# Patient Record
Sex: Female | Born: 1940 | Race: White | Hispanic: No | Smoking: Current every day smoker
Health system: Southern US, Community
[De-identification: ages and names within clinical notes are randomized; demographics above are authoritative.]

## PROBLEM LIST (undated history)

## (undated) DIAGNOSIS — M75101 Unspecified rotator cuff tear or rupture of right shoulder, not specified as traumatic: Secondary | ICD-10-CM

## (undated) DIAGNOSIS — K5900 Constipation, unspecified: Secondary | ICD-10-CM

## (undated) DIAGNOSIS — C801 Malignant (primary) neoplasm, unspecified: Secondary | ICD-10-CM

## (undated) DIAGNOSIS — M75102 Unspecified rotator cuff tear or rupture of left shoulder, not specified as traumatic: Secondary | ICD-10-CM

## (undated) DIAGNOSIS — H269 Unspecified cataract: Secondary | ICD-10-CM

## (undated) HISTORY — PX: LUMBAR LAMINECTOMY: SHX95

## (undated) HISTORY — DX: Unspecified cataract: H26.9

---
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
## 1977-10-08 HISTORY — PX: VAGINAL HYSTERECTOMY: SUR661

## 1981-10-08 HISTORY — PX: LUMBAR SPINE SURGERY: SHX701

## 2002-08-25 ENCOUNTER — Encounter: Payer: Self-pay | Admitting: Family Medicine

## 2002-08-25 ENCOUNTER — Encounter: Admission: RE | Admit: 2002-08-25 | Discharge: 2002-08-25 | Payer: Self-pay | Admitting: Family Medicine

## 2002-10-08 ENCOUNTER — Emergency Department (HOSPITAL_COMMUNITY): Admission: EM | Admit: 2002-10-08 | Discharge: 2002-10-08 | Payer: Self-pay | Admitting: Emergency Medicine

## 2010-10-08 HISTORY — PX: CATARACT EXTRACTION W/ INTRAOCULAR LENS  IMPLANT, BILATERAL: SHX1307

## 2010-11-21 ENCOUNTER — Other Ambulatory Visit: Payer: Self-pay | Admitting: Orthopedic Surgery

## 2010-11-21 ENCOUNTER — Encounter (HOSPITAL_COMMUNITY): Payer: Medicare Other

## 2010-11-21 DIAGNOSIS — Z01812 Encounter for preprocedural laboratory examination: Secondary | ICD-10-CM | POA: Insufficient documentation

## 2010-11-21 LAB — CBC
HCT: 39.5 % (ref 36.0–46.0)
Hemoglobin: 14.1 g/dL (ref 12.0–15.0)
MCH: 33.1 pg (ref 26.0–34.0)
MCHC: 35.7 g/dL (ref 30.0–36.0)
MCV: 92.7 fL (ref 78.0–100.0)
Platelets: 230 10*3/uL (ref 150–400)
RBC: 4.26 MIL/uL (ref 3.87–5.11)
RDW: 12.3 % (ref 11.5–15.5)
WBC: 8.9 10*3/uL (ref 4.0–10.5)

## 2010-11-21 LAB — SURGICAL PCR SCREEN
MRSA, PCR: NEGATIVE
Staphylococcus aureus: NEGATIVE

## 2010-11-28 ENCOUNTER — Inpatient Hospital Stay (HOSPITAL_COMMUNITY)
Admission: RE | Admit: 2010-11-28 | Discharge: 2010-11-29 | DRG: 497 | Disposition: A | Discharge status: Home / Self Care | Payer: Medicare Other | Source: Ambulatory Visit | Attending: Orthopedic Surgery | Admitting: Orthopedic Surgery

## 2010-11-28 ENCOUNTER — Inpatient Hospital Stay (HOSPITAL_COMMUNITY): Admission: RE | Admit: 2010-11-28 | Payer: Medicare Other | Source: Home / Self Care | Admitting: Orthopedic Surgery

## 2010-11-28 DIAGNOSIS — S43499A Other sprain of unspecified shoulder joint, initial encounter: Secondary | ICD-10-CM | POA: Diagnosis present

## 2010-11-28 DIAGNOSIS — M75120 Complete rotator cuff tear or rupture of unspecified shoulder, not specified as traumatic: Principal | ICD-10-CM | POA: Diagnosis present

## 2010-11-28 DIAGNOSIS — Z01812 Encounter for preprocedural laboratory examination: Secondary | ICD-10-CM

## 2010-11-28 DIAGNOSIS — F17200 Nicotine dependence, unspecified, uncomplicated: Secondary | ICD-10-CM | POA: Diagnosis present

## 2010-11-28 DIAGNOSIS — M19019 Primary osteoarthritis, unspecified shoulder: Secondary | ICD-10-CM | POA: Diagnosis present

## 2010-11-28 HISTORY — PX: OTHER SURGICAL HISTORY: SHX169

## 2010-12-01 NOTE — Op Note (Signed)
  NAME:  Brittany Marks, Brittany Marks             ACCOUNT NO.:  [id number]  MEDICAL RECORD NO.:  [id number]           PATIENT TYPE:  I  LOCATION:  1619                         FACILITY:  [facility]  PHYSICIAN:  Marlowe Kays, M.D.  DATE OF BIRTH:  1940-11-06  DATE OF PROCEDURE: DATE OF DISCHARGE:                              OPERATIVE REPORT   ADDENDUM:  After conclusion of the rotator cuff repair on the right shoulder, following a Betadine prep, I then injected the subacromial space on her left shoulder with 80 mg of Depo-Medrol and 5 cc of 0.5% Marcaine plain without a complication.          ______________________________ Marlowe Kays, M.D.     JA/MEDQ  D:  11/28/2010  T:  11/29/2010  Job:  [id number]  Electronically Signed by Marlowe Kays M.D. on 12/01/2010 04:26:34 PM

## 2010-12-01 NOTE — Op Note (Signed)
[name], Brittany Marks             ACCOUNT NO.:  [id number]  MEDICAL RECORD NO.:  [id number]           PATIENT TYPE:  I  LOCATION:  1619                         FACILITY:  [facility]  PHYSICIAN:  Marlowe Kays, M.D.  DATE OF BIRTH:  1941/09/04  DATE OF PROCEDURE:  11/28/2010 DATE OF DISCHARGE:                              OPERATIVE REPORT   PREOPERATIVE DIAGNOSES: 1. Torn labrum. 2. Partial biceps tendon tear, intra-articular versus extra-articular. 3. Complete rotator cuff tear with retraction. 4. Acromioclavicular joint arthritis, right shoulder.  POSTOPERATIVE DIAGNOSES: 1. Torn labrum. 2. Partial biceps tendon tear, intra-articular versus extra-articular. 3. Complete rotator cuff tear with retraction. 4. Acromioclavicular joint arthritis, right shoulder.  OPERATION: 1. Right shoulder arthroscopy with debridement of extensive tearing of     the labrum and debridement/resection of residual intra-articular     biceps tendon tear. 2. Open distal clavicle resection. 3. Open anterior acromionectomy and repair of torn rotator cuff.  SURGEON:  Marlowe Kays, M.D.  ASSISTANTDruscilla Brownie. Cherlynn June.  ANESTHESIA:  General.  PATHOLOGY/JUSTIFICATION FOR PROCEDURE:  All the injuries to her shoulder occurred following a fall with an MRI demonstrating preoperative diagnoses.  Also, at the time of surgery, she requested that I inject her left shoulder with steroids because it was painful as well.  DESCRIPTION OF PROCEDURE:  Preoperative interscalene block by Anesthesia, satisfactory general anesthesia, beach-chair position on sliding frame.  Right shoulder was girdle was prepped with DuraPrep and draped in sterile field.  Time-out performed.  I marked out the anatomy of the shoulder and for hemostatic purposes I infiltrated the proposed surgical incision site and posterior portal with 0.5% Marcaine with adrenaline.  Through a posterior soft spot portal, I entered  the glenohumeral joint.  She had an unusual amount of bleeding on the puncture, which I felt was atraumatic, but she had a large amount of vascular tissue in the joint, which was quite distorted by her pathology.  It was really hard to make out definite tissue planes.  I was able identify the humeral head and the adjacent labrum.  I could not see a definite biceps tendon, although there was a big water tissue at the location where the biceps would having attached.  Consequently, I estimated the appropriate placement to advance the scope anteriorly and with a Switching Stick made an anterior incision.  I then placed a 4.2 shaver into the joint and then began cleaning up the very distorted tissue including the very degenerated torn labrum and as it turns out remnant of the biceps tendon.  I then used a 4.2 [place] shaver for more aggressiveness.  I completed the resection/removal and there was no longer any mechanical impingement in the joint.  I then exited the glenohumeral joint removing all fluid possible and made an open incision over the distal clavicle and curving downward over the rotator cuff. The ceased joint was identified and subperiosteal dissection.  I isolated the distal clavicle and measured a 1.5 cm from the joint.  With small Key elevator, I undermined the clavicle at this point and using two baby Hohmann retractors, I protected the underlying tissue as  I used a microsaw to amputate the clavicle at this point.  Small spicules of remaining bone are removed with small rongeur.  I then covered the raw bone with bone wax.  I then continued distally with subperiosteal dissection using cutting cautery isolating the anterior acromion, which I then undermined with a small Cobb elevator and did my initial bony resection with a microsaw.  I followed this with a second minor bone resections that any impingement problems would completely eradicated.  I then inspected and found the  rotator cuff, which was as noted on the MRI significantly retracted.  It was also quite thinned out.  A nubbin biceps tendon was also noted over the posterior half underneath the rotator cuff and I extracted that with a small rongeur.  She was also noted to have a number of cystic changes in the craters in the lateral humeral head, which I also pictured.  I then began packing down the rotator cuff starting from anteriorly using multiple rotator cuff anchors.  I was unable to reattach the rotator cuff at its normal anatomic location, but it was under minimal tension just medial to it. The big concern was the integrity of the rotator cuff.  Having reattached the rotator cuff, we irrigated the wounds well.  I then placed Gelfoam in the resection site of the distal clavicle and then closed the wound with interrupted #1 Vicryl and the fascia overlying the distal clavicle resection and over the anterior acromion as well as repair of the deltoid.  Subcutaneous tissue was then closed with combination of a 1-0 and 2-0 Vicryl and Steri-Strips on the skin.  Also, after the arthroscopic procedure, I closed these 2 portals with 4-0 nylon and applied an Ioban, which was removed at this time as well.  Dry sterile dressing applied followed by shoulder immobilizer.  She tolerated the procedure well and was taken to Recovery in satisfactory condition with no known complications.          ______________________________ Marlowe Kays, M.D.     JA/MEDQ  D:  11/28/2010  T:  11/29/2010  Job:  [id number]  Electronically Signed by Marlowe Kays M.D. on 12/01/2010 04:26:31 PM

## 2011-01-09 NOTE — Discharge Summary (Signed)
  NAMEALLENA, Brittany Marks             ACCOUNT NO.:  [id number]  MEDICAL RECORD NO.:  [id number]           PATIENT TYPE:  I  LOCATION:  1619                         FACILITY:  [facility]  PHYSICIAN:  Marlowe Kays, M.D.  DATE OF BIRTH:  1940-11-12  DATE OF ADMISSION:  11/28/2010 DATE OF DISCHARGE:  11/29/2010                              DISCHARGE SUMMARY   ADMITTING DIAGNOSES: 1. Torn labrum, right shoulder. 2. Partial biceps tendon tear, intraarticular versus extraarticular. 3. Complete rotator cuff tear with retraction. 4. Acromioclavicular joint arthritis, right shoulder.  DISCHARGE DIAGNOSES: 1. Torn labrum, right shoulder. 2. Partial biceps tendon tear, intraarticular versus extraarticular. 3. Complete rotator cuff tear with retraction. 4. Acromioclavicular joint arthritis, right shoulder.  OPERATIONS: 1. On November 28, 2010, the patient underwent right shoulder     arthroscopy with debridement of extensive tearing of the labrum and     debridement resection of residual intra-articular biceps tendon     tear. 2. Open distal clavicle resection. 3. Open anterior acromionectomy and repair of torn rotator cuff.  ASSISTANT:  Dooley L. Cherlynn June.  BRIEF HISTORY:  The patient had a fall in the not too distant past and MRI showed the preoperative diagnosis listed above.  At the time of the surgery, we injected her left shoulder for its pain as well.  COURSE IN HOSPITAL:  The patient tolerated overnight stay.  Vital signs remained intact and stable.  She was placed in a sling with waist stabilizer, which she tolerated quite well.  On the morning of discharge, she showed good neurovascular in the operative extremity.  It is felt, she could be maintained in a home environment, arrangements were made for discharge.  She continue with her home medications, which was ibuprofen 200 mg 3 times a day, diclofenac 75 mg p.o. twice daily, and tramadol 50 mg q.6 h. as needed.  She was  discharged to home in good condition, to follow in their office in 2-3 days for dressing change and then 2 weeks after that.  All questions encouraged and answered.     Dooley L. Cherlynn June.   ______________________________ Marlowe Kays, M.D.    DLU/MEDQ  D:  12/28/2010  T:  12/29/2010  Job:  [id number]  Electronically Signed by Marlowe Kays M.D. on 01/09/2011 03:38:38 PM

## 2011-01-10 ENCOUNTER — Other Ambulatory Visit: Payer: Self-pay | Admitting: Orthopedic Surgery

## 2011-01-10 ENCOUNTER — Ambulatory Visit (HOSPITAL_BASED_OUTPATIENT_CLINIC_OR_DEPARTMENT_OTHER)
Admission: RE | Admit: 2011-01-10 | Discharge: 2011-01-10 | Disposition: A | Discharge status: Home / Self Care | Payer: Federal, State, Local not specified - PPO | Source: Ambulatory Visit | Attending: Orthopedic Surgery | Admitting: Orthopedic Surgery

## 2011-01-10 DIAGNOSIS — F17200 Nicotine dependence, unspecified, uncomplicated: Secondary | ICD-10-CM | POA: Insufficient documentation

## 2011-01-10 DIAGNOSIS — I998 Other disorder of circulatory system: Secondary | ICD-10-CM | POA: Insufficient documentation

## 2011-01-10 DIAGNOSIS — Y838 Other surgical procedures as the cause of abnormal reaction of the patient, or of later complication, without mention of misadventure at the time of the procedure: Secondary | ICD-10-CM | POA: Insufficient documentation

## 2011-01-10 DIAGNOSIS — Z01812 Encounter for preprocedural laboratory examination: Secondary | ICD-10-CM | POA: Insufficient documentation

## 2011-01-10 DIAGNOSIS — T8130XA Disruption of wound, unspecified, initial encounter: Secondary | ICD-10-CM | POA: Insufficient documentation

## 2011-01-10 HISTORY — PX: OTHER SURGICAL HISTORY: SHX169

## 2011-01-11 LAB — POCT HEMOGLOBIN-HEMACUE: Hemoglobin: 14.5 g/dL (ref 12.0–15.0)

## 2011-01-12 LAB — WOUND CULTURE: Culture: NO GROWTH

## 2011-01-15 LAB — ANAEROBIC CULTURE

## 2011-01-24 NOTE — Op Note (Signed)
  NAMEALEXARAE, Brittany Marks             ACCOUNT NO.:  [id number]  MEDICAL RECORD NO.:  [id number]           PATIENT TYPE:  LOCATION:                                 FACILITY:  PHYSICIAN:  Marlowe Kays, M.D.  DATE OF BIRTH:  [birth year]  DATE OF PROCEDURE:  01/10/2011 DATE OF DISCHARGE:                              OPERATIVE REPORT   PREOPERATIVE DIAGNOSIS:  Aseptic wound dehisced, superior right shoulder.  POSTOPERATIVE DIAGNOSIS:  Aseptic wound dehisced, superior right shoulder.  OPERATION:  Debridement, irrigation, and closure of wounds, superior right shoulder.  SURGEON:  Marlowe Kays, MD  ASSISTANT:  Nurse.  ANESTHESIA:  General.  JUSTIFICATION FOR PROCEDURE:  I performed open rotator cuff repair as well as distal clavicle excision on November 28, 2010.  Over the last week, we noted a blister formed over the lateral portion of the incision which on opening sterilely with a needle, little bit of fluid came forth which we have cultured and has been negative so far, but she has had significant persistent drainage and I was able to palpate a gap in the deeper tissue structures leading to her coming here today.  PROCEDURE IN DETAIL:  After satisfied general anesthesia, she was noted to have a large resolving area of ecchymosis on her lateral left shoulder where she is known to have a rotator cuff tear there as well. Beach-chair position, right shoulder girdle was prepped with DuraPrep and draped in sterile field.  All this was done with her arm to her side.  A time-out performed, I then draped throughout and squared off fashion and excised the area of the wound which had soft tissue coming forth out of it and we sent this specimen to Microbiology for culture. I followed the wound down debriding up with sharp dissection and small rongeur down to what appeared to be the underlying acromion.  Again there did not appear to be any purulent material.  The wound was seen to be  sealed off laterally towards the rotator cuff area of repair.  After irrigating the wound thoroughly, I closed the deep structures with interrupted #1 Vicryl, subcutaneous tissue with 2-0 Vicryl, and interrupted 4-0 nylon sutures in the skin. Betadine, Adaptic, dry sterile dressings were applied followed by shoulder immobilizer, again protecting her arm for any motion.  At the time of this dictation, she was on her way to recovery room in satisfactory condition with no known complications.          ______________________________ Marlowe Kays, M.D.     JA/MEDQ  D:  01/10/2011  T:  01/11/2011  Job:  [id number]  Electronically Signed by Marlowe Kays M.D. on 01/24/2011 07:35:10 AM

## 2011-04-17 NOTE — H&P (Addendum)
[name], Marks             ACCOUNT NO.:  [id number]  MEDICAL RECORD NO.:  [id number]          PATIENT TYPE:  AMB  LOCATION:  DAY                          FACILITY:  [facility]  PHYSICIAN:  Brittany Marks, M.D.  DATE OF BIRTH:  [birth year]  DATE OF ADMISSION: DATE OF DISCHARGE:                             HISTORY & PHYSICAL   CHIEF COMPLAINT:  "Pain in my right shoulder."  PRESENT ILLNESS:  This 70 year old white female has been seen by [place] for progressive and persistent problems concerning her right shoulder.  She also has similar problems with the left shoulder, but the right is far more symptomatic.  She has pain with range of motion with difficulty reaching out and up.  The MRI of September 23, 2010 showed a considerable amount of pathology in the shoulder.  She has a full and partial- thickness, humeral chondral loss, degenerative labral tear with a high- grade partial full-thickness long head of the biceps tear as well.  A 3.8 anterior to posterior full thickness rotator cuff tear was seen in the right shoulder with 3.7 cm of retraction.  She also has moderate AC joint hypertrophy.  Due to the severity of her current disease, it was felt that she would need a surgical procedure.  She agreed because this will probability not heal by itself and certainly has not to this point. She is being scheduled for a right shoulder scope with repair of the rotator cuff, distal clavicle resection, subacromial decompression as well.  All the risks and benefits of surgery have been described to her.  When seen today, she had a considerable amount of questions concerning the para surgical plans.  I attempted to answer as many as I could.  PAST MEDICAL HISTORY:  She has been in relatively good health throughout her lifetime.  She has no medical allergies.  CURRENT MEDICATIONS: 1. Diclofenac (which she will stop prior to surgery) alternating with     ibuprofen (which she will stop prior to  surgery). 2. She also uses tramadol for pain which seems to help her     considerably.  PAST SURGICAL HISTORY: 1. She has had bilateral cataract surgery. 2. Back surgery back in the 70s and done well with that. 3. She has also had a hysterectomy in 1979. 4. She had a different level of lumbar surgery done in December 1983. 5. She had mumps and measles as a child.  FAMILY HISTORY:  Positive heart disease with father dying at 50 years of age, heart disease with the mother dying in 61 years of age.  SOCIAL HISTORY:  The patient is divorced, retired.  She is attempting to stop smoking as she has smoked for 40+ years.  (I reminded her about healing interference with smokers).  The patient lives alone but she has a daughter and her friend to help her after surgery.  REVIEW OF SYSTEMS:  CNS:  No seizures, stroke, paralysis, numbness, or double vision.  RESPIRATORY:  No productive cough.  No hemoptysis or shortness of breath.  CARDIOVASCULAR:  No chest pain or angina or orthopnea.  GASTROINTESTINAL:  No nausea, vomiting, melena, or bloody stool.  GENITOURINARY:  No discharge, dysuria, or hematuria. MUSCULOSKELETAL:  Primarily in present illness with pain on range of motion of the right shoulder.  PHYSICAL EXAMINATION:  GENERAL:  Alert and cooperative friendly 70 year old, 5 feet 2-1/2 inch, 164 pounds white female accompanied by her daughter. VITAL SIGNS:  Blood pressure 120/58 left arm seated, pulses 70 and regular, respirations 12 and unlabored. HEENT:  Normocephalic.  PERRLA.  EOM  intact.  Oropharynx is clear. CHEST:  Clear to auscultation.  No rhonchi or rales. HEART:  Regular rate and rhythm.  No murmurs are heard. ABDOMEN:  Soft, nontender.  Liver and spleen not felt. GENITALIA,  RECTAL,  PELVIC,  BREASTS:  Not done, per present illness. EXTREMITIES:  Right shoulder seen with pain on range of motion particularly on abduction.  Good pulses and sensation to the hand.  ADMISSION  DIAGNOSIS: 1. Right shoulder degenerative osteoarthritis. 2. Tear of the rotator cuff. 3. Impingement with acromioclavicular arthrosis to the right shoulder.  PLAN:  We will do an open rotator cuff repair, distal clavicle resection.  She will also have arthroscopy with debridement of the labrum and biceps tendon.     Brittany Marks.   ______________________________ Brittany Marks, M.D.    DLU/MEDQ  D:  11/08/2010  T:  11/08/2010  Job:  [id number]  Electronically Signed by Brittany Marks M.D. on 11/13/2010 02:49:09 PM

## 2011-11-14 ENCOUNTER — Other Ambulatory Visit: Payer: Self-pay | Admitting: Orthopedic Surgery

## 2011-11-20 ENCOUNTER — Encounter (HOSPITAL_BASED_OUTPATIENT_CLINIC_OR_DEPARTMENT_OTHER): Payer: Self-pay | Admitting: *Deleted

## 2011-11-21 ENCOUNTER — Encounter (HOSPITAL_BASED_OUTPATIENT_CLINIC_OR_DEPARTMENT_OTHER): Payer: Self-pay | Admitting: *Deleted

## 2011-11-21 NOTE — Progress Notes (Signed)
NPO AFTER MN. ARRIVES AT 1030. NEEDS HH AND EKG.

## 2011-11-27 ENCOUNTER — Ambulatory Visit (HOSPITAL_BASED_OUTPATIENT_CLINIC_OR_DEPARTMENT_OTHER)
Admission: RE | Admit: 2011-11-27 | Payer: Federal, State, Local not specified - PPO | Source: Ambulatory Visit | Admitting: Orthopedic Surgery

## 2011-11-27 ENCOUNTER — Encounter (HOSPITAL_BASED_OUTPATIENT_CLINIC_OR_DEPARTMENT_OTHER): Admission: RE | Payer: Self-pay | Source: Ambulatory Visit

## 2011-11-27 HISTORY — DX: Unspecified rotator cuff tear or rupture of right shoulder, not specified as traumatic: M75.101

## 2011-11-27 SURGERY — REPAIR, ROTATOR CUFF, OPEN
Anesthesia: General | Laterality: Right

## 2011-12-05 ENCOUNTER — Other Ambulatory Visit: Payer: Self-pay | Admitting: Orthopedic Surgery

## 2011-12-13 NOTE — Progress Notes (Signed)
NPO AFTER MN. ARRIVES AT 1030. NEEDS HG AND EKG. PT STATES IS NOT OWER.

## 2011-12-19 ENCOUNTER — Encounter (HOSPITAL_BASED_OUTPATIENT_CLINIC_OR_DEPARTMENT_OTHER): Payer: Self-pay | Admitting: *Deleted

## 2011-12-19 ENCOUNTER — Other Ambulatory Visit: Payer: Self-pay

## 2011-12-19 ENCOUNTER — Encounter (HOSPITAL_BASED_OUTPATIENT_CLINIC_OR_DEPARTMENT_OTHER)
Admission: RE | Disposition: A | Discharge status: Home / Self Care | Payer: Self-pay | Source: Ambulatory Visit | Attending: Orthopedic Surgery

## 2011-12-19 ENCOUNTER — Encounter (HOSPITAL_BASED_OUTPATIENT_CLINIC_OR_DEPARTMENT_OTHER): Payer: Self-pay | Admitting: Anesthesiology

## 2011-12-19 ENCOUNTER — Ambulatory Visit (HOSPITAL_BASED_OUTPATIENT_CLINIC_OR_DEPARTMENT_OTHER)
Admission: RE | Admit: 2011-12-19 | Discharge: 2011-12-19 | Disposition: A | Discharge status: Home / Self Care | Payer: Medicare Other | Source: Ambulatory Visit | Attending: Orthopedic Surgery | Admitting: Orthopedic Surgery

## 2011-12-19 ENCOUNTER — Ambulatory Visit (HOSPITAL_BASED_OUTPATIENT_CLINIC_OR_DEPARTMENT_OTHER): Payer: Medicare Other | Admitting: Anesthesiology

## 2011-12-19 DIAGNOSIS — F17200 Nicotine dependence, unspecified, uncomplicated: Secondary | ICD-10-CM | POA: Insufficient documentation

## 2011-12-19 DIAGNOSIS — M719 Bursopathy, unspecified: Secondary | ICD-10-CM | POA: Insufficient documentation

## 2011-12-19 DIAGNOSIS — Z79899 Other long term (current) drug therapy: Secondary | ICD-10-CM | POA: Insufficient documentation

## 2011-12-19 DIAGNOSIS — M67919 Unspecified disorder of synovium and tendon, unspecified shoulder: Secondary | ICD-10-CM | POA: Insufficient documentation

## 2011-12-19 HISTORY — PX: SHOULDER OPEN ROTATOR CUFF REPAIR: SHX2407

## 2011-12-19 LAB — POCT I-STAT, CHEM 8
Creatinine, Ser: 0.7 mg/dL (ref 0.50–1.10)
Hemoglobin: 14.6 g/dL (ref 12.0–15.0)
Potassium: 3.9 mEq/L (ref 3.5–5.1)
Sodium: 144 mEq/L (ref 135–145)

## 2011-12-19 SURGERY — REPAIR, ROTATOR CUFF, OPEN
Anesthesia: General | Site: Shoulder | Laterality: Right | Wound class: Clean

## 2011-12-19 MED ORDER — EPHEDRINE SULFATE 50 MG/ML IJ SOLN
INTRAMUSCULAR | Status: DC | PRN
  Administered 2011-12-19: 15 mg via INTRAVENOUS
  Administered 2011-12-19: 10 mg via INTRAVENOUS
  Administered 2011-12-19: 15 mg via INTRAVENOUS

## 2011-12-19 MED ORDER — SODIUM CHLORIDE 0.9 % IR SOLN
Status: DC | PRN
  Administered 2011-12-19: 14:00:00

## 2011-12-19 MED ORDER — DEXAMETHASONE SODIUM PHOSPHATE 4 MG/ML IJ SOLN
INTRAMUSCULAR | Status: DC | PRN
  Administered 2011-12-19: 10 mg via INTRAVENOUS

## 2011-12-19 MED ORDER — MENTHOL 3 MG MT LOZG: 1.0000 | LOZENGE | OROMUCOSAL | Status: DC | PRN

## 2011-12-19 MED ORDER — LIDOCAINE HCL (CARDIAC) 20 MG/ML IV SOLN
INTRAVENOUS | Status: DC | PRN
  Administered 2011-12-19: 80 mg via INTRAVENOUS

## 2011-12-19 MED ORDER — CEFAZOLIN SODIUM 1-5 GM-% IV SOLN
1.0000 g | INTRAVENOUS | Status: AC
  Administered 2011-12-19: 1 g via INTRAVENOUS

## 2011-12-19 MED ORDER — POVIDONE-IODINE 7.5 % EX SOLN: Freq: Once | CUTANEOUS | Status: DC

## 2011-12-19 MED ORDER — SODIUM CHLORIDE 0.9 % IV SOLN: INTRAVENOUS | Status: DC

## 2011-12-19 MED ORDER — METHOCARBAMOL 500 MG PO TABS: 500.0000 mg | ORAL_TABLET | Freq: Four times a day (QID) | ORAL | Status: DC | PRN

## 2011-12-19 MED ORDER — ACETAMINOPHEN 650 MG RE SUPP: 650.0000 mg | Freq: Four times a day (QID) | RECTAL | Status: DC | PRN

## 2011-12-19 MED ORDER — ONDANSETRON HCL 4 MG/2ML IJ SOLN: 4.0000 mg | Freq: Four times a day (QID) | INTRAMUSCULAR | Status: DC | PRN

## 2011-12-19 MED ORDER — HYDROMORPHONE HCL PF 1 MG/ML IJ SOLN: 0.5000 mg | INTRAMUSCULAR | Status: DC | PRN

## 2011-12-19 MED ORDER — MIDAZOLAM HCL 2 MG/2ML IJ SOLN
1.0000 mg | Freq: Once | INTRAMUSCULAR | Status: AC
  Administered 2011-12-19: 1 mg via INTRAVENOUS

## 2011-12-19 MED ORDER — METHOCARBAMOL 500 MG PO TABS: 500.0000 mg | ORAL_TABLET | Freq: Four times a day (QID) | ORAL | Status: AC | PRN

## 2011-12-19 MED ORDER — ACETAMINOPHEN 325 MG PO TABS: 650.0000 mg | ORAL_TABLET | Freq: Four times a day (QID) | ORAL | Status: DC | PRN

## 2011-12-19 MED ORDER — METOCLOPRAMIDE HCL 5 MG/ML IJ SOLN: 5.0000 mg | Freq: Three times a day (TID) | INTRAMUSCULAR | Status: DC | PRN

## 2011-12-19 MED ORDER — FENTANYL CITRATE 0.05 MG/ML IJ SOLN
INTRAMUSCULAR | Status: DC | PRN
  Administered 2011-12-19: 50 ug via INTRAVENOUS

## 2011-12-19 MED ORDER — PROMETHAZINE HCL 25 MG/ML IJ SOLN: 6.2500 mg | INTRAMUSCULAR | Status: DC | PRN

## 2011-12-19 MED ORDER — HYDROCODONE-ACETAMINOPHEN 5-325 MG PO TABS: 1.0000 | ORAL_TABLET | ORAL | Status: DC | PRN

## 2011-12-19 MED ORDER — HYDROMORPHONE HCL PF 1 MG/ML IJ SOLN: 0.2500 mg | INTRAMUSCULAR | Status: DC | PRN

## 2011-12-19 MED ORDER — KETOROLAC TROMETHAMINE 30 MG/ML IJ SOLN: 15.0000 mg | Freq: Once | INTRAMUSCULAR | Status: AC | PRN

## 2011-12-19 MED ORDER — LACTATED RINGERS IV SOLN
INTRAVENOUS | Status: DC
  Administered 2011-12-19 (×3): via INTRAVENOUS

## 2011-12-19 MED ORDER — ROPIVACAINE HCL 5 MG/ML IJ SOLN
INTRAMUSCULAR | Status: DC | PRN
  Administered 2011-12-19: 20 mL

## 2011-12-19 MED ORDER — ONDANSETRON HCL 4 MG PO TABS: 4.0000 mg | ORAL_TABLET | Freq: Four times a day (QID) | ORAL | Status: DC | PRN

## 2011-12-19 MED ORDER — FENTANYL CITRATE 0.05 MG/ML IJ SOLN
50.0000 ug | INTRAMUSCULAR | Status: DC | PRN
  Administered 2011-12-19: 50 ug via INTRAVENOUS

## 2011-12-19 MED ORDER — PHENOL 1.4 % MT LIQD: 1.0000 | OROMUCOSAL | Status: DC | PRN

## 2011-12-19 MED ORDER — METOCLOPRAMIDE HCL 5 MG PO TABS: 5.0000 mg | ORAL_TABLET | Freq: Three times a day (TID) | ORAL | Status: DC | PRN

## 2011-12-19 MED ORDER — SUCCINYLCHOLINE CHLORIDE 20 MG/ML IJ SOLN
INTRAMUSCULAR | Status: DC | PRN
  Administered 2011-12-19: 140 mg via INTRAVENOUS

## 2011-12-19 MED ORDER — DEXTROSE 5 % IV SOLN: 500.0000 mg | Freq: Four times a day (QID) | INTRAVENOUS | Status: DC | PRN

## 2011-12-19 MED ORDER — OXYCODONE-ACETAMINOPHEN 5-325 MG PO TABS: 1.0000 | ORAL_TABLET | ORAL | Status: AC | PRN

## 2011-12-19 MED ORDER — PROPOFOL 10 MG/ML IV EMUL
INTRAVENOUS | Status: DC | PRN
  Administered 2011-12-19: 220 mg via INTRAVENOUS

## 2011-12-19 MED ORDER — OXYCODONE-ACETAMINOPHEN 5-325 MG PO TABS: 1.0000 | ORAL_TABLET | ORAL | Status: DC | PRN

## 2011-12-19 SURGICAL SUPPLY — 57 items
ANCHOR PEEK ZIP 5.5 NDL NO2 (Orthopedic Implant) ×2 IMPLANT
BENZOIN TINCTURE PRP APPL 2/3 (GAUZE/BANDAGES/DRESSINGS) ×2 IMPLANT
BLADE FLAT COURSE (BLADE) ×2 IMPLANT
BLADE SURG 10 STRL SS (BLADE) ×4 IMPLANT
BLADE SURG 15 STRL LF DISP TIS (BLADE) ×1 IMPLANT
BLADE SURG 15 STRL SS (BLADE) ×1
CANISTER SUCTION 1200CC (MISCELLANEOUS) IMPLANT
CANISTER SUCTION 2500CC (MISCELLANEOUS) ×4 IMPLANT
CLEANER CAUTERY TIP 5X5 PAD (MISCELLANEOUS) ×1 IMPLANT
CLOTH BEACON ORANGE TIMEOUT ST (SAFETY) ×2 IMPLANT
DRAPE INCISE IOBAN 66X45 STRL (DRAPES) ×2 IMPLANT
DRAPE LG THREE QUARTER DISP (DRAPES) ×4 IMPLANT
DRAPE ORTHO SPLIT 77X108 STRL (DRAPES) ×2
DRAPE SHOULDER BEACH CHAIR (DRAPES) ×2 IMPLANT
DRAPE SURG ORHT 6 SPLT 77X108 (DRAPES) ×2 IMPLANT
DRAPE U-SHAPE 47X51 STRL (DRAPES) ×2 IMPLANT
DRSG ADAPTIC 3X8 NADH LF (GAUZE/BANDAGES/DRESSINGS) ×2 IMPLANT
DRSG PAD ABDOMINAL 8X10 ST (GAUZE/BANDAGES/DRESSINGS) ×2 IMPLANT
DURAPREP 26ML APPLICATOR (WOUND CARE) ×2 IMPLANT
ELECT REM PT RETURN 9FT ADLT (ELECTROSURGICAL) ×2
ELECTRODE REM PT RTRN 9FT ADLT (ELECTROSURGICAL) ×1 IMPLANT
GLOVE BIOGEL PI IND STRL 8 (GLOVE) ×1 IMPLANT
GLOVE BIOGEL PI INDICATOR 8 (GLOVE) ×1
GLOVE ECLIPSE 8.0 STRL XLNG CF (GLOVE) ×6 IMPLANT
GLOVE INDICATOR 8.0 STRL GRN (GLOVE) ×2 IMPLANT
GOWN PREVENTION PLUS LG XLONG (DISPOSABLE) ×2 IMPLANT
GOWN STRL REIN XL XLG (GOWN DISPOSABLE) ×4 IMPLANT
NEEDLE 1/2 CIR CATGUT .05X1.09 (NEEDLE) IMPLANT
NS IRRIG 500ML POUR BTL (IV SOLUTION) IMPLANT
PACK ARTHROSCOPY DSU (CUSTOM PROCEDURE TRAY) ×2 IMPLANT
PACK BASIN DAY SURGERY FS (CUSTOM PROCEDURE TRAY) ×2 IMPLANT
PAD CLEANER CAUTERY TIP 5X5 (MISCELLANEOUS) ×1
PENCIL BUTTON HOLSTER BLD 10FT (ELECTRODE) ×2 IMPLANT
SLING ARM IMMOBILIZER LRG (SOFTGOODS) ×4 IMPLANT
SLING ARM IMMOBILIZER MED (SOFTGOODS) IMPLANT
SPONGE GAUZE 4X4 12PLY (GAUZE/BANDAGES/DRESSINGS) ×2 IMPLANT
SPONGE LAP 4X18 X RAY DECT (DISPOSABLE) ×4 IMPLANT
SPONGE SURGIFOAM ABS GEL 100 (HEMOSTASIS) IMPLANT
STAPLER VISISTAT 35W (STAPLE) IMPLANT
STRIP CLOSURE SKIN 1/2X4 (GAUZE/BANDAGES/DRESSINGS) ×2 IMPLANT
SUCTION FRAZIER TIP 10 FR DISP (SUCTIONS) ×2 IMPLANT
SUT BONE WAX W31G (SUTURE) IMPLANT
SUT ETHIBOND GREEN BRAID 0S 4 (SUTURE) IMPLANT
SUT ETHILON 4 0 PS 2 18 (SUTURE) ×2 IMPLANT
SUT VIC AB 0 CT1 36 (SUTURE) ×2 IMPLANT
SUT VIC AB 1 CT1 36 (SUTURE) ×2 IMPLANT
SUT VIC AB 2-0 CT1 27 (SUTURE) ×1
SUT VIC AB 2-0 CT1 TAPERPNT 27 (SUTURE) ×1 IMPLANT
SUT VIC AB 3-0 SH 27 (SUTURE)
SUT VIC AB 3-0 SH 27X BRD (SUTURE) IMPLANT
SUT VICRYL 4-0 PS2 18IN ABS (SUTURE) IMPLANT
SYR BULB IRRIGATION 50ML (SYRINGE) ×2 IMPLANT
TAPE CLOTH SURG 6X10 WHT LF (GAUZE/BANDAGES/DRESSINGS) ×2 IMPLANT
TAPE HYPAFIX 6X30 (GAUZE/BANDAGES/DRESSINGS) IMPLANT
TISSUEMEND ×2 IMPLANT
TOWEL OR 17X24 6PK STRL BLUE (TOWEL DISPOSABLE) ×2 IMPLANT
WATER STERILE IRR 500ML POUR (IV SOLUTION) ×2 IMPLANT

## 2011-12-19 NOTE — Discharge Instructions (Addendum)
Call for appt.to be seen on Friday. DO NOT move right shoulder. Keep sling on it all times. May loosen for dressing.use ice to shoulder as needed.

## 2011-12-19 NOTE — Anesthesia Postprocedure Evaluation (Signed)
  Anesthesia Post-op Note  Patient: Brittany Marks  Procedure(s) Performed: Procedure(s) (LRB): ROTATOR CUFF REPAIR SHOULDER OPEN (Right)  Patient Location: PACU  Anesthesia Type: GA combined with regional for post-op pain  Level of Consciousness: awake and alert   Airway and Oxygen Therapy: Patient Spontanous Breathing  Post-op Pain: mild  Post-op Assessment: Post-op Vital signs reviewed, Patient's Cardiovascular Status Stable, Respiratory Function Stable, Patent Airway and No signs of Nausea or vomiting  Post-op Vital Signs: stable  Complications: No apparent anesthesia complications

## 2011-12-19 NOTE — Anesthesia Preprocedure Evaluation (Signed)
Anesthesia Evaluation  Patient identified by MRN, date of birth, ID band Patient awake    Reviewed: Allergy & Precautions, H&P , NPO status , Patient's Chart, lab work & pertinent test results  Airway Mallampati: II TM Distance: <3 FB Neck ROM: Full    Dental No notable dental hx.    Pulmonary Current Smoker,  breath sounds clear to auscultation  Pulmonary exam normal       Cardiovascular negative cardio ROS  Rhythm:Regular Rate:Normal     Neuro/Psych negative neurological ROS  negative psych ROS   GI/Hepatic negative GI ROS, Neg liver ROS,   Endo/Other  negative endocrine ROS  Renal/GU negative Renal ROS  negative genitourinary   Musculoskeletal negative musculoskeletal ROS (+)   Abdominal   Peds negative pediatric ROS (+)  Hematology negative hematology ROS (+)   Anesthesia Other Findings   Reproductive/Obstetrics negative OB ROS                           Anesthesia Physical Anesthesia Plan  ASA: II  Anesthesia Plan: General   Post-op Pain Management:    Induction: Intravenous  Airway Management Planned: Oral ETT  Additional Equipment:   Intra-op Plan:   Post-operative Plan: Extubation in OR  Informed Consent: I have reviewed the patients History and Physical, chart, labs and discussed the procedure including the risks, benefits and alternatives for the proposed anesthesia with the patient or authorized representative who has indicated his/her understanding and acceptance.   Dental advisory given  Plan Discussed with: CRNA  Anesthesia Plan Comments:         Anesthesia Quick Evaluation  

## 2011-12-19 NOTE — Anesthesia Procedure Notes (Addendum)
Anesthesia Regional Block:  Interscalene brachial plexus block  Pre-Anesthetic Checklist: ,, timeout performed, Correct Patient, Correct Site, Correct Laterality, Correct Procedure, Correct Position, site marked, Risks and benefits discussed,  Surgical consent,  Pre-op evaluation,  At surgeon's request and post-op pain management  Laterality: Right  Prep: chloraprep       Needles:  Injection technique: Single-shot  Needle Type: Echogenic Needle          Additional Needles:  Procedures: ultrasound guided Interscalene brachial plexus block Narrative:  Start time: 12/19/2011 12:10 PM End time: 12/19/2011 12:17 PM Injection made incrementally with aspirations every 5 mL.  Performed by: Personally  Anesthesiologist: Eilene Ghazi MD  Additional Notes: Patient tolerated the procedure well without complications  Interscalene brachial plexus block Procedure Name: Intubation Date/Time: 12/19/2011 1:30 PM Performed by: Fran Lowes Pre-anesthesia Checklist: Patient identified, Emergency Drugs available, Suction available and Patient being monitored Patient Re-evaluated:Patient Re-evaluated prior to inductionOxygen Delivery Method: Circle System Utilized Preoxygenation: Pre-oxygenation with 100% oxygen Intubation Type: IV induction Ventilation: Mask ventilation without difficulty Laryngoscope Size: Miller and 3 Grade View: Grade I Tube type: Oral Tube size: 7.0 mm Number of attempts: 1 Airway Equipment and Method: stylet,  oral airway and LTA kit utilized Placement Confirmation: ETT inserted through vocal cords under direct vision,  positive ETCO2 and breath sounds checked- equal and bilateral Secured at: 22 cm Tube secured with: Tape Dental Injury: Teeth and Oropharynx as per pre-operative assessment

## 2011-12-19 NOTE — Transfer of Care (Signed)
Immediate Anesthesia Transfer of Care Note  Patient: Brittany Marks  Procedure(s) Performed: Procedure(s) (LRB): ROTATOR CUFF REPAIR SHOULDER OPEN (Right)  Patient Location: Patient transported to PACU with oxygen via face mask at 4 Liters / Min  Anesthesia Type: General  Level of Consciousness: awake and alert   Airway & Oxygen Therapy: Patient Spontanous Breathing and Patient connected to face mask oxygen  Post-op Assessment: Report given to PACU RN and Post -op Vital signs reviewed and stable  Post vital signs: Reviewed and stable  Dentition: Teeth and oropharynx remain in pre-op condition  Complications: No apparent anesthesia complications

## 2011-12-19 NOTE — Brief Op Note (Signed)
12/19/2011  3:18 PM  PATIENT:  Deborha Payment  71 y.o. female  PRE-OPERATIVE DIAGNOSIS:  RIGHT SHOULDER ROTATOR CUFF TEAR,recurrent  POST-OPERATIVE DIAGNOSIS:  RIGHT SHOULDER ROTATOR  CUFF TEAR,recurrent  PROCEDURE:  Procedure(s) (LRB): ROTATOR CUFF REPAIR SHOULDER OPEN (Right)with anterior acromionectomy,Stryker anchor and tissue mend graft  SURGEON:  Surgeon(s) and Role:    * Drucilla Schmidt, MD - Primary  PHYSICIAN ASSISTANT:   ASSISTANTS:Mr Underwood PAC  ANESTHESIA:   regional and general  EBL:  Total I/O In: 1000 [I.V.:1000] Out: -   BLOOD ADMINISTERED:none  DRAINS: none   LOCAL MEDICATIONS USED:  NONE  SPECIMEN:  No Specimen  DISPOSITION OF SPECIMEN:  N/A  COUNTS:  YES  TOURNIQUET:  * No tourniquets in log *  DICTATION: .Other Dictation: Dictation Number [id number]  PLAN OF CARE: Admit for overnight observation  PATIENT DISPOSITION:  PACU - hemodynamically stable.   Delay start of Pharmacological VTE agent (>24hrs) due to surgical blood loss or risk of bleeding: not applicable

## 2011-12-19 NOTE — H&P (Signed)
Brittany Marks is an 71 y.o. female.   Chief Complaint: pain in Rt. Shoulder. HPI: Recurrent tear of Rt. Rotator cuff.  Past Medical History  Diagnosis Date  . Right rotator cuff tear     Past Surgical History  Procedure Date  . I & d superior right shoulder and closure wound 01-10-2011    S/P ROTATOR CUFF REPAIR  . Right shoulder arthroscopy/ open distal clavicle resection/ sad/ open rotator cuff repair 11-28-2010  . Lumbar laminectomy 1970'S  . Vaginal hysterectomy 1979  . Lumbar spine surgery 1983  . Cataract extraction w/ intraocular lens  implant, bilateral 2012    History reviewed. No pertinent family history. Social History:  reports that she has been smoking Cigarettes.  She has a 40 pack-year smoking history. She has never used smokeless tobacco. She reports that she drinks alcohol. She reports that she does not use illicit drugs.  Allergies: No Known Allergies  Medications Prior to Admission  Medication Dose Route Frequency Provider Last Rate Last Dose  . ceFAZolin (ANCEF) IVPB 1 g/50 mL premix  1 g Intravenous 60 min Pre-Op Illene Labrador Aplington, MD      . lactated ringers infusion   Intravenous Continuous Azell Der, MD      . povidone-iodine (BETADINE) 7.5 % scrub   Topical Once Drucilla Schmidt, MD       Medications Prior to Admission  Medication Sig Dispense Refill  . b complex vitamins tablet Take 1 tablet by mouth daily.      . COD LIVER OIL PO Take by mouth.      Marland Kitchen ibuprofen (ADVIL,MOTRIN) 200 MG tablet Take 200 mg by mouth every 6 (six) hours as needed.      . Multiple Vitamin (MULTIVITAMIN) tablet Take 1 tablet by mouth daily.      . vitamin C (ASCORBIC ACID) 500 MG tablet Take 500 mg by mouth daily.      . vitamin E 400 UNIT capsule Take 400 Units by mouth daily.        No results found for this or any previous visit (from the past 48 hour(s)). No results found.  Review of Systems  Constitutional: Negative.   HENT: Negative.   Eyes: Negative.     Respiratory: Positive for cough.   Cardiovascular: Negative.   Gastrointestinal: Negative.   Genitourinary: Negative.   Musculoskeletal: Positive for joint pain.  Neurological: Negative.   Endo/Heme/Allergies: Negative.   Psychiatric/Behavioral: Negative.     Blood pressure 126/62, pulse 70, temperature 97.5 F (36.4 C), temperature source Oral, resp. rate 18, height 5\' 2"  (1.575 m), weight 72.576 kg (160 lb), SpO2 95.00%. Physical Exam  Constitutional: She is oriented to person, place, and time. She appears well-developed and well-nourished.  HENT:  Head: Atraumatic.  Eyes: Conjunctivae are normal. Pupils are equal, round, and reactive to light.  Neck: Normal range of motion. Neck supple.  Cardiovascular: Normal rate and regular rhythm.   Respiratory: Effort normal and breath sounds normal.  GI: Soft.  Musculoskeletal: She exhibits tenderness.  Neurological: She is alert and oriented to person, place, and time.  Skin: Skin is warm.  Psychiatric: She has a normal mood and affect.     Assessment/Plan Re-tear of Rt. Rotator cuff, shoulder. Plan open repair with Porcine graft.  Phylliss Strege III,Noelle Hoogland L 12/19/2011, 11:54 AM

## 2011-12-20 NOTE — Op Note (Signed)
[name], Brittany Marks NO.:  [id number]  MEDICAL RECORD NO.:  [id number]  LOCATION:                                FACILITY:  [facility]  PHYSICIAN:  Marlowe Kays, M.D.  DATE OF BIRTH:  08/11/1941  DATE OF PROCEDURE:  12/19/2011 DATE OF DISCHARGE:                              OPERATIVE REPORT   PREOPERATIVE DIAGNOSIS:  Recurrent rotator cuff tear, right shoulder.  POSTOPERATIVE DIAGNOSIS:  Recurrent rotator cuff tear, right shoulder.  OPERATION:  Anterior acromionectomy; repair of recurrent rotator cuff tear, right shoulder, using a 4-stranded Stryker anchor and TissueMend graft.  SURGEON:  Marlowe Kays, M.D.  ASSISTANTDruscilla Brownie. Idolina Primer, P.A.C.  ANESTHESIA:  General, preceded by interscalene block.  PATHOLOGY AND JUSTIFICATION FOR PROCEDURE:  She is a smoker, and I have repeatedly encouraged her smoking cessation.  The tissue in the past is quite thin, but I thought we had a solid repair at surgery today.  She had an anterior flap of rotator cuff retracted anteriorly over a bald area of the humeral head.  Mr. Angie Fava assistance was required for holding and manipulating the arm, and also retraction.  DESCRIPTION OF PROCEDURE:  Interscalene block by anesthesiologist, prophylactic antibiotics, satisfied general anesthesia, beach chair position on the sliding frame.  Right shoulder girdle was prepped with DuraPrep and draped in sterile field.  The old surgical Ioban employed. Time-out performed.  The old surgical scar was excised and I then dissected my way down to the residual anterior acromion.  I carefully dissected off the anterior acromion, and found a disrupted rotator cuff. Remnants of the prior Stryker anchor were removed.  The major pathology appeared to be an anterior flap of rotator cuff, which was quite thin and beneath it a bald humeral head after freeing up the rotator cuff, so that it can be mobilized laterally and posteriorly.  I  used a 4-stranded Stryker anchor once again to stabilize this, which seemed to cover the defect nicely.  Again, because of the thinness of the rotator cuff; however, I felt that a TissueMend graft would lend additional bulk and strength.  For both exposure and additional decompression, I performed additional anterior acromionectomy with MicroSaw and rongeur.  The graft I tacked down with interrupted 2-0 Vicryl.  The wound was irrigated with sterile saline and closure performed with interrupted #1 Vicryl in the fascia, 2-0 Vicryl in the subcutaneous tissue, and staples in the skin.  Betadine, Adaptic, and dry sterile dressing were applied.  She tolerated the procedure well.  At the time of this dictation, she was on her way to the recovery room in satisfactory condition with no known complication.          ______________________________ Marlowe Kays, M.D.     JA/MEDQ  D:  12/19/2011  T:  12/20/2011  Job:  [id number]

## 2011-12-31 ENCOUNTER — Encounter (HOSPITAL_BASED_OUTPATIENT_CLINIC_OR_DEPARTMENT_OTHER): Payer: Self-pay | Admitting: Orthopedic Surgery

## 2013-06-01 ENCOUNTER — Other Ambulatory Visit: Payer: Self-pay | Admitting: Orthopedic Surgery

## 2013-06-01 DIAGNOSIS — R9389 Abnormal findings on diagnostic imaging of other specified body structures: Secondary | ICD-10-CM

## 2013-06-03 ENCOUNTER — Other Ambulatory Visit: Payer: Self-pay | Admitting: Orthopedic Surgery

## 2013-06-03 DIAGNOSIS — M4800 Spinal stenosis, site unspecified: Secondary | ICD-10-CM

## 2013-06-05 ENCOUNTER — Ambulatory Visit
Admission: RE | Admit: 2013-06-05 | Discharge: 2013-06-05 | Disposition: A | Discharge status: Home / Self Care | Payer: Federal, State, Local not specified - PPO | Source: Ambulatory Visit | Attending: Orthopedic Surgery | Admitting: Orthopedic Surgery

## 2013-06-05 DIAGNOSIS — R9389 Abnormal findings on diagnostic imaging of other specified body structures: Secondary | ICD-10-CM

## 2013-06-10 ENCOUNTER — Other Ambulatory Visit: Payer: Self-pay | Admitting: Orthopedic Surgery

## 2013-06-10 ENCOUNTER — Ambulatory Visit
Admission: RE | Admit: 2013-06-10 | Discharge: 2013-06-10 | Disposition: A | Discharge status: Home / Self Care | Payer: Federal, State, Local not specified - PPO | Source: Ambulatory Visit | Attending: Orthopedic Surgery | Admitting: Orthopedic Surgery

## 2013-06-10 ENCOUNTER — Ambulatory Visit
Admission: RE | Admit: 2013-06-10 | Discharge: 2013-06-10 | Disposition: A | Discharge status: Home / Self Care | Payer: Self-pay | Source: Ambulatory Visit | Attending: Orthopedic Surgery | Admitting: Orthopedic Surgery

## 2013-06-10 VITALS — BP 122/74 | HR 73

## 2013-06-10 DIAGNOSIS — M4800 Spinal stenosis, site unspecified: Secondary | ICD-10-CM

## 2013-06-10 DIAGNOSIS — M549 Dorsalgia, unspecified: Secondary | ICD-10-CM

## 2013-06-10 MED ORDER — DIAZEPAM 5 MG PO TABS
5.0000 mg | ORAL_TABLET | Freq: Once | ORAL | Status: AC
  Administered 2013-06-10: 5 mg via ORAL

## 2013-06-10 MED ORDER — IOHEXOL 180 MG/ML  SOLN
20.0000 mL | Freq: Once | INTRAMUSCULAR | Status: AC | PRN
  Administered 2013-06-10: 20 mL via INTRATHECAL

## 2013-07-20 ENCOUNTER — Encounter (HOSPITAL_COMMUNITY): Payer: Self-pay | Admitting: Pharmacy Technician

## 2013-07-23 ENCOUNTER — Ambulatory Visit (HOSPITAL_COMMUNITY)
Admission: RE | Admit: 2013-07-23 | Discharge: 2013-07-23 | Disposition: A | Discharge status: Home / Self Care | Payer: Federal, State, Local not specified - PPO | Source: Ambulatory Visit | Attending: Surgical | Admitting: Surgical

## 2013-07-23 ENCOUNTER — Ambulatory Visit (HOSPITAL_COMMUNITY): Admission: RE | Admit: 2013-07-23 | Payer: Federal, State, Local not specified - PPO | Source: Ambulatory Visit

## 2013-07-23 ENCOUNTER — Encounter (HOSPITAL_COMMUNITY)
Admission: RE | Admit: 2013-07-23 | Discharge: 2013-07-23 | Disposition: A | Discharge status: Home / Self Care | Payer: Federal, State, Local not specified - PPO | Source: Ambulatory Visit | Attending: Orthopedic Surgery | Admitting: Orthopedic Surgery

## 2013-07-23 ENCOUNTER — Encounter (HOSPITAL_COMMUNITY): Payer: Self-pay

## 2013-07-23 DIAGNOSIS — M51379 Other intervertebral disc degeneration, lumbosacral region without mention of lumbar back pain or lower extremity pain: Secondary | ICD-10-CM | POA: Insufficient documentation

## 2013-07-23 DIAGNOSIS — Z01810 Encounter for preprocedural cardiovascular examination: Secondary | ICD-10-CM | POA: Insufficient documentation

## 2013-07-23 DIAGNOSIS — M5137 Other intervertebral disc degeneration, lumbosacral region: Secondary | ICD-10-CM | POA: Insufficient documentation

## 2013-07-23 DIAGNOSIS — Z01812 Encounter for preprocedural laboratory examination: Secondary | ICD-10-CM | POA: Insufficient documentation

## 2013-07-23 DIAGNOSIS — Z01818 Encounter for other preprocedural examination: Secondary | ICD-10-CM | POA: Insufficient documentation

## 2013-07-23 DIAGNOSIS — I700 Atherosclerosis of aorta: Secondary | ICD-10-CM | POA: Insufficient documentation

## 2013-07-23 DIAGNOSIS — M5380 Other specified dorsopathies, site unspecified: Secondary | ICD-10-CM | POA: Insufficient documentation

## 2013-07-23 DIAGNOSIS — R9431 Abnormal electrocardiogram [ECG] [EKG]: Secondary | ICD-10-CM | POA: Insufficient documentation

## 2013-07-23 HISTORY — DX: Unspecified rotator cuff tear or rupture of left shoulder, not specified as traumatic: M75.102

## 2013-07-23 LAB — COMPREHENSIVE METABOLIC PANEL
ALT: 26 U/L (ref 0–35)
AST: 25 U/L (ref 0–37)
Albumin: 3.8 g/dL (ref 3.5–5.2)
Alkaline Phosphatase: 67 U/L (ref 39–117)
BUN: 19 mg/dL (ref 6–23)
CO2: 27 mEq/L (ref 19–32)
Calcium: 9.8 mg/dL (ref 8.4–10.5)
Chloride: 100 mEq/L (ref 96–112)
Creatinine, Ser: 0.75 mg/dL (ref 0.50–1.10)
GFR calc Af Amer: >90 mL/min (ref >90)
GFR calc non Af Amer: 83 mL/min — ABNORMAL LOW (ref >90)
Glucose, Bld: 101 mg/dL — ABNORMAL HIGH (ref 70–99)
Potassium: 4.2 mEq/L (ref 3.5–5.1)
Sodium: 135 mEq/L (ref 135–145)
Total Bilirubin: 0.3 mg/dL (ref 0.3–1.2)
Total Protein: 7 g/dL (ref 6.0–8.3)

## 2013-07-23 LAB — CBC
HCT: 40.6 % (ref 36.0–46.0)
MCH: 33 pg (ref 26.0–34.0)
MCHC: 35.5 g/dL (ref 30.0–36.0)
MCV: 93.1 fL (ref 78.0–100.0)
Platelets: 228 10*3/uL (ref 150–400)
RDW: 12.2 % (ref 11.5–15.5)
WBC: 11.3 10*3/uL — ABNORMAL HIGH (ref 4.0–10.5)

## 2013-07-23 LAB — URINALYSIS, ROUTINE W REFLEX MICROSCOPIC
Bilirubin Urine: NEGATIVE
Glucose, UA: NEGATIVE mg/dL
Hgb urine dipstick: NEGATIVE
Ketones, ur: NEGATIVE mg/dL
Nitrite: NEGATIVE
Protein, ur: NEGATIVE mg/dL
Specific Gravity, Urine: 1.028 (ref 1.005–1.030)
Urobilinogen, UA: 0.2 mg/dL (ref 0.0–1.0)
pH: 6 (ref 5.0–8.0)

## 2013-07-23 LAB — URINE MICROSCOPIC-ADD ON

## 2013-07-23 LAB — APTT: aPTT: 31 seconds (ref 24–37)

## 2013-07-23 LAB — ABO/RH: ABO/RH(D): A NEG

## 2013-07-23 LAB — PROTIME-INR
INR: 1.01 (ref 0.00–1.49)
Prothrombin Time: 13.1 seconds (ref 11.6–15.2)

## 2013-07-23 NOTE — Patient Instructions (Addendum)
20 Brittany Marks  07/23/2013   Your procedure is scheduled on: 07/29/13  Report to [facility] at 5:15 AM.  Call this number if you have problems the morning of surgery [phone]: [phone]   Remember:   Do not eat food or drink liquids After Midnight.   Do not wear jewelry, make-up or nail polish.  Do not wear lotions, powders, or perfumes. You may wear deodorant.  Do not shave 48 hours prior to surgery. Men may shave face and neck.  Do not bring valuables to the hospital.  Contacts, dentures or bridgework may not be worn into surgery.  Leave suitcase in the car. After surgery it may be brought to your room.  For patients admitted to the hospital, checkout time is 11:00 AM the day of discharge.   Please read over the following fact sheets that you were given: incentive spirometry fact sheet Birdie Sons, RN  pre op nurse call if needed [phone]    FAILURE TO FOLLOW THESE INSTRUCTIONS MAY RESULT IN CANCELLATION OF YOUR SURGERY   Patient Signature: ___________________________________________

## 2013-07-26 NOTE — H&P (Signed)
Brittany Marks is an 72 y.o. female.   Chief Complaint: back pain HPI: Brittany Marks presented with a chief complaint of right buttock and thigh pain. She's having minimal low back pain. She says she's had this for a month. She has a history of sciatica. She's had 2 previous lumbar surgeries. She had a microdiscectomy in the 70s. She reports having surgery in the 80s that involved removal of scar tissue and spurs. She says she hasn't had any issue with the low back or pain in the legs until a month ago. She's had no injuries. The only thing she can attribute it to is that she's had shoulder surgery within the past year. Due to reduced ROM in the shoulder, she's had to do a lot of bending to accommodate. She feels that this may have aggravated her back and, therefore, caused the right thigh pain. She has no numbness or tingling in the legs, no groin pain. She says she mainly experiences this when she's walking, and occasionally she will have some discomfort at night when lying in bed. It's difficult for her to find a comfortable position. No change in bowel or bladder function. She has tried icing, application of heat, anti-inflammatories and rest, none of which has given her relief.She had a course of Prednisone which did not help. MRI and CT myelogram of the lumbar spine showed spinal stenosis, particularly at L3-L4 and L4-L5.  Past Medical History  Diagnosis Date  . Right rotator cuff tear   . Left rotator cuff tear     Past Surgical History  Procedure Laterality Date  . I & d superior right shoulder and closure wound  01-10-2011    S/P ROTATOR CUFF REPAIR  . Right shoulder arthroscopy/ open distal clavicle resection/ sad/ open rotator cuff repair  11-28-2010  . Lumbar laminectomy  1970'S  . Vaginal hysterectomy  1979  . Lumbar spine surgery  1983  . Cataract extraction w/ intraocular lens  implant, bilateral  2012  . Shoulder open rotator cuff repair  12/19/2011    Procedure:  ROTATOR CUFF REPAIR SHOULDER OPEN;  Surgeon: Drucilla Schmidt, MD;  Location: [facility];  Service: Orthopedics;  Laterality: Right;  RIGHT RECURRENT OPEN REPAIR OF THE ROTATOR CUFF WITH TISSUE MEND GRAFT ANTERIOR CHROMIOECTOMY    Social History:  reports that she has been smoking Cigarettes.  She has a 40 pack-year smoking history. She has never used smokeless tobacco. She reports that she drinks alcohol. She reports that she does not use illicit drugs.  Allergies: No Known Allergies   Current outpatient prescriptions: b complex vitamins tablet, Take 1 tablet by mouth daily., Disp: , Rfl: ;   COD LIVER OIL PO, Take 1 tablet by mouth daily. , Disp: , Rfl: ;   ibuprofen (ADVIL,MOTRIN) 200 MG tablet, Take 400-600 mg by mouth every 6 (six) hours as needed for pain. , Disp: , Rfl: ;   Multiple Vitamin (MULTIVITAMIN) tablet, Take 1 tablet by mouth daily., Disp: , Rfl:  nicotine (NICODERM CQ - DOSED IN MG/24 HR) 7 mg/24hr patch, Place 1 patch onto the skin daily., Disp: , Rfl: ;   oxyCODONE-acetaminophen (PERCOCET) 10-325 MG per tablet, Take 1 tablet by mouth every 4 (four) hours as needed for pain., Disp: , Rfl: ;   oxymetazoline (AFRIN) 0.05 % nasal spray, Place 2 sprays into the nose 2 (two) times daily as needed for congestion., Disp: , Rfl:  Polyethyl Glycol-Propyl Glycol (SYSTANE) 0.4-0.3 % SOLN, Apply 1 drop to  eye 2 (two) times daily as needed (dry eyes)., Disp: , Rfl: ;   sodium chloride (OCEAN) 0.65 % nasal spray, Place 1 spray into the nose 2 (two) times daily as needed for congestion., Disp: , Rfl: ;   vitamin C (ASCORBIC ACID) 500 MG tablet, Take 500 mg by mouth daily., Disp: , Rfl: ;  vitamin E 400 UNIT capsule, Take 400 Units by mouth daily., Disp: , Rfl:    Review of Systems  Constitutional: Negative.   HENT: Negative.   Eyes: Negative.   Respiratory: Negative.   Cardiovascular: Negative.   Gastrointestinal: Negative.   Genitourinary: Negative.    Musculoskeletal: Positive for back pain and joint pain. Negative for falls, myalgias and neck pain.  Skin: Negative.   Neurological: Negative.   Endo/Heme/Allergies: Negative.   Psychiatric/Behavioral: Negative.    Vitals Weight: 160 lb Height: 62 in Body Surface Area: 1.78 m Body Mass Index: 29.26 kg/m Pulse: 89 (Regular) BP: 150/98 (Sitting, Left Arm, Standard)  Physical Exam  Constitutional: She is oriented to person, place, and time. She appears well-developed and well-nourished. No distress.  HENT:  Head: Normocephalic and atraumatic.  Left Ear: External ear normal.  Nose: Nose normal.  Mouth/Throat: Oropharynx is clear and moist.  Eyes: Conjunctivae and EOM are normal.  Neck: Normal range of motion. Neck supple. No thyromegaly present.  Cardiovascular: Normal rate, regular rhythm, normal heart sounds and intact distal pulses.   No murmur heard. Respiratory: Breath sounds normal. No respiratory distress. She has no wheezes.  GI: Soft. Bowel sounds are normal. She exhibits no distension. There is no tenderness.  Musculoskeletal:       Right hip: Normal.       Left hip: Normal.       Right knee: Normal.       Left knee: Normal.       Lumbar back: She exhibits decreased range of motion, pain and spasm. She exhibits no tenderness.       Right lower leg: She exhibits no tenderness and no swelling.       Left lower leg: She exhibits no tenderness and no swelling.  Neurological: She is alert and oriented to person, place, and time. She has normal strength and normal reflexes. No sensory deficit.  Slightly positive SLR on the right  Skin: No rash noted. She is not diaphoretic. No erythema.  Psychiatric: She has a normal mood and affect. Her behavior is normal.     Assessment/Plan Lumbar spinal stenosis, L3-L4, L4-L5 She needs a decompressive lumbar laminectomy at L3-L4 and L4-L5. The possible complications of spinal surgery number one could be infection, which is  extremely rare. We do use antibiotics prior to the surgery and during surgery and after surgery. Number two is always a slight degree of probability that you could develop a blood clot in your leg after any type of surgery and we try our best to prevent that with aspirin post op when it is safe to begin. The third is a dural leak. That is the spinal fluid leak that could occur. At certain rare times the bone or the disc could literally stick to the dura which is the lining which contains the spinal fluid and we could develop a small tear in that lining which we then patch up. That is an extremely rare complication. The last and final complication is a recurrent disc rupture. That means that you could rupture another small piece of disc later on down the road and there is  about a 2% chance of that.  Clarine Elrod LAUREN 07/26/2013, 11:40 AM

## 2013-07-28 ENCOUNTER — Encounter (HOSPITAL_COMMUNITY): Payer: Self-pay | Admitting: Anesthesiology

## 2013-07-28 NOTE — Anesthesia Preprocedure Evaluation (Addendum)
Anesthesia Evaluation  Patient identified by MRN, date of birth, ID band Patient awake    Reviewed: Allergy & Precautions, H&P , NPO status , Patient's Chart, lab work & pertinent test results  Airway Mallampati: II TM Distance: >3 FB Neck ROM: Full    Dental  (+) Edentulous Upper and Edentulous Lower   Pulmonary Current Smoker,  CXR: No acute abnormalities. breath sounds clear to auscultation  Pulmonary exam normal       Cardiovascular Exercise Tolerance: Good negative cardio ROS  Rhythm:Regular Rate:Normal  ECG 07-23-13 reviewed: low voltage nonspecific TWA   Neuro/Psych  Neuromuscular disease negative psych ROS   GI/Hepatic negative GI ROS, Neg liver ROS,   Endo/Other  negative endocrine ROS  Renal/GU negative Renal ROS  negative genitourinary   Musculoskeletal negative musculoskeletal ROS (+)   Abdominal   Peds negative pediatric ROS (+)  Hematology negative hematology ROS (+)   Anesthesia Other Findings   Reproductive/Obstetrics negative OB ROS                          Anesthesia Physical Anesthesia Plan  ASA: II  Anesthesia Plan: General   Post-op Pain Management:    Induction: Intravenous  Airway Management Planned: Oral ETT  Additional Equipment:   Intra-op Plan:   Post-operative Plan: Extubation in OR  Informed Consent: I have reviewed the patients History and Physical, chart, labs and discussed the procedure including the risks, benefits and alternatives for the proposed anesthesia with the patient or authorized representative who has indicated his/her understanding and acceptance.   Dental advisory given  Plan Discussed with: CRNA  Anesthesia Plan Comments: (Both shoulders with limited ROM. Will discuss with Dr. Darrelyn Hillock.)       Anesthesia Quick Evaluation

## 2013-07-29 ENCOUNTER — Inpatient Hospital Stay (HOSPITAL_COMMUNITY)
Admission: RE | Admit: 2013-07-29 | Discharge: 2013-07-30 | DRG: 520 | Disposition: A | Discharge status: Home Health | Payer: Federal, State, Local not specified - PPO | Source: Ambulatory Visit | Attending: Orthopedic Surgery | Admitting: Orthopedic Surgery

## 2013-07-29 ENCOUNTER — Encounter (HOSPITAL_COMMUNITY)
Admission: RE | Disposition: A | Discharge status: Home Health | Payer: Self-pay | Source: Ambulatory Visit | Attending: Orthopedic Surgery

## 2013-07-29 ENCOUNTER — Encounter (HOSPITAL_COMMUNITY): Payer: Self-pay | Admitting: *Deleted

## 2013-07-29 ENCOUNTER — Encounter (HOSPITAL_COMMUNITY): Payer: Federal, State, Local not specified - PPO | Admitting: Anesthesiology

## 2013-07-29 ENCOUNTER — Ambulatory Visit (HOSPITAL_COMMUNITY): Payer: Federal, State, Local not specified - PPO

## 2013-07-29 ENCOUNTER — Ambulatory Visit (HOSPITAL_COMMUNITY): Payer: Federal, State, Local not specified - PPO | Admitting: Anesthesiology

## 2013-07-29 DIAGNOSIS — F17200 Nicotine dependence, unspecified, uncomplicated: Secondary | ICD-10-CM | POA: Diagnosis present

## 2013-07-29 DIAGNOSIS — M5430 Sciatica, unspecified side: Secondary | ICD-10-CM | POA: Diagnosis present

## 2013-07-29 DIAGNOSIS — M48062 Spinal stenosis, lumbar region with neurogenic claudication: Secondary | ICD-10-CM | POA: Diagnosis present

## 2013-07-29 DIAGNOSIS — M5126 Other intervertebral disc displacement, lumbar region: Principal | ICD-10-CM | POA: Diagnosis present

## 2013-07-29 DIAGNOSIS — Z6829 Body mass index (BMI) 29.0-29.9, adult: Secondary | ICD-10-CM

## 2013-07-29 HISTORY — PX: DECOMPRESSIVE LUMBAR LAMINECTOMY LEVEL 2: SHX5792

## 2013-07-29 LAB — TYPE AND SCREEN
ABO/RH(D): A NEG
Antibody Screen: NEGATIVE

## 2013-07-29 SURGERY — DECOMPRESSIVE LUMBAR LAMINECTOMY LEVEL 2
Anesthesia: General | Site: Back | Wound class: Clean

## 2013-07-29 MED ORDER — ONDANSETRON HCL 4 MG/2ML IJ SOLN: 4.0000 mg | INTRAMUSCULAR | Status: DC | PRN

## 2013-07-29 MED ORDER — OXYCODONE-ACETAMINOPHEN 5-325 MG PO TABS: 1.0000 | ORAL_TABLET | ORAL | Status: DC | PRN

## 2013-07-29 MED ORDER — HYDROMORPHONE HCL PF 1 MG/ML IJ SOLN: 0.2500 mg | INTRAMUSCULAR | Status: DC | PRN

## 2013-07-29 MED ORDER — OXYCODONE-ACETAMINOPHEN 10-325 MG PO TABS: 1.0000 | ORAL_TABLET | ORAL | Status: DC | PRN

## 2013-07-29 MED ORDER — OXYCODONE HCL 5 MG PO TABS: 5.0000 mg | ORAL_TABLET | ORAL | Status: DC | PRN

## 2013-07-29 MED ORDER — MENTHOL 3 MG MT LOZG: 1.0000 | LOZENGE | OROMUCOSAL | Status: DC | PRN

## 2013-07-29 MED ORDER — EPHEDRINE SULFATE 50 MG/ML IJ SOLN
INTRAMUSCULAR | Status: DC | PRN
  Administered 2013-07-29: 5 mg via INTRAVENOUS
  Administered 2013-07-29: 10 mg via INTRAVENOUS

## 2013-07-29 MED ORDER — THROMBIN 5000 UNITS EX SOLR
OROMUCOSAL | Status: DC | PRN
  Administered 2013-07-29: 09:00:00 via TOPICAL

## 2013-07-29 MED ORDER — LACTATED RINGERS IV SOLN
INTRAVENOUS | Status: DC
  Administered 2013-07-29 (×3): via INTRAVENOUS

## 2013-07-29 MED ORDER — ACETAMINOPHEN 650 MG RE SUPP: 650.0000 mg | RECTAL | Status: DC | PRN

## 2013-07-29 MED ORDER — PROPOFOL 10 MG/ML IV BOLUS
INTRAVENOUS | Status: DC | PRN
  Administered 2013-07-29: 150 mg via INTRAVENOUS

## 2013-07-29 MED ORDER — BUPIVACAINE-EPINEPHRINE PF 0.5-1:200000 % IJ SOLN
INTRAMUSCULAR | Status: AC
  Filled 2013-07-29: qty 30

## 2013-07-29 MED ORDER — ROCURONIUM BROMIDE 100 MG/10ML IV SOLN
INTRAVENOUS | Status: DC | PRN
  Administered 2013-07-29: 40 mg via INTRAVENOUS

## 2013-07-29 MED ORDER — GLYCOPYRROLATE 0.2 MG/ML IJ SOLN
INTRAMUSCULAR | Status: DC | PRN
  Administered 2013-07-29: .6 mg via INTRAVENOUS

## 2013-07-29 MED ORDER — FENTANYL CITRATE 0.05 MG/ML IJ SOLN
INTRAMUSCULAR | Status: DC | PRN
  Administered 2013-07-29 (×2): 50 ug via INTRAVENOUS
  Administered 2013-07-29: 100 ug via INTRAVENOUS
  Administered 2013-07-29: 50 ug via INTRAVENOUS

## 2013-07-29 MED ORDER — PROMETHAZINE HCL 25 MG/ML IJ SOLN: 6.2500 mg | INTRAMUSCULAR | Status: DC | PRN

## 2013-07-29 MED ORDER — CEFAZOLIN SODIUM 1-5 GM-% IV SOLN
1.0000 g | Freq: Three times a day (TID) | INTRAVENOUS | Status: AC
  Administered 2013-07-29 – 2013-07-30 (×3): 1 g via INTRAVENOUS
  Filled 2013-07-29 (×3): qty 50

## 2013-07-29 MED ORDER — PHENOL 1.4 % MT LIQD: 1.0000 | OROMUCOSAL | Status: DC | PRN

## 2013-07-29 MED ORDER — BACITRACIN-NEOMYCIN-POLYMYXIN 400-5-5000 EX OINT
TOPICAL_OINTMENT | CUTANEOUS | Status: AC
  Filled 2013-07-29: qty 1

## 2013-07-29 MED ORDER — FLEET ENEMA 7-19 GM/118ML RE ENEM: 1.0000 | ENEMA | Freq: Once | RECTAL | Status: AC | PRN

## 2013-07-29 MED ORDER — BISACODYL 10 MG RE SUPP: 10.0000 mg | Freq: Every day | RECTAL | Status: DC | PRN

## 2013-07-29 MED ORDER — METHOCARBAMOL 100 MG/ML IJ SOLN
500.0000 mg | Freq: Four times a day (QID) | INTRAVENOUS | Status: DC | PRN
  Filled 2013-07-29: qty 5

## 2013-07-29 MED ORDER — NEOSTIGMINE METHYLSULFATE 1 MG/ML IJ SOLN
INTRAMUSCULAR | Status: DC | PRN
  Administered 2013-07-29: 4 mg via INTRAVENOUS

## 2013-07-29 MED ORDER — METHOCARBAMOL 500 MG PO TABS
500.0000 mg | ORAL_TABLET | Freq: Four times a day (QID) | ORAL | Status: DC | PRN
  Administered 2013-07-30: 500 mg via ORAL
  Filled 2013-07-29: qty 1

## 2013-07-29 MED ORDER — ACETAMINOPHEN 325 MG PO TABS: 650.0000 mg | ORAL_TABLET | ORAL | Status: DC | PRN

## 2013-07-29 MED ORDER — KETAMINE HCL 50 MG/ML IJ SOLN
INTRAMUSCULAR | Status: DC | PRN
  Administered 2013-07-29: 20 mg via INTRAMUSCULAR

## 2013-07-29 MED ORDER — SODIUM CHLORIDE 0.9 % IR SOLN
Status: DC | PRN
  Administered 2013-07-29 (×2)

## 2013-07-29 MED ORDER — POLYETHYL GLYCOL-PROPYL GLYCOL 0.4-0.3 % OP SOLN: 1.0000 [drp] | Freq: Two times a day (BID) | OPHTHALMIC | Status: DC | PRN

## 2013-07-29 MED ORDER — HYDROMORPHONE HCL PF 1 MG/ML IJ SOLN
INTRAMUSCULAR | Status: DC | PRN
  Administered 2013-07-29 (×2): 0.5 mg via INTRAVENOUS

## 2013-07-29 MED ORDER — BUPIVACAINE-EPINEPHRINE 0.5% -1:200000 IJ SOLN
INTRAMUSCULAR | Status: DC | PRN
  Administered 2013-07-29: 20 mL

## 2013-07-29 MED ORDER — BUPIVACAINE LIPOSOME 1.3 % IJ SUSP
20.0000 mL | Freq: Once | INTRAMUSCULAR | Status: AC
  Administered 2013-07-29: 20 mL
  Filled 2013-07-29: qty 20

## 2013-07-29 MED ORDER — LIDOCAINE HCL (CARDIAC) 20 MG/ML IV SOLN
INTRAVENOUS | Status: DC | PRN
  Administered 2013-07-29: 70 mg via INTRAVENOUS

## 2013-07-29 MED ORDER — POLYVINYL ALCOHOL 1.4 % OP SOLN
1.0000 [drp] | Freq: Two times a day (BID) | OPHTHALMIC | Status: DC | PRN
  Filled 2013-07-29: qty 15

## 2013-07-29 MED ORDER — CEFAZOLIN SODIUM-DEXTROSE 2-3 GM-% IV SOLR
INTRAVENOUS | Status: AC
  Filled 2013-07-29: qty 50

## 2013-07-29 MED ORDER — NICOTINE 7 MG/24HR TD PT24
7.0000 mg | MEDICATED_PATCH | Freq: Every day | TRANSDERMAL | Status: DC
  Administered 2013-07-29: 14:00:00 7 mg via TRANSDERMAL
  Filled 2013-07-29 (×2): qty 1

## 2013-07-29 MED ORDER — MIDAZOLAM HCL 5 MG/5ML IJ SOLN
INTRAMUSCULAR | Status: DC | PRN
  Administered 2013-07-29 (×2): 1 mg via INTRAVENOUS

## 2013-07-29 MED ORDER — METHOCARBAMOL 500 MG PO TABS: 500.0000 mg | ORAL_TABLET | Freq: Four times a day (QID) | ORAL | Status: DC | PRN

## 2013-07-29 MED ORDER — CEFAZOLIN SODIUM-DEXTROSE 2-3 GM-% IV SOLR
2.0000 g | INTRAVENOUS | Status: AC
  Administered 2013-07-29: 2 g via INTRAVENOUS

## 2013-07-29 MED ORDER — DEXAMETHASONE SODIUM PHOSPHATE 10 MG/ML IJ SOLN
INTRAMUSCULAR | Status: DC | PRN
  Administered 2013-07-29: 10 mg via INTRAVENOUS

## 2013-07-29 MED ORDER — PHENYLEPHRINE HCL 10 MG/ML IJ SOLN
INTRAMUSCULAR | Status: DC | PRN
  Administered 2013-07-29 (×4): 80 ug via INTRAVENOUS
  Administered 2013-07-29: 40 ug via INTRAVENOUS

## 2013-07-29 MED ORDER — POLYETHYLENE GLYCOL 3350 17 G PO PACK: 17.0000 g | PACK | Freq: Every day | ORAL | Status: DC | PRN

## 2013-07-29 MED ORDER — HYDROCODONE-ACETAMINOPHEN 5-325 MG PO TABS: 1.0000 | ORAL_TABLET | ORAL | Status: DC | PRN

## 2013-07-29 MED ORDER — THROMBIN 5000 UNITS EX SOLR
CUTANEOUS | Status: AC
  Filled 2013-07-29: qty 10000

## 2013-07-29 MED ORDER — LACTATED RINGERS IV SOLN
INTRAVENOUS | Status: DC
  Administered 2013-07-29: 16:00:00 via INTRAVENOUS

## 2013-07-29 MED ORDER — HYDROMORPHONE HCL PF 1 MG/ML IJ SOLN: 0.5000 mg | INTRAMUSCULAR | Status: DC | PRN

## 2013-07-29 MED ORDER — ONDANSETRON HCL 4 MG/2ML IJ SOLN
INTRAMUSCULAR | Status: DC | PRN
  Administered 2013-07-29: 4 mg via INTRAVENOUS

## 2013-07-29 SURGICAL SUPPLY — 46 items
BAG ZIPLOCK 12X15 (MISCELLANEOUS) ×2 IMPLANT
BENZOIN TINCTURE PRP APPL 2/3 (GAUZE/BANDAGES/DRESSINGS) ×2 IMPLANT
CLEANER TIP ELECTROSURG 2X2 (MISCELLANEOUS) ×2 IMPLANT
CLOTH BEACON ORANGE TIMEOUT ST (SAFETY) IMPLANT
CONT SPECI 4OZ STER CLIK (MISCELLANEOUS) IMPLANT
DRAIN PENROSE 18X1/4 LTX STRL (WOUND CARE) IMPLANT
DRAPE LG THREE QUARTER DISP (DRAPES) ×2 IMPLANT
DRAPE MICROSCOPE LEICA (MISCELLANEOUS) ×2 IMPLANT
DRAPE POUCH INSTRU U-SHP 10X18 (DRAPES) ×2 IMPLANT
DRAPE SURG 17X11 SM STRL (DRAPES) ×2 IMPLANT
DRSG ADAPTIC 3X8 NADH LF (GAUZE/BANDAGES/DRESSINGS) ×2 IMPLANT
DRSG PAD ABDOMINAL 8X10 ST (GAUZE/BANDAGES/DRESSINGS) ×2 IMPLANT
DURAPREP 26ML APPLICATOR (WOUND CARE) ×2 IMPLANT
ELECT BLADE TIP CTD 4 INCH (ELECTRODE) ×2 IMPLANT
ELECT REM PT RETURN 9FT ADLT (ELECTROSURGICAL) ×2
ELECTRODE REM PT RTRN 9FT ADLT (ELECTROSURGICAL) ×1 IMPLANT
FLOSEAL 10ML (HEMOSTASIS) IMPLANT
GLOVE BIOGEL PI IND STRL 8 (GLOVE) ×2 IMPLANT
GLOVE BIOGEL PI INDICATOR 8 (GLOVE) ×2
GLOVE ECLIPSE 8.0 STRL XLNG CF (GLOVE) ×4 IMPLANT
GOWN STRL REIN XL XLG (GOWN DISPOSABLE) ×4 IMPLANT
KIT BASIN OR (CUSTOM PROCEDURE TRAY) ×2 IMPLANT
KIT POSITIONING SURG ANDREWS (MISCELLANEOUS) ×2 IMPLANT
MANIFOLD NEPTUNE II (INSTRUMENTS) ×2 IMPLANT
NEEDLE HYPO 22GX1.5 SAFETY (NEEDLE) ×2 IMPLANT
NEEDLE SPNL 18GX3.5 QUINCKE PK (NEEDLE) ×6 IMPLANT
NS IRRIG 1000ML POUR BTL (IV SOLUTION) IMPLANT
PATTIES SURGICAL .5 X.5 (GAUZE/BANDAGES/DRESSINGS) IMPLANT
PATTIES SURGICAL .75X.75 (GAUZE/BANDAGES/DRESSINGS) IMPLANT
PATTIES SURGICAL 1X1 (DISPOSABLE) IMPLANT
PIN SAFETY NICK PLATE  2 MED (MISCELLANEOUS)
PIN SAFETY NICK PLATE 2 MED (MISCELLANEOUS) IMPLANT
SPONGE GAUZE 4X4 12PLY (GAUZE/BANDAGES/DRESSINGS) ×2 IMPLANT
SPONGE LAP 4X18 X RAY DECT (DISPOSABLE) ×6 IMPLANT
SPONGE SURGIFOAM ABS GEL 100 (HEMOSTASIS) ×2 IMPLANT
STAPLER VISISTAT 35W (STAPLE) ×2 IMPLANT
SUT VIC AB 0 CT1 27 (SUTURE)
SUT VIC AB 0 CT1 27XBRD ANTBC (SUTURE) IMPLANT
SUT VIC AB 1 CT1 27 (SUTURE) ×3
SUT VIC AB 1 CT1 27XBRD ANTBC (SUTURE) ×3 IMPLANT
SYR 20CC LL (SYRINGE) ×4 IMPLANT
TAPE CLOTH SURG 6X10 WHT LF (GAUZE/BANDAGES/DRESSINGS) ×2 IMPLANT
TOWEL OR 17X26 10 PK STRL BLUE (TOWEL DISPOSABLE) ×2 IMPLANT
TOWEL OR NON WOVEN STRL DISP B (DISPOSABLE) ×2 IMPLANT
TRAY FOLEY CATH 14FRSI W/METER (CATHETERS) ×2 IMPLANT
TRAY LAMINECTOMY (CUSTOM PROCEDURE TRAY) ×2 IMPLANT

## 2013-07-29 NOTE — Op Note (Signed)
Brittany Marks, Brittany Marks NO.:  [id number]  MEDICAL RECORD NO.:  [id number]  LOCATION:  1611                         FACILITY:  [facility]  PHYSICIAN:  Georges Lynch. Rishi Vicario, M.D.DATE OF BIRTH:  [birth year]  DATE OF PROCEDURE:  07/29/2013 DATE OF DISCHARGE:                              OPERATIVE REPORT   SURGEON:  Georges Lynch. Darrelyn Hillock, M.D.  OPERATIVE ASSISTANT:  Jene Every, M.D.  PREOPERATIVE DIAGNOSES: 1. Severe spinal stenosis at L3-L4, right lower extremity. 2. Severe spinal stenosis at L4-L5, right lower extremity. 3. Herniated disk at L3-L4 on the right lower extremity. 4. Minimal weakness of the toe extensors on the right preop. 5. Previous lumbar laminectomy on the right at L5-S1 by another     surgeon in 1980.  POSTOPERATIVE DIAGNOSES: 1. Severe spinal stenosis at L3-L4, right lower extremity. 2. Severe spinal stenosis at L4-L5, right lower extremity. 3. Herniated disk at L3-L4 on the right lower extremity. 4. Minimal weakness of the toe extensors on the right preop. 5. Previous lumbar laminectomy on the right at L5-S1 by another     surgeon in 1980.  PROCEDURE:  Under general anesthesia, routine orthopedic prep and draping of the lower back was carried out.  Appropriate time-out was first carried out.  I also marked the appropriate right side of the back in the holding area.  At this time, after the time-out and prep, 2 needles were placed in the back for localization purposes.  X-ray was taken.  Incision then was made over the L3-4, L4-5 space, bleeders identified and cauterized.  After the incision was made, the muscle was separated from the lamina and spinous processes bilaterally.  Prior to doing that though I injected 20 mL of 0.5% Marcaine and epinephrine into the soft tissue structures to control bleeding.  At this time, 2 clamps were placed on the spinous processes of L3 and L4 and another x-ray was taken.  We then inserted the [facility] retractors.   I then carried out my complete decompressive lumbar laminectomy by removing the spinous processes of L3 and L4.  The microscope was then was brought in, we completed our dissection down to and did a decompression of the spinal canal.  Great care was taken not to injure the underlying dura. Bleeders were identified and cauterized as we proceeded.  As I said, we did use the microscope.  We first protected the dura with cottonoids and then went out laterally and recesses on both sides, decompressed the recesses.  We then went down distally at L4-5, opened up the 4-5 space, there was some scar tissue noted down there from previous surgery.  At this time, we then went down, did foraminotomies bilaterally for the L4 and L5 roots.  When we completed the procedure, we were able to easily pass a hockey-stick proximally and distally and out the foramina with ease.  The dura now was freely movable.  There was no further constrictions noted.  We then addressed the L3-4 disk, we felt that there was a small disk herniation, so we gently retracted the dura.  A needle was placed in the space and an x-ray was taken to verify the position.  We then made  a cruciate incision in the posterior longitudinal ligament.  I utilized the nerve hook and the Penfield 4 to remove small pieces of disk.  Note, this was more of a hard disk rather than soft disk.  After this, we thoroughly irrigated out the area and made sure there were no further constrictions, there were none.  We then loosely applied some thrombin-soaked Gelfoam, closed the wound in layers in usual fashion.  Note what I did do was I injected 20 mL of Exparel at the end of the procedure and I did leave a small distal deep proximal part of the wound open for drainage purposes.  The subcu was finally closed with #1 Vicryl, skin with metal staples, and sterile Neosporin dressing was applied.  Note she had 2 g of IV Ancef preop.           ______________________________ Georges Lynch. Darrelyn Hillock, M.D.     RAG/MEDQ  D:  07/29/2013  T:  07/29/2013  Job:  [id number]

## 2013-07-29 NOTE — Anesthesia Postprocedure Evaluation (Signed)
  Anesthesia Post-op Note  Patient: Brittany Marks  Procedure(s) Performed: Procedure(s) (LRB): LUMBAR LAMINECTOMY, DECOMPRESSION LUMBAR THREE TO FOUR, FOUR TO FIVE microdiscectomy l3,4 right (N/A)  Patient Location: PACU  Anesthesia Type: General  Level of Consciousness: awake and alert   Airway and Oxygen Therapy: Patient Spontanous Breathing  Post-op Pain: mild  Post-op Assessment: Post-op Vital signs reviewed, Patient's Cardiovascular Status Stable, Respiratory Function Stable, Patent Airway and No signs of Nausea or vomiting  Last Vitals:  Filed Vitals:   07/29/13 1305  BP: 146/71  Pulse: 101  Temp: 36.9 C  Resp: 16    Post-op Vital Signs: stable   Complications: No apparent anesthesia complications

## 2013-07-29 NOTE — Transfer of Care (Signed)
Immediate Anesthesia Transfer of Care Note  Patient: Brittany Marks  Procedure(s) Performed: Procedure(s): LUMBAR LAMINECTOMY, DECOMPRESSION LUMBAR THREE TO FOUR, FOUR TO FIVE microdiscectomy l3,4 right (N/A)  Patient Location: PACU  Anesthesia Type:General  Level of Consciousness: sedated  Airway & Oxygen Therapy: Patient Spontanous Breathing and Patient connected to face mask oxygen  Post-op Assessment: Report given to PACU RN and Post -op Vital signs reviewed and stable  Post vital signs: Reviewed and stable  Complications: No apparent anesthesia complications

## 2013-07-29 NOTE — Interval H&P Note (Signed)
History and Physical Interval Note:  07/29/2013 7:17 AM  Brittany Marks  has presented today for surgery, with the diagnosis of SPINAL STENOSIS  The various methods of treatment have been discussed with the patient and family. After consideration of risks, benefits and other options for treatment, the patient has consented to  Procedure(s): DECOMPRESSIVE (CENTRAL) L3-4 L4-5 (N/A) as a surgical intervention .  The patient's history has been reviewed, patient examined, no change in status, stable for surgery.  I have reviewed the patient's chart and labs.  Questions were answered to the patient's satisfaction.     Conner Muegge A

## 2013-07-29 NOTE — Brief Op Note (Signed)
07/29/2013  9:56 AM  PATIENT:  Brittany Marks  72 y.o. female  PRE-OPERATIVE DIAGNOSIS:  SPINAL STENOSIS LUMBAR THREE TO FOUR, FOUR TO FIVE and Herniated Lumbar Disc at L-3-L-4  POST-OPERATIVE DIAGNOSIS:  SPINAL STENOSIS LUMBAR THREE TO FOUR, FOUR TO FIVE and Herniated Lumbar Disc at L-3-L-4 on the Right.  PROCEDURE:  Procedure(s): LUMBAR LAMINECTOMY, DECOMPRESSION LUMBAR THREE TO FOUR, FOUR TO FIVE microdiscectomy l3,4 right (N/A)  SURGEON:  Surgeon(s) and Role:    * Jacki Cones, MD - Primary    * Javier Docker, MD - Assisting     ASSISTANTS: Jene Every MD  ANESTHESIA:   general  EBL:  Total I/O In: 2000 [I.V.:2000] Out: 250 [Urine:100; Blood:150]  BLOOD ADMINISTERED:none  DRAINS: none   LOCAL MEDICATIONS USED:  MARCAINE 20cc of 0.50% with Epinephrine and at end of case 20cc of Exparel.    SPECIMEN:  No Specimen  DISPOSITION OF SPECIMEN:  N/A  COUNTS:  YES  TOURNIQUET:  * No tourniquets in log *  DICTATION: .Other Dictation: Dictation Number [id number]  PLAN OF CARE: Admit to inpatient   PATIENT DISPOSITION:  PACU - hemodynamically stable.   Delay start of Pharmacological VTE agent (>24hrs) due to surgical blood loss or risk of bleeding: yes

## 2013-07-30 ENCOUNTER — Encounter (HOSPITAL_COMMUNITY): Payer: Self-pay | Admitting: Orthopedic Surgery

## 2013-07-30 NOTE — Plan of Care (Signed)
Problem: Consults Goal: Diagnosis - Spinal Surgery Outcome: Completed/Met Date Met:  07/30/13 Lumbar Laminectomy (Complex) with Microdiscectomy and Decompression L3-L4

## 2013-07-30 NOTE — Progress Notes (Signed)
Subjective: 1 Day Post-Op Procedure(s) (LRB): LUMBAR LAMINECTOMY, DECOMPRESSION LUMBAR THREE TO FOUR, FOUR TO FIVE microdiscectomy l3,4 right (N/A) Patient reports pain as 1 on 0-10 scale. Doing Well. Will DC today.   Objective: Vital signs in last 24 hours: Temp:  [97.3 F (36.3 C)-98.5 F (36.9 C)] 97.7 F (36.5 C) (10/23 0500) Pulse Rate:  [83-102] 87 (10/23 0500) Resp:  [12-19] 16 (10/23 0500) BP: (126-168)/(47-77) 168/69 mmHg (10/23 0500) SpO2:  [93 %-100 %] 93 % (10/23 0500) Weight:  [73.936 kg (163 lb)] 73.936 kg (163 lb) (10/22 2005)  Intake/Output from previous day: 10/22 0701 - 10/23 0700 In: 4918.3 [P.O.:1600; I.V.:3318.3] Out: 6550 [Urine:6400; Blood:150] Intake/Output this shift:    No results found for this basename: HGB,  in the last 72 hours No results found for this basename: WBC, RBC, HCT, PLT,  in the last 72 hours No results found for this basename: NA, K, CL, CO2, BUN, CREATININE, GLUCOSE, CALCIUM,  in the last 72 hours No results found for this basename: LABPT, INR,  in the last 72 hours  Dorsiflexion/Plantar flexion intact Compartment soft  Assessment/Plan: 1 Day Post-Op Procedure(s) (LRB): LUMBAR LAMINECTOMY, DECOMPRESSION LUMBAR THREE TO FOUR, FOUR TO FIVE microdiscectomy l3,4 right (N/A) Discharge home with home health  Brittany Marks A 07/30/2013, 7:37 AM

## 2013-07-30 NOTE — Evaluation (Signed)
Occupational Therapy Evaluation Patient Details Name: Brittany Marks MRN: [id number] DOB: Dec 31, 1940 Today's Date: 07/30/2013 Time: 4098-1191 OT Time Calculation (min): 29 min  OT Assessment / Plan / Recommendation History of present illness LUMBAR LAMINECTOMY, DECOMPRESSION LUMBAR THREE TO FOUR, FOUR TO FIVE microdiscectomy l3,4 right    Clinical Impression   Pt educated on back precautions and verbalizes understanding.  She plans home alone with daughter checking on her.  Pt will benefit from one more OT session to focus on bed mobility in prep for adls/transfers.      OT Assessment  Patient needs continued OT Services    Follow Up Recommendations  No OT follow up    Barriers to Discharge      Equipment Recommendations  None recommended by OT    Recommendations for Other Services    Frequency  Min 2X/week    Precautions / Restrictions Precautions Precautions: Back Restrictions Weight Bearing Restrictions: No   Pertinent Vitals/Pain Soreness only in back; repositioned    ADL  Upper Body Bathing: Set up Where Assessed - Upper Body Bathing: Unsupported sitting Lower Body Bathing: Set up (with reacher) Where Assessed - Lower Body Bathing: Unsupported sit to stand Upper Body Dressing: Minimal assistance (gown.  will wear button down shirts:  easier) Where Assessed - Upper Body Dressing: Unsupported sitting Lower Body Dressing: Set up (with reacher; no socks/slip on shoes) Where Assessed - Lower Body Dressing: Unsupported sit to stand Toilet Transfer: Supervision/safety Toilet Transfer Method: Sit to Barista: Regular height toilet Toileting - Clothing Manipulation and Hygiene: Moderate assistance Where Assessed - Toileting Clothing Manipulation and Hygiene: Sit to stand from 3-in-1 or toilet Tub/Shower Transfer: Supervision/safety Tub/Shower Transfer Method: Science writer: Walk in shower Equipment Used:  Reacher Transfers/Ambulation Related to ADLs: performed bed mobility and transfers with supervision ADL Comments: Pt has limited rom in bil shoulders and back precautions.  Cat litter box is biggest concern for her.  She will have daughter check in on her    OT Diagnosis: Generalized weakness  OT Problem List: Decreased strength OT Treatment Interventions: Self-care/ADL training;Patient/family education   OT Goals(Current goals can be found in the care plan section) Acute Rehab OT Goals Patient Stated Goal: home today; be able to care for self and cat OT Goal Formulation: With patient Time For Goal Achievement: 07/31/13 Potential to Achieve Goals: Good  Visit Information  Last OT Received On: 07/30/13 Assistance Needed: +1 History of Present Illness: LUMBAR LAMINECTOMY, DECOMPRESSION LUMBAR THREE TO FOUR, FOUR TO FIVE microdiscectomy l3,4 right        Prior Functioning     Home Living Family/patient expects to be discharged to:: Private residence Living Arrangements: Children Available Help at Discharge: Family Type of Home: House Home Access: Stairs to enter Secretary/administrator of Steps: 4-5 and 6 and 4  Entrance Stairs-Rails: Right Home Layout: One level Home Equipment: Cane - single point Additional Comments: built in seat in shower;  Prior Function Level of Independence: Independent Communication Communication: No difficulties         Vision/Perception     Cognition  Cognition Arousal/Alertness: Awake/alert Behavior During Therapy: WFL for tasks assessed/performed Overall Cognitive Status: Within Functional Limits for tasks assessed    Extremity/Trunk Assessment Upper Extremity Assessment Upper Extremity Assessment: Defer to OT evaluation RUE Deficits / Details: s/p RCR.  Can lift to about 80 degrees; difficulty with extension/internal and external rotation in combination for adls. distal wfls LUE Deficits / Details: has rct--not repaired.  Tends to  elevate scapula and very limited movement in all planes.  distal wfls.   Lower Extremity Assessment Lower Extremity Assessment: Overall WFL for tasks assessed     Mobility Bed Mobility Bed Mobility: Not assessed Rolling Left: 5: Supervision;With rail Left Sidelying to Sit: 4: Min assist;HOB flat Details for Bed Mobility Assistance: pt verbalizes technique Transfers Transfers: Sit to Stand Sit to Stand: 5: Supervision Stand to Sit: 5: Supervision Details for Transfer Assistance: cues for back precautions     Exercise     Balance Dynamic Standing Balance Dynamic Standing - Balance Support: No upper extremity supported;During functional activity Dynamic Standing - Level of Assistance: 5: Stand by assistance   End of Session OT - End of Session Activity Tolerance: Patient tolerated treatment well Patient left: in chair;with call bell/phone within reach  GO Functional Assessment Tool Used: clinical observation/judgment Functional Limitation: Self care Self Care Current Status (G9562): At least 1 percent but less than 20 percent impaired, limited or restricted Self Care Goal Status (Z3086): At least 1 percent but less than 20 percent impaired, limited or restricted   Lamark Schue 07/30/2013, 12:00 PM Marica Otter, OTR/L [phone] 07/30/2013

## 2013-07-30 NOTE — Evaluation (Addendum)
Physical Therapy Evaluation Patient Details Name: Brittany Marks MRN: [id number] DOB: [birth year] Today's Date: 07/30/2013 Time:  -     PT Assessment / Plan / Recommendation History of Present Illness  LUMBAR LAMINECTOMY, DECOMPRESSION LUMBAR THREE TO FOUR, FOUR TO FIVE microdiscectomy l3,4 right   Clinical Impression  Discussed/simulated removing ice from bottom drawer freezer and ways to adjust litter box cleaning to decrease stress on back; Pt verbalizes understanding. Pt planning for home today; Have instructed and discussed amb to tolerance for back strengthening/increasing activity tolerance, correct and safe  surfaces to walk on, etc; Pt again verbalizes understanding    PT Assessment  Patent does not need any further PT services    Follow Up Recommendations  No PT follow up    Does the patient have the potential to tolerate intense rehabilitation      Barriers to Discharge        Equipment Recommendations  None recommended by PT    Recommendations for Other Services     Frequency      Precautions / Restrictions Precautions Precautions: Back Restrictions Weight Bearing Restrictions: No   Pertinent Vitals/Pain Denies pain; mild muscle cramping with amb;       Mobility  Bed Mobility Bed Mobility: Not assessed Rolling Left: 5: Supervision;With rail Left Sidelying to Sit: 4: Min assist;HOB flat Details for Bed Mobility Assistance: pt verbalizes technique Transfers Transfers: Sit to Stand;Stand to Sit Sit to Stand: 5: Supervision Stand to Sit: 5: Supervision Details for Transfer Assistance: cues for back precautions Ambulation/Gait Ambulation/Gait Assistance: 5: Supervision;7: Independent Ambulation Distance (Feet): 300 Feet Assistive device: None Gait Pattern: Within Functional Limits General Gait Details: discussed using cane for safety and longer distances    Exercises  pt amb up and down steps with  One rail and min guard to supervision for safety and  cues for sequence/step to vs alternating   PT Diagnosis:    PT Problem List:   PT Treatment Interventions:       PT Goals(Current goals can be found in the care plan section)    Visit Information  Last PT Received On: 07/30/13 Assistance Needed: +1 History of Present Illness: LUMBAR LAMINECTOMY, DECOMPRESSION LUMBAR THREE TO FOUR, FOUR TO FIVE microdiscectomy l3,4 right        Prior Functioning  Home Living Family/patient expects to be discharged to:: Private residence Living Arrangements: Children Available Help at Discharge: Family Type of Home: House Home Access: Stairs to enter Secretary/administrator of Steps: 4-5 and 6 and 4  Entrance Stairs-Rails: Right Home Layout: One level Home Equipment: Cane - single point Additional Comments: built in seat in shower;  Prior Function Level of Independence: Independent Communication Communication: No difficulties    Cognition  Cognition Arousal/Alertness: Awake/alert Behavior During Therapy: WFL for tasks assessed/performed Overall Cognitive Status: Within Functional Limits for tasks assessed    Extremity/Trunk Assessment Upper Extremity Assessment Upper Extremity Assessment: Defer to OT evaluation RUE Deficits / Details: s/p RCR.  Can lift to about 80 degrees; difficulty with extension/internal and external rotation in combination for adls. distal wfls LUE Deficits / Details: has rct--not repaired.  Tends to elevate scapula and very limited movement in all planes.  distal wfls.   Lower Extremity Assessment Lower Extremity Assessment: Overall WFL for tasks assessed   Balance Dynamic Standing Balance Dynamic Standing - Balance Support: No upper extremity supported;During functional activity Dynamic Standing - Level of Assistance: 5: Stand by assistance  End of Session PT - End of Session Activity  Tolerance: Patient tolerated treatment well Patient left: with call bell/phone within reach;in chair  GP Functional Assessment  Tool Used: clinical judgement Functional Limitation: Mobility: Walking and moving around Mobility: Walking and Moving Around Current Status (W0981): At least 1 percent but less than 20 percent impaired, limited or restricted Mobility: Walking and Moving Around Goal Status [phone]): 0 percent impaired, limited or restricted Mobility: Walking and Moving Around Discharge Status [phone]): 0 percent impaired, limited or restricted   [facility] 07/30/2013, 10:31 AM

## 2013-08-03 NOTE — Discharge Summary (Signed)
Physician Discharge Summary   Patient ID: Brittany Marks MRN: [id number] DOB/AGE: [birth year] 72 y.o.  Admit date: 07/29/2013 Discharge date: 07/30/2013  Primary Diagnosis: Spinal stenosis, lumbar spine             Lumbar disc herniation  Admission Diagnoses:  Past Medical History  Diagnosis Date  . Right rotator cuff tear   . Left rotator cuff tear    Discharge Diagnoses:   Active Problems:   Spinal stenosis, lumbar region, with neurogenic claudication   Herniated lumbar intervertebral disc  Estimated body mass index is 29.81 kg/(m^2) as calculated from the following:   Height as of this encounter: 5\' 2"  (1.575 m).   Weight as of this encounter: 73.936 kg (163 lb).  Procedure:  Procedure(s) (LRB): LUMBAR LAMINECTOMY, DECOMPRESSION LUMBAR THREE TO FOUR, FOUR TO FIVE microdiscectomy l3,4 right (N/A)   Consults: None  HPI: Brittany Marks presented with a chief complaint of right buttock and thigh pain. She's having minimal low back pain. She says she's had this for a month. She has a history of sciatica. She's had 2 previous lumbar surgeries. She had a microdiscectomy in the 70s. She reports having surgery in the 80s that involved removal of scar tissue and spurs. She says she hasn't had any issue with the low back or pain in the legs until a month ago. She's had no injuries. The only thing she can attribute it to is that she's had shoulder surgery within the past year. Due to reduced ROM in the shoulder, she's had to do a lot of bending to accommodate. She feels that this may have aggravated her back and, therefore, caused the right thigh pain. She has no numbness or tingling in the legs, no groin pain. She says she mainly experiences this when she's walking, and occasionally she will have some discomfort at night when lying in bed. It's difficult for her to find a comfortable position. No change in bowel or bladder function. She has tried icing, application of heat, anti-inflammatories  and rest, none of which has given her relief. She had a course of Prednisone which did not help. MRI and CT myelogram of the lumbar spine showed spinal stenosis, particularly at L3-L4 and L4-L5.  Laboratory Data: Hospital Outpatient Visit on 07/23/2013  Component Date Value Range Status  . WBC 07/23/2013 11.3* 4.0 - 10.5 K/uL Final  . RBC 07/23/2013 4.36  3.87 - 5.11 MIL/uL Final  . Hemoglobin 07/23/2013 14.4  12.0 - 15.0 g/dL Final  . HCT 40/98/1191 40.6  36.0 - 46.0 % Final  . MCV 07/23/2013 93.1  78.0 - 100.0 fL Final  . MCH 07/23/2013 33.0  26.0 - 34.0 pg Final  . MCHC 07/23/2013 35.5  30.0 - 36.0 g/dL Final  . RDW 47/82/9562 12.2  11.5 - 15.5 % Final  . Platelets 07/23/2013 228  150 - 400 K/uL Final  . MRSA, PCR 07/23/2013 NEGATIVE  NEGATIVE Final  . Staphylococcus aureus 07/23/2013 NEGATIVE  NEGATIVE Final   Comment:                                 The Xpert SA Assay (FDA                          approved for NASAL specimens  in patients over 67 years of age),                          is one component of                          a comprehensive surveillance                          program.  Test performance has                          been validated by [employer] for patients greater                          than or equal to 81 year old.                          It is not intended                          to diagnose infection nor to                          guide or monitor treatment.  Marland Kitchen aPTT 07/23/2013 31  24 - 37 seconds Final  . Sodium 07/23/2013 135  135 - 145 mEq/L Final  . Potassium 07/23/2013 4.2  3.5 - 5.1 mEq/L Final  . Chloride 07/23/2013 100  96 - 112 mEq/L Final  . CO2 07/23/2013 27  19 - 32 mEq/L Final  . Glucose, Bld 07/23/2013 101* 70 - 99 mg/dL Final  . BUN 16/07/9603 19  6 - 23 mg/dL Final  . Creatinine, Ser 07/23/2013 0.75  0.50 - 1.10 mg/dL Final  . Calcium 54/06/8118 9.8  8.4 - 10.5 mg/dL Final  .  Total Protein 07/23/2013 7.0  6.0 - 8.3 g/dL Final  . Albumin 14/78/2956 3.8  3.5 - 5.2 g/dL Final  . AST 21/30/8657 25  0 - 37 U/L Final  . ALT 07/23/2013 26  0 - 35 U/L Final  . Alkaline Phosphatase 07/23/2013 67  39 - 117 U/L Final  . Total Bilirubin 07/23/2013 0.3  0.3 - 1.2 mg/dL Final  . GFR calc non Af Amer 07/23/2013 83* >90 mL/min Final  . GFR calc Af Amer 07/23/2013 >90  >90 mL/min Final   Comment: (NOTE)                          The eGFR has been calculated using the CKD EPI equation.                          This calculation has not been validated in all clinical situations.                          eGFR's persistently <90 mL/min signify possible Chronic Kidney                          Disease.  Marland Kitchen  Prothrombin Time 07/23/2013 13.1  11.6 - 15.2 seconds Final  . INR 07/23/2013 1.01  0.00 - 1.49 Final  . Color, Urine 07/23/2013 YELLOW  YELLOW Final  . APPearance 07/23/2013 CLOUDY* CLEAR Final  . Specific Gravity, Urine 07/23/2013 1.028  1.005 - 1.030 Final  . pH 07/23/2013 6.0  5.0 - 8.0 Final  . Glucose, UA 07/23/2013 NEGATIVE  NEGATIVE mg/dL Final  . Hgb urine dipstick 07/23/2013 NEGATIVE  NEGATIVE Final  . Bilirubin Urine 07/23/2013 NEGATIVE  NEGATIVE Final  . Ketones, ur 07/23/2013 NEGATIVE  NEGATIVE mg/dL Final  . Protein, ur 16/07/9603 NEGATIVE  NEGATIVE mg/dL Final  . Urobilinogen, UA 07/23/2013 0.2  0.0 - 1.0 mg/dL Final  . Nitrite 54/06/8118 NEGATIVE  NEGATIVE Final  . Leukocytes, UA 07/23/2013 SMALL* NEGATIVE Final  . ABO/RH(D) 07/23/2013 A NEG   Final  . Antibody Screen 07/23/2013 NEG   Final  . Sample Expiration 07/23/2013 08/01/2013   Final  . Squamous Epithelial / LPF 07/23/2013 FEW* RARE Final  . WBC, UA 07/23/2013 0-2  <3 WBC/hpf Final  . ABO/RH(D) 07/23/2013 A NEG   Final     X-Rays:Dg Chest 2 View  07/23/2013   CLINICAL DATA:  Preoperative evaluation for lumbar spine surgery  EXAM: CHEST  2 VIEW  COMPARISON:  None  FINDINGS: Normal heart size,  mediastinal contours, and pulmonary vascularity.  Atherosclerotic calcification aorta.  Lungs grossly clear.  No pulmonary infiltrate, pleural effusion or pneumothorax.  No acute osseous findings.  Scattered endplate spur formation thoracic spine.  IMPRESSION: No acute abnormalities.   Electronically Signed   By: Ulyses Southward M.D.   On: 07/23/2013 15:04   Dg Lumbar Spine 2-3 Views  07/23/2013   CLINICAL DATA:  Lumbar spinal stenosis  EXAM: LUMBAR SPINE - 2-3 VIEW  COMPARISON:  CT lumbar spine 06/10/2013  FINDINGS: Osseous demineralization.  Five non-rib-bearing lumbar type vertebrae.  Hypoplastic last repair.  Diffuse disc space narrowing and endplate spur formation throughout the lumbar spine.  Vertebral body heights maintained without fracture or subluxation.  No bone destruction or spondylolysis.  Mild facet degenerative changes lower lumbar spine.  Extensive atherosclerotic calcifications of the aortic and iliac arteries.  IMPRESSION: Degenerative disc and facet disease changes of the lumbar spine with osseous demineralization.   Electronically Signed   By: Ulyses Southward M.D.   On: 07/23/2013 15:06   Dg Spine Portable 1 View  07/29/2013   CLINICAL DATA:  Lumbar decompression  EXAM: PORTABLE SPINE - 1 VIEW  COMPARISON:  Films from earlier in the same day.  FINDINGS: Surgical retractors and instruments are now seen at the L3-4 interspace. Persistent multilevel disc space narrowing is noted.   Electronically Signed   By: Alcide Clever M.D.   On: 07/29/2013 09:08   Dg Spine Portable 1 View  07/29/2013   CLINICAL DATA:  Lumbar decompression  EXAM: PORTABLE SPINE - 1 VIEW  COMPARISON:  Lateral lumbar film from earlier in the same day.  FINDINGS: Surgical instruments are now noted adjacent to the spinous processes of L3 and L4. Stable degenerative changes are noted.   Electronically Signed   By: Alcide Clever M.D.   On: 07/29/2013 08:20   Dg Spine Portable 1 View  07/29/2013   CLINICAL DATA:  Lumbar  decompression  EXAM: PORTABLE SPINE - 1 VIEW  COMPARISON:  07/23/2013  FINDINGS: A similar numbering nomenclature to that used on the prior exam. Needles are noted in the posterior soft tissues at the level of the L4  spinous process and L5 spinous process. Multilevel disc space narrowing with osteophytic changes are seen.   Electronically Signed   By: Alcide Clever M.D.   On: 07/29/2013 08:12    EKG: Orders placed during the hospital encounter of 07/29/13  . EKG     Hospital Course: DICIE EDELEN is a 72 y.o. who was admitted to [facility]. They were brought to the operating room on 07/29/2013 and underwent Procedure(s): LUMBAR LAMINECTOMY, DECOMPRESSION LUMBAR THREE TO FOUR, FOUR TO FIVE microdiscectomy l3,4 right.  Patient tolerated the procedure well and was later transferred to the recovery room and then to the orthopaedic floor for postoperative care.  They were given PO and IV analgesics for pain control following their surgery.  They were given 24 hours of postoperative antibiotics of  Anti-infectives   Start     Dose/Rate Route Frequency Ordered Stop   07/29/13 1600  ceFAZolin (ANCEF) IVPB 1 g/50 mL premix     1 g 100 mL/hr over 30 Minutes Intravenous Every 8 hours 07/29/13 1149 07/30/13 0747   07/29/13 0852  polymyxin B 500,000 Units, bacitracin 50,000 Units in sodium chloride irrigation 0.9 % 500 mL irrigation  Status:  Discontinued       As needed 07/29/13 0852 07/29/13 0955   07/29/13 0540  ceFAZolin (ANCEF) IVPB 2 g/50 mL premix     2 g 100 mL/hr over 30 Minutes Intravenous On call to O.R. 07/29/13 0540 07/29/13 0724     and started on DVT prophylaxis in the form of Aspirin.   PT was ordered.  Discharge planning consulted to help with postop disposition and equipment needs.  Patient had a good night on the evening of surgery.  They started to get up OOB with therapy on day one. Dressing was changed and the incision was clean and dry. Patient was seen in rounds and was  ready to go home.   Discharge Medications: Prior to Admission medications   Medication Sig Start Date End Date Taking? Authorizing Provider  b complex vitamins tablet Take 1 tablet by mouth daily.   Yes Historical Provider, MD  COD LIVER OIL PO Take 1 tablet by mouth daily.    Yes Historical Provider, MD  ibuprofen (ADVIL,MOTRIN) 200 MG tablet Take 400-600 mg by mouth every 6 (six) hours as needed for pain.    Yes Historical Provider, MD  Multiple Vitamin (MULTIVITAMIN) tablet Take 1 tablet by mouth daily.   Yes Historical Provider, MD  oxymetazoline (AFRIN) 0.05 % nasal spray Place 2 sprays into the nose 2 (two) times daily as needed for congestion.   Yes Historical Provider, MD  Polyethyl Glycol-Propyl Glycol (SYSTANE) 0.4-0.3 % SOLN Apply 1 drop to eye 2 (two) times daily as needed (dry eyes).   Yes Historical Provider, MD  sodium chloride (OCEAN) 0.65 % nasal spray Place 1 spray into the nose 2 (two) times daily as needed for congestion.   Yes Historical Provider, MD  vitamin C (ASCORBIC ACID) 500 MG tablet Take 500 mg by mouth daily.   Yes Historical Provider, MD  vitamin E 400 UNIT capsule Take 400 Units by mouth daily.   Yes Historical Provider, MD  methocarbamol (ROBAXIN) 500 MG tablet Take 1 tablet (500 mg total) by mouth every 6 (six) hours as needed. 07/29/13   Zannie Locastro Tamala Ser, PA-C  nicotine (NICODERM CQ - DOSED IN MG/24 HR) 7 mg/24hr patch Place 1 patch onto the skin daily.    Historical Provider, MD  oxyCODONE (OXY  IR/ROXICODONE) 5 MG immediate release tablet Take 1-2 tablets (5-10 mg total) by mouth every 4 (four) hours as needed. 07/29/13   Shloima Clinch Tamala Ser, PA-C    Diet: Regular diet Activity:WBAT Follow-up:in 2 weeks Disposition - Home Discharged Condition: good   Discharge Orders   Future Orders Complete By Expires   Call MD / Call 911  As directed    Comments:     If you experience chest pain or shortness of breath, CALL 911 and be transported to the  hospital emergency room.  If you develope a fever above 101 F, pus (white drainage) or increased drainage or redness at the wound, or calf pain, call your surgeon's office.   Call MD / Call 911  As directed    Comments:     If you experience chest pain or shortness of breath, CALL 911 and be transported to the hospital emergency room.  If you develope a fever above 101 F, pus (white drainage) or increased drainage or redness at the wound, or calf pain, call your surgeon's office.   Constipation Prevention  As directed    Comments:     Drink plenty of fluids.  Prune juice may be helpful.  You may use a stool softener, such as Colace (over the counter) 100 mg twice a day.  Use MiraLax (over the counter) for constipation as needed.   Constipation Prevention  As directed    Comments:     Drink plenty of fluids.  Prune juice may be helpful.  You may use a stool softener, such as Colace (over the counter) 100 mg twice a day.  Use MiraLax (over the counter) for constipation as needed.   Diet - low sodium heart healthy  As directed    Diet - low sodium heart healthy  As directed    Discharge instructions  As directed    Comments:     Change your dressing daily. Shower only, no tub bath. Call if any temperatures greater than 101 or any wound complications: [phone] during the day and ask for Dr. Jeannetta Ellis nurse, Mackey Birchwood. For the first few days, remove your dressing, tape a piece of saran wrap over your incision, take your shower, then remove the saran wrap and put a clean dressing on. After two days you can shower without the saran wrap.   Discharge instructions  As directed    Comments:     Change your dressing daily. Shower only, no tub bath. Call if any temperatures greater than 101 or any wound complications: [phone] during the day and ask for Dr. Jeannetta Ellis nurse, Mackey Birchwood.   Driving restrictions  As directed    Comments:     No driving for 2 weeks   Driving restrictions  As  directed    Comments:     No driving for two weeks   Increase activity slowly as tolerated  As directed    Increase activity slowly as tolerated  As directed    Lifting restrictions  As directed    Comments:     No lifting       Medication List    STOP taking these medications       oxyCODONE-acetaminophen 10-325 MG per tablet  Commonly known as:  PERCOCET      TAKE these medications       b complex vitamins tablet  Take 1 tablet by mouth daily.     COD LIVER OIL PO  Take 1 tablet by mouth daily.  ibuprofen 200 MG tablet  Commonly known as:  ADVIL,MOTRIN  Take 400-600 mg by mouth every 6 (six) hours as needed for pain.     methocarbamol 500 MG tablet  Commonly known as:  ROBAXIN  Take 1 tablet (500 mg total) by mouth every 6 (six) hours as needed.     multivitamin tablet  Take 1 tablet by mouth daily.     nicotine 7 mg/24hr patch  Commonly known as:  NICODERM CQ - dosed in mg/24 hr  Place 1 patch onto the skin daily.     oxyCODONE 5 MG immediate release tablet  Commonly known as:  Oxy IR/ROXICODONE  Take 1-2 tablets (5-10 mg total) by mouth every 4 (four) hours as needed.     oxymetazoline 0.05 % nasal spray  Commonly known as:  AFRIN  Place 2 sprays into the nose 2 (two) times daily as needed for congestion.     sodium chloride 0.65 % nasal spray  Commonly known as:  OCEAN  Place 1 spray into the nose 2 (two) times daily as needed for congestion.     SYSTANE 0.4-0.3 % Soln  Generic drug:  Polyethyl Glycol-Propyl Glycol  Apply 1 drop to eye 2 (two) times daily as needed (dry eyes).     vitamin C 500 MG tablet  Commonly known as:  ASCORBIC ACID  Take 500 mg by mouth daily.     vitamin E 400 UNIT capsule  Take 400 Units by mouth daily.           Follow-up Information   Follow up with GIOFFRE,RONALD A, MD. Schedule an appointment as soon as possible for a visit in 2 weeks.   Specialty:  Orthopedic Surgery   Contact information:   [address] [place] [address] [phone]       Signed: Dimitri Ped [facility] 08/03/2013, 8:52 AM

## 2016-06-26 ENCOUNTER — Encounter (HOSPITAL_COMMUNITY): Payer: Self-pay | Admitting: *Deleted

## 2016-06-27 ENCOUNTER — Ambulatory Visit (HOSPITAL_COMMUNITY): Payer: Federal, State, Local not specified - PPO

## 2016-06-27 ENCOUNTER — Encounter (HOSPITAL_COMMUNITY)
Admission: RE | Disposition: A | Discharge status: Home / Self Care | Payer: Self-pay | Source: Ambulatory Visit | Attending: Orthopedic Surgery

## 2016-06-27 ENCOUNTER — Encounter (HOSPITAL_COMMUNITY): Payer: Self-pay | Admitting: General Practice

## 2016-06-27 ENCOUNTER — Observation Stay (HOSPITAL_COMMUNITY)
Admission: RE | Admit: 2016-06-27 | Discharge: 2016-06-28 | Disposition: A | Discharge status: Home / Self Care | Payer: Federal, State, Local not specified - PPO | Source: Ambulatory Visit | Attending: Orthopedic Surgery | Admitting: Orthopedic Surgery

## 2016-06-27 ENCOUNTER — Ambulatory Visit: Payer: Self-pay | Admitting: Physician Assistant

## 2016-06-27 ENCOUNTER — Ambulatory Visit (HOSPITAL_COMMUNITY): Payer: Federal, State, Local not specified - PPO | Admitting: Anesthesiology

## 2016-06-27 DIAGNOSIS — M4806 Spinal stenosis, lumbar region: Secondary | ICD-10-CM | POA: Diagnosis not present

## 2016-06-27 DIAGNOSIS — F17210 Nicotine dependence, cigarettes, uncomplicated: Secondary | ICD-10-CM | POA: Insufficient documentation

## 2016-06-27 DIAGNOSIS — Z79899 Other long term (current) drug therapy: Secondary | ICD-10-CM | POA: Insufficient documentation

## 2016-06-27 DIAGNOSIS — Z419 Encounter for procedure for purposes other than remedying health state, unspecified: Secondary | ICD-10-CM

## 2016-06-27 DIAGNOSIS — M5126 Other intervertebral disc displacement, lumbar region: Secondary | ICD-10-CM | POA: Diagnosis present

## 2016-06-27 DIAGNOSIS — K5903 Drug induced constipation: Secondary | ICD-10-CM | POA: Insufficient documentation

## 2016-06-27 DIAGNOSIS — M5136 Other intervertebral disc degeneration, lumbar region: Secondary | ICD-10-CM | POA: Diagnosis not present

## 2016-06-27 DIAGNOSIS — Z7409 Other reduced mobility: Secondary | ICD-10-CM

## 2016-06-27 HISTORY — DX: Constipation, unspecified: K59.00

## 2016-06-27 HISTORY — PX: LUMBAR LAMINECTOMY/DECOMPRESSION MICRODISCECTOMY: SHX5026

## 2016-06-27 LAB — CBC
HCT: 43.2 % (ref 36.0–46.0)
HEMOGLOBIN: 14.7 g/dL (ref 12.0–15.0)
MCH: 32.2 pg (ref 26.0–34.0)
MCHC: 34 g/dL (ref 30.0–36.0)
MCV: 94.7 fL (ref 78.0–100.0)
Platelets: 332 10*3/uL (ref 150–400)
RBC: 4.56 MIL/uL (ref 3.87–5.11)
RDW: 13.2 % (ref 11.5–15.5)
WBC: 14.3 10*3/uL — AB (ref 4.0–10.5)

## 2016-06-27 LAB — GLUCOSE, CAPILLARY: Glucose-Capillary: 329 mg/dL — ABNORMAL HIGH (ref 65–99)

## 2016-06-27 SURGERY — LUMBAR LAMINECTOMY/DECOMPRESSION MICRODISCECTOMY
Anesthesia: General | Site: Back

## 2016-06-27 MED ORDER — METHOCARBAMOL 500 MG PO TABS
ORAL_TABLET | ORAL | Status: AC
  Filled 2016-06-27: qty 1

## 2016-06-27 MED ORDER — CEFAZOLIN SODIUM-DEXTROSE 2-4 GM/100ML-% IV SOLN
2.0000 g | INTRAVENOUS | Status: AC
  Administered 2016-06-27: 2 g via INTRAVENOUS
  Filled 2016-06-27: qty 100

## 2016-06-27 MED ORDER — LABETALOL HCL 5 MG/ML IV SOLN
5.0000 mg | INTRAVENOUS | Status: AC | PRN
  Administered 2016-06-27: 5 mg via INTRAVENOUS

## 2016-06-27 MED ORDER — PROPOFOL 10 MG/ML IV BOLUS
INTRAVENOUS | Status: DC | PRN
  Administered 2016-06-27: 50 mg via INTRAVENOUS
  Administered 2016-06-27: 150 mg via INTRAVENOUS

## 2016-06-27 MED ORDER — LACTATED RINGERS IV SOLN
INTRAVENOUS | Status: DC
  Administered 2016-06-27 (×3): via INTRAVENOUS

## 2016-06-27 MED ORDER — SODIUM CHLORIDE 0.9 % IV SOLN: 250.0000 mL | INTRAVENOUS | Status: DC

## 2016-06-27 MED ORDER — ONDANSETRON HCL 4 MG/2ML IJ SOLN: 4.0000 mg | Freq: Once | INTRAMUSCULAR | Status: DC | PRN

## 2016-06-27 MED ORDER — THROMBIN 20000 UNITS EX KIT
PACK | CUTANEOUS | Status: DC | PRN
  Administered 2016-06-27: 15:00:00 via TOPICAL

## 2016-06-27 MED ORDER — OXYCODONE HCL 5 MG PO TABS: 5.0000 mg | ORAL_TABLET | Freq: Once | ORAL | Status: DC | PRN

## 2016-06-27 MED ORDER — POLYVINYL ALCOHOL 1.4 % OP SOLN
1.0000 [drp] | OPHTHALMIC | Status: DC | PRN
  Filled 2016-06-27: qty 15

## 2016-06-27 MED ORDER — FENTANYL CITRATE (PF) 100 MCG/2ML IJ SOLN
INTRAMUSCULAR | Status: AC
  Filled 2016-06-27: qty 4

## 2016-06-27 MED ORDER — CEFAZOLIN IN D5W 1 GM/50ML IV SOLN
1.0000 g | Freq: Three times a day (TID) | INTRAVENOUS | Status: AC
  Administered 2016-06-27 – 2016-06-28 (×2): 1 g via INTRAVENOUS
  Filled 2016-06-27: qty 50

## 2016-06-27 MED ORDER — DEXAMETHASONE SODIUM PHOSPHATE 10 MG/ML IJ SOLN
INTRAMUSCULAR | Status: AC
  Filled 2016-06-27: qty 1

## 2016-06-27 MED ORDER — BUPIVACAINE-EPINEPHRINE 0.25% -1:200000 IJ SOLN
INTRAMUSCULAR | Status: DC | PRN
  Administered 2016-06-27: 30 mL

## 2016-06-27 MED ORDER — OXYCODONE HCL 5 MG/5ML PO SOLN: 5.0000 mg | Freq: Once | ORAL | Status: DC | PRN

## 2016-06-27 MED ORDER — OXYCODONE-ACETAMINOPHEN 10-325 MG PO TABS: 1.0000 | ORAL_TABLET | ORAL | 0 refills | Status: DC | PRN

## 2016-06-27 MED ORDER — ROCURONIUM BROMIDE 10 MG/ML (PF) SYRINGE
PREFILLED_SYRINGE | INTRAVENOUS | Status: DC | PRN
  Administered 2016-06-27: 40 mg via INTRAVENOUS
  Administered 2016-06-27: 20 mg via INTRAVENOUS

## 2016-06-27 MED ORDER — INSULIN ASPART 100 UNIT/ML ~~LOC~~ SOLN: 0.0000 [IU] | Freq: Three times a day (TID) | SUBCUTANEOUS | Status: DC

## 2016-06-27 MED ORDER — PHENYLEPHRINE HCL 10 MG/ML IJ SOLN
INTRAMUSCULAR | Status: DC | PRN
  Administered 2016-06-27: 120 ug via INTRAVENOUS
  Administered 2016-06-27 (×2): 80 ug via INTRAVENOUS

## 2016-06-27 MED ORDER — LACTATED RINGERS IV SOLN: INTRAVENOUS | Status: DC

## 2016-06-27 MED ORDER — ACETAMINOPHEN 10 MG/ML IV SOLN
INTRAVENOUS | Status: AC
  Filled 2016-06-27: qty 100

## 2016-06-27 MED ORDER — LIDOCAINE 2% (20 MG/ML) 5 ML SYRINGE
INTRAMUSCULAR | Status: AC
  Filled 2016-06-27: qty 5

## 2016-06-27 MED ORDER — LIDOCAINE 2% (20 MG/ML) 5 ML SYRINGE
INTRAMUSCULAR | Status: DC | PRN
  Administered 2016-06-27: 80 mg via INTRAVENOUS

## 2016-06-27 MED ORDER — ONDANSETRON HCL 4 MG/2ML IJ SOLN: 4.0000 mg | INTRAMUSCULAR | Status: DC | PRN

## 2016-06-27 MED ORDER — METHOCARBAMOL 500 MG PO TABS
500.0000 mg | ORAL_TABLET | Freq: Four times a day (QID) | ORAL | Status: DC | PRN
  Administered 2016-06-27 – 2016-06-28 (×2): 500 mg via ORAL
  Filled 2016-06-27: qty 1

## 2016-06-27 MED ORDER — METHOCARBAMOL 500 MG PO TABS: 500.0000 mg | ORAL_TABLET | Freq: Three times a day (TID) | ORAL | 0 refills | Status: DC | PRN

## 2016-06-27 MED ORDER — MENTHOL 3 MG MT LOZG
1.0000 | LOZENGE | OROMUCOSAL | Status: DC | PRN
  Filled 2016-06-27: qty 9

## 2016-06-27 MED ORDER — FENTANYL CITRATE (PF) 100 MCG/2ML IJ SOLN
INTRAMUSCULAR | Status: DC | PRN
  Administered 2016-06-27: 50 ug via INTRAVENOUS
  Administered 2016-06-27: 100 ug via INTRAVENOUS
  Administered 2016-06-27: 50 ug via INTRAVENOUS

## 2016-06-27 MED ORDER — ACETAMINOPHEN 10 MG/ML IV SOLN
INTRAVENOUS | Status: DC | PRN
  Administered 2016-06-27: 1000 mg via INTRAVENOUS

## 2016-06-27 MED ORDER — POLYETHYL GLYCOL-PROPYL GLYCOL 0.4-0.3 % OP SOLN: 1.0000 [drp] | Freq: Two times a day (BID) | OPHTHALMIC | Status: DC | PRN

## 2016-06-27 MED ORDER — OXYCODONE HCL 5 MG PO TABS
ORAL_TABLET | ORAL | Status: AC
  Filled 2016-06-27: qty 2

## 2016-06-27 MED ORDER — GLYCOPYRROLATE 0.2 MG/ML IV SOSY
PREFILLED_SYRINGE | INTRAVENOUS | Status: AC
  Filled 2016-06-27: qty 3

## 2016-06-27 MED ORDER — BUPIVACAINE-EPINEPHRINE (PF) 0.25% -1:200000 IJ SOLN
INTRAMUSCULAR | Status: AC
  Filled 2016-06-27: qty 30

## 2016-06-27 MED ORDER — MORPHINE SULFATE (PF) 2 MG/ML IV SOLN: 1.0000 mg | INTRAVENOUS | Status: DC | PRN

## 2016-06-27 MED ORDER — METHOCARBAMOL 1000 MG/10ML IJ SOLN
500.0000 mg | Freq: Four times a day (QID) | INTRAVENOUS | Status: DC | PRN
  Filled 2016-06-27: qty 5

## 2016-06-27 MED ORDER — ONDANSETRON HCL 4 MG/2ML IJ SOLN
INTRAMUSCULAR | Status: DC | PRN
  Administered 2016-06-27: 4 mg via INTRAVENOUS

## 2016-06-27 MED ORDER — FENTANYL CITRATE (PF) 100 MCG/2ML IJ SOLN
25.0000 ug | INTRAMUSCULAR | Status: DC | PRN
  Administered 2016-06-27 (×2): 50 ug via INTRAVENOUS

## 2016-06-27 MED ORDER — ONDANSETRON HCL 4 MG/2ML IJ SOLN
INTRAMUSCULAR | Status: AC
  Filled 2016-06-27: qty 2

## 2016-06-27 MED ORDER — OXYCODONE HCL 5 MG PO TABS
10.0000 mg | ORAL_TABLET | ORAL | Status: DC | PRN
  Administered 2016-06-27 – 2016-06-28 (×4): 10 mg via ORAL
  Filled 2016-06-27 (×3): qty 2

## 2016-06-27 MED ORDER — SODIUM CHLORIDE 0.9% FLUSH
3.0000 mL | Freq: Two times a day (BID) | INTRAVENOUS | Status: DC
  Administered 2016-06-27: 3 mL via INTRAVENOUS

## 2016-06-27 MED ORDER — INSULIN ASPART 100 UNIT/ML ~~LOC~~ SOLN
0.0000 [IU] | Freq: Every day | SUBCUTANEOUS | Status: DC
Start: 2016-06-27 — End: 2016-06-27

## 2016-06-27 MED ORDER — SODIUM CHLORIDE 0.9% FLUSH: 3.0000 mL | INTRAVENOUS | Status: DC | PRN

## 2016-06-27 MED ORDER — PROPOFOL 10 MG/ML IV BOLUS
INTRAVENOUS | Status: AC
  Filled 2016-06-27: qty 20

## 2016-06-27 MED ORDER — FENTANYL CITRATE (PF) 100 MCG/2ML IJ SOLN: 25.0000 ug | INTRAMUSCULAR | Status: DC | PRN

## 2016-06-27 MED ORDER — PHENOL 1.4 % MT LIQD: 1.0000 | OROMUCOSAL | Status: DC | PRN

## 2016-06-27 MED ORDER — ONDANSETRON HCL 4 MG PO TABS: 4.0000 mg | ORAL_TABLET | Freq: Three times a day (TID) | ORAL | 0 refills | Status: DC | PRN

## 2016-06-27 MED ORDER — THROMBIN 20000 UNITS EX SOLR
CUTANEOUS | Status: AC
Start: 2016-06-27 — End: 2016-06-27
  Filled 2016-06-27: qty 20000

## 2016-06-27 MED ORDER — 0.9 % SODIUM CHLORIDE (POUR BTL) OPTIME
TOPICAL | Status: DC | PRN
  Administered 2016-06-27: 1000 mL

## 2016-06-27 MED ORDER — ARTIFICIAL TEARS OP OINT
TOPICAL_OINTMENT | OPHTHALMIC | Status: AC
  Filled 2016-06-27: qty 3.5

## 2016-06-27 MED ORDER — LABETALOL HCL 5 MG/ML IV SOLN
INTRAVENOUS | Status: AC
  Filled 2016-06-27: qty 4

## 2016-06-27 MED ORDER — HEMOSTATIC AGENTS (NO CHARGE) OPTIME
TOPICAL | Status: DC | PRN
  Administered 2016-06-27: 1 via TOPICAL

## 2016-06-27 MED ORDER — ROCURONIUM BROMIDE 10 MG/ML (PF) SYRINGE
PREFILLED_SYRINGE | INTRAVENOUS | Status: AC
  Filled 2016-06-27: qty 10

## 2016-06-27 MED ORDER — DEXAMETHASONE SODIUM PHOSPHATE 10 MG/ML IJ SOLN
INTRAMUSCULAR | Status: DC | PRN
Start: 2016-06-27 — End: 2016-06-27
  Administered 2016-06-27: 10 mg via INTRAVENOUS

## 2016-06-27 MED ORDER — SUGAMMADEX SODIUM 200 MG/2ML IV SOLN
INTRAVENOUS | Status: DC | PRN
  Administered 2016-06-27: 150 mg via INTRAVENOUS

## 2016-06-27 MED ORDER — FENTANYL CITRATE (PF) 100 MCG/2ML IJ SOLN
INTRAMUSCULAR | Status: AC
  Filled 2016-06-27: qty 2

## 2016-06-27 SURGICAL SUPPLY — 50 items
BENZOIN TINCTURE PRP APPL 2/3 (GAUZE/BANDAGES/DRESSINGS) ×3 IMPLANT
BNDG GAUZE ELAST 4 BULKY (GAUZE/BANDAGES/DRESSINGS) ×3 IMPLANT
CANISTER SUCTION 2500CC (MISCELLANEOUS) ×3 IMPLANT
CLOSURE STERI-STRIP 1/2X4 (GAUZE/BANDAGES/DRESSINGS) ×1
CLSR STERI-STRIP ANTIMIC 1/2X4 (GAUZE/BANDAGES/DRESSINGS) ×2 IMPLANT
COVER SURGICAL LIGHT HANDLE (MISCELLANEOUS) ×3 IMPLANT
DRAPE SURG 17X23 STRL (DRAPES) ×9 IMPLANT
DRAPE U-SHAPE 47X51 STRL (DRAPES) ×3 IMPLANT
DRSG AQUACEL AG ADV 3.5X 6 (GAUZE/BANDAGES/DRESSINGS) ×3 IMPLANT
DURAPREP 26ML APPLICATOR (WOUND CARE) ×3 IMPLANT
ELECT BLADE 4.0 EZ CLEAN MEGAD (MISCELLANEOUS) ×3
ELECT PENCIL ROCKER SW 15FT (MISCELLANEOUS) ×3 IMPLANT
ELECT REM PT RETURN 9FT ADLT (ELECTROSURGICAL) ×3
ELECTRODE BLDE 4.0 EZ CLN MEGD (MISCELLANEOUS) ×1 IMPLANT
ELECTRODE REM PT RTRN 9FT ADLT (ELECTROSURGICAL) ×1 IMPLANT
GLOVE BIO SURGEON STRL SZ 6.5 (GLOVE) ×2 IMPLANT
GLOVE BIO SURGEONS STRL SZ 6.5 (GLOVE) ×1
GLOVE BIOGEL PI IND STRL 6.5 (GLOVE) ×1 IMPLANT
GLOVE BIOGEL PI IND STRL 8.5 (GLOVE) ×1 IMPLANT
GLOVE BIOGEL PI INDICATOR 6.5 (GLOVE) ×2
GLOVE BIOGEL PI INDICATOR 8.5 (GLOVE) ×2
GLOVE SS BIOGEL STRL SZ 8.5 (GLOVE) ×1 IMPLANT
GLOVE SUPERSENSE BIOGEL SZ 8.5 (GLOVE) ×2
GOWN STRL REUS W/ TWL XL LVL3 (GOWN DISPOSABLE) ×2 IMPLANT
GOWN STRL REUS W/TWL 2XL LVL3 (GOWN DISPOSABLE) ×3 IMPLANT
GOWN STRL REUS W/TWL XL LVL3 (GOWN DISPOSABLE) ×4
KIT BASIN OR (CUSTOM PROCEDURE TRAY) ×3 IMPLANT
KIT ROOM TURNOVER OR (KITS) ×3 IMPLANT
NEEDLE 22X1 1/2 (OR ONLY) (NEEDLE) ×3 IMPLANT
NEEDLE SPNL 18GX3.5 QUINCKE PK (NEEDLE) ×6 IMPLANT
NS IRRIG 1000ML POUR BTL (IV SOLUTION) ×3 IMPLANT
PACK LAMINECTOMY ORTHO (CUSTOM PROCEDURE TRAY) ×3 IMPLANT
PACK UNIVERSAL I (CUSTOM PROCEDURE TRAY) ×3 IMPLANT
PAD ARMBOARD 7.5X6 YLW CONV (MISCELLANEOUS) ×6 IMPLANT
PATTIES SURGICAL .5 X.5 (GAUZE/BANDAGES/DRESSINGS) IMPLANT
PATTIES SURGICAL .5 X1 (DISPOSABLE) ×3 IMPLANT
SPONGE SURGIFOAM ABS GEL 100 (HEMOSTASIS) ×3 IMPLANT
SURGIFLO W/THROMBIN 8M KIT (HEMOSTASIS) ×3 IMPLANT
SUT BONE WAX W31G (SUTURE) ×3 IMPLANT
SUT MON AB 3-0 SH 27 (SUTURE) ×2
SUT MON AB 3-0 SH27 (SUTURE) ×1 IMPLANT
SUT VIC AB 1 CT1 27 (SUTURE) ×2
SUT VIC AB 1 CT1 27XBRD ANBCTR (SUTURE) ×1 IMPLANT
SUT VIC AB 1 CTX 18 (SUTURE) ×3 IMPLANT
SUT VIC AB 2-0 CT1 18 (SUTURE) ×3 IMPLANT
SYR CONTROL 10ML LL (SYRINGE) ×3 IMPLANT
TOWEL OR 17X24 6PK STRL BLUE (TOWEL DISPOSABLE) ×3 IMPLANT
TOWEL OR 17X26 10 PK STRL BLUE (TOWEL DISPOSABLE) ×3 IMPLANT
WATER STERILE IRR 1000ML POUR (IV SOLUTION) ×3 IMPLANT
YANKAUER SUCT BULB TIP NO VENT (SUCTIONS) ×3 IMPLANT

## 2016-06-27 NOTE — Brief Op Note (Signed)
06/27/2016  4:31 PM  PATIENT:  Brittany Marks  75 y.o. female  PRE-OPERATIVE DIAGNOSIS:  L1 - L2 Right HNP  POST-OPERATIVE DIAGNOSIS:  L1 - L2 Right HNP  PROCEDURE:  Procedure(s): L1 - L2 DISCECTOMY (N/A)  SURGEON:  Surgeon(s) and Role:    * Melina Schools, MD - Primary  PHYSICIAN ASSISTANT:   ASSISTANTS: none   ANESTHESIA:   general  EBL:  Total I/O In: 1000 [I.V.:1000] Out: -   BLOOD ADMINISTERED:none  DRAINS: none   LOCAL MEDICATIONS USED:  MARCAINE     SPECIMEN:  No Specimen  DISPOSITION OF SPECIMEN:  N/A  COUNTS:  YES  TOURNIQUET:  * No tourniquets in log *  DICTATION: .Other Dictation: Dictation Number [id number]  PLAN OF CARE: Admit for overnight observation  PATIENT DISPOSITION:  PACU - hemodynamically stable.

## 2016-06-27 NOTE — Anesthesia Preprocedure Evaluation (Addendum)
Anesthesia Evaluation  Patient identified by MRN, date of birth, ID band Patient awake    Reviewed: Allergy & Precautions, NPO status , Patient's Chart, lab work & pertinent test results  Airway Mallampati: II  TM Distance: >3 FB     Dental  (+) Edentulous Upper, Edentulous Lower   Pulmonary Current Smoker,    breath sounds clear to auscultation       Cardiovascular  Rhythm:Regular Rate:Normal     Neuro/Psych    GI/Hepatic   Endo/Other    Renal/GU      Musculoskeletal   Abdominal   Peds  Hematology   Anesthesia Other Findings   Reproductive/Obstetrics                            Anesthesia Physical Anesthesia Plan  ASA: III  Anesthesia Plan: General   Post-op Pain Management:    Induction: Intravenous  Airway Management Planned: Oral ETT  Additional Equipment:   Intra-op Plan:   Post-operative Plan: Extubation in OR  Informed Consent: I have reviewed the patients History and Physical, chart, labs and discussed the procedure including the risks, benefits and alternatives for the proposed anesthesia with the patient or authorized representative who has indicated his/her understanding and acceptance.   Dental advisory given  Plan Discussed with: CRNA and Anesthesiologist  Anesthesia Plan Comments:         Anesthesia Quick Evaluation

## 2016-06-27 NOTE — H&P (Signed)
History of Present Illness  The patient is a 75 year old female who comes in today for a preoperative History and Physical. The patient is scheduled for a L1-2 disectomy to be performed by Dr. Duane Lope D. Rolena Infante, MD at [place] on 06-27-16 . Please see the hospital record for complete dictated history and physical. Note for "H & P": The patient states the Hydrocodone does not help so she switched back to the Dilaudid from Dr Gladstone Lighter.  Additional reasons for visit:  Transition into care is described as the following: The patient is transitioning into care and a summary of care was reviewed.   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 mother and father Heart Disease  Brother.  Social History Children  1 Alcohol use  current drinker; drinks beer, wine and hard liquor; only occasionally per week Current drinker  05/17/2014: Currently drinks beer only occasionally per week Illicit drug use  no Current work status  retired Pain Contract  no Exercise  Exercises rarely Exercises weekly; does other Living situation  live alone Marital status  divorced Previously in rehab  no No history of drug/alcohol rehab  Not under pain contract  Number of flights of stairs before winded  less than 1 Tobacco / smoke exposure  05/17/2014: no no Tobacco use  Current every day smoker, Current some day smoker. 05/17/2014: smoke(d) less than 1/2 pack(s) per day is trying to quit current every day smoker; smoke(d) 3 or more pack(s) per day Drug/Alcohol Rehab (Currently)  no  Medication History  PredniSONE ('10MG'$  Tablet, 1 (one) Tablet Oral take as directed, Taken starting 06/16/2016)  Active. (patient has instructions) Movantik ('25MG'$  Tablet, 1 (one) Tablet Oral 1 po q day, Taken starting 06/16/2016) Active. Zofran ('4MG'$  Tablet, 1 (one) Tablet Oral 1 po 6q prn nausea, Taken starting 06/16/2016) Active. Amitiza (24MCG Capsule, 1 (one) Capsule Oral PO BID, Taken starting 06/18/2016) Active. (DDB/RCY) Robaxin ('500MG'$  Tablet, 1 (one) Tablet Oral two times daily, as needed, Taken starting 06/22/2016) Active. Dilaudid ('2MG'$  Tablet, 1 (one) Tablet Oral every 4-6 hours as needed for pain, Taken starting 06/16/2016) Inactive. Medications Reconciled  Past Surgical History Cataract Surgery  bilateral Hysterectomy  complete (non-cancerous) Tonsillectomy  Rotator Cuff Repair  right Spinal Surgery   Other Problems (Robin C. Young; 06/25/2016 9:12 AM) Osteoarthritis   Vitals (Robin C. Young; 06/25/2016 9:18 AM) 06/25/2016 9:16 AM Weight: 165 lb Height: 62in Body Surface Area: 1.76 m Body Mass Index: 30.18 kg/m  Temp.: 98.58F(Oral)  BP: 119/61 (Sitting, Left Arm, Standard)  General General Appearance-Not in acute distress. Orientation-Oriented X3. Build & Nutrition-Well nourished and Well developed.  Integumentary General Characteristics Surgical Scars - surgical scarring consistent with previous lumbar surgery and surgical scarring consistent with previous right shoulder surgery. Lumbar Spine-Skin examination of the lumbar spine is without deformity, skin lesions, lacerations or abrasions.  Abdomen Palpation/Percussion Palpation and Percussion of the abdomen reveal - Soft, Non Tender and No Rebound tenderness.  Peripheral Vascular Lower Extremity Palpation - Posterior tibial pulse - Bilateral - 2+. Dorsalis pedis pulse - Bilateral - 2+.  Neurologic Sensation Lower Extremity - Left - sensation is intact in the lower extremity. Right - sensation is diminished in the lower extremity. Reflexes Patellar Reflex - Bilateral - 2+. Achilles Reflex  -  Bilateral - 2+. Clonus - Bilateral - clonus not present. Hoffman's Sign - Bilateral - Hoffman's sign not present. Testing Seated Straight Leg Raise - Right - Seated straight leg raise positive.  Musculoskeletal Spine/Ribs/Pelvis  Lumbosacral Spine: Inspection and Palpation - Tenderness - right lumbar paraspinals tender to palpation and right buttock is tender to palpation. Strength and Tone: Strength - Hip Flexion - Left - 5/5. Right - 3+/5. Knee Extension - Bilateral - 5/5. Knee Flexion - Bilateral - 5/5. Ankle Dorsiflexion - Bilateral - 5/5. Ankle Plantarflexion - Bilateral - 5/5. Heel walk - Bilateral - unable to heel walk. Toe Walk - Bilateral - unable to walk on toes. Heel-Toe Walk - Bilateral - unable to heel-toe walk. ROM - Flexion - moderately decreased range of motion and painful. Extension - moderately decreased range of motion and painful. Left Lateral Bending - moderately decreased range of motion and painful. Right Lateral Bending - moderately decreased range of motion and painful. Right Rotation - moderately decreased range of motion and painful. Left Rotation - moderately decreased range of motion and painful. Pain - . Lumbosacral Spine - Waddell's Signs - no Waddell's signs present. Lower Extremity Range of Motion - No true hip, knee or ankle pain with range of motion. Gait and Station - [employer] - cane and wheelchair.  At this point in time, her MRI was reviewed. She has a large right L1-2 disc herniation with caudal extension down to the L2-3 disc space. 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 Previous scar tissue from her previous lumbar laminotomy discectomies and the lower lumbar spine 3-4, 4-5. There is severe stenosis at L1-2, largely secondary to the disk herniation. At L2-3, there is moderate spinal stenosis, but I think this is largely due to the caudal extension of the disc herniation from L1-2.  Plan:  Posterior Lumbar  Decompression/disectomy: Risks of surgery include infection, bleeding, nerve damage, death, stroke, paralysis, failure to heal, need for further surgery, ongoing or worse pain, need for further surgery, CSF leak, loss of bowel or bladder, and recurrent disc herniation or Stenosis which would necessitate need for further surgery.  Goal Of Surgery: Discussed that goal of surgery is to reduce pain and improve function and quality of life. Patient is aware that despite all appropriate treatment that there pain and function could be the same, worse, or different.  At this point, she is complaining predominantly of the back and right leg pain secondary to the disc herniation. Prior to the disc herniating three weeks ago she was in adequate shape without significant complaints. Therefore, my plan would be just to address this disc herniation with a right-sided laminectomy of L1 and partial fasciectomy on the right side of L1. We have gone over the risks, benefits of surgery and we will plan on moving forward in expedited fashion. Currently, she is also having significant constipation due to the Dilaudid and so I have given her also a prescription for Amitiza.

## 2016-06-27 NOTE — Progress Notes (Signed)
Spoke with Dr Deatra Canter re: sbp 180's, orders for labetalol.

## 2016-06-27 NOTE — Transfer of Care (Signed)
Immediate Anesthesia Transfer of Care Note  Patient: Brittany Marks  Procedure(s) Performed: Procedure(s): L1 - L2 DISCECTOMY (N/A)  Patient Location: PACU  Anesthesia Type:General  Level of Consciousness: sedated and patient cooperative  Airway & Oxygen Therapy: Patient Spontanous Breathing and Patient connected to face mask oxygen  Post-op Assessment: Report given to RN and Post -op Vital signs reviewed and stable  Post vital signs: Reviewed and stable  Last Vitals:  Vitals:   06/27/16 1220  BP: (!) 144/49  Pulse: 77  Resp: 18  Temp: 36.7 C    Last Pain:  Vitals:   06/27/16 1314  TempSrc:   PainSc: 10-Worst pain ever      Patients Stated Pain Goal: 4 (27/78/24 2353)  Complications: No apparent anesthesia complications

## 2016-06-27 NOTE — Anesthesia Postprocedure Evaluation (Signed)
Anesthesia Post Note  Patient: Brittany Marks  Procedure(s) Performed: Procedure(s) (LRB): L1 - L2 DISCECTOMY (N/A)  Patient location during evaluation: PACU Anesthesia Type: General Level of consciousness: awake and alert Pain management: pain level controlled Vital Signs Assessment: post-procedure vital signs reviewed and stable Respiratory status: spontaneous breathing, nonlabored ventilation, respiratory function stable and patient connected to nasal cannula oxygen Cardiovascular status: blood pressure returned to baseline and stable Postop Assessment: no signs of nausea or vomiting Anesthetic complications: no    Last Vitals:  Vitals:   06/27/16 1740 06/27/16 1807  BP: (!) 157/81 (!) 159/73  Pulse: 78 75  Resp: 14 16  Temp:  36.5 C    Last Pain:  Vitals:   06/27/16 1645  TempSrc:   PainSc: 5                  Tiajuana Amass

## 2016-06-27 NOTE — OR Nursing (Signed)
Dr. Anselm Pancoast called stating that the xray taken at 1510 shows that the correct site of L1-L2 has been found.

## 2016-06-28 ENCOUNTER — Encounter (HOSPITAL_COMMUNITY): Payer: Self-pay | Admitting: Orthopedic Surgery

## 2016-06-28 DIAGNOSIS — M5126 Other intervertebral disc displacement, lumbar region: Secondary | ICD-10-CM | POA: Diagnosis not present

## 2016-06-28 NOTE — Evaluation (Signed)
Occupational Therapy Evaluation/Discharge Patient Details Name: Brittany Marks MRN: [id number] DOB: 10/11/1940 Today's Date: 06/28/2016    History of Present Illness Patient is a 75 y/o female with hx of RTC surgery presents s/p L1 - L2 DISCECTOMY.    Clinical Impression   PTA, pt was independent with ADLs and used [facility] for mobility. Pt currently requires supervision for all ADLs and basic transfers due to impulsivity and decreased adherence to back precautions. Educated pt on back precautions, brace wear protocol, compensatory strategies for ADLs and use of AE. Pt plans to d/c home with intermittent assistance from her daughter. All education has been completed and pt has no further questions. Pt with no further acute OT needs. OT signing off.    Follow Up Recommendations  No OT follow up;Supervision - Intermittent    Equipment Recommendations  None recommended by OT    Recommendations for Other Services       Precautions / Restrictions Precautions Precautions: Back;Fall Precaution Booklet Issued: Yes (comment) Precaution Comments: Pt able to recall 3/3 back precautions at beginning of session. Reviewed handout and precautions during functional activities. Required Braces or Orthoses: Spinal Brace Spinal Brace: Lumbar corset;Applied in sitting position Restrictions Weight Bearing Restrictions: No      Mobility Bed Mobility Overal bed mobility: Needs Assistance Bed Mobility: Rolling;Sidelying to Sit Rolling: Supervision Sidelying to sit: Supervision       General bed mobility comments: Pt up in chair on OT arrival. Pt verbalized understanding of log roll technique. Pt reported rolling to L side was easier due to rotator cuff injury.  Transfers Overall transfer level: Needs assistance Equipment used: None Transfers: Sit to/from Stand Sit to Stand: Supervision         General transfer comment: Supervision for safety. Cues to adhere to precautions and to wear brace  at all times when up on feet.    Balance Overall balance assessment: Needs assistance Sitting-balance support: No upper extremity supported;Feet supported Sitting balance-Leahy Scale: Good Sitting balance - Comments: Some assist to donn LSO   Standing balance support: No upper extremity supported;During functional activity Standing balance-Leahy Scale: Good Standing balance comment: Some UE needed at times due to weakness/fatigue.                            ADL Overall ADL's : Needs assistance/impaired     Grooming: Wash/dry hands;Supervision/safety;Standing   Upper Body Bathing: Supervision/ safety;Sitting   Lower Body Bathing: Supervison/ safety;Sit to/from stand;Cueing for compensatory techniques Lower Body Bathing Details (indicate cue type and reason): pt able to cross ankle-over-knee; educated on use of long-handled sponge Upper Body Dressing : Supervision/safety;Sitting Upper Body Dressing Details (indicate cue type and reason): educated on dressing techniques for rotator cuff injuries Lower Body Dressing: Supervision/safety;Sit to/from stand;Cueing for compensatory techniques Lower Body Dressing Details (indicate cue type and reason): able to cross ankle-over-knee Toilet Transfer: Supervision/safety;Ambulation;Regular Toilet   Toileting- Water quality scientist and Hygiene: Supervision/safety;Sit to/from stand Toileting - Clothing Manipulation Details (indicate cue type and reason): educated on use of wet wipes for pericare Tub/ Shower Transfer: Walk-in shower;Supervision/safety;Ambulation   Functional mobility during ADLs: Supervision/safety General ADL Comments: Pt somehwat impulsive and attempting to complete ADLs without wearing back brace. Required cues for safety and max cues to adhere to back precautions.     Vision Vision Assessment?: No apparent visual deficits   Perception     Praxis      Pertinent Vitals/Pain Pain Assessment: 0-10 Pain Score:  2  Faces Pain Scale: Hurts a little bit Pain Location: back with movement Pain Descriptors / Indicators: Operative site guarding;Sore Pain Intervention(s): Monitored during session;Repositioned     Hand Dominance Right   Extremity/Trunk Assessment Upper Extremity Assessment Upper Extremity Assessment: Overall WFL for tasks assessed   Lower Extremity Assessment Lower Extremity Assessment: Overall WFL for tasks assessed   Cervical / Trunk Assessment Cervical / Trunk Assessment: Other exceptions Cervical / Trunk Exceptions: s/p spine surgery   Communication Communication Communication: No difficulties   Cognition Arousal/Alertness: Awake/alert Behavior During Therapy: Restless;Impulsive Overall Cognitive Status: Within Functional Limits for tasks assessed                     General Comments       Exercises       Shoulder Instructions      Home Living Family/patient expects to be discharged to:: Private residence Living Arrangements: Children;Other relatives Available Help at Discharge: Family;Available PRN/intermittently Type of Home: House Home Access: Stairs to enter [employer] of Steps: 4-5 Entrance Stairs-Rails: Right Home Layout: One level     Bathroom Shower/Tub: Occupational psychologist: Standard     Home Equipment: Cane - single point;Walker - 2 wheels;Shower seat - built in          Prior Functioning/Environment Level of Independence: Independent with assistive device(s)        Comments: Uses SPC PTA. Driving, doing IADLs. Pt will have daughter and friend check on her during the day.        OT Problem List: Impaired balance (sitting and/or standing);Decreased safety awareness;Decreased knowledge of use of DME or AE;Decreased knowledge of precautions;Pain   OT Treatment/Interventions:      OT Goals(Current goals can be found in the care plan section) Acute Rehab OT Goals Patient Stated Goal: to get back home  now OT Goal Formulation: With patient Time For Goal Achievement: 07/12/16 Potential to Achieve Goals: Good  OT Frequency:     Barriers to D/C:            Co-evaluation              End of Session Equipment Utilized During Treatment: Back brace Nurse Communication: Mobility status  Activity Tolerance: Patient tolerated treatment well Patient left: in chair;with call bell/phone within reach   Time: 0815-0832 OT Time Calculation (min): 17 min Charges:  OT General Charges $OT Visit: 1 Procedure OT Evaluation $OT Eval Low Complexity: 1 Procedure G-Codes: OT G-codes **NOT FOR INPATIENT CLASS** Functional Assessment Tool Used: clinical judgement Functional Limitation: Self care Self Care Current Status (Z3007): At least 1 percent but less than 20 percent impaired, limited or restricted Self Care Goal Status (M2263): At least 1 percent but less than 20 percent impaired, limited or restricted Self Care Discharge Status [phone]): At least 1 percent but less than 20 percent impaired, limited or restricted  Redmond Baseman, OTR/L Pager: [phone] 06/28/2016, 9:13 AM

## 2016-06-28 NOTE — Progress Notes (Signed)
    Subjective: 1 Day Post-Op Procedure(s) (LRB): L1 - L2 DISCECTOMY (N/A) Patient reports pain as 3 on 0-10 scale.   Denies CP or SOB.  Voiding without difficulty. Positive flatus.  Pt has been ambulating well.  Nurse informed us that pt had elevated BS last night.  Pt does not have DM.  Pt refused insulin last night Objective: Vital signs in last 24 hours: Temp:  [97.7 F (36.5 C)-98.7 F (37.1 C)] 98.7 F (37.1 C) (09/21 0400) Pulse Rate:  [75-92] 86 (09/21 0400) Resp:  [14-18] 16 (09/21 0400) BP: (114-188)/(49-91) 114/69 (09/21 0400) SpO2:  [90 %-97 %] 95 % (09/21 0400) Weight:  [74.8 kg (165 lb)] 74.8 kg (165 lb) (09/20 1221)  Intake/Output from previous day: 09/20 0701 - 09/21 0700 In: 1000 [I.V.:1000] Out: 75 [Blood:75] Intake/Output this shift: No intake/output data recorded.  Labs:  Recent Labs  06/27/16 1326  HGB 14.7    Recent Labs  06/27/16 1326  WBC 14.3*  RBC 4.56  HCT 43.2  PLT 332   No results for input(s): NA, K, CL, CO2, BUN, CREATININE, GLUCOSE, CALCIUM in the last 72 hours. No results for input(s): LABPT, INR in the last 72 hours.  Physical Exam: Neurologically intact ABD soft Sensation intact distally Dorsiflexion/Plantar flexion intact Incision: no drainage Compartment soft  Assessment/Plan: 1 Day Post-Op Procedure(s) (LRB): L1 - L2 DISCECTOMY (N/A) Advance diet Up with therapy  Pt able to DC after cleared by PT Advised the pt to f/u with PCP about elevated blood sugar while inpatient   [place], Darla Lesches for Dr. Melina Schools [facility] [phone] 06/28/2016, 7:01 AM    Patient ID: Brittany Marks, female   DOB: December 15, 1940, 75 y.o.   MRN: [id number]

## 2016-06-28 NOTE — Progress Notes (Signed)
Pt refused Home Health PT orders. Holli Humbles, RN

## 2016-06-28 NOTE — Evaluation (Addendum)
Physical Therapy Evaluation Patient Details Name: Brittany Marks MRN: [id number] DOB: 1941-03-13 Today's Date: 06/28/2016   History of Present Illness  Patient is a 75 y/o female with hx of RTC surgery presents s/p L1 - L2 DISCECTOMY.   Clinical Impression  Patient presents with pain and post surgical deficits s/p above surgery. Tolerated gait training and stair training with Min guard assist for safety. Pt fatigues quickly requiring frequent standing rest breaks. Pt mildly unsteady so encouraged use of SPC at home for stability. Pt restless and needs to be constantly moving. Education re: mobility progression, back precautions, positioning and functional activities while adhering to precautions etc. Will have support from daughter and friend at d/c. Will follow acutely to maximize independence and mobility prior to return home.    Follow Up Recommendations Outpatient PT;Supervision - Intermittent    Equipment Recommendations  None recommended by PT    Recommendations for Other Services OT consult     Precautions / Restrictions Precautions Precautions: Back Precaution Booklet Issued: No Precaution Comments: Reviewed back precautions. Required Braces or Orthoses: Spinal Brace Spinal Brace: Applied in sitting position;Lumbar corset Restrictions Weight Bearing Restrictions: No      Mobility  Bed Mobility Overal bed mobility: Needs Assistance Bed Mobility: Rolling;Sidelying to Sit Rolling: Supervision Sidelying to sit: Supervision       General bed mobility comments: HOB flat, no use of rails to simulate home. Cues for log roll technique. Increased time.  Transfers Overall transfer level: Needs assistance Equipment used: None Transfers: Sit to/from Stand Sit to Stand: Supervision         General transfer comment: Supervision for safety. Cues to adhere to precautions. Transferred to chair post ambulation bout.  Ambulation/Gait Ambulation/Gait assistance: Min  guard Ambulation Distance (Feet): 200 Feet Assistive device: None Gait Pattern/deviations: Step-through pattern;Decreased stride length;Decreased stance time - right;Narrow base of support Gait velocity: decreased Gait velocity interpretation: <1.8 ft/sec, indicative of risk for recurrent falls General Gait Details: Slow, mildly unsteady gait when fatigued. Multiple short standing rest breaks due to fatigue.   Stairs Stairs: Yes Stairs assistance: Min guard Stair Management: Alternating pattern;One rail Left Number of Stairs: 4 General stair comments: Cues for technique and safety.   Wheelchair Mobility    Modified Rankin (Stroke Patients Only)       Balance Overall balance assessment: Needs assistance Sitting-balance support: Feet supported;No upper extremity supported Sitting balance-Leahy Scale: Good Sitting balance - Comments: Some assist to donn LSO   Standing balance support: During functional activity;Single extremity supported Standing balance-Leahy Scale: Fair Standing balance comment: Some UE needed at times due to weakness/fatigue.                             Pertinent Vitals/Pain Pain Assessment: Faces Faces Pain Scale: Hurts a little bit Pain Location: back with mobility Pain Descriptors / Indicators: Sore;Operative site guarding Pain Intervention(s): Monitored during session;Repositioned    Home Living Family/patient expects to be discharged to:: Private residence Living Arrangements: Children;Other relatives Available Help at Discharge: Family;Available PRN/intermittently Type of Home: House Home Access: Stairs to enter Entrance Stairs-Rails: Right Entrance Stairs-Number of Steps: 4-5 Home Layout: One level Home Equipment: Cane - single point;Walker - 2 wheels;Shower seat - built in      Prior Function Level of Independence: Independent with assistive device(s)         Comments: Uses SPC PTA. Driving, doing IADLs. Pt will have  daughter and friend check on her  during the day.     Hand Dominance        Extremity/Trunk Assessment   Upper Extremity Assessment: Defer to OT evaluation           Lower Extremity Assessment: Generalized weakness      Cervical / Trunk Assessment: Other exceptions  Communication   Communication: No difficulties  Cognition Arousal/Alertness: Awake/alert Behavior During Therapy: WFL for tasks assessed/performed;Restless (Cannot sit still) Overall Cognitive Status: Within Functional Limits for tasks assessed                      General Comments      Exercises     Assessment/Plan    PT Assessment Patient needs continued PT services  PT Problem List Decreased strength;Decreased mobility;Decreased knowledge of precautions;Decreased activity tolerance;Decreased balance;Pain          PT Treatment Interventions DME instruction;Therapeutic activities;Therapeutic exercise;Stair training;Gait training;Functional mobility training;Balance training;Patient/family education    PT Goals (Current goals can be found in the Care Plan section)  Acute Rehab PT Goals Patient Stated Goal: to get back home now PT Goal Formulation: With patient Time For Goal Achievement: 07/12/16 Potential to Achieve Goals: Good    Frequency Min 5X/week   Barriers to discharge Decreased caregiver support;Inaccessible home environment lives alone; stairs to enter home    Co-evaluation               End of Session Equipment Utilized During Treatment: Gait belt;Back brace Activity Tolerance: Patient tolerated treatment well Patient left: in chair;with call bell/phone within reach Nurse Communication: Mobility status    Functional Assessment Tool Used: clinical judgment Functional Limitation: Mobility: Walking and moving around Mobility: Walking and Moving Around Current Status (Y0349): At least 1 percent but less than 20 percent impaired, limited or restricted Mobility: Walking  and Moving Around Goal Status [phone]): At least 1 percent but less than 20 percent impaired, limited or restricted    Time: 0711-0732 PT Time Calculation (min) (ACUTE ONLY): 21 min   Charges:   PT Evaluation $PT Eval Moderate Complexity: 1 Procedure     PT G Codes:   PT G-Codes **NOT FOR INPATIENT CLASS** Functional Assessment Tool Used: clinical judgment Functional Limitation: Mobility: Walking and moving around Mobility: Walking and Moving Around Current Status (H5391): At least 1 percent but less than 20 percent impaired, limited or restricted Mobility: Walking and Moving Around Goal Status [phone]): At least 1 percent but less than 20 percent impaired, limited or restricted    Bruni 06/28/2016, 8:21 AM Wray Kearns, [place], DPT [phone]

## 2016-06-28 NOTE — Progress Notes (Signed)
Pt doing well. Pt and daughter given D/C instructions with Rx's, verbal understanding was provided. Pt's incision is clean and dry with no sign of infection. Pt's IV was removed prior to D/C. Pt D/C'd home via wheelchair @ 1000 per MD order. Pt is stable @ D/C and has no other needs at this time.Holli Humbles, RN

## 2016-06-28 NOTE — Op Note (Signed)
NAMEHELYN, SCHWAN             ACCOUNT NO.:  [id number]  MEDICAL RECORD NO.:  [id number]  LOCATION:  3C07C                        FACILITY:  [facility]  PHYSICIAN:  Becci Batty D. Rolena Infante, M.D. DATE OF BIRTH:  [birth year]  DATE OF PROCEDURE:  06/27/2016 DATE OF DISCHARGE:                              OPERATIVE REPORT   PREOPERATIVE DIAGNOSIS:  Large, acute right-sided L1-2 disk herniation.  POSTOPERATIVE DIAGNOSIS:  Large, acute right-sided L1-2 disk herniation.  OPERATIVE PROCEDURES:  L1 to central decompression with right lateral recess, facetectomy, and diskectomy.  COMPLICATIONS:  None.  CONDITION:  Stable.  HISTORY:  This is a very pleasant, 75 year old woman, who has had multiple previous lumbar surgeries, who presents with acute severe right proximal thigh pain.  Imaging studies demonstrated a large L1-2 disk herniation with a mass emanating from the disk at L1-2 and going caudal almost contacting the L2-3 disk space.  Due to the size and severity of her pain, we elected to move forward with surgery.  All appropriate risks, benefits, and alternatives were discussed and consent was obtained.  OPERATIVE NOTE:  The patient was brought to the operating room and placed supine on the operating table.  After successful induction of general anesthesia and endotracheal intubation, TEDs, SCDs were applied. She was turned prone onto the [place] frame.  All bony prominences were well padded.  The right upper extremity could not be placed in an overhead position, so it was tucked at the side.  Kyphosis was built up into the [place] frame, and the back was prepped and draped in a standard fashion.  Time-out was then taken confirming the patient, procedure, and all other pertinent important data.  Once this was completed, 2 needles were placed into the back and an x-ray was taken to confirm incision site.  Once this was done, the incision was mapped out and infiltrated with 0.25% Marcaine.   Midline incision was made, centered over the L1-2 disk space.  Sharp dissection was carried out down to the deep fascia.  The deep fascia was sharply incised, and I stripped the paraspinal muscles to expose the L1 and L2 spinous processes.  McCullough self-retaining retractors were placed, and a Penfield #4 was placed underneath the L1 lamina.  A second x-ray was taken at this time to confirm that I was at the L1-2 level.  Once this was done, a double-action Leksell rongeur was used to remove the inferior portion of the L1 spinous process.  Because of the narrow nature, I elected to do a central decompression so that I can get out laterally and remove the large fragment of disk.  Using a 2 and 3 mm Kerrison punch, I performed a central laminotomy of L1 and then used my Penfield #4 to dissect underneath the ligamentum flavum.  Once I had a plane between the thecal sac and ligamentum flavum, I used my 3 mm Kerrison punch and 2 mm Kerrison punch to resect the central ligamentum flavum.  I then resected some also into the left lateral recess to allow for easier retraction and mobilization so that I can visualize the very large right-sided disk herniation.  On the right side, I continued my lateral decompression until the  inferior L1 facet was removed.  This allowed me excellent visualization and allowed me not to have excessive retraction on the thecal sac.  At this point, I could identify the pedicle and the exiting nerve root as well as the disk fragment.  I used my nerve hook.  An annulotomy was performed with a #15 blade scalpel, and then, I used my nerve hook to sweep and deliver multiple large fragments of disk material and removed them with a micropituitary rongeur.  I then identified the inferior L portion of the L1-2 disk space.  With my nerve hook, I swept from the inferior aspect of the disk inferiorly until I could palpate the superior aspect of the L1-2 disk. This was the exact  area, where the large fragments of disk material were located based on the MRI.  Once I had swept circumferentially multiple times and delivered all the fragments and the nerve root was now no longer under tension, I could easily palpate out into the foramen and I could palpate superiorly above the disk space and centrally underneath the thecal sac.  With the diskectomy complete, I then used bipolar electrocautery and FloSeal to obtain and maintain hemostasis.  The wound was copiously irrigated with normal saline.  We then closed in a layered fashion with interrupted #1 Vicryl sutures, 2-0 Vicryl sutures, and a 3- 0 Monocryl.  Steri-Strips and dry dressing were applied.  The patient was ultimately extubated and transferred to the PACU without incident. At the end of the case, all needle and sponge counts were correct. There were no adverse intraoperative events.     Eriel Doyon D. Rolena Infante, M.D.     DDB/MEDQ  D:  06/27/2016  T:  06/28/2016  Job:  [id number]

## 2016-07-03 NOTE — Discharge Summary (Signed)
Physician Discharge Summary  Patient ID: Brittany Marks MRN: [id number] DOB/AGE: [birth year] 75 y.o.  Admit date: 06/27/2016 Discharge date: 07/03/2016  Admission Diagnoses:  DDD Lumbar  Discharge Diagnoses:  Active Problems:   Lumbar disc herniation   Past Medical History:  Diagnosis Date  . Constipation    pain medication  . Left rotator cuff tear   . Right rotator cuff tear     Surgeries: Procedure(s): L1 - L2 DISCECTOMY on 06/27/2016   Consultants (if any):   Discharged Condition: Improved  Hospital Course: Brittany Marks is an 75 y.o. female who was admitted 06/27/2016 with a diagnosis of DDD Lumbar and went to the operating room on 06/27/2016 and underwent the above named procedures.  Post op day 1 pt reports pain is well controlled on oral medications.  Pt ambulating well.  Pt urinating w/o difficulty.  Pt cleared by PT for DC.  Pt voiced desire to DC home.  Pt has family and friends to assist.  Pt did have an elevated blood sugar post op day 0.  Pt does not carry the diagnosis of DM.  Advised pt to f/u with PCP for lab work.  She was given perioperative antibiotics:  Anti-infectives    Start     Dose/Rate Route Frequency Ordered Stop   06/27/16 1830  ceFAZolin (ANCEF) IVPB 1 g/50 mL premix     1 g 100 mL/hr over 30 Minutes Intravenous Every 8 hours 06/27/16 1752 06/28/16 0215   06/27/16 1248  ceFAZolin (ANCEF) IVPB 2g/100 mL premix     2 g 200 mL/hr over 30 Minutes Intravenous 30 min pre-op 06/27/16 1248 06/27/16 1452    .  She was given sequential compression devices, early ambulation, and TED for DVT prophylaxis.  She benefited maximally from the hospital stay and there were no complications.    Recent vital signs:  Vitals:   06/28/16 0400 06/28/16 0815  BP: 114/69 118/71  Pulse: 86 84  Resp: 16 18  Temp: 98.7 F (37.1 C) 98.7 F (37.1 C)    Recent laboratory studies:  Lab Results  Component Value Date   HGB 14.7 06/27/2016   HGB 14.4  07/23/2013   HGB 14.6 12/19/2011   Lab Results  Component Value Date   WBC 14.3 (H) 06/27/2016   PLT 332 06/27/2016   Lab Results  Component Value Date   INR 1.01 07/23/2013   Lab Results  Component Value Date   NA 135 07/23/2013   K 4.2 07/23/2013   CL 100 07/23/2013   CO2 27 07/23/2013   BUN 19 07/23/2013   CREATININE 0.75 07/23/2013   GLUCOSE 101 (H) 07/23/2013    Discharge Medications:     Medication List    STOP taking these medications   HYDROmorphone 2 MG tablet Commonly known as:  DILAUDID   ibuprofen 200 MG tablet Commonly known as:  ADVIL,MOTRIN   predniSONE 5 MG (48) Tbpk tablet Commonly known as:  STERAPRED UNI-PAK 48 TAB     TAKE these medications   b complex vitamins tablet Take 1 tablet by mouth daily.   COD LIVER OIL PO Take 1 tablet by mouth daily.   methocarbamol 500 MG tablet Commonly known as:  ROBAXIN Take 1 tablet (500 mg total) by mouth 3 (three) times daily as needed for muscle spasms.   multivitamin tablet Take 1 tablet by mouth daily.   ondansetron 4 MG tablet Commonly known as:  ZOFRAN Take 1 tablet (4 mg total) by mouth every  8 (eight) hours as needed for nausea or vomiting. What changed:  See the new instructions.   oxyCODONE-acetaminophen 10-325 MG tablet Commonly known as:  PERCOCET Take 1 tablet by mouth every 4 (four) hours as needed for pain.   oxymetazoline 0.05 % nasal spray Commonly known as:  AFRIN Place 2 sprays into the nose 2 (two) times daily as needed for congestion.   sodium chloride 0.65 % nasal spray Commonly known as:  OCEAN Place 1 spray into the nose 2 (two) times daily as needed for congestion.   SYSTANE 0.4-0.3 % Soln Generic drug:  Polyethyl Glycol-Propyl Glycol Apply 1 drop to eye 2 (two) times daily as needed (dry eyes).   vitamin C 500 MG tablet Commonly known as:  ASCORBIC ACID Take 500 mg by mouth daily.   vitamin E 400 UNIT capsule Take 400 Units by mouth daily.        Diagnostic Studies: Dg Lumbar Spine 1 View  Result Date: 06/27/2016 CLINICAL DATA:  Elective surgery. EXAM: LUMBAR SPINE - 1 VIEW COMPARISON:  07/29/2013. Subsequent exam which was ordered stat and has already been dictated FINDINGS: Spinal needles project over the articular processes of L1 and L2. Diffuse degenerative disc narrowing and endplate ridging. IMPRESSION: Intraoperative localization as described. Electronically Signed   By: Monte Fantasia M.D.   On: 06/27/2016 15:51   Dg Lumbar Spine 1 View  Result Date: 06/27/2016 CLINICAL DATA:  Surgery, elected.  Discectomy. EXAM: LUMBAR SPINE - 1 VIEW COMPARISON:  06/27/2016 at 1454 hours and CT lumbar myelogram on 06/10/2013 FINDINGS: There is a surgical marker posterior to the L1-L2 level. Diffuse disc space narrowing throughout the lumbar spine. Aortic atherosclerotic calcifications. IMPRESSION: Surgical marking at L1-L2. Electronically Signed   By: Markus Daft M.D.   On: 06/27/2016 15:26    Disposition: 01-Home or Self Care Post op medications provided Pt will present to clinic in 2 weeks Pt will f/u with PCP about one time elevated BS while inpatient.   Follow-up Information    Dahlia Bailiff, MD In 2 weeks.   Specialty:  Orthopedic Surgery Why:  If symptoms worsen, For suture removal, For wound re-check Contact information: [address] [place]  [address] [phone]            Signed: Valinda Hoar 07/03/2016, 7:34 AM

## 2017-02-06 ENCOUNTER — Other Ambulatory Visit: Payer: Self-pay | Admitting: Acute Care

## 2017-02-06 DIAGNOSIS — F17210 Nicotine dependence, cigarettes, uncomplicated: Principal | ICD-10-CM

## 2017-02-18 ENCOUNTER — Encounter: Payer: Federal, State, Local not specified - PPO | Admitting: Acute Care

## 2018-12-22 ENCOUNTER — Telehealth: Payer: Self-pay | Admitting: Oncology

## 2018-12-22 ENCOUNTER — Other Ambulatory Visit: Payer: Self-pay | Admitting: Oncology

## 2018-12-22 DIAGNOSIS — Z1211 Encounter for screening for malignant neoplasm of colon: Secondary | ICD-10-CM

## 2018-12-22 DIAGNOSIS — R937 Abnormal findings on diagnostic imaging of other parts of musculoskeletal system: Secondary | ICD-10-CM

## 2018-12-22 NOTE — Telephone Encounter (Signed)
Scheduled appt pe r3/16 sch message - unable to reach pt - left message with appt date and time

## 2018-12-24 NOTE — Progress Notes (Signed)
Brittany Marks  [phone] [phone]    ID: Brittany Marks DOB: 09-28-1941  MR#: [id number]  IPJ#:[id number]  Patient Care Team: Patient, No Pcp Per as PCP - General (General Practice) Justice Britain, MD as Consulting Physician (Orthopedic Surgery) Magrinat, Virgie Dad, MD as Consulting Physician (Oncology) OTHER MD:   CHIEF COMPLAINT: Pathologic fracture left humerus  CURRENT TREATMENT: Evaluate for occult carcinoma   HISTORY OF CURRENT ILLNESS:  I was called on 12/22/2018 by Dr. Lennette Bihari supple regarding Brittany Marks, who per his account has no prior history of cancer.  She does have a history of ongoing smoking and some shortness of breath symptoms.  She presented with a pathologic fracture of the proximal left humerus.  He feels this requires nothing but slinging for the time being, but that it is probably malignant and that she needs further work-up.   Today the patient confirms that this break occurred without trauma.  She states she reached under her sink and all of a sudden had a terrific pain in the left arm.  She is taking narcotics for this and is accordingly somewhat constipated.  She is unable to drive as her left arm is in a sling and she cannot wear a seatbelt.  She has insomnia because she is very concerned about the cancer possibility.  The patient's subsequent history is as detailed below.   INTERVAL HISTORY: Brittany Marks was evaluated in the cancer clinic on 12/26/2018 accompanied by her daughter Brittany Marks.   REVIEW OF SYSTEMS: Brittany Marks is in a wheelchair today, which she states is because she feels unsteady on her feet due to taking percocet for her pain. Brittany Marks notes that she is having bilateral ankle swelling with no known cause. The patient denies unusual headaches, visual changes, nausea, vomiting, stiff neck, or dizziness. There has been no cough, phlegm production, or pleurisy, no chest pain or pressure. The patient denies fever,  rash, or bleeding. A detailed review of systems was otherwise entirely negative.   PAST MEDICAL HISTORY: Past Medical History:  Diagnosis Date  . Cataract    Bilateral  . Constipation    pain medication  . Left rotator cuff tear   . Right rotator cuff tear      PAST SURGICAL HISTORY: Past Surgical History:  Procedure Laterality Date  . CATARACT EXTRACTION W/ INTRAOCULAR LENS  IMPLANT, BILATERAL  2012  . DECOMPRESSIVE LUMBAR LAMINECTOMY LEVEL 2 N/A 07/29/2013   Procedure: LUMBAR LAMINECTOMY, DECOMPRESSION LUMBAR THREE TO FOUR, FOUR TO FIVE microdiscectomy l3,4 right;  Surgeon: Tobi Bastos, MD;  Location: [facility];  Service: Orthopedics;  Laterality: N/A;  . I & D SUPERIOR RIGHT SHOULDER AND CLOSURE WOUND  01-10-2011   S/P ROTATOR CUFF REPAIR  . LUMBAR LAMINECTOMY  1970'S  . LUMBAR LAMINECTOMY/DECOMPRESSION MICRODISCECTOMY N/A 06/27/2016   Procedure: L1 - L2 DISCECTOMY;  Surgeon: Melina Schools, MD;  Location: [facility];  Service: Orthopedics;  Laterality: N/A;  . [place]  . RIGHT SHOULDER ARTHROSCOPY/ OPEN DISTAL CLAVICLE RESECTION/ SAD/ OPEN ROTATOR CUFF REPAIR  11-28-2010  . SHOULDER OPEN ROTATOR CUFF REPAIR  12/19/2011   Procedure: ROTATOR CUFF REPAIR SHOULDER OPEN;  Surgeon: Magnus Sinning, MD;  Location: [facility];  Service: Orthopedics;  Laterality: Right;  RIGHT RECURRENT OPEN REPAIR OF THE ROTATOR CUFF WITH TISSUE MEND GRAFTANTERIOR CHROMIOECTOMY  . VAGINAL HYSTERECTOMY  1979    FAMILY HISTORY: Family History  Problem Relation Age of Onset  . Congestive Heart Failure Mother   .  Congestive Heart Failure Father    Brittany Marks's father died from congestive heart failure in his 75's. Patients' mother died from congestive heart failure in her late 22's. The patient has 1 brother. Patient denies anyone in her family having breast, ovarian, prostate, or pancreatic cancer.    GYNECOLOGIC HISTORY:  No LMP recorded. Patient has had a  hysterectomy. 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 She lives alone, with her cat, Brittany Marks. [name]'s daughter, Brittany Marks, is a Educational psychologist at [employer]. [name] has no grandchildren. She does not attend a church, Glass blower/designer, or mosque.    ADVANCED DIRECTIVES:     HEALTH MAINTENANCE: Social History   Tobacco Use  . Smoking status: Current Every Day Smoker    Packs/day: 1.00    Years: 40.00    Pack years: 40.00    Types: Cigarettes  . Smokeless tobacco: Never Used  . Tobacco comment: trying to quit now  Substance Use Topics  . Alcohol use: Yes    Comment: RARE  . Drug use: No    Colonoscopy: Never  PAP: Remote  Bone density: Never Mammography: Remote  No Known Allergies  Current Outpatient Medications  Medication Sig Dispense Refill  . b complex vitamins tablet Take 1 tablet by mouth daily.    . COD LIVER OIL PO Take 1 tablet by mouth daily.     . Multiple Vitamin (MULTIVITAMIN) tablet Take 1 tablet by mouth daily.    Marland Kitchen oxyCODONE-acetaminophen (PERCOCET) 10-325 MG tablet Take 1 tablet by mouth every 4 (four) hours as needed for pain. 60 tablet 0  . oxymetazoline (AFRIN) 0.05 % nasal spray Place 2 sprays into the nose 2 (two) times daily as needed for congestion.    Vladimir Faster Glycol-Propyl Glycol (SYSTANE) 0.4-0.3 % SOLN Apply 1 drop to eye 2 (two) times daily as needed (dry eyes).    . sodium chloride (OCEAN) 0.65 % nasal spray Place 1 spray into the nose 2 (two) times daily as needed for congestion.    . vitamin C (ASCORBIC ACID) 500 MG tablet Take 500 mg by mouth daily.    . vitamin E 400 UNIT capsule Take 400 Units by mouth daily.    . ondansetron (ZOFRAN) 4 MG tablet Take 1 tablet (4 mg total) by mouth every 8 (eight) hours as needed for nausea  or vomiting. (Patient not taking: Reported on 12/26/2018) 20 tablet 0   No current facility-administered medications for this visit.      OBJECTIVE: Older white woman examined in a wheelchair  Vitals:   12/26/18 1545  BP: (!) 154/66  Pulse: 87  Resp: 18  Temp: 98.2 F (36.8 C)  SpO2: 92%     Body mass index is 26.9 kg/m.   Wt Readings from Last 3 Encounters:  12/26/18 147 lb 1.6 oz (66.7 kg)  06/27/16 165 lb (74.8 kg)  07/29/13 163 lb (73.9 kg)      ECOG FS:2 - Symptomatic, <50% confined to bed  Ocular: Sclerae unicteric, pupils round and equal Ear-nose-throat: Full upper plate Lymphatic: No cervical or supraclavicular adenopathy Lungs no rales or rhonchi Heart regular rate and rhythm Abd soft, nontender, positive bowel sounds, no masses palpated MSK no focal spinal tenderness, grade 1 bilateral lower extremity edema Neuro: non-focal, well-oriented, appropriate affect Breasts: No  masses palpated, no skin or nipple changes of concern, both axillae are benign.   LAB RESULTS:  CMP     Component Value Date/Time   NA 132 (L) 12/26/2018 1523   K 3.6 12/26/2018 1523   CL 98 12/26/2018 1523   CO2 22 12/26/2018 1523   GLUCOSE 101 (H) 12/26/2018 1523   BUN 14 12/26/2018 1523   CREATININE 0.76 12/26/2018 1523   CALCIUM 8.5 (L) 12/26/2018 1523   PROT 7.2 12/26/2018 1523   ALBUMIN 3.8 12/26/2018 1523   AST 25 12/26/2018 1523   ALT 19 12/26/2018 1523   ALKPHOS 71 12/26/2018 1523   BILITOT 0.5 12/26/2018 1523   GFRNONAA >60 12/26/2018 1523   GFRAA >60 12/26/2018 1523    No results found for: TOTALPROTELP, ALBUMINELP, A1GS, A2GS, BETS, BETA2SER, GAMS, MSPIKE, SPEI  No results found for: KPAFRELGTCHN, LAMBDASER, KAPLAMBRATIO  Lab Results  Component Value Date   WBC 13.2 (H) 12/26/2018   NEUTROABS 9.9 (H) 12/26/2018   HGB 13.1 12/26/2018   HCT 38.9 12/26/2018   MCV 93.1 12/26/2018   PLT 264 12/26/2018    @LASTCHEMISTRY @  No results found for: LABCA2  No  components found for: [id number]  No results for input(s): INR in the last 168 hours.  No results found for: LABCA2  No results found for: NWG956  No results found for: OZH086  No results found for: VHQ469  No results found for: CA2729  No components found for: HGQUANT  No results found for: CEA1 / No results found for: CEA1   No results found for: AFPTUMOR  No results found for: CHROMOGRNA  No results found for: PSA1  Appointment on 12/26/2018  Component Date Value Ref Range Status  . Sodium 12/26/2018 132* 135 - 145 mmol/L Final  . Potassium 12/26/2018 3.6  3.5 - 5.1 mmol/L Final  . Chloride 12/26/2018 98  98 - 111 mmol/L Final  . CO2 12/26/2018 22  22 - 32 mmol/L Final  . Glucose, Bld 12/26/2018 101* 70 - 99 mg/dL Final  . BUN 12/26/2018 14  8 - 23 mg/dL Final  . Creatinine, Ser 12/26/2018 0.76  0.44 - 1.00 mg/dL Final  . Calcium 12/26/2018 8.5* 8.9 - 10.3 mg/dL Final  . Total Protein 12/26/2018 7.2  6.5 - 8.1 g/dL Final  . Albumin 12/26/2018 3.8  3.5 - 5.0 g/dL Final  . AST 12/26/2018 25  15 - 41 U/L Final  . ALT 12/26/2018 19  0 - 44 U/L Final  . Alkaline Phosphatase 12/26/2018 71  38 - 126 U/L Final  . Total Bilirubin 12/26/2018 0.5  0.3 - 1.2 mg/dL Final  . GFR calc non Af Amer 12/26/2018 >60  >60 mL/min Final  . GFR calc Af Amer 12/26/2018 >60  >60 mL/min Final  . Anion gap 12/26/2018 12  5 - 15 Final   Performed at [facility], [place] [address] [place], [place] [address]  . WBC 12/26/2018 13.2* 4.0 - 10.5 K/uL Final  . RBC 12/26/2018 4.18  3.87 - 5.11 MIL/uL Final  . Hemoglobin 12/26/2018 13.1  12.0 - 15.0 g/dL Final  . HCT 12/26/2018 38.9  36.0 - 46.0 % Final  . MCV 12/26/2018 93.1  80.0 - 100.0 fL Final  . MCH 12/26/2018 31.3  26.0 - 34.0 pg Final  . MCHC 12/26/2018 33.7  30.0 - 36.0 g/dL Final  . RDW 12/26/2018 12.4  11.5 - 15.5 % Final  . Platelets 12/26/2018 264  150 - 400 K/uL Final  .  nRBC 12/26/2018 0.0  0.0 - 0.2 % Final   . Neutrophils Relative % 12/26/2018 75  % Final  . Neutro Abs 12/26/2018 9.9* 1.7 - 7.7 K/uL Final  . Lymphocytes Relative 12/26/2018 16  % Final  . Lymphs Abs 12/26/2018 2.1  0.7 - 4.0 K/uL Final  . Monocytes Relative 12/26/2018 7  % Final  . Monocytes Absolute 12/26/2018 0.9  0.1 - 1.0 K/uL Final  . Eosinophils Relative 12/26/2018 2  % Final  . Eosinophils Absolute 12/26/2018 0.2  0.0 - 0.5 K/uL Final  . Basophils Relative 12/26/2018 0  % Final  . Basophils Absolute 12/26/2018 0.1  0.0 - 0.1 K/uL Final  . Immature Granulocytes 12/26/2018 0  % Final  . Abs Immature Granulocytes 12/26/2018 0.04  0.00 - 0.07 K/uL Final   Performed at [facility], [address] [place], [place] [address]    (this displays the last labs from the last 3 days)  No results found for: TOTALPROTELP, ALBUMINELP, A1GS, A2GS, BETS, BETA2SER, GAMS, MSPIKE, SPEI (this displays SPEP labs)  No results found for: KPAFRELGTCHN, LAMBDASER, KAPLAMBRATIO (kappa/lambda light chains)  No results found for: HGBA, HGBA2QUANT, HGBFQUANT, HGBSQUAN (Hemoglobinopathy evaluation)   No results found for: LDH  No results found for: IRON, TIBC, IRONPCTSAT (Iron and TIBC)  No results found for: FERRITIN  Urinalysis    Component Value Date/Time   COLORURINE YELLOW 07/23/2013 1332   APPEARANCEUR CLOUDY (A) 07/23/2013 1332   LABSPEC 1.028 07/23/2013 1332   PHURINE 6.0 07/23/2013 1332   GLUCOSEU NEGATIVE 07/23/2013 1332   HGBUR NEGATIVE 07/23/2013 1332   BILIRUBINUR NEGATIVE 07/23/2013 1332   KETONESUR NEGATIVE 07/23/2013 1332   PROTEINUR NEGATIVE 07/23/2013 1332   UROBILINOGEN 0.2 07/23/2013 1332   NITRITE NEGATIVE 07/23/2013 1332   LEUKOCYTESUR SMALL (A) 07/23/2013 1332     STUDIES:  Scheduled for bone scan 01/01/2019  ELIGIBLE FOR AVAILABLE RESEARCH PROTOCOL:    ASSESSMENT: 78 y.o. [place], [place] woman with ongoing tobacco abuse, with a pathologic left humeral fracture diagnosed  12/22/2018.   PLAN: I spent approximately 60 minutes face to face with Jenniferlynn with more than 50% of that time spent in counseling and coordination of care. Specifically we reviewed the biology of the patient's diagnosis and the specifics of her situation.  She understands there are many possible causes for fracture including minor trauma in the setting of osteoporosis, but that Dr. Augustin Coupe opinion is that this fracture is pathologic meaning very likely due to cancer.  There are more than 200 types of cancer, but of course some are more common than others.  It does not help that Brileigh's health maintenance is so far behind.  She does not have a primary MD, has not had a mammogram in many years, has not had a Pap smear in many years, and has never had a colonoscopy or a bone density.  I wrote for her to have a CT scan of the chest but this was denied by insurance.  I am going to get a chest x-ray today and we will go back to the CT question if there is any abnormality in the chest x-ray.  She is scheduled for a bone scan next week and potentially the chest CT scan could be done the same day.  Obviously in a smoker lung cancer is a primary consideration.  Her breast exam today was unremarkable.  We have a CEA and a CA-27-29 pending.  She may need other studies depending on what these  initial results show.  If the work-up proves to be negative then we will have to consider either further observation or biopsy of the left humeral lesion  Nysha has a good understanding of the overall plan. She agrees with it. She will call with any problems that may develop before her next visit here.    Magrinat, Virgie Dad, MD  12/26/18 4:41 PM Medical Oncology and Hematology [facility] [address] [place], [place] [address] Tel. [phone]    Fax. [phone]   I, Jacqualyn Posey am acting as a Education administrator for Chauncey Cruel, MD.   I, Lurline Del MD, have reviewed the above  documentation for accuracy and completeness, and I agree with the above.

## 2018-12-26 ENCOUNTER — Inpatient Hospital Stay: Payer: Federal, State, Local not specified - PPO | Attending: Oncology | Admitting: Oncology

## 2018-12-26 ENCOUNTER — Inpatient Hospital Stay: Payer: Federal, State, Local not specified - PPO

## 2018-12-26 ENCOUNTER — Encounter: Payer: Self-pay | Admitting: Oncology

## 2018-12-26 ENCOUNTER — Ambulatory Visit (HOSPITAL_COMMUNITY)
Admission: RE | Admit: 2018-12-26 | Discharge: 2018-12-26 | Disposition: A | Discharge status: Home / Self Care | Payer: Federal, State, Local not specified - PPO | Source: Ambulatory Visit | Attending: Oncology | Admitting: Oncology

## 2018-12-26 ENCOUNTER — Other Ambulatory Visit: Payer: Self-pay

## 2018-12-26 DIAGNOSIS — M7989 Other specified soft tissue disorders: Secondary | ICD-10-CM | POA: Diagnosis not present

## 2018-12-26 DIAGNOSIS — R0602 Shortness of breath: Secondary | ICD-10-CM | POA: Insufficient documentation

## 2018-12-26 DIAGNOSIS — K5900 Constipation, unspecified: Secondary | ICD-10-CM | POA: Insufficient documentation

## 2018-12-26 DIAGNOSIS — Z79899 Other long term (current) drug therapy: Secondary | ICD-10-CM | POA: Diagnosis not present

## 2018-12-26 DIAGNOSIS — M84422A Pathological fracture, left humerus, initial encounter for fracture: Secondary | ICD-10-CM | POA: Insufficient documentation

## 2018-12-26 DIAGNOSIS — F17210 Nicotine dependence, cigarettes, uncomplicated: Secondary | ICD-10-CM | POA: Insufficient documentation

## 2018-12-26 DIAGNOSIS — Z1211 Encounter for screening for malignant neoplasm of colon: Secondary | ICD-10-CM

## 2018-12-26 DIAGNOSIS — R937 Abnormal findings on diagnostic imaging of other parts of musculoskeletal system: Secondary | ICD-10-CM

## 2018-12-26 LAB — COMPREHENSIVE METABOLIC PANEL
ALBUMIN: 3.8 g/dL (ref 3.5–5.0)
ALK PHOS: 71 U/L (ref 38–126)
ALT: 19 U/L (ref 0–44)
ANION GAP: 12 (ref 5–15)
AST: 25 U/L (ref 15–41)
BILIRUBIN TOTAL: 0.5 mg/dL (ref 0.3–1.2)
BUN: 14 mg/dL (ref 8–23)
CALCIUM: 8.5 mg/dL — AB (ref 8.9–10.3)
CO2: 22 mmol/L (ref 22–32)
Chloride: 98 mmol/L (ref 98–111)
Creatinine, Ser: 0.76 mg/dL (ref 0.44–1.00)
GFR calc Af Amer: >60 mL/min (ref >60)
GLUCOSE: 101 mg/dL — AB (ref 70–99)
POTASSIUM: 3.6 mmol/L (ref 3.5–5.1)
SODIUM: 132 mmol/L — AB (ref 135–145)
TOTAL PROTEIN: 7.2 g/dL (ref 6.5–8.1)

## 2018-12-26 LAB — CBC WITH DIFFERENTIAL/PLATELET
Abs Immature Granulocytes: 0.04 10*3/uL (ref 0.00–0.07)
BASOS PCT: 0 %
Basophils Absolute: 0.1 10*3/uL (ref 0.0–0.1)
Eosinophils Absolute: 0.2 10*3/uL (ref 0.0–0.5)
Eosinophils Relative: 2 %
HEMATOCRIT: 38.9 % (ref 36.0–46.0)
Hemoglobin: 13.1 g/dL (ref 12.0–15.0)
Immature Granulocytes: 0 %
Lymphocytes Relative: 16 %
Lymphs Abs: 2.1 10*3/uL (ref 0.7–4.0)
MCH: 31.3 pg (ref 26.0–34.0)
MCHC: 33.7 g/dL (ref 30.0–36.0)
MCV: 93.1 fL (ref 80.0–100.0)
MONO ABS: 0.9 10*3/uL (ref 0.1–1.0)
MONOS PCT: 7 %
Neutro Abs: 9.9 10*3/uL — ABNORMAL HIGH (ref 1.7–7.7)
Neutrophils Relative %: 75 %
Platelets: 264 10*3/uL (ref 150–400)
RBC: 4.18 MIL/uL (ref 3.87–5.11)
RDW: 12.4 % (ref 11.5–15.5)
WBC: 13.2 10*3/uL — AB (ref 4.0–10.5)
nRBC: 0 % (ref 0.0–0.2)

## 2018-12-27 LAB — CANCER ANTIGEN 27.29: CA 27.29: 63.6 U/mL — ABNORMAL HIGH (ref 0.0–38.6)

## 2018-12-29 LAB — CEA (IN HOUSE-CHCC): CEA (CHCC-In House): 6.39 ng/mL — ABNORMAL HIGH (ref 0.00–5.00)

## 2019-01-01 ENCOUNTER — Ambulatory Visit (HOSPITAL_COMMUNITY)
Admission: RE | Admit: 2019-01-01 | Discharge: 2019-01-01 | Disposition: A | Discharge status: Home / Self Care | Payer: Federal, State, Local not specified - PPO | Source: Ambulatory Visit | Attending: Oncology | Admitting: Oncology

## 2019-01-01 ENCOUNTER — Encounter (HOSPITAL_COMMUNITY)
Admission: RE | Admit: 2019-01-01 | Discharge: 2019-01-01 | Disposition: A | Discharge status: Home / Self Care | Payer: Federal, State, Local not specified - PPO | Source: Ambulatory Visit | Attending: Oncology | Admitting: Oncology

## 2019-01-01 ENCOUNTER — Other Ambulatory Visit: Payer: Self-pay

## 2019-01-01 DIAGNOSIS — Z1211 Encounter for screening for malignant neoplasm of colon: Secondary | ICD-10-CM | POA: Insufficient documentation

## 2019-01-01 DIAGNOSIS — R937 Abnormal findings on diagnostic imaging of other parts of musculoskeletal system: Secondary | ICD-10-CM | POA: Insufficient documentation

## 2019-01-01 MED ORDER — TECHNETIUM TC 99M MEDRONATE IV KIT
19.2000 | PACK | Freq: Once | INTRAVENOUS | Status: AC | PRN
  Administered 2019-01-01: 19.2 via INTRAVENOUS

## 2019-01-01 MED ORDER — IOHEXOL 300 MG/ML  SOLN
75.0000 mL | Freq: Once | INTRAMUSCULAR | Status: AC | PRN
  Administered 2019-01-01: 75 mL via INTRAVENOUS

## 2019-01-01 MED ORDER — SODIUM CHLORIDE (PF) 0.9 % IJ SOLN
INTRAMUSCULAR | Status: AC
  Filled 2019-01-01: qty 50

## 2019-01-01 NOTE — Progress Notes (Signed)
I called the patient's daughter today and gave her the results of the CAT scan and bone scan yesterday.  The bone scan shows no other sites of disease aside from the known humeral pathologic fracture.  The CT scan to my surprise shows no lung lesions.  There are several lymph nodes that are enlarged but none are easily accessible.  We are going to need to obtain a PET scan and then a biopsy.  I told them that they do not need to come in today.  As soon as I have the PET scan results we will figure out what is the best place to biopsy and then once we have the biopsy results we will make a definitive decision regarding treatment

## 2019-01-02 ENCOUNTER — Inpatient Hospital Stay (HOSPITAL_BASED_OUTPATIENT_CLINIC_OR_DEPARTMENT_OTHER): Payer: Federal, State, Local not specified - PPO | Admitting: Oncology

## 2019-01-02 DIAGNOSIS — R590 Localized enlarged lymph nodes: Secondary | ICD-10-CM | POA: Insufficient documentation

## 2019-01-02 DIAGNOSIS — C8299 Follicular lymphoma, unspecified, extranodal and solid organ sites: Secondary | ICD-10-CM

## 2019-02-24 ENCOUNTER — Other Ambulatory Visit: Payer: Self-pay

## 2019-02-24 ENCOUNTER — Ambulatory Visit (HOSPITAL_COMMUNITY)
Admission: RE | Admit: 2019-02-24 | Discharge: 2019-02-24 | Disposition: A | Discharge status: Home / Self Care | Payer: Federal, State, Local not specified - PPO | Source: Ambulatory Visit | Attending: Oncology | Admitting: Oncology

## 2019-02-24 DIAGNOSIS — C8299 Follicular lymphoma, unspecified, extranodal and solid organ sites: Secondary | ICD-10-CM | POA: Insufficient documentation

## 2019-02-24 DIAGNOSIS — R590 Localized enlarged lymph nodes: Secondary | ICD-10-CM | POA: Diagnosis present

## 2019-02-24 LAB — GLUCOSE, CAPILLARY: Glucose-Capillary: 98 mg/dL (ref 70–99)

## 2019-02-24 MED ORDER — FLUDEOXYGLUCOSE F - 18 (FDG) INJECTION
7.3000 | Freq: Once | INTRAVENOUS | Status: DC | PRN
Start: 2019-02-24 — End: 2019-03-02

## 2019-02-25 DIAGNOSIS — C801 Malignant (primary) neoplasm, unspecified: Secondary | ICD-10-CM | POA: Insufficient documentation

## 2019-02-25 DIAGNOSIS — M84421A Pathological fracture, right humerus, initial encounter for fracture: Secondary | ICD-10-CM | POA: Insufficient documentation

## 2019-02-25 NOTE — Progress Notes (Signed)
I discussed the PET scan with interventional radiology, Dr. Vernard Gambles.  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  I called the patient's daughter Brittany Marks and discussed this with her.  She will let her mother know that they will be getting a call to try to get this biopsy accomplished within the next week or so.  They do have an appointment with me a week from now but likely we will move that back until we get this biopsy results.

## 2019-02-26 ENCOUNTER — Telehealth: Payer: Self-pay | Admitting: Oncology

## 2019-02-26 NOTE — Telephone Encounter (Signed)
Rescheduled 5/27 appt per sch msg. Called and left msg for patient

## 2019-02-27 ENCOUNTER — Other Ambulatory Visit: Payer: Self-pay | Admitting: Student

## 2019-02-27 ENCOUNTER — Encounter (HOSPITAL_COMMUNITY): Payer: Federal, State, Local not specified - PPO

## 2019-03-03 ENCOUNTER — Other Ambulatory Visit: Payer: Self-pay

## 2019-03-03 ENCOUNTER — Other Ambulatory Visit: Payer: Self-pay | Admitting: Oncology

## 2019-03-03 ENCOUNTER — Encounter (HOSPITAL_COMMUNITY): Payer: Self-pay

## 2019-03-03 ENCOUNTER — Ambulatory Visit (HOSPITAL_COMMUNITY)
Admission: RE | Admit: 2019-03-03 | Discharge: 2019-03-03 | Disposition: A | Discharge status: Home / Self Care | Payer: Federal, State, Local not specified - PPO | Source: Ambulatory Visit | Attending: Oncology | Admitting: Oncology

## 2019-03-03 DIAGNOSIS — R590 Localized enlarged lymph nodes: Secondary | ICD-10-CM | POA: Diagnosis not present

## 2019-03-03 DIAGNOSIS — F17210 Nicotine dependence, cigarettes, uncomplicated: Secondary | ICD-10-CM | POA: Diagnosis not present

## 2019-03-03 DIAGNOSIS — C801 Malignant (primary) neoplasm, unspecified: Secondary | ICD-10-CM

## 2019-03-03 DIAGNOSIS — M80021A Age-related osteoporosis with current pathological fracture, right humerus, initial encounter for fracture: Secondary | ICD-10-CM | POA: Diagnosis not present

## 2019-03-03 DIAGNOSIS — C7A1 Malignant poorly differentiated neuroendocrine tumors: Secondary | ICD-10-CM | POA: Insufficient documentation

## 2019-03-03 DIAGNOSIS — Z79899 Other long term (current) drug therapy: Secondary | ICD-10-CM | POA: Insufficient documentation

## 2019-03-03 HISTORY — DX: Malignant (primary) neoplasm, unspecified: C80.1

## 2019-03-03 LAB — CBC
HCT: 42.1 % (ref 36.0–46.0)
Hemoglobin: 14.2 g/dL (ref 12.0–15.0)
MCH: 32 pg (ref 26.0–34.0)
MCHC: 33.7 g/dL (ref 30.0–36.0)
MCV: 94.8 fL (ref 80.0–100.0)
Platelets: 250 10*3/uL (ref 150–400)
RBC: 4.44 MIL/uL (ref 3.87–5.11)
RDW: 13.2 % (ref 11.5–15.5)
WBC: 10.5 10*3/uL (ref 4.0–10.5)
nRBC: 0 % (ref 0.0–0.2)

## 2019-03-03 LAB — PROTIME-INR
INR: 1.1 (ref 0.8–1.2)
Prothrombin Time: 13.8 seconds (ref 11.4–15.2)

## 2019-03-03 LAB — APTT: aPTT: 31 seconds (ref 24–36)

## 2019-03-03 MED ORDER — MIDAZOLAM HCL 2 MG/2ML IJ SOLN
INTRAMUSCULAR | Status: AC | PRN
  Administered 2019-03-03 (×2): 1 mg via INTRAVENOUS

## 2019-03-03 MED ORDER — FENTANYL CITRATE (PF) 100 MCG/2ML IJ SOLN
INTRAMUSCULAR | Status: AC | PRN
  Administered 2019-03-03: 50 ug via INTRAVENOUS

## 2019-03-03 MED ORDER — MIDAZOLAM HCL 2 MG/2ML IJ SOLN
INTRAMUSCULAR | Status: AC
  Filled 2019-03-03: qty 2

## 2019-03-03 MED ORDER — FENTANYL CITRATE (PF) 100 MCG/2ML IJ SOLN
INTRAMUSCULAR | Status: AC
  Filled 2019-03-03: qty 2

## 2019-03-03 MED ORDER — LIDOCAINE-EPINEPHRINE (PF) 2 %-1:200000 IJ SOLN
INTRAMUSCULAR | Status: AC | PRN
  Administered 2019-03-03: 10 mL

## 2019-03-03 MED ORDER — LIDOCAINE-EPINEPHRINE (PF) 2 %-1:200000 IJ SOLN
INTRAMUSCULAR | Status: AC
  Filled 2019-03-03: qty 20

## 2019-03-03 MED ORDER — SODIUM CHLORIDE 0.9 % IV SOLN
INTRAVENOUS | Status: DC
  Administered 2019-03-03: 12:00:00 via INTRAVENOUS

## 2019-03-03 NOTE — Procedures (Signed)
Pre Procedure Dx: Cervical lymphadenopathy Post Procedural Dx: Same  Technically successful US guided biopsy of indeterminate left cervical lymph node.   EBL: None  No immediate complications.   Ronny Bacon, MD Pager #: [phone]

## 2019-03-03 NOTE — Consult Note (Addendum)
Chief Complaint: Patient was seen in consultation today for image guided liver lesion/porta hepatis adenopathy vs neck lymph node biopsy  Referring Physician(s): Magrinat,Gustav C  Supervising Physician: Sandi Mariscal  Patient Status: [facility] - Out-pt  History of Present Illness: Brittany Marks is a 78 y.o. female smoker with history of pathological left humeral fracture and recent PET scan revealing: 1. Hypermetabolic adenopathy within the low neck, chest, and abdomen. Concurrent hypermetabolic right upper lobe pulmonary nodules. Favor multifocal lymphoma. Metastatic small cell lung cancer could look similar. Process is overall progressive compared to 01/01/2019 chest CT. 2. Hypermetabolism corresponding to nonacute left proximal humerus fracture, favored to be pathologic. 3. Coronary artery atherosclerosis. Aortic Atherosclerosis (ICD10-I70.0). 4. Interstitial lung disease, as on prior CT. 5. 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  She has no prior history of cancer.  She presents today for image guided liver lesion/porta hepatis adenopathy vs neck lymph node biopsy for further evaluation.  Past Medical History:  Diagnosis Date   Cancer [facility])    Cataract    Bilateral   Constipation    pain medication   Left rotator cuff tear    Right rotator cuff tear     Past Surgical History:  Procedure Laterality Date   CATARACT EXTRACTION W/ INTRAOCULAR LENS  IMPLANT, BILATERAL  2012   DECOMPRESSIVE LUMBAR LAMINECTOMY LEVEL 2 N/A 07/29/2013   Procedure: LUMBAR LAMINECTOMY, DECOMPRESSION LUMBAR THREE TO FOUR, FOUR TO FIVE microdiscectomy l3,4 right;  Surgeon: Tobi Bastos, MD;  Location: [facility];  Service: Orthopedics;  Laterality: N/A;   I & D SUPERIOR RIGHT SHOULDER AND CLOSURE WOUND  01-10-2011   S/P ROTATOR CUFF REPAIR   LUMBAR LAMINECTOMY  1970'S   LUMBAR LAMINECTOMY/DECOMPRESSION MICRODISCECTOMY N/A  06/27/2016   Procedure: L1 - L2 DISCECTOMY;  Surgeon: Melina Schools, MD;  Location: [place];  Service: Orthopedics;  Laterality: N/A;   LUMBAR SPINE SURGERY  1983   RIGHT SHOULDER ARTHROSCOPY/ OPEN DISTAL CLAVICLE RESECTION/ SAD/ OPEN ROTATOR CUFF REPAIR  11-28-2010   SHOULDER OPEN ROTATOR CUFF REPAIR  12/19/2011   Procedure: ROTATOR CUFF REPAIR SHOULDER OPEN;  Surgeon: Magnus Sinning, MD;  Location: [place];  Service: Orthopedics;  Laterality: Right;  RIGHT RECURRENT OPEN REPAIR OF THE ROTATOR CUFF WITH TISSUE MEND [place]   VAGINAL HYSTERECTOMY  1979    Allergies: Patient has no known allergies.  Medications: Prior to Admission medications   Medication Sig Start Date End Date Taking? Authorizing Provider  b complex vitamins tablet Take 1 tablet by mouth daily.   Yes [provider]  COD LIVER OIL PO Take 1 tablet by mouth daily.    Yes [provider]  Multiple Vitamin (MULTIVITAMIN) tablet Take 1 tablet by mouth daily.   Yes [provider]  oxyCODONE-acetaminophen (PERCOCET) 10-325 MG tablet Take 1 tablet by mouth every 4 (four) hours as needed for pain. 06/27/16  Yes Melina Schools, MD  oxymetazoline (AFRIN) 0.05 % nasal spray Place 2 sprays into the nose 2 (two) times daily as needed for congestion.   Yes [provider]  Polyethyl Glycol-Propyl Glycol (SYSTANE) 0.4-0.3 % SOLN Apply 1 drop to eye 2 (two) times daily as needed (dry eyes).   Yes [provider]  sodium chloride (OCEAN) 0.65 % nasal spray Place 1 spray into the nose 2 (two) times daily as needed for congestion.   Yes [provider]  vitamin C (ASCORBIC ACID) 500 MG tablet Take 500  mg by mouth daily.   Yes [provider]  vitamin E 400 UNIT capsule Take 400 Units by mouth daily.   Yes [provider]  ondansetron (ZOFRAN) 4 MG tablet Take 1 tablet (4 mg total) by mouth every 8 (eight) hours as needed for nausea  or vomiting. Patient not taking: Reported on 12/26/2018 06/27/16   Melina Schools, MD     Family History  Problem Relation Age of Onset   Congestive Heart Failure Mother    Congestive Heart Failure Father     Social History   Socioeconomic History   Marital status: Divorced    Spouse name: Not on file   Number of children: Not on file   Years of education: Not on file   Highest education level: Not on file  Occupational History   Not on file  Social Needs   Financial resource strain: Not on file   Food insecurity:    Worry: Not on file    Inability: Not on file   Transportation needs:    Medical: Not on file    Non-medical: Not on file  Tobacco Use   Smoking status: Current Every Day Smoker    Packs/day: 1.00    Years: 40.00    Pack years: 40.00    Types: Cigarettes   Smokeless tobacco: Never Used   Tobacco comment: trying to quit now  Substance and Sexual Activity   Alcohol use: Yes    Comment: RARE   Drug use: No   Sexual activity: Not on file  Lifestyle   Physical activity:    Days per week: Not on file    Minutes per session: Not on file   Stress: Not on file  Relationships   Social connections:    Talks on phone: Not on file    Gets together: Not on file    Attends religious service: Not on file    Active member of club or organization: Not on file    Attends meetings of clubs or organizations: Not on file    Relationship status: Not on file  Other Topics Concern   Not on file  Social History Narrative   Not on file      Review of Systems denies fever, headache, chest pain, dyspnea, abdominal pain, nausea, vomiting or bleeding.  She does have occasional cough and chronic back pain  Vital Signs: BP 133/67    Pulse 90    Temp 97.9 F (36.6 C) (Oral)    Resp 18    SpO2 90%   Physical Exam awake, alert.  Chest with distant but clear breath sounds bilaterally.  Heart with regular rate and rhythm.  Abdomen soft, positive bowel  sounds, nontender.  No significant lower extremity edema.  Imaging: Nm Pet Image Initial (pi) Skull Base To Thigh  Result Date: 02/24/2019 CLINICAL DATA:  Initial treatment strategy for workup for lymphoma. Pathologic fracture. EXAM: NUCLEAR MEDICINE PET SKULL BASE TO THIGH TECHNIQUE: 7.3 mCi F-18 FDG was injected intravenously. Full-ring PET imaging was performed from the skull base to thigh after the radiotracer. CT data was obtained and used for attenuation correction and anatomic localization. Fasting blood glucose: 98 mg/dl COMPARISON:  01/01/2019 chest CT. Bone scan 01/01/2019. No prior abdominopelvic imaging. FINDINGS: Mediastinal blood pool activity: SUV max 2.6 Liver activity: SUV max 4.1 NECK: Right palatine tonsil hypermetabolism is favored to be physiologic. Bilateral hypermetabolic cervical nodes, including 8 mm right posterior triangle node on image 25/4. An index left low jugular/supraclavicular  node measures 1.0 cm and a S.U.V. max of 8.8 on image 45/4. Incidental CT findings: Dense carotid atherosclerosis bilaterally. CHEST: Precarinal node measures 2.1 cm and a S.U.V. max of 13.5 on image 62/4. Right hilar adenopathy at 2.3 cm and a S.U.V. max of 14.0 on image 72/4. Hypermetabolic right upper lobe pulmonary nodules, including a central nodule which measures 11 mm and a S.U.V. max of 9.1 on image 30/8. Incidental CT findings: Aortic and multivessel coronary artery atherosclerosis. Interstitial lung disease. ABDOMEN/PELVIS: Hypermetabolism corresponding to the porta hepatis mass. This measures on the order of 5.2 x 5.4 cm and a S.U.V. max of 16.7 on image 100/4. This is enlarged from on the order of 4.4 x 4.4 cm on the prior diagnostic CT. Retroperitoneal hypermetabolic adenopathy, including a 1.5 cm retrocaval node which measures a S.U.V. max of 12.8 on image 105/4. 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 Advanced  abdominal aortic and branch vessel atherosclerosis. Hysterectomy. Pelvic floor laxity. SKELETON: Left proximal humerus hypermetabolism, corresponding to nonacute fracture. This measures a S.U.V. max of 15.2. There is also degenerative hypermetabolism about the right glenohumeral joint. Incidental CT findings: none IMPRESSION: 1. Hypermetabolic adenopathy within the low neck, chest, and abdomen. Concurrent hypermetabolic right upper lobe pulmonary nodules. Favor multifocal lymphoma. Metastatic small cell lung cancer could look similar. Process is overall progressive compared to 01/01/2019 chest CT. 2. Hypermetabolism corresponding to nonacute left proximal humerus fracture, favored to be pathologic. 3. Coronary artery atherosclerosis. Aortic Atherosclerosis (ICD10-I70.0). 4. Interstitial lung disease, as on prior CT. 5. 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 Electronically Signed   By: Abigail Miyamoto M.D.   On: 02/24/2019 17:02    Labs:  CBC: Recent Labs    12/26/18 1523  WBC 13.2*  HGB 13.1  HCT 38.9  PLT 264    COAGS: No results for input(s): INR, APTT in the last 8760 hours.  BMP: Recent Labs    12/26/18 1523  NA 132*  K 3.6  CL 98  CO2 22  GLUCOSE 101*  BUN 14  CALCIUM 8.5*  CREATININE 0.76  GFRNONAA >60  GFRAA >60    LIVER FUNCTION TESTS: Recent Labs    12/26/18 1523  BILITOT 0.5  AST 25  ALT 19  ALKPHOS 71  PROT 7.2  ALBUMIN 3.8    TUMOR MARKERS: No results for input(s): AFPTM, CEA, CA199, CHROMGRNA in the last 8760 hours.  Assessment and Plan:  78 y.o. female smoker with history of pathological left humeral fracture and recent PET scan revealing: 1. Hypermetabolic adenopathy within the low neck, chest, and abdomen. Concurrent hypermetabolic right upper lobe pulmonary nodules. Favor multifocal lymphoma. Metastatic small cell lung cancer could look similar. Process is overall progressive compared to  01/01/2019 chest CT. 2. Hypermetabolism corresponding to nonacute left proximal humerus fracture, favored to be pathologic. 3. Coronary artery atherosclerosis. Aortic Atherosclerosis (ICD10-I70.0). 4. Interstitial lung disease, as on prior CT. 5. 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  She has no prior history of cancer.  She presents today for image guided liver lesion/porta hepatis adenopathy vs neck lymph node biopsy for further evaluation.Risks and benefits of procedure was discussed with the patient  including, but not limited to bleeding, infection, damage to adjacent structures or low yield requiring additional tests.  All of the questions were answered and there is agreement to proceed.  Consent signed and in chart.  Thank you for this interesting consult.  I greatly enjoyed meeting ADELLYN CAPEK and look forward to participating in their care.  A copy of this report was sent to the requesting provider on this date.  Electronically Signed: D. Rowe Robert, PA-C 03/03/2019, 12:10 PM   I spent a total of  25 minutes   in face to face in clinical consultation, greater than 50% of which was counseling/coordinating care for image guided liver lesion/porta hepatis adenopathy vs neck lymph node biopsy

## 2019-03-03 NOTE — Discharge Instructions (Signed)
Needle Biopsy, Care After °These instructions tell you how to care for yourself after your procedure. Your doctor may also give you more specific instructions. Call your doctor if you have any problems or questions. °What can I expect after the procedure? °After the procedure, it is common to have: °· Soreness. °· Bruising. °· Mild pain. °Follow these instructions at home: ° °· Return to your normal activities as told by your doctor. Ask your doctor what activities are safe for you. °· Take over-the-counter and prescription medicines only as told by your doctor. °· Wash your hands with soap and water before you change your bandage (dressing). If you cannot use soap and water, use hand sanitizer. °· Follow instructions from your doctor about: °? How to take care of your puncture site. °? When and how to change your bandage. °? When to remove your bandage. °· Check your puncture site every day for signs of infection. Watch for: °? Redness, swelling, or pain. °? Fluid or blood.  °? Pus or a bad smell. °? Warmth. °· Do not take baths, swim, or use a hot tub until your doctor approves. Ask your doctor if you may take showers. You may only be allowed to take sponge baths. °· Keep all follow-up visits as told by your doctor. This is important. °Contact a doctor if you have: °· A fever. °· Redness, swelling, or pain at the puncture site, and it lasts longer than a few days. °· Fluid, blood, or pus coming from the puncture site. °· Warmth coming from the puncture site. °Get help right away if: °· You have a lot of bleeding from the puncture site. °Summary °· After the procedure, it is common to have soreness, bruising, or mild pain at the puncture site. °· Check your puncture site every day for signs of infection, such as redness, swelling, or pain. °· Get help right away if you have severe bleeding from your puncture site. °This information is not intended to replace advice given to you by your health care provider. Make  sure you discuss any questions you have with your health care provider. °Document Released: 09/06/2008 Document Revised: 10/07/2017 Document Reviewed: 10/07/2017 °Elsevier Interactive Patient Education © 2019 [employer] °Moderate Conscious Sedation, Adult, Care After °These instructions provide you with information about caring for yourself after your procedure. Your health care provider may also give you more specific instructions. Your treatment has been planned according to current medical practices, but problems sometimes occur. Call your health care provider if you have any problems or questions after your procedure. °What can I expect after the procedure? °After your procedure, it is common: °· To feel sleepy for several hours. °· To feel clumsy and have poor balance for several hours. °· To have poor judgment for several hours. °· To vomit if you eat too soon. °Follow these instructions at home: °For at least 24 hours after the procedure: ° °· Do not: °? Participate in activities where you could fall or become injured. °? Drive. °? Use heavy machinery. °? Drink alcohol. °? Take sleeping pills or medicines that cause drowsiness. °? Make important decisions or sign legal documents. °? Take care of children on your own. °· Rest. °Eating and drinking °· Follow the diet recommended by your health care provider. °· If you vomit: °? Drink water, juice, or soup when you can drink without vomiting. °? Make sure you have little or no nausea before eating solid foods. °General instructions °· Have a responsible adult stay   with you until you are awake and alert. °· Take over-the-counter and prescription medicines only as told by your health care provider. °· If you smoke, do not smoke without supervision. °· Keep all follow-up visits as told by your health care provider. This is important. °Contact a health care provider if: °· You keep feeling nauseous or you keep vomiting. °· You feel light-headed. °· You develop a  rash. °· You have a fever. °Get help right away if: °· You have trouble breathing. °This information is not intended to replace advice given to you by your health care provider. Make sure you discuss any questions you have with your health care provider. °Document Released: 07/15/2013 Document Revised: 02/27/2016 Document Reviewed: 01/14/2016 °Elsevier Interactive Patient Education © 2019 [employer] ° °

## 2019-03-04 ENCOUNTER — Inpatient Hospital Stay (HOSPITAL_BASED_OUTPATIENT_CLINIC_OR_DEPARTMENT_OTHER): Payer: Medicare Other | Admitting: Oncology

## 2019-03-04 ENCOUNTER — Inpatient Hospital Stay: Payer: Medicare Other

## 2019-03-04 DIAGNOSIS — C8299 Follicular lymphoma, unspecified, extranodal and solid organ sites: Secondary | ICD-10-CM

## 2019-03-04 DIAGNOSIS — C801 Malignant (primary) neoplasm, unspecified: Secondary | ICD-10-CM

## 2019-03-04 DIAGNOSIS — M80021A Age-related osteoporosis with current pathological fracture, right humerus, initial encounter for fracture: Secondary | ICD-10-CM

## 2019-03-05 ENCOUNTER — Other Ambulatory Visit: Payer: Self-pay | Admitting: Oncology

## 2019-03-09 ENCOUNTER — Telehealth: Payer: Self-pay | Admitting: *Deleted

## 2019-03-09 ENCOUNTER — Telehealth: Payer: Self-pay | Admitting: Oncology

## 2019-03-09 NOTE — Telephone Encounter (Signed)
Cancelled 6/5 appts per sch msg.called and spoke with patients daughter. Confirmed cancellation of appt.

## 2019-03-09 NOTE — Telephone Encounter (Signed)
Oncology Nurse Navigator Documentation  Oncology Nurse Navigator Flowsheets 03/09/2019  Navigator Location [facility]  Referral date to RadOnc/MedOnc 03/09/2019  Navigator Encounter Type Telephone/I received a message from scheduling.  Patient's daughter would like someone to call her about patient appt with Dr. Julien Nordmann. I called and was unable to reach.  I did leave a vm message to call with my name and phone number.   Telephone Outgoing Call  Treatment Phase Pre-Tx/Tx Discussion  Barriers/Navigation Needs Education  Education Other  Interventions Education  Education Method Verbal  Acuity Level 2  Time Spent with Patient 15

## 2019-03-10 ENCOUNTER — Encounter: Payer: Self-pay | Admitting: Internal Medicine

## 2019-03-10 ENCOUNTER — Other Ambulatory Visit: Payer: Self-pay

## 2019-03-10 ENCOUNTER — Inpatient Hospital Stay: Payer: Federal, State, Local not specified - PPO | Attending: Oncology | Admitting: Internal Medicine

## 2019-03-10 VITALS — BP 152/92 | HR 91 | Temp 97.9°F | Resp 20 | Ht 62.0 in | Wt 131.3 lb

## 2019-03-10 DIAGNOSIS — R590 Localized enlarged lymph nodes: Secondary | ICD-10-CM

## 2019-03-10 DIAGNOSIS — Z5111 Encounter for antineoplastic chemotherapy: Secondary | ICD-10-CM | POA: Diagnosis not present

## 2019-03-10 DIAGNOSIS — C3411 Malignant neoplasm of upper lobe, right bronchus or lung: Secondary | ICD-10-CM

## 2019-03-10 DIAGNOSIS — M545 Low back pain: Secondary | ICD-10-CM | POA: Insufficient documentation

## 2019-03-10 DIAGNOSIS — Z7189 Other specified counseling: Secondary | ICD-10-CM

## 2019-03-10 DIAGNOSIS — I10 Essential (primary) hypertension: Secondary | ICD-10-CM

## 2019-03-10 DIAGNOSIS — C7951 Secondary malignant neoplasm of bone: Secondary | ICD-10-CM

## 2019-03-10 DIAGNOSIS — F17210 Nicotine dependence, cigarettes, uncomplicated: Secondary | ICD-10-CM | POA: Insufficient documentation

## 2019-03-10 DIAGNOSIS — J849 Interstitial pulmonary disease, unspecified: Secondary | ICD-10-CM | POA: Insufficient documentation

## 2019-03-10 DIAGNOSIS — M84422D Pathological fracture, left humerus, subsequent encounter for fracture with routine healing: Secondary | ICD-10-CM | POA: Diagnosis not present

## 2019-03-10 DIAGNOSIS — I6523 Occlusion and stenosis of bilateral carotid arteries: Secondary | ICD-10-CM

## 2019-03-10 DIAGNOSIS — Z79899 Other long term (current) drug therapy: Secondary | ICD-10-CM | POA: Insufficient documentation

## 2019-03-10 DIAGNOSIS — Z5112 Encounter for antineoplastic immunotherapy: Secondary | ICD-10-CM

## 2019-03-10 DIAGNOSIS — I2510 Atherosclerotic heart disease of native coronary artery without angina pectoris: Secondary | ICD-10-CM

## 2019-03-10 DIAGNOSIS — Z7689 Persons encountering health services in other specified circumstances: Secondary | ICD-10-CM | POA: Insufficient documentation

## 2019-03-10 DIAGNOSIS — K5900 Constipation, unspecified: Secondary | ICD-10-CM | POA: Insufficient documentation

## 2019-03-10 DIAGNOSIS — R040 Epistaxis: Secondary | ICD-10-CM | POA: Insufficient documentation

## 2019-03-10 MED ORDER — NICOTINE 21 MG/24HR TD PT24: 21.0000 mg | MEDICATED_PATCH | Freq: Every day | TRANSDERMAL | 0 refills | Status: DC

## 2019-03-10 MED ORDER — PROCHLORPERAZINE MALEATE 10 MG PO TABS: 10.0000 mg | ORAL_TABLET | Freq: Four times a day (QID) | ORAL | 0 refills | Status: DC | PRN

## 2019-03-10 NOTE — Progress Notes (Signed)
[facility] [phone]   [phone]  CONSULT NOTE  REFERRING PHYSICIAN: Dr. Justice Britain  REASON FOR CONSULTATION:  78 years old white female recently diagnosed with lung cancer.  HPI Brittany Marks is a 78 y.o. female with past medical history significant for left and right rotator cuff tear as well as bilateral cataract and long history of smoking.  The patient was seen by Dr. Onnie Graham in February 2020 complaining of pain in the left arm and x-ray showed suspicious pathologic fracture in the proximal left humerus.  The patient was referred to Dr. Jana Hakim and chest x-ray was performed on December 26, 2027 which showed focal opacity in the right midlung zone with left lower atelectasis and questionable pathologic fracture of the left proximal humerus.  CT scan of the chest with contrast was performed on January 01, 2019 and that showed mediastinal lymphadenopathy in the right paratracheal region with the largest lymph node measuring 2.6 cm.  There was also right hilar lymphadenopathy with the largest lymph node measuring 3.2 cm and left hilar adenopathy measuring 1.2 cm.  There was also a soft tissue mass within the porta hepatis measuring 0.4 x 4.8 cm likely representing lymphadenopathy.  A retroperitoneal lymph node was seen in the left para-aortic region measuring 1.6 cm.  This scan also showed fracture of the left humeral neck which may be pathologic.  The patient had a PET scan on Feb 24, 2019 and it showed hypermetabolic adenopathy within the lower neck, chest and abdomen.  There was also hypermetabolic right upper lobe pulmonary nodules.  The finding has a differential diagnosis include lymphoma versus metastatic small cell lung cancer.  And the process was progressive compared to the January 01, 2019 CT scan.  There was also hypermetabolism corresponding to the nonacute left proximal humerus fracture favored to be pathologic.  On Mar 03, 2019 the patient  underwent ultrasound-guided left cervical lymph node biopsy by interventional radiology. The final pathology [id number]) showed metastatic high-grade neuroendocrine carcinoma. The leading differential diagnosis is small cell carcinoma. The malignant cells are strongly positive for CD56 with dot-like staining for cytokeratin AE1/AE3. There is focal nonspecific staining with TTF-1. The malignant cells are negative for CD45, cytokeratin 5/6, chromogranin and p63. 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 The patient was referred to me today for evaluation and recommendation regarding treatment of her condition.  When seen today she is feeling fine with no concerning complaints except for intermittent pain in the left arm in addition to low back pain.  She denied having any chest pain or shortness of breath but continues to have smoking cough with no hemoptysis.  She denied having any recent weight loss or night sweats.  She has no nausea, vomiting, diarrhea or constipation.  She has no headache or visual changes. Family history significant for mother and father with congestive heart failure. The patient is single and has 1 daughter, Brittany Marks who was available by video chat during the visit.  The patient has a history for smoking around 1 pack/day for 61 years and unfortunately she continues to smoke.  She drinks alcohol occasionally and no history of drug abuse.  HPI  Past Medical History:  Diagnosis Date   Cancer [facility])    Cataract    Bilateral   Constipation    pain medication   Left rotator cuff tear    Right rotator cuff tear     Past Surgical History:  Procedure Laterality Date  CATARACT EXTRACTION W/ INTRAOCULAR LENS  IMPLANT, BILATERAL  2012   DECOMPRESSIVE LUMBAR LAMINECTOMY LEVEL 2 N/A 07/29/2013   Procedure: LUMBAR LAMINECTOMY, DECOMPRESSION LUMBAR THREE TO FOUR, FOUR TO FIVE microdiscectomy l3,4 right;  Surgeon: Tobi Bastos, MD;  Location:  [facility];  Service: Orthopedics;  Laterality: N/A;   I & D SUPERIOR RIGHT SHOULDER AND CLOSURE WOUND  01-10-2011   S/P ROTATOR CUFF REPAIR   LUMBAR LAMINECTOMY  1970'S   LUMBAR LAMINECTOMY/DECOMPRESSION MICRODISCECTOMY N/A 06/27/2016   Procedure: L1 - L2 DISCECTOMY;  Surgeon: Melina Schools, MD;  Location: [place];  Service: Orthopedics;  Laterality: N/A;   LUMBAR SPINE SURGERY  1983   RIGHT SHOULDER ARTHROSCOPY/ OPEN DISTAL CLAVICLE RESECTION/ SAD/ OPEN ROTATOR CUFF REPAIR  11-28-2010   SHOULDER OPEN ROTATOR CUFF REPAIR  12/19/2011   Procedure: ROTATOR CUFF REPAIR SHOULDER OPEN;  Surgeon: Magnus Sinning, MD;  Location: [place];  Service: Orthopedics;  Laterality: Right;  RIGHT RECURRENT OPEN REPAIR OF THE ROTATOR CUFF WITH TISSUE MEND GRAFTANTERIOR CHROMIOECTOMY   VAGINAL HYSTERECTOMY  1979    Family History  Problem Relation Age of Onset   Congestive Heart Failure Mother    Congestive Heart Failure Father     Social History Social History   Tobacco Use   Smoking status: Current Every Day Smoker    Packs/day: 1.00    Years: 40.00    Pack years: 40.00    Types: Cigarettes   Smokeless tobacco: Never Used   Tobacco comment: trying to quit now  Substance Use Topics   Alcohol use: Yes    Comment: RARE   Drug use: No    No Known Allergies  Current Outpatient Medications  Medication Sig Dispense Refill   b complex vitamins tablet Take 1 tablet by mouth daily.     COD LIVER OIL PO Take 1 tablet by mouth daily.      Multiple Vitamin (MULTIVITAMIN) tablet Take 1 tablet by mouth daily.     ondansetron (ZOFRAN) 4 MG tablet Take 1 tablet (4 mg total) by mouth every 8 (eight) hours as needed for nausea or vomiting. (Patient not taking: Reported on 12/26/2018) 20 tablet 0   oxyCODONE-acetaminophen (PERCOCET) 10-325 MG tablet Take 1 tablet by mouth every 4 (four) hours as needed for pain. 60 tablet 0   oxymetazoline (AFRIN) 0.05 % nasal spray Place 2  sprays into the nose 2 (two) times daily as needed for congestion.     Polyethyl Glycol-Propyl Glycol (SYSTANE) 0.4-0.3 % SOLN Apply 1 drop to eye 2 (two) times daily as needed (dry eyes).     sodium chloride (OCEAN) 0.65 % nasal spray Place 1 spray into the nose 2 (two) times daily as needed for congestion.     vitamin C (ASCORBIC ACID) 500 MG tablet Take 500 mg by mouth daily.     vitamin E 400 UNIT capsule Take 400 Units by mouth daily.     No current facility-administered medications for this visit.     Review of Systems  Constitutional: positive for fatigue Eyes: negative Ears, nose, mouth, throat, and face: positive for epistaxis Respiratory: positive for cough Cardiovascular: negative Gastrointestinal: negative Genitourinary:negative Integument/breast: negative Hematologic/lymphatic: negative Musculoskeletal:negative Neurological: negative Behavioral/Psych: negative Endocrine: negative Allergic/Immunologic: negative  Physical Exam  QJJ:HERDE, healthy, no distress, well nourished, well developed and anxious SKIN: skin color, texture, turgor are normal, no rashes or significant lesions HEAD: Normocephalic, No masses, lesions, tenderness or abnormalities EYES: normal, PERRLA, Conjunctiva are pink and non-injected EARS:  External ears normal, Canals clear OROPHARYNX:no exudate, no erythema and lips, buccal mucosa, and tongue normal  NECK: supple, no adenopathy, no JVD LYMPH:  no palpable lymphadenopathy, no hepatosplenomegaly BREAST:not examined LUNGS: clear to auscultation , and palpation HEART: regular rate & rhythm, no murmurs and no gallops ABDOMEN:abdomen soft, non-tender, normal bowel sounds and no masses or organomegaly BACK: No CVA tenderness, Range of motion is normal EXTREMITIES:no joint deformities, effusion, or inflammation, no edema  NEURO: alert & oriented x 3 with fluent speech, no focal motor/sensory deficits  PERFORMANCE STATUS: ECOG 1  LABORATORY  DATA: Lab Results  Component Value Date   WBC 10.5 03/03/2019   HGB 14.2 03/03/2019   HCT 42.1 03/03/2019   MCV 94.8 03/03/2019   PLT 250 03/03/2019      Chemistry      Component Value Date/Time   NA 132 (L) 12/26/2018 1523   K 3.6 12/26/2018 1523   CL 98 12/26/2018 1523   CO2 22 12/26/2018 1523   BUN 14 12/26/2018 1523   CREATININE 0.76 12/26/2018 1523      Component Value Date/Time   CALCIUM 8.5 (L) 12/26/2018 1523   ALKPHOS 71 12/26/2018 1523   AST 25 12/26/2018 1523   ALT 19 12/26/2018 1523   BILITOT 0.5 12/26/2018 1523       RADIOGRAPHIC STUDIES: Nm Pet Image Initial (pi) Skull Base To Thigh  Result Date: 02/24/2019 CLINICAL DATA:  Initial treatment strategy for workup for lymphoma. Pathologic fracture. EXAM: NUCLEAR MEDICINE PET SKULL BASE TO THIGH TECHNIQUE: 7.3 mCi F-18 FDG was injected intravenously. Full-ring PET imaging was performed from the skull base to thigh after the radiotracer. CT data was obtained and used for attenuation correction and anatomic localization. Fasting blood glucose: 98 mg/dl COMPARISON:  01/01/2019 chest CT. Bone scan 01/01/2019. No prior abdominopelvic imaging. FINDINGS: Mediastinal blood pool activity: SUV max 2.6 Liver activity: SUV max 4.1 NECK: Right palatine tonsil hypermetabolism is favored to be physiologic. Bilateral hypermetabolic cervical nodes, including 8 mm right posterior triangle node on image 25/4. An index left low jugular/supraclavicular node measures 1.0 cm and a S.U.V. max of 8.8 on image 45/4. Incidental CT findings: Dense carotid atherosclerosis bilaterally. CHEST: Precarinal node measures 2.1 cm and a S.U.V. max of 13.5 on image 62/4. Right hilar adenopathy at 2.3 cm and a S.U.V. max of 14.0 on image 72/4. Hypermetabolic right upper lobe pulmonary nodules, including a central nodule which measures 11 mm and a S.U.V. max of 9.1 on image 30/8. Incidental CT findings: Aortic and multivessel coronary artery atherosclerosis.  Interstitial lung disease. ABDOMEN/PELVIS: Hypermetabolism corresponding to the porta hepatis mass. This measures on the order of 5.2 x 5.4 cm and a S.U.V. max of 16.7 on image 100/4. This is enlarged from on the order of 4.4 x 4.4 cm on the prior diagnostic CT. Retroperitoneal hypermetabolic adenopathy, including a 1.5 cm retrocaval node which measures a S.U.V. max of 12.8 on image 105/4. 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 Advanced abdominal aortic and branch vessel atherosclerosis. Hysterectomy. Pelvic floor laxity. SKELETON: Left proximal humerus hypermetabolism, corresponding to nonacute fracture. This measures a S.U.V. max of 15.2. There is also degenerative hypermetabolism about the right glenohumeral joint. Incidental CT findings: none IMPRESSION: 1. Hypermetabolic adenopathy within the low neck, chest, and abdomen. Concurrent hypermetabolic right upper lobe pulmonary nodules. Favor multifocal lymphoma. Metastatic small cell lung cancer could look similar. Process is overall progressive compared to 01/01/2019 chest CT. 2. Hypermetabolism corresponding  to nonacute left proximal humerus fracture, favored to be pathologic. 3. Coronary artery atherosclerosis. Aortic Atherosclerosis (ICD10-I70.0). 4. Interstitial lung disease, as on prior CT. 5. 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 Electronically Signed   By: Abigail Miyamoto M.D.   On: 02/24/2019 17:02   [place] Core Biopsy (lymph Nodes)  Result Date: 03/03/2019 INDICATION: No known primary, now with hypermetabolic cervical, mediastinal and retroperitoneal lymphadenopathy. Please from ultrasound-guided biopsy of dominant left cervical lymph node for tissue diagnostic purposes. EXAM: ULTRASOUND-GUIDED LEFT CERVICAL LYMPH NODE BIOPSY COMPARISON:  PET-CT-02/24/2019 MEDICATIONS: None ANESTHESIA/SEDATION: Moderate (conscious)  sedation was employed during this procedure. A total of Versed 2 mg and Fentanyl 100 mcg was administered intravenously. Moderate Sedation Time: 14 minutes. The patient's level of consciousness and vital signs were monitored continuously by radiology nursing throughout the procedure under my direct supervision. COMPLICATIONS: None immediate. TECHNIQUE: Informed written consent was obtained from the patient after a discussion of the risks, benefits and alternatives to treatment. Questions regarding the procedure were encouraged and answered. Initial ultrasound scanning demonstrated an approximately 1.2 x 1.0 cm hypoechoic left cervical lymph node correlating with the hypermetabolic cervical lymph node seen on preceding PET-CT image 44, series 605. An ultrasound image was saved for documentation purposes (image 9). The procedure was planned. A timeout was performed prior to the initiation of the procedure. The operative was prepped and draped in the usual sterile fashion, and a sterile drape was applied covering the operative field. A timeout was performed prior to the initiation of the procedure. Local anesthesia was provided with 1% lidocaine with epinephrine. Under direct ultrasound guidance, an 18 gauge core needle device was utilized to obtain to obtain 6 core needle biopsies of the dominant hypermetabolic left cervical lymph node. The samples were placed in saline and submitted to pathology. The needle was removed and hemostasis was achieved with manual compression. Post procedure scan was negative for significant hematoma. A dressing was placed. The patient tolerated the procedure well without immediate postprocedural complication. IMPRESSION: Technically successful ultrasound guided biopsy of dominant hypermetabolic left cervical lymph node. Electronically Signed   By: Sandi Mariscal M.D.   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   PLAN: I had a lengthy discussion with the patient and her daughter today about her current disease stage, prognosis and treatment options. I explained to the patient her prognosis with and without treatment.  I explained to the patient that she has incurable condition and oriented treatment would be of palliative nature. I recommended for the patient to complete the staging work-up by ordering MRI of the brain to rule out brain metastasis. I will refer the patient to radiation oncology for consideration of palliative radiotherapy to the metastatic disease of the left humerus. I discussed with the patient her treatment options and gave her the option of palliative care and hospice referral versus consideration of palliative systemic chemotherapy with carboplatin for AUC of 5 on day 1, etoposide 100 mg/M2 on days 1, 2 and 3 as well as Imfinzi 1500 mg every 3 weeks with the chemotherapy and Neulasta support. The patient is interested in treatment and I discussed  with her the adverse effect of this treatment including but not limited to alopecia, myelosuppression, nausea and vomiting, peripheral neuropathy, liver or renal dysfunction. I will arrange for the patient to have a chemotherapy education class before starting the first dose of her treatment next week. I will call her pharmacy with prescription for Compazine 10 mg p.o. every 6 hours as needed for nausea. For smoking cessation I will start the patient on NicoDerm. I will see the patient back for follow-up visit in 2 weeks for evaluation and management of any adverse effect of her treatment. For hypertension, she is not currently on any blood pressure medication.  I  recommended for the patient to monitor her blood pressure closely at home and to reconsult with her primary care physician for medications if needed. The patient and her daughter were in agreement with the current plan. She was advised to call immediately if she has any concerning symptoms in the interval. The patient voices understanding of current disease status and treatment options and is in agreement with the current care plan.  All questions were answered. The patient knows to call the clinic with any problems, questions or concerns. We can certainly see the patient much sooner if necessary.  Thank you so much for allowing me to participate in the care of Brittany Marks. I will continue to follow up the patient with you and assist in her care.  I spent 55 minutes counseling the patient face to face. The total time spent in the appointment was 80 minutes.  Disclaimer: This note was dictated with voice recognition software. Similar sounding words can inadvertently be transcribed and may not be corrected upon review.   Eilleen Kempf March 10, 2019, 2:47 PM

## 2019-03-10 NOTE — Progress Notes (Signed)
START ON PATHWAY REGIMEN - Small Cell Lung     Cycles 1 through 4, every 21 days:     Atezolizumab      Carboplatin      Etoposide    Cycles 5 and beyond, every 21 days:     Atezolizumab   **Always confirm dose/schedule in your pharmacy ordering system**  Patient Characteristics: Newly Diagnosed, Preoperative or Nonsurgical Candidate (Clinical Staging), First Line, Extensive Stage Therapeutic Status: Newly Diagnosed, Preoperative or Nonsurgical Candidate (Clinical Staging) AJCC T Category: cT1c AJCC N Category: cN3 AJCC M Category: pM1c AJCC 8 Stage Grouping: IVB Stage Classification: Extensive Intent of Therapy: Non-Curative / Palliative Intent, Discussed with Patient

## 2019-03-11 ENCOUNTER — Telehealth: Payer: Self-pay | Admitting: Radiation Oncology

## 2019-03-11 NOTE — Telephone Encounter (Signed)
New message:    LVM for patient to return call to schedule appt from referral received

## 2019-03-12 ENCOUNTER — Other Ambulatory Visit: Payer: Self-pay | Admitting: Internal Medicine

## 2019-03-12 ENCOUNTER — Telehealth: Payer: Self-pay | Admitting: *Deleted

## 2019-03-12 ENCOUNTER — Other Ambulatory Visit: Payer: Self-pay | Admitting: Oncology

## 2019-03-12 ENCOUNTER — Telehealth: Payer: Self-pay | Admitting: Internal Medicine

## 2019-03-12 NOTE — Telephone Encounter (Signed)
Scheduled appt per 6/2 los - pt daughter is aware of appt date and times ,

## 2019-03-12 NOTE — Telephone Encounter (Signed)
"  Brittany Marks ([phone]) neighbor Kathryn Martinique calling for her to speak with a nurse."  "My nose bled when I was there Tuesday.  It's bleeding again today, pouring for the last 15 minutes.  Never been prone to nose bleeds.  What can I take to stop this from happening again.  Blood just dripping now.  No blood thinners but take an Ibuprofen every morning.  No seasonal allergies, congestion or nose blowing. Keep temperature in home at 75 degrees."  Reviewed nasal hygiene and care applying pressure to nose ten to 15 minutes with any nose bleeds.  If bleeding continues beyond this time, report to ED.   "Nose has stopped bleeding but please put a note in my records.  My nose had slowed when we called and has stopped now."

## 2019-03-12 NOTE — Telephone Encounter (Signed)
Contacted pt to verify telephone visit for pre reg

## 2019-03-13 ENCOUNTER — Telehealth: Payer: Self-pay | Admitting: *Deleted

## 2019-03-13 ENCOUNTER — Other Ambulatory Visit: Payer: Federal, State, Local not specified - PPO

## 2019-03-13 ENCOUNTER — Ambulatory Visit: Payer: Federal, State, Local not specified - PPO | Admitting: Oncology

## 2019-03-13 ENCOUNTER — Inpatient Hospital Stay: Payer: Federal, State, Local not specified - PPO

## 2019-03-13 NOTE — Telephone Encounter (Signed)
FYI

## 2019-03-16 ENCOUNTER — Other Ambulatory Visit: Payer: Self-pay | Admitting: Internal Medicine

## 2019-03-16 ENCOUNTER — Inpatient Hospital Stay (HOSPITAL_BASED_OUTPATIENT_CLINIC_OR_DEPARTMENT_OTHER): Payer: Federal, State, Local not specified - PPO | Admitting: Medical

## 2019-03-16 ENCOUNTER — Inpatient Hospital Stay: Payer: Federal, State, Local not specified - PPO

## 2019-03-16 ENCOUNTER — Other Ambulatory Visit: Payer: Self-pay

## 2019-03-16 ENCOUNTER — Ambulatory Visit (HOSPITAL_COMMUNITY): Payer: Federal, State, Local not specified - PPO

## 2019-03-16 VITALS — BP 154/89 | HR 83 | Temp 98.7°F | Resp 20 | Ht 62.0 in | Wt 132.5 lb

## 2019-03-16 DIAGNOSIS — F17210 Nicotine dependence, cigarettes, uncomplicated: Secondary | ICD-10-CM

## 2019-03-16 DIAGNOSIS — C3411 Malignant neoplasm of upper lobe, right bronchus or lung: Secondary | ICD-10-CM

## 2019-03-16 DIAGNOSIS — R590 Localized enlarged lymph nodes: Secondary | ICD-10-CM

## 2019-03-16 DIAGNOSIS — I6523 Occlusion and stenosis of bilateral carotid arteries: Secondary | ICD-10-CM

## 2019-03-16 DIAGNOSIS — Z79899 Other long term (current) drug therapy: Secondary | ICD-10-CM | POA: Diagnosis not present

## 2019-03-16 DIAGNOSIS — M84422D Pathological fracture, left humerus, subsequent encounter for fracture with routine healing: Secondary | ICD-10-CM

## 2019-03-16 DIAGNOSIS — C7951 Secondary malignant neoplasm of bone: Secondary | ICD-10-CM

## 2019-03-16 DIAGNOSIS — M545 Low back pain: Secondary | ICD-10-CM

## 2019-03-16 DIAGNOSIS — R040 Epistaxis: Secondary | ICD-10-CM | POA: Diagnosis not present

## 2019-03-16 DIAGNOSIS — Z5111 Encounter for antineoplastic chemotherapy: Secondary | ICD-10-CM | POA: Diagnosis not present

## 2019-03-16 DIAGNOSIS — J849 Interstitial pulmonary disease, unspecified: Secondary | ICD-10-CM | POA: Diagnosis not present

## 2019-03-16 DIAGNOSIS — Z7689 Persons encountering health services in other specified circumstances: Secondary | ICD-10-CM | POA: Diagnosis not present

## 2019-03-16 DIAGNOSIS — I10 Essential (primary) hypertension: Secondary | ICD-10-CM | POA: Diagnosis not present

## 2019-03-16 DIAGNOSIS — K5900 Constipation, unspecified: Secondary | ICD-10-CM

## 2019-03-16 DIAGNOSIS — I2510 Atherosclerotic heart disease of native coronary artery without angina pectoris: Secondary | ICD-10-CM | POA: Diagnosis not present

## 2019-03-16 LAB — CBC WITH DIFFERENTIAL (CANCER CENTER ONLY)
Abs Immature Granulocytes: 0.03 10*3/uL (ref 0.00–0.07)
Basophils Absolute: 0.1 10*3/uL (ref 0.0–0.1)
Basophils Relative: 1 %
Eosinophils Absolute: 0.2 10*3/uL (ref 0.0–0.5)
Eosinophils Relative: 2 %
HCT: 37.6 % (ref 36.0–46.0)
Hemoglobin: 12.9 g/dL (ref 12.0–15.0)
Immature Granulocytes: 0 %
Lymphocytes Relative: 20 %
Lymphs Abs: 1.8 10*3/uL (ref 0.7–4.0)
MCH: 30.9 pg (ref 26.0–34.0)
MCHC: 34.3 g/dL (ref 30.0–36.0)
MCV: 90.2 fL (ref 80.0–100.0)
Monocytes Absolute: 0.8 10*3/uL (ref 0.1–1.0)
Monocytes Relative: 9 %
Neutro Abs: 6.1 10*3/uL (ref 1.7–7.7)
Neutrophils Relative %: 68 %
Platelet Count: 230 10*3/uL (ref 150–400)
RBC: 4.17 MIL/uL (ref 3.87–5.11)
RDW: 12.9 % (ref 11.5–15.5)
WBC Count: 9 10*3/uL (ref 4.0–10.5)
nRBC: 0 % (ref 0.0–0.2)

## 2019-03-16 LAB — CMP (CANCER CENTER ONLY)
ALT: 13 U/L (ref 0–44)
AST: 23 U/L (ref 15–41)
Albumin: 3.5 g/dL (ref 3.5–5.0)
Alkaline Phosphatase: 83 U/L (ref 38–126)
Anion gap: 11 (ref 5–15)
BUN: 18 mg/dL (ref 8–23)
CO2: 22 mmol/L (ref 22–32)
Calcium: 9.6 mg/dL (ref 8.9–10.3)
Chloride: 105 mmol/L (ref 98–111)
Creatinine: 0.79 mg/dL (ref 0.44–1.00)
GFR, Est AFR Am: >60 mL/min (ref >60)
GFR, Estimated: >60 mL/min (ref >60)
Glucose, Bld: 131 mg/dL — ABNORMAL HIGH (ref 70–99)
Potassium: 3.7 mmol/L (ref 3.5–5.1)
Sodium: 138 mmol/L (ref 135–145)
Total Bilirubin: 0.3 mg/dL (ref 0.3–1.2)
Total Protein: 7.4 g/dL (ref 6.5–8.1)

## 2019-03-16 LAB — TSH: TSH: 3.164 u[IU]/mL (ref 0.308–3.960)

## 2019-03-16 MED ORDER — SODIUM CHLORIDE 0.9 % IV SOLN
100.0000 mg/m2 | Freq: Once | INTRAVENOUS | Status: AC
  Administered 2019-03-16: 160 mg via INTRAVENOUS
  Filled 2019-03-16: qty 8

## 2019-03-16 MED ORDER — SODIUM CHLORIDE 0.9 % IV SOLN
Freq: Once | INTRAVENOUS | Status: AC
  Administered 2019-03-16: 10:00:00 via INTRAVENOUS
  Filled 2019-03-16: qty 250

## 2019-03-16 MED ORDER — SODIUM CHLORIDE 0.9 % IV SOLN
1500.0000 mg | Freq: Once | INTRAVENOUS | Status: AC
  Administered 2019-03-16: 1500 mg via INTRAVENOUS
  Filled 2019-03-16: qty 30

## 2019-03-16 MED ORDER — SODIUM CHLORIDE 0.9 % IV SOLN
346.5000 mg | Freq: Once | INTRAVENOUS | Status: AC
  Administered 2019-03-16: 350 mg via INTRAVENOUS
  Filled 2019-03-16: qty 35

## 2019-03-16 MED ORDER — PALONOSETRON HCL INJECTION 0.25 MG/5ML
INTRAVENOUS | Status: AC
  Filled 2019-03-16: qty 5

## 2019-03-16 MED ORDER — PALONOSETRON HCL INJECTION 0.25 MG/5ML
0.2500 mg | Freq: Once | INTRAVENOUS | Status: AC
  Administered 2019-03-16: 0.25 mg via INTRAVENOUS

## 2019-03-16 MED ORDER — SODIUM CHLORIDE 0.9 % IV SOLN
Freq: Once | INTRAVENOUS | Status: AC
  Administered 2019-03-16: 10:00:00 via INTRAVENOUS
  Filled 2019-03-16: qty 5

## 2019-03-16 NOTE — Progress Notes (Signed)
[facility] Progress Note   ELEASE Marks [id number] 08-14-1941 78 y.o.  Brittany Marks is managed by Dr. Fanny Marks. Brittany Marks  Actively treated with chemotherapy/immunotherapy/hormonal therapy: yes  Current therapy: carboplatin, etoposide, and Imfinzi  Last treated: 06" 05/2019 (cycle 1, day 1)  Next scheduled appointment with provider: 04/06/2019  Assessment: Plan:    Bleeding from the nose - Plan: Ambulatory referral to ENT  Small cell lung cancer, right upper lobe ([place])   Epistaxis: The patient was instructed to continue using Afrin nasal spray as needed. She was referred to ENT.  Extensive stage small cell lung cancer: The patient presents to the clinic today for cycle 1, day 1 of carboplatin, etoposide, and Imfinzi.  Please see After Visit Summary for patient specific instructions.  Future Appointments  Date Time Provider [place]  03/17/2019  8:00 AM [facility] INFUSION [facility] None  03/18/2019  8:00 AM [facility] INFUSION [facility] None  03/19/2019  3:00 PM [facility] [place] FLUSH [facility] None  03/19/2019  4:00 PM [facility] 1 [facility] [place]  03/23/2019 10:00 AM [facility] LAB 6 [facility] None  03/23/2019 10:30 AM Heilingoetter, Brittany L, PA-C [facility] None  03/24/2019  1:30 PM [facility] NURSE [facility] None  03/24/2019  2:00 PM Kyung Rudd, MD [facility] None  03/30/2019 12:00 PM [facility] LAB 1 [facility] None  04/06/2019  9:15 AM [facility] LAB 1 [facility] None  04/06/2019  9:45 AM Brittany Bears, MD [facility] None  04/06/2019 11:15 AM [facility] INFUSION [facility] None  04/07/2019  3:15 PM [facility] INFUSION [facility] None  04/08/2019  2:15 PM [facility] INFUSION [facility] None  04/13/2019 12:00 PM [facility] LAB 1 [facility] None  04/20/2019 12:00 PM [facility] LAB 3 [facility] None  04/27/2019 10:00 AM [facility] LAB 1 [facility] None  04/27/2019 10:30 AM Heilingoetter, Brittany L, PA-C [facility]  None  04/27/2019 11:15 AM [facility] INFUSION [facility] None  04/28/2019  2:00 PM [facility] INFUSION [facility] None  04/29/2019  2:00 PM [facility] INFUSION [facility] None    Orders Placed This Encounter  Procedures   Ambulatory referral to ENT       Subjective:   Patient ID:  Brittany Marks is a 78 y.o. (DOB 03-25-1941) female.  Chief Complaint: No chief complaint on file.   HPI Brittany Marks   is a 78 year old female with a recently diagnosed extensive stage small cell lung cancer who is managed by Dr. Julien Marks.  She was seen in the infusion room today where she was receiving cycle 1, day 1 of carboplatin, etoposide, and Imfinzi.  She has developed at epitaxis since her visit 1 week ago.  She reports that the bleeding is out of her right nares.  She has had up to 4 nosebleeds per day.  She states that her nosebleeds have been heavy.  She has used pressure and Afrin nasal spray with little benefit.  She has not gone to the emergency room or sought medical attention.  Her labs from today were reviewed.  Her hemoglobin was 12.9 with a hematocrit of 37.6 and a platelet count of 230.  Medications: I have reviewed the patient's current medications.  Allergies: No Known Allergies  Past Medical History:  Diagnosis Date   Cancer ([place])    Cataract    Bilateral   Constipation    pain medication   Left rotator cuff tear    Right rotator cuff tear     Past Surgical History:  Procedure Laterality Date   CATARACT EXTRACTION W/ INTRAOCULAR LENS  IMPLANT, BILATERAL  2012   DECOMPRESSIVE LUMBAR LAMINECTOMY LEVEL 2 N/A 07/29/2013   Procedure: LUMBAR LAMINECTOMY, DECOMPRESSION LUMBAR THREE TO FOUR, FOUR TO FIVE microdiscectomy l3,4 right;  Surgeon: Tobi Bastos, MD;  [facility];  Service: Orthopedics;  Laterality: N/A;   I & D SUPERIOR RIGHT SHOULDER AND CLOSURE WOUND  01-10-2011   S/P ROTATOR CUFF REPAIR   LUMBAR LAMINECTOMY  1970'S   LUMBAR  LAMINECTOMY/DECOMPRESSION MICRODISCECTOMY N/A 06/27/2016   Procedure: L1 - L2 DISCECTOMY;  Surgeon: Melina Schools, MD;  Location: [facility];  Service: Orthopedics;  Laterality: N/A;   LUMBAR SPINE SURGERY  1983   RIGHT SHOULDER ARTHROSCOPY/ OPEN DISTAL CLAVICLE RESECTION/ SAD/ OPEN ROTATOR CUFF REPAIR  11-28-2010   SHOULDER OPEN ROTATOR CUFF REPAIR  12/19/2011   Procedure: ROTATOR CUFF REPAIR SHOULDER OPEN;  Surgeon: Magnus Sinning, MD;  Location: [facility];  Service: Orthopedics;  Laterality: Right;  RIGHT RECURRENT OPEN REPAIR OF THE ROTATOR CUFF WITH TISSUE MEND GRAFTANTERIOR CHROMIOECTOMY   VAGINAL HYSTERECTOMY  1979    Family History  Problem Relation Age of Onset   Congestive Heart Failure Mother    Congestive Heart Failure Father     Social History   Socioeconomic History   Marital status: Divorced    Spouse name: Not on file   Number of children: Not on file   Years of education: Not on file   Highest education level: Not on file  Occupational History   Not on file  Social Needs   Financial resource strain: Not on file   Food insecurity:    Worry: Not on file    Inability: Not on file   Transportation needs:    Medical: Not on file    Non-medical: Not on file  Tobacco Use   Smoking status: Current Every Day Smoker    Packs/day: 1.00    Years: 40.00    Pack years: 40.00    Types: Cigarettes   Smokeless tobacco: Never Used   Tobacco comment: trying to quit now  Substance and Sexual Activity   Alcohol use: Yes    Comment: RARE   Drug use: No   Sexual activity: Not on file  Lifestyle   Physical activity:    Days per week: Not on file    Minutes per session: Not on file   Stress: Not on file  Relationships   Social connections:    Talks on phone: Not on file    Gets together: Not on file    Attends religious service: Not on file    Active member of club or organization: Not on file    Attends meetings of clubs or  organizations: Not on file    Relationship status: Not on file   Intimate partner violence:    Fear of current or ex partner: Not on file    Emotionally abused: Not on file    Physically abused: Not on file    Forced sexual activity: Not on file  Other Topics Concern   Not on file  Social History Narrative   Not on file    Past Medical History, Surgical history, Social history, and Family history were reviewed and updated as appropriate.   Please see review of systems for further details on the patient's review from today.   Review of Systems:  Review of Systems  HENT: Positive for nosebleeds. Negative for rhinorrhea, sneezing, sore throat and trouble swallowing.     Objective:   Physical Exam:  There were no  vitals taken for this visit. ECOG: 1  Physical Exam Constitutional:      Appearance: Normal appearance. She is not ill-appearing, toxic-appearing or diaphoretic.  HENT:     Head: Normocephalic and atraumatic.  Eyes:     General: No scleral icterus.       Right eye: No discharge.        Left eye: No discharge.     Conjunctiva/sclera: Conjunctivae normal.  Neurological:     Mental Status: She is alert.  Psychiatric:        Mood and Affect: Mood normal.        Behavior: Behavior normal.        Thought Content: Thought content normal.        Judgment: Judgment normal.     Lab Review:     Component Value Date/Time   NA 138 03/16/2019 0842   K 3.7 03/16/2019 0842   CL 105 03/16/2019 0842   CO2 22 03/16/2019 0842   GLUCOSE 131 (H) 03/16/2019 0842   BUN 18 03/16/2019 0842   CREATININE 0.79 03/16/2019 0842   CALCIUM 9.6 03/16/2019 0842   PROT 7.4 03/16/2019 0842   ALBUMIN 3.5 03/16/2019 0842   AST 23 03/16/2019 0842   ALT 13 03/16/2019 0842   ALKPHOS 83 03/16/2019 0842   BILITOT 0.3 03/16/2019 0842   GFRNONAA >60 03/16/2019 0842   GFRAA >60 03/16/2019 0842       Component Value Date/Time   WBC 9.0 03/16/2019 0842   WBC 10.5 03/03/2019 1159   RBC  4.17 03/16/2019 0842   HGB 12.9 03/16/2019 0842   HCT 37.6 03/16/2019 0842   PLT 230 03/16/2019 0842   MCV 90.2 03/16/2019 0842   MCH 30.9 03/16/2019 0842   MCHC 34.3 03/16/2019 0842   RDW 12.9 03/16/2019 0842   LYMPHSABS 1.8 03/16/2019 0842   MONOABS 0.8 03/16/2019 0842   EOSABS 0.2 03/16/2019 0842   BASOSABS 0.1 03/16/2019 0842   -------------------------------  Imaging from last 24 hours (if applicable):  Radiology interpretation: Nm Pet Image Initial (pi) Skull Base To Thigh  Result Date: 02/24/2019 CLINICAL DATA:  Initial treatment strategy for workup for lymphoma. Pathologic fracture. EXAM: NUCLEAR MEDICINE PET SKULL BASE TO THIGH TECHNIQUE: 7.3 mCi F-18 FDG was injected intravenously. Full-ring PET imaging was performed from the skull base to thigh after the radiotracer. CT data was obtained and used for attenuation correction and anatomic localization. Fasting blood glucose: 98 mg/dl COMPARISON:  01/01/2019 chest CT. Bone scan 01/01/2019. No prior abdominopelvic imaging. FINDINGS: Mediastinal blood pool activity: SUV max 2.6 Liver activity: SUV max 4.1 NECK: Right palatine tonsil hypermetabolism is favored to be physiologic. Bilateral hypermetabolic cervical nodes, including 8 mm right posterior triangle node on image 25/4. An index left low jugular/supraclavicular node measures 1.0 cm and a S.U.V. max of 8.8 on image 45/4. Incidental CT findings: Dense carotid atherosclerosis bilaterally. CHEST: Precarinal node measures 2.1 cm and a S.U.V. max of 13.5 on image 62/4. Right hilar adenopathy at 2.3 cm and a S.U.V. max of 14.0 on image 72/4. Hypermetabolic right upper lobe pulmonary nodules, including a central nodule which measures 11 mm and a S.U.V. max of 9.1 on image 30/8. Incidental CT findings: Aortic and multivessel coronary artery atherosclerosis. Interstitial lung disease. ABDOMEN/PELVIS: Hypermetabolism corresponding to the porta hepatis mass. This measures on the order of 5.2 x  5.4 cm and a S.U.V. max of 16.7 on image 100/4. This is enlarged from on the order of 4.4 x 4.4 cm  on the prior diagnostic CT. Retroperitoneal hypermetabolic adenopathy, including a 1.5 cm retrocaval node which measures a S.U.V. max of 12.8 on image 105/4. 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 Advanced abdominal aortic and branch vessel atherosclerosis. Hysterectomy. Pelvic floor laxity. SKELETON: Left proximal humerus hypermetabolism, corresponding to nonacute fracture. This measures a S.U.V. max of 15.2. There is also degenerative hypermetabolism about the right glenohumeral joint. Incidental CT findings: none IMPRESSION: 1. Hypermetabolic adenopathy within the low neck, chest, and abdomen. Concurrent hypermetabolic right upper lobe pulmonary nodules. Favor multifocal lymphoma. Metastatic small cell lung cancer could look similar. Process is overall progressive compared to 01/01/2019 chest CT. 2. Hypermetabolism corresponding to nonacute left proximal humerus fracture, favored to be pathologic. 3. Coronary artery atherosclerosis. Aortic Atherosclerosis (ICD10-I70.0). 4. Interstitial lung disease, as on prior CT. 5. 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 Electronically Signed   By: Abigail Miyamoto M.D.   On: 02/24/2019 17:02   [place] Core Biopsy (lymph Nodes)  Result Date: 03/03/2019 INDICATION: No known primary, now with hypermetabolic cervical, mediastinal and retroperitoneal lymphadenopathy. Please from ultrasound-guided biopsy of dominant left cervical lymph node for tissue diagnostic purposes. EXAM: ULTRASOUND-GUIDED LEFT CERVICAL LYMPH NODE BIOPSY COMPARISON:  PET-CT-02/24/2019 MEDICATIONS: None ANESTHESIA/SEDATION: Moderate (conscious) sedation was employed during this procedure. A total of Versed 2 mg and Fentanyl 100 mcg was administered intravenously. Moderate Sedation  Time: 14 minutes. The patient's level of consciousness and vital signs were monitored continuously by radiology nursing throughout the procedure under my direct supervision. COMPLICATIONS: None immediate. TECHNIQUE: Informed written consent was obtained from the patient after a discussion of the risks, benefits and alternatives to treatment. Questions regarding the procedure were encouraged and answered. Initial ultrasound scanning demonstrated an approximately 1.2 x 1.0 cm hypoechoic left cervical lymph node correlating with the hypermetabolic cervical lymph node seen on preceding PET-CT image 44, series 605. An ultrasound image was saved for documentation purposes (image 9). The procedure was planned. A timeout was performed prior to the initiation of the procedure. The operative was prepped and draped in the usual sterile fashion, and a sterile drape was applied covering the operative field. A timeout was performed prior to the initiation of the procedure. Local anesthesia was provided with 1% lidocaine with epinephrine. Under direct ultrasound guidance, an 18 gauge core needle device was utilized to obtain to obtain 6 core needle biopsies of the dominant hypermetabolic left cervical lymph node. The samples were placed in saline and submitted to pathology. The needle was removed and hemostasis was achieved with manual compression. Post procedure scan was negative for significant hematoma. A dressing was placed. The patient tolerated the procedure well without immediate postprocedural complication. IMPRESSION: Technically successful ultrasound guided biopsy of dominant hypermetabolic left cervical lymph node. Electronically Signed   By: Sandi Mariscal M.D.   On: 03/03/2019 14:58

## 2019-03-16 NOTE — Patient Instructions (Signed)
[facility] Discharge Instructions for Patients Receiving Chemotherapy  Today you received the following chemotherapy agents: Imfinzi, Carboplatin, Etoposide   To help prevent nausea and vomiting after your treatment, we encourage you to take your nausea medication as directed.   If you develop nausea and vomiting that is not controlled by your nausea medication, call the clinic.   BELOW ARE SYMPTOMS THAT SHOULD BE REPORTED IMMEDIATELY:  *FEVER GREATER THAN 100.5 F  *CHILLS WITH OR WITHOUT FEVER  NAUSEA AND VOMITING THAT IS NOT CONTROLLED WITH YOUR NAUSEA MEDICATION  *UNUSUAL SHORTNESS OF BREATH  *UNUSUAL BRUISING OR BLEEDING  TENDERNESS IN MOUTH AND THROAT WITH OR WITHOUT PRESENCE OF ULCERS  *URINARY PROBLEMS  *BOWEL PROBLEMS  UNUSUAL RASH Items with * indicate a potential emergency and should be followed up as soon as possible.  Feel free to call the clinic should you have any questions or concerns. The clinic phone number is [phone].  Please show the [place] at check-in to the Emergency Department and triage nurse.  Durvalumab injection What is this medicine? DURVALUMAB (dur VAL ue mab) is a monoclonal antibody. It is used to treat urothelial cancer and lung cancer. This medicine may be used for other purposes; ask your health care provider or pharmacist if you have questions. COMMON BRAND NAME(S): IMFINZI What should I tell my health care provider before I take this medicine? They need to know if you have any of these conditions: -diabetes -immune system problems -infection -inflammatory bowel disease -kidney disease -liver disease -lung or breathing disease -lupus -organ transplant -stomach or intestine problems -thyroid disease -an unusual or allergic reaction to durvalumab, other medicines, foods, dyes, or preservatives -pregnant or trying to get pregnant -breast-feeding How should I use this medicine? This medicine is for  infusion into a vein. It is given by a health care professional in a hospital or clinic setting. A special MedGuide will be given to you before each treatment. Be sure to read this information carefully each time. Talk to your pediatrician regarding the use of this medicine in children. Special care may be needed. Overdosage: If you think you have taken too much of this medicine contact a poison control center or emergency room at once. NOTE: This medicine is only for you. Do not share this medicine with others. What if I miss a dose? It is important not to miss your dose. Call your doctor or health care professional if you are unable to keep an appointment. What may interact with this medicine? Interactions have not been studied. This list may not describe all possible interactions. Give your health care provider a list of all the medicines, herbs, non-prescription drugs, or dietary supplements you use. Also tell them if you smoke, drink alcohol, or use illegal drugs. Some items may interact with your medicine. What should I watch for while using this medicine? This drug may make you feel generally unwell. Continue your course of treatment even though you feel ill unless your doctor tells you to stop. You may need blood work done while you are taking this medicine. Do not become pregnant while taking this medicine or for 3 months after stopping it. Women should inform their doctor if they wish to become pregnant or think they might be pregnant. There is a potential for serious side effects to an unborn child. Talk to your health care professional or pharmacist for more information. Do not breast-feed an infant while taking this medicine or for 3 months after  stopping it. What side effects may I notice from receiving this medicine? Side effects that you should report to your doctor or health care professional as soon as possible: -allergic reactions like skin rash, itching or hives, swelling of the  face, lips, or tongue -black, tarry stools -bloody or watery diarrhea -breathing problems -change in emotions or moods -change in sex drive -changes in vision -chest pain or chest tightness -chills -confusion -cough -facial flushing -fever -headache -signs and symptoms of high blood sugar such as dizziness; dry mouth; dry skin; fruity breath; nausea; stomach pain; increased hunger or thirst; increased urination -signs and symptoms of liver injury like dark yellow or brown urine; general ill feeling or flu-like symptoms; light-colored stools; loss of appetite; nausea; right upper belly pain; unusually weak or tired; yellowing of the eyes or skin -stomach pain -trouble passing urine or change in the amount of urine -weight gain or weight loss Side effects that usually do not require medical attention (report these to your doctor or health care professional if they continue or are bothersome): -bone pain -constipation -loss of appetite -muscle pain -nausea -swelling of the ankles, feet, hands -tiredness This list may not describe all possible side effects. Call your doctor for medical advice about side effects. You may report side effects to FDA at 1-800-FDA-1088. Where should I keep my medicine? This drug is given in a hospital or clinic and will not be stored at home. NOTE: This sheet is a summary. It may not cover all possible information. If you have questions about this medicine, talk to your doctor, pharmacist, or health care provider.  2019 Elsevier/Gold Standard (2016-12-04 19:25:04)  Carboplatin injection What is this medicine? CARBOPLATIN (KAR boe pla tin) is a chemotherapy drug. It targets fast dividing cells, like cancer cells, and causes these cells to die. This medicine is used to treat ovarian cancer and many other cancers. This medicine may be used for other purposes; ask your health care provider or pharmacist if you have questions. COMMON BRAND NAME(S):  Paraplatin What should I tell my health care provider before I take this medicine? They need to know if you have any of these conditions: -blood disorders -hearing problems -kidney disease -recent or ongoing radiation therapy -an unusual or allergic reaction to carboplatin, cisplatin, other chemotherapy, other medicines, foods, dyes, or preservatives -pregnant or trying to get pregnant -breast-feeding How should I use this medicine? This drug is usually given as an infusion into a vein. It is administered in a hospital or clinic by a specially trained health care professional. Talk to your pediatrician regarding the use of this medicine in children. Special care may be needed. Overdosage: If you think you have taken too much of this medicine contact a poison control center or emergency room at once. NOTE: This medicine is only for you. Do not share this medicine with others. What if I miss a dose? It is important not to miss a dose. Call your doctor or health care professional if you are unable to keep an appointment. What may interact with this medicine? -medicines for seizures -medicines to increase blood counts like filgrastim, pegfilgrastim, sargramostim -some antibiotics like amikacin, gentamicin, neomycin, streptomycin, tobramycin -vaccines Talk to your doctor or health care professional before taking any of these medicines: -acetaminophen -aspirin -ibuprofen -ketoprofen -naproxen This list may not describe all possible interactions. Give your health care provider a list of all the medicines, herbs, non-prescription drugs, or dietary supplements you use. Also tell them if you smoke, drink  alcohol, or use illegal drugs. Some items may interact with your medicine. What should I watch for while using this medicine? Your condition will be monitored carefully while you are receiving this medicine. You will need important blood work done while you are taking this medicine. This drug  may make you feel generally unwell. This is not uncommon, as chemotherapy can affect healthy cells as well as cancer cells. Report any side effects. Continue your course of treatment even though you feel ill unless your doctor tells you to stop. In some cases, you may be given additional medicines to help with side effects. Follow all directions for their use. Call your doctor or health care professional for advice if you get a fever, chills or sore throat, or other symptoms of a cold or flu. Do not treat yourself. This drug decreases your body's ability to fight infections. Try to avoid being around people who are sick. This medicine may increase your risk to bruise or bleed. Call your doctor or health care professional if you notice any unusual bleeding. Be careful brushing and flossing your teeth or using a toothpick because you may get an infection or bleed more easily. If you have any dental work done, tell your dentist you are receiving this medicine. Avoid taking products that contain aspirin, acetaminophen, ibuprofen, naproxen, or ketoprofen unless instructed by your doctor. These medicines may hide a fever. Do not become pregnant while taking this medicine. Women should inform their doctor if they wish to become pregnant or think they might be pregnant. There is a potential for serious side effects to an unborn child. Talk to your health care professional or pharmacist for more information. Do not breast-feed an infant while taking this medicine. What side effects may I notice from receiving this medicine? Side effects that you should report to your doctor or health care professional as soon as possible: -allergic reactions like skin rash, itching or hives, swelling of the face, lips, or tongue -signs of infection - fever or chills, cough, sore throat, pain or difficulty passing urine -signs of decreased platelets or bleeding - bruising, pinpoint red spots on the skin, black, tarry stools,  nosebleeds -signs of decreased red blood cells - unusually weak or tired, fainting spells, lightheadedness -breathing problems -changes in hearing -changes in vision -chest pain -high blood pressure -low blood counts - This drug may decrease the number of white blood cells, red blood cells and platelets. You may be at increased risk for infections and bleeding. -nausea and vomiting -pain, swelling, redness or irritation at the injection site -pain, tingling, numbness in the hands or feet -problems with balance, talking, walking -trouble passing urine or change in the amount of urine Side effects that usually do not require medical attention (report to your doctor or health care professional if they continue or are bothersome): -hair loss -loss of appetite -metallic taste in the mouth or changes in taste This list may not describe all possible side effects. Call your doctor for medical advice about side effects. You may report side effects to FDA at 1-800-FDA-1088. Where should I keep my medicine? This drug is given in a hospital or clinic and will not be stored at home. NOTE: This sheet is a summary. It may not cover all possible information. If you have questions about this medicine, talk to your doctor, pharmacist, or health care provider.  2019 Elsevier/Gold Standard (2007-12-30 14:38:05)  Etoposide, VP-16 injection What is this medicine? ETOPOSIDE, VP-16 (e toe POE side) is  a chemotherapy drug. It is used to treat testicular cancer, lung cancer, and other cancers. This medicine may be used for other purposes; ask your health care provider or pharmacist if you have questions. COMMON BRAND NAME(S): Etopophos, Toposar, VePesid What should I tell my health care provider before I take this medicine? They need to know if you have any of these conditions: -infection -kidney disease -liver disease -low blood counts, like low white cell, platelet, or red cell counts -an unusual or  allergic reaction to etoposide, other medicines, foods, dyes, or preservatives -pregnant or trying to get pregnant -breast-feeding How should I use this medicine? This medicine is for infusion into a vein. It is administered in a hospital or clinic by a specially trained health care professional. Talk to your pediatrician regarding the use of this medicine in children. Special care may be needed. Overdosage: If you think you have taken too much of this medicine contact a poison control center or emergency room at once. NOTE: This medicine is only for you. Do not share this medicine with others. What if I miss a dose? It is important not to miss your dose. Call your doctor or health care professional if you are unable to keep an appointment. What may interact with this medicine? -aspirin -certain medications for seizures like carbamazepine, phenobarbital, phenytoin, valproic acid -cyclosporine -levamisole -warfarin This list may not describe all possible interactions. Give your health care provider a list of all the medicines, herbs, non-prescription drugs, or dietary supplements you use. Also tell them if you smoke, drink alcohol, or use illegal drugs. Some items may interact with your medicine. What should I watch for while using this medicine? Visit your doctor for checks on your progress. This drug may make you feel generally unwell. This is not uncommon, as chemotherapy can affect healthy cells as well as cancer cells. Report any side effects. Continue your course of treatment even though you feel ill unless your doctor tells you to stop. In some cases, you may be given additional medicines to help with side effects. Follow all directions for their use. Call your doctor or health care professional for advice if you get a fever, chills or sore throat, or other symptoms of a cold or flu. Do not treat yourself. This drug decreases your body's ability to fight infections. Try to avoid being  around people who are sick. This medicine may increase your risk to bruise or bleed. Call your doctor or health care professional if you notice any unusual bleeding. Talk to your doctor about your risk of cancer. You may be more at risk for certain types of cancers if you take this medicine. Do not become pregnant while taking this medicine or for at least 6 months after stopping it. Women should inform their doctor if they wish to become pregnant or think they might be pregnant. Women of child-bearing potential will need to have a negative pregnancy test before starting this medicine. There is a potential for serious side effects to an unborn child. Talk to your health care professional or pharmacist for more information. Do not breast-feed an infant while taking this medicine. Men must use a latex condom during sexual contact with a woman while taking this medicine and for at least 4 months after stopping it. A latex condom is needed even if you have had a vasectomy. Contact your doctor right away if your partner becomes pregnant. Do not donate sperm while taking this medicine and for at least 4 months  after you stop taking this medicine. Men should inform their doctors if they wish to father a child. This medicine may lower sperm counts. What side effects may I notice from receiving this medicine? Side effects that you should report to your doctor or health care professional as soon as possible: -allergic reactions like skin rash, itching or hives, swelling of the face, lips, or tongue -low blood counts - this medicine may decrease the number of white blood cells, red blood cells and platelets. You may be at increased risk for infections and bleeding. -signs of infection - fever or chills, cough, sore throat, pain or difficulty passing urine -signs of decreased platelets or bleeding - bruising, pinpoint red spots on the skin, black, tarry stools, blood in the urine -signs of decreased red blood cells -  unusually weak or tired, fainting spells, lightheadedness -breathing problems -changes in vision -mouth or throat sores or ulcers -pain, redness, swelling or irritation at the injection site -pain, tingling, numbness in the hands or feet -redness, blistering, peeling or loosening of the skin, including inside the mouth -seizures -vomiting Side effects that usually do not require medical attention (report to your doctor or health care professional if they continue or are bothersome): -diarrhea -hair loss -loss of appetite -nausea -stomach pain This list may not describe all possible side effects. Call your doctor for medical advice about side effects. You may report side effects to FDA at 1-800-FDA-1088. Where should I keep my medicine? This drug is given in a hospital or clinic and will not be stored at home. NOTE: This sheet is a summary. It may not cover all possible information. If you have questions about this medicine, talk to your doctor, pharmacist, or health care provider.  2019 Elsevier/Gold Standard (2015-09-16 11:53:23)

## 2019-03-17 ENCOUNTER — Other Ambulatory Visit: Payer: Self-pay | Admitting: Medical Oncology

## 2019-03-17 ENCOUNTER — Other Ambulatory Visit: Payer: Self-pay

## 2019-03-17 ENCOUNTER — Inpatient Hospital Stay: Payer: Federal, State, Local not specified - PPO

## 2019-03-17 VITALS — BP 155/84 | HR 84 | Temp 97.9°F | Resp 20

## 2019-03-17 DIAGNOSIS — C3411 Malignant neoplasm of upper lobe, right bronchus or lung: Secondary | ICD-10-CM | POA: Diagnosis not present

## 2019-03-17 DIAGNOSIS — I878 Other specified disorders of veins: Secondary | ICD-10-CM

## 2019-03-17 MED ORDER — DEXAMETHASONE SODIUM PHOSPHATE 10 MG/ML IJ SOLN
10.0000 mg | Freq: Once | INTRAMUSCULAR | Status: AC
  Administered 2019-03-17: 10 mg via INTRAVENOUS

## 2019-03-17 MED ORDER — SODIUM CHLORIDE 0.9 % IV SOLN
100.0000 mg/m2 | Freq: Once | INTRAVENOUS | Status: AC
  Administered 2019-03-17: 160 mg via INTRAVENOUS
  Filled 2019-03-17: qty 8

## 2019-03-17 MED ORDER — DEXAMETHASONE SODIUM PHOSPHATE 10 MG/ML IJ SOLN
INTRAMUSCULAR | Status: AC
  Filled 2019-03-17: qty 1

## 2019-03-17 MED ORDER — SODIUM CHLORIDE 0.9 % IV SOLN
Freq: Once | INTRAVENOUS | Status: AC
  Administered 2019-03-17: 09:00:00 via INTRAVENOUS
  Filled 2019-03-17: qty 250

## 2019-03-17 NOTE — Patient Instructions (Addendum)
[facility] Discharge Instructions for Patients Receiving Chemotherapy  Today you received the following chemotherapy agents: Etoposide  To help prevent nausea and vomiting after your treatment, we encourage you to take your nausea medication as directed.   If you develop nausea and vomiting that is not controlled by your nausea medication, call the clinic.   BELOW ARE SYMPTOMS THAT SHOULD BE REPORTED IMMEDIATELY:  *FEVER GREATER THAN 100.5 F  *CHILLS WITH OR WITHOUT FEVER  NAUSEA AND VOMITING THAT IS NOT CONTROLLED WITH YOUR NAUSEA MEDICATION  *UNUSUAL SHORTNESS OF BREATH  *UNUSUAL BRUISING OR BLEEDING  TENDERNESS IN MOUTH AND THROAT WITH OR WITHOUT PRESENCE OF ULCERS  *URINARY PROBLEMS  *BOWEL PROBLEMS  UNUSUAL RASH Items with * indicate a potential emergency and should be followed up as soon as possible.  Feel free to call the clinic should you have any questions or concerns. The clinic phone number is [phone].  Please show the [place] at check-in to the Emergency Department and triage nurse.   [employer], Adult A central line is a thin, flexible tube (catheter) that is put in your vein. It can be used to:  Give you medicine.  Give you food and nutrients. Follow these instructions at home: Caring for the tube   Follow instructions from your doctor about: ? Flushing the tube with saline solution. ? Cleaning the tube and the area around it.  Only flush with clean (sterile) supplies. The supplies should be from your doctor, a pharmacy, or another place that your doctor recommends.  Before you flush the tube or clean the area around the tube: ? Wash your hands with soap and water. If you cannot use soap and water, use hand sanitizer. ? Clean the central line hub with rubbing alcohol. Caring for your skin  Keep the area where the tube was put in clean and dry.  Every day, and when changing the bandage, check the skin around the  central line for: ? Redness, swelling, or pain. ? Fluid or blood. ? Warmth. ? Pus. ? A bad smell. General instructions  Keep the tube clamped, unless it is being used.  Keep your supplies in a clean, dry location.  If you or someone else accidentally pulls on the tube, make sure: ? The bandage (dressing) is okay. ? There is no bleeding. ? The tube has not been pulled out.  Do not use scissors or sharp objects near the tube.  Do not swim or let the tube soak in a tub.  Ask your doctor what activities are safe for you. Your doctor may tell you not to lift anything or move your arm too much.  Take over-the-counter and prescription medicines only as told by your doctor.  Change bandages as told by your doctor.  Keep your bandage dry. If a bandage gets wet, have it changed right away.  Keep all follow-up visits as told by your doctor. This is important. Throwing away supplies  Throw away any syringes in a trash (disposal) container that is only for sharp items (sharps container). You can buy a sharps container from a pharmacy, or you can make one by using an empty hard plastic bottle with a cover.  Place any used bandages or infusion bags into a plastic bag. Throw that bag in the trash. Contact a doctor if:  You have any of these where the tube was put in: ? Redness, swelling, or pain. ? Fluid or blood. ? A warm feeling. ?  Pus or a bad smell. Get help right away if:  You have: ? A fever. ? Chills. ? Trouble getting enough air (shortness of breath). ? Trouble breathing. ? Pain in your chest. ? Swelling in your neck, face, chest, or arm.  You are coughing.  You feel your heart beating fast or skipping beats.  You feel dizzy or you pass out (faint).  There are red lines coming from where the tube was put in.  The area where the tube was put in is bleeding and the bleeding will not stop.  Your tube is hard to flush.  You do not get a blood return from the  tube.  The tube gets loose or comes out.  The tube has a hole or a tear.  The tube leaks. Summary  A central line is a thin, flexible tube (catheter) that is put in your vein. It can be used to take blood for lab tests or to give you medicine.  Follow instructions from your doctor about flushing and cleaning the tube.  Keep the area where the tube was put in clean and dry.  Ask your doctor what activities are safe for you. This information is not intended to replace advice given to you by your health care provider. Make sure you discuss any questions you have with your health care provider. Document Released: 09/10/2012 Document Revised: 10/11/2016 Document Reviewed: 10/11/2016 Elsevier Interactive Patient Education  2019 [employer].

## 2019-03-18 ENCOUNTER — Other Ambulatory Visit: Payer: Self-pay

## 2019-03-18 ENCOUNTER — Telehealth: Payer: Self-pay | Admitting: Medical Oncology

## 2019-03-18 ENCOUNTER — Inpatient Hospital Stay: Payer: Federal, State, Local not specified - PPO

## 2019-03-18 VITALS — BP 145/69 | HR 78 | Temp 98.0°F | Resp 16

## 2019-03-18 DIAGNOSIS — C3411 Malignant neoplasm of upper lobe, right bronchus or lung: Secondary | ICD-10-CM | POA: Diagnosis not present

## 2019-03-18 MED ORDER — SODIUM CHLORIDE 0.9 % IV SOLN
100.0000 mg/m2 | Freq: Once | INTRAVENOUS | Status: AC
  Administered 2019-03-18: 160 mg via INTRAVENOUS
  Filled 2019-03-18: qty 8

## 2019-03-18 MED ORDER — DEXAMETHASONE SODIUM PHOSPHATE 10 MG/ML IJ SOLN
INTRAMUSCULAR | Status: AC
  Filled 2019-03-18: qty 1

## 2019-03-18 MED ORDER — DEXAMETHASONE SODIUM PHOSPHATE 10 MG/ML IJ SOLN
10.0000 mg | Freq: Once | INTRAMUSCULAR | Status: AC
  Administered 2019-03-18: 10 mg via INTRAVENOUS

## 2019-03-18 MED ORDER — SODIUM CHLORIDE 0.9 % IV SOLN
Freq: Once | INTRAVENOUS | Status: AC
  Administered 2019-03-18: 09:00:00 via INTRAVENOUS
  Filled 2019-03-18: qty 250

## 2019-03-18 NOTE — Progress Notes (Signed)
Thoracic Location of Tumor / Histology: Small cell lung cancer, right upper lobe  Patient presented with left shoulder pain, she thought it was due to her rotator cuff.  PET 1/50/5697: Hypermetabolic adenopathy within the lower neck, chest, and abdomen.  There was also hypermetabolic right upper lobe pulmonary nodules.  The finding has a differential diagnosis to include lymphoma versus metastatic small cell lung cancer.  CT Chest 01/01/2019: mediastinal lymphadenopathy in the right paratracheal region with the largest lymph node measuring 2.6 cm.  There was also right hilar lymphadenopathy with the largest lymph node measuring 3.2 cm and left hilar adenopathy measuring 1.2 cm.  There was also a soft tissue mass within the porta hepatis measuring 0.4 x 4.8 cm likely representing lymphadenopathy.  A retroperitoneal lymph node was seen in the left para-aortic region measuring 1.6 cm.  This scan also showed fracture of the left humeral neck which may be pathologic.  Chest xray 12/26/2018: focal opacity in the right mid-lung zone with left lower atelectasis and questionable pathologic fracture of the left proximal humerus.  Biopsies of lymph node   Tobacco/Marijuana/Snuff/ETOH use: Using nicoderm to quit  Past/Anticipated interventions by cardiothoracic surgery, if any:   Past/Anticipated interventions by medical oncology, if any:  Dr. Julien Nordmann 03/10/2019 - I explained to the patient that she has incurable condition and oriented treatment would be of palliative nature. -I recommend for the patient to complete the staging work-up by ordering MRI of the brain to rule out brain metastasis. -I will refer the patient to radiation oncology for consideration of palliative radiotherapy to the metastatic disease of the left humerus. -I discussed with the patient the treatment options and gave her the option of palliative care and hospice referral versus consideration of palliative systemic chemotherapy with  carboplatin for AUC of 5 on day 1, etoposide 100 mg/m2 on days 1, 2 and 3 as well as imfinzi 1500 mg every 3 weeks with the chemotherapy and neulasta support. -The patient is interested in treatment. -Chemo started 03/16/2019.   Signs/Symptoms  Weight changes, if any: Lost about 15 pounds since February/March  Respiratory complaints, if any: No issues voiced.  Hemoptysis, if any: Productive cough, clear/yellow phlegm.  Pain issues, if any:  Arm and lower back pain 4/10.  SAFETY ISSUES:  Prior radiation? No  Pacemaker/ICD? No  Possible current pregnancy? Hysterectomy  Is the patient on methotrexate? No  Current Complaints / other details:

## 2019-03-18 NOTE — Patient Instructions (Signed)
[facility] Discharge Instructions for Patients Receiving Chemotherapy  Today you received the following chemotherapy agents:  etoposide  To help prevent nausea and vomiting after your treatment, we encourage you to take your nausea medication as prescribed.   If you develop nausea and vomiting that is not controlled by your nausea medication, call the clinic.   BELOW ARE SYMPTOMS THAT SHOULD BE REPORTED IMMEDIATELY:  *FEVER GREATER THAN 100.5 F  *CHILLS WITH OR WITHOUT FEVER  NAUSEA AND VOMITING THAT IS NOT CONTROLLED WITH YOUR NAUSEA MEDICATION  *UNUSUAL SHORTNESS OF BREATH  *UNUSUAL BRUISING OR BLEEDING  TENDERNESS IN MOUTH AND THROAT WITH OR WITHOUT PRESENCE OF ULCERS  *URINARY PROBLEMS  *BOWEL PROBLEMS  UNUSUAL RASH Items with * indicate a potential emergency and should be followed up as soon as possible.  Feel free to call the clinic should you have any questions or concerns. The clinic phone number is [phone].  Please show the [facility] at check-in to the Emergency Department and triage nurse.  Coronavirus (COVID-19) Are you at risk?  Are you at risk for the Coronavirus (COVID-19)?  To be considered HIGH RISK for Coronavirus (COVID-19), you have to meet the following criteria:  . Traveled to [place], [place], [place], [place] or [place]; or in the [place] to [place], [place], [place], or [place]; and have fever, cough, and shortness of breath within the last 2 weeks of travel OR . Been in close contact with a person diagnosed with COVID-19 within the last 2 weeks and have fever, cough, and shortness of breath . IF YOU DO NOT MEET THESE CRITERIA, YOU ARE CONSIDERED LOW RISK FOR COVID-19.  What to do if you are HIGH RISK for COVID-19?  Marland Kitchen If you are having a medical emergency, call 911. . Seek medical care right away. Before you go to a doctor's office, urgent care or emergency department, call ahead and tell them about your  recent travel, contact with someone diagnosed with COVID-19, and your symptoms. You should receive instructions from your physician's office regarding next steps of care.  . When you arrive at healthcare provider, tell the healthcare staff immediately you have returned from visiting [place], [place], [place], [place] or [place]; or traveled in the [place] to [place], [place], [place], or [place]; in the last two weeks or you have been in close contact with a person diagnosed with COVID-19 in the last 2 weeks.   . Tell the health care staff about your symptoms: fever, cough and shortness of breath. . After you have been seen by a medical provider, you will be either: o Tested for (COVID-19) and discharged home on quarantine except to seek medical care if symptoms worsen, and asked to  - Stay home and avoid contact with others until you get your results (4-5 days)  - Avoid travel on public transportation if possible (such as bus, train, or airplane) or o Sent to the Emergency Department by EMS for evaluation, COVID-19 testing, and possible admission depending on your condition and test results.  What to do if you are LOW RISK for COVID-19?  Reduce your risk of any infection by using the same precautions used for avoiding the common cold or flu:  Marland Kitchen Wash your hands often with soap and warm water for at least 20 seconds.  If soap and water are not readily available, use an alcohol-based hand sanitizer with at least 60% alcohol.  . If coughing or  sneezing, cover your mouth and nose by coughing or sneezing into the elbow areas of your shirt or coat, into a tissue or into your sleeve (not your hands). . Avoid shaking hands with others and consider head nods or verbal greetings only. . Avoid touching your eyes, nose, or mouth with unwashed hands.  . Avoid close contact with people who are sick. . Avoid places or events with large numbers of people in one location, like concerts or sporting  events. . Carefully consider travel plans you have or are making. . If you are planning any travel outside or inside the [place], visit the CDC's Travelers' Health webpage for the latest health notices. . If you have some symptoms but not all symptoms, continue to monitor at home and seek medical attention if your symptoms worsen. . If you are having a medical emergency, call 911.   [facility] / e-Visit: [email or web address]         [facility]: [facility]: [phone]                   [facility]: [phone]

## 2019-03-18 NOTE — Telephone Encounter (Signed)
Someone called her with inaudible message. I told her I was not aware of any call to her.

## 2019-03-19 ENCOUNTER — Ambulatory Visit (HOSPITAL_COMMUNITY)
Admission: RE | Admit: 2019-03-19 | Discharge: 2019-03-19 | Disposition: A | Discharge status: Home / Self Care | Payer: Federal, State, Local not specified - PPO | Source: Ambulatory Visit | Attending: Internal Medicine | Admitting: Internal Medicine

## 2019-03-19 ENCOUNTER — Other Ambulatory Visit: Payer: Self-pay

## 2019-03-19 ENCOUNTER — Inpatient Hospital Stay: Payer: Federal, State, Local not specified - PPO

## 2019-03-19 ENCOUNTER — Ambulatory Visit: Payer: Federal, State, Local not specified - PPO

## 2019-03-19 ENCOUNTER — Other Ambulatory Visit: Payer: Self-pay | Admitting: Internal Medicine

## 2019-03-19 VITALS — BP 118/70 | HR 74 | Temp 98.0°F | Resp 17

## 2019-03-19 DIAGNOSIS — C3411 Malignant neoplasm of upper lobe, right bronchus or lung: Secondary | ICD-10-CM

## 2019-03-19 MED ORDER — PEGFILGRASTIM-CBQV 6 MG/0.6ML ~~LOC~~ SOSY
PREFILLED_SYRINGE | SUBCUTANEOUS | Status: AC
  Filled 2019-03-19: qty 0.6

## 2019-03-19 MED ORDER — PEGFILGRASTIM-CBQV 6 MG/0.6ML ~~LOC~~ SOSY
6.0000 mg | PREFILLED_SYRINGE | Freq: Once | SUBCUTANEOUS | Status: AC
  Administered 2019-03-19: 6 mg via SUBCUTANEOUS

## 2019-03-19 MED ORDER — GADOBUTROL 1 MMOL/ML IV SOLN
6.0000 mL | Freq: Once | INTRAVENOUS | Status: AC | PRN
  Administered 2019-03-19: 6 mL via INTRAVENOUS

## 2019-03-19 NOTE — Patient Instructions (Signed)
Pegfilgrastim injection  What is this medicine?  PEGFILGRASTIM (PEG fil gra stim) is a long-acting granulocyte colony-stimulating factor that stimulates the growth of neutrophils, a type of white blood cell important in the body's fight against infection. It is used to reduce the incidence of fever and infection in patients with certain types of cancer who are receiving chemotherapy that affects the bone marrow, and to increase survival after being exposed to high doses of radiation.  This medicine may be used for other purposes; ask your health care provider or pharmacist if you have questions.  COMMON BRAND NAME(S): Fulphila, Neulasta, UDENYCA  What should I tell my health care provider before I take this medicine?  They need to know if you have any of these conditions:  -kidney disease  -latex allergy  -ongoing radiation therapy  -sickle cell disease  -skin reactions to acrylic adhesives (On-Body Injector only)  -an unusual or allergic reaction to pegfilgrastim, filgrastim, other medicines, foods, dyes, or preservatives  -pregnant or trying to get pregnant  -breast-feeding  How should I use this medicine?  This medicine is for injection under the skin. If you get this medicine at home, you will be taught how to prepare and give the pre-filled syringe or how to use the On-body Injector. Refer to the patient Instructions for Use for detailed instructions. Use exactly as directed. Tell your healthcare provider immediately if you suspect that the On-body Injector may not have performed as intended or if you suspect the use of the On-body Injector resulted in a missed or partial dose.  It is important that you put your used needles and syringes in a special sharps container. Do not put them in a trash can. If you do not have a sharps container, call your pharmacist or healthcare provider to get one.  Talk to your pediatrician regarding the use of this medicine in children. While this drug may be prescribed for  selected conditions, precautions do apply.  Overdosage: If you think you have taken too much of this medicine contact a poison control center or emergency room at once.  NOTE: This medicine is only for you. Do not share this medicine with others.  What if I miss a dose?  It is important not to miss your dose. Call your doctor or health care professional if you miss your dose. If you miss a dose due to an On-body Injector failure or leakage, a new dose should be administered as soon as possible using a single prefilled syringe for manual use.  What may interact with this medicine?  Interactions have not been studied.  Give your health care provider a list of all the medicines, herbs, non-prescription drugs, or dietary supplements you use. Also tell them if you smoke, drink alcohol, or use illegal drugs. Some items may interact with your medicine.  This list may not describe all possible interactions. Give your health care provider a list of all the medicines, herbs, non-prescription drugs, or dietary supplements you use. Also tell them if you smoke, drink alcohol, or use illegal drugs. Some items may interact with your medicine.  What should I watch for while using this medicine?  You may need blood work done while you are taking this medicine.  If you are going to need a MRI, CT scan, or other procedure, tell your doctor that you are using this medicine (On-Body Injector only).  What side effects may I notice from receiving this medicine?  Side effects that you should report to   your doctor or health care professional as soon as possible:  -allergic reactions like skin rash, itching or hives, swelling of the face, lips, or tongue  -back pain  -dizziness  -fever  -pain, redness, or irritation at site where injected  -pinpoint red spots on the skin  -red or dark-brown urine  -shortness of breath or breathing problems  -stomach or side pain, or pain at the shoulder  -swelling  -tiredness  -trouble passing urine or  change in the amount of urine  Side effects that usually do not require medical attention (report to your doctor or health care professional if they continue or are bothersome):  -bone pain  -muscle pain  This list may not describe all possible side effects. Call your doctor for medical advice about side effects. You may report side effects to FDA at 1-800-FDA-1088.  Where should I keep my medicine?  Keep out of the reach of children.  If you are using this medicine at home, you will be instructed on how to store it. Throw away any unused medicine after the expiration date on the label.  NOTE: This sheet is a summary. It may not cover all possible information. If you have questions about this medicine, talk to your doctor, pharmacist, or health care provider.   2019 [employer] Standard (2017-12-30 16:57:08)

## 2019-03-23 ENCOUNTER — Inpatient Hospital Stay (HOSPITAL_BASED_OUTPATIENT_CLINIC_OR_DEPARTMENT_OTHER): Payer: Federal, State, Local not specified - PPO | Admitting: Physician Assistant

## 2019-03-23 ENCOUNTER — Inpatient Hospital Stay: Payer: Federal, State, Local not specified - PPO

## 2019-03-23 ENCOUNTER — Other Ambulatory Visit: Payer: Self-pay

## 2019-03-23 ENCOUNTER — Encounter: Payer: Self-pay | Admitting: Physician Assistant

## 2019-03-23 VITALS — BP 155/63 | HR 85 | Temp 98.0°F | Resp 20 | Ht 62.0 in | Wt 135.2 lb

## 2019-03-23 DIAGNOSIS — F17210 Nicotine dependence, cigarettes, uncomplicated: Secondary | ICD-10-CM

## 2019-03-23 DIAGNOSIS — Z5111 Encounter for antineoplastic chemotherapy: Secondary | ICD-10-CM

## 2019-03-23 DIAGNOSIS — C3411 Malignant neoplasm of upper lobe, right bronchus or lung: Secondary | ICD-10-CM

## 2019-03-23 DIAGNOSIS — K5900 Constipation, unspecified: Secondary | ICD-10-CM

## 2019-03-23 DIAGNOSIS — C7951 Secondary malignant neoplasm of bone: Secondary | ICD-10-CM

## 2019-03-23 DIAGNOSIS — I6523 Occlusion and stenosis of bilateral carotid arteries: Secondary | ICD-10-CM

## 2019-03-23 DIAGNOSIS — Z7689 Persons encountering health services in other specified circumstances: Secondary | ICD-10-CM

## 2019-03-23 DIAGNOSIS — Z79899 Other long term (current) drug therapy: Secondary | ICD-10-CM

## 2019-03-23 DIAGNOSIS — M545 Low back pain: Secondary | ICD-10-CM

## 2019-03-23 DIAGNOSIS — I10 Essential (primary) hypertension: Secondary | ICD-10-CM

## 2019-03-23 DIAGNOSIS — R590 Localized enlarged lymph nodes: Secondary | ICD-10-CM

## 2019-03-23 DIAGNOSIS — R040 Epistaxis: Secondary | ICD-10-CM

## 2019-03-23 DIAGNOSIS — I2510 Atherosclerotic heart disease of native coronary artery without angina pectoris: Secondary | ICD-10-CM

## 2019-03-23 DIAGNOSIS — M84422D Pathological fracture, left humerus, subsequent encounter for fracture with routine healing: Secondary | ICD-10-CM

## 2019-03-23 DIAGNOSIS — J849 Interstitial pulmonary disease, unspecified: Secondary | ICD-10-CM

## 2019-03-23 LAB — CBC WITH DIFFERENTIAL (CANCER CENTER ONLY)
Abs Immature Granulocytes: 0.2 10*3/uL — ABNORMAL HIGH (ref 0.00–0.07)
Basophils Absolute: 0.1 10*3/uL (ref 0.0–0.1)
Basophils Relative: 1 %
Eosinophils Absolute: 0.2 10*3/uL (ref 0.0–0.5)
Eosinophils Relative: 2 %
HCT: 33.4 % — ABNORMAL LOW (ref 36.0–46.0)
Hemoglobin: 11.1 g/dL — ABNORMAL LOW (ref 12.0–15.0)
Immature Granulocytes: 2 %
Lymphocytes Relative: 16 %
Lymphs Abs: 1.9 10*3/uL (ref 0.7–4.0)
MCH: 30.7 pg (ref 26.0–34.0)
MCHC: 33.2 g/dL (ref 30.0–36.0)
MCV: 92.5 fL (ref 80.0–100.0)
Monocytes Absolute: 0.6 10*3/uL (ref 0.1–1.0)
Monocytes Relative: 5 %
Neutro Abs: 9.4 10*3/uL — ABNORMAL HIGH (ref 1.7–7.7)
Neutrophils Relative %: 74 %
Platelet Count: 154 10*3/uL (ref 150–400)
RBC: 3.61 MIL/uL — ABNORMAL LOW (ref 3.87–5.11)
RDW: 13 % (ref 11.5–15.5)
WBC Count: 12.5 10*3/uL — ABNORMAL HIGH (ref 4.0–10.5)
nRBC: 0 % (ref 0.0–0.2)

## 2019-03-23 LAB — CMP (CANCER CENTER ONLY)
ALT: 22 U/L (ref 0–44)
AST: 20 U/L (ref 15–41)
Albumin: 3.4 g/dL — ABNORMAL LOW (ref 3.5–5.0)
Alkaline Phosphatase: 125 U/L (ref 38–126)
Anion gap: 9 (ref 5–15)
BUN: 21 mg/dL (ref 8–23)
CO2: 22 mmol/L (ref 22–32)
Calcium: 9 mg/dL (ref 8.9–10.3)
Chloride: 103 mmol/L (ref 98–111)
Creatinine: 0.65 mg/dL (ref 0.44–1.00)
GFR, Est AFR Am: >60 mL/min (ref >60)
GFR, Estimated: >60 mL/min (ref >60)
Glucose, Bld: 102 mg/dL — ABNORMAL HIGH (ref 70–99)
Potassium: 3.9 mmol/L (ref 3.5–5.1)
Sodium: 134 mmol/L — ABNORMAL LOW (ref 135–145)
Total Bilirubin: 0.4 mg/dL (ref 0.3–1.2)
Total Protein: 6.7 g/dL (ref 6.5–8.1)

## 2019-03-23 MED ORDER — LIDOCAINE-PRILOCAINE 2.5-2.5 % EX CREA: 1.0000 "application " | TOPICAL_CREAM | CUTANEOUS | 0 refills | Status: DC | PRN

## 2019-03-23 NOTE — Progress Notes (Signed)
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  PRIOR THERAPY: None  CURRENT THERAPY: Palliative systemic chemotherapy with carboplatin for AUC of 5 on day 1, etoposide 100 mg/M2 on days 1, 2 and 3 as well as Imfinzi 1500 mg every 3 weeks with the chemotherapy and Neulasta support. First dose on 03/16/2019. Status post 1 cycle.   INTERVAL HISTORY: Brittany Marks 78 y.o. female returns to the clinic for a follow-up visit.  The patient completed her first cycle of chemotherapy last week and tolerated it well without any adverse effects except for one episode of nausea which was relieved with compazine.  She is feeling well today and denies any fever, chills, night sweats, or weight loss. She denies any chest pain, shortness of breath, or hemoptysis. She has a baseline cough which occasionally keeps her awake at night. She has not tried taking anything to alleviate the cough. She denies any nausea, vomiting, diarrhea, or constipation.  She denies any headache or visual changes.  She denies any rashes or skin changes. She is using NicoDerm patches to assist with smoking cessation.  She states her last cigarette was 1 week ago. Since her last appointment, she has experienced nose bleeds from the right nostril. She states her nose bleeds for approximately 15 minutes. She was given afrin spray. She is scheduled to see the ENT today regarding this problem. She is here for a one-week follow-up after completing her first cycle of treatment.  MEDICAL HISTORY: Past Medical History:  Diagnosis Date  . Cancer ([place])   . Cataract    Bilateral  . Constipation    pain  medication  . Left rotator cuff tear   . Right rotator cuff tear     ALLERGIES:  has No Known Allergies.  MEDICATIONS:  Current Outpatient Medications  Medication Sig Dispense Refill  . b complex vitamins tablet Take 1 tablet by mouth daily.    . COD LIVER OIL PO Take 1 tablet by mouth daily.     Marland Kitchen lidocaine-prilocaine (EMLA) cream Apply 1 application topically as needed. 30 g 0  . Multiple Vitamin (MULTIVITAMIN) tablet Take 1 tablet by mouth daily.    . nicotine (NICODERM CQ) 21 mg/24hr patch Place 1 patch (21 mg total) onto the skin daily. 28 patch 0  . oxymetazoline (AFRIN) 0.05 % nasal spray Place 2 sprays into the nose 2 (two) times daily as needed for congestion.    Vladimir Faster Glycol-Propyl Glycol (SYSTANE) 0.4-0.3 % SOLN Apply 1 drop to eye 2 (two) times daily as needed (dry eyes).    . prochlorperazine (COMPAZINE) 10 MG tablet Take 1 tablet (10 mg total) by mouth every 6 (six) hours as needed for nausea or vomiting. 30 tablet 0  . sodium chloride (OCEAN) 0.65 % nasal spray Place 1 spray into the nose 2 (two) times daily as needed for congestion.    . vitamin C (ASCORBIC ACID) 500 MG tablet Take 500 mg by mouth daily.    . vitamin E 400 UNIT capsule Take 400 Units by mouth daily.     No current facility-administered medications for this visit.     SURGICAL HISTORY:  Past Surgical History:  Procedure Laterality Date  . CATARACT EXTRACTION W/ INTRAOCULAR LENS  IMPLANT, BILATERAL  2012  . DECOMPRESSIVE LUMBAR LAMINECTOMY LEVEL 2 N/A 07/29/2013   Procedure: LUMBAR LAMINECTOMY, DECOMPRESSION LUMBAR THREE TO FOUR, FOUR TO FIVE microdiscectomy l3,4 right;  Surgeon: Tobi Bastos, MD;  Location: [facility];  Service: Orthopedics;  Laterality: N/A;  . I & D SUPERIOR RIGHT SHOULDER AND CLOSURE WOUND  01-10-2011   S/P ROTATOR CUFF REPAIR  . LUMBAR LAMINECTOMY  1970'S  . LUMBAR LAMINECTOMY/DECOMPRESSION MICRODISCECTOMY N/A 06/27/2016   Procedure: L1 - L2 DISCECTOMY;  Surgeon: Melina Schools, MD;  Location: [facility];  Service: Orthopedics;  Laterality: N/A;  . [place]  . RIGHT SHOULDER ARTHROSCOPY/ OPEN DISTAL CLAVICLE RESECTION/ SAD/ OPEN ROTATOR CUFF REPAIR  11-28-2010  . SHOULDER OPEN ROTATOR CUFF REPAIR  12/19/2011   Procedure: ROTATOR CUFF REPAIR SHOULDER OPEN;  Surgeon: Magnus Sinning, MD;  Location: [facility];  Service: Orthopedics;  Laterality: Right;  RIGHT RECURRENT OPEN REPAIR OF THE ROTATOR CUFF WITH TISSUE MEND GRAFTANTERIOR CHROMIOECTOMY  . VAGINAL HYSTERECTOMY  1979    REVIEW OF SYSTEMS:   Review of Systems  Constitutional: Negative for appetite change, chills, fatigue, fever and unexpected weight change.  HENT: Positive for right nostril nose bleeding. Negative for mouth sores, sore throat and trouble swallowing.   Eyes: Negative for eye problems and icterus.  Respiratory: Positive for baseline cough. Negative for hemoptysis, shortness of breath and wheezing.   Cardiovascular: Negative for chest pain and leg swelling.  Gastrointestinal: Positive for one episode of nausea following chemotherapy (resolved). Negative for abdominal pain, constipation, diarrhea, and vomiting.  Genitourinary: Negative for bladder incontinence, difficulty urinating, dysuria, frequency and hematuria.   Musculoskeletal: Negative for back pain, gait problem, neck pain and neck stiffness.  Skin: Negative for itching and rash.  Neurological: Negative for dizziness, extremity weakness, gait problem, headaches, light-headedness and seizures.  Hematological: Negative for adenopathy. Does not bruise/bleed easily.  Psychiatric/Behavioral: Negative for confusion, depression and sleep disturbance. The patient is not nervous/anxious.     PHYSICAL EXAMINATION:  Blood pressure (!) 155/63, pulse 85, temperature 98 F (36.7 C), resp. rate 20, height 5\' 2"  (1.575 m), weight 135 lb 3.2 oz (61.3 kg), SpO2 100 %.  ECOG PERFORMANCE STATUS: 1 - Symptomatic but  completely ambulatory  Physical Exam  Constitutional: Oriented to person, place, and time and well-developed, well-nourished, and in no distress.  HENT:  Head: Normocephalic and atraumatic.  Mouth/Throat: Oropharynx is clear and moist. No oropharyngeal exudate.  Eyes: Conjunctivae are normal. Right eye exhibits no discharge. Left eye exhibits no discharge. No scleral icterus.  Neck: Normal range of motion. Neck supple.  Cardiovascular: Normal rate, regular rhythm, normal heart sounds and intact distal pulses.   Pulmonary/Chest: Crackles noted bilaterally. Effort normal. No respiratory distress. No wheezes.   Abdominal: Soft. Bowel sounds are normal. Exhibits no distension and no mass. There is no tenderness.  Musculoskeletal: Normal range of motion. Exhibits no edema.  Lymphadenopathy:    No cervical adenopathy.  Neurological: Alert and oriented to person, place, and time. Exhibits normal muscle tone. Gait normal. Coordination normal.  Skin: Skin is warm and dry. No rash noted. Not diaphoretic. No erythema. No pallor.  Psychiatric: Mood, memory and judgment normal.  Vitals reviewed.  LABORATORY DATA: Lab Results  Component Value Date   WBC 12.5 (H) 03/23/2019   HGB 11.1 (L) 03/23/2019   HCT 33.4 (L) 03/23/2019   MCV 92.5 03/23/2019   PLT 154  03/23/2019      Chemistry      Component Value Date/Time   NA 134 (L) 03/23/2019 1037   K 3.9 03/23/2019 1037   CL 103 03/23/2019 1037   CO2 22 03/23/2019 1037   BUN 21 03/23/2019 1037   CREATININE 0.65 03/23/2019 1037      Component Value Date/Time   CALCIUM 9.0 03/23/2019 1037   ALKPHOS 125 03/23/2019 1037   AST 20 03/23/2019 1037   ALT 22 03/23/2019 1037   BILITOT 0.4 03/23/2019 1037       RADIOGRAPHIC STUDIES:  Mr Jeri Cos AO Contrast  Result Date: 03/19/2019 CLINICAL DATA:  Small cell lung cancer staging EXAM: MRI HEAD WITHOUT AND WITH CONTRAST TECHNIQUE: Multiplanar, multiecho pulse sequences of the brain and surrounding  structures were obtained without and with intravenous contrast. CONTRAST:  6 mL Gadavist COMPARISON:  None. FINDINGS: BRAIN: There is no acute infarct, acute hemorrhage or extra-axial collection. The midline structures are normal. No midline shift or other mass effect. Early confluent hyperintense T2-weighted signal of the periventricular and deep white matter, most commonly due to chronic ischemic microangiopathy. Generalized atrophy without lobar predilection. Susceptibility-sensitive sequences show no chronic microhemorrhage or superficial siderosis. No abnormal contrast enhancement. VASCULAR: The major intracranial arterial and venous sinus flow voids are normal. SKULL AND UPPER CERVICAL SPINE: Calvarial bone marrow signal is normal. There is no skull base mass. Visualized upper cervical spine and soft tissues are normal. SINUSES/ORBITS: No fluid levels or advanced mucosal thickening. No mastoid or middle ear effusion. The orbits are normal. IMPRESSION: 1. No intracranial metastatic disease. 2. Chronic small vessel ischemia and generalized volume loss. Electronically Signed   By: Ulyses Jarred M.D.   On: 03/19/2019 22:09   Nm Pet Image Initial (pi) Skull Base To Thigh  Result Date: 02/24/2019 CLINICAL DATA:  Initial treatment strategy for workup for lymphoma. Pathologic fracture. EXAM: NUCLEAR MEDICINE PET SKULL BASE TO THIGH TECHNIQUE: 7.3 mCi F-18 FDG was injected intravenously. Full-ring PET imaging was performed from the skull base to thigh after the radiotracer. CT data was obtained and used for attenuation correction and anatomic localization. Fasting blood glucose: 98 mg/dl COMPARISON:  01/01/2019 chest CT. Bone scan 01/01/2019. No prior abdominopelvic imaging. FINDINGS: Mediastinal blood pool activity: SUV max 2.6 Liver activity: SUV max 4.1 NECK: Right palatine tonsil hypermetabolism is favored to be physiologic. Bilateral hypermetabolic cervical nodes, including 8 mm right posterior triangle node  on image 25/4. An index left low jugular/supraclavicular node measures 1.0 cm and a S.U.V. max of 8.8 on image 45/4. Incidental CT findings: Dense carotid atherosclerosis bilaterally. CHEST: Precarinal node measures 2.1 cm and a S.U.V. max of 13.5 on image 62/4. Right hilar adenopathy at 2.3 cm and a S.U.V. max of 14.0 on image 72/4. Hypermetabolic right upper lobe pulmonary nodules, including a central nodule which measures 11 mm and a S.U.V. max of 9.1 on image 30/8. Incidental CT findings: Aortic and multivessel coronary artery atherosclerosis. Interstitial lung disease. ABDOMEN/PELVIS: Hypermetabolism corresponding to the porta hepatis mass. This measures on the order of 5.2 x 5.4 cm and a S.U.V. max of 16.7 on image 100/4. This is enlarged from on the order of 4.4 x 4.4 cm on the prior diagnostic CT. Retroperitoneal hypermetabolic adenopathy, including a 1.5 cm retrocaval node which measures a S.U.V. max of 12.8 on image 105/4. 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 Advanced abdominal aortic and branch vessel atherosclerosis. Hysterectomy. Pelvic floor laxity. SKELETON: Left  proximal humerus hypermetabolism, corresponding to nonacute fracture. This measures a S.U.V. max of 15.2. There is also degenerative hypermetabolism about the right glenohumeral joint. Incidental CT findings: none IMPRESSION: 1. Hypermetabolic adenopathy within the low neck, chest, and abdomen. Concurrent hypermetabolic right upper lobe pulmonary nodules. Favor multifocal lymphoma. Metastatic small cell lung cancer could look similar. Process is overall progressive compared to 01/01/2019 chest CT. 2. Hypermetabolism corresponding to nonacute left proximal humerus fracture, favored to be pathologic. 3. Coronary artery atherosclerosis. Aortic Atherosclerosis (ICD10-I70.0). 4. Interstitial lung disease, as on prior CT. 5. Right extrarenal pelvis with possible  component of mild chronic ureteropelvic junction obstruction. Electronically Signed   By: Abigail Miyamoto M.D.   On: 02/24/2019 17:02   [place] Core Biopsy (lymph Nodes)  Result Date: 03/03/2019 INDICATION: No known primary, now with hypermetabolic cervical, mediastinal and retroperitoneal lymphadenopathy. Please from ultrasound-guided biopsy of dominant left cervical lymph node for tissue diagnostic purposes. EXAM: ULTRASOUND-GUIDED LEFT CERVICAL LYMPH NODE BIOPSY COMPARISON:  PET-CT-02/24/2019 MEDICATIONS: None ANESTHESIA/SEDATION: Moderate (conscious) sedation was employed during this procedure. A total of Versed 2 mg and Fentanyl 100 mcg was administered intravenously. Moderate Sedation Time: 14 minutes. The patient's level of consciousness and vital signs were monitored continuously by radiology nursing throughout the procedure under my direct supervision. COMPLICATIONS: None immediate. TECHNIQUE: Informed written consent was obtained from the patient after a discussion of the risks, benefits and alternatives to treatment. Questions regarding the procedure were encouraged and answered. Initial ultrasound scanning demonstrated an approximately 1.2 x 1.0 cm hypoechoic left cervical lymph node correlating with the hypermetabolic cervical lymph node seen on preceding PET-CT image 44, series 605. An ultrasound image was saved for documentation purposes (image 9). The procedure was planned. A timeout was performed prior to the initiation of the procedure. The operative was prepped and draped in the usual sterile fashion, and a sterile drape was applied covering the operative field. A timeout was performed prior to the initiation of the procedure. Local anesthesia was provided with 1% lidocaine with epinephrine. Under direct ultrasound guidance, an 18 gauge core needle device was utilized to obtain to obtain 6 core needle biopsies of the dominant hypermetabolic left cervical lymph node. The samples were placed in saline  and submitted to pathology. The needle was removed and hemostasis was achieved with manual compression. Post procedure scan was negative for significant hematoma. A dressing was placed. The patient tolerated the procedure well without immediate postprocedural complication. IMPRESSION: Technically successful ultrasound guided biopsy of dominant hypermetabolic left cervical lymph node. Electronically Signed   By: Sandi Mariscal M.D.   On: 03/03/2019 14:58     ASSESSMENT/PLAN:  This is a very pleasant 78 year old Caucasian female recently diagnosed with extensive stage (T1b, and 3, N1 C) small cell lung cancer.  She presented with right upper lobe pulmonary nodules in addition to bilateral hilar and right mediastinal as well as left supraclavicular and abdominal lymphadenopathy.  She also has metastatic disease to the in the left humerus.  She was diagnosed in May 2020.  The patient is currently undergoing palliative systemic chemotherapy with carboplatin for an AUC of 5 on day 1, and etoposide 100 mg/m on days 1, 2, and 3 as well as Imfinzi 1500 mg IV every 3 weeks with Neulasta support.  She is status post 1 cycle.  She tolerated her first treatment well without any adverse effects except for mild nausea which was managed with compazine.   The patient was seen with Dr. Julien Nordmann today.  Labs were reviewed  with the patient. We recommend she continue on the same treatment.   We will see her back for follow-up visit in 2 weeks for evaluation before starting cycle #2.  The patient is interested in a port-a-cath placement due to poor venous access. A referral was placed. I have sent a prescription for EMLA cream to her pharmacy.  For smoking cessation, the patient will continue to use Nicoderm patches.   Her blood pressure was noted to be elevated again today. The patient denies any history of hypertension. The patient has a blood pressure cuff at home. I recommended that the patient check her blood pressure  at home and keep a log of her readings. If her blood pressure continues to be consistently elevated at home, I encouraged the patient to follow up with her PCP on recommendations for anti-hypertensive management.   She will follow up with ENT today regarding her nose bleeds as scheduled.   The patient was advised to call immediately if she has any concerning symptoms in the interval. The patient voices understanding of current disease status and treatment options and is in agreement with the current care plan. All questions were answered. The patient knows to call the clinic with any problems, questions or concerns. We can certainly see the patient much sooner if necessary  No orders of the defined types were placed in this encounter.    Cassandra L Heilingoetter, PA-C 03/23/19  ADDENDUM: Hematology/Oncology Attending: I had a face-to-face encounter with the patient today.  I recommended her care plan.  This is a very pleasant 78 years old white female recently diagnosed with extensive stage small cell lung cancer and she is currently undergoing systemic chemotherapy with carboplatin, etoposide and Imfinzi status post 1 cycle started last week.  She tolerated the first week of her treatment well with no concerning adverse effects except for mild nausea improved with Compazine. I recommended for the patient to continue her treatment as planned.  She will also have a Port-A-Cath placed for IV access. She will come back for follow-up visit in 2 weeks for evaluation before starting cycle #2. The patient was advised to call immediately if she has any concerning symptoms in the interval.  Disclaimer: This note was dictated with voice recognition software. Similar sounding words can inadvertently be transcribed and may be missed upon review. Eilleen Kempf, MD 03/23/19

## 2019-03-24 ENCOUNTER — Other Ambulatory Visit: Payer: Self-pay

## 2019-03-24 ENCOUNTER — Ambulatory Visit
Admission: RE | Admit: 2019-03-24 | Discharge: 2019-03-24 | Disposition: A | Discharge status: Home / Self Care | Payer: Medicare Other | Source: Ambulatory Visit | Attending: Radiation Oncology | Admitting: Radiation Oncology

## 2019-03-24 ENCOUNTER — Encounter: Payer: Self-pay | Admitting: Radiation Oncology

## 2019-03-24 ENCOUNTER — Ambulatory Visit
Admission: RE | Admit: 2019-03-24 | Discharge: 2019-03-24 | Disposition: A | Discharge status: Home / Self Care | Payer: Medicare Other | Source: Ambulatory Visit | Attending: Internal Medicine | Admitting: Internal Medicine

## 2019-03-24 VITALS — Ht 62.0 in | Wt 135.0 lb

## 2019-03-24 DIAGNOSIS — C3411 Malignant neoplasm of upper lobe, right bronchus or lung: Secondary | ICD-10-CM

## 2019-03-24 NOTE — Progress Notes (Signed)
Radiation Oncology         [phone] ________________________________  Initial Outpatient Consultation - Conducted via telephone due to current COVID-19 concerns for limiting patient exposure  I spoke with the patient to conduct this consult visit via telephone to spare the patient unnecessary potential exposure in the healthcare setting during the current COVID-19 pandemic. The patient was notified in advance and was offered a [facility] meeting to allow for face to face communication but unfortunately reported that they did not have the appropriate resources/technology to support such a visit and instead preferred to proceed with a telephone consult.  ________________________________  Name: Brittany Marks        MRN: [id number]  Date of Service: 03/24/2019 DOB: 21-Jun-1941  EX:[id number], No Pcp Per  Curt Bears, MD     REFERRING PHYSICIAN: Curt Bears, MD   DIAGNOSIS: The encounter diagnosis was Small cell lung cancer, right upper lobe ([place]).   HISTORY OF PRESENT ILLNESS: Brittany Marks is a 78 y.o. female seen at the request of Dr. 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 The patient had been experiencing symptoms of what she thought was a torn rotator cuff and was seen by Dr. Onnie Graham.  She had no prior history of cancer, but presented with a pathologic fracture of the proximal left humerus.  This was noted on outside imaging.  She was referred to oncology, and on 326 a CT chest with contrast and bone scan were performed.  There was concern for degenerative uptake at the shoulders, thoracolumbar spine and T1-2 junction.  Uptake was previously seen in the proximal right humeral fracture unable to differentiate between traumatic and pathologic bony fracture.  No other osseous appearing disease was present.  Right hydronephrosis and a dilated right renal pelvis was previously seen and appeared to be persistent.  Her CT chest with contrast revealed a  2.6 cm right paratracheal lymph node as well as right hilar adenopathy measuring 3.2 cm.  Left hilar adenopathy was also noted measuring 1.2 cm.  No suspicious pulmonary nodules were identified.  She also had a 0.4 x 4.8 cm area of adenopathy along the porta hepatis and a retroperitoneal node in the left periaortic region measuring 16 mm.  Fracture in the left humeral neck is seen consistent with pathologic fracture.  She underwent a PET scan on 02/24/2019 revealing hypermetabolic adenopathy within the low neck, chest and abdomen and hypermetabolic right upper lobe nodules.  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  She underwent a ultrasound-guided biopsy of a left cervical lymph node on 03/03/2019 and biopsy was consistent with a small cell carcinoma.  An MRI of the brain on 03/19/2019 was negative for metastatic disease.  She began systemic chemotherapy with Dr. Julien Nordmann on 03/16/2019, and has plans to start her second cycle on 04/06/2019.  She is seen to discuss options of palliative radiation to her left humerus.     PREVIOUS RADIATION THERAPY: No   PAST MEDICAL HISTORY:  Past Medical History:  Diagnosis Date   Cancer ([place])    Cataract    Bilateral   Constipation    pain medication   Left rotator cuff tear    Right rotator cuff tear        PAST SURGICAL HISTORY: Past Surgical History:  Procedure Laterality Date   CATARACT EXTRACTION W/ INTRAOCULAR LENS  IMPLANT, BILATERAL  2012   DECOMPRESSIVE LUMBAR LAMINECTOMY LEVEL 2 N/A  07/29/2013   Procedure: LUMBAR LAMINECTOMY, DECOMPRESSION LUMBAR THREE TO FOUR, FOUR TO FIVE microdiscectomy l3,4 right;  Surgeon: Tobi Bastos, MD;  Location: [facility];  Service: Orthopedics;  Laterality: N/A;   I & D SUPERIOR RIGHT SHOULDER AND CLOSURE WOUND  01-10-2011   S/P ROTATOR CUFF REPAIR   LUMBAR LAMINECTOMY  1970'S   LUMBAR LAMINECTOMY/DECOMPRESSION MICRODISCECTOMY N/A  06/27/2016   Procedure: L1 - L2 DISCECTOMY;  Surgeon: Melina Schools, MD;  Location: [place];  Service: Orthopedics;  Laterality: N/A;   LUMBAR SPINE SURGERY  1983   RIGHT SHOULDER ARTHROSCOPY/ OPEN DISTAL CLAVICLE RESECTION/ SAD/ OPEN ROTATOR CUFF REPAIR  11-28-2010   SHOULDER OPEN ROTATOR CUFF REPAIR  12/19/2011   Procedure: ROTATOR CUFF REPAIR SHOULDER OPEN;  Surgeon: Magnus Sinning, MD;  Location: [place];  Service: Orthopedics;  Laterality: Right;  RIGHT RECURRENT OPEN REPAIR OF THE ROTATOR CUFF WITH TISSUE MEND GRAFTANTERIOR CHROMIOECTOMY   VAGINAL HYSTERECTOMY  1979     FAMILY HISTORY:  Family History  Problem Relation Age of Onset   Congestive Heart Failure Mother    Congestive Heart Failure Father      SOCIAL HISTORY:  reports that she has quit smoking. Her smoking use included cigarettes. She has a 40.00 pack-year smoking history. She has never used smokeless tobacco. She reports current alcohol use. She reports that she does not use drugs.   ALLERGIES: Diclofenac   MEDICATIONS:  Current Outpatient Medications  Medication Sig Dispense Refill   b complex vitamins tablet Take 1 tablet by mouth daily.     COD LIVER OIL PO Take 1 tablet by mouth daily.      lidocaine-prilocaine (EMLA) cream Apply 1 application topically as needed. 30 g 0   Multiple Vitamin (MULTIVITAMIN) tablet Take 1 tablet by mouth daily.     nicotine (NICODERM CQ) 21 mg/24hr patch Place 1 patch (21 mg total) onto the skin daily. 28 patch 0   oxymetazoline (AFRIN) 0.05 % nasal spray Place 2 sprays into the nose 2 (two) times daily as needed for congestion.     Polyethyl Glycol-Propyl Glycol (SYSTANE) 0.4-0.3 % SOLN Apply 1 drop to eye 2 (two) times daily as needed (dry eyes).     prochlorperazine (COMPAZINE) 10 MG tablet Take 1 tablet (10 mg total) by mouth every 6 (six) hours as needed for nausea or vomiting. 30 tablet 0   sodium chloride (OCEAN) 0.65 % nasal spray Place 1  spray into the nose 2 (two) times daily as needed for congestion.     vitamin C (ASCORBIC ACID) 500 MG tablet Take 500 mg by mouth daily.     vitamin E 400 UNIT capsule Take 400 Units by mouth daily.     No current facility-administered medications for this encounter.      REVIEW OF SYSTEMS: On review of systems, the patient reports that she is doing well overall.  She describes her pain in the left shoulder to be rather bothersome but finds that she would rather take over-the-counter NSAIDs for her symptoms then consider narcotics.  She denies any chest pain, shortness of breath, cough, fevers, chills, night sweats, unintended weight changes.  She denies any bowel or bladder disturbances, and denies abdominal pain, nausea or vomiting.  She denies any other musculoskeletal or joint aches or pains. A complete review of systems is obtained and is otherwise negative.     PHYSICAL EXAM:  Wt Readings from Last 3 Encounters:  03/24/19 135 lb (61.2 kg)  03/23/19  135 lb 3.2 oz (61.3 kg)  03/16/19 132 lb 8 oz (60.1 kg)   Temp Readings from Last 3 Encounters:  03/23/19 98 F (36.7 C)  03/19/19 98 F (36.7 C) (Oral)  03/18/19 98 F (36.7 C) (Oral)   BP Readings from Last 3 Encounters:  03/23/19 (!) 155/63  03/19/19 118/70  03/18/19 (!) 145/69   Pulse Readings from Last 3 Encounters:  03/23/19 85  03/19/19 74  03/18/19 78   Pain Assessment Pain Score: 4 (Constant)/10 Unable to assess due to encounter type   ECOG = 1  0 - Asymptomatic (Fully active, able to carry on all predisease activities without restriction)  1 - Symptomatic but completely ambulatory (Restricted in physically strenuous activity but ambulatory and able to carry out work of a light or sedentary nature. For example, light housework, office work)  2 - Symptomatic, <50% in bed during the day (Ambulatory and capable of all self care but unable to carry out any work activities. Up and about more than 50% of waking  hours)  3 - Symptomatic, >50% in bed, but not bedbound (Capable of only limited self-care, confined to bed or chair 50% or more of waking hours)  4 - Bedbound (Completely disabled. Cannot carry on any self-care. Totally confined to bed or chair)  5 - Death   Eustace Pen MM, Creech RH, Tormey DC, et al. [phone]). "Toxicity and response criteria of the [facility]". [place] Oncol. 5 (6): 649-55    LABORATORY DATA:  Lab Results  Component Value Date   WBC 12.5 (H) 03/23/2019   HGB 11.1 (L) 03/23/2019   HCT 33.4 (L) 03/23/2019   MCV 92.5 03/23/2019   PLT 154 03/23/2019   Lab Results  Component Value Date   NA 134 (L) 03/23/2019   K 3.9 03/23/2019   CL 103 03/23/2019   CO2 22 03/23/2019   Lab Results  Component Value Date   ALT 22 03/23/2019   AST 20 03/23/2019   ALKPHOS 125 03/23/2019   BILITOT 0.4 03/23/2019      RADIOGRAPHY: Mr Jeri Cos XF Contrast  Result Date: 03/19/2019 CLINICAL DATA:  Small cell lung cancer staging EXAM: MRI HEAD WITHOUT AND WITH CONTRAST TECHNIQUE: Multiplanar, multiecho pulse sequences of the brain and surrounding structures were obtained without and with intravenous contrast. CONTRAST:  6 mL Gadavist COMPARISON:  None. FINDINGS: BRAIN: There is no acute infarct, acute hemorrhage or extra-axial collection. The midline structures are normal. No midline shift or other mass effect. Early confluent hyperintense T2-weighted signal of the periventricular and deep white matter, most commonly due to chronic ischemic microangiopathy. Generalized atrophy without lobar predilection. Susceptibility-sensitive sequences show no chronic microhemorrhage or superficial siderosis. No abnormal contrast enhancement. VASCULAR: The major intracranial arterial and venous sinus flow voids are normal. SKULL AND UPPER CERVICAL SPINE: Calvarial bone marrow signal is normal. There is no skull base mass. Visualized upper cervical spine and soft tissues are normal.  SINUSES/ORBITS: No fluid levels or advanced mucosal thickening. No mastoid or middle ear effusion. The orbits are normal. IMPRESSION: 1. No intracranial metastatic disease. 2. Chronic small vessel ischemia and generalized volume loss. Electronically Signed   By: Ulyses Jarred M.D.   On: 03/19/2019 22:09   Nm Pet Image Initial (pi) Skull Base To Thigh  Result Date: 02/24/2019 CLINICAL DATA:  Initial treatment strategy for workup for lymphoma. Pathologic fracture. EXAM: NUCLEAR MEDICINE PET SKULL BASE TO THIGH TECHNIQUE: 7.3 mCi F-18 FDG was injected intravenously. Full-ring PET  imaging was performed from the skull base to thigh after the radiotracer. CT data was obtained and used for attenuation correction and anatomic localization. Fasting blood glucose: 98 mg/dl COMPARISON:  01/01/2019 chest CT. Bone scan 01/01/2019. No prior abdominopelvic imaging. FINDINGS: Mediastinal blood pool activity: SUV max 2.6 Liver activity: SUV max 4.1 NECK: Right palatine tonsil hypermetabolism is favored to be physiologic. Bilateral hypermetabolic cervical nodes, including 8 mm right posterior triangle node on image 25/4. An index left low jugular/supraclavicular node measures 1.0 cm and a S.U.V. max of 8.8 on image 45/4. Incidental CT findings: Dense carotid atherosclerosis bilaterally. CHEST: Precarinal node measures 2.1 cm and a S.U.V. max of 13.5 on image 62/4. Right hilar adenopathy at 2.3 cm and a S.U.V. max of 14.0 on image 72/4. Hypermetabolic right upper lobe pulmonary nodules, including a central nodule which measures 11 mm and a S.U.V. max of 9.1 on image 30/8. Incidental CT findings: Aortic and multivessel coronary artery atherosclerosis. Interstitial lung disease. ABDOMEN/PELVIS: Hypermetabolism corresponding to the porta hepatis mass. This measures on the order of 5.2 x 5.4 cm and a S.U.V. max of 16.7 on image 100/4. This is enlarged from on the order of 4.4 x 4.4 cm on the prior diagnostic CT. Retroperitoneal  hypermetabolic adenopathy, including a 1.5 cm retrocaval node which measures a S.U.V. max of 12.8 on image 105/4. 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 Advanced abdominal aortic and branch vessel atherosclerosis. Hysterectomy. Pelvic floor laxity. SKELETON: Left proximal humerus hypermetabolism, corresponding to nonacute fracture. This measures a S.U.V. max of 15.2. There is also degenerative hypermetabolism about the right glenohumeral joint. Incidental CT findings: none IMPRESSION: 1. Hypermetabolic adenopathy within the low neck, chest, and abdomen. Concurrent hypermetabolic right upper lobe pulmonary nodules. Favor multifocal lymphoma. Metastatic small cell lung cancer could look similar. Process is overall progressive compared to 01/01/2019 chest CT. 2. Hypermetabolism corresponding to nonacute left proximal humerus fracture, favored to be pathologic. 3. Coronary artery atherosclerosis. Aortic Atherosclerosis (ICD10-I70.0). 4. Interstitial lung disease, as on prior CT. 5. 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 Electronically Signed   By: Abigail Miyamoto M.D.   On: 02/24/2019 17:02   [place] Core Biopsy (lymph Nodes)  Result Date: 03/03/2019 INDICATION: No known primary, now with hypermetabolic cervical, mediastinal and retroperitoneal lymphadenopathy. Please from ultrasound-guided biopsy of dominant left cervical lymph node for tissue diagnostic purposes. EXAM: ULTRASOUND-GUIDED LEFT CERVICAL LYMPH NODE BIOPSY COMPARISON:  PET-CT-02/24/2019 MEDICATIONS: None ANESTHESIA/SEDATION: Moderate (conscious) sedation was employed during this procedure. A total of Versed 2 mg and Fentanyl 100 mcg was administered intravenously. Moderate Sedation Time: 14 minutes. The patient's level of consciousness and vital signs were monitored continuously by radiology nursing throughout the  procedure under my direct supervision. COMPLICATIONS: None immediate. TECHNIQUE: Informed written consent was obtained from the patient after a discussion of the risks, benefits and alternatives to treatment. Questions regarding the procedure were encouraged and answered. Initial ultrasound scanning demonstrated an approximately 1.2 x 1.0 cm hypoechoic left cervical lymph node correlating with the hypermetabolic cervical lymph node seen on preceding PET-CT image 44, series 605. An ultrasound image was saved for documentation purposes (image 9). The procedure was planned. A timeout was performed prior to the initiation of the procedure. The operative was prepped and draped in the usual sterile fashion, and a sterile drape was applied covering the operative field. A timeout was performed prior to the initiation of the procedure. Local anesthesia was provided  with 1% lidocaine with epinephrine. Under direct ultrasound guidance, an 18 gauge core needle device was utilized to obtain to obtain 6 core needle biopsies of the dominant hypermetabolic left cervical lymph node. The samples were placed in saline and submitted to pathology. The needle was removed and hemostasis was achieved with manual compression. Post procedure scan was negative for significant hematoma. A dressing was placed. The patient tolerated the procedure well without immediate postprocedural complication. IMPRESSION: Technically successful ultrasound guided biopsy of dominant hypermetabolic left cervical lymph node. Electronically Signed   By: Sandi Mariscal M.D.   On: 03/03/2019 14:58       IMPRESSION/PLAN: 1. 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  Dr. Lisbeth Renshaw joins the conversation and discusses the findings from imaging as well as her recent pathology.  He discusses the rationale for palliative radiotherapy and scenarios such as this, and would offer the patient a course of 2  weeks of palliative therapy.  Given the fact that she is undergoing concurrent chemotherapy however that would extend her course to 3 weeks.  We discussed the risks, benefits, short and long-term effects of radiotherapy, and the patient is interested in proceeding.  She was given a appointment for Friday of this week to come in for simulation.  We reviewed verbal confirmation that she would like to proceed and gives consent for this.  Formal documentation will be outlined later this week.  I will also follow-up with Dr. Onnie Graham so that he is included.  It is uncertain whether there would be any additional role for surgical intervention at a later time. 2. Pain secondary to #1.  The patient was offered a prescription for tramadol as she does not tolerate narcotic therapy as well and specifically felt quite sleepy when trying oxycodone in the past.  She declines tramadol as well, but will let us know if she changes her mind.  Her kidney function appears satisfactory to proceed with NSAID therapy in the interim.  Hopefully radiotherapy will benefit her and reduce her symptoms by the second week of treatment. 3. PCI.  I discussed with the patient the risks of developing metastatic disease to the brain given the histology type and that the this is increased up to 60% over the course of her illness.  If she does well with chemotherapy, and is able to achieve observation, we could consider prophylactic cranial irradiation which would reduce that risk to 20%.  She states agreement and understanding and we will revisit this at a later time. 4. Transportation concerns.  I will reach back out to her transportation department to help with coordination of her transportation for appointments.  Given current concerns for patient exposure during the COVID-19 pandemic, this encounter was conducted via telephone.  The patient has given verbal consent for this type of encounter. The time spent during this encounter was 45  minutes and 50% of that time was spent in the coordination of his care. The attendants for this meeting include Dr. Lisbeth Renshaw, Shona Simpson, [facility] and Rayburn Felt  During the encounter, Dr. Lisbeth Renshaw and Shona Simpson [facility] were located at [facility] Radiation Oncology Department.  Brittany Marks  was located at home.     Carola Rhine, PAC

## 2019-03-25 ENCOUNTER — Telehealth: Payer: Self-pay

## 2019-03-25 ENCOUNTER — Telehealth: Payer: Self-pay | Admitting: Physician Assistant

## 2019-03-25 NOTE — Telephone Encounter (Signed)
Nutrition Assessment:  RD working remotely.  Patient identified on Malnutrition Screening report for weight loss and poor appetite.  78 year old female with small cell lung cancer with metastatic disease to left humerus.  Patient receiving palliative chemotherapy. Past medical history reviewed.  Spoke with patient via phone and introduce self and service at [facility].  Patient reports taste alterations and lack of desire to eat are the biggest things impacting nutrition.  Reports foods taste "flat".  Can taste sweeter things a little bit better.  Reports eating small meals during the day.  Drinks premier protein.    Medications: reviewed  Labs: reviewed  Anthropometrics:   Height: 62 inches Weight: 135 lb Noted 147 lb on 3/20/202 BMI: 24  8% weight loss in the last 3 months   Estimated Energy Needs  Kcals: 1800-2100 Protein: 90-105 g Fluid: 2.1 L  NUTRITION DIAGNOSIS: Inadequate oral intake related to cancer and cancer related treatment side effects as evidenced by 8% weight loss in the last 3 monts   INTERVENTION:  Discussed strategies to help with taste changes. Discussed high calorie, high protein strategies as well. Encouraged high calorie oral nutrition supplement.  Contact information provided.  Patient will contact RD if needed in the future    MONITORING, EVALUATION, GOAL: patient will consume adequate calories and protein to maintain weight   NEXT VISIT: patient to contact  Kesha Hurrell B. Zenia Resides, [place], [place] Registered Dietitian [phone] (pager)

## 2019-03-25 NOTE — Telephone Encounter (Signed)
Per 6/15 los Appointments as already scheduled.

## 2019-03-27 ENCOUNTER — Other Ambulatory Visit: Payer: Self-pay

## 2019-03-27 ENCOUNTER — Ambulatory Visit
Admission: RE | Admit: 2019-03-27 | Discharge: 2019-03-27 | Disposition: A | Discharge status: Home / Self Care | Payer: Federal, State, Local not specified - PPO | Source: Ambulatory Visit | Attending: Radiation Oncology | Admitting: Radiation Oncology

## 2019-03-27 DIAGNOSIS — Z510 Encounter for antineoplastic radiation therapy: Secondary | ICD-10-CM | POA: Diagnosis present

## 2019-03-27 DIAGNOSIS — C3411 Malignant neoplasm of upper lobe, right bronchus or lung: Secondary | ICD-10-CM | POA: Insufficient documentation

## 2019-03-27 DIAGNOSIS — C7951 Secondary malignant neoplasm of bone: Secondary | ICD-10-CM | POA: Diagnosis not present

## 2019-03-27 DIAGNOSIS — C7952 Secondary malignant neoplasm of bone marrow: Secondary | ICD-10-CM

## 2019-03-30 ENCOUNTER — Other Ambulatory Visit: Payer: Self-pay

## 2019-03-30 ENCOUNTER — Encounter: Payer: Self-pay | Admitting: Internal Medicine

## 2019-03-30 ENCOUNTER — Inpatient Hospital Stay: Payer: Federal, State, Local not specified - PPO

## 2019-03-30 ENCOUNTER — Telehealth: Payer: Self-pay | Admitting: *Deleted

## 2019-03-30 DIAGNOSIS — C3411 Malignant neoplasm of upper lobe, right bronchus or lung: Secondary | ICD-10-CM | POA: Diagnosis not present

## 2019-03-30 LAB — CBC WITH DIFFERENTIAL (CANCER CENTER ONLY)
Abs Immature Granulocytes: 2.52 10*3/uL — ABNORMAL HIGH (ref 0.00–0.07)
Basophils Absolute: 0.2 10*3/uL — ABNORMAL HIGH (ref 0.0–0.1)
Basophils Relative: 1 %
Eosinophils Absolute: 0.1 10*3/uL (ref 0.0–0.5)
Eosinophils Relative: 1 %
HCT: 35.9 % — ABNORMAL LOW (ref 36.0–46.0)
Hemoglobin: 11.8 g/dL — ABNORMAL LOW (ref 12.0–15.0)
Immature Granulocytes: 12 %
Lymphocytes Relative: 18 %
Lymphs Abs: 3.6 10*3/uL (ref 0.7–4.0)
MCH: 30.3 pg (ref 26.0–34.0)
MCHC: 32.9 g/dL (ref 30.0–36.0)
MCV: 92.3 fL (ref 80.0–100.0)
Monocytes Absolute: 2.1 10*3/uL — ABNORMAL HIGH (ref 0.1–1.0)
Monocytes Relative: 10 %
Neutro Abs: 11.9 10*3/uL — ABNORMAL HIGH (ref 1.7–7.7)
Neutrophils Relative %: 58 %
Platelet Count: 133 10*3/uL — ABNORMAL LOW (ref 150–400)
RBC: 3.89 MIL/uL (ref 3.87–5.11)
RDW: 13.6 % (ref 11.5–15.5)
WBC Count: 20.5 10*3/uL — ABNORMAL HIGH (ref 4.0–10.5)
nRBC: 0.2 % (ref 0.0–0.2)

## 2019-03-30 LAB — CMP (CANCER CENTER ONLY)
ALT: 22 U/L (ref 0–44)
AST: 25 U/L (ref 15–41)
Albumin: 3.3 g/dL — ABNORMAL LOW (ref 3.5–5.0)
Alkaline Phosphatase: 127 U/L — ABNORMAL HIGH (ref 38–126)
Anion gap: 7 (ref 5–15)
BUN: 17 mg/dL (ref 8–23)
CO2: 25 mmol/L (ref 22–32)
Calcium: 9.1 mg/dL (ref 8.9–10.3)
Chloride: 103 mmol/L (ref 98–111)
Creatinine: 0.73 mg/dL (ref 0.44–1.00)
GFR, Est AFR Am: >60 mL/min (ref >60)
GFR, Estimated: >60 mL/min (ref >60)
Glucose, Bld: 95 mg/dL (ref 70–99)
Potassium: 4.1 mmol/L (ref 3.5–5.1)
Sodium: 135 mmol/L (ref 135–145)
Total Bilirubin: <0.2 mg/dL — ABNORMAL LOW (ref 0.3–1.2)
Total Protein: 6.9 g/dL (ref 6.5–8.1)

## 2019-03-30 NOTE — Telephone Encounter (Signed)
Received call from patient. She states she started having diarrhea this morning. Has had 4 episodes so far. She started taking imodium after the 1st watery diarrhea (2 tablets) and 1 after each loose stool since then without relief. She is asking for something stronger.  Also c/o increased back pain starting this am-difficult for her to stand up straight. No falls/injuries noted. Encouraged to appy heating pad on low to start with. Pt has lab appt this afternoon.  Please advise

## 2019-03-30 NOTE — Progress Notes (Signed)
Met w/ pt to introduce myself as her Arboriculturist.  Unfortunately there aren't any foundations offering copay assistance for her Dx and the type of ins she has.  I offered the [place], went over what it covers and gave her an expense sheet.  Pt doesn't know if she would like to apply yet so I gave her my card to contact me when she decides and for any questions or concerns she may have in the future.

## 2019-03-30 NOTE — Telephone Encounter (Signed)
TCT patient. Per [facility], [place] and Dr. Julien Nordmann. Pt is to continue Imodium 2 this poitn. Up to 8 tablets in 24 hours, maintain hydration and to notify us if the diarrhea does not subside. Encouraged increase fluid intake to prevent dehydration. For her back pain, continue with heating pad and tylenol/ibuprofen as needed. No additional pain meds at this time. 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 PT voiced understanding re: the above. She knows to call if diarrhea does not subside in the next day or two

## 2019-03-31 DIAGNOSIS — Z510 Encounter for antineoplastic radiation therapy: Secondary | ICD-10-CM | POA: Diagnosis not present

## 2019-04-01 ENCOUNTER — Telehealth: Payer: Self-pay | Admitting: Medical Oncology

## 2019-04-01 NOTE — Telephone Encounter (Addendum)
Med reconciliation -Percocet/Tramadol -prescribed by Dr Onnie Graham for her arm pain.   Back pain-Pt tried  percocet 1/2 tablet 3 times yesterday . It is not effective. She cannot take a full percocet because " I cannot function -I sleep all the time and have trouble thinking. [place]".  Tramadol is not effective.  Any recommendations?

## 2019-04-01 NOTE — Telephone Encounter (Signed)
Pain management f/u-Pt  took a whole percocet after eating today at 1230-she did not get any relief from this.  Ibuprofen or tylenol does not help.Heating pad does not help.She has had arthritis pain before and says it is not that kind of pain.   She thinks it is a disc issue. I recommended she contact Dr Rolena Infante.

## 2019-04-01 NOTE — Telephone Encounter (Addendum)
Bowel pattern change-pt stated she has not had a BM in 2 days since having diarrhea 3 days ago.

## 2019-04-01 NOTE — Telephone Encounter (Signed)
Mid to Low back pain worsening and going down legs associated with trouble walking in past 2-3 days."Percocet  has not touched the pain". Denies stumbling or falls , Bowels normal. Vomiting- daily for  2 days "because pain is so bad". 6/25 -xrt port and treat  6/29-f/u [facility]

## 2019-04-02 ENCOUNTER — Other Ambulatory Visit: Payer: Self-pay

## 2019-04-02 ENCOUNTER — Telehealth: Payer: Self-pay | Admitting: *Deleted

## 2019-04-02 ENCOUNTER — Ambulatory Visit
Admission: RE | Admit: 2019-04-02 | Discharge: 2019-04-02 | Disposition: A | Discharge status: Home / Self Care | Payer: Federal, State, Local not specified - PPO | Source: Ambulatory Visit | Attending: Radiation Oncology | Admitting: Radiation Oncology

## 2019-04-02 DIAGNOSIS — Z510 Encounter for antineoplastic radiation therapy: Secondary | ICD-10-CM | POA: Diagnosis not present

## 2019-04-02 NOTE — Telephone Encounter (Signed)
Patient called left message wanting to speak with Cassie Heilingoetter, PA.  She did not leave any detailed message of specific need.  Upon returning her call, family member stated that they just dropped her off at the cancer center for visit.  She has Port and Treat visit right now with Dr Lisbeth Renshaw.  Left message for Dr Ida Rogue nurse to advise if they could ask patient what her request was.

## 2019-04-03 ENCOUNTER — Ambulatory Visit
Admission: RE | Admit: 2019-04-03 | Discharge: 2019-04-03 | Disposition: A | Discharge status: Home / Self Care | Payer: Federal, State, Local not specified - PPO | Source: Ambulatory Visit | Attending: Radiation Oncology | Admitting: Radiation Oncology

## 2019-04-03 ENCOUNTER — Other Ambulatory Visit: Payer: Self-pay

## 2019-04-03 ENCOUNTER — Telehealth: Payer: Self-pay | Admitting: Physician Assistant

## 2019-04-03 ENCOUNTER — Other Ambulatory Visit: Payer: Self-pay | Admitting: Physician Assistant

## 2019-04-03 DIAGNOSIS — Z510 Encounter for antineoplastic radiation therapy: Secondary | ICD-10-CM | POA: Diagnosis not present

## 2019-04-03 DIAGNOSIS — C3411 Malignant neoplasm of upper lobe, right bronchus or lung: Secondary | ICD-10-CM

## 2019-04-03 MED ORDER — METHYLPREDNISOLONE 4 MG PO TBPK: ORAL_TABLET | ORAL | 0 refills | Status: DC

## 2019-04-03 NOTE — Telephone Encounter (Signed)
I followed up with the patient regarding her message sent yesterday. The patient has experienced this pain in her hip before and states it is relieved by a medrol dosepak. I sent a prescription to her pharmacy. I also discussed how to apply the EMLA cream for her up coming port-a-cath placement. All questions answered. We will see the patient for a follow-up visit before starting cycle #2 on Monday.

## 2019-04-06 ENCOUNTER — Encounter: Payer: Self-pay | Admitting: Internal Medicine

## 2019-04-06 ENCOUNTER — Inpatient Hospital Stay: Payer: Federal, State, Local not specified - PPO

## 2019-04-06 ENCOUNTER — Telehealth: Payer: Self-pay | Admitting: Internal Medicine

## 2019-04-06 ENCOUNTER — Ambulatory Visit
Admission: RE | Admit: 2019-04-06 | Discharge: 2019-04-06 | Disposition: A | Discharge status: Home / Self Care | Payer: Federal, State, Local not specified - PPO | Source: Ambulatory Visit | Attending: Radiation Oncology | Admitting: Radiation Oncology

## 2019-04-06 ENCOUNTER — Other Ambulatory Visit: Payer: Self-pay

## 2019-04-06 ENCOUNTER — Inpatient Hospital Stay (HOSPITAL_BASED_OUTPATIENT_CLINIC_OR_DEPARTMENT_OTHER): Payer: Federal, State, Local not specified - PPO | Admitting: Internal Medicine

## 2019-04-06 VITALS — BP 135/81 | HR 73 | Temp 98.7°F | Resp 18 | Ht 62.0 in | Wt 129.1 lb

## 2019-04-06 DIAGNOSIS — I6523 Occlusion and stenosis of bilateral carotid arteries: Secondary | ICD-10-CM

## 2019-04-06 DIAGNOSIS — M545 Low back pain: Secondary | ICD-10-CM

## 2019-04-06 DIAGNOSIS — C3411 Malignant neoplasm of upper lobe, right bronchus or lung: Secondary | ICD-10-CM

## 2019-04-06 DIAGNOSIS — Z79899 Other long term (current) drug therapy: Secondary | ICD-10-CM

## 2019-04-06 DIAGNOSIS — Z7689 Persons encountering health services in other specified circumstances: Secondary | ICD-10-CM

## 2019-04-06 DIAGNOSIS — Z5111 Encounter for antineoplastic chemotherapy: Secondary | ICD-10-CM | POA: Diagnosis not present

## 2019-04-06 DIAGNOSIS — Z1211 Encounter for screening for malignant neoplasm of colon: Secondary | ICD-10-CM

## 2019-04-06 DIAGNOSIS — F17210 Nicotine dependence, cigarettes, uncomplicated: Secondary | ICD-10-CM

## 2019-04-06 DIAGNOSIS — J849 Interstitial pulmonary disease, unspecified: Secondary | ICD-10-CM

## 2019-04-06 DIAGNOSIS — I10 Essential (primary) hypertension: Secondary | ICD-10-CM

## 2019-04-06 DIAGNOSIS — I2510 Atherosclerotic heart disease of native coronary artery without angina pectoris: Secondary | ICD-10-CM

## 2019-04-06 DIAGNOSIS — R040 Epistaxis: Secondary | ICD-10-CM

## 2019-04-06 DIAGNOSIS — R937 Abnormal findings on diagnostic imaging of other parts of musculoskeletal system: Secondary | ICD-10-CM

## 2019-04-06 DIAGNOSIS — K5900 Constipation, unspecified: Secondary | ICD-10-CM

## 2019-04-06 DIAGNOSIS — C7951 Secondary malignant neoplasm of bone: Secondary | ICD-10-CM | POA: Diagnosis not present

## 2019-04-06 DIAGNOSIS — R590 Localized enlarged lymph nodes: Secondary | ICD-10-CM

## 2019-04-06 DIAGNOSIS — Z5112 Encounter for antineoplastic immunotherapy: Secondary | ICD-10-CM

## 2019-04-06 DIAGNOSIS — Z510 Encounter for antineoplastic radiation therapy: Secondary | ICD-10-CM | POA: Diagnosis not present

## 2019-04-06 DIAGNOSIS — M84422D Pathological fracture, left humerus, subsequent encounter for fracture with routine healing: Secondary | ICD-10-CM

## 2019-04-06 LAB — CBC WITH DIFFERENTIAL (CANCER CENTER ONLY)
Abs Immature Granulocytes: 0.08 10*3/uL — ABNORMAL HIGH (ref 0.00–0.07)
Basophils Absolute: 0.1 10*3/uL (ref 0.0–0.1)
Basophils Relative: 1 %
Eosinophils Absolute: 0 10*3/uL (ref 0.0–0.5)
Eosinophils Relative: 0 %
HCT: 35.3 % — ABNORMAL LOW (ref 36.0–46.0)
Hemoglobin: 12.1 g/dL (ref 12.0–15.0)
Immature Granulocytes: 1 %
Lymphocytes Relative: 23 %
Lymphs Abs: 2.6 10*3/uL (ref 0.7–4.0)
MCH: 31 pg (ref 26.0–34.0)
MCHC: 34.3 g/dL (ref 30.0–36.0)
MCV: 90.5 fL (ref 80.0–100.0)
Monocytes Absolute: 1.3 10*3/uL — ABNORMAL HIGH (ref 0.1–1.0)
Monocytes Relative: 11 %
Neutro Abs: 7.6 10*3/uL (ref 1.7–7.7)
Neutrophils Relative %: 64 %
Platelet Count: 319 10*3/uL (ref 150–400)
RBC: 3.9 MIL/uL (ref 3.87–5.11)
RDW: 13.8 % (ref 11.5–15.5)
WBC Count: 11.6 10*3/uL — ABNORMAL HIGH (ref 4.0–10.5)
nRBC: 0 % (ref 0.0–0.2)

## 2019-04-06 LAB — CMP (CANCER CENTER ONLY)
ALT: 47 U/L — ABNORMAL HIGH (ref 0–44)
AST: 32 U/L (ref 15–41)
Albumin: 3.6 g/dL (ref 3.5–5.0)
Alkaline Phosphatase: 104 U/L (ref 38–126)
Anion gap: 9 (ref 5–15)
BUN: 21 mg/dL (ref 8–23)
CO2: 23 mmol/L (ref 22–32)
Calcium: 8.9 mg/dL (ref 8.9–10.3)
Chloride: 102 mmol/L (ref 98–111)
Creatinine: 0.74 mg/dL (ref 0.44–1.00)
GFR, Est AFR Am: >60 mL/min (ref >60)
GFR, Estimated: >60 mL/min (ref >60)
Glucose, Bld: 121 mg/dL — ABNORMAL HIGH (ref 70–99)
Potassium: 4.1 mmol/L (ref 3.5–5.1)
Sodium: 134 mmol/L — ABNORMAL LOW (ref 135–145)
Total Bilirubin: 0.2 mg/dL — ABNORMAL LOW (ref 0.3–1.2)
Total Protein: 7.2 g/dL (ref 6.5–8.1)

## 2019-04-06 LAB — TSH: TSH: 1.603 u[IU]/mL (ref 0.308–3.960)

## 2019-04-06 MED ORDER — SODIUM CHLORIDE 0.9 % IV SOLN
1500.0000 mg | Freq: Once | INTRAVENOUS | Status: AC
  Administered 2019-04-06: 1500 mg via INTRAVENOUS
  Filled 2019-04-06: qty 30

## 2019-04-06 MED ORDER — SODIUM CHLORIDE 0.9 % IV SOLN
Freq: Once | INTRAVENOUS | Status: AC
  Administered 2019-04-06: 12:00:00 via INTRAVENOUS
  Filled 2019-04-06: qty 5

## 2019-04-06 MED ORDER — PALONOSETRON HCL INJECTION 0.25 MG/5ML
INTRAVENOUS | Status: AC
  Filled 2019-04-06: qty 5

## 2019-04-06 MED ORDER — PALONOSETRON HCL INJECTION 0.25 MG/5ML
0.2500 mg | Freq: Once | INTRAVENOUS | Status: AC
  Administered 2019-04-06: 0.25 mg via INTRAVENOUS

## 2019-04-06 MED ORDER — SODIUM CHLORIDE 0.9 % IV SOLN
100.0000 mg/m2 | Freq: Once | INTRAVENOUS | Status: AC
  Administered 2019-04-06: 160 mg via INTRAVENOUS
  Filled 2019-04-06: qty 8

## 2019-04-06 MED ORDER — SODIUM CHLORIDE 0.9 % IV SOLN
Freq: Once | INTRAVENOUS | Status: AC
  Administered 2019-04-06: 11:00:00 via INTRAVENOUS
  Filled 2019-04-06: qty 250

## 2019-04-06 MED ORDER — SODIUM CHLORIDE 0.9 % IV SOLN
350.0000 mg | Freq: Once | INTRAVENOUS | Status: AC
  Administered 2019-04-06: 350 mg via INTRAVENOUS
  Filled 2019-04-06: qty 35

## 2019-04-06 NOTE — Progress Notes (Signed)
Patient is a fall risk and refuses nurse assistance in the bathroom.

## 2019-04-06 NOTE — Progress Notes (Signed)
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  PRIOR THERAPY: None  CURRENT THERAPY: Palliative systemic chemotherapy with carboplatin for AUC of 5 on day 1, etoposide 100 mg/M2 on days 1, 2 and 3 as well as Imfinzi 1500 mg every 3 weeks with the chemotherapy and Neulasta support. First dose on 03/16/2019. Status post 1 cycle.   INTERVAL HISTORY: Brittany Marks 78 y.o. female returns to the clinic today for follow-up visit.  The patient tolerated the first cycle of her treatment fairly well with no concerning adverse effects.  She had low back pain secondary to osteoarthritis and the patient received a Medrol Dosepak recently with significant improvement in her condition.  She denied having any fever or chills.  She has no nausea, vomiting, diarrhea or constipation.  She has no headache or visual changes.  She has no chest pain, shortness of breath, cough or hemoptysis.  She is here today for evaluation before starting cycle #2 of her treatment.  MEDICAL HISTORY: Past Medical History:  Diagnosis Date  . Cancer ([place])   . Cataract    Bilateral  . Constipation    pain medication  . Left rotator cuff tear   . Right rotator cuff tear     ALLERGIES:  is allergic to diclofenac.  MEDICATIONS:  Current Outpatient Medications  Medication Sig Dispense Refill  . b complex vitamins tablet Take 1 tablet by mouth daily.    Marland Kitchen B-Complex TABS Take by mouth.    . COD LIVER OIL PO Take 1 tablet by mouth daily.     Marland Kitchen lidocaine-prilocaine (EMLA) cream Apply 1 application topically as needed. 30 g 0  .  methylPREDNISolone (MEDROL DOSEPAK) 4 MG TBPK tablet Use as instructed 21 tablet 0  . Multiple Vitamin (MULTIVITAMIN) tablet Take 1 tablet by mouth daily.    . nicotine (NICODERM CQ) 21 mg/24hr patch Place 1 patch (21 mg total) onto the skin daily. 28 patch 0  . oxyCODONE-acetaminophen (PERCOCET/ROXICET) 5-325 MG tablet Take 1 tablet by mouth every 6 (six) hours as needed.    Marland Kitchen oxyCODONE-acetaminophen (PERCOCET/ROXICET) 5-325 MG tablet Take 1 tablet by mouth.    Marland Kitchen oxymetazoline (AFRIN) 0.05 % nasal spray Place 2 sprays into the nose 2 (two) times daily as needed for congestion.    Vladimir Faster Glycol-Propyl Glycol (SYSTANE) 0.4-0.3 % SOLN Apply 1 drop to eye 2 (two) times daily as needed (dry eyes).    . prochlorperazine (COMPAZINE) 10 MG tablet Take 1 tablet (10 mg total) by mouth every 6 (six) hours as needed for nausea or vomiting. 30 tablet 0  . RA Cod Liver Oil OIL Take by mouth.    . sodium chloride (OCEAN) 0.65 % nasal spray Place 1 spray into the nose 2 (two) times daily as needed for congestion.    . traMADol (ULTRAM) 50 MG tablet TAKE 1 TABLET BY MOUTH EVERY 6 TO 8 HOURS AS NEEDED FOR PAIN    . vitamin C (ASCORBIC ACID) 500 MG tablet Take 500 mg by mouth daily.    . vitamin E 400 UNIT capsule Take 400 Units by mouth daily.  No current facility-administered medications for this visit.     SURGICAL HISTORY:  Past Surgical History:  Procedure Laterality Date  . CATARACT EXTRACTION W/ INTRAOCULAR LENS  IMPLANT, BILATERAL  2012  . DECOMPRESSIVE LUMBAR LAMINECTOMY LEVEL 2 N/A 07/29/2013   Procedure: LUMBAR LAMINECTOMY, DECOMPRESSION LUMBAR THREE TO FOUR, FOUR TO FIVE microdiscectomy l3,4 right;  Surgeon: Tobi Bastos, MD;  Location: [facility];  Service: Orthopedics;  Laterality: N/A;  . I & D SUPERIOR RIGHT SHOULDER AND CLOSURE WOUND  01-10-2011   S/P ROTATOR CUFF REPAIR  . LUMBAR LAMINECTOMY  1970'S  . LUMBAR LAMINECTOMY/DECOMPRESSION MICRODISCECTOMY N/A 06/27/2016   Procedure: L1 -  L2 DISCECTOMY;  Surgeon: Melina Schools, MD;  Location: [facility];  Service: Orthopedics;  Laterality: N/A;  . [place]  . RIGHT SHOULDER ARTHROSCOPY/ OPEN DISTAL CLAVICLE RESECTION/ SAD/ OPEN ROTATOR CUFF REPAIR  11-28-2010  . SHOULDER OPEN ROTATOR CUFF REPAIR  12/19/2011   Procedure: ROTATOR CUFF REPAIR SHOULDER OPEN;  Surgeon: Magnus Sinning, MD;  Location: [facility];  Service: Orthopedics;  Laterality: Right;  RIGHT RECURRENT OPEN REPAIR OF THE ROTATOR CUFF WITH TISSUE MEND GRAFTANTERIOR CHROMIOECTOMY  . VAGINAL HYSTERECTOMY  1979    REVIEW OF SYSTEMS:  A comprehensive review of systems was negative except for: Musculoskeletal: positive for back pain   PHYSICAL EXAMINATION: General appearance: alert, cooperative and no distress Head: Normocephalic, without obvious abnormality, atraumatic Neck: no adenopathy, no JVD, supple, symmetrical, trachea midline and thyroid not enlarged, symmetric, no tenderness/mass/nodules Lymph nodes: Cervical, supraclavicular, and axillary nodes normal. Resp: clear to auscultation bilaterally Back: symmetric, no curvature. ROM normal. No CVA tenderness. Cardio: regular rate and rhythm, S1, S2 normal, no murmur, click, rub or gallop GI: soft, non-tender; bowel sounds normal; no masses,  no organomegaly Extremities: extremities normal, atraumatic, no cyanosis or edema  ECOG PERFORMANCE STATUS: 1 - Symptomatic but completely ambulatory  Blood pressure 135/81, pulse 73, temperature 98.7 F (37.1 C), temperature source Oral, resp. rate 18, height 5\' 2"  (1.575 m), weight 129 lb 1.6 oz (58.6 kg), SpO2 97 %.  LABORATORY DATA: Lab Results  Component Value Date   WBC 11.6 (H) 04/06/2019   HGB 12.1 04/06/2019   HCT 35.3 (L) 04/06/2019   MCV 90.5 04/06/2019   PLT 319 04/06/2019      Chemistry      Component Value Date/Time   NA 134 (L) 04/06/2019 0944   K 4.1 04/06/2019 0944   CL 102 04/06/2019 0944   CO2 23 04/06/2019 0944    BUN 21 04/06/2019 0944   CREATININE 0.74 04/06/2019 0944      Component Value Date/Time   CALCIUM 8.9 04/06/2019 0944   ALKPHOS 104 04/06/2019 0944   AST 32 04/06/2019 0944   ALT 47 (H) 04/06/2019 0944   BILITOT 0.2 (L) 04/06/2019 0944       RADIOGRAPHIC STUDIES: Mr Jeri Cos ZJ Contrast  Result Date: 03/19/2019 CLINICAL DATA:  Small cell lung cancer staging EXAM: MRI HEAD WITHOUT AND WITH CONTRAST TECHNIQUE: Multiplanar, multiecho pulse sequences of the brain and surrounding structures were obtained without and with intravenous contrast. CONTRAST:  6 mL Gadavist COMPARISON:  None. FINDINGS: BRAIN: There is no acute infarct, acute hemorrhage or extra-axial collection. The midline structures are normal. No midline shift or other mass effect. Early confluent hyperintense T2-weighted signal of the periventricular and deep white matter, most commonly due to chronic ischemic microangiopathy. Generalized atrophy without lobar predilection. Susceptibility-sensitive sequences show no chronic microhemorrhage or superficial  siderosis. No abnormal contrast enhancement. VASCULAR: The major intracranial arterial and venous sinus flow voids are normal. SKULL AND UPPER CERVICAL SPINE: Calvarial bone marrow signal is normal. There is no skull base mass. Visualized upper cervical spine and soft tissues are normal. SINUSES/ORBITS: No fluid levels or advanced mucosal thickening. No mastoid or middle ear effusion. The orbits are normal. IMPRESSION: 1. No intracranial metastatic disease. 2. Chronic small vessel ischemia and generalized volume loss. Electronically Signed   By: Ulyses Jarred M.D.   On: 03/19/2019 22:09    ASSESSMENT AND PLAN: This is a very pleasant 78 years old white female with extensive stage small cell lung cancer and she is currently undergoing treatment with carboplatin, etoposide and Imfinzi status post 1 cycle.  She tolerated the first cycle of her treatment well with no concerning adverse  effects. I recommended for her to proceed with cycle #2 today as planned. I will see her back for follow-up visit in 3 weeks for evaluation after repeating CT scan of the chest, abdomen and pelvis for restaging of her disease. The patient was advised to call immediately if she has any concerning symptoms in the interval. The patient voices understanding of current disease status and treatment options and is in agreement with the current care plan.  All questions were answered. The patient knows to call the clinic with any problems, questions or concerns. We can certainly see the patient much sooner if necessary.  I spent 10 minutes counseling the patient face to face. The total time spent in the appointment was 15 minutes.  Disclaimer: This note was dictated with voice recognition software. Similar sounding words can inadvertently be transcribed and may not be corrected upon review.

## 2019-04-06 NOTE — Telephone Encounter (Signed)
Scheduled appt per 6/29 los - pt to get an updated schedule next visit.

## 2019-04-06 NOTE — Patient Instructions (Signed)
[place] Discharge Instructions for Patients Receiving Chemotherapy  Today you received the following chemotherapy agents: Imfinzi, Carboplatin, Etoposide   To help prevent nausea and vomiting after your treatment, we encourage you to take your nausea medication as directed.   If you develop nausea and vomiting that is not controlled by your nausea medication, call the clinic.   BELOW ARE SYMPTOMS THAT SHOULD BE REPORTED IMMEDIATELY:  *FEVER GREATER THAN 100.5 F  *CHILLS WITH OR WITHOUT FEVER  NAUSEA AND VOMITING THAT IS NOT CONTROLLED WITH YOUR NAUSEA MEDICATION  *UNUSUAL SHORTNESS OF BREATH  *UNUSUAL BRUISING OR BLEEDING  TENDERNESS IN MOUTH AND THROAT WITH OR WITHOUT PRESENCE OF ULCERS  *URINARY PROBLEMS  *BOWEL PROBLEMS  UNUSUAL RASH Items with * indicate a potential emergency and should be followed up as soon as possible.  Feel free to call the clinic should you have any questions or concerns. The clinic phone number is [phone].  Please show the [place] at check-in to the Emergency Department and triage nurse.  Durvalumab injection What is this medicine? DURVALUMAB (dur VAL ue mab) is a monoclonal antibody. It is used to treat urothelial cancer and lung cancer. This medicine may be used for other purposes; ask your health care provider or pharmacist if you have questions. COMMON BRAND NAME(S): IMFINZI What should I tell my health care provider before I take this medicine? They need to know if you have any of these conditions: -diabetes -immune system problems -infection -inflammatory bowel disease -kidney disease -liver disease -lung or breathing disease -lupus -organ transplant -stomach or intestine problems -thyroid disease -an unusual or allergic reaction to durvalumab, other medicines, foods, dyes, or preservatives -pregnant or trying to get pregnant -breast-feeding How should I use this medicine? This medicine is for  infusion into a vein. It is given by a health care professional in a hospital or clinic setting. A special MedGuide will be given to you before each treatment. Be sure to read this information carefully each time. Talk to your pediatrician regarding the use of this medicine in children. Special care may be needed. Overdosage: If you think you have taken too much of this medicine contact a poison control center or emergency room at once. NOTE: This medicine is only for you. Do not share this medicine with others. What if I miss a dose? It is important not to miss your dose. Call your doctor or health care professional if you are unable to keep an appointment. What may interact with this medicine? Interactions have not been studied. This list may not describe all possible interactions. Give your health care provider a list of all the medicines, herbs, non-prescription drugs, or dietary supplements you use. Also tell them if you smoke, drink alcohol, or use illegal drugs. Some items may interact with your medicine. What should I watch for while using this medicine? This drug may make you feel generally unwell. Continue your course of treatment even though you feel ill unless your doctor tells you to stop. You may need blood work done while you are taking this medicine. Do not become pregnant while taking this medicine or for 3 months after stopping it. Women should inform their doctor if they wish to become pregnant or think they might be pregnant. There is a potential for serious side effects to an unborn child. Talk to your health care professional or pharmacist for more information. Do not breast-feed an infant while taking this medicine or for 3 months after  stopping it. What side effects may I notice from receiving this medicine? Side effects that you should report to your doctor or health care professional as soon as possible: -allergic reactions like skin rash, itching or hives, swelling of the  face, lips, or tongue -black, tarry stools -bloody or watery diarrhea -breathing problems -change in emotions or moods -change in sex drive -changes in vision -chest pain or chest tightness -chills -confusion -cough -facial flushing -fever -headache -signs and symptoms of high blood sugar such as dizziness; dry mouth; dry skin; fruity breath; nausea; stomach pain; increased hunger or thirst; increased urination -signs and symptoms of liver injury like dark yellow or brown urine; general ill feeling or flu-like symptoms; light-colored stools; loss of appetite; nausea; right upper belly pain; unusually weak or tired; yellowing of the eyes or skin -stomach pain -trouble passing urine or change in the amount of urine -weight gain or weight loss Side effects that usually do not require medical attention (report these to your doctor or health care professional if they continue or are bothersome): -bone pain -constipation -loss of appetite -muscle pain -nausea -swelling of the ankles, feet, hands -tiredness This list may not describe all possible side effects. Call your doctor for medical advice about side effects. You may report side effects to FDA at 1-800-FDA-1088. Where should I keep my medicine? This drug is given in a hospital or clinic and will not be stored at home. NOTE: This sheet is a summary. It may not cover all possible information. If you have questions about this medicine, talk to your doctor, pharmacist, or health care provider.  2019 Elsevier/Gold Standard (2016-12-04 19:25:04)  Carboplatin injection What is this medicine? CARBOPLATIN (KAR boe pla tin) is a chemotherapy drug. It targets fast dividing cells, like cancer cells, and causes these cells to die. This medicine is used to treat ovarian cancer and many other cancers. This medicine may be used for other purposes; ask your health care provider or pharmacist if you have questions. COMMON BRAND NAME(S):  Paraplatin What should I tell my health care provider before I take this medicine? They need to know if you have any of these conditions: -blood disorders -hearing problems -kidney disease -recent or ongoing radiation therapy -an unusual or allergic reaction to carboplatin, cisplatin, other chemotherapy, other medicines, foods, dyes, or preservatives -pregnant or trying to get pregnant -breast-feeding How should I use this medicine? This drug is usually given as an infusion into a vein. It is administered in a hospital or clinic by a specially trained health care professional. Talk to your pediatrician regarding the use of this medicine in children. Special care may be needed. Overdosage: If you think you have taken too much of this medicine contact a poison control center or emergency room at once. NOTE: This medicine is only for you. Do not share this medicine with others. What if I miss a dose? It is important not to miss a dose. Call your doctor or health care professional if you are unable to keep an appointment. What may interact with this medicine? -medicines for seizures -medicines to increase blood counts like filgrastim, pegfilgrastim, sargramostim -some antibiotics like amikacin, gentamicin, neomycin, streptomycin, tobramycin -vaccines Talk to your doctor or health care professional before taking any of these medicines: -acetaminophen -aspirin -ibuprofen -ketoprofen -naproxen This list may not describe all possible interactions. Give your health care provider a list of all the medicines, herbs, non-prescription drugs, or dietary supplements you use. Also tell them if you smoke, drink  alcohol, or use illegal drugs. Some items may interact with your medicine. What should I watch for while using this medicine? Your condition will be monitored carefully while you are receiving this medicine. You will need important blood work done while you are taking this medicine. This drug  may make you feel generally unwell. This is not uncommon, as chemotherapy can affect healthy cells as well as cancer cells. Report any side effects. Continue your course of treatment even though you feel ill unless your doctor tells you to stop. In some cases, you may be given additional medicines to help with side effects. Follow all directions for their use. Call your doctor or health care professional for advice if you get a fever, chills or sore throat, or other symptoms of a cold or flu. Do not treat yourself. This drug decreases your body's ability to fight infections. Try to avoid being around people who are sick. This medicine may increase your risk to bruise or bleed. Call your doctor or health care professional if you notice any unusual bleeding. Be careful brushing and flossing your teeth or using a toothpick because you may get an infection or bleed more easily. If you have any dental work done, tell your dentist you are receiving this medicine. Avoid taking products that contain aspirin, acetaminophen, ibuprofen, naproxen, or ketoprofen unless instructed by your doctor. These medicines may hide a fever. Do not become pregnant while taking this medicine. Women should inform their doctor if they wish to become pregnant or think they might be pregnant. There is a potential for serious side effects to an unborn child. Talk to your health care professional or pharmacist for more information. Do not breast-feed an infant while taking this medicine. What side effects may I notice from receiving this medicine? Side effects that you should report to your doctor or health care professional as soon as possible: -allergic reactions like skin rash, itching or hives, swelling of the face, lips, or tongue -signs of infection - fever or chills, cough, sore throat, pain or difficulty passing urine -signs of decreased platelets or bleeding - bruising, pinpoint red spots on the skin, black, tarry stools,  nosebleeds -signs of decreased red blood cells - unusually weak or tired, fainting spells, lightheadedness -breathing problems -changes in hearing -changes in vision -chest pain -high blood pressure -low blood counts - This drug may decrease the number of white blood cells, red blood cells and platelets. You may be at increased risk for infections and bleeding. -nausea and vomiting -pain, swelling, redness or irritation at the injection site -pain, tingling, numbness in the hands or feet -problems with balance, talking, walking -trouble passing urine or change in the amount of urine Side effects that usually do not require medical attention (report to your doctor or health care professional if they continue or are bothersome): -hair loss -loss of appetite -metallic taste in the mouth or changes in taste This list may not describe all possible side effects. Call your doctor for medical advice about side effects. You may report side effects to FDA at 1-800-FDA-1088. Where should I keep my medicine? This drug is given in a hospital or clinic and will not be stored at home. NOTE: This sheet is a summary. It may not cover all possible information. If you have questions about this medicine, talk to your doctor, pharmacist, or health care provider.  2019 Elsevier/Gold Standard (2007-12-30 14:38:05)  Etoposide, VP-16 injection What is this medicine? ETOPOSIDE, VP-16 (e toe POE side) is  a chemotherapy drug. It is used to treat testicular cancer, lung cancer, and other cancers. This medicine may be used for other purposes; ask your health care provider or pharmacist if you have questions. COMMON BRAND NAME(S): Etopophos, Toposar, VePesid What should I tell my health care provider before I take this medicine? They need to know if you have any of these conditions: -infection -kidney disease -liver disease -low blood counts, like low white cell, platelet, or red cell counts -an unusual or  allergic reaction to etoposide, other medicines, foods, dyes, or preservatives -pregnant or trying to get pregnant -breast-feeding How should I use this medicine? This medicine is for infusion into a vein. It is administered in a hospital or clinic by a specially trained health care professional. Talk to your pediatrician regarding the use of this medicine in children. Special care may be needed. Overdosage: If you think you have taken too much of this medicine contact a poison control center or emergency room at once. NOTE: This medicine is only for you. Do not share this medicine with others. What if I miss a dose? It is important not to miss your dose. Call your doctor or health care professional if you are unable to keep an appointment. What may interact with this medicine? -aspirin -certain medications for seizures like carbamazepine, phenobarbital, phenytoin, valproic acid -cyclosporine -levamisole -warfarin This list may not describe all possible interactions. Give your health care provider a list of all the medicines, herbs, non-prescription drugs, or dietary supplements you use. Also tell them if you smoke, drink alcohol, or use illegal drugs. Some items may interact with your medicine. What should I watch for while using this medicine? Visit your doctor for checks on your progress. This drug may make you feel generally unwell. This is not uncommon, as chemotherapy can affect healthy cells as well as cancer cells. Report any side effects. Continue your course of treatment even though you feel ill unless your doctor tells you to stop. In some cases, you may be given additional medicines to help with side effects. Follow all directions for their use. Call your doctor or health care professional for advice if you get a fever, chills or sore throat, or other symptoms of a cold or flu. Do not treat yourself. This drug decreases your body's ability to fight infections. Try to avoid being  around people who are sick. This medicine may increase your risk to bruise or bleed. Call your doctor or health care professional if you notice any unusual bleeding. Talk to your doctor about your risk of cancer. You may be more at risk for certain types of cancers if you take this medicine. Do not become pregnant while taking this medicine or for at least 6 months after stopping it. Women should inform their doctor if they wish to become pregnant or think they might be pregnant. Women of child-bearing potential will need to have a negative pregnancy test before starting this medicine. There is a potential for serious side effects to an unborn child. Talk to your health care professional or pharmacist for more information. Do not breast-feed an infant while taking this medicine. Men must use a latex condom during sexual contact with a woman while taking this medicine and for at least 4 months after stopping it. A latex condom is needed even if you have had a vasectomy. Contact your doctor right away if your partner becomes pregnant. Do not donate sperm while taking this medicine and for at least 4 months  after you stop taking this medicine. Men should inform their doctors if they wish to father a child. This medicine may lower sperm counts. What side effects may I notice from receiving this medicine? Side effects that you should report to your doctor or health care professional as soon as possible: -allergic reactions like skin rash, itching or hives, swelling of the face, lips, or tongue -low blood counts - this medicine may decrease the number of white blood cells, red blood cells and platelets. You may be at increased risk for infections and bleeding. -signs of infection - fever or chills, cough, sore throat, pain or difficulty passing urine -signs of decreased platelets or bleeding - bruising, pinpoint red spots on the skin, black, tarry stools, blood in the urine -signs of decreased red blood cells -  unusually weak or tired, fainting spells, lightheadedness -breathing problems -changes in vision -mouth or throat sores or ulcers -pain, redness, swelling or irritation at the injection site -pain, tingling, numbness in the hands or feet -redness, blistering, peeling or loosening of the skin, including inside the mouth -seizures -vomiting Side effects that usually do not require medical attention (report to your doctor or health care professional if they continue or are bothersome): -diarrhea -hair loss -loss of appetite -nausea -stomach pain This list may not describe all possible side effects. Call your doctor for medical advice about side effects. You may report side effects to FDA at 1-800-FDA-1088. Where should I keep my medicine? This drug is given in a hospital or clinic and will not be stored at home. NOTE: This sheet is a summary. It may not cover all possible information. If you have questions about this medicine, talk to your doctor, pharmacist, or health care provider.  2019 Elsevier/Gold Standard (2015-09-16 11:53:23)

## 2019-04-07 ENCOUNTER — Other Ambulatory Visit: Payer: Self-pay | Admitting: Radiology

## 2019-04-07 ENCOUNTER — Other Ambulatory Visit: Payer: Self-pay

## 2019-04-07 ENCOUNTER — Inpatient Hospital Stay: Payer: Federal, State, Local not specified - PPO

## 2019-04-07 ENCOUNTER — Ambulatory Visit
Admission: RE | Admit: 2019-04-07 | Discharge: 2019-04-07 | Disposition: A | Discharge status: Home / Self Care | Payer: Federal, State, Local not specified - PPO | Source: Ambulatory Visit | Attending: Radiation Oncology | Admitting: Radiation Oncology

## 2019-04-07 VITALS — BP 153/68 | HR 72 | Temp 99.1°F | Resp 17

## 2019-04-07 DIAGNOSIS — Z510 Encounter for antineoplastic radiation therapy: Secondary | ICD-10-CM | POA: Diagnosis not present

## 2019-04-07 DIAGNOSIS — C3411 Malignant neoplasm of upper lobe, right bronchus or lung: Secondary | ICD-10-CM | POA: Diagnosis not present

## 2019-04-07 MED ORDER — DEXAMETHASONE SODIUM PHOSPHATE 10 MG/ML IJ SOLN
10.0000 mg | Freq: Once | INTRAMUSCULAR | Status: AC
  Administered 2019-04-07: 10 mg via INTRAVENOUS

## 2019-04-07 MED ORDER — SODIUM CHLORIDE 0.9 % IV SOLN
Freq: Once | INTRAVENOUS | Status: AC
  Administered 2019-04-07: 16:00:00 via INTRAVENOUS
  Filled 2019-04-07: qty 250

## 2019-04-07 MED ORDER — SODIUM CHLORIDE 0.9 % IV SOLN
100.0000 mg/m2 | Freq: Once | INTRAVENOUS | Status: AC
  Administered 2019-04-07: 160 mg via INTRAVENOUS
  Filled 2019-04-07: qty 8

## 2019-04-07 MED ORDER — DEXAMETHASONE SODIUM PHOSPHATE 10 MG/ML IJ SOLN
INTRAMUSCULAR | Status: AC
  Filled 2019-04-07: qty 1

## 2019-04-07 NOTE — Patient Instructions (Signed)
[place] Discharge Instructions for Patients Receiving Chemotherapy  Today you received the following chemotherapy agents Etoposide  To help prevent nausea and vomiting after your treatment, we encourage you to take your nausea medication as directed.   If you develop nausea and vomiting that is not controlled by your nausea medication, call the clinic.   BELOW ARE SYMPTOMS THAT SHOULD BE REPORTED IMMEDIATELY:  *FEVER GREATER THAN 100.5 F  *CHILLS WITH OR WITHOUT FEVER  NAUSEA AND VOMITING THAT IS NOT CONTROLLED WITH YOUR NAUSEA MEDICATION  *UNUSUAL SHORTNESS OF BREATH  *UNUSUAL BRUISING OR BLEEDING  TENDERNESS IN MOUTH AND THROAT WITH OR WITHOUT PRESENCE OF ULCERS  *URINARY PROBLEMS  *BOWEL PROBLEMS  UNUSUAL RASH Items with * indicate a potential emergency and should be followed up as soon as possible.  Feel free to call the clinic should you have any questions or concerns. The clinic phone number is [phone].  Please show the [place] at check-in to the Emergency Department and triage nurse.

## 2019-04-08 ENCOUNTER — Inpatient Hospital Stay: Payer: Federal, State, Local not specified - PPO | Attending: Oncology

## 2019-04-08 ENCOUNTER — Ambulatory Visit
Admission: RE | Admit: 2019-04-08 | Discharge: 2019-04-08 | Disposition: A | Discharge status: Home / Self Care | Payer: Federal, State, Local not specified - PPO | Source: Ambulatory Visit | Attending: Radiation Oncology | Admitting: Radiation Oncology

## 2019-04-08 ENCOUNTER — Other Ambulatory Visit: Payer: Self-pay

## 2019-04-08 VITALS — BP 156/65 | HR 72 | Temp 100.0°F | Resp 17

## 2019-04-08 DIAGNOSIS — J439 Emphysema, unspecified: Secondary | ICD-10-CM | POA: Diagnosis not present

## 2019-04-08 DIAGNOSIS — Z5112 Encounter for antineoplastic immunotherapy: Secondary | ICD-10-CM | POA: Diagnosis not present

## 2019-04-08 DIAGNOSIS — C3411 Malignant neoplasm of upper lobe, right bronchus or lung: Secondary | ICD-10-CM | POA: Diagnosis present

## 2019-04-08 DIAGNOSIS — Z5111 Encounter for antineoplastic chemotherapy: Secondary | ICD-10-CM | POA: Diagnosis not present

## 2019-04-08 DIAGNOSIS — I700 Atherosclerosis of aorta: Secondary | ICD-10-CM | POA: Insufficient documentation

## 2019-04-08 DIAGNOSIS — C778 Secondary and unspecified malignant neoplasm of lymph nodes of multiple regions: Secondary | ICD-10-CM | POA: Diagnosis not present

## 2019-04-08 DIAGNOSIS — C7951 Secondary malignant neoplasm of bone: Secondary | ICD-10-CM | POA: Insufficient documentation

## 2019-04-08 DIAGNOSIS — Z79899 Other long term (current) drug therapy: Secondary | ICD-10-CM | POA: Insufficient documentation

## 2019-04-08 DIAGNOSIS — Z9221 Personal history of antineoplastic chemotherapy: Secondary | ICD-10-CM | POA: Insufficient documentation

## 2019-04-08 DIAGNOSIS — K5900 Constipation, unspecified: Secondary | ICD-10-CM | POA: Diagnosis not present

## 2019-04-08 DIAGNOSIS — Z510 Encounter for antineoplastic radiation therapy: Secondary | ICD-10-CM | POA: Insufficient documentation

## 2019-04-08 DIAGNOSIS — R21 Rash and other nonspecific skin eruption: Secondary | ICD-10-CM | POA: Diagnosis not present

## 2019-04-08 MED ORDER — DEXAMETHASONE SODIUM PHOSPHATE 10 MG/ML IJ SOLN
10.0000 mg | Freq: Once | INTRAMUSCULAR | Status: AC
  Administered 2019-04-08: 10 mg via INTRAVENOUS

## 2019-04-08 MED ORDER — SODIUM CHLORIDE 0.9 % IV SOLN
100.0000 mg/m2 | Freq: Once | INTRAVENOUS | Status: AC
  Administered 2019-04-08: 160 mg via INTRAVENOUS
  Filled 2019-04-08: qty 8

## 2019-04-08 MED ORDER — DEXAMETHASONE SODIUM PHOSPHATE 10 MG/ML IJ SOLN
INTRAMUSCULAR | Status: AC
  Filled 2019-04-08: qty 1

## 2019-04-08 MED ORDER — SODIUM CHLORIDE 0.9 % IV SOLN
Freq: Once | INTRAVENOUS | Status: AC
  Administered 2019-04-08: 15:00:00 via INTRAVENOUS
  Filled 2019-04-08: qty 250

## 2019-04-08 NOTE — Patient Instructions (Signed)
[place] Discharge Instructions for Patients Receiving Chemotherapy  Today you received the following chemotherapy agents Etoposide (VEPESID).  To help prevent nausea and vomiting after your treatment, we encourage you to take your nausea medication as prescribed.  If you develop nausea and vomiting that is not controlled by your nausea medication, call the clinic.   BELOW ARE SYMPTOMS THAT SHOULD BE REPORTED IMMEDIATELY:  *FEVER GREATER THAN 100.5 F  *CHILLS WITH OR WITHOUT FEVER  NAUSEA AND VOMITING THAT IS NOT CONTROLLED WITH YOUR NAUSEA MEDICATION  *UNUSUAL SHORTNESS OF BREATH  *UNUSUAL BRUISING OR BLEEDING  TENDERNESS IN MOUTH AND THROAT WITH OR WITHOUT PRESENCE OF ULCERS  *URINARY PROBLEMS  *BOWEL PROBLEMS  UNUSUAL RASH Items with * indicate a potential emergency and should be followed up as soon as possible.  Feel free to call the clinic should you have any questions or concerns. The clinic phone number is [phone].  Please show the [facility] at check-in to the Emergency Department and triage nurse.  Coronavirus (COVID-19) Are you at risk?  Are you at risk for the Coronavirus (COVID-19)?  To be considered HIGH RISK for Coronavirus (COVID-19), you have to meet the following criteria:  . Traveled to [place], [place], [place], [place] or [place]; or in the [place] to [place], [place], [place], or [place]; and have fever, cough, and shortness of breath within the last 2 weeks of travel OR . Been in close contact with a person diagnosed with COVID-19 within the last 2 weeks and have fever, cough, and shortness of breath . IF YOU DO NOT MEET THESE CRITERIA, YOU ARE CONSIDERED LOW RISK FOR COVID-19.  What to do if you are HIGH RISK for COVID-19?  Marland Kitchen If you are having a medical emergency, call 911. . Seek medical care right away. Before you go to a doctor's office, urgent care or emergency department, call ahead and tell them  about your recent travel, contact with someone diagnosed with COVID-19, and your symptoms. You should receive instructions from your physician's office regarding next steps of care.  . When you arrive at healthcare provider, tell the healthcare staff immediately you have returned from visiting [place], [place], [place], [place] or [place]; or traveled in the [place] to [place], [place], [place], or [place]; in the last two weeks or you have been in close contact with a person diagnosed with COVID-19 in the last 2 weeks.   . Tell the health care staff about your symptoms: fever, cough and shortness of breath. . After you have been seen by a medical provider, you will be either: o Tested for (COVID-19) and discharged home on quarantine except to seek medical care if symptoms worsen, and asked to  - Stay home and avoid contact with others until you get your results (4-5 days)  - Avoid travel on public transportation if possible (such as bus, train, or airplane) or o Sent to the Emergency Department by EMS for evaluation, COVID-19 testing, and possible admission depending on your condition and test results.  What to do if you are LOW RISK for COVID-19?  Reduce your risk of any infection by using the same precautions used for avoiding the common cold or flu:  Marland Kitchen Wash your hands often with soap and warm water for at least 20 seconds.  If soap and water are not readily available, use an alcohol-based hand sanitizer with at least 60% alcohol.  . If coughing or sneezing,  cover your mouth and nose by coughing or sneezing into the elbow areas of your shirt or coat, into a tissue or into your sleeve (not your hands). . Avoid shaking hands with others and consider head nods or verbal greetings only. . Avoid touching your eyes, nose, or mouth with unwashed hands.  . Avoid close contact with people who are sick. . Avoid places or events with large numbers of people in one location, like concerts or  sporting events. . Carefully consider travel plans you have or are making. . If you are planning any travel outside or inside the [place], visit the CDC's Travelers' Health webpage for the latest health notices. . If you have some symptoms but not all symptoms, continue to monitor at home and seek medical attention if your symptoms worsen. . If you are having a medical emergency, call 911.   [place] / e-Visit: [email or web address]         [facility]: [facility]: [phone]                   [facility]: [phone]

## 2019-04-09 ENCOUNTER — Encounter (HOSPITAL_COMMUNITY): Payer: Self-pay

## 2019-04-09 ENCOUNTER — Other Ambulatory Visit: Payer: Self-pay

## 2019-04-09 ENCOUNTER — Ambulatory Visit (HOSPITAL_COMMUNITY)
Admission: RE | Admit: 2019-04-09 | Discharge: 2019-04-09 | Disposition: A | Discharge status: Home / Self Care | Payer: Federal, State, Local not specified - PPO | Source: Ambulatory Visit | Attending: Internal Medicine | Admitting: Internal Medicine

## 2019-04-09 ENCOUNTER — Other Ambulatory Visit: Payer: Self-pay | Admitting: Internal Medicine

## 2019-04-09 ENCOUNTER — Ambulatory Visit
Admission: RE | Admit: 2019-04-09 | Discharge: 2019-04-09 | Disposition: A | Discharge status: Home / Self Care | Payer: Federal, State, Local not specified - PPO | Source: Ambulatory Visit | Attending: Radiation Oncology | Admitting: Radiation Oncology

## 2019-04-09 DIAGNOSIS — C3490 Malignant neoplasm of unspecified part of unspecified bronchus or lung: Secondary | ICD-10-CM | POA: Diagnosis not present

## 2019-04-09 DIAGNOSIS — Z888 Allergy status to other drugs, medicaments and biological substances status: Secondary | ICD-10-CM | POA: Insufficient documentation

## 2019-04-09 DIAGNOSIS — Z79899 Other long term (current) drug therapy: Secondary | ICD-10-CM | POA: Diagnosis not present

## 2019-04-09 DIAGNOSIS — Z8249 Family history of ischemic heart disease and other diseases of the circulatory system: Secondary | ICD-10-CM | POA: Insufficient documentation

## 2019-04-09 DIAGNOSIS — I878 Other specified disorders of veins: Secondary | ICD-10-CM

## 2019-04-09 DIAGNOSIS — Z90710 Acquired absence of both cervix and uterus: Secondary | ICD-10-CM | POA: Insufficient documentation

## 2019-04-09 DIAGNOSIS — F17210 Nicotine dependence, cigarettes, uncomplicated: Secondary | ICD-10-CM | POA: Diagnosis not present

## 2019-04-09 DIAGNOSIS — Z510 Encounter for antineoplastic radiation therapy: Secondary | ICD-10-CM | POA: Diagnosis not present

## 2019-04-09 DIAGNOSIS — H269 Unspecified cataract: Secondary | ICD-10-CM | POA: Diagnosis not present

## 2019-04-09 HISTORY — PX: IR IMAGING GUIDED PORT INSERTION: IMG5740

## 2019-04-09 LAB — CBC WITH DIFFERENTIAL/PLATELET
Abs Immature Granulocytes: 0.06 10*3/uL (ref 0.00–0.07)
Basophils Absolute: 0 10*3/uL (ref 0.0–0.1)
Basophils Relative: 0 %
Eosinophils Absolute: 0 10*3/uL (ref 0.0–0.5)
Eosinophils Relative: 0 %
HCT: 39.4 % (ref 36.0–46.0)
Hemoglobin: 13 g/dL (ref 12.0–15.0)
Immature Granulocytes: 1 %
Lymphocytes Relative: 32 %
Lymphs Abs: 3.2 10*3/uL (ref 0.7–4.0)
MCH: 30.7 pg (ref 26.0–34.0)
MCHC: 33 g/dL (ref 30.0–36.0)
MCV: 93.1 fL (ref 80.0–100.0)
Monocytes Absolute: 0.5 10*3/uL (ref 0.1–1.0)
Monocytes Relative: 5 %
Neutro Abs: 6.3 10*3/uL (ref 1.7–7.7)
Neutrophils Relative %: 62 %
Platelets: 380 10*3/uL (ref 150–400)
RBC: 4.23 MIL/uL (ref 3.87–5.11)
RDW: 14.4 % (ref 11.5–15.5)
WBC: 10.1 10*3/uL (ref 4.0–10.5)
nRBC: 0 % (ref 0.0–0.2)

## 2019-04-09 LAB — PROTIME-INR
INR: 1.1 (ref 0.8–1.2)
Prothrombin Time: 13.9 seconds (ref 11.4–15.2)

## 2019-04-09 MED ORDER — MIDAZOLAM HCL 2 MG/2ML IJ SOLN
INTRAMUSCULAR | Status: AC
  Filled 2019-04-09: qty 4

## 2019-04-09 MED ORDER — CEFAZOLIN SODIUM-DEXTROSE 2-4 GM/100ML-% IV SOLN
INTRAVENOUS | Status: AC
  Administered 2019-04-09: 2 g via INTRAVENOUS
  Filled 2019-04-09: qty 100

## 2019-04-09 MED ORDER — FENTANYL CITRATE (PF) 100 MCG/2ML IJ SOLN
INTRAMUSCULAR | Status: AC | PRN
  Administered 2019-04-09 (×2): 50 ug via INTRAVENOUS

## 2019-04-09 MED ORDER — SODIUM CHLORIDE 0.9 % IV SOLN
INTRAVENOUS | Status: DC
  Administered 2019-04-09: 13:00:00 via INTRAVENOUS

## 2019-04-09 MED ORDER — HEPARIN SOD (PORK) LOCK FLUSH 100 UNIT/ML IV SOLN
INTRAVENOUS | Status: AC
  Filled 2019-04-09: qty 5

## 2019-04-09 MED ORDER — LIDOCAINE-EPINEPHRINE (PF) 2 %-1:200000 IJ SOLN
INTRAMUSCULAR | Status: AC
  Filled 2019-04-09: qty 20

## 2019-04-09 MED ORDER — LIDOCAINE-EPINEPHRINE (PF) 2 %-1:200000 IJ SOLN
INTRAMUSCULAR | Status: AC | PRN
  Administered 2019-04-09: 10 mL

## 2019-04-09 MED ORDER — CEFAZOLIN SODIUM-DEXTROSE 2-4 GM/100ML-% IV SOLN
2.0000 g | INTRAVENOUS | Status: AC
  Administered 2019-04-09: 14:00:00 2 g via INTRAVENOUS

## 2019-04-09 MED ORDER — FENTANYL CITRATE (PF) 100 MCG/2ML IJ SOLN
INTRAMUSCULAR | Status: AC
  Filled 2019-04-09: qty 2

## 2019-04-09 MED ORDER — MIDAZOLAM HCL 2 MG/2ML IJ SOLN
INTRAMUSCULAR | Status: AC | PRN
  Administered 2019-04-09 (×4): 1 mg via INTRAVENOUS

## 2019-04-09 NOTE — Procedures (Signed)
Pre Procedure Dx: Poor venous access Post Procedural Dx: Same  Successful placement of right IJ approach port-a-cath with tip at the superior caval atrial junction. The catheter is ready for immediate use.  Estimated Blood Loss: Minimal  Complications: None immediate.  Jay Shekera Beavers, MD Pager #: [phone]   

## 2019-04-09 NOTE — H&P (Signed)
Chief Complaint: Patient was seen in consultation today for small cell lung cancer/Port-a-cath placement.  Referring Physician(s): Mohamed,Mohamed  Supervising Physician: Sandi Mariscal  Patient Status: [facility] - Out-pt  History of Present Illness: Brittany Marks is a 78 y.o. 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 She was unfortunately diagnosed with small cell lung cancer in 02/2019. Her cancer is managed by Dr. Julien Nordmann. She has begun systemic chemotherapy, however she has difficulty IV access.  IR requested by Dr. Julien Nordmann for possible image-guided Port-a-cath placement. Patient awake and alert laying in bed with no complaints at this time. Denies fever, chills, chest pain, dyspnea, abdominal pain, or headache.   Past Medical History:  Diagnosis Date  . Cancer ([place])   . Cataract    Bilateral  . Constipation    pain medication  . Left rotator cuff tear   . Right rotator cuff tear     Past Surgical History:  Procedure Laterality Date  . CATARACT EXTRACTION W/ INTRAOCULAR LENS  IMPLANT, BILATERAL  2012  . DECOMPRESSIVE LUMBAR LAMINECTOMY LEVEL 2 N/A 07/29/2013   Procedure: LUMBAR LAMINECTOMY, DECOMPRESSION LUMBAR THREE TO FOUR, FOUR TO FIVE microdiscectomy l3,4 right;  Surgeon: Tobi Bastos, MD;  Location: [facility];  Service: Orthopedics;  Laterality: N/A;  . I & D SUPERIOR RIGHT SHOULDER AND CLOSURE WOUND  01-10-2011   S/P ROTATOR CUFF REPAIR  . LUMBAR LAMINECTOMY  1970'S  . LUMBAR LAMINECTOMY/DECOMPRESSION MICRODISCECTOMY N/A 06/27/2016   Procedure: L1 - L2 DISCECTOMY;  Surgeon: Melina Schools, MD;  Location: [employer];  Service: Orthopedics;  Laterality: N/A;  . [place]  . RIGHT SHOULDER ARTHROSCOPY/ OPEN DISTAL CLAVICLE RESECTION/ SAD/ OPEN ROTATOR CUFF REPAIR  11-28-2010  . SHOULDER OPEN ROTATOR CUFF REPAIR  12/19/2011   Procedure: ROTATOR CUFF REPAIR SHOULDER OPEN;  Surgeon: Magnus Sinning, MD;  Location:  [facility];  Service: Orthopedics;  Laterality: Right;  RIGHT RECURRENT OPEN REPAIR OF THE ROTATOR CUFF WITH TISSUE MEND GRAFTANTERIOR CHROMIOECTOMY  . VAGINAL HYSTERECTOMY  1979    Allergies: Diclofenac  Medications: Prior to Admission medications   Medication Sig Start Date End Date Taking? Authorizing Provider  b complex vitamins tablet Take 1 tablet by mouth daily.    [provider]  B-Complex TABS Take by mouth.    [provider]  COD LIVER OIL PO Take 1 tablet by mouth daily.     [provider]  lidocaine-prilocaine (EMLA) cream Apply 1 application topically as needed. 03/23/19   Heilingoetter, Cassandra L, PA-C  methylPREDNISolone (MEDROL DOSEPAK) 4 MG TBPK tablet Use as instructed 04/03/19   Heilingoetter, Cassandra L, PA-C  Multiple Vitamin (MULTIVITAMIN) tablet Take 1 tablet by mouth daily.    [provider]  nicotine (NICODERM CQ) 21 mg/24hr patch Place 1 patch (21 mg total) onto the skin daily. 03/10/19   Curt Bears, MD  oxyCODONE-acetaminophen (PERCOCET/ROXICET) 5-325 MG tablet Take 1 tablet by mouth every 6 (six) hours as needed.    [provider]  oxyCODONE-acetaminophen (PERCOCET/ROXICET) 5-325 MG tablet Take 1 tablet by mouth. 12/22/18   [provider]  oxymetazoline (AFRIN) 0.05 % nasal spray Place 2 sprays into the nose 2 (two) times daily as needed for congestion.    [provider]  Polyethyl Glycol-Propyl Glycol (SYSTANE) 0.4-0.3 % SOLN Apply 1 drop to eye 2 (two) times daily as needed (dry eyes).    [provider]  prochlorperazine (COMPAZINE) 10 MG  tablet Take 1 tablet (10 mg total) by mouth every 6 (six) hours as needed for nausea or vomiting. 03/10/19   Curt Bears, MD  RA Cod Liver Oil OIL Take by mouth.    [provider]  sodium chloride (OCEAN) 0.65 % nasal spray Place 1 spray into the nose 2 (two) times daily as needed for congestion.    [provider]  traMADol (ULTRAM) 50 MG tablet TAKE 1 TABLET BY MOUTH EVERY 6 TO 8 HOURS AS NEEDED FOR PAIN 01/02/19   [provider]  vitamin C (ASCORBIC ACID) 500 MG tablet Take 500 mg by mouth daily.    [provider]  vitamin E 400 UNIT capsule Take 400 Units by mouth daily.    [provider]     Family History  Problem Relation Age of Onset  . Congestive Heart Failure Mother   . Congestive Heart Failure Father     Social History   Socioeconomic History  . Marital status: Divorced    Spouse name: Not on file  . Number of children: Not on file  . Years of education: Not on file  . Highest education level: Not on file  Occupational History  . Not on file  Social Needs  . Financial resource strain: Not on file  . Food insecurity    Worry: Not on file    Inability: Not on file  . Transportation needs    Medical: No    Non-medical: No  Tobacco Use  . Smoking status: Current Every Day Smoker    Packs/day: 0.25    Years: 40.00    Pack years: 10.00    Types: Cigarettes  . Smokeless tobacco: Never Used  . Tobacco comment: trying to quit now, using the patch  Substance and Sexual Activity  . Alcohol use: Yes    Comment: RARE  . Drug use: No  . Sexual activity: Not on file  Lifestyle  . Physical activity    Days per week: Not on file    Minutes per session: Not on file  . Stress: Not on file  Relationships  . Social Herbalist on phone: Not on file    Gets together: Not on file    Attends religious service: Not on file    Active member of club or organization: Not on file    Attends meetings of clubs or organizations: Not on file    Relationship status: Not on file  Other Topics Concern  . Not on file  Social History Narrative  . Not on file     Review of Systems: A 12 point ROS discussed and pertinent positives are indicated in the HPI above.  All other systems are negative.  Review of Systems  Constitutional: Negative  for chills and fever.  Respiratory: Negative for shortness of breath and wheezing.   Cardiovascular: Negative for chest pain and palpitations.  Gastrointestinal: Negative for abdominal pain.  Neurological: Negative for headaches.  Psychiatric/Behavioral: Negative for behavioral problems and confusion.    Vital Signs: BP (!) 174/80 (BP Location: Right Arm)   Pulse 71   Temp 97.8 F (36.6 C) (Oral)   Resp 18   SpO2 97%   Physical Exam Vitals signs and nursing note reviewed.  Constitutional:      General: She is not in acute distress.    Appearance: Normal appearance.  Cardiovascular:     Rate and Rhythm: Normal rate and regular rhythm.  Heart sounds: Normal heart sounds. No murmur.  Pulmonary:     Effort: Pulmonary effort is normal. No respiratory distress.     Breath sounds: Normal breath sounds. No wheezing.  Skin:    General: Skin is warm and dry.  Neurological:     Mental Status: She is alert and oriented to person, place, and time.  Psychiatric:        Mood and Affect: Mood normal.        Behavior: Behavior normal.        Thought Content: Thought content normal.        Judgment: Judgment normal.      MD Evaluation Airway: WNL Heart: WNL Abdomen: WNL Chest/ Lungs: WNL ASA  Classification: 3 Mallampati/Airway Score: Two   Imaging: Mr Jeri Cos Wo Contrast  Result Date: 03/19/2019 CLINICAL DATA:  Small cell lung cancer staging EXAM: MRI HEAD WITHOUT AND WITH CONTRAST TECHNIQUE: Multiplanar, multiecho pulse sequences of the brain and surrounding structures were obtained without and with intravenous contrast. CONTRAST:  6 mL Gadavist COMPARISON:  None. FINDINGS: BRAIN: There is no acute infarct, acute hemorrhage or extra-axial collection. The midline structures are normal. No midline shift or other mass effect. Early confluent hyperintense T2-weighted signal of the periventricular and deep white matter, most commonly due to chronic ischemic microangiopathy.  Generalized atrophy without lobar predilection. Susceptibility-sensitive sequences show no chronic microhemorrhage or superficial siderosis. No abnormal contrast enhancement. VASCULAR: The major intracranial arterial and venous sinus flow voids are normal. SKULL AND UPPER CERVICAL SPINE: Calvarial bone marrow signal is normal. There is no skull base mass. Visualized upper cervical spine and soft tissues are normal. SINUSES/ORBITS: No fluid levels or advanced mucosal thickening. No mastoid or middle ear effusion. The orbits are normal. IMPRESSION: 1. No intracranial metastatic disease. 2. Chronic small vessel ischemia and generalized volume loss. Electronically Signed   By: Ulyses Jarred M.D.   On: 03/19/2019 22:09    Labs:  CBC: Recent Labs    03/23/19 1037 03/30/19 1203 04/06/19 0944 04/09/19 1230  WBC 12.5* 20.5* 11.6* 10.1  HGB 11.1* 11.8* 12.1 13.0  HCT 33.4* 35.9* 35.3* 39.4  PLT 154 133* 319 380    COAGS: Recent Labs    03/03/19 1159 04/09/19 1230  INR 1.1 1.1  APTT 31  --     BMP: Recent Labs    03/16/19 0842 03/23/19 1037 03/30/19 1203 04/06/19 0944  NA 138 134* 135 134*  K 3.7 3.9 4.1 4.1  CL 105 103 103 102  CO2 22 22 25 23   GLUCOSE 131* 102* 95 121*  BUN 18 21 17 21   CALCIUM 9.6 9.0 9.1 8.9  CREATININE 0.79 0.65 0.73 0.74  GFRNONAA >60 >60 >60 >60  GFRAA >60 >60 >60 >60    LIVER FUNCTION TESTS: Recent Labs    03/16/19 0842 03/23/19 1037 03/30/19 1203 04/06/19 0944  BILITOT 0.3 0.4 <0.2* 0.2*  AST 23 20 25  32  ALT 13 22 22  47*  ALKPHOS 83 125 127* 104  PROT 7.4 6.7 6.9 7.2  ALBUMIN 3.5 3.4* 3.3* 3.6     Assessment and Plan:  Small cell lung cancer currently undergoing systemic chemotherapy. Plan for image-guided Port-a-cath placement today with Dr. Pascal Lux. Patient is NPO. Afebrile and WBCs WNL. She does not take blood thinners. INR 1.1 today.  Risks and benefits of image guided port-a-catheter placement was discussed with the patient  including, but not limited to bleeding, infection, pneumothorax, or fibrin sheath development and need for additional  procedures. All of the patient's questions were answered, patient is agreeable to proceed. Consent signed and in chart.   Thank you for this interesting consult.  I greatly enjoyed meeting KLAUDIA BEIRNE and look forward to participating in their care.  A copy of this report was sent to the requesting provider on this date.  Electronically Signed: Earley Abide, PA-C 04/09/2019, 1:13 PM   I spent a total of 30 Minutes in face to face in clinical consultation, greater than 50% of which was counseling/coordinating care for small cell lung cancer/Port-a-cath placement.

## 2019-04-09 NOTE — Discharge Instructions (Signed)
Moderate Conscious Sedation, Adult, Care After These instructions provide you with information about caring for yourself after your procedure. Your health care provider may also give you more specific instructions. Your treatment has been planned according to current medical practices, but problems sometimes occur. Call your health care provider if you have any problems or questions after your procedure. What can I expect after the procedure? After your procedure, it is common:  To feel sleepy for several hours.  To feel clumsy and have poor balance for several hours.  To have poor judgment for several hours.  To vomit if you eat too soon. Follow these instructions at home: For at least 24 hours after the procedure:   Do not: ? Participate in activities where you could fall or become injured. ? Drive. ? Use heavy machinery. ? Drink alcohol. ? Take sleeping pills or medicines that cause drowsiness. ? Make important decisions or sign legal documents. ? Take care of children on your own.  Rest. Eating and drinking  Follow the diet recommended by your health care provider.  If you vomit: ? Drink water, juice, or soup when you can drink without vomiting. ? Make sure you have little or no nausea before eating solid foods. General instructions  Have a responsible adult stay with you until you are awake and alert.  Take over-the-counter and prescription medicines only as told by your health care provider.  If you smoke, do not smoke without supervision.  Keep all follow-up visits as told by your health care provider. This is important. Contact a health care provider if:  You keep feeling nauseous or you keep vomiting.  You feel light-headed.  You develop a rash.  You have a fever. Get help right away if:  You have trouble breathing. This information is not intended to replace advice given to you by your health care provider. Make sure you discuss any questions you have  with your health care provider. Document Released: 07/15/2013 Document Revised: 09/06/2017 Document Reviewed: 01/14/2016 Elsevier Patient Education  [place] An implanted port is a device that is placed under the skin. It is usually placed in the chest. The device can be used to give IV medicine, to take blood, or for dialysis. You may have an implanted port if:  You need IV medicine that would be irritating to the small veins in your hands or arms.  You need IV medicines, such as antibiotics, for a long period of time.  You need IV nutrition for a long period of time.  You need dialysis. Having a port means that your health care provider will not need to use the veins in your arms for these procedures. You may have fewer limitations when using a port than you would if you used other types of long-term IVs, and you will likely be able to return to normal activities after your incision heals. An implanted port has two main parts:  Reservoir. The reservoir is the part where a needle is inserted to give medicines or draw blood. The reservoir is round. After it is placed, it appears as a small, raised area under your skin.  Catheter. The catheter is a thin, flexible tube that connects the reservoir to a vein. Medicine that is inserted into the reservoir goes into the catheter and then into the vein. How is my port accessed? To access your port:  A numbing cream may be placed on the skin over the port site.  Your health care provider will put on a mask and sterile gloves.  The skin over your port will be cleaned carefully with a germ-killing soap and allowed to dry.  Your health care provider will gently pinch the port and insert a needle into it.  Your health care provider will check for a blood return to make sure the port is in the vein and is not clogged.  If your port needs to remain accessed to get medicine continuously (constant infusion),  your health care provider will place a clear bandage (dressing) over the needle site. The dressing and needle will need to be changed every week, or as told by your health care provider. What is flushing? Flushing helps keep the port from getting clogged. Follow instructions from your health care provider about how and when to flush the port. Ports are usually flushed with saline solution or a medicine called heparin. The need for flushing will depend on how the port is used:  If the port is only used from time to time to give medicines or draw blood, the port may need to be flushed: ? Before and after medicines have been given. ? Before and after blood has been drawn. ? As part of routine maintenance. Flushing may be recommended every 4-6 weeks.  If a constant infusion is running, the port may not need to be flushed.  Throw away any syringes in a disposal container that is meant for sharp items (sharps container). You can buy a sharps container from a pharmacy, or you can make one by using an empty hard plastic bottle with a cover. How long will my port stay implanted? The port can stay in for as long as your health care provider thinks it is needed. When it is time for the port to come out, a surgery will be done to remove it. The surgery will be similar to the procedure that was done to put the port in. Follow these instructions at home:   Flush your port as told by your health care provider.  If you need an infusion over several days, follow instructions from your health care provider about how to take care of your port site. Make sure you: ? Wash your hands with soap and water before you change your dressing. If soap and water are not available, use alcohol-based hand sanitizer. ? Change your dressing as told by your health care provider.  You may shower and remove your dressing tomorrow.  DO NOT use EMLA cream for 2 weeks after port placement as this cream will remove surgical glue on your  incision.   ? Place any used dressings or infusion bags into a plastic bag. Throw that bag in the trash. ? Keep the dressing that covers the needle clean and dry. Do not get it wet. ? Do not use scissors or sharp objects near the tube. ? Keep the tube clamped, unless it is being used.  Check your port site every day for signs of infection. Check for: ? Redness, swelling, or pain. ? Fluid or blood. ? Pus or a bad smell.  Protect the skin around the port site. ? Avoid wearing bra straps that rub or irritate the site. ? Protect the skin around your port from seat belts. Place a soft pad over your chest if needed.  Bathe or shower as told by your health care provider. The site may get wet as long as you are not actively receiving an infusion.  You may shower  tomorrow.  Return to your normal activities as told by your health care provider. Ask your health care provider what activities are safe for you.  Carry a medical alert card or wear a medical alert bracelet at all times. This will let health care providers know that you have an implanted port in case of an emergency. Get help right away if:  You have redness, swelling, or pain at the port site.  You have fluid or blood coming from your port site.  You have pus or a bad smell coming from the port site.  You have a fever. Summary  Implanted ports are usually placed in the chest for long-term IV access.  Follow instructions from your health care provider about flushing the port and changing bandages (dressings).  Take care of the area around your port by avoiding clothing that puts pressure on the area, and by watching for signs of infection.  Protect the skin around your port from seat belts. Place a soft pad over your chest if needed.  Get help right away if you have a fever or you have redness, swelling, pain, drainage, or a bad smell at the port site. This information is not intended to replace advice given to you by your  health care provider. Make sure you discuss any questions you have with your health care provider. Document Released: 09/24/2005 Document Revised: 01/16/2019 Document Reviewed: 10/27/2016 Elsevier Patient Education  2020 [employer].

## 2019-04-10 ENCOUNTER — Inpatient Hospital Stay: Payer: Federal, State, Local not specified - PPO

## 2019-04-10 ENCOUNTER — Other Ambulatory Visit: Payer: Self-pay

## 2019-04-10 VITALS — BP 143/60 | HR 72 | Temp 99.1°F | Resp 18

## 2019-04-10 DIAGNOSIS — C3411 Malignant neoplasm of upper lobe, right bronchus or lung: Secondary | ICD-10-CM | POA: Diagnosis not present

## 2019-04-10 MED ORDER — PEGFILGRASTIM-CBQV 6 MG/0.6ML ~~LOC~~ SOSY
6.0000 mg | PREFILLED_SYRINGE | Freq: Once | SUBCUTANEOUS | Status: AC
  Administered 2019-04-10: 6 mg via SUBCUTANEOUS

## 2019-04-10 MED ORDER — PEGFILGRASTIM-CBQV 6 MG/0.6ML ~~LOC~~ SOSY
PREFILLED_SYRINGE | SUBCUTANEOUS | Status: AC
  Filled 2019-04-10: qty 0.6

## 2019-04-13 ENCOUNTER — Other Ambulatory Visit: Payer: Self-pay

## 2019-04-13 ENCOUNTER — Inpatient Hospital Stay: Payer: Federal, State, Local not specified - PPO

## 2019-04-13 ENCOUNTER — Ambulatory Visit
Admission: RE | Admit: 2019-04-13 | Discharge: 2019-04-13 | Disposition: A | Discharge status: Home / Self Care | Payer: Federal, State, Local not specified - PPO | Source: Ambulatory Visit | Attending: Radiation Oncology | Admitting: Radiation Oncology

## 2019-04-13 DIAGNOSIS — Z510 Encounter for antineoplastic radiation therapy: Secondary | ICD-10-CM | POA: Diagnosis not present

## 2019-04-13 DIAGNOSIS — C3411 Malignant neoplasm of upper lobe, right bronchus or lung: Secondary | ICD-10-CM | POA: Diagnosis not present

## 2019-04-13 LAB — CMP (CANCER CENTER ONLY)
ALT: 22 U/L (ref 0–44)
AST: 18 U/L (ref 15–41)
Albumin: 3.3 g/dL — ABNORMAL LOW (ref 3.5–5.0)
Alkaline Phosphatase: 131 U/L — ABNORMAL HIGH (ref 38–126)
Anion gap: 8 (ref 5–15)
BUN: 20 mg/dL (ref 8–23)
CO2: 24 mmol/L (ref 22–32)
Calcium: 8.7 mg/dL — ABNORMAL LOW (ref 8.9–10.3)
Chloride: 102 mmol/L (ref 98–111)
Creatinine: 0.63 mg/dL (ref 0.44–1.00)
GFR, Est AFR Am: >60 mL/min (ref >60)
GFR, Estimated: >60 mL/min (ref >60)
Glucose, Bld: 97 mg/dL (ref 70–99)
Potassium: 4.3 mmol/L (ref 3.5–5.1)
Sodium: 134 mmol/L — ABNORMAL LOW (ref 135–145)
Total Bilirubin: 0.6 mg/dL (ref 0.3–1.2)
Total Protein: 6.4 g/dL — ABNORMAL LOW (ref 6.5–8.1)

## 2019-04-13 LAB — CBC WITH DIFFERENTIAL (CANCER CENTER ONLY)
Abs Immature Granulocytes: 1.01 10*3/uL — ABNORMAL HIGH (ref 0.00–0.07)
Basophils Absolute: 0.2 10*3/uL — ABNORMAL HIGH (ref 0.0–0.1)
Basophils Relative: 1 %
Eosinophils Absolute: 0.1 10*3/uL (ref 0.0–0.5)
Eosinophils Relative: 0 %
HCT: 30.4 % — ABNORMAL LOW (ref 36.0–46.0)
Hemoglobin: 10.3 g/dL — ABNORMAL LOW (ref 12.0–15.0)
Immature Granulocytes: 5 %
Lymphocytes Relative: 9 %
Lymphs Abs: 1.6 10*3/uL (ref 0.7–4.0)
MCH: 31.1 pg (ref 26.0–34.0)
MCHC: 33.9 g/dL (ref 30.0–36.0)
MCV: 91.8 fL (ref 80.0–100.0)
Monocytes Absolute: 0.8 10*3/uL (ref 0.1–1.0)
Monocytes Relative: 4 %
Neutro Abs: 15.7 10*3/uL — ABNORMAL HIGH (ref 1.7–7.7)
Neutrophils Relative %: 81 %
Platelet Count: 199 10*3/uL (ref 150–400)
RBC: 3.31 MIL/uL — ABNORMAL LOW (ref 3.87–5.11)
RDW: 14.3 % (ref 11.5–15.5)
WBC Count: 19.3 10*3/uL — ABNORMAL HIGH (ref 4.0–10.5)
nRBC: 0 % (ref 0.0–0.2)

## 2019-04-14 ENCOUNTER — Ambulatory Visit
Admission: RE | Admit: 2019-04-14 | Discharge: 2019-04-14 | Disposition: A | Discharge status: Home / Self Care | Payer: Federal, State, Local not specified - PPO | Source: Ambulatory Visit | Attending: Radiation Oncology | Admitting: Radiation Oncology

## 2019-04-14 ENCOUNTER — Telehealth: Payer: Self-pay | Admitting: *Deleted

## 2019-04-14 ENCOUNTER — Telehealth: Payer: Self-pay | Admitting: Medical Oncology

## 2019-04-14 ENCOUNTER — Other Ambulatory Visit: Payer: Self-pay

## 2019-04-14 DIAGNOSIS — Z510 Encounter for antineoplastic radiation therapy: Secondary | ICD-10-CM | POA: Diagnosis not present

## 2019-04-14 NOTE — Telephone Encounter (Signed)
er

## 2019-04-14 NOTE — Telephone Encounter (Signed)
Received TC from patient regarding upcoming scans. Reviewed type of scan, date and time and prep. Pt is having CT scan cheat, abd, pelvis done on 04/20/19 @ 3pm with labs prior to scan @ 12noon. Advised pt that her prep would at the front registration desk for pick up. Pt voiced understanding.

## 2019-04-15 ENCOUNTER — Other Ambulatory Visit: Payer: Self-pay

## 2019-04-15 ENCOUNTER — Ambulatory Visit
Admission: RE | Admit: 2019-04-15 | Discharge: 2019-04-15 | Disposition: A | Discharge status: Home / Self Care | Payer: Federal, State, Local not specified - PPO | Source: Ambulatory Visit | Attending: Radiation Oncology | Admitting: Radiation Oncology

## 2019-04-15 DIAGNOSIS — Z510 Encounter for antineoplastic radiation therapy: Secondary | ICD-10-CM | POA: Diagnosis not present

## 2019-04-16 ENCOUNTER — Encounter: Payer: Self-pay | Admitting: Radiation Oncology

## 2019-04-16 ENCOUNTER — Ambulatory Visit
Admission: RE | Admit: 2019-04-16 | Discharge: 2019-04-16 | Disposition: A | Discharge status: Home / Self Care | Payer: Federal, State, Local not specified - PPO | Source: Ambulatory Visit | Attending: Radiation Oncology | Admitting: Radiation Oncology

## 2019-04-16 ENCOUNTER — Other Ambulatory Visit: Payer: Self-pay

## 2019-04-16 DIAGNOSIS — Z510 Encounter for antineoplastic radiation therapy: Secondary | ICD-10-CM | POA: Diagnosis not present

## 2019-04-20 ENCOUNTER — Encounter (HOSPITAL_COMMUNITY): Payer: Self-pay

## 2019-04-20 ENCOUNTER — Ambulatory Visit (HOSPITAL_COMMUNITY)
Admission: RE | Admit: 2019-04-20 | Discharge: 2019-04-20 | Disposition: A | Discharge status: Home / Self Care | Payer: Federal, State, Local not specified - PPO | Source: Ambulatory Visit | Attending: Internal Medicine | Admitting: Internal Medicine

## 2019-04-20 ENCOUNTER — Other Ambulatory Visit: Payer: Self-pay

## 2019-04-20 ENCOUNTER — Inpatient Hospital Stay: Payer: Federal, State, Local not specified - PPO

## 2019-04-20 DIAGNOSIS — C3411 Malignant neoplasm of upper lobe, right bronchus or lung: Secondary | ICD-10-CM | POA: Insufficient documentation

## 2019-04-20 LAB — CBC WITH DIFFERENTIAL (CANCER CENTER ONLY)
Abs Immature Granulocytes: 1.69 10*3/uL — ABNORMAL HIGH (ref 0.00–0.07)
Basophils Absolute: 0.1 10*3/uL (ref 0.0–0.1)
Basophils Relative: 0 %
Eosinophils Absolute: 0.1 10*3/uL (ref 0.0–0.5)
Eosinophils Relative: 1 %
HCT: 34.4 % — ABNORMAL LOW (ref 36.0–46.0)
Hemoglobin: 11.5 g/dL — ABNORMAL LOW (ref 12.0–15.0)
Immature Granulocytes: 10 %
Lymphocytes Relative: 14 %
Lymphs Abs: 2.4 10*3/uL (ref 0.7–4.0)
MCH: 31.1 pg (ref 26.0–34.0)
MCHC: 33.4 g/dL (ref 30.0–36.0)
MCV: 93 fL (ref 80.0–100.0)
Monocytes Absolute: 1.8 10*3/uL — ABNORMAL HIGH (ref 0.1–1.0)
Monocytes Relative: 11 %
Neutro Abs: 10.5 10*3/uL — ABNORMAL HIGH (ref 1.7–7.7)
Neutrophils Relative %: 64 %
Platelet Count: 135 10*3/uL — ABNORMAL LOW (ref 150–400)
RBC: 3.7 MIL/uL — ABNORMAL LOW (ref 3.87–5.11)
RDW: 15.4 % (ref 11.5–15.5)
WBC Count: 16.6 10*3/uL — ABNORMAL HIGH (ref 4.0–10.5)
nRBC: 0.1 % (ref 0.0–0.2)

## 2019-04-20 LAB — CMP (CANCER CENTER ONLY)
ALT: 18 U/L (ref 0–44)
AST: 21 U/L (ref 15–41)
Albumin: 3.5 g/dL (ref 3.5–5.0)
Alkaline Phosphatase: 130 U/L — ABNORMAL HIGH (ref 38–126)
Anion gap: 7 (ref 5–15)
BUN: 13 mg/dL (ref 8–23)
CO2: 25 mmol/L (ref 22–32)
Calcium: 9 mg/dL (ref 8.9–10.3)
Chloride: 104 mmol/L (ref 98–111)
Creatinine: 0.72 mg/dL (ref 0.44–1.00)
GFR, Est AFR Am: >60 mL/min (ref >60)
GFR, Estimated: >60 mL/min (ref >60)
Glucose, Bld: 114 mg/dL — ABNORMAL HIGH (ref 70–99)
Potassium: 4.5 mmol/L (ref 3.5–5.1)
Sodium: 136 mmol/L (ref 135–145)
Total Bilirubin: <0.2 mg/dL — ABNORMAL LOW (ref 0.3–1.2)
Total Protein: 7 g/dL (ref 6.5–8.1)

## 2019-04-20 MED ORDER — SODIUM CHLORIDE (PF) 0.9 % IJ SOLN
INTRAMUSCULAR | Status: AC
  Filled 2019-04-20: qty 50

## 2019-04-20 MED ORDER — IOHEXOL 300 MG/ML  SOLN
100.0000 mL | Freq: Once | INTRAMUSCULAR | Status: AC | PRN
  Administered 2019-04-20: 100 mL via INTRAVENOUS

## 2019-04-21 ENCOUNTER — Ambulatory Visit (HOSPITAL_COMMUNITY): Payer: Federal, State, Local not specified - PPO

## 2019-04-27 ENCOUNTER — Inpatient Hospital Stay (HOSPITAL_BASED_OUTPATIENT_CLINIC_OR_DEPARTMENT_OTHER): Payer: Federal, State, Local not specified - PPO | Admitting: Physician Assistant

## 2019-04-27 ENCOUNTER — Inpatient Hospital Stay: Payer: Federal, State, Local not specified - PPO

## 2019-04-27 ENCOUNTER — Encounter: Payer: Self-pay | Admitting: Physician Assistant

## 2019-04-27 ENCOUNTER — Other Ambulatory Visit: Payer: Self-pay

## 2019-04-27 VITALS — BP 140/65 | HR 80 | Temp 98.9°F | Resp 18 | Ht 62.0 in | Wt 133.9 lb

## 2019-04-27 DIAGNOSIS — C778 Secondary and unspecified malignant neoplasm of lymph nodes of multiple regions: Secondary | ICD-10-CM

## 2019-04-27 DIAGNOSIS — C7951 Secondary malignant neoplasm of bone: Secondary | ICD-10-CM

## 2019-04-27 DIAGNOSIS — C3411 Malignant neoplasm of upper lobe, right bronchus or lung: Secondary | ICD-10-CM

## 2019-04-27 DIAGNOSIS — K5900 Constipation, unspecified: Secondary | ICD-10-CM

## 2019-04-27 DIAGNOSIS — Z79899 Other long term (current) drug therapy: Secondary | ICD-10-CM

## 2019-04-27 DIAGNOSIS — Z5112 Encounter for antineoplastic immunotherapy: Secondary | ICD-10-CM | POA: Diagnosis not present

## 2019-04-27 DIAGNOSIS — Z5111 Encounter for antineoplastic chemotherapy: Secondary | ICD-10-CM

## 2019-04-27 DIAGNOSIS — I700 Atherosclerosis of aorta: Secondary | ICD-10-CM

## 2019-04-27 DIAGNOSIS — Z9221 Personal history of antineoplastic chemotherapy: Secondary | ICD-10-CM

## 2019-04-27 DIAGNOSIS — R21 Rash and other nonspecific skin eruption: Secondary | ICD-10-CM

## 2019-04-27 DIAGNOSIS — J439 Emphysema, unspecified: Secondary | ICD-10-CM

## 2019-04-27 LAB — CMP (CANCER CENTER ONLY)
ALT: 24 U/L (ref 0–44)
AST: 26 U/L (ref 15–41)
Albumin: 3.3 g/dL — ABNORMAL LOW (ref 3.5–5.0)
Alkaline Phosphatase: 104 U/L (ref 38–126)
Anion gap: 8 (ref 5–15)
BUN: 21 mg/dL (ref 8–23)
CO2: 22 mmol/L (ref 22–32)
Calcium: 8.5 mg/dL — ABNORMAL LOW (ref 8.9–10.3)
Chloride: 106 mmol/L (ref 98–111)
Creatinine: 0.75 mg/dL (ref 0.44–1.00)
GFR, Est AFR Am: >60 mL/min (ref >60)
GFR, Estimated: >60 mL/min (ref >60)
Glucose, Bld: 116 mg/dL — ABNORMAL HIGH (ref 70–99)
Potassium: 4.2 mmol/L (ref 3.5–5.1)
Sodium: 136 mmol/L (ref 135–145)
Total Bilirubin: 0.2 mg/dL — ABNORMAL LOW (ref 0.3–1.2)
Total Protein: 6.7 g/dL (ref 6.5–8.1)

## 2019-04-27 LAB — TSH: TSH: 3.29 u[IU]/mL (ref 0.308–3.960)

## 2019-04-27 LAB — CBC WITH DIFFERENTIAL (CANCER CENTER ONLY)
Abs Immature Granulocytes: 0.08 10*3/uL — ABNORMAL HIGH (ref 0.00–0.07)
Basophils Absolute: 0 10*3/uL (ref 0.0–0.1)
Basophils Relative: 1 %
Eosinophils Absolute: 0.1 10*3/uL (ref 0.0–0.5)
Eosinophils Relative: 1 %
HCT: 32.5 % — ABNORMAL LOW (ref 36.0–46.0)
Hemoglobin: 10.9 g/dL — ABNORMAL LOW (ref 12.0–15.0)
Immature Granulocytes: 1 %
Lymphocytes Relative: 18 %
Lymphs Abs: 1.5 10*3/uL (ref 0.7–4.0)
MCH: 30.9 pg (ref 26.0–34.0)
MCHC: 33.5 g/dL (ref 30.0–36.0)
MCV: 92.1 fL (ref 80.0–100.0)
Monocytes Absolute: 0.9 10*3/uL (ref 0.1–1.0)
Monocytes Relative: 11 %
Neutro Abs: 5.9 10*3/uL (ref 1.7–7.7)
Neutrophils Relative %: 68 %
Platelet Count: 230 10*3/uL (ref 150–400)
RBC: 3.53 MIL/uL — ABNORMAL LOW (ref 3.87–5.11)
RDW: 15.3 % (ref 11.5–15.5)
WBC Count: 8.5 10*3/uL (ref 4.0–10.5)
nRBC: 0 % (ref 0.0–0.2)

## 2019-04-27 MED ORDER — SODIUM CHLORIDE 0.9 % IV SOLN
Freq: Once | INTRAVENOUS | Status: AC
  Administered 2019-04-27: 11:00:00 via INTRAVENOUS
  Filled 2019-04-27: qty 250

## 2019-04-27 MED ORDER — HEPARIN SOD (PORK) LOCK FLUSH 100 UNIT/ML IV SOLN
500.0000 [IU] | Freq: Once | INTRAVENOUS | Status: AC | PRN
  Administered 2019-04-27: 500 [IU]
  Filled 2019-04-27: qty 5

## 2019-04-27 MED ORDER — SODIUM CHLORIDE 0.9% FLUSH
10.0000 mL | INTRAVENOUS | Status: DC | PRN
  Administered 2019-04-27: 10 mL
  Filled 2019-04-27: qty 10

## 2019-04-27 MED ORDER — SODIUM CHLORIDE 0.9 % IV SOLN
1500.0000 mg | Freq: Once | INTRAVENOUS | Status: AC
  Administered 2019-04-27: 1500 mg via INTRAVENOUS
  Filled 2019-04-27: qty 30

## 2019-04-27 MED ORDER — SODIUM CHLORIDE 0.9 % IV SOLN
343.0000 mg | Freq: Once | INTRAVENOUS | Status: AC
  Administered 2019-04-27: 340 mg via INTRAVENOUS
  Filled 2019-04-27: qty 34

## 2019-04-27 MED ORDER — SODIUM CHLORIDE 0.9 % IV SOLN
100.0000 mg/m2 | Freq: Once | INTRAVENOUS | Status: AC
  Administered 2019-04-27: 160 mg via INTRAVENOUS
  Filled 2019-04-27: qty 8

## 2019-04-27 MED ORDER — PALONOSETRON HCL INJECTION 0.25 MG/5ML
0.2500 mg | Freq: Once | INTRAVENOUS | Status: AC
  Administered 2019-04-27: 0.25 mg via INTRAVENOUS

## 2019-04-27 MED ORDER — PALONOSETRON HCL INJECTION 0.25 MG/5ML
INTRAVENOUS | Status: AC
  Filled 2019-04-27: qty 5

## 2019-04-27 MED ORDER — SODIUM CHLORIDE 0.9 % IV SOLN
Freq: Once | INTRAVENOUS | Status: AC
  Administered 2019-04-27: 12:00:00 via INTRAVENOUS
  Filled 2019-04-27: qty 5

## 2019-04-27 NOTE — Progress Notes (Signed)
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  PRIOR THERAPY: None  CURRENT THERAPY: Palliative systemic chemotherapy with carboplatin for AUC of 5 on day 1, etoposide 100 mg/M2 on days 1, 2 and 3 as well as Imfinzi 1500 mg every 3 weeks with the chemotherapy and Neulasta support. First dose on 03/16/2019. Status post 2 cycles.   INTERVAL HISTORY: Brittany Marks 78 y.o. female returns to the clinic for a follow-up visit.  The patient is feeling fairly well today without any concerning complaints except for itching/rash on her left shoulder and a sore underneath her left armpit.  The patient has tried to use Neosporin on the sore. She denies any skin injuries to this area. She denies any fevers, purulent drainage, or fluctuance.  Regarding her treatment, she has been tolerating her treatment fairly well without any adverse effects.  She denies any fevers, chills, night sweats, or weight loss.  She denies any chest pain, shortness of breath, cough, or hemoptysis.  She denies any nausea, vomiting, diarrhea, or constipation.  She denies any headaches or visual changes.  She recently had a Port-A-Cath placed and tolerated the procedure fairly well without any adverse effects.  She recently had a restaging CT scan performed.  She is here today for evaluation and to review her scan results before starting cycle #3.  MEDICAL HISTORY: Past Medical History:  Diagnosis Date  . Cataract    Bilateral  . Constipation    pain medication  . Left rotator cuff tear   . Right rotator cuff tear   . SCL CA dx'd 11/2018    ALLERGIES:  is allergic to  diclofenac.  MEDICATIONS:  Current Outpatient Medications  Medication Sig Dispense Refill  . b complex vitamins tablet Take 1 tablet by mouth daily.    . COD LIVER OIL PO Take 1 tablet by mouth daily.     Marland Kitchen lidocaine-prilocaine (EMLA) cream Apply 1 application topically as needed. 30 g 0  . methylPREDNISolone (MEDROL DOSEPAK) 4 MG TBPK tablet Use as instructed 21 tablet 0  . Multiple Vitamin (MULTIVITAMIN) tablet Take 1 tablet by mouth daily.    . nicotine (NICODERM CQ) 21 mg/24hr patch Place 1 patch (21 mg total) onto the skin daily. 28 patch 0  . oxyCODONE-acetaminophen (PERCOCET/ROXICET) 5-325 MG tablet Take 1 tablet by mouth every 6 (six) hours as needed.    Marland Kitchen oxyCODONE-acetaminophen (PERCOCET/ROXICET) 5-325 MG tablet Take 1 tablet by mouth.    Marland Kitchen oxymetazoline (AFRIN) 0.05 % nasal spray Place 2 sprays into the nose 2 (two) times daily as needed for congestion.    Vladimir Faster Glycol-Propyl Glycol (SYSTANE) 0.4-0.3 % SOLN Apply 1 drop to eye 2 (two) times daily as needed (dry eyes).    . prochlorperazine (COMPAZINE) 10 MG tablet Take 1 tablet (10 mg total) by mouth every 6 (six) hours as needed for nausea or vomiting. 30 tablet 0  . sodium chloride (OCEAN) 0.65 % nasal spray Place 1 spray into the nose 2 (two) times daily as needed for congestion.    . traMADol (ULTRAM) 50 MG tablet TAKE 1 TABLET BY MOUTH EVERY 6 TO 8 HOURS AS NEEDED FOR PAIN    . vitamin  C (ASCORBIC ACID) 500 MG tablet Take 500 mg by mouth daily.    . vitamin E 400 UNIT capsule Take 400 Units by mouth daily.     No current facility-administered medications for this visit.    Facility-Administered Medications Ordered in Other Visits  Medication Dose Route Frequency Provider Last Rate Last Dose  . CARBOplatin (PARAPLATIN) 340 mg in sodium chloride 0.9 % 250 mL chemo infusion  340 mg Intravenous Once Curt Bears, MD      . durvalumab [facility]) 1,500 mg in sodium chloride 0.9 % 100 mL chemo infusion  1,500 mg  Intravenous Once Curt Bears, MD 130 mL/hr at 04/27/19 1231 1,500 mg at 04/27/19 1231  . etoposide (VEPESID) 160 mg in sodium chloride 0.9 % 500 mL chemo infusion  100 mg/m2 (Treatment Plan Recorded) Intravenous Once Curt Bears, MD      . heparin lock flush 100 unit/mL  500 Units Intracatheter Once PRN Curt Bears, MD      . sodium chloride flush (NS) 0.9 % injection 10 mL  10 mL Intracatheter PRN Curt Bears, MD        SURGICAL HISTORY:  Past Surgical History:  Procedure Laterality Date  . CATARACT EXTRACTION W/ INTRAOCULAR LENS  IMPLANT, BILATERAL  2012  . DECOMPRESSIVE LUMBAR LAMINECTOMY LEVEL 2 N/A 07/29/2013   Procedure: LUMBAR LAMINECTOMY, DECOMPRESSION LUMBAR THREE TO FOUR, FOUR TO FIVE microdiscectomy l3,4 right;  Surgeon: Tobi Bastos, MD;  Location: [facility];  Service: Orthopedics;  Laterality: N/A;  . I & D SUPERIOR RIGHT SHOULDER AND CLOSURE WOUND  01-10-2011   S/P ROTATOR CUFF REPAIR  . IR IMAGING GUIDED PORT INSERTION  04/09/2019  . LUMBAR LAMINECTOMY  1970'S  . LUMBAR LAMINECTOMY/DECOMPRESSION MICRODISCECTOMY N/A 06/27/2016   Procedure: L1 - L2 DISCECTOMY;  Surgeon: Melina Schools, MD;  Location: [place];  Service: Orthopedics;  Laterality: N/A;  . [place]  . RIGHT SHOULDER ARTHROSCOPY/ OPEN DISTAL CLAVICLE RESECTION/ SAD/ OPEN ROTATOR CUFF REPAIR  11-28-2010  . SHOULDER OPEN ROTATOR CUFF REPAIR  12/19/2011   Procedure: ROTATOR CUFF REPAIR SHOULDER OPEN;  Surgeon: Magnus Sinning, MD;  Location: [place];  Service: Orthopedics;  Laterality: Right;  RIGHT RECURRENT OPEN REPAIR OF THE ROTATOR CUFF WITH TISSUE MEND GRAFTANTERIOR CHROMIOECTOMY  . VAGINAL HYSTERECTOMY  1979    REVIEW OF SYSTEMS:   Review of Systems  Constitutional: Negative for appetite change, chills, fatigue, fever and unexpected weight change.  HENT:   Negative for mouth sores, nosebleeds, sore throat and trouble swallowing.   Eyes: Negative for eye  problems and icterus.  Respiratory: Negative for cough, hemoptysis, shortness of breath and wheezing.   Cardiovascular: Negative for chest pain and leg swelling.  Gastrointestinal: Negative for abdominal pain, constipation, diarrhea, nausea and vomiting.  Genitourinary: Negative for bladder incontinence, difficulty urinating, dysuria, frequency and hematuria.   Musculoskeletal: Negative for back pain, gait problem, neck pain and neck stiffness.  Skin: Positive for a rash on her left shoulder and an open sore under her left armpit.   Neurological: Negative for dizziness, extremity weakness, gait problem, headaches, light-headedness and seizures.  Hematological: Negative for adenopathy. Does not bruise/bleed easily.  Psychiatric/Behavioral: Negative for confusion, depression and sleep disturbance. The patient is not nervous/anxious.     PHYSICAL EXAMINATION:  Blood pressure 140/65, pulse 80, temperature 98.9 F (37.2 C), temperature source Oral, resp. rate 18, height '5\' 2"'  (1.575 m), weight 133 lb 14.4 oz (60.7 kg), SpO2 100 %.  ECOG PERFORMANCE  STATUS: 1 - Symptomatic but completely ambulatory  Physical Exam  Constitutional: Oriented to person, place, and time and chronically ill appearing female and in no distress.  HENT:  Head: Normocephalic and atraumatic.  Mouth/Throat: Oropharynx is clear and moist. No oropharyngeal exudate.  Eyes: Conjunctivae are normal. Right eye exhibits no discharge. Left eye exhibits no discharge. No scleral icterus.  Neck: Normal range of motion. Neck supple.  Cardiovascular: Normal rate, regular rhythm, normal heart sounds and intact distal pulses.   Pulmonary/Chest: Effort normal and breath sounds normal. No respiratory distress. No wheezes. No rales.  Abdominal: Soft. Bowel sounds are normal. Exhibits no distension and no mass. There is no tenderness.  Musculoskeletal: Normal range of motion. Exhibits no edema.  Lymphadenopathy:    No cervical adenopathy.   Neurological: Alert and oriented to person, place, and time. Exhibits normal muscle tone. Gait normal. Coordination normal.  Skin: maculopapular rash on left shoulder. Skin sore/erythema under her left armpit. No purulent drainage or fluctuance. Skin is warm and dry. Not diaphoretic. No pallor.  Psychiatric: Mood, memory and judgment normal.  Vitals reviewed.  LABORATORY DATA: Lab Results  Component Value Date   WBC 8.5 04/27/2019   HGB 10.9 (L) 04/27/2019   HCT 32.5 (L) 04/27/2019   MCV 92.1 04/27/2019   PLT 230 04/27/2019      Chemistry      Component Value Date/Time   NA 136 04/27/2019 1009   K 4.2 04/27/2019 1009   CL 106 04/27/2019 1009   CO2 22 04/27/2019 1009   BUN 21 04/27/2019 1009   CREATININE 0.75 04/27/2019 1009      Component Value Date/Time   CALCIUM 8.5 (L) 04/27/2019 1009   ALKPHOS 104 04/27/2019 1009   AST 26 04/27/2019 1009   ALT 24 04/27/2019 1009   BILITOT 0.2 (L) 04/27/2019 1009       RADIOGRAPHIC STUDIES:  Ct Chest W Contrast  Result Date: 04/20/2019 CLINICAL DATA:  Small-cell lung cancer. EXAM: CT CHEST, ABDOMEN, AND PELVIS WITH CONTRAST TECHNIQUE: Multidetector CT imaging of the chest, abdomen and pelvis was performed following the standard protocol during bolus administration of intravenous contrast. CONTRAST:  1100m OMNIPAQUE IOHEXOL 300 MG/ML  SOLN COMPARISON:  PET-CT 02/24/2019. FINDINGS: CT CHEST FINDINGS Cardiovascular: The heart size is normal. No substantial pericardial effusion. Coronary artery calcification is evident. Atherosclerotic calcification is noted in the wall of the thoracic aorta. Right Port-A-Cath tip positioned in the upper right atrium. Mediastinum/Nodes: 2.1 cm short axis precarinal lymph node measured previously has decreased in the interval measuring 8 mm short axis today. No hilar lymphadenopathy. The esophagus has normal imaging features. There is no axillary lymphadenopathy. Lungs/Pleura: Centrilobular emphsyema noted.  Right upper lobe pulmonary nodule seen on previous PET-CT have resolved in the interval. Subpleural reticulation in the lungs is compatible with underlying fibrotic lung disease. No new suspicious pulmonary nodule or mass. No pleural effusion. Musculoskeletal: No worrisome lytic or sclerotic osseous abnormality. CT ABDOMEN PELVIS FINDINGS Hepatobiliary: No suspicious focal abnormality within the liver parenchyma. There is no evidence for gallstones, gallbladder wall thickening, or pericholecystic fluid. No intrahepatic or extrahepatic biliary dilation. Pancreas: No focal mass lesion. No dilatation of the main duct. No intraparenchymal cyst. No peripancreatic edema. Spleen: No splenomegaly. No focal mass lesion. Adrenals/Urinary Tract: No adrenal nodule or mass. Similar appearance right hydronephrosis. Left kidney unremarkable. No evidence for hydroureter. The urinary bladder appears normal for the degree of distention. Stomach/Bowel: Stomach is unremarkable. No gastric wall thickening. No evidence of outlet obstruction.  Duodenum is normally positioned as is the ligament of Treitz. No small bowel wall thickening. No small bowel dilatation. The terminal ileum is normal. The appendix is normal. No gross colonic mass. No colonic wall thickening. Vascular/Lymphatic: There is abdominal aortic atherosclerosis without aneurysm. The lesion identified previously in the porta hepatis has decreased in the interval measuring 2.8 x 2.9 cm today compared to 5.2 x 5.4 cm previously. The retrocaval lymph node seen on previous PET-CT has no measurable residual on today's study. There is a 8 mm short axis portal caval node visible on image 60/series 2. No pelvic sidewall lymphadenopathy. Reproductive: The uterus is surgically absent. There is no adnexal mass. Other: No intraperitoneal free fluid. Musculoskeletal: No worrisome lytic or sclerotic osseous abnormality. Advanced degenerative changes noted in the lumbar spine. IMPRESSION:  1. Interval resolution of right upper lobe pulmonary nodule seen to be hypermetabolic on previous PET-CT. The lymphadenopathy in the chest and abdomen has decreased markedly in the interval. 2. Hypermetabolic lesion in the porta hepatis identified on the previous exam has markedly decreased in the interval. 3. No new or progressive findings on today's study. 4. Stable appearance of right hydronephrosis likely accentuated by extrarenal pelvis. Component of UPJ obstruction suspected. 5. 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 6.  Aortic Atherosclerois (ICD10-170.0) 7.  Emphysema. ([id number]) Electronically Signed   By: Misty Stanley M.D.   On: 04/20/2019 16:36   Ct Abdomen Pelvis W Contrast  Result Date: 04/20/2019 CLINICAL DATA:  Small-cell lung cancer. EXAM: CT CHEST, ABDOMEN, AND PELVIS WITH CONTRAST TECHNIQUE: Multidetector CT imaging of the chest, abdomen and pelvis was performed following the standard protocol during bolus administration of intravenous contrast. CONTRAST:  164m OMNIPAQUE IOHEXOL 300 MG/ML  SOLN COMPARISON:  PET-CT 02/24/2019. FINDINGS: CT CHEST FINDINGS Cardiovascular: The heart size is normal. No substantial pericardial effusion. Coronary artery calcification is evident. Atherosclerotic calcification is noted in the wall of the thoracic aorta. Right Port-A-Cath tip positioned in the upper right atrium. Mediastinum/Nodes: 2.1 cm short axis precarinal lymph node measured previously has decreased in the interval measuring 8 mm short axis today. No hilar lymphadenopathy. The esophagus has normal imaging features. There is no axillary lymphadenopathy. Lungs/Pleura: Centrilobular emphsyema noted. Right upper lobe pulmonary nodule seen on previous PET-CT have resolved in the interval. Subpleural reticulation in the lungs is compatible with underlying fibrotic lung disease. No new suspicious pulmonary nodule or mass. No pleural effusion.  Musculoskeletal: No worrisome lytic or sclerotic osseous abnormality. CT ABDOMEN PELVIS FINDINGS Hepatobiliary: No suspicious focal abnormality within the liver parenchyma. There is no evidence for gallstones, gallbladder wall thickening, or pericholecystic fluid. No intrahepatic or extrahepatic biliary dilation. Pancreas: No focal mass lesion. No dilatation of the main duct. No intraparenchymal cyst. No peripancreatic edema. Spleen: No splenomegaly. No focal mass lesion. Adrenals/Urinary Tract: No adrenal nodule or mass. Similar appearance right hydronephrosis. Left kidney unremarkable. No evidence for hydroureter. The urinary bladder appears normal for the degree of distention. Stomach/Bowel: Stomach is unremarkable. No gastric wall thickening. No evidence of outlet obstruction. Duodenum is normally positioned as is the ligament of Treitz. No small bowel wall thickening. No small bowel dilatation. The terminal ileum is normal. The appendix is normal. No gross colonic mass. No colonic wall thickening. Vascular/Lymphatic: There is abdominal aortic atherosclerosis without aneurysm. The lesion identified previously in the porta hepatis has decreased in the interval measuring 2.8 x 2.9 cm today compared to 5.2 x 5.4 cm previously. The retrocaval lymph node seen on previous PET-CT  has no measurable residual on today's study. There is a 8 mm short axis portal caval node visible on image 60/series 2. No pelvic sidewall lymphadenopathy. Reproductive: The uterus is surgically absent. There is no adnexal mass. Other: No intraperitoneal free fluid. Musculoskeletal: No worrisome lytic or sclerotic osseous abnormality. Advanced degenerative changes noted in the lumbar spine. IMPRESSION: 1. Interval resolution of right upper lobe pulmonary nodule seen to be hypermetabolic on previous PET-CT. The lymphadenopathy in the chest and abdomen has decreased markedly in the interval. 2. Hypermetabolic lesion in the porta hepatis  identified on the previous exam has markedly decreased in the interval. 3. No new or progressive findings on today's study. 4. Stable appearance of right hydronephrosis likely accentuated by extrarenal pelvis. Component of UPJ obstruction suspected. 5. 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 6.  Aortic Atherosclerois (ICD10-170.0) 7.  Emphysema. ([id number]) Electronically Signed   By: Misty Stanley M.D.   On: 04/20/2019 16:36   Ir Imaging Guided Port Insertion  Result Date: 04/09/2019 INDICATION: History of lymphoma. In need of durable intravenous access for chemotherapy administration EXAM: IMPLANTED PORT A CATH PLACEMENT WITH ULTRASOUND AND FLUOROSCOPIC GUIDANCE COMPARISON:  PET CT-02/23/2018 MEDICATIONS: Ancef 2 gm IV; The antibiotic was administered within an appropriate time interval prior to skin puncture. ANESTHESIA/SEDATION: Moderate (conscious) sedation was employed during this procedure. A total of Versed 4 mg and Fentanyl 100 mcg was administered intravenously. Moderate Sedation Time: 24 minutes. The patient's level of consciousness and vital signs were monitored continuously by radiology nursing throughout the procedure under my direct supervision. CONTRAST:  None FLUOROSCOPY TIME:  54 seconds (10 mGy) COMPLICATIONS: None immediate. PROCEDURE: The procedure, risks, benefits, and alternatives were explained to the patient. Questions regarding the procedure were encouraged and answered. The patient understands and consents to the procedure. The right neck and chest were prepped with chlorhexidine in a sterile fashion, and a sterile drape was applied covering the operative field. Maximum barrier sterile technique with sterile gowns and gloves were used for the procedure. A timeout was performed prior to the initiation of the procedure. Local anesthesia was provided with 1% lidocaine with epinephrine. After creating a small venotomy incision, a micropuncture  kit was utilized to access the internal jugular vein. Real-time ultrasound guidance was utilized for vascular access including the acquisition of a permanent ultrasound image documenting patency of the accessed vessel. The microwire was utilized to measure appropriate catheter length. A subcutaneous port pocket was then created along the upper chest wall utilizing a combination of sharp and blunt dissection. The pocket was irrigated with sterile saline. A single lumen slim power injectable port was chosen for placement. The 8 Fr catheter was tunneled from the port pocket site to the venotomy incision. The port was placed in the pocket. The external catheter was trimmed to appropriate length. At the venotomy, an 8 Fr peel-away sheath was placed over a guidewire under fluoroscopic guidance. The catheter was then placed through the sheath and the sheath was removed. Final catheter positioning was confirmed and documented with a fluoroscopic spot radiograph. The port was accessed with a Huber needle, aspirated and flushed with heparinized saline. The venotomy site was closed with an interrupted 4-0 Vicryl suture. The port pocket incision was closed with interrupted 2-0 Vicryl suture. Dermabond and Steri-strips were applied to both incisions. Dressings were placed. The patient tolerated the procedure well without immediate post procedural complication. FINDINGS: After catheter placement, the tip lies within the superior cavoatrial junction. The catheter aspirates and  flushes normally and is ready for immediate use. IMPRESSION: Successful placement of a right internal jugular approach power injectable Port-A-Cath. The catheter is ready for immediate use. Electronically Signed   By: Sandi Mariscal M.D.   On: 04/09/2019 15:55     ASSESSMENT/PLAN:  This is a very pleasant 78 year old Caucasian female recently diagnosed with extensive stage (T1b, and 3, N1 C) small cell lung cancer.  She presented with right upper lobe  pulmonary nodules in addition to bilateral hilar and right mediastinal as well as left supraclavicular and abdominal lymphadenopathy.  She also has metastatic disease to the in the left humerus.  She was diagnosed in May 2020.  The patient is currently undergoing palliative systemic chemotherapy with carboplatin for an AUC of 5 on day 1, and etoposide 100 mg/m on days 1, 2, and 3 as well as Imfinzi 1500 mg IV every 3 weeks with Neulasta support.  She is status post 2 cycles.  She tolerated her first treatment well without any adverse effects except for mild nausea which was managed with compazine and a localized rash to her left shoulder.   The patient recently had a restaging CT scan performed.  Dr. Julien Nordmann personally and independently reviewed the scan and discussed results with the patient today and her daughter who was available by phone.  The scan showed a positive response to treatment with interval resolution of the right upper lobe pulmonary nodule and decreased lymphadenopathy.  Dr. Julien Nordmann recommended the patient continue on the current treatment regimen.  The patient will receive cycle #3 today scheduled.  I will see her back for follow-up visit in 3 weeks for evaluation before starting cycle #4.  For the patient's rash on her left shoulder, she was advised to use hydrocortisone cream and Benadryl as needed for the itching. The patient will continue using Neosporin on the small sore underneath her left arm.  The patient was advised to call or contact the office should she develop new or worsening symptoms including fevers, purulent drainage, chills, night sweats, increased pain, swelling, and erythema.  The patient was advised to call immediately if she has any concerning symptoms in the interval. The patient voices understanding of current disease status and treatment options and is in agreement with the current care plan. All questions were answered. The patient knows to call the clinic  with any problems, questions or concerns. We can certainly see the patient much sooner if necessary  No orders of the defined types were placed in this encounter.    Cassandra L Heilingoetter, PA-C 04/27/19  ADDENDUM: Hematology/Oncology Attending: I had a face-to-face encounter with the patient today.  I recommended her care plan.  This is a very pleasant 78 years old white female with extensive stage small cell lung cancer.  She is currently undergoing systemic chemotherapy with carboplatin, etoposide and Imfinzi status post 2 cycles.  The patient has been tolerating this treatment well with no concerning adverse effects except for fatigue.  She has an area of erythema in the left axilla. The patient had repeat CT scan of the chest, abdomen pelvis performed recently.  I personally and independently reviewed the scan with the patient and her daughter who was available by phone during the visit. Her scan showed significant improvement in her disease. I recommended for the patient to continue her current treatment and she will proceed with cycle #3 of carboplatin, etoposide and Imfinzi today. She will come back for follow-up visit in 3 weeks for evaluation before the  next cycle of her treatment. The patient was advised to call immediately if she has any concerning symptoms in the interval.  Disclaimer: This note was dictated with voice recognition software. Similar sounding words can inadvertently be transcribed and may be missed upon review. Eilleen Kempf, MD 04/27/19

## 2019-04-27 NOTE — Patient Instructions (Signed)
Coronavirus (COVID-19) Are you at risk?  Are you at risk for the Coronavirus (COVID-19)?  To be considered HIGH RISK for Coronavirus (COVID-19), you have to meet the following criteria:  . Traveled to [place], [place], [place], [place] or [place]; or in the [place] to [place], [place], [place], or [place]; and have fever, cough, and shortness of breath within the last 2 weeks of travel OR . Been in close contact with a person diagnosed with COVID-19 within the last 2 weeks and have fever, cough, and shortness of breath . IF YOU DO NOT MEET THESE CRITERIA, YOU ARE CONSIDERED LOW RISK FOR COVID-19.  What to do if you are HIGH RISK for COVID-19?  Marland Kitchen If you are having a medical emergency, call 911. . Seek medical care right away. Before you go to a doctor's office, urgent care or emergency department, call ahead and tell them about your recent travel, contact with someone diagnosed with COVID-19, and your symptoms. You should receive instructions from your physician's office regarding next steps of care.  . When you arrive at healthcare provider, tell the healthcare staff immediately you have returned from visiting [place], [place], [place], [place] or [place]; or traveled in the [place] to [place], [place], [place], or [place]; in the last two weeks or you have been in close contact with a person diagnosed with COVID-19 in the last 2 weeks.   . Tell the health care staff about your symptoms: fever, cough and shortness of breath. . After you have been seen by a medical provider, you will be either: o Tested for (COVID-19) and discharged home on quarantine except to seek medical care if symptoms worsen, and asked to  - Stay home and avoid contact with others until you get your results (4-5 days)  - Avoid travel on public transportation if possible (such as bus, train, or airplane) or o Sent to the Emergency Department by EMS for evaluation, COVID-19 testing, and possible  admission depending on your condition and test results.  What to do if you are LOW RISK for COVID-19?  Reduce your risk of any infection by using the same precautions used for avoiding the common cold or flu:  Marland Kitchen Wash your hands often with soap and warm water for at least 20 seconds.  If soap and water are not readily available, use an alcohol-based hand sanitizer with at least 60% alcohol.  . If coughing or sneezing, cover your mouth and nose by coughing or sneezing into the elbow areas of your shirt or coat, into a tissue or into your sleeve (not your hands). . Avoid shaking hands with others and consider head nods or verbal greetings only. . Avoid touching your eyes, nose, or mouth with unwashed hands.  . Avoid close contact with people who are sick. . Avoid places or events with large numbers of people in one location, like concerts or sporting events. . Carefully consider travel plans you have or are making. . If you are planning any travel outside or inside the [place], visit the CDC's Travelers' Health webpage for the latest health notices. . If you have some symptoms but not all symptoms, continue to monitor at home and seek medical attention if your symptoms worsen. . If you are having a medical emergency, call 911.   [place] / e-Visit: [email or web address]         [facility]: [facility]gent [facility]: [facility]: [place] Discharge Instructions for Patients Receiving Chemotherapy  Today you received the following chemotherapy agents Imfinzi, Carboplatin and Etoposide  To help prevent nausea and vomiting after your treatment, we encourage you to take your nausea medication as directed.     If you develop nausea and vomiting that is not controlled by your nausea medication, call  the clinic.   BELOW ARE SYMPTOMS THAT SHOULD BE REPORTED IMMEDIATELY:  *FEVER GREATER THAN 100.5 F  *CHILLS WITH OR WITHOUT FEVER  NAUSEA AND VOMITING THAT IS NOT CONTROLLED WITH YOUR NAUSEA MEDICATION  *UNUSUAL SHORTNESS OF BREATH  *UNUSUAL BRUISING OR BLEEDING  TENDERNESS IN MOUTH AND THROAT WITH OR WITHOUT PRESENCE OF ULCERS  *URINARY PROBLEMS  *BOWEL PROBLEMS  UNUSUAL RASH Items with * indicate a potential emergency and should be followed up as soon as possible.  Feel free to call the clinic should you have any questions or concerns. The clinic phone number is [phone].  Please show the [place] at check-in to the Emergency Department and triage nurse.

## 2019-04-28 ENCOUNTER — Inpatient Hospital Stay: Payer: Federal, State, Local not specified - PPO

## 2019-04-28 ENCOUNTER — Other Ambulatory Visit: Payer: Self-pay

## 2019-04-28 ENCOUNTER — Telehealth: Payer: Self-pay | Admitting: Internal Medicine

## 2019-04-28 VITALS — BP 146/67 | HR 79 | Temp 97.7°F | Resp 19

## 2019-04-28 DIAGNOSIS — C3411 Malignant neoplasm of upper lobe, right bronchus or lung: Secondary | ICD-10-CM | POA: Diagnosis not present

## 2019-04-28 MED ORDER — SODIUM CHLORIDE 0.9 % IV SOLN
100.0000 mg/m2 | Freq: Once | INTRAVENOUS | Status: AC
  Administered 2019-04-28: 160 mg via INTRAVENOUS
  Filled 2019-04-28: qty 8

## 2019-04-28 MED ORDER — DEXAMETHASONE SODIUM PHOSPHATE 10 MG/ML IJ SOLN
10.0000 mg | Freq: Once | INTRAMUSCULAR | Status: AC
  Administered 2019-04-28: 10 mg via INTRAVENOUS

## 2019-04-28 MED ORDER — DEXAMETHASONE SODIUM PHOSPHATE 10 MG/ML IJ SOLN
INTRAMUSCULAR | Status: AC
  Filled 2019-04-28: qty 1

## 2019-04-28 MED ORDER — HEPARIN SOD (PORK) LOCK FLUSH 100 UNIT/ML IV SOLN
500.0000 [IU] | Freq: Once | INTRAVENOUS | Status: AC | PRN
  Administered 2019-04-28: 500 [IU]
  Filled 2019-04-28: qty 5

## 2019-04-28 MED ORDER — SODIUM CHLORIDE 0.9% FLUSH
10.0000 mL | INTRAVENOUS | Status: DC | PRN
  Administered 2019-04-28: 10 mL
  Filled 2019-04-28: qty 10

## 2019-04-28 MED ORDER — SODIUM CHLORIDE 0.9 % IV SOLN
Freq: Once | INTRAVENOUS | Status: AC
  Administered 2019-04-28: 14:00:00 via INTRAVENOUS
  Filled 2019-04-28: qty 250

## 2019-04-28 NOTE — Patient Instructions (Signed)
[facility] Discharge Instructions for Patients Receiving Chemotherapy  Today you received the following chemotherapy agents: Etoposide (Vepeside, VP-16)  To help prevent nausea and vomiting after your treatment, we encourage you to take your nausea medication as directed.   If you develop nausea and vomiting that is not controlled by your nausea medication, call the clinic.   BELOW ARE SYMPTOMS THAT SHOULD BE REPORTED IMMEDIATELY:  *FEVER GREATER THAN 100.5 F  *CHILLS WITH OR WITHOUT FEVER  NAUSEA AND VOMITING THAT IS NOT CONTROLLED WITH YOUR NAUSEA MEDICATION  *UNUSUAL SHORTNESS OF BREATH  *UNUSUAL BRUISING OR BLEEDING  TENDERNESS IN MOUTH AND THROAT WITH OR WITHOUT PRESENCE OF ULCERS  *URINARY PROBLEMS  *BOWEL PROBLEMS  UNUSUAL RASH Items with * indicate a potential emergency and should be followed up as soon as possible.  Feel free to call the clinic should you have any questions or concerns. The clinic phone number is [phone].  Please show the [place] at check-in to the Emergency Department and triage nurse.  Coronavirus (COVID-19) Are you at risk?  Are you at risk for the Coronavirus (COVID-19)?  To be considered HIGH RISK for Coronavirus (COVID-19), you have to meet the following criteria:  . Traveled to [place], [place], [place], [place] or [place]; or in the [place] to [place], [place], [place], or [place]; and have fever, cough, and shortness of breath within the last 2 weeks of travel OR . Been in close contact with a person diagnosed with COVID-19 within the last 2 weeks and have fever, cough, and shortness of breath . IF YOU DO NOT MEET THESE CRITERIA, YOU ARE CONSIDERED LOW RISK FOR COVID-19.  What to do if you are HIGH RISK for COVID-19?  Marland Kitchen If you are having a medical emergency, call 911. . Seek medical care right away. Before you go to a doctor's office, urgent care or emergency department, call ahead and tell  them about your recent travel, contact with someone diagnosed with COVID-19, and your symptoms. You should receive instructions from your physician's office regarding next steps of care.  . When you arrive at healthcare provider, tell the healthcare staff immediately you have returned from visiting [place], [place], [place], [place] or [place]; or traveled in the [place] to [place], [place], [place], or [place]; in the last two weeks or you have been in close contact with a person diagnosed with COVID-19 in the last 2 weeks.   . Tell the health care staff about your symptoms: fever, cough and shortness of breath. . After you have been seen by a medical provider, you will be either: o Tested for (COVID-19) and discharged home on quarantine except to seek medical care if symptoms worsen, and asked to  - Stay home and avoid contact with others until you get your results (4-5 days)  - Avoid travel on public transportation if possible (such as bus, train, or airplane) or o Sent to the Emergency Department by EMS for evaluation, COVID-19 testing, and possible admission depending on your condition and test results.  What to do if you are LOW RISK for COVID-19?  Reduce your risk of any infection by using the same precautions used for avoiding the common cold or flu:  Marland Kitchen Wash your hands often with soap and warm water for at least 20 seconds.  If soap and water are not readily available, use an alcohol-based hand sanitizer with at least 60% alcohol.  . If coughing  or sneezing, cover your mouth and nose by coughing or sneezing into the elbow areas of your shirt or coat, into a tissue or into your sleeve (not your hands). . Avoid shaking hands with others and consider head nods or verbal greetings only. . Avoid touching your eyes, nose, or mouth with unwashed hands.  . Avoid close contact with people who are sick. . Avoid places or events with large numbers of people in one location, like concerts or  sporting events. . Carefully consider travel plans you have or are making. . If you are planning any travel outside or inside the [place], visit the CDC's Travelers' Health webpage for the latest health notices. . If you have some symptoms but not all symptoms, continue to monitor at home and seek medical attention if your symptoms worsen. . If you are having a medical emergency, call 911.   [place] / e-Visit: [email or web address]         [facility]: [facility]: [phone]                   [facility]: [phone]

## 2019-04-28 NOTE — Telephone Encounter (Signed)
Scheduled appt per 7/20 los - pt to get an updated schedule next visit.

## 2019-04-29 ENCOUNTER — Inpatient Hospital Stay: Payer: Federal, State, Local not specified - PPO

## 2019-04-29 ENCOUNTER — Other Ambulatory Visit: Payer: Self-pay

## 2019-04-29 VITALS — BP 134/75 | HR 80 | Temp 99.6°F | Resp 18

## 2019-04-29 DIAGNOSIS — C3411 Malignant neoplasm of upper lobe, right bronchus or lung: Secondary | ICD-10-CM

## 2019-04-29 MED ORDER — SODIUM CHLORIDE 0.9 % IV SOLN
100.0000 mg/m2 | Freq: Once | INTRAVENOUS | Status: AC
  Administered 2019-04-29: 160 mg via INTRAVENOUS
  Filled 2019-04-29: qty 8

## 2019-04-29 MED ORDER — DEXAMETHASONE SODIUM PHOSPHATE 10 MG/ML IJ SOLN
INTRAMUSCULAR | Status: AC
  Filled 2019-04-29: qty 1

## 2019-04-29 MED ORDER — HEPARIN SOD (PORK) LOCK FLUSH 100 UNIT/ML IV SOLN
500.0000 [IU] | Freq: Once | INTRAVENOUS | Status: AC | PRN
  Administered 2019-04-29: 500 [IU]
  Filled 2019-04-29: qty 5

## 2019-04-29 MED ORDER — SODIUM CHLORIDE 0.9 % IV SOLN
Freq: Once | INTRAVENOUS | Status: AC
  Administered 2019-04-29: 15:00:00 via INTRAVENOUS
  Filled 2019-04-29: qty 250

## 2019-04-29 MED ORDER — DEXAMETHASONE SODIUM PHOSPHATE 10 MG/ML IJ SOLN
10.0000 mg | Freq: Once | INTRAMUSCULAR | Status: AC
  Administered 2019-04-29: 10 mg via INTRAVENOUS

## 2019-04-29 MED ORDER — SODIUM CHLORIDE 0.9% FLUSH
10.0000 mL | INTRAVENOUS | Status: DC | PRN
  Administered 2019-04-29: 10 mL
  Filled 2019-04-29: qty 10

## 2019-04-29 NOTE — Patient Instructions (Signed)
[facility] Discharge Instructions for Patients Receiving Chemotherapy  Today you received the following chemotherapy agents: Etoposide (Vepeside, VP-16)  To help prevent nausea and vomiting after your treatment, we encourage you to take your nausea medication as directed.   If you develop nausea and vomiting that is not controlled by your nausea medication, call the clinic.   BELOW ARE SYMPTOMS THAT SHOULD BE REPORTED IMMEDIATELY:  *FEVER GREATER THAN 100.5 F  *CHILLS WITH OR WITHOUT FEVER  NAUSEA AND VOMITING THAT IS NOT CONTROLLED WITH YOUR NAUSEA MEDICATION  *UNUSUAL SHORTNESS OF BREATH  *UNUSUAL BRUISING OR BLEEDING  TENDERNESS IN MOUTH AND THROAT WITH OR WITHOUT PRESENCE OF ULCERS  *URINARY PROBLEMS  *BOWEL PROBLEMS  UNUSUAL RASH Items with * indicate a potential emergency and should be followed up as soon as possible.  Feel free to call the clinic should you have any questions or concerns. The clinic phone number is [phone].  Please show the [place] at check-in to the Emergency Department and triage nurse.  Coronavirus (COVID-19) Are you at risk?  Are you at risk for the Coronavirus (COVID-19)?  To be considered HIGH RISK for Coronavirus (COVID-19), you have to meet the following criteria:  . Traveled to [place], [place], [place], [place] or [place]; or in the [place] to [place], [place], [place], or [place]; and have fever, cough, and shortness of breath within the last 2 weeks of travel OR . Been in close contact with a person diagnosed with COVID-19 within the last 2 weeks and have fever, cough, and shortness of breath . IF YOU DO NOT MEET THESE CRITERIA, YOU ARE CONSIDERED LOW RISK FOR COVID-19.  What to do if you are HIGH RISK for COVID-19?  Marland Kitchen If you are having a medical emergency, call 911. . Seek medical care right away. Before you go to a doctor's office, urgent care or emergency department, call ahead and tell  them about your recent travel, contact with someone diagnosed with COVID-19, and your symptoms. You should receive instructions from your physician's office regarding next steps of care.  . When you arrive at healthcare provider, tell the healthcare staff immediately you have returned from visiting [place], [place], [place], [place] or [place]; or traveled in the [place] to [place], [place], [place], or [place]; in the last two weeks or you have been in close contact with a person diagnosed with COVID-19 in the last 2 weeks.   . Tell the health care staff about your symptoms: fever, cough and shortness of breath. . After you have been seen by a medical provider, you will be either: o Tested for (COVID-19) and discharged home on quarantine except to seek medical care if symptoms worsen, and asked to  - Stay home and avoid contact with others until you get your results (4-5 days)  - Avoid travel on public transportation if possible (such as bus, train, or airplane) or o Sent to the Emergency Department by EMS for evaluation, COVID-19 testing, and possible admission depending on your condition and test results.  What to do if you are LOW RISK for COVID-19?  Reduce your risk of any infection by using the same precautions used for avoiding the common cold or flu:  Marland Kitchen Wash your hands often with soap and warm water for at least 20 seconds.  If soap and water are not readily available, use an alcohol-based hand sanitizer with at least 60% alcohol.  . If coughing  or sneezing, cover your mouth and nose by coughing or sneezing into the elbow areas of your shirt or coat, into a tissue or into your sleeve (not your hands). . Avoid shaking hands with others and consider head nods or verbal greetings only. . Avoid touching your eyes, nose, or mouth with unwashed hands.  . Avoid close contact with people who are sick. . Avoid places or events with large numbers of people in one location, like concerts or  sporting events. . Carefully consider travel plans you have or are making. . If you are planning any travel outside or inside the [place], visit the CDC's Travelers' Health webpage for the latest health notices. . If you have some symptoms but not all symptoms, continue to monitor at home and seek medical attention if your symptoms worsen. . If you are having a medical emergency, call 911.   [place] / e-Visit: [email or web address]         [facility]: [facility]: [phone]                   [facility]: [phone]

## 2019-04-30 ENCOUNTER — Telehealth: Payer: Self-pay | Admitting: *Deleted

## 2019-04-30 NOTE — Telephone Encounter (Signed)
Patient called and received fractions to left arm.  She is curious if any follow up scans will be performed and if the radiation was effective.  She does report improved movement and pain but other than that she wanted to know if the radiation was effective and how would that be determined.  Would you consider calling the patient to explain next steps and/or to discuss her concerns.

## 2019-05-01 ENCOUNTER — Other Ambulatory Visit: Payer: Self-pay

## 2019-05-01 ENCOUNTER — Inpatient Hospital Stay: Payer: Federal, State, Local not specified - PPO

## 2019-05-01 VITALS — BP 119/57 | HR 80 | Temp 99.1°F | Resp 18

## 2019-05-01 DIAGNOSIS — C3411 Malignant neoplasm of upper lobe, right bronchus or lung: Secondary | ICD-10-CM | POA: Diagnosis not present

## 2019-05-01 MED ORDER — PEGFILGRASTIM-CBQV 6 MG/0.6ML ~~LOC~~ SOSY
PREFILLED_SYRINGE | SUBCUTANEOUS | Status: AC
  Filled 2019-05-01: qty 0.6

## 2019-05-01 MED ORDER — PEGFILGRASTIM-CBQV 6 MG/0.6ML ~~LOC~~ SOSY
6.0000 mg | PREFILLED_SYRINGE | Freq: Once | SUBCUTANEOUS | Status: AC
  Administered 2019-05-01: 6 mg via SUBCUTANEOUS

## 2019-05-01 NOTE — Patient Instructions (Signed)

## 2019-05-04 ENCOUNTER — Other Ambulatory Visit: Payer: Self-pay

## 2019-05-04 ENCOUNTER — Telehealth: Payer: Self-pay | Admitting: Radiation Oncology

## 2019-05-04 ENCOUNTER — Inpatient Hospital Stay: Payer: Federal, State, Local not specified - PPO

## 2019-05-04 DIAGNOSIS — C3411 Malignant neoplasm of upper lobe, right bronchus or lung: Secondary | ICD-10-CM | POA: Diagnosis not present

## 2019-05-04 LAB — CMP (CANCER CENTER ONLY)
ALT: 21 U/L (ref 0–44)
AST: 19 U/L (ref 15–41)
Albumin: 3.3 g/dL — ABNORMAL LOW (ref 3.5–5.0)
Alkaline Phosphatase: 138 U/L — ABNORMAL HIGH (ref 38–126)
Anion gap: 6 (ref 5–15)
BUN: 20 mg/dL (ref 8–23)
CO2: 24 mmol/L (ref 22–32)
Calcium: 8.7 mg/dL — ABNORMAL LOW (ref 8.9–10.3)
Chloride: 105 mmol/L (ref 98–111)
Creatinine: 0.62 mg/dL (ref 0.44–1.00)
GFR, Est AFR Am: >60 mL/min (ref >60)
GFR, Estimated: >60 mL/min (ref >60)
Glucose, Bld: 88 mg/dL (ref 70–99)
Potassium: 4.3 mmol/L (ref 3.5–5.1)
Sodium: 135 mmol/L (ref 135–145)
Total Bilirubin: 0.4 mg/dL (ref 0.3–1.2)
Total Protein: 6.3 g/dL — ABNORMAL LOW (ref 6.5–8.1)

## 2019-05-04 LAB — CBC WITH DIFFERENTIAL (CANCER CENTER ONLY)
Abs Immature Granulocytes: 2.27 10*3/uL — ABNORMAL HIGH (ref 0.00–0.07)
Basophils Absolute: 0 10*3/uL (ref 0.0–0.1)
Basophils Relative: 0 %
Eosinophils Absolute: 0.1 10*3/uL (ref 0.0–0.5)
Eosinophils Relative: 0 %
HCT: 29.1 % — ABNORMAL LOW (ref 36.0–46.0)
Hemoglobin: 9.6 g/dL — ABNORMAL LOW (ref 12.0–15.0)
Immature Granulocytes: 8 %
Lymphocytes Relative: 7 %
Lymphs Abs: 2 10*3/uL (ref 0.7–4.0)
MCH: 31.4 pg (ref 26.0–34.0)
MCHC: 33 g/dL (ref 30.0–36.0)
MCV: 95.1 fL (ref 80.0–100.0)
Monocytes Absolute: 0.4 10*3/uL (ref 0.1–1.0)
Monocytes Relative: 2 %
Neutro Abs: 22.6 10*3/uL — ABNORMAL HIGH (ref 1.7–7.7)
Neutrophils Relative %: 83 %
Platelet Count: 182 10*3/uL (ref 150–400)
RBC: 3.06 MIL/uL — ABNORMAL LOW (ref 3.87–5.11)
RDW: 15.6 % — ABNORMAL HIGH (ref 11.5–15.5)
WBC Count: 27.3 10*3/uL — ABNORMAL HIGH (ref 4.0–10.5)
nRBC: 0 % (ref 0.0–0.2)

## 2019-05-04 NOTE — Telephone Encounter (Signed)
  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  Interval Since Last Radiation: 3 weeks   04/02/2019-04/16/2019:  30 Gy in 10 fractions was given to the left proximal humerus.   Narrative:  The patient was contacted today for routine follow-up. During treatment she did very well with radiotherapy and did not have significant desquamation.   Impression/Plan: 1. 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 I called the patient and her pain is much improved. We discussed that we would be happy to continue to follow her as needed, but she will also continue to follow up with Dr. Julien Nordmann in medical oncology. She is also to follow up with Dr. Onnie Graham to determine if there is a role for any surgical pinning.  2. PCI. She is aware that if she has a complete response, she may be a candidate for prophylactic cranial irradiation. She will be rereferred back depending on her response to treatment.     Carola Rhine, PAC

## 2019-05-11 ENCOUNTER — Inpatient Hospital Stay: Payer: Federal, State, Local not specified - PPO | Attending: Oncology

## 2019-05-11 ENCOUNTER — Other Ambulatory Visit: Payer: Self-pay

## 2019-05-11 ENCOUNTER — Inpatient Hospital Stay: Payer: Federal, State, Local not specified - PPO

## 2019-05-11 DIAGNOSIS — Z79899 Other long term (current) drug therapy: Secondary | ICD-10-CM | POA: Insufficient documentation

## 2019-05-11 DIAGNOSIS — I700 Atherosclerosis of aorta: Secondary | ICD-10-CM | POA: Diagnosis not present

## 2019-05-11 DIAGNOSIS — Z5111 Encounter for antineoplastic chemotherapy: Secondary | ICD-10-CM | POA: Diagnosis not present

## 2019-05-11 DIAGNOSIS — Z7689 Persons encountering health services in other specified circumstances: Secondary | ICD-10-CM | POA: Insufficient documentation

## 2019-05-11 DIAGNOSIS — C3411 Malignant neoplasm of upper lobe, right bronchus or lung: Secondary | ICD-10-CM

## 2019-05-11 DIAGNOSIS — J439 Emphysema, unspecified: Secondary | ICD-10-CM | POA: Diagnosis not present

## 2019-05-11 DIAGNOSIS — C7951 Secondary malignant neoplasm of bone: Secondary | ICD-10-CM | POA: Insufficient documentation

## 2019-05-11 DIAGNOSIS — K5900 Constipation, unspecified: Secondary | ICD-10-CM | POA: Insufficient documentation

## 2019-05-11 DIAGNOSIS — R5383 Other fatigue: Secondary | ICD-10-CM | POA: Insufficient documentation

## 2019-05-11 DIAGNOSIS — R21 Rash and other nonspecific skin eruption: Secondary | ICD-10-CM | POA: Insufficient documentation

## 2019-05-11 DIAGNOSIS — C778 Secondary and unspecified malignant neoplasm of lymph nodes of multiple regions: Secondary | ICD-10-CM | POA: Insufficient documentation

## 2019-05-11 LAB — CMP (CANCER CENTER ONLY)
ALT: 26 U/L (ref 0–44)
AST: 26 U/L (ref 15–41)
Albumin: 3.5 g/dL (ref 3.5–5.0)
Alkaline Phosphatase: 138 U/L — ABNORMAL HIGH (ref 38–126)
Anion gap: 8 (ref 5–15)
BUN: 17 mg/dL (ref 8–23)
CO2: 24 mmol/L (ref 22–32)
Calcium: 9.3 mg/dL (ref 8.9–10.3)
Chloride: 106 mmol/L (ref 98–111)
Creatinine: 0.76 mg/dL (ref 0.44–1.00)
GFR, Est AFR Am: >60 mL/min (ref >60)
GFR, Estimated: >60 mL/min (ref >60)
Glucose, Bld: 107 mg/dL — ABNORMAL HIGH (ref 70–99)
Potassium: 4.1 mmol/L (ref 3.5–5.1)
Sodium: 138 mmol/L (ref 135–145)
Total Bilirubin: <0.2 mg/dL — ABNORMAL LOW (ref 0.3–1.2)
Total Protein: 6.8 g/dL (ref 6.5–8.1)

## 2019-05-11 LAB — CBC WITH DIFFERENTIAL (CANCER CENTER ONLY)
Abs Immature Granulocytes: 2.61 10*3/uL — ABNORMAL HIGH (ref 0.00–0.07)
Basophils Absolute: 0.1 10*3/uL (ref 0.0–0.1)
Basophils Relative: 1 %
Eosinophils Absolute: 0.2 10*3/uL (ref 0.0–0.5)
Eosinophils Relative: 1 %
HCT: 33.7 % — ABNORMAL LOW (ref 36.0–46.0)
Hemoglobin: 11.1 g/dL — ABNORMAL LOW (ref 12.0–15.0)
Immature Granulocytes: 15 %
Lymphocytes Relative: 13 %
Lymphs Abs: 2.3 10*3/uL (ref 0.7–4.0)
MCH: 31.8 pg (ref 26.0–34.0)
MCHC: 32.9 g/dL (ref 30.0–36.0)
MCV: 96.6 fL (ref 80.0–100.0)
Monocytes Absolute: 1.6 10*3/uL — ABNORMAL HIGH (ref 0.1–1.0)
Monocytes Relative: 9 %
Neutro Abs: 10.8 10*3/uL — ABNORMAL HIGH (ref 1.7–7.7)
Neutrophils Relative %: 61 %
Platelet Count: 148 10*3/uL — ABNORMAL LOW (ref 150–400)
RBC: 3.49 MIL/uL — ABNORMAL LOW (ref 3.87–5.11)
RDW: 17.4 % — ABNORMAL HIGH (ref 11.5–15.5)
WBC Count: 17.6 10*3/uL — ABNORMAL HIGH (ref 4.0–10.5)
nRBC: 0.5 % — ABNORMAL HIGH (ref 0.0–0.2)

## 2019-05-14 ENCOUNTER — Encounter: Payer: Self-pay | Admitting: Internal Medicine

## 2019-05-14 NOTE — Progress Notes (Signed)
Pt called w/ a change of mind about applying for the [place] but unfortunately she's overqualified for that grant, however, the [employer] has opened up funding for her Dx so I completed the application online and she was approved for Imfinzi for $8,000 from 04/14/19 through 04/12/20.

## 2019-05-18 ENCOUNTER — Inpatient Hospital Stay: Payer: Federal, State, Local not specified - PPO

## 2019-05-18 ENCOUNTER — Encounter: Payer: Self-pay | Admitting: Internal Medicine

## 2019-05-18 ENCOUNTER — Inpatient Hospital Stay (HOSPITAL_BASED_OUTPATIENT_CLINIC_OR_DEPARTMENT_OTHER): Payer: Federal, State, Local not specified - PPO | Admitting: Internal Medicine

## 2019-05-18 ENCOUNTER — Telehealth: Payer: Self-pay | Admitting: Internal Medicine

## 2019-05-18 ENCOUNTER — Other Ambulatory Visit: Payer: Self-pay

## 2019-05-18 VITALS — BP 125/104 | HR 72 | Temp 98.9°F | Resp 22 | Ht 62.0 in | Wt 136.9 lb

## 2019-05-18 VITALS — BP 167/73 | HR 72 | Resp 19

## 2019-05-18 DIAGNOSIS — C3411 Malignant neoplasm of upper lobe, right bronchus or lung: Secondary | ICD-10-CM

## 2019-05-18 DIAGNOSIS — Z5112 Encounter for antineoplastic immunotherapy: Secondary | ICD-10-CM | POA: Diagnosis not present

## 2019-05-18 DIAGNOSIS — Z5111 Encounter for antineoplastic chemotherapy: Secondary | ICD-10-CM

## 2019-05-18 LAB — CMP (CANCER CENTER ONLY)
ALT: 21 U/L (ref 0–44)
AST: 20 U/L (ref 15–41)
Albumin: 3.7 g/dL (ref 3.5–5.0)
Alkaline Phosphatase: 106 U/L (ref 38–126)
Anion gap: 10 (ref 5–15)
BUN: 24 mg/dL — ABNORMAL HIGH (ref 8–23)
CO2: 19 mmol/L — ABNORMAL LOW (ref 22–32)
Calcium: 9.2 mg/dL (ref 8.9–10.3)
Chloride: 106 mmol/L (ref 98–111)
Creatinine: 0.79 mg/dL (ref 0.44–1.00)
GFR, Est AFR Am: >60 mL/min (ref >60)
GFR, Estimated: >60 mL/min (ref >60)
Glucose, Bld: 87 mg/dL (ref 70–99)
Potassium: 4.2 mmol/L (ref 3.5–5.1)
Sodium: 135 mmol/L (ref 135–145)
Total Bilirubin: 0.3 mg/dL (ref 0.3–1.2)
Total Protein: 6.9 g/dL (ref 6.5–8.1)

## 2019-05-18 LAB — CBC WITH DIFFERENTIAL (CANCER CENTER ONLY)
Abs Immature Granulocytes: 0.05 10*3/uL (ref 0.00–0.07)
Basophils Absolute: 0.1 10*3/uL (ref 0.0–0.1)
Basophils Relative: 1 %
Eosinophils Absolute: 0.1 10*3/uL (ref 0.0–0.5)
Eosinophils Relative: 1 %
HCT: 32.2 % — ABNORMAL LOW (ref 36.0–46.0)
Hemoglobin: 10.7 g/dL — ABNORMAL LOW (ref 12.0–15.0)
Immature Granulocytes: 1 %
Lymphocytes Relative: 19 %
Lymphs Abs: 1.7 10*3/uL (ref 0.7–4.0)
MCH: 32 pg (ref 26.0–34.0)
MCHC: 33.2 g/dL (ref 30.0–36.0)
MCV: 96.4 fL (ref 80.0–100.0)
Monocytes Absolute: 1 10*3/uL (ref 0.1–1.0)
Monocytes Relative: 11 %
Neutro Abs: 6.4 10*3/uL (ref 1.7–7.7)
Neutrophils Relative %: 67 %
Platelet Count: 219 10*3/uL (ref 150–400)
RBC: 3.34 MIL/uL — ABNORMAL LOW (ref 3.87–5.11)
RDW: 16.9 % — ABNORMAL HIGH (ref 11.5–15.5)
WBC Count: 9.4 10*3/uL (ref 4.0–10.5)
nRBC: 0 % (ref 0.0–0.2)

## 2019-05-18 LAB — TSH: TSH: 2.893 u[IU]/mL (ref 0.308–3.960)

## 2019-05-18 MED ORDER — SODIUM CHLORIDE 0.9 % IV SOLN
Freq: Once | INTRAVENOUS | Status: AC
  Administered 2019-05-18: 13:00:00 via INTRAVENOUS
  Filled 2019-05-18: qty 5

## 2019-05-18 MED ORDER — SODIUM CHLORIDE 0.9 % IV SOLN
100.0000 mg/m2 | Freq: Once | INTRAVENOUS | Status: AC
  Administered 2019-05-18: 160 mg via INTRAVENOUS
  Filled 2019-05-18: qty 8

## 2019-05-18 MED ORDER — PALONOSETRON HCL INJECTION 0.25 MG/5ML
INTRAVENOUS | Status: AC
  Filled 2019-05-18: qty 5

## 2019-05-18 MED ORDER — SODIUM CHLORIDE 0.9 % IV SOLN
1500.0000 mg | Freq: Once | INTRAVENOUS | Status: AC
  Administered 2019-05-18: 1500 mg via INTRAVENOUS
  Filled 2019-05-18: qty 30

## 2019-05-18 MED ORDER — SODIUM CHLORIDE 0.9 % IV SOLN
Freq: Once | INTRAVENOUS | Status: AC
  Administered 2019-05-18: 13:00:00 via INTRAVENOUS
  Filled 2019-05-18: qty 250

## 2019-05-18 MED ORDER — HEPARIN SOD (PORK) LOCK FLUSH 100 UNIT/ML IV SOLN
500.0000 [IU] | Freq: Once | INTRAVENOUS | Status: AC | PRN
  Administered 2019-05-18: 500 [IU]
  Filled 2019-05-18: qty 5

## 2019-05-18 MED ORDER — PALONOSETRON HCL INJECTION 0.25 MG/5ML
0.2500 mg | Freq: Once | INTRAVENOUS | Status: AC
  Administered 2019-05-18: 0.25 mg via INTRAVENOUS

## 2019-05-18 MED ORDER — SODIUM CHLORIDE 0.9% FLUSH
10.0000 mL | INTRAVENOUS | Status: DC | PRN
  Administered 2019-05-18: 10 mL
  Filled 2019-05-18: qty 10

## 2019-05-18 MED ORDER — SODIUM CHLORIDE 0.9 % IV SOLN
343.0000 mg | Freq: Once | INTRAVENOUS | Status: AC
  Administered 2019-05-18: 340 mg via INTRAVENOUS
  Filled 2019-05-18: qty 34

## 2019-05-18 NOTE — Progress Notes (Signed)
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  PRIOR THERAPY: None  CURRENT THERAPY: Palliative systemic chemotherapy with carboplatin for AUC of 5 on day 1, etoposide 100 mg/M2 on days 1, 2 and 3 as well as Imfinzi 1500 mg every 3 weeks with the chemotherapy and Neulasta support. First dose on 03/16/2019. Status post 3 cycles.   INTERVAL HISTORY: Brittany Marks 78 y.o. female returns to the clinic today for follow-up visit.  The patient is feeling fine today with no concerning complaints except for fatigue as well as rash on the left side of her shoulder.  It is at the same area where she had previous radiotherapy.  She is currently applying hydrocortisone cream as well as Benadryl with little improvement.  She also has nosebleed earlier today but that completely resolved.  She denied having any chest pain, shortness of breath, cough or hemoptysis.  She denied having any fever or chills.  She has no nausea, vomiting, diarrhea or constipation.  She has no headache or visual changes.  MEDICAL HISTORY: Past Medical History:  Diagnosis Date   Cataract    Bilateral   Constipation    pain medication   Left rotator cuff tear    Right rotator cuff tear    SCL CA dx'd 11/2018    ALLERGIES:  is allergic to diclofenac.  MEDICATIONS:  Current Outpatient Medications  Medication Sig Dispense Refill   b complex vitamins tablet Take 1 tablet by mouth daily.     COD LIVER OIL PO Take 1 tablet by mouth daily.      lidocaine-prilocaine (EMLA) cream Apply 1 application topically as needed. 30 g 0    Multiple Vitamin (MULTIVITAMIN) tablet Take 1 tablet by mouth daily.     nicotine (NICODERM CQ) 21 mg/24hr patch Place 1 patch (21 mg total) onto the skin daily. 28 patch 0   oxymetazoline (AFRIN) 0.05 % nasal spray Place 2 sprays into the nose 2 (two) times daily as needed for congestion.     Polyethyl Glycol-Propyl Glycol (SYSTANE) 0.4-0.3 % SOLN Apply 1 drop to eye 2 (two) times daily as needed (dry eyes).     prochlorperazine (COMPAZINE) 10 MG tablet Take 1 tablet (10 mg total) by mouth every 6 (six) hours as needed for nausea or vomiting. 30 tablet 0   sodium chloride (OCEAN) 0.65 % nasal spray Place 1 spray into the nose 2 (two) times daily as needed for congestion.     vitamin C (ASCORBIC ACID) 500 MG tablet Take 500 mg by mouth daily.     vitamin E 400 UNIT capsule Take 400 Units by mouth daily.     traMADol (ULTRAM) 50 MG tablet TAKE 1 TABLET BY MOUTH EVERY 6 TO 8 HOURS AS NEEDED FOR PAIN     No current facility-administered medications for this visit.     SURGICAL HISTORY:  Past Surgical History:  Procedure Laterality Date   CATARACT EXTRACTION W/ INTRAOCULAR LENS  IMPLANT, BILATERAL  2012   DECOMPRESSIVE LUMBAR LAMINECTOMY LEVEL 2 N/A 07/29/2013   Procedure: LUMBAR LAMINECTOMY, DECOMPRESSION LUMBAR THREE TO FOUR, FOUR TO FIVE microdiscectomy l3,4 right;  Surgeon: Tobi Bastos, MD;  Location: [facility];  Service: Orthopedics;  Laterality: N/A;   I & D SUPERIOR RIGHT SHOULDER AND CLOSURE WOUND  01-10-2011   S/P ROTATOR CUFF REPAIR   IR IMAGING GUIDED PORT INSERTION  04/09/2019   LUMBAR LAMINECTOMY  1970'S   LUMBAR LAMINECTOMY/DECOMPRESSION MICRODISCECTOMY N/A 06/27/2016   Procedure: L1 - L2 DISCECTOMY;  Surgeon: Melina Schools, MD;  Location: [facility];  Service: Orthopedics;  Laterality: N/A;   LUMBAR SPINE SURGERY  1983   RIGHT SHOULDER ARTHROSCOPY/ OPEN DISTAL CLAVICLE RESECTION/ SAD/ OPEN ROTATOR CUFF REPAIR  11-28-2010   SHOULDER OPEN ROTATOR CUFF REPAIR  12/19/2011     Procedure: ROTATOR CUFF REPAIR SHOULDER OPEN;  Surgeon: Magnus Sinning, MD;  Location: [facility];  Service: Orthopedics;  Laterality: Right;  RIGHT RECURRENT OPEN REPAIR OF THE ROTATOR CUFF WITH TISSUE MEND GRAFTANTERIOR CHROMIOECTOMY   VAGINAL HYSTERECTOMY  1979    REVIEW OF SYSTEMS:  A comprehensive review of systems was negative except for: Constitutional: positive for fatigue Integument/breast: positive for rash Musculoskeletal: positive for muscle weakness   PHYSICAL EXAMINATION: General appearance: alert, cooperative and no distress Head: Normocephalic, without obvious abnormality, atraumatic Neck: no adenopathy, no JVD, supple, symmetrical, trachea midline and thyroid not enlarged, symmetric, no tenderness/mass/nodules Lymph nodes: Cervical, supraclavicular, and axillary nodes normal. Resp: clear to auscultation bilaterally Back: symmetric, no curvature. ROM normal. No CVA tenderness. Cardio: regular rate and rhythm, S1, S2 normal, no murmur, click, rub or gallop GI: soft, non-tender; bowel sounds normal; no masses,  no organomegaly Extremities: extremities normal, atraumatic, no cyanosis or edema       ECOG PERFORMANCE STATUS: 1 - Symptomatic but completely ambulatory  Blood pressure (!) 125/104, pulse 72, temperature 98.9 F (37.2 C), temperature source Oral, resp. rate (!) 22, height 5\' 2"  (1.575 m), weight 136 lb 14.4 oz (62.1 kg), SpO2 98 %.  LABORATORY DATA: Lab Results  Component Value Date   WBC 9.4 05/18/2019   HGB 10.7 (L) 05/18/2019   HCT 32.2 (L) 05/18/2019   MCV 96.4 05/18/2019   PLT 219 05/18/2019      Chemistry      Component Value Date/Time   NA 138 05/11/2019 1214   K 4.1 05/11/2019 1214   CL 106 05/11/2019 1214   CO2 24 05/11/2019 1214   BUN 17 05/11/2019 1214   CREATININE 0.76 05/11/2019 1214      Component Value Date/Time   CALCIUM 9.3 05/11/2019 1214   ALKPHOS 138 (H) 05/11/2019 1214   AST 26 05/11/2019 1214   ALT  26 05/11/2019 1214   BILITOT <0.2 (L) 05/11/2019 1214       RADIOGRAPHIC STUDIES: Ct Chest W Contrast  Result Date: 04/20/2019 CLINICAL DATA:  Small-cell lung cancer. EXAM: CT CHEST, ABDOMEN, AND PELVIS WITH CONTRAST TECHNIQUE: Multidetector CT imaging of the chest, abdomen and pelvis was performed following the standard protocol during bolus administration of intravenous contrast. CONTRAST:  182mL OMNIPAQUE IOHEXOL 300 MG/ML  SOLN COMPARISON:  PET-CT 02/24/2019. FINDINGS: CT CHEST FINDINGS Cardiovascular: The heart size is normal. No substantial pericardial effusion. Coronary artery calcification is evident. Atherosclerotic calcification is noted in the wall of the thoracic aorta. Right Port-A-Cath tip positioned in the upper right atrium. Mediastinum/Nodes: 2.1 cm short axis precarinal lymph node measured previously has decreased in the interval measuring 8 mm short axis today. No hilar lymphadenopathy. The esophagus has normal imaging features. There is no axillary lymphadenopathy. Lungs/Pleura: Centrilobular emphsyema noted. Right upper lobe pulmonary nodule seen  on previous PET-CT have resolved in the interval. Subpleural reticulation in the lungs is compatible with underlying fibrotic lung disease. No new suspicious pulmonary nodule or mass. No pleural effusion. Musculoskeletal: No worrisome lytic or sclerotic osseous abnormality. CT ABDOMEN PELVIS FINDINGS Hepatobiliary: No suspicious focal abnormality within the liver parenchyma. There is no evidence for gallstones, gallbladder wall thickening, or pericholecystic fluid. No intrahepatic or extrahepatic biliary dilation. Pancreas: No focal mass lesion. No dilatation of the main duct. No intraparenchymal cyst. No peripancreatic edema. Spleen: No splenomegaly. No focal mass lesion. Adrenals/Urinary Tract: No adrenal nodule or mass. Similar appearance right hydronephrosis. Left kidney unremarkable. No evidence for hydroureter. The urinary bladder appears  normal for the degree of distention. Stomach/Bowel: Stomach is unremarkable. No gastric wall thickening. No evidence of outlet obstruction. Duodenum is normally positioned as is the ligament of Treitz. No small bowel wall thickening. No small bowel dilatation. The terminal ileum is normal. The appendix is normal. No gross colonic mass. No colonic wall thickening. Vascular/Lymphatic: There is abdominal aortic atherosclerosis without aneurysm. The lesion identified previously in the porta hepatis has decreased in the interval measuring 2.8 x 2.9 cm today compared to 5.2 x 5.4 cm previously. The retrocaval lymph node seen on previous PET-CT has no measurable residual on today's study. There is a 8 mm short axis portal caval node visible on image 60/series 2. No pelvic sidewall lymphadenopathy. Reproductive: The uterus is surgically absent. There is no adnexal mass. Other: No intraperitoneal free fluid. Musculoskeletal: No worrisome lytic or sclerotic osseous abnormality. Advanced degenerative changes noted in the lumbar spine. IMPRESSION: 1. Interval resolution of right upper lobe pulmonary nodule seen to be hypermetabolic on previous PET-CT. The lymphadenopathy in the chest and abdomen has decreased markedly in the interval. 2. Hypermetabolic lesion in the porta hepatis identified on the previous exam has markedly decreased in the interval. 3. No new or progressive findings on today's study. 4. Stable appearance of right hydronephrosis likely accentuated by extrarenal pelvis. Component of UPJ obstruction suspected. 5. 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 6.  Aortic Atherosclerois (ICD10-170.0) 7.  Emphysema. ([id number]) Electronically Signed   By: Misty Stanley M.D.   On: 04/20/2019 16:36   Ct Abdomen Pelvis W Contrast  Result Date: 04/20/2019 CLINICAL DATA:  Small-cell lung cancer. EXAM: CT CHEST, ABDOMEN, AND PELVIS WITH CONTRAST TECHNIQUE: Multidetector CT imaging  of the chest, abdomen and pelvis was performed following the standard protocol during bolus administration of intravenous contrast. CONTRAST:  118mL OMNIPAQUE IOHEXOL 300 MG/ML  SOLN COMPARISON:  PET-CT 02/24/2019. FINDINGS: CT CHEST FINDINGS Cardiovascular: The heart size is normal. No substantial pericardial effusion. Coronary artery calcification is evident. Atherosclerotic calcification is noted in the wall of the thoracic aorta. Right Port-A-Cath tip positioned in the upper right atrium. Mediastinum/Nodes: 2.1 cm short axis precarinal lymph node measured previously has decreased in the interval measuring 8 mm short axis today. No hilar lymphadenopathy. The esophagus has normal imaging features. There is no axillary lymphadenopathy. Lungs/Pleura: Centrilobular emphsyema noted. Right upper lobe pulmonary nodule seen on previous PET-CT have resolved in the interval. Subpleural reticulation in the lungs is compatible with underlying fibrotic lung disease. No new suspicious pulmonary nodule or mass. No pleural effusion. Musculoskeletal: No worrisome lytic or sclerotic osseous abnormality. CT ABDOMEN PELVIS FINDINGS Hepatobiliary: No suspicious focal abnormality within the liver parenchyma. There is no evidence for gallstones, gallbladder wall thickening, or pericholecystic fluid. No intrahepatic or extrahepatic biliary dilation. Pancreas: No focal mass lesion. No dilatation of the  main duct. No intraparenchymal cyst. No peripancreatic edema. Spleen: No splenomegaly. No focal mass lesion. Adrenals/Urinary Tract: No adrenal nodule or mass. Similar appearance right hydronephrosis. Left kidney unremarkable. No evidence for hydroureter. The urinary bladder appears normal for the degree of distention. Stomach/Bowel: Stomach is unremarkable. No gastric wall thickening. No evidence of outlet obstruction. Duodenum is normally positioned as is the ligament of Treitz. No small bowel wall thickening. No small bowel dilatation.  The terminal ileum is normal. The appendix is normal. No gross colonic mass. No colonic wall thickening. Vascular/Lymphatic: There is abdominal aortic atherosclerosis without aneurysm. The lesion identified previously in the porta hepatis has decreased in the interval measuring 2.8 x 2.9 cm today compared to 5.2 x 5.4 cm previously. The retrocaval lymph node seen on previous PET-CT has no measurable residual on today's study. There is a 8 mm short axis portal caval node visible on image 60/series 2. No pelvic sidewall lymphadenopathy. Reproductive: The uterus is surgically absent. There is no adnexal mass. Other: No intraperitoneal free fluid. Musculoskeletal: No worrisome lytic or sclerotic osseous abnormality. Advanced degenerative changes noted in the lumbar spine. IMPRESSION: 1. Interval resolution of right upper lobe pulmonary nodule seen to be hypermetabolic on previous PET-CT. The lymphadenopathy in the chest and abdomen has decreased markedly in the interval. 2. Hypermetabolic lesion in the porta hepatis identified on the previous exam has markedly decreased in the interval. 3. No new or progressive findings on today's study. 4. Stable appearance of right hydronephrosis likely accentuated by extrarenal pelvis. Component of UPJ obstruction suspected. 5. 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 6.  Aortic Atherosclerois (ICD10-170.0) 7.  Emphysema. ([id number]) Electronically Signed   By: Misty Stanley M.D.   On: 04/20/2019 16:36    ASSESSMENT AND PLAN: This is a very pleasant 78 years old white female with extensive stage small cell lung cancer and she is currently undergoing treatment with carboplatin, etoposide and Imfinzi status post 3 cycles.  The patient has been tolerating this treatment well with no concerning complaints except for fatigue. I recommended for her to proceed with cycle #4 today as planned. I will see her back for follow-up visit in 3 weeks  for evaluation after repeating CT scan of the chest, abdomen and pelvis for restaging of her disease. For the skin rash and itching, she will continue to apply the hydrocortisone cream and use Benadryl as needed. The patient was advised to call immediately if she has any concerning symptoms in the interval. The patient voices understanding of current disease status and treatment options and is in agreement with the current care plan.  All questions were answered. The patient knows to call the clinic with any problems, questions or concerns. We can certainly see the patient much sooner if necessary.  I spent 10 minutes counseling the patient face to face. The total time spent in the appointment was 15 minutes.  Disclaimer: This note was dictated with voice recognition software. Similar sounding words can inadvertently be transcribed and may not be corrected upon review.

## 2019-05-18 NOTE — Patient Instructions (Signed)
[facility] Discharge Instructions for Patients Receiving Chemotherapy  Today you received the following chemotherapy agents: Durvalumab (Imfinzi), Carboplatin (Paraplatin), Etoposide (Vepesid, VP-16)  To help prevent nausea and vomiting after your treatment, we encourage you to take your nausea medication as directed.   If you develop nausea and vomiting that is not controlled by your nausea medication, call the clinic.   BELOW ARE SYMPTOMS THAT SHOULD BE REPORTED IMMEDIATELY:  *FEVER GREATER THAN 100.5 F  *CHILLS WITH OR WITHOUT FEVER  NAUSEA AND VOMITING THAT IS NOT CONTROLLED WITH YOUR NAUSEA MEDICATION  *UNUSUAL SHORTNESS OF BREATH  *UNUSUAL BRUISING OR BLEEDING  TENDERNESS IN MOUTH AND THROAT WITH OR WITHOUT PRESENCE OF ULCERS  *URINARY PROBLEMS  *BOWEL PROBLEMS  UNUSUAL RASH Items with * indicate a potential emergency and should be followed up as soon as possible.  Feel free to call the clinic should you have any questions or concerns. The clinic phone number is [phone].  Please show the [facility] at check-in to the Emergency Department and triage nurse.  Coronavirus (COVID-19) Are you at risk?  Are you at risk for the Coronavirus (COVID-19)?  To be considered HIGH RISK for Coronavirus (COVID-19), you have to meet the following criteria:  . Traveled to [place], [place], [place], [place] or [place]; or in the [place] to [place], [place], [place], or [place]; and have fever, cough, and shortness of breath within the last 2 weeks of travel OR . Been in close contact with a person diagnosed with COVID-19 within the last 2 weeks and have fever, cough, and shortness of breath . IF YOU DO NOT MEET THESE CRITERIA, YOU ARE CONSIDERED LOW RISK FOR COVID-19.  What to do if you are HIGH RISK for COVID-19?  Marland Kitchen If you are having a medical emergency, call 911. . Seek medical care right away. Before you go to a doctor's office, urgent care  or emergency department, call ahead and tell them about your recent travel, contact with someone diagnosed with COVID-19, and your symptoms. You should receive instructions from your physician's office regarding next steps of care.  . When you arrive at healthcare provider, tell the healthcare staff immediately you have returned from visiting [place], [place], [place], [place] or [place]; or traveled in the [place] to [place], [place], [place], or [place]; in the last two weeks or you have been in close contact with a person diagnosed with COVID-19 in the last 2 weeks.   . Tell the health care staff about your symptoms: fever, cough and shortness of breath. . After you have been seen by a medical provider, you will be either: o Tested for (COVID-19) and discharged home on quarantine except to seek medical care if symptoms worsen, and asked to  - Stay home and avoid contact with others until you get your results (4-5 days)  - Avoid travel on public transportation if possible (such as bus, train, or airplane) or o Sent to the Emergency Department by EMS for evaluation, COVID-19 testing, and possible admission depending on your condition and test results.  What to do if you are LOW RISK for COVID-19?  Reduce your risk of any infection by using the same precautions used for avoiding the common cold or flu:  Marland Kitchen Wash your hands often with soap and warm water for at least 20 seconds.  If soap and water are not readily available, use an alcohol-based hand sanitizer with at least 60% alcohol.  Marland Kitchen  If coughing or sneezing, cover your mouth and nose by coughing or sneezing into the elbow areas of your shirt or coat, into a tissue or into your sleeve (not your hands). . Avoid shaking hands with others and consider head nods or verbal greetings only. . Avoid touching your eyes, nose, or mouth with unwashed hands.  . Avoid close contact with people who are sick. . Avoid places or events with large  numbers of people in one location, like concerts or sporting events. . Carefully consider travel plans you have or are making. . If you are planning any travel outside or inside the [place], visit the CDC's Travelers' Health webpage for the latest health notices. . If you have some symptoms but not all symptoms, continue to monitor at home and seek medical attention if your symptoms worsen. . If you are having a medical emergency, call 911.   [facility] / e-Visit: [email or web address]         [facility]: [facility]: [phone]                   [facility]: [phone]

## 2019-05-18 NOTE — Telephone Encounter (Signed)
Scheduled appt per 8/10 los - gave patient AVS and calender per los.

## 2019-05-19 ENCOUNTER — Inpatient Hospital Stay: Payer: Federal, State, Local not specified - PPO

## 2019-05-19 ENCOUNTER — Other Ambulatory Visit: Payer: Self-pay

## 2019-05-19 VITALS — BP 119/59 | HR 75 | Temp 98.5°F | Resp 20

## 2019-05-19 DIAGNOSIS — C3411 Malignant neoplasm of upper lobe, right bronchus or lung: Secondary | ICD-10-CM

## 2019-05-19 MED ORDER — SODIUM CHLORIDE 0.9 % IV SOLN
100.0000 mg/m2 | Freq: Once | INTRAVENOUS | Status: AC
  Administered 2019-05-19: 160 mg via INTRAVENOUS
  Filled 2019-05-19: qty 8

## 2019-05-19 MED ORDER — DEXAMETHASONE SODIUM PHOSPHATE 10 MG/ML IJ SOLN
10.0000 mg | Freq: Once | INTRAMUSCULAR | Status: AC
  Administered 2019-05-19: 10 mg via INTRAVENOUS

## 2019-05-19 MED ORDER — SODIUM CHLORIDE 0.9% FLUSH
10.0000 mL | INTRAVENOUS | Status: DC | PRN
  Administered 2019-05-19: 10 mL
  Filled 2019-05-19: qty 10

## 2019-05-19 MED ORDER — DEXAMETHASONE SODIUM PHOSPHATE 10 MG/ML IJ SOLN
INTRAMUSCULAR | Status: AC
  Filled 2019-05-19: qty 1

## 2019-05-19 MED ORDER — HEPARIN SOD (PORK) LOCK FLUSH 100 UNIT/ML IV SOLN
500.0000 [IU] | Freq: Once | INTRAVENOUS | Status: AC | PRN
  Administered 2019-05-19: 500 [IU]
  Filled 2019-05-19: qty 5

## 2019-05-19 MED ORDER — SODIUM CHLORIDE 0.9 % IV SOLN
Freq: Once | INTRAVENOUS | Status: AC
  Administered 2019-05-19: 14:00:00 via INTRAVENOUS
  Filled 2019-05-19: qty 250

## 2019-05-19 NOTE — Patient Instructions (Signed)
[facility] Discharge Instructions for Patients Receiving Chemotherapy  Today you received the following chemotherapy agents: Etoposide.  To help prevent nausea and vomiting after your treatment, we encourage you to take your nausea medication as directed.   If you develop nausea and vomiting that is not controlled by your nausea medication, call the clinic.   BELOW ARE SYMPTOMS THAT SHOULD BE REPORTED IMMEDIATELY:  *FEVER GREATER THAN 100.5 F  *CHILLS WITH OR WITHOUT FEVER  NAUSEA AND VOMITING THAT IS NOT CONTROLLED WITH YOUR NAUSEA MEDICATION  *UNUSUAL SHORTNESS OF BREATH  *UNUSUAL BRUISING OR BLEEDING  TENDERNESS IN MOUTH AND THROAT WITH OR WITHOUT PRESENCE OF ULCERS  *URINARY PROBLEMS  *BOWEL PROBLEMS  UNUSUAL RASH Items with * indicate a potential emergency and should be followed up as soon as possible.  Feel free to call the clinic should you have any questions or concerns. The clinic phone number is [phone].  Please show the [facility] at check-in to the Emergency Department and triage nurse.

## 2019-05-20 ENCOUNTER — Inpatient Hospital Stay: Payer: Federal, State, Local not specified - PPO

## 2019-05-20 ENCOUNTER — Other Ambulatory Visit: Payer: Self-pay

## 2019-05-20 VITALS — BP 145/63 | HR 63 | Temp 98.5°F | Resp 18

## 2019-05-20 DIAGNOSIS — C3411 Malignant neoplasm of upper lobe, right bronchus or lung: Secondary | ICD-10-CM | POA: Diagnosis not present

## 2019-05-20 MED ORDER — DEXAMETHASONE SODIUM PHOSPHATE 10 MG/ML IJ SOLN
10.0000 mg | Freq: Once | INTRAMUSCULAR | Status: AC
  Administered 2019-05-20: 10 mg via INTRAVENOUS

## 2019-05-20 MED ORDER — DEXAMETHASONE SODIUM PHOSPHATE 10 MG/ML IJ SOLN
INTRAMUSCULAR | Status: AC
  Filled 2019-05-20: qty 1

## 2019-05-20 MED ORDER — SODIUM CHLORIDE 0.9 % IV SOLN
Freq: Once | INTRAVENOUS | Status: AC
  Administered 2019-05-20: 14:00:00 via INTRAVENOUS
  Filled 2019-05-20: qty 250

## 2019-05-20 MED ORDER — SODIUM CHLORIDE 0.9 % IV SOLN
100.0000 mg/m2 | Freq: Once | INTRAVENOUS | Status: AC
  Administered 2019-05-20: 160 mg via INTRAVENOUS
  Filled 2019-05-20: qty 8

## 2019-05-20 MED ORDER — SODIUM CHLORIDE 0.9% FLUSH
10.0000 mL | INTRAVENOUS | Status: DC | PRN
  Administered 2019-05-20: 10 mL
  Filled 2019-05-20: qty 10

## 2019-05-20 MED ORDER — HEPARIN SOD (PORK) LOCK FLUSH 100 UNIT/ML IV SOLN
500.0000 [IU] | Freq: Once | INTRAVENOUS | Status: AC | PRN
  Administered 2019-05-20: 500 [IU]
  Filled 2019-05-20: qty 5

## 2019-05-20 NOTE — Patient Instructions (Signed)
[facility] Discharge Instructions for Patients Receiving Chemotherapy  Today you received the following chemotherapy agents Etoposide (VEPESID).  To help prevent nausea and vomiting after your treatment, we encourage you to take your nausea medication as prescribed.   If you develop nausea and vomiting that is not controlled by your nausea medication, call the clinic.   BELOW ARE SYMPTOMS THAT SHOULD BE REPORTED IMMEDIATELY:  *FEVER GREATER THAN 100.5 F  *CHILLS WITH OR WITHOUT FEVER  NAUSEA AND VOMITING THAT IS NOT CONTROLLED WITH YOUR NAUSEA MEDICATION  *UNUSUAL SHORTNESS OF BREATH  *UNUSUAL BRUISING OR BLEEDING  TENDERNESS IN MOUTH AND THROAT WITH OR WITHOUT PRESENCE OF ULCERS  *URINARY PROBLEMS  *BOWEL PROBLEMS  UNUSUAL RASH Items with * indicate a potential emergency and should be followed up as soon as possible.  Feel free to call the clinic should you have any questions or concerns. The clinic phone number is [phone].  Please show the [facility] at check-in to the Emergency Department and triage nurse.  Coronavirus (COVID-19) Are you at risk?  Are you at risk for the Coronavirus (COVID-19)?  To be considered HIGH RISK for Coronavirus (COVID-19), you have to meet the following criteria:  . Traveled to [place], [place], [place], [place] or [place]; or in the [place] to [place], [place], [place], or [place]; and have fever, cough, and shortness of breath within the last 2 weeks of travel OR . Been in close contact with a person diagnosed with COVID-19 within the last 2 weeks and have fever, cough, and shortness of breath . IF YOU DO NOT MEET THESE CRITERIA, YOU ARE CONSIDERED LOW RISK FOR COVID-19.  What to do if you are HIGH RISK for COVID-19?  Marland Kitchen If you are having a medical emergency, call 911. . Seek medical care right away. Before you go to a doctor's office, urgent care or emergency department, call ahead and tell them  about your recent travel, contact with someone diagnosed with COVID-19, and your symptoms. You should receive instructions from your physician's office regarding next steps of care.  . When you arrive at healthcare provider, tell the healthcare staff immediately you have returned from visiting [place], [place], [place], [place] or [place]; or traveled in the [place] to [place], [place], [place], or [place]; in the last two weeks or you have been in close contact with a person diagnosed with COVID-19 in the last 2 weeks.   . Tell the health care staff about your symptoms: fever, cough and shortness of breath. . After you have been seen by a medical provider, you will be either: o Tested for (COVID-19) and discharged home on quarantine except to seek medical care if symptoms worsen, and asked to  - Stay home and avoid contact with others until you get your results (4-5 days)  - Avoid travel on public transportation if possible (such as bus, train, or airplane) or o Sent to the Emergency Department by EMS for evaluation, COVID-19 testing, and possible admission depending on your condition and test results.  What to do if you are LOW RISK for COVID-19?  Reduce your risk of any infection by using the same precautions used for avoiding the common cold or flu:  Marland Kitchen Wash your hands often with soap and warm water for at least 20 seconds.  If soap and water are not readily available, use an alcohol-based hand sanitizer with at least 60% alcohol.  . If coughing or  sneezing, cover your mouth and nose by coughing or sneezing into the elbow areas of your shirt or coat, into a tissue or into your sleeve (not your hands). . Avoid shaking hands with others and consider head nods or verbal greetings only. . Avoid touching your eyes, nose, or mouth with unwashed hands.  . Avoid close contact with people who are sick. . Avoid places or events with large numbers of people in one location, like concerts or  sporting events. . Carefully consider travel plans you have or are making. . If you are planning any travel outside or inside the [place], visit the CDC's Travelers' Health webpage for the latest health notices. . If you have some symptoms but not all symptoms, continue to monitor at home and seek medical attention if your symptoms worsen. . If you are having a medical emergency, call 911.   [place] / e-Visit: [email or web address]         [facility]: [facility]: [phone]                   [facility]: [phone]

## 2019-05-21 DIAGNOSIS — C7951 Secondary malignant neoplasm of bone: Secondary | ICD-10-CM | POA: Insufficient documentation

## 2019-05-21 NOTE — Progress Notes (Signed)
  Radiation Oncology         [phone] ________________________________  Name: Brittany Marks MRN: [id number]  Date: 03/27/2019  DOB: 1941-05-14  SIMULATION AND TREATMENT PLANNING NOTE  DIAGNOSIS:     ICD-10-CM   1. 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  Identity was confirmed.  All relevant records and images related to the planned course of therapy were reviewed.   Written consent to proceed with treatment was confirmed which was freely given after reviewing the details related to the planned course of therapy had been reviewed with the patient.  Then, the patient was set-up in a stable reproducible  supine position for radiation therapy.  CT images were obtained.  Surface markings were placed.    Medically necessary complex treatment device(s) for immobilization:   1.  Vac-lock bag 2.  accuform device.   The CT images were loaded into the planning software.  Then the target and avoidance structures were contoured.  Treatment planning then occurred.  The radiation prescription was entered and confirmed.  A total of 2 complex treatment devices were fabricated which relate to the designed radiation treatment fields. Each of these customized fields/ complex treatment devices will be used on a daily basis during the radiation course. I have requested : Isodose Plan.   PLAN:  The patient will receive 30 Gy in 10 fractions.  ________________________________   Jodelle Gross, MD, PhD

## 2019-05-22 ENCOUNTER — Inpatient Hospital Stay: Payer: Federal, State, Local not specified - PPO

## 2019-05-22 ENCOUNTER — Other Ambulatory Visit: Payer: Self-pay

## 2019-05-22 VITALS — BP 116/53 | HR 70 | Temp 97.3°F | Resp 16

## 2019-05-22 DIAGNOSIS — C3411 Malignant neoplasm of upper lobe, right bronchus or lung: Secondary | ICD-10-CM | POA: Diagnosis not present

## 2019-05-22 MED ORDER — PEGFILGRASTIM-CBQV 6 MG/0.6ML ~~LOC~~ SOSY
6.0000 mg | PREFILLED_SYRINGE | Freq: Once | SUBCUTANEOUS | Status: AC
  Administered 2019-05-22: 6 mg via SUBCUTANEOUS

## 2019-05-22 MED ORDER — PEGFILGRASTIM-CBQV 6 MG/0.6ML ~~LOC~~ SOSY
PREFILLED_SYRINGE | SUBCUTANEOUS | Status: AC
  Filled 2019-05-22: qty 0.6

## 2019-05-22 NOTE — Patient Instructions (Signed)

## 2019-05-25 ENCOUNTER — Inpatient Hospital Stay: Payer: Federal, State, Local not specified - PPO

## 2019-05-25 ENCOUNTER — Other Ambulatory Visit: Payer: Self-pay

## 2019-05-25 DIAGNOSIS — C3411 Malignant neoplasm of upper lobe, right bronchus or lung: Secondary | ICD-10-CM | POA: Diagnosis not present

## 2019-05-25 DIAGNOSIS — Z95828 Presence of other vascular implants and grafts: Secondary | ICD-10-CM | POA: Insufficient documentation

## 2019-05-25 LAB — CBC WITH DIFFERENTIAL (CANCER CENTER ONLY)
Abs Immature Granulocytes: 0.3 10*3/uL — ABNORMAL HIGH (ref 0.00–0.07)
Band Neutrophils: 5 %
Basophils Absolute: 0 10*3/uL (ref 0.0–0.1)
Basophils Relative: 0 %
Eosinophils Absolute: 0.3 10*3/uL (ref 0.0–0.5)
Eosinophils Relative: 1 %
HCT: 27.2 % — ABNORMAL LOW (ref 36.0–46.0)
Hemoglobin: 9.1 g/dL — ABNORMAL LOW (ref 12.0–15.0)
Lymphocytes Relative: 2 %
Lymphs Abs: 0.7 10*3/uL (ref 0.7–4.0)
MCH: 32.6 pg (ref 26.0–34.0)
MCHC: 33.5 g/dL (ref 30.0–36.0)
MCV: 97.5 fL (ref 80.0–100.0)
Metamyelocytes Relative: 1 %
Monocytes Absolute: 0.3 10*3/uL (ref 0.1–1.0)
Monocytes Relative: 1 %
Neutro Abs: 31.4 10*3/uL — ABNORMAL HIGH (ref 1.7–17.7)
Neutrophils Relative %: 90 %
Platelet Count: 182 10*3/uL (ref 150–400)
RBC: 2.79 MIL/uL — ABNORMAL LOW (ref 3.87–5.11)
RDW: 16 % — ABNORMAL HIGH (ref 11.5–15.5)
WBC Count: 33.1 10*3/uL — ABNORMAL HIGH (ref 4.0–10.5)
nRBC: 0 % (ref 0.0–0.2)

## 2019-05-25 LAB — CMP (CANCER CENTER ONLY)
ALT: 17 U/L (ref 0–44)
AST: 18 U/L (ref 15–41)
Albumin: 3.6 g/dL (ref 3.5–5.0)
Alkaline Phosphatase: 125 U/L (ref 38–126)
Anion gap: 8 (ref 5–15)
BUN: 21 mg/dL (ref 8–23)
CO2: 24 mmol/L (ref 22–32)
Calcium: 9.1 mg/dL (ref 8.9–10.3)
Chloride: 102 mmol/L (ref 98–111)
Creatinine: 0.69 mg/dL (ref 0.44–1.00)
GFR, Est AFR Am: >60 mL/min (ref >60)
GFR, Estimated: >60 mL/min (ref >60)
Glucose, Bld: 95 mg/dL (ref 70–99)
Potassium: 4.3 mmol/L (ref 3.5–5.1)
Sodium: 134 mmol/L — ABNORMAL LOW (ref 135–145)
Total Bilirubin: 0.6 mg/dL (ref 0.3–1.2)
Total Protein: 6.5 g/dL (ref 6.5–8.1)

## 2019-05-25 MED ORDER — SODIUM CHLORIDE 0.9% FLUSH
10.0000 mL | INTRAVENOUS | Status: AC | PRN
  Filled 2019-05-25: qty 10

## 2019-05-25 MED ORDER — HEPARIN SOD (PORK) LOCK FLUSH 100 UNIT/ML IV SOLN
500.0000 [IU] | Freq: Once | INTRAVENOUS | Status: AC | PRN
  Filled 2019-05-25: qty 5

## 2019-06-01 ENCOUNTER — Other Ambulatory Visit: Payer: Self-pay

## 2019-06-01 ENCOUNTER — Inpatient Hospital Stay: Payer: Federal, State, Local not specified - PPO

## 2019-06-01 DIAGNOSIS — C3411 Malignant neoplasm of upper lobe, right bronchus or lung: Secondary | ICD-10-CM

## 2019-06-01 DIAGNOSIS — Z95828 Presence of other vascular implants and grafts: Secondary | ICD-10-CM

## 2019-06-01 LAB — CMP (CANCER CENTER ONLY)
ALT: 15 U/L (ref 0–44)
AST: 22 U/L (ref 15–41)
Albumin: 3.7 g/dL (ref 3.5–5.0)
Alkaline Phosphatase: 133 U/L — ABNORMAL HIGH (ref 38–126)
Anion gap: 8 (ref 5–15)
BUN: 14 mg/dL (ref 8–23)
CO2: 24 mmol/L (ref 22–32)
Calcium: 8.8 mg/dL — ABNORMAL LOW (ref 8.9–10.3)
Chloride: 106 mmol/L (ref 98–111)
Creatinine: 0.73 mg/dL (ref 0.44–1.00)
GFR, Est AFR Am: >60 mL/min (ref >60)
GFR, Estimated: >60 mL/min (ref >60)
Glucose, Bld: 105 mg/dL — ABNORMAL HIGH (ref 70–99)
Potassium: 4 mmol/L (ref 3.5–5.1)
Sodium: 138 mmol/L (ref 135–145)
Total Bilirubin: 0.3 mg/dL (ref 0.3–1.2)
Total Protein: 6.6 g/dL (ref 6.5–8.1)

## 2019-06-01 LAB — CBC WITH DIFFERENTIAL (CANCER CENTER ONLY)
Abs Immature Granulocytes: 1.71 10*3/uL — ABNORMAL HIGH (ref 0.00–0.07)
Basophils Absolute: 0 10*3/uL (ref 0.0–0.1)
Basophils Relative: 0 %
Eosinophils Absolute: 0.2 10*3/uL (ref 0.0–0.5)
Eosinophils Relative: 1 %
HCT: 30.1 % — ABNORMAL LOW (ref 36.0–46.0)
Hemoglobin: 9.8 g/dL — ABNORMAL LOW (ref 12.0–15.0)
Immature Granulocytes: 9 %
Lymphocytes Relative: 13 %
Lymphs Abs: 2.6 10*3/uL (ref 0.7–4.0)
MCH: 32.1 pg (ref 26.0–34.0)
MCHC: 32.6 g/dL (ref 30.0–36.0)
MCV: 98.7 fL (ref 80.0–100.0)
Monocytes Absolute: 1.6 10*3/uL — ABNORMAL HIGH (ref 0.1–1.0)
Monocytes Relative: 8 %
Neutro Abs: 14 10*3/uL — ABNORMAL HIGH (ref 1.7–7.7)
Neutrophils Relative %: 69 %
Platelet Count: 132 10*3/uL — ABNORMAL LOW (ref 150–400)
RBC: 3.05 MIL/uL — ABNORMAL LOW (ref 3.87–5.11)
RDW: 17.3 % — ABNORMAL HIGH (ref 11.5–15.5)
WBC Count: 20.1 10*3/uL — ABNORMAL HIGH (ref 4.0–10.5)
nRBC: 0.2 % (ref 0.0–0.2)

## 2019-06-01 MED ORDER — HEPARIN SOD (PORK) LOCK FLUSH 100 UNIT/ML IV SOLN
500.0000 [IU] | Freq: Once | INTRAVENOUS | Status: AC | PRN
  Administered 2019-06-01: 500 [IU]
  Filled 2019-06-01: qty 5

## 2019-06-01 MED ORDER — SODIUM CHLORIDE 0.9% FLUSH
10.0000 mL | INTRAVENOUS | Status: DC | PRN
  Administered 2019-06-01: 10 mL
  Filled 2019-06-01: qty 10

## 2019-06-01 NOTE — Patient Instructions (Signed)

## 2019-06-02 ENCOUNTER — Ambulatory Visit (HOSPITAL_COMMUNITY)
Admission: RE | Admit: 2019-06-02 | Discharge: 2019-06-02 | Disposition: A | Discharge status: Home / Self Care | Payer: Federal, State, Local not specified - PPO | Source: Ambulatory Visit | Attending: Internal Medicine | Admitting: Internal Medicine

## 2019-06-02 DIAGNOSIS — C3411 Malignant neoplasm of upper lobe, right bronchus or lung: Secondary | ICD-10-CM | POA: Diagnosis present

## 2019-06-02 MED ORDER — SODIUM CHLORIDE (PF) 0.9 % IJ SOLN
INTRAMUSCULAR | Status: AC
  Filled 2019-06-02: qty 50

## 2019-06-02 MED ORDER — HEPARIN SOD (PORK) LOCK FLUSH 100 UNIT/ML IV SOLN
500.0000 [IU] | Freq: Once | INTRAVENOUS | Status: AC
  Administered 2019-06-02: 500 [IU] via INTRAVENOUS

## 2019-06-02 MED ORDER — HEPARIN SOD (PORK) LOCK FLUSH 100 UNIT/ML IV SOLN
INTRAVENOUS | Status: AC
  Filled 2019-06-02: qty 5

## 2019-06-02 MED ORDER — IOHEXOL 300 MG/ML  SOLN
100.0000 mL | Freq: Once | INTRAMUSCULAR | Status: AC | PRN
  Administered 2019-06-02: 100 mL via INTRAVENOUS

## 2019-06-08 ENCOUNTER — Telehealth: Payer: Self-pay | Admitting: Internal Medicine

## 2019-06-08 ENCOUNTER — Other Ambulatory Visit: Payer: Self-pay

## 2019-06-08 ENCOUNTER — Telehealth: Payer: Self-pay | Admitting: Medical Oncology

## 2019-06-08 ENCOUNTER — Inpatient Hospital Stay: Payer: Federal, State, Local not specified - PPO

## 2019-06-08 ENCOUNTER — Inpatient Hospital Stay (HOSPITAL_BASED_OUTPATIENT_CLINIC_OR_DEPARTMENT_OTHER): Payer: Federal, State, Local not specified - PPO | Admitting: Internal Medicine

## 2019-06-08 ENCOUNTER — Encounter: Payer: Self-pay | Admitting: Internal Medicine

## 2019-06-08 VITALS — BP 133/48 | HR 73 | Temp 98.7°F | Resp 18 | Ht 62.0 in | Wt 132.2 lb

## 2019-06-08 DIAGNOSIS — Z95828 Presence of other vascular implants and grafts: Secondary | ICD-10-CM

## 2019-06-08 DIAGNOSIS — Z1211 Encounter for screening for malignant neoplasm of colon: Secondary | ICD-10-CM

## 2019-06-08 DIAGNOSIS — Z5112 Encounter for antineoplastic immunotherapy: Secondary | ICD-10-CM

## 2019-06-08 DIAGNOSIS — C3411 Malignant neoplasm of upper lobe, right bronchus or lung: Secondary | ICD-10-CM | POA: Diagnosis not present

## 2019-06-08 DIAGNOSIS — R937 Abnormal findings on diagnostic imaging of other parts of musculoskeletal system: Secondary | ICD-10-CM

## 2019-06-08 LAB — COMPREHENSIVE METABOLIC PANEL
ALT: 13 U/L (ref 0–44)
AST: 18 U/L (ref 15–41)
Albumin: 3.6 g/dL (ref 3.5–5.0)
Alkaline Phosphatase: 105 U/L (ref 38–126)
Anion gap: 11 (ref 5–15)
BUN: 21 mg/dL (ref 8–23)
CO2: 21 mmol/L — ABNORMAL LOW (ref 22–32)
Calcium: 9 mg/dL (ref 8.9–10.3)
Chloride: 107 mmol/L (ref 98–111)
Creatinine, Ser: 0.79 mg/dL (ref 0.44–1.00)
GFR calc Af Amer: >60 mL/min (ref >60)
GFR calc non Af Amer: >60 mL/min (ref >60)
Glucose, Bld: 125 mg/dL — ABNORMAL HIGH (ref 70–99)
Potassium: 4.1 mmol/L (ref 3.5–5.1)
Sodium: 139 mmol/L (ref 135–145)
Total Bilirubin: 0.3 mg/dL (ref 0.3–1.2)
Total Protein: 6.7 g/dL (ref 6.5–8.1)

## 2019-06-08 LAB — CBC WITH DIFFERENTIAL/PLATELET
Abs Immature Granulocytes: 0.06 10*3/uL (ref 0.00–0.07)
Basophils Absolute: 0.1 10*3/uL (ref 0.0–0.1)
Basophils Relative: 1 %
Eosinophils Absolute: 0.1 10*3/uL (ref 0.0–0.5)
Eosinophils Relative: 1 %
HCT: 33.1 % — ABNORMAL LOW (ref 36.0–46.0)
Hemoglobin: 10.8 g/dL — ABNORMAL LOW (ref 12.0–15.0)
Immature Granulocytes: 1 %
Lymphocytes Relative: 15 %
Lymphs Abs: 1.4 10*3/uL (ref 0.7–4.0)
MCH: 32.5 pg (ref 26.0–34.0)
MCHC: 32.6 g/dL (ref 30.0–36.0)
MCV: 99.7 fL (ref 80.0–100.0)
Monocytes Absolute: 1 10*3/uL (ref 0.1–1.0)
Monocytes Relative: 10 %
Neutro Abs: 6.9 10*3/uL (ref 1.7–7.7)
Neutrophils Relative %: 72 %
Platelets: 215 10*3/uL (ref 150–400)
RBC: 3.32 MIL/uL — ABNORMAL LOW (ref 3.87–5.11)
RDW: 17.1 % — ABNORMAL HIGH (ref 11.5–15.5)
WBC: 9.5 10*3/uL (ref 4.0–10.5)
nRBC: 0 % (ref 0.0–0.2)

## 2019-06-08 LAB — TSH: TSH: 3.386 u[IU]/mL (ref 0.308–3.960)

## 2019-06-08 MED ORDER — SODIUM CHLORIDE 0.9 % IV SOLN
1500.0000 mg | Freq: Once | INTRAVENOUS | Status: AC
  Administered 2019-06-08: 11:00:00 1500 mg via INTRAVENOUS
  Filled 2019-06-08: qty 30

## 2019-06-08 MED ORDER — SODIUM CHLORIDE 0.9% FLUSH
10.0000 mL | INTRAVENOUS | Status: DC | PRN
  Administered 2019-06-08: 10 mL
  Filled 2019-06-08: qty 10

## 2019-06-08 MED ORDER — HEPARIN SOD (PORK) LOCK FLUSH 100 UNIT/ML IV SOLN
500.0000 [IU] | Freq: Once | INTRAVENOUS | Status: AC | PRN
  Administered 2019-06-08: 12:00:00 500 [IU]
  Filled 2019-06-08: qty 5

## 2019-06-08 MED ORDER — SODIUM CHLORIDE 0.9 % IV SOLN
Freq: Once | INTRAVENOUS | Status: AC
  Administered 2019-06-08: 11:00:00 via INTRAVENOUS
  Filled 2019-06-08: qty 250

## 2019-06-08 NOTE — Patient Instructions (Signed)
[facility] Discharge Instructions for Patients Receiving Chemotherapy  Today you received the following chemotherapy agents: Durvalumab (Imfinzi).  To help prevent nausea and vomiting after your treatment, we encourage you to take your nausea medication as directed.   If you develop nausea and vomiting that is not controlled by your nausea medication, call the clinic.   BELOW ARE SYMPTOMS THAT SHOULD BE REPORTED IMMEDIATELY:  *FEVER GREATER THAN 100.5 F  *CHILLS WITH OR WITHOUT FEVER  NAUSEA AND VOMITING THAT IS NOT CONTROLLED WITH YOUR NAUSEA MEDICATION  *UNUSUAL SHORTNESS OF BREATH  *UNUSUAL BRUISING OR BLEEDING  TENDERNESS IN MOUTH AND THROAT WITH OR WITHOUT PRESENCE OF ULCERS  *URINARY PROBLEMS  *BOWEL PROBLEMS  UNUSUAL RASH Items with * indicate a potential emergency and should be followed up as soon as possible.  Feel free to call the clinic should you have any questions or concerns. The clinic phone number is [phone].  Please show the [facility] at check-in to the Emergency Department and triage nurse.  Coronavirus (COVID-19) Are you at risk?  Are you at risk for the Coronavirus (COVID-19)?  To be considered HIGH RISK for Coronavirus (COVID-19), you have to meet the following criteria:  . Traveled to [place], [place], [place], [place] or [place]; or in the [place] to [place], [place], [place], or [place]; and have fever, cough, and shortness of breath within the last 2 weeks of travel OR . Been in close contact with a person diagnosed with COVID-19 within the last 2 weeks and have fever, cough, and shortness of breath . IF YOU DO NOT MEET THESE CRITERIA, YOU ARE CONSIDERED LOW RISK FOR COVID-19.  What to do if you are HIGH RISK for COVID-19?  Marland Kitchen If you are having a medical emergency, call 911. . Seek medical care right away. Before you go to a doctor's office, urgent care or emergency department, call ahead and tell them  about your recent travel, contact with someone diagnosed with COVID-19, and your symptoms. You should receive instructions from your physician's office regarding next steps of care.  . When you arrive at healthcare provider, tell the healthcare staff immediately you have returned from visiting [place], [place], [place], [place] or [place]; or traveled in the [place] to [place], [place], [place], or [place]; in the last two weeks or you have been in close contact with a person diagnosed with COVID-19 in the last 2 weeks.   . Tell the health care staff about your symptoms: fever, cough and shortness of breath. . After you have been seen by a medical provider, you will be either: o Tested for (COVID-19) and discharged home on quarantine except to seek medical care if symptoms worsen, and asked to  - Stay home and avoid contact with others until you get your results (4-5 days)  - Avoid travel on public transportation if possible (such as bus, train, or airplane) or o Sent to the Emergency Department by EMS for evaluation, COVID-19 testing, and possible admission depending on your condition and test results.  What to do if you are LOW RISK for COVID-19?  Reduce your risk of any infection by using the same precautions used for avoiding the common cold or flu:  Marland Kitchen Wash your hands often with soap and warm water for at least 20 seconds.  If soap and water are not readily available, use an alcohol-based hand sanitizer with at least 60% alcohol.  . If coughing or  sneezing, cover your mouth and nose by coughing or sneezing into the elbow areas of your shirt or coat, into a tissue or into your sleeve (not your hands). . Avoid shaking hands with others and consider head nods or verbal greetings only. . Avoid touching your eyes, nose, or mouth with unwashed hands.  . Avoid close contact with people who are sick. . Avoid places or events with large numbers of people in one location, like concerts or  sporting events. . Carefully consider travel plans you have or are making. . If you are planning any travel outside or inside the [place], visit the CDC's Travelers' Health webpage for the latest health notices. . If you have some symptoms but not all symptoms, continue to monitor at home and seek medical attention if your symptoms worsen. . If you are having a medical emergency, call 911.   [place] / e-Visit: [email or web address]         [facility]: [facility]: [phone]                   [facility]: [phone]

## 2019-06-08 NOTE — Telephone Encounter (Signed)
Dtr asking about results of scan . I told her what was in [facility] note including plan of care with  maintenance Imfinzi and q 4 weeks lab /f/u and imfinzi .  Dtr requests call from  [place].

## 2019-06-08 NOTE — Progress Notes (Signed)
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  PRIOR THERAPY: None  CURRENT THERAPY: Palliative systemic chemotherapy with carboplatin for AUC of 5 on day 1, etoposide 100 mg/M2 on days 1, 2 and 3 as well as Imfinzi 1500 mg every 3 weeks with the chemotherapy and Neulasta support. First dose on 03/16/2019. Status post 4 cycles.   Starting from cycle #5 the patient will be treated with maintenance treatment with Imfinzi 1500 mg IV every 4 weeks.  INTERVAL HISTORY: Brittany Marks 78 y.o. female returns to the clinic today for follow-up visit.  The patient is feeling fine today with no concerning complaints.  She tolerated the last cycle of her systemic chemotherapy fairly well.  The rash on her left shoulder also improved.  She denied having any chest pain, shortness of breath, cough or hemoptysis.  She denied having any recent weight loss or night sweats.  She has no nausea, vomiting, diarrhea or constipation.  She has no headache or visual changes.  The patient denied having any fever or chills.  She had repeat CT scan of the chest, abdomen pelvis performed recently and she is here for evaluation and discussion of her scan results.  MEDICAL HISTORY: Past Medical History:  Diagnosis Date  . Cataract    Bilateral  . Constipation    pain medication  . Left rotator cuff tear   . Right rotator cuff tear   . SCL CA dx'd 11/2018    ALLERGIES:  is allergic to diclofenac.  MEDICATIONS:  Current Outpatient Medications  Medication Sig Dispense Refill  . b complex vitamins tablet Take 1 tablet by mouth  daily.    . COD LIVER OIL PO Take 1 tablet by mouth daily.     Marland Kitchen lidocaine-prilocaine (EMLA) cream Apply 1 application topically as needed. 30 g 0  . Multiple Vitamin (MULTIVITAMIN) tablet Take 1 tablet by mouth daily.    . nicotine (NICODERM CQ) 21 mg/24hr patch Place 1 patch (21 mg total) onto the skin daily. 28 patch 0  . oxymetazoline (AFRIN) 0.05 % nasal spray Place 2 sprays into the nose 2 (two) times daily as needed for congestion.    Vladimir Faster Glycol-Propyl Glycol (SYSTANE) 0.4-0.3 % SOLN Apply 1 drop to eye 2 (two) times daily as needed (dry eyes).    . prochlorperazine (COMPAZINE) 10 MG tablet Take 1 tablet (10 mg total) by mouth every 6 (six) hours as needed for nausea or vomiting. 30 tablet 0  . sodium chloride (OCEAN) 0.65 % nasal spray Place 1 spray into the nose 2 (two) times daily as needed for congestion.    . traMADol (ULTRAM) 50 MG tablet TAKE 1 TABLET BY MOUTH EVERY 6 TO 8 HOURS AS NEEDED FOR PAIN    . vitamin C (ASCORBIC ACID) 500 MG tablet Take 500 mg by mouth daily.    . vitamin E 400 UNIT capsule Take 400 Units by mouth daily.     No current facility-administered medications for this visit.    Facility-Administered Medications Ordered in Other Visits  Medication Dose Route Frequency Provider Last Rate Last Dose  . heparin lock flush  100 unit/mL  500 Units Intracatheter Once PRN Magrinat, Virgie Dad, MD      . sodium chloride flush (NS) 0.9 % injection 10 mL  10 mL Intracatheter PRN Magrinat, Virgie Dad, MD      . sodium chloride flush (NS) 0.9 % injection 10 mL  10 mL Intracatheter PRN Magrinat, Virgie Dad, MD   10 mL at 06/08/19 0950    SURGICAL HISTORY:  Past Surgical History:  Procedure Laterality Date  . CATARACT EXTRACTION W/ INTRAOCULAR LENS  IMPLANT, BILATERAL  2012  . DECOMPRESSIVE LUMBAR LAMINECTOMY LEVEL 2 N/A 07/29/2013   Procedure: LUMBAR LAMINECTOMY, DECOMPRESSION LUMBAR THREE TO FOUR, FOUR TO FIVE microdiscectomy l3,4 right;  Surgeon: Tobi Bastos, MD;   Location: [facility];  Service: Orthopedics;  Laterality: N/A;  . I & D SUPERIOR RIGHT SHOULDER AND CLOSURE WOUND  01-10-2011   S/P ROTATOR CUFF REPAIR  . IR IMAGING GUIDED PORT INSERTION  04/09/2019  . LUMBAR LAMINECTOMY  1970'S  . LUMBAR LAMINECTOMY/DECOMPRESSION MICRODISCECTOMY N/A 06/27/2016   Procedure: L1 - L2 DISCECTOMY;  Surgeon: Melina Schools, MD;  Location: [place];  Service: Orthopedics;  Laterality: N/A;  . [place]  . RIGHT SHOULDER ARTHROSCOPY/ OPEN DISTAL CLAVICLE RESECTION/ SAD/ OPEN ROTATOR CUFF REPAIR  11-28-2010  . SHOULDER OPEN ROTATOR CUFF REPAIR  12/19/2011   Procedure: ROTATOR CUFF REPAIR SHOULDER OPEN;  Surgeon: Magnus Sinning, MD;  Location: [place];  Service: Orthopedics;  Laterality: Right;  RIGHT RECURRENT OPEN REPAIR OF THE ROTATOR CUFF WITH TISSUE MEND GRAFTANTERIOR CHROMIOECTOMY  . VAGINAL HYSTERECTOMY  1979    REVIEW OF SYSTEMS:  Constitutional: positive for fatigue Eyes: negative Ears, nose, mouth, throat, and face: negative Respiratory: negative Cardiovascular: negative Gastrointestinal: negative Genitourinary:negative Integument/breast: negative Hematologic/lymphatic: negative Musculoskeletal:positive for muscle weakness Neurological: negative Behavioral/Psych: negative Endocrine: negative Allergic/Immunologic: negative   PHYSICAL EXAMINATION: General appearance: alert, cooperative and no distress Head: Normocephalic, without obvious abnormality, atraumatic Neck: no adenopathy, no JVD, supple, symmetrical, trachea midline and thyroid not enlarged, symmetric, no tenderness/mass/nodules Lymph nodes: Cervical, supraclavicular, and axillary nodes normal. Resp: clear to auscultation bilaterally Back: symmetric, no curvature. ROM normal. No CVA tenderness. Cardio: regular rate and rhythm, S1, S2 normal, no murmur, click, rub or gallop GI: soft, non-tender; bowel sounds normal; no masses,  no organomegaly Extremities:  extremities normal, atraumatic, no cyanosis or edema Neurologic: Alert and oriented X 3, normal strength and tone. Normal symmetric reflexes. Normal coordination and gait   ECOG PERFORMANCE STATUS: 1 - Symptomatic but completely ambulatory  Blood pressure (!) 133/48, pulse 73, temperature 98.7 F (37.1 C), temperature source Oral, resp. rate 18, height 5\' 2"  (1.575 m), weight 132 lb 3.2 oz (60 kg), SpO2 96 %.  LABORATORY DATA: Lab Results  Component Value Date   WBC 20.1 (H) 06/01/2019   HGB 9.8 (L) 06/01/2019   HCT 30.1 (L) 06/01/2019   MCV 98.7 06/01/2019   PLT 132 (L) 06/01/2019      Chemistry      Component Value Date/Time   NA 138 06/01/2019 1305   K 4.0 06/01/2019 1305   CL 106 06/01/2019 1305   CO2 24 06/01/2019 1305   BUN 14 06/01/2019 1305   CREATININE 0.73 06/01/2019 1305      Component Value Date/Time   CALCIUM 8.8 (L) 06/01/2019 1305   ALKPHOS 133 (H) 06/01/2019 1305   AST 22 06/01/2019 1305   ALT 15 06/01/2019 1305   BILITOT 0.3 06/01/2019 1305  RADIOGRAPHIC STUDIES: Ct Chest W Contrast  Result Date: 06/03/2019 CLINICAL DATA:  Restaging of small cell lung cancer. EXAM: CT CHEST, ABDOMEN, AND PELVIS WITH CONTRAST TECHNIQUE: Multidetector CT imaging of the chest, abdomen and pelvis was performed following the standard protocol during bolus administration of intravenous contrast. CONTRAST:  148mL OMNIPAQUE IOHEXOL 300 MG/ML  SOLN COMPARISON:  Multiple exams, including 04/20/2019 FINDINGS: CT CHEST FINDINGS Cardiovascular: Right Port-A-Cath tip: Right atrium. Coronary, aortic arch, and branch vessel atherosclerotic vascular disease. Mild cardiomegaly. Mediastinum/Nodes: Small type 1 hiatal hernia. Right lower paratracheal node 0.9 cm in short axis on image 21/2, formerly 0.8 cm. Right hilar node 0.8 cm in short axis on image 26/2, formerly 0.9 cm. Subcarinal/paraesophageal lymph node 1.0 cm in short axis on image 31/2, previously the same. Lungs/Pleura:  Emphysema with widespread subpleural reticulation. 4 mm in short axis subpleural lymph node along the right major fissure, image 77/4. Cylindrical bronchiectasis in the lung bases. No recurrent nodule. Musculoskeletal: Chronic fracture of the left proximal humerus. Severe glenohumeral arthropathy, right greater than left, with loss of volume of the right humeral head and scalloping and spurring of the right glenoid. Prior right distal clavicular resection. CT ABDOMEN PELVIS FINDINGS Hepatobiliary: Subtle 0.9 by 0.7 cm hypodensity in the lateral segment left hepatic lobe on image 50/2, too small to characterize. Gallbladder unremarkable. No biliary dilatation. Pancreas: Unremarkable Spleen: Unremarkable Adrenals/Urinary Tract: Dilated right collecting system and mildly dilated infundibular and calices on the right similar to the prior exam without a significant degree of delayed excretion. The adrenal glands appear normal. Stomach/Bowel: Unremarkable Vascular/Lymphatic: Dense aortoiliac atherosclerotic vascular calcification. At the site of prominent porta hepatis adenopathy from the 02/24/2019 PET-CT, there is low-density poorly marginated tissue abutting the common hepatic artery, proper hepatic artery, and portal vein. This measures 3.1 by 2.5 cm on image 53/2, and by my measurements was previously 3.4 by 2.7 cm on 04/20/2019 (mildly improved today). Reproductive: Uterus absent.  Adnexa unremarkable. Other: No supplemental non-categorized findings. Musculoskeletal: Grade 1 degenerative retrolisthesis at T12-L1, L1-2, and L2-3 with severe loss of intervertebral disc height. Endplate sclerosis at J24-Q6-S3-M1. Multilevel foraminal impingement in the lumbar spine. IMPRESSION: 1. Further improvement in the residuum of the porta hepatis nodal metastatic disease, with a low-density somewhat ill-defined lymph node of the porta hepatis abutting the common hepatic artery, proper hepatic artery, and the portal vein. This  measures 3.1 by 2.5 cm today, previously 3.4 by 2.7 cm by my measurements on 04/20/2019. 2. Prior thoracic adenopathy is relatively stable and currently upper normal in overall size. 3. No recurrent lung nodule. 4. Subtle/questionable 8 mm hypodensity in the lateral segment left hepatic lobe, technically too small to characterize, and attention to this region suggested on future exams. 5. Other imaging findings of potential clinical significance: Aortic Atherosclerosis (ICD10-I70.0) and Emphysema (ICD10-J43.9). Coronary atherosclerosis. Mild cardiomegaly. Small type 1 hiatal hernia. Subpleural reticulation in the lungs, possibly from fibrosis. Chronic fracture, left proximal humerus. Severe bilateral glenohumeral arthropathy. Chronic dilated right collecting system probably from chronic low-grade UPJ narrowing/obstruction. Multilevel lumbar impingement. Electronically Signed   By: Van Clines M.D.   On: 06/03/2019 08:32   Ct Abdomen Pelvis W Contrast  Result Date: 06/03/2019 CLINICAL DATA:  Restaging of small cell lung cancer. EXAM: CT CHEST, ABDOMEN, AND PELVIS WITH CONTRAST TECHNIQUE: Multidetector CT imaging of the chest, abdomen and pelvis was performed following the standard protocol during bolus administration of intravenous contrast. CONTRAST:  123mL OMNIPAQUE IOHEXOL 300 MG/ML  SOLN COMPARISON:  Multiple exams, including  04/20/2019 FINDINGS: CT CHEST FINDINGS Cardiovascular: Right Port-A-Cath tip: Right atrium. Coronary, aortic arch, and branch vessel atherosclerotic vascular disease. Mild cardiomegaly. Mediastinum/Nodes: Small type 1 hiatal hernia. Right lower paratracheal node 0.9 cm in short axis on image 21/2, formerly 0.8 cm. Right hilar node 0.8 cm in short axis on image 26/2, formerly 0.9 cm. Subcarinal/paraesophageal lymph node 1.0 cm in short axis on image 31/2, previously the same. Lungs/Pleura: Emphysema with widespread subpleural reticulation. 4 mm in short axis subpleural lymph node  along the right major fissure, image 77/4. Cylindrical bronchiectasis in the lung bases. No recurrent nodule. Musculoskeletal: Chronic fracture of the left proximal humerus. Severe glenohumeral arthropathy, right greater than left, with loss of volume of the right humeral head and scalloping and spurring of the right glenoid. Prior right distal clavicular resection. CT ABDOMEN PELVIS FINDINGS Hepatobiliary: Subtle 0.9 by 0.7 cm hypodensity in the lateral segment left hepatic lobe on image 50/2, too small to characterize. Gallbladder unremarkable. No biliary dilatation. Pancreas: Unremarkable Spleen: Unremarkable Adrenals/Urinary Tract: Dilated right collecting system and mildly dilated infundibular and calices on the right similar to the prior exam without a significant degree of delayed excretion. The adrenal glands appear normal. Stomach/Bowel: Unremarkable Vascular/Lymphatic: Dense aortoiliac atherosclerotic vascular calcification. At the site of prominent porta hepatis adenopathy from the 02/24/2019 PET-CT, there is low-density poorly marginated tissue abutting the common hepatic artery, proper hepatic artery, and portal vein. This measures 3.1 by 2.5 cm on image 53/2, and by my measurements was previously 3.4 by 2.7 cm on 04/20/2019 (mildly improved today). Reproductive: Uterus absent.  Adnexa unremarkable. Other: No supplemental non-categorized findings. Musculoskeletal: Grade 1 degenerative retrolisthesis at T12-L1, L1-2, and L2-3 with severe loss of intervertebral disc height. Endplate sclerosis at O13-Y8-M5-H8. Multilevel foraminal impingement in the lumbar spine. IMPRESSION: 1. Further improvement in the residuum of the porta hepatis nodal metastatic disease, with a low-density somewhat ill-defined lymph node of the porta hepatis abutting the common hepatic artery, proper hepatic artery, and the portal vein. This measures 3.1 by 2.5 cm today, previously 3.4 by 2.7 cm by my measurements on 04/20/2019. 2.  Prior thoracic adenopathy is relatively stable and currently upper normal in overall size. 3. No recurrent lung nodule. 4. Subtle/questionable 8 mm hypodensity in the lateral segment left hepatic lobe, technically too small to characterize, and attention to this region suggested on future exams. 5. Other imaging findings of potential clinical significance: Aortic Atherosclerosis (ICD10-I70.0) and Emphysema (ICD10-J43.9). Coronary atherosclerosis. Mild cardiomegaly. Small type 1 hiatal hernia. Subpleural reticulation in the lungs, possibly from fibrosis. Chronic fracture, left proximal humerus. Severe bilateral glenohumeral arthropathy. Chronic dilated right collecting system probably from chronic low-grade UPJ narrowing/obstruction. Multilevel lumbar impingement. Electronically Signed   By: Van Clines M.D.   On: 06/03/2019 08:32    ASSESSMENT AND PLAN: This is a very pleasant 78 years old white female with extensive stage small cell lung cancer and she is currently undergoing treatment with carboplatin, etoposide and Imfinzi status post 4 cycles.  The patient has been tolerating this treatment well with no concerning adverse effects. She had repeat CT scan of the chest, abdomen pelvis performed recently.  I personally and independently reviewed the scans and discussed the results with the patient today. Her scan showed further improvement of her disease. I recommended for the patient to proceed with the maintenance phase of her treatment with Imfinzi 1500 mg IV every 4 weeks starting from cycle #5. I will see her back for follow-up visit in 4 weeks for evaluation  before the next cycle of her treatment. For the skin rash and itching, this is significantly improved.  She will continue to apply the hydrocortisone cream and use Benadryl as needed. She was advised to call immediately if she has any concerning symptoms in the interval. The patient voices understanding of current disease status and  treatment options and is in agreement with the current care plan.  All questions were answered. The patient knows to call the clinic with any problems, questions or concerns. We can certainly see the patient much sooner if necessary.  Disclaimer: This note was dictated with voice recognition software. Similar sounding words can inadvertently be transcribed and may not be corrected upon review.

## 2019-06-08 NOTE — Telephone Encounter (Signed)
Gave avs and calendar ° °

## 2019-06-09 ENCOUNTER — Ambulatory Visit: Payer: Federal, State, Local not specified - PPO

## 2019-06-10 ENCOUNTER — Other Ambulatory Visit: Payer: Self-pay | Admitting: Medical Oncology

## 2019-06-10 ENCOUNTER — Telehealth: Payer: Self-pay | Admitting: Medical Oncology

## 2019-06-10 ENCOUNTER — Ambulatory Visit: Payer: Federal, State, Local not specified - PPO

## 2019-06-10 DIAGNOSIS — C3411 Malignant neoplasm of upper lobe, right bronchus or lung: Secondary | ICD-10-CM

## 2019-06-10 DIAGNOSIS — C3490 Malignant neoplasm of unspecified part of unspecified bronchus or lung: Secondary | ICD-10-CM

## 2019-06-10 NOTE — Telephone Encounter (Signed)
Per Dtr-Fell 2 weeks ago and called dtr because pt could not figure how to get up. PT usually very decisive and now she is very indecisive and can't find her words  . A lot of word-searching.  Per Mohamed> ct head ordered.

## 2019-06-10 NOTE — Telephone Encounter (Signed)
Spoke to pt re upcoming scan that is ordered.

## 2019-06-10 NOTE — Telephone Encounter (Signed)
LVM re CT scan.

## 2019-06-12 ENCOUNTER — Ambulatory Visit: Payer: Federal, State, Local not specified - PPO

## 2019-06-16 ENCOUNTER — Ambulatory Visit (HOSPITAL_COMMUNITY)
Admission: RE | Admit: 2019-06-16 | Discharge: 2019-06-16 | Disposition: A | Discharge status: Home / Self Care | Payer: Federal, State, Local not specified - PPO | Source: Ambulatory Visit | Attending: Internal Medicine | Admitting: Internal Medicine

## 2019-06-16 ENCOUNTER — Other Ambulatory Visit: Payer: Self-pay

## 2019-06-16 DIAGNOSIS — C3490 Malignant neoplasm of unspecified part of unspecified bronchus or lung: Secondary | ICD-10-CM | POA: Diagnosis not present

## 2019-06-16 MED ORDER — SODIUM CHLORIDE (PF) 0.9 % IJ SOLN
INTRAMUSCULAR | Status: AC
  Filled 2019-06-16: qty 50

## 2019-06-16 MED ORDER — IOHEXOL 300 MG/ML  SOLN
75.0000 mL | Freq: Once | INTRAMUSCULAR | Status: AC | PRN
  Administered 2019-06-16: 75 mL via INTRAVENOUS

## 2019-06-17 NOTE — Progress Notes (Signed)
  Radiation Oncology         [phone] ________________________________  Name: Brittany Marks MRN: [id number]  Date: 04/16/2019  DOB: November 21, 1940  End of Treatment Note  Diagnosis:   Bone metastasis     Indication for treatment::  palliative       Radiation treatment dates:   04/02/2019 through 04/16/2019  Site/dose:   The patient was treated to the left humerus to a dose of 30 Gy in 10 fractions at 3 Gy per fraction.  This was carried out using a complex isodose plan  Narrative: The patient tolerated radiation treatment relatively well.   No significant difficulties acutely in the treatment area.  Plan: The patient has completed radiation treatment. The patient will return to radiation oncology clinic for routine followup in one month. I advised the patient to call or return sooner if they have any questions or concerns related to their recovery or treatment. ________________________________  Jodelle Gross, M.D., Ph.D.

## 2019-06-23 ENCOUNTER — Telehealth: Payer: Self-pay | Admitting: Medical Oncology

## 2019-06-23 NOTE — Telephone Encounter (Signed)
CT of head results requested. -given to daughter.Dtr asking,  "I am wondering if she has a bit of depression". I told dtr to monitor for now and talk with Julien Nordmann at next visit and it may be related "chemo brain fog" from  her receiving chemotherapy.

## 2019-07-06 ENCOUNTER — Other Ambulatory Visit: Payer: Self-pay

## 2019-07-06 ENCOUNTER — Inpatient Hospital Stay: Payer: Federal, State, Local not specified - PPO

## 2019-07-06 ENCOUNTER — Inpatient Hospital Stay: Payer: Federal, State, Local not specified - PPO | Attending: Oncology | Admitting: Internal Medicine

## 2019-07-06 ENCOUNTER — Encounter: Payer: Self-pay | Admitting: Internal Medicine

## 2019-07-06 VITALS — BP 118/73 | HR 77 | Temp 99.1°F | Resp 16 | Ht 62.0 in | Wt 133.2 lb

## 2019-07-06 DIAGNOSIS — Z95828 Presence of other vascular implants and grafts: Secondary | ICD-10-CM

## 2019-07-06 DIAGNOSIS — R937 Abnormal findings on diagnostic imaging of other parts of musculoskeletal system: Secondary | ICD-10-CM

## 2019-07-06 DIAGNOSIS — Z5112 Encounter for antineoplastic immunotherapy: Secondary | ICD-10-CM | POA: Diagnosis not present

## 2019-07-06 DIAGNOSIS — Z1211 Encounter for screening for malignant neoplasm of colon: Secondary | ICD-10-CM

## 2019-07-06 DIAGNOSIS — Z23 Encounter for immunization: Secondary | ICD-10-CM | POA: Diagnosis not present

## 2019-07-06 DIAGNOSIS — C7951 Secondary malignant neoplasm of bone: Secondary | ICD-10-CM | POA: Insufficient documentation

## 2019-07-06 DIAGNOSIS — C778 Secondary and unspecified malignant neoplasm of lymph nodes of multiple regions: Secondary | ICD-10-CM | POA: Diagnosis not present

## 2019-07-06 DIAGNOSIS — C3411 Malignant neoplasm of upper lobe, right bronchus or lung: Secondary | ICD-10-CM

## 2019-07-06 DIAGNOSIS — Z79899 Other long term (current) drug therapy: Secondary | ICD-10-CM | POA: Diagnosis not present

## 2019-07-06 DIAGNOSIS — K5900 Constipation, unspecified: Secondary | ICD-10-CM | POA: Insufficient documentation

## 2019-07-06 DIAGNOSIS — Z5111 Encounter for antineoplastic chemotherapy: Secondary | ICD-10-CM | POA: Diagnosis present

## 2019-07-06 LAB — CBC WITH DIFFERENTIAL/PLATELET
Abs Immature Granulocytes: 0.02 10*3/uL (ref 0.00–0.07)
Basophils Absolute: 0 10*3/uL (ref 0.0–0.1)
Basophils Relative: 0 %
Eosinophils Absolute: 0.2 10*3/uL (ref 0.0–0.5)
Eosinophils Relative: 3 %
HCT: 34.8 % — ABNORMAL LOW (ref 36.0–46.0)
Hemoglobin: 11.7 g/dL — ABNORMAL LOW (ref 12.0–15.0)
Immature Granulocytes: 0 %
Lymphocytes Relative: 16 %
Lymphs Abs: 1.2 10*3/uL (ref 0.7–4.0)
MCH: 32.9 pg (ref 26.0–34.0)
MCHC: 33.6 g/dL (ref 30.0–36.0)
MCV: 97.8 fL (ref 80.0–100.0)
Monocytes Absolute: 0.7 10*3/uL (ref 0.1–1.0)
Monocytes Relative: 10 %
Neutro Abs: 5.3 10*3/uL (ref 1.7–7.7)
Neutrophils Relative %: 71 %
Platelets: 187 10*3/uL (ref 150–400)
RBC: 3.56 MIL/uL — ABNORMAL LOW (ref 3.87–5.11)
RDW: 13.3 % (ref 11.5–15.5)
WBC: 7.4 10*3/uL (ref 4.0–10.5)
nRBC: 0 % (ref 0.0–0.2)

## 2019-07-06 LAB — COMPREHENSIVE METABOLIC PANEL
ALT: 12 U/L (ref 0–44)
AST: 19 U/L (ref 15–41)
Albumin: 3.7 g/dL (ref 3.5–5.0)
Alkaline Phosphatase: 73 U/L (ref 38–126)
Anion gap: 8 (ref 5–15)
BUN: 17 mg/dL (ref 8–23)
CO2: 25 mmol/L (ref 22–32)
Calcium: 9.2 mg/dL (ref 8.9–10.3)
Chloride: 105 mmol/L (ref 98–111)
Creatinine, Ser: 0.73 mg/dL (ref 0.44–1.00)
GFR calc Af Amer: >60 mL/min (ref >60)
GFR calc non Af Amer: >60 mL/min (ref >60)
Glucose, Bld: 127 mg/dL — ABNORMAL HIGH (ref 70–99)
Potassium: 3.9 mmol/L (ref 3.5–5.1)
Sodium: 138 mmol/L (ref 135–145)
Total Bilirubin: 0.3 mg/dL (ref 0.3–1.2)
Total Protein: 7.1 g/dL (ref 6.5–8.1)

## 2019-07-06 LAB — TSH: TSH: 1.706 u[IU]/mL (ref 0.308–3.960)

## 2019-07-06 MED ORDER — SODIUM CHLORIDE 0.9 % IV SOLN
Freq: Once | INTRAVENOUS | Status: AC
  Administered 2019-07-06: 12:00:00 via INTRAVENOUS
  Filled 2019-07-06: qty 250

## 2019-07-06 MED ORDER — INFLUENZA VAC A&B SA ADJ QUAD 0.5 ML IM PRSY
0.5000 mL | PREFILLED_SYRINGE | Freq: Once | INTRAMUSCULAR | Status: AC
  Administered 2019-07-06: 0.5 mL via INTRAMUSCULAR

## 2019-07-06 MED ORDER — INFLUENZA VAC A&B SA ADJ QUAD 0.5 ML IM PRSY
PREFILLED_SYRINGE | INTRAMUSCULAR | Status: AC
  Filled 2019-07-06: qty 0.5

## 2019-07-06 MED ORDER — SODIUM CHLORIDE 0.9% FLUSH
10.0000 mL | INTRAVENOUS | Status: DC | PRN
  Administered 2019-07-06: 10 mL
  Filled 2019-07-06: qty 10

## 2019-07-06 MED ORDER — HEPARIN SOD (PORK) LOCK FLUSH 100 UNIT/ML IV SOLN
500.0000 [IU] | Freq: Once | INTRAVENOUS | Status: AC | PRN
  Administered 2019-07-06: 500 [IU]
  Filled 2019-07-06: qty 5

## 2019-07-06 MED ORDER — SODIUM CHLORIDE 0.9 % IV SOLN
1500.0000 mg | Freq: Once | INTRAVENOUS | Status: AC
  Administered 2019-07-06: 1500 mg via INTRAVENOUS
  Filled 2019-07-06: qty 30

## 2019-07-06 NOTE — Progress Notes (Signed)
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  PRIOR THERAPY: None  CURRENT THERAPY: Palliative systemic chemotherapy with carboplatin for AUC of 5 on day 1, etoposide 100 mg/M2 on days 1, 2 and 3 as well as Imfinzi 1500 mg every 3 weeks with the chemotherapy and Neulasta support. First dose on 03/16/2019. Status post 5 cycles.   Starting from cycle #5 the patient will be treated with maintenance treatment with Imfinzi 1500 mg IV every 4 weeks.  INTERVAL HISTORY: Brittany Marks 78 y.o. female returns to the clinic today for follow-up visit.  The patient is feeling fine today with no concerning complaints.  She tolerated the first dose of her maintenance treatment with Imfinzi fairly well.  She denied having any chest pain but has shortness of breath with exertion with no cough or hemoptysis.  She denied having any fever or chills.  She has no nausea, vomiting, diarrhea or constipation.  She has no skin rash.  She is here today for evaluation before starting cycle #6 of her treatment.  MEDICAL HISTORY: Past Medical History:  Diagnosis Date   Cataract    Bilateral   Constipation    pain medication   Left rotator cuff tear    Right rotator cuff tear    SCL CA dx'd 11/2018    ALLERGIES:  is allergic to diclofenac.  MEDICATIONS:  Current Outpatient Medications  Medication Sig Dispense Refill   b complex vitamins tablet Take 1 tablet by mouth daily.     COD LIVER OIL PO Take 1 tablet by mouth daily.      lidocaine-prilocaine (EMLA) cream Apply 1 application topically as needed. 30 g 0     Multiple Vitamin (MULTIVITAMIN) tablet Take 1 tablet by mouth daily.     nicotine (NICODERM CQ) 21 mg/24hr patch Place 1 patch (21 mg total) onto the skin daily. 28 patch 0   oxymetazoline (AFRIN) 0.05 % nasal spray Place 2 sprays into the nose 2 (two) times daily as needed for congestion.     Polyethyl Glycol-Propyl Glycol (SYSTANE) 0.4-0.3 % SOLN Apply 1 drop to eye 2 (two) times daily as needed (dry eyes).     prochlorperazine (COMPAZINE) 10 MG tablet Take 1 tablet (10 mg total) by mouth every 6 (six) hours as needed for nausea or vomiting. 30 tablet 0   sodium chloride (OCEAN) 0.65 % nasal spray Place 1 spray into the nose 2 (two) times daily as needed for congestion.     traMADol (ULTRAM) 50 MG tablet TAKE 1 TABLET BY MOUTH EVERY 6 TO 8 HOURS AS NEEDED FOR PAIN     vitamin C (ASCORBIC ACID) 500 MG tablet Take 500 mg by mouth daily.     vitamin E 400 UNIT capsule Take 400 Units by mouth daily.     No current facility-administered medications for this visit.    Facility-Administered Medications Ordered in Other Visits  Medication Dose Route Frequency Provider Last Rate Last Dose   heparin lock flush 100 unit/mL  500 Units Intracatheter Once PRN Magrinat, Virgie Dad, MD       sodium chloride flush (NS) 0.9 % injection  10 mL  10 mL Intracatheter PRN Magrinat, Virgie Dad, MD        SURGICAL HISTORY:  Past Surgical History:  Procedure Laterality Date   CATARACT EXTRACTION W/ INTRAOCULAR LENS  IMPLANT, BILATERAL  2012   DECOMPRESSIVE LUMBAR LAMINECTOMY LEVEL 2 N/A 07/29/2013   Procedure: LUMBAR LAMINECTOMY, DECOMPRESSION LUMBAR THREE TO FOUR, FOUR TO FIVE microdiscectomy l3,4 right;  Surgeon: Tobi Bastos, MD;  Location: [facility];  Service: Orthopedics;  Laterality: N/A;   I & D SUPERIOR RIGHT SHOULDER AND CLOSURE WOUND  01-10-2011   S/P ROTATOR CUFF REPAIR   IR IMAGING GUIDED PORT INSERTION  04/09/2019   LUMBAR LAMINECTOMY  1970'S   LUMBAR LAMINECTOMY/DECOMPRESSION  MICRODISCECTOMY N/A 06/27/2016   Procedure: L1 - L2 DISCECTOMY;  Surgeon: Melina Schools, MD;  Location: [facility];  Service: Orthopedics;  Laterality: N/A;   LUMBAR SPINE SURGERY  1983   RIGHT SHOULDER ARTHROSCOPY/ OPEN DISTAL CLAVICLE RESECTION/ SAD/ OPEN ROTATOR CUFF REPAIR  11-28-2010   SHOULDER OPEN ROTATOR CUFF REPAIR  12/19/2011   Procedure: ROTATOR CUFF REPAIR SHOULDER OPEN;  Surgeon: Magnus Sinning, MD;  Location: [facility];  Service: Orthopedics;  Laterality: Right;  RIGHT RECURRENT OPEN REPAIR OF THE ROTATOR CUFF WITH TISSUE MEND GRAFTANTERIOR CHROMIOECTOMY   VAGINAL HYSTERECTOMY  1979    REVIEW OF SYSTEMS:  A comprehensive review of systems was negative except for: Constitutional: positive for fatigue Respiratory: positive for dyspnea on exertion   PHYSICAL EXAMINATION: General appearance: alert, cooperative, fatigued and no distress Head: Normocephalic, without obvious abnormality, atraumatic Neck: no adenopathy, no JVD, supple, symmetrical, trachea midline and thyroid not enlarged, symmetric, no tenderness/mass/nodules Lymph nodes: Cervical, supraclavicular, and axillary nodes normal. Resp: clear to auscultation bilaterally Back: symmetric, no curvature. ROM normal. No CVA tenderness. Cardio: regular rate and rhythm, S1, S2 normal, no murmur, click, rub or gallop GI: soft, non-tender; bowel sounds normal; no masses,  no organomegaly Extremities: extremities normal, atraumatic, no cyanosis or edema   ECOG PERFORMANCE STATUS: 1 - Symptomatic but completely ambulatory  Blood pressure 118/73, pulse 77, temperature 99.1 F (37.3 C), temperature source Temporal, resp. rate 16, height 5\' 2"  (1.575 m), weight 133 lb 3.2 oz (60.4 kg), SpO2 97 %.  LABORATORY DATA: Lab Results  Component Value Date   WBC 7.4 07/06/2019   HGB 11.7 (L) 07/06/2019   HCT 34.8 (L) 07/06/2019   MCV 97.8 07/06/2019   PLT 187 07/06/2019      Chemistry      Component Value Date/Time     NA 139 06/08/2019 0922   K 4.1 06/08/2019 0922   CL 107 06/08/2019 0922   CO2 21 (L) 06/08/2019 0922   BUN 21 06/08/2019 0922   CREATININE 0.79 06/08/2019 0922   CREATININE 0.73 06/01/2019 1305      Component Value Date/Time   CALCIUM 9.0 06/08/2019 0922   ALKPHOS 105 06/08/2019 0922   AST 18 06/08/2019 0922   AST 22 06/01/2019 1305   ALT 13 06/08/2019 0922   ALT 15 06/01/2019 1305   BILITOT 0.3 06/08/2019 0922   BILITOT 0.3 06/01/2019 1305       RADIOGRAPHIC STUDIES: Ct Head W Wo Contrast  Result Date: 06/17/2019 CLINICAL DATA:  78 year old female with extensive stage small cell lung cancer. Headache, word finding difficulty, fall. Has not yet undergone prophylactic cranial irradiation. EXAM: CT HEAD WITHOUT AND WITH CONTRAST TECHNIQUE: Contiguous axial images were obtained from the base of the skull through the vertex without and with intravenous contrast  CONTRAST:  65mL OMNIPAQUE IOHEXOL 300 MG/ML  SOLN COMPARISON:  Brain MRI 03/19/2019. FINDINGS: Brain: Patchy and confluent bilateral cerebral white matter hypodensity appears similar to the T2/FLAIR abnormality on the June MRI. Stable cerebral volume. No midline shift, ventriculomegaly, mass effect, evidence of mass lesion, intracranial hemorrhage or evidence of cortically based acute infarction. No abnormal enhancement identified. Punctate dystrophic calcification at the junction of the posterior left temporal and occipital lobes on series 2, image 13. Vascular: Calcified atherosclerosis at the skull base. The major intracranial vascular structures are enhancing as expected. Skull: No acute or suspicious osseous lesion identified. Sinuses/Orbits: Visualized paranasal sinuses and mastoids are clear. Other: Negative visible orbit and scalp soft tissues. IMPRESSION: Stable compared to the June MRI. No acute or metastatic intracranial abnormality identified. Electronically Signed   By: Genevie Ann M.D.   On: 06/17/2019 10:17    ASSESSMENT  AND PLAN: This is a very pleasant 79 years old white female with extensive stage small cell lung cancer and she is currently undergoing treatment with carboplatin, etoposide and Imfinzi status post 5 cycles.  Starting from cycle #5 the patient is on maintenance treatment with single agent Imfinzi every 4 weeks status post 1 cycle. I recommended for her to proceed with cycle #6 of her treatment today. I will see her back for follow-up visit in 4 weeks for evaluation after repeating CT scan of the chest, abdomen and pelvis for restaging of her disease. The patient will receive flu vaccine today. She was advised to call immediately if she has any concerning symptoms in the interval.  The patient voices understanding of current disease status and treatment options and is in agreement with the current care plan.  All questions were answered. The patient knows to call the clinic with any problems, questions or concerns. We can certainly see the patient much sooner if necessary.  Disclaimer: This note was dictated with voice recognition software. Similar sounding words can inadvertently be transcribed and may not be corrected upon review.

## 2019-07-06 NOTE — Patient Instructions (Signed)

## 2019-07-06 NOTE — Patient Instructions (Signed)
[facility] Discharge Instructions for Patients Receiving Chemotherapy  Today you received the following chemotherapy agents: Durvalumab (Imfinzi).  To help prevent nausea and vomiting after your treatment, we encourage you to take your nausea medication as directed.   If you develop nausea and vomiting that is not controlled by your nausea medication, call the clinic.   BELOW ARE SYMPTOMS THAT SHOULD BE REPORTED IMMEDIATELY:  *FEVER GREATER THAN 100.5 F  *CHILLS WITH OR WITHOUT FEVER  NAUSEA AND VOMITING THAT IS NOT CONTROLLED WITH YOUR NAUSEA MEDICATION  *UNUSUAL SHORTNESS OF BREATH  *UNUSUAL BRUISING OR BLEEDING  TENDERNESS IN MOUTH AND THROAT WITH OR WITHOUT PRESENCE OF ULCERS  *URINARY PROBLEMS  *BOWEL PROBLEMS  UNUSUAL RASH Items with * indicate a potential emergency and should be followed up as soon as possible.  Feel free to call the clinic should you have any questions or concerns. The clinic phone number is [phone].  Please show the [facility] at check-in to the Emergency Department and triage nurse.  Coronavirus (COVID-19) Are you at risk?  Are you at risk for the Coronavirus (COVID-19)?  To be considered HIGH RISK for Coronavirus (COVID-19), you have to meet the following criteria:  . Traveled to [place], [place], [place], [place] or [place]; or in the [place] to [place], [place], [place], or [place]; and have fever, cough, and shortness of breath within the last 2 weeks of travel OR . Been in close contact with a person diagnosed with COVID-19 within the last 2 weeks and have fever, cough, and shortness of breath . IF YOU DO NOT MEET THESE CRITERIA, YOU ARE CONSIDERED LOW RISK FOR COVID-19.  What to do if you are HIGH RISK for COVID-19?  Marland Kitchen If you are having a medical emergency, call 911. . Seek medical care right away. Before you go to a doctor's office, urgent care or emergency department, call ahead and tell them  about your recent travel, contact with someone diagnosed with COVID-19, and your symptoms. You should receive instructions from your physician's office regarding next steps of care.  . When you arrive at healthcare provider, tell the healthcare staff immediately you have returned from visiting [place], [place], [place], [place] or [place]; or traveled in the [place] to [place], [place], [place], or [place]; in the last two weeks or you have been in close contact with a person diagnosed with COVID-19 in the last 2 weeks.   . Tell the health care staff about your symptoms: fever, cough and shortness of breath. . After you have been seen by a medical provider, you will be either: o Tested for (COVID-19) and discharged home on quarantine except to seek medical care if symptoms worsen, and asked to  - Stay home and avoid contact with others until you get your results (4-5 days)  - Avoid travel on public transportation if possible (such as bus, train, or airplane) or o Sent to the Emergency Department by EMS for evaluation, COVID-19 testing, and possible admission depending on your condition and test results.  What to do if you are LOW RISK for COVID-19?  Reduce your risk of any infection by using the same precautions used for avoiding the common cold or flu:  Marland Kitchen Wash your hands often with soap and warm water for at least 20 seconds.  If soap and water are not readily available, use an alcohol-based hand sanitizer with at least 60% alcohol.  . If coughing or  sneezing, cover your mouth and nose by coughing or sneezing into the elbow areas of your shirt or coat, into a tissue or into your sleeve (not your hands). . Avoid shaking hands with others and consider head nods or verbal greetings only. . Avoid touching your eyes, nose, or mouth with unwashed hands.  . Avoid close contact with people who are sick. . Avoid places or events with large numbers of people in one location, like concerts or  sporting events. . Carefully consider travel plans you have or are making. . If you are planning any travel outside or inside the [place], visit the CDC's Travelers' Health webpage for the latest health notices. . If you have some symptoms but not all symptoms, continue to monitor at home and seek medical attention if your symptoms worsen. . If you are having a medical emergency, call 911.   [facility] [facility] / e-Visit: [email or web address]         [facility]: [facility]: [phone]                   [facility]: [phone]

## 2019-07-21 ENCOUNTER — Telehealth: Payer: Self-pay | Admitting: *Deleted

## 2019-07-21 NOTE — Telephone Encounter (Signed)
Received call from patient's daughter, Lynnell Dike. She is aware that pt has a scan coming up as well as appt with Dr. Lavona Mound is requesting a call from Dr. Julien Nordmann after the appt to review the scan results with her. Lujean Rave is in a position where her husband may be getting a job transfer to [place] and they will have to move there before Thanksgiving. She is trying to decide if her mother can tolerate a move there herself or if it would be better for patient to stay here in [place]. The daughter wants to know the exact results of the scan and what it means for her mother's treatment and care needs in the near future  Scan is scheduled for 07/28/19 and appt with Dr. Julien Nordmann is 08/03/19

## 2019-07-21 NOTE — Telephone Encounter (Signed)
[place].  Please remind me to call the daughter at the time of the visit.  Thank you.

## 2019-07-28 ENCOUNTER — Ambulatory Visit (HOSPITAL_COMMUNITY)
Admission: RE | Admit: 2019-07-28 | Discharge: 2019-07-28 | Disposition: A | Discharge status: Home / Self Care | Payer: Federal, State, Local not specified - PPO | Source: Ambulatory Visit | Attending: Internal Medicine | Admitting: Internal Medicine

## 2019-07-28 ENCOUNTER — Other Ambulatory Visit: Payer: Self-pay

## 2019-07-28 ENCOUNTER — Encounter (HOSPITAL_COMMUNITY): Payer: Self-pay

## 2019-07-28 DIAGNOSIS — C3411 Malignant neoplasm of upper lobe, right bronchus or lung: Secondary | ICD-10-CM | POA: Insufficient documentation

## 2019-07-28 MED ORDER — SODIUM CHLORIDE (PF) 0.9 % IJ SOLN
INTRAMUSCULAR | Status: AC
  Filled 2019-07-28: qty 50

## 2019-07-28 MED ORDER — IOHEXOL 300 MG/ML  SOLN
100.0000 mL | Freq: Once | INTRAMUSCULAR | Status: AC | PRN
  Administered 2019-07-28: 100 mL via INTRAVENOUS

## 2019-07-28 MED ORDER — HEPARIN SOD (PORK) LOCK FLUSH 100 UNIT/ML IV SOLN
500.0000 [IU] | Freq: Once | INTRAVENOUS | Status: AC
  Administered 2019-07-28: 500 [IU] via INTRAVENOUS

## 2019-07-28 MED ORDER — HEPARIN SOD (PORK) LOCK FLUSH 100 UNIT/ML IV SOLN
INTRAVENOUS | Status: AC
  Filled 2019-07-28: qty 5

## 2019-08-03 ENCOUNTER — Inpatient Hospital Stay: Payer: Federal, State, Local not specified - PPO | Attending: Oncology

## 2019-08-03 ENCOUNTER — Other Ambulatory Visit: Payer: Self-pay

## 2019-08-03 ENCOUNTER — Inpatient Hospital Stay (HOSPITAL_BASED_OUTPATIENT_CLINIC_OR_DEPARTMENT_OTHER): Payer: Federal, State, Local not specified - PPO | Admitting: Internal Medicine

## 2019-08-03 ENCOUNTER — Encounter: Payer: Self-pay | Admitting: Internal Medicine

## 2019-08-03 ENCOUNTER — Inpatient Hospital Stay: Payer: Federal, State, Local not specified - PPO

## 2019-08-03 VITALS — BP 163/79 | HR 75 | Temp 98.5°F | Resp 17 | Ht 62.0 in | Wt 131.6 lb

## 2019-08-03 DIAGNOSIS — J439 Emphysema, unspecified: Secondary | ICD-10-CM | POA: Insufficient documentation

## 2019-08-03 DIAGNOSIS — M5135 Other intervertebral disc degeneration, thoracolumbar region: Secondary | ICD-10-CM | POA: Diagnosis not present

## 2019-08-03 DIAGNOSIS — Z5112 Encounter for antineoplastic immunotherapy: Secondary | ICD-10-CM

## 2019-08-03 DIAGNOSIS — M19011 Primary osteoarthritis, right shoulder: Secondary | ICD-10-CM | POA: Insufficient documentation

## 2019-08-03 DIAGNOSIS — Z95828 Presence of other vascular implants and grafts: Secondary | ICD-10-CM

## 2019-08-03 DIAGNOSIS — R937 Abnormal findings on diagnostic imaging of other parts of musculoskeletal system: Secondary | ICD-10-CM

## 2019-08-03 DIAGNOSIS — K5900 Constipation, unspecified: Secondary | ICD-10-CM | POA: Insufficient documentation

## 2019-08-03 DIAGNOSIS — C3411 Malignant neoplasm of upper lobe, right bronchus or lung: Secondary | ICD-10-CM | POA: Insufficient documentation

## 2019-08-03 DIAGNOSIS — I119 Hypertensive heart disease without heart failure: Secondary | ICD-10-CM | POA: Diagnosis not present

## 2019-08-03 DIAGNOSIS — Z79899 Other long term (current) drug therapy: Secondary | ICD-10-CM | POA: Diagnosis not present

## 2019-08-03 DIAGNOSIS — I700 Atherosclerosis of aorta: Secondary | ICD-10-CM | POA: Diagnosis not present

## 2019-08-03 DIAGNOSIS — I2510 Atherosclerotic heart disease of native coronary artery without angina pectoris: Secondary | ICD-10-CM | POA: Insufficient documentation

## 2019-08-03 DIAGNOSIS — Z5111 Encounter for antineoplastic chemotherapy: Secondary | ICD-10-CM | POA: Diagnosis not present

## 2019-08-03 DIAGNOSIS — C7951 Secondary malignant neoplasm of bone: Secondary | ICD-10-CM | POA: Insufficient documentation

## 2019-08-03 DIAGNOSIS — C7952 Secondary malignant neoplasm of bone marrow: Secondary | ICD-10-CM

## 2019-08-03 DIAGNOSIS — C778 Secondary and unspecified malignant neoplasm of lymph nodes of multiple regions: Secondary | ICD-10-CM | POA: Insufficient documentation

## 2019-08-03 DIAGNOSIS — Z1211 Encounter for screening for malignant neoplasm of colon: Secondary | ICD-10-CM

## 2019-08-03 LAB — COMPREHENSIVE METABOLIC PANEL
ALT: 11 U/L (ref 0–44)
AST: 20 U/L (ref 15–41)
Albumin: 3.5 g/dL (ref 3.5–5.0)
Alkaline Phosphatase: 70 U/L (ref 38–126)
Anion gap: 10 (ref 5–15)
BUN: 20 mg/dL (ref 8–23)
CO2: 23 mmol/L (ref 22–32)
Calcium: 9.6 mg/dL (ref 8.9–10.3)
Chloride: 104 mmol/L (ref 98–111)
Creatinine, Ser: 0.79 mg/dL (ref 0.44–1.00)
GFR calc Af Amer: >60 mL/min (ref >60)
GFR calc non Af Amer: >60 mL/min (ref >60)
Glucose, Bld: 110 mg/dL — ABNORMAL HIGH (ref 70–99)
Potassium: 3.9 mmol/L (ref 3.5–5.1)
Sodium: 137 mmol/L (ref 135–145)
Total Bilirubin: 0.3 mg/dL (ref 0.3–1.2)
Total Protein: 7 g/dL (ref 6.5–8.1)

## 2019-08-03 LAB — CBC WITH DIFFERENTIAL/PLATELET
Abs Immature Granulocytes: 0.02 10*3/uL (ref 0.00–0.07)
Basophils Absolute: 0 10*3/uL (ref 0.0–0.1)
Basophils Relative: 0 %
Eosinophils Absolute: 0.2 10*3/uL (ref 0.0–0.5)
Eosinophils Relative: 2 %
HCT: 35.1 % — ABNORMAL LOW (ref 36.0–46.0)
Hemoglobin: 11.7 g/dL — ABNORMAL LOW (ref 12.0–15.0)
Immature Granulocytes: 0 %
Lymphocytes Relative: 23 %
Lymphs Abs: 1.8 10*3/uL (ref 0.7–4.0)
MCH: 31.5 pg (ref 26.0–34.0)
MCHC: 33.3 g/dL (ref 30.0–36.0)
MCV: 94.6 fL (ref 80.0–100.0)
Monocytes Absolute: 0.6 10*3/uL (ref 0.1–1.0)
Monocytes Relative: 9 %
Neutro Abs: 4.9 10*3/uL (ref 1.7–7.7)
Neutrophils Relative %: 66 %
Platelets: 231 10*3/uL (ref 150–400)
RBC: 3.71 MIL/uL — ABNORMAL LOW (ref 3.87–5.11)
RDW: 12.7 % (ref 11.5–15.5)
WBC: 7.6 10*3/uL (ref 4.0–10.5)
nRBC: 0 % (ref 0.0–0.2)

## 2019-08-03 LAB — TSH: TSH: 2.528 u[IU]/mL (ref 0.308–3.960)

## 2019-08-03 IMAGING — CT CT CHEST WITH CONTRAST
2 of 5 series · 12 of 36 positions shown, 15 images · IV contrast (APPLIED)
Comparison: PET-CT 02/24/2019.

CLINICAL DATA: Small-cell lung cancer.

EXAM:
CT CHEST, ABDOMEN, AND PELVIS WITH CONTRAST
TECHNIQUE: Multidetector CT imaging of the chest, abdomen and pelvis was
performed following the standard protocol during bolus
administration of intravenous contrast.
CONTRAST:  100mL OMNIPAQUE IOHEXOL 300 MG/ML  SOLN

[Series 2: cap with · axial · 0.73mm/px · z∈[-490,-35]mm · 9 of 115 slices shown, 12 images]
[im 12/115  mediastinal]
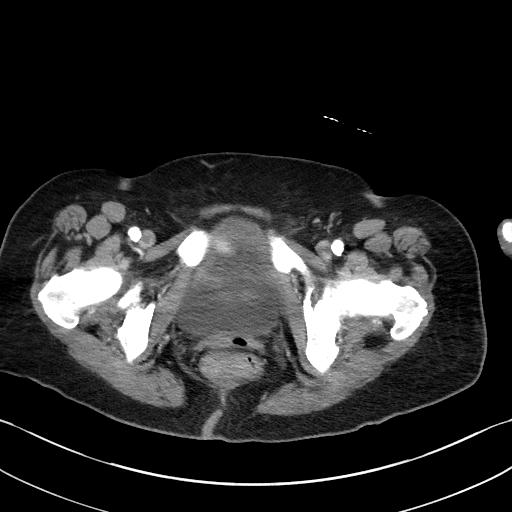
[im 12/115  lung]
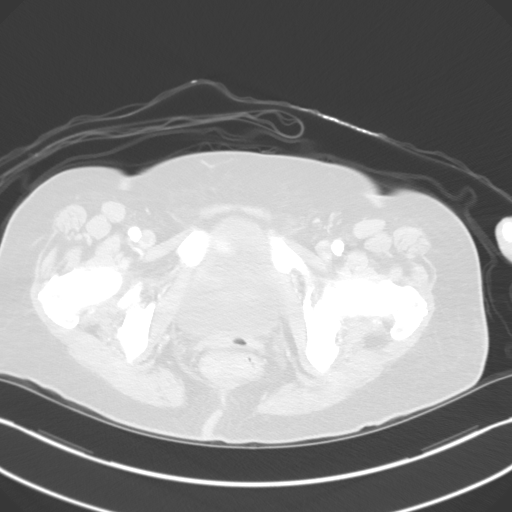
[im 23/115  lung]
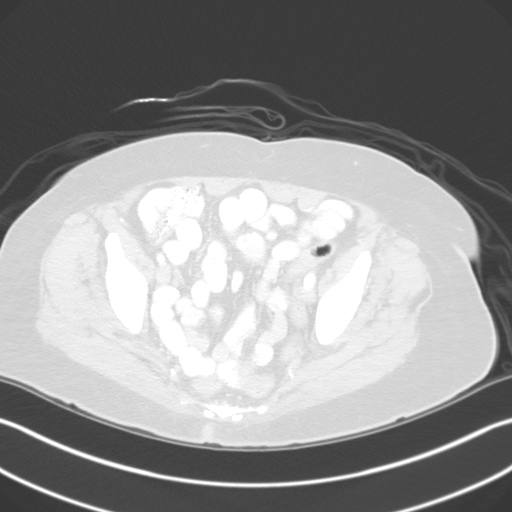
[im 35/115  lung]
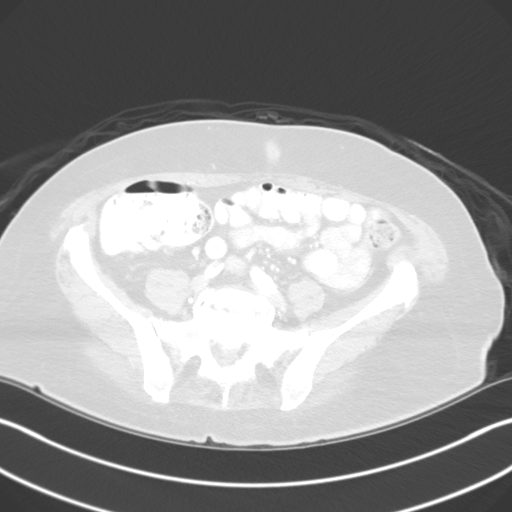
[im 46/115  lung]
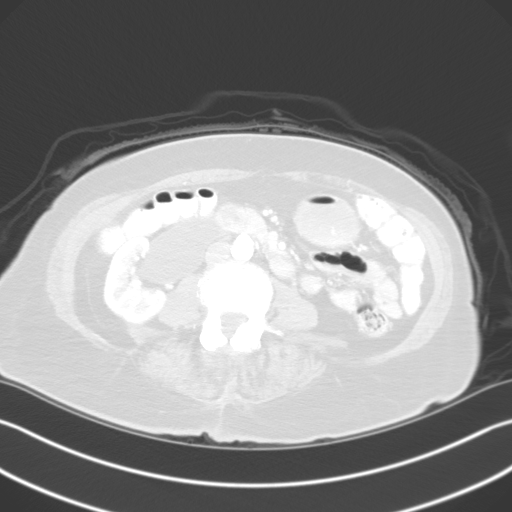
[im 58/115  mediastinal]
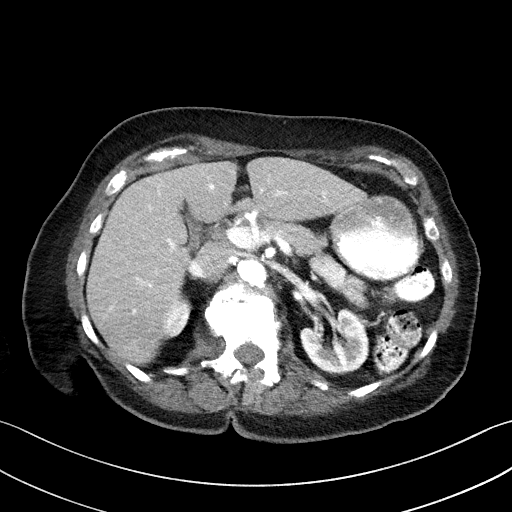
[im 58/115  lung]
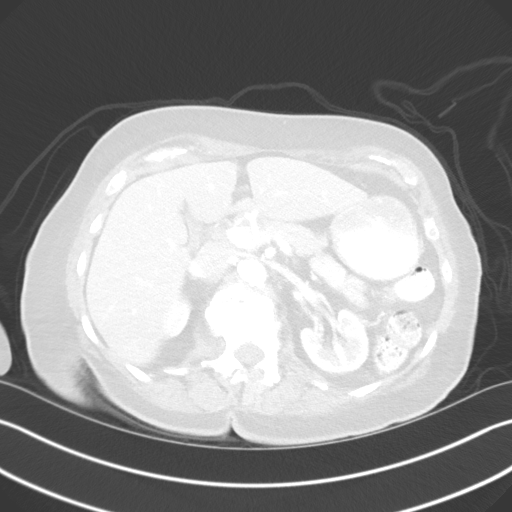
[im 69/115  lung]
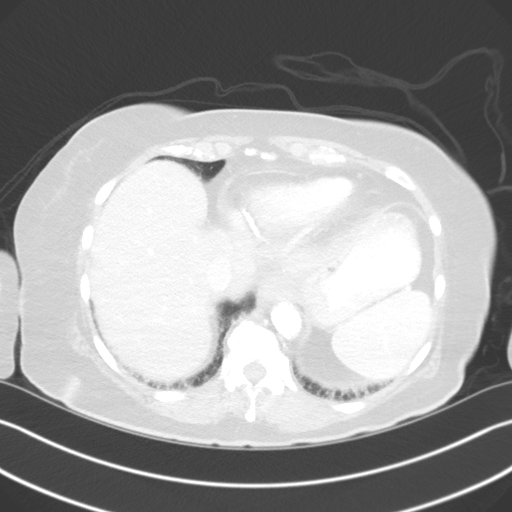
[im 80/115  lung]
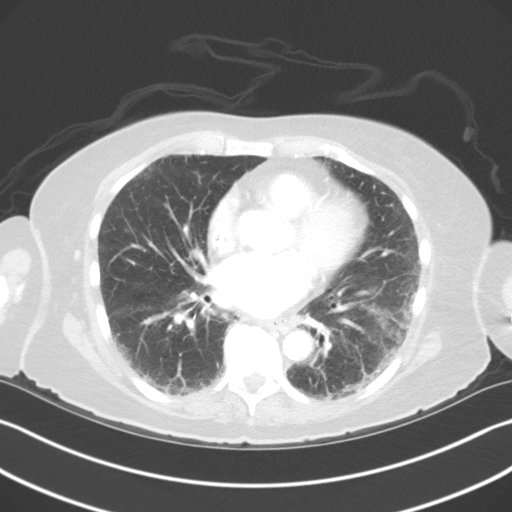
[im 92/115  lung]
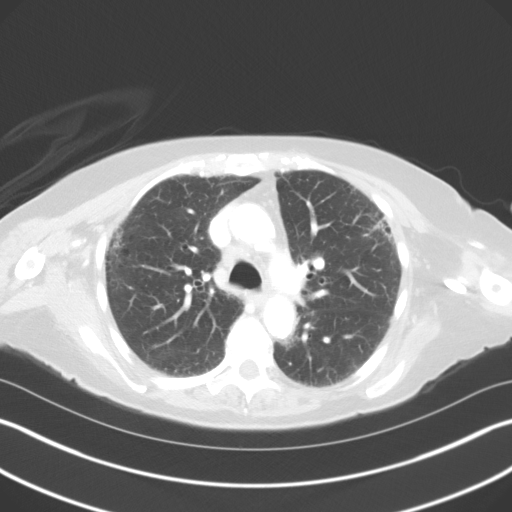
[im 103/115  mediastinal]
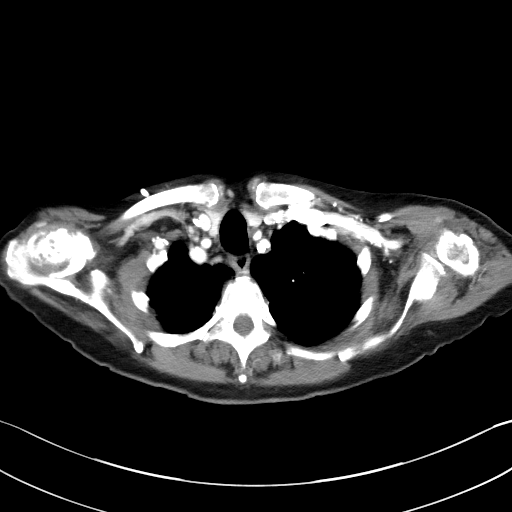
[im 103/115  lung]
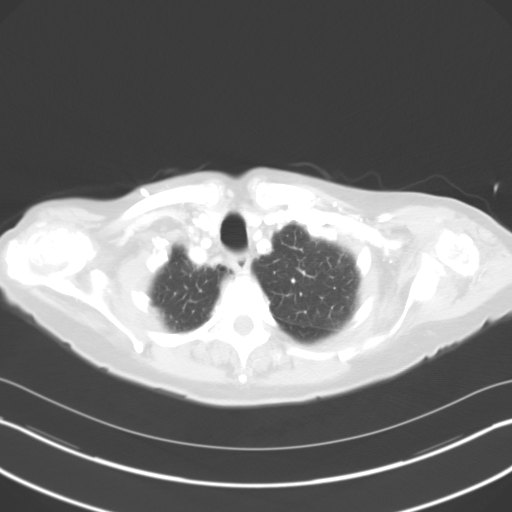

[Series 5: coronals · coronal · 0.79mm/px · 3 of 125 slices shown]
[im 25/125  lung]
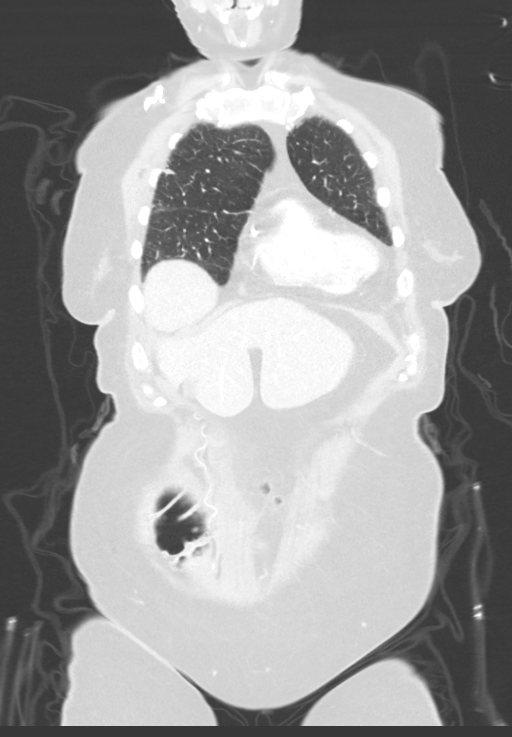
[im 50/125  lung]
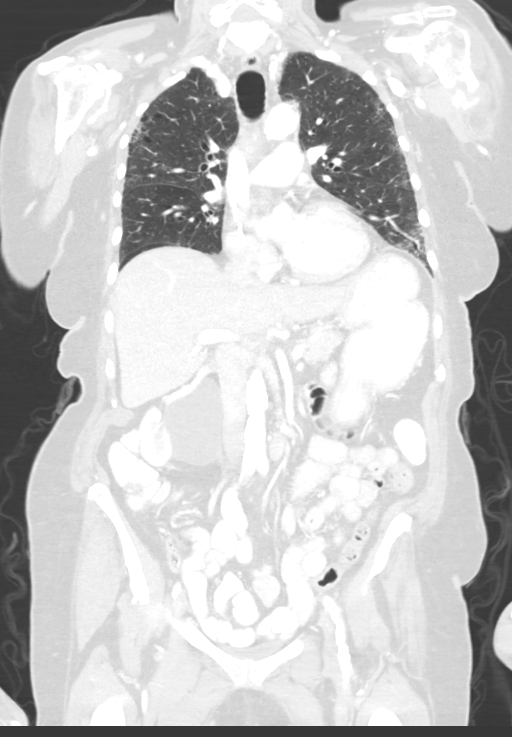
[im 75/125  lung]
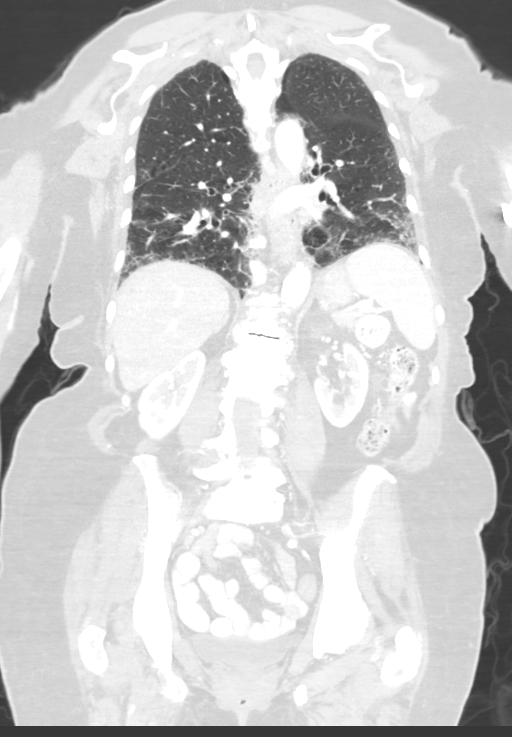

[12 of 36 positions shown; findings below may reference images not displayed]

FINDINGS: CT CHEST FINDINGS

Cardiovascular: The heart size is normal. No substantial pericardial
effusion. Coronary artery calcification is evident. Atherosclerotic
calcification is noted in the wall of the thoracic aorta. Right
Port-A-Cath tip positioned in the upper right atrium.

Mediastinum/Nodes: 2.1 cm short axis precarinal lymph node measured
previously has decreased in the interval measuring 8 mm short axis
today. No hilar lymphadenopathy. The esophagus has normal imaging
features. There is no axillary lymphadenopathy.

Lungs/Pleura: Centrilobular emphsyema noted. Right upper lobe
pulmonary nodule seen on previous PET-CT have resolved in the
interval. Subpleural reticulation in the lungs is compatible with
underlying fibrotic lung disease. No new suspicious pulmonary nodule
or mass. No pleural effusion.

Musculoskeletal: No worrisome lytic or sclerotic osseous
abnormality.

CT ABDOMEN PELVIS FINDINGS

Hepatobiliary: No suspicious focal abnormality within the liver
parenchyma. There is no evidence for gallstones, gallbladder wall
thickening, or pericholecystic fluid. No intrahepatic or
extrahepatic biliary dilation.

Pancreas: No focal mass lesion. No dilatation of the main duct. No
intraparenchymal cyst. No peripancreatic edema.

Spleen: No splenomegaly. No focal mass lesion.

Adrenals/Urinary Tract: No adrenal nodule or mass. Similar
appearance right hydronephrosis. Left kidney unremarkable. No
evidence for hydroureter. The urinary bladder appears normal for the
degree of distention.

Stomach/Bowel: Stomach is unremarkable. No gastric wall thickening.
No evidence of outlet obstruction. Duodenum is normally positioned
as is the ligament of Treitz. No small bowel wall thickening. No
small bowel dilatation. The terminal ileum is normal. The appendix
is normal. No gross colonic mass. No colonic wall thickening.

Vascular/Lymphatic: There is abdominal aortic atherosclerosis
without aneurysm. The lesion identified previously in the porta
hepatis has decreased in the interval measuring 2.8 x 2.9 cm today
compared to 5.2 x 5.4 cm previously. The retrocaval lymph node seen
on previous PET-CT has no measurable residual on today's study.
There is a 8 mm short axis portal caval node visible on image
60/series 2. No pelvic sidewall lymphadenopathy.

Reproductive: The uterus is surgically absent. There is no adnexal
mass.

Other: No intraperitoneal free fluid.

Musculoskeletal: No worrisome lytic or sclerotic osseous
abnormality. Advanced degenerative changes noted in the lumbar
spine.
IMPRESSION: 1. Interval resolution of right upper lobe pulmonary nodule seen to
be hypermetabolic on previous PET-CT. The lymphadenopathy in the
chest and abdomen has decreased markedly in the interval.
2. Hypermetabolic lesion in the porta hepatis identified on the
previous exam has markedly decreased in the interval.
3. No new or progressive findings on today's study.
4. Stable appearance of right hydronephrosis likely accentuated by
extrarenal pelvis. Component of UPJ obstruction suspected.
5. Chronic interstitial changes compatible with underlying
interstitial lung disease.
6.  Aortic Atherosclerois (81PT4-170.0)
7.  Emphysema. (81PT4-ECN.3)

## 2019-08-03 MED ORDER — SODIUM CHLORIDE 0.9 % IV SOLN
Freq: Once | INTRAVENOUS | Status: AC
  Administered 2019-08-03: 10:00:00 via INTRAVENOUS
  Filled 2019-08-03: qty 250

## 2019-08-03 MED ORDER — SODIUM CHLORIDE 0.9% FLUSH
10.0000 mL | INTRAVENOUS | Status: DC | PRN
  Administered 2019-08-03: 10 mL
  Filled 2019-08-03: qty 10

## 2019-08-03 MED ORDER — HEPARIN SOD (PORK) LOCK FLUSH 100 UNIT/ML IV SOLN
500.0000 [IU] | Freq: Once | INTRAVENOUS | Status: AC | PRN
  Administered 2019-08-03: 500 [IU]
  Filled 2019-08-03: qty 5

## 2019-08-03 MED ORDER — SODIUM CHLORIDE 0.9 % IV SOLN
1500.0000 mg | Freq: Once | INTRAVENOUS | Status: AC
  Administered 2019-08-03: 1500 mg via INTRAVENOUS
  Filled 2019-08-03: qty 30

## 2019-08-03 NOTE — Patient Instructions (Signed)
Steps to Quit Smoking Smoking tobacco is the leading cause of preventable death. It can affect almost every organ in the body. Smoking puts you and people around you at risk for many serious, long-lasting (chronic) diseases. Quitting smoking can be hard, but it is one of the best things that you can do for your health. It is never too late to quit. How do I get ready to quit? When you decide to quit smoking, make a plan to help you succeed. Before you quit:  Pick a date to quit. Set a date within the next 2 weeks to give you time to prepare.  Write down the reasons why you are quitting. Keep this list in places where you will see it often.  Tell your family, friends, and co-workers that you are quitting. Their support is important.  Talk with your doctor about the choices that may help you quit.  Find out if your health insurance will pay for these treatments.  Know the people, places, things, and activities that make you want to smoke (triggers). Avoid them. What first steps can I take to quit smoking?  Throw away all cigarettes at home, at work, and in your car.  Throw away the things that you use when you smoke, such as ashtrays and lighters.  Clean your car. Make sure to empty the ashtray.  Clean your home, including curtains and carpets. What can I do to help me quit smoking? Talk with your doctor about taking medicines and seeing a counselor at the same time. You are more likely to succeed when you do both.  If you are pregnant or breastfeeding, talk with your doctor about counseling or other ways to quit smoking. Do not take medicine to help you quit smoking unless your doctor tells you to do so. To quit smoking: Quit right away  Quit smoking totally, instead of slowly cutting back on how much you smoke over a period of time.  Go to counseling. You are more likely to quit if you go to counseling sessions regularly. Take medicine You may take medicines to help you quit. Some  medicines need a prescription, and some you can buy over-the-counter. Some medicines may contain a drug called nicotine to replace the nicotine in cigarettes. Medicines may:  Help you to stop having the desire to smoke (cravings).  Help to stop the problems that come when you stop smoking (withdrawal symptoms). Your doctor may ask you to use:  Nicotine patches, gum, or lozenges.  Nicotine inhalers or sprays.  Non-nicotine medicine that is taken by mouth. Find resources Find resources and other ways to help you quit smoking and remain smoke-free after you quit. These resources are most helpful when you use them often. They include:  Online chats with a counselor.  Phone quitlines.  Printed self-help materials.  Support groups or group counseling.  Text messaging programs.  Mobile phone apps. Use apps on your mobile phone or tablet that can help you stick to your quit plan. There are many free apps for mobile phones and tablets as well as websites. Examples include Quit Guide from the CDC and smokefree.gov  What things can I do to make it easier to quit?   Talk to your family and friends. Ask them to support and encourage you.  Call a phone quitline (1-800-QUIT-NOW), reach out to support groups, or work with a counselor.  Ask people who smoke to not smoke around you.  Avoid places that make you want to smoke,   such as: ? Bars. ? Parties. ? Smoke-break areas at work.  Spend time with people who do not smoke.  Lower the stress in your life. Stress can make you want to smoke. Try these things to help your stress: ? Getting regular exercise. ? Doing deep-breathing exercises. ? Doing yoga. ? Meditating. ? Doing a body scan. To do this, close your eyes, focus on one area of your body at a time from head to toe. Notice which parts of your body are tense. Try to relax the muscles in those areas. How will I feel when I quit smoking? Day 1 to 3 weeks Within the first 24 hours,  you may start to have some problems that come from quitting tobacco. These problems are very bad 2-3 days after you quit, but they do not often last for more than 2-3 weeks. You may get these symptoms:  Mood swings.  Feeling restless, nervous, angry, or annoyed.  Trouble concentrating.  Dizziness.  Strong desire for high-sugar foods and nicotine.  Weight gain.  Trouble pooping (constipation).  Feeling like you may vomit (nausea).  Coughing or a sore throat.  Changes in how the medicines that you take for other issues work in your body.  Depression.  Trouble sleeping (insomnia). Week 3 and afterward After the first 2-3 weeks of quitting, you may start to notice more positive results, such as:  Better sense of smell and taste.  Less coughing and sore throat.  Slower heart rate.  Lower blood pressure.  Clearer skin.  Better breathing.  Fewer sick days. Quitting smoking can be hard. Do not give up if you fail the first time. Some people need to try a few times before they succeed. Do your best to stick to your quit plan, and talk with your doctor if you have any questions or concerns. Summary  Smoking tobacco is the leading cause of preventable death. Quitting smoking can be hard, but it is one of the best things that you can do for your health.  When you decide to quit smoking, make a plan to help you succeed.  Quit smoking right away, not slowly over a period of time.  When you start quitting, seek help from your doctor, family, or friends. This information is not intended to replace advice given to you by your health care provider. Make sure you discuss any questions you have with your health care provider. Document Released: 07/21/2009 Document Revised: 12/12/2018 Document Reviewed: 12/13/2018 Elsevier Patient Education  2020 Elsevier Inc.  

## 2019-08-03 NOTE — Patient Instructions (Signed)

## 2019-08-03 NOTE — Progress Notes (Signed)
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  PRIOR THERAPY: None  CURRENT THERAPY: Palliative systemic chemotherapy with carboplatin for AUC of 5 on day 1, etoposide 100 mg/M2 on days 1, 2 and 3 as well as Imfinzi 1500 mg every 3 weeks with the chemotherapy and Neulasta support. First dose on 03/16/2019. Status post 6 cycles.   Starting from cycle #5 the patient will be treated with maintenance treatment with Imfinzi 1500 mg IV every 4 weeks.  INTERVAL HISTORY: Brittany Marks 78 y.o. female returns to the clinic today for follow-up visit.  The patient is feeling fine today with no concerning complaints except for shortness of breath with exertion.  She denied having any chest pain, cough or hemoptysis.  She denied having any fever or chills.  She has no nausea, vomiting, diarrhea or constipation.  She has no headache or visual changes.  She has no recent weight loss or night sweats.  The patient has been tolerating her treatment with Imfinzi fairly well.  She had repeat CT scan of the chest, abdomen pelvis performed recently and she is here today for evaluation and discussion of her scan results.  MEDICAL HISTORY: Past Medical History:  Diagnosis Date  . Cataract    Bilateral  . Constipation    pain medication  . Left rotator cuff tear   . Right rotator cuff tear   . SCL CA dx'd 11/2018    ALLERGIES:  is allergic to diclofenac.  MEDICATIONS:  Current Outpatient Medications  Medication Sig Dispense Refill  . b complex vitamins tablet Take 1 tablet by mouth daily.    . COD LIVER OIL PO Take  1 tablet by mouth daily.     Marland Kitchen lidocaine-prilocaine (EMLA) cream Apply 1 application topically as needed. 30 g 0  . Multiple Vitamin (MULTIVITAMIN) tablet Take 1 tablet by mouth daily.    . nicotine (NICODERM CQ) 21 mg/24hr patch Place 1 patch (21 mg total) onto the skin daily. 28 patch 0  . oxymetazoline (AFRIN) 0.05 % nasal spray Place 2 sprays into the nose 2 (two) times daily as needed for congestion.    Vladimir Faster Glycol-Propyl Glycol (SYSTANE) 0.4-0.3 % SOLN Apply 1 drop to eye 2 (two) times daily as needed (dry eyes).    . prochlorperazine (COMPAZINE) 10 MG tablet Take 1 tablet (10 mg total) by mouth every 6 (six) hours as needed for nausea or vomiting. 30 tablet 0  . sodium chloride (OCEAN) 0.65 % nasal spray Place 1 spray into the nose 2 (two) times daily as needed for congestion.    . traMADol (ULTRAM) 50 MG tablet TAKE 1 TABLET BY MOUTH EVERY 6 TO 8 HOURS AS NEEDED FOR PAIN    . vitamin C (ASCORBIC ACID) 500 MG tablet Take 500 mg by mouth daily.    . vitamin E 400 UNIT capsule Take 400 Units by mouth daily.     No current facility-administered medications for this visit.    Facility-Administered Medications Ordered in Other Visits  Medication Dose Route Frequency Provider Last Rate Last Dose  . heparin lock flush 100 unit/mL  500 Units Intracatheter  Once PRN Magrinat, Virgie Dad, MD      . sodium chloride flush (NS) 0.9 % injection 10 mL  10 mL Intracatheter PRN Magrinat, Virgie Dad, MD      . sodium chloride flush (NS) 0.9 % injection 10 mL  10 mL Intracatheter PRN Magrinat, Virgie Dad, MD   10 mL at 08/03/19 0940    SURGICAL HISTORY:  Past Surgical History:  Procedure Laterality Date  . CATARACT EXTRACTION W/ INTRAOCULAR LENS  IMPLANT, BILATERAL  2012  . DECOMPRESSIVE LUMBAR LAMINECTOMY LEVEL 2 N/A 07/29/2013   Procedure: LUMBAR LAMINECTOMY, DECOMPRESSION LUMBAR THREE TO FOUR, FOUR TO FIVE microdiscectomy l3,4 right;  Surgeon: Tobi Bastos, MD;  Location: [facility];  Service:  Orthopedics;  Laterality: N/A;  . I & D SUPERIOR RIGHT SHOULDER AND CLOSURE WOUND  01-10-2011   S/P ROTATOR CUFF REPAIR  . IR IMAGING GUIDED PORT INSERTION  04/09/2019  . LUMBAR LAMINECTOMY  1970'S  . LUMBAR LAMINECTOMY/DECOMPRESSION MICRODISCECTOMY N/A 06/27/2016   Procedure: L1 - L2 DISCECTOMY;  Surgeon: Melina Schools, MD;  Location: [facility];  Service: Orthopedics;  Laterality: N/A;  . [place]  . RIGHT SHOULDER ARTHROSCOPY/ OPEN DISTAL CLAVICLE RESECTION/ SAD/ OPEN ROTATOR CUFF REPAIR  11-28-2010  . SHOULDER OPEN ROTATOR CUFF REPAIR  12/19/2011   Procedure: ROTATOR CUFF REPAIR SHOULDER OPEN;  Surgeon: Magnus Sinning, MD;  Location: [facility];  Service: Orthopedics;  Laterality: Right;  RIGHT RECURRENT OPEN REPAIR OF THE ROTATOR CUFF WITH TISSUE MEND GRAFTANTERIOR CHROMIOECTOMY  . VAGINAL HYSTERECTOMY  1979    REVIEW OF SYSTEMS:  Constitutional: positive for fatigue Eyes: negative Ears, nose, mouth, throat, and face: negative Respiratory: positive for dyspnea on exertion Cardiovascular: negative Gastrointestinal: negative Genitourinary:negative Integument/breast: negative Hematologic/lymphatic: negative Musculoskeletal:negative Neurological: negative Behavioral/Psych: negative Endocrine: negative Allergic/Immunologic: negative   PHYSICAL EXAMINATION: General appearance: alert, cooperative, fatigued and no distress Head: Normocephalic, without obvious abnormality, atraumatic Neck: no adenopathy, no JVD, supple, symmetrical, trachea midline and thyroid not enlarged, symmetric, no tenderness/mass/nodules Lymph nodes: Cervical, supraclavicular, and axillary nodes normal. Resp: clear to auscultation bilaterally Back: symmetric, no curvature. ROM normal. No CVA tenderness. Cardio: regular rate and rhythm, S1, S2 normal, no murmur, click, rub or gallop GI: soft, non-tender; bowel sounds normal; no masses,  no organomegaly Extremities: extremities  normal, atraumatic, no cyanosis or edema Neurologic: Alert and oriented X 3, normal strength and tone. Normal symmetric reflexes. Normal coordination and gait   ECOG PERFORMANCE STATUS: 1 - Symptomatic but completely ambulatory  Blood pressure (!) 163/79, pulse 75, temperature 98.5 F (36.9 C), temperature source Temporal, resp. rate 17, height 5\' 2"  (1.575 m), weight 131 lb 9.6 oz (59.7 kg), SpO2 95 %.  LABORATORY DATA: Lab Results  Component Value Date   WBC 7.6 08/03/2019   HGB 11.7 (L) 08/03/2019   HCT 35.1 (L) 08/03/2019   MCV 94.6 08/03/2019   PLT 231 08/03/2019      Chemistry      Component Value Date/Time   NA 138 07/06/2019 1055   K 3.9 07/06/2019 1055   CL 105 07/06/2019 1055   CO2 25 07/06/2019 1055   BUN 17 07/06/2019 1055   CREATININE 0.73 07/06/2019 1055   CREATININE 0.73 06/01/2019 1305      Component Value Date/Time   CALCIUM 9.2 07/06/2019 1055   ALKPHOS 73 07/06/2019 1055   AST 19 07/06/2019 1055   AST 22 06/01/2019 1305   ALT 12 07/06/2019 1055   ALT 15 06/01/2019 1305  BILITOT 0.3 07/06/2019 1055   BILITOT 0.3 06/01/2019 1305       RADIOGRAPHIC STUDIES: Ct Chest W Contrast  Result Date: 07/28/2019 CLINICAL DATA:  Restaging small cell lung cancer EXAM: CT CHEST, ABDOMEN, AND PELVIS WITH CONTRAST TECHNIQUE: Multidetector CT imaging of the chest, abdomen and pelvis was performed following the standard protocol during bolus administration of intravenous contrast. CONTRAST:  14mL OMNIPAQUE IOHEXOL 300 MG/ML SOLN, additional oral enteric contrast COMPARISON:  06/02/2019, 04/20/2019, 01/01/2019 FINDINGS: CT CHEST FINDINGS Cardiovascular: Right chest port catheter. Aortic atherosclerosis. Mild cardiomegaly. Three-vessel coronary artery calcifications. No pericardial effusion. Mediastinum/Nodes: Unchanged prominent anterior mediastinal and pretracheal lymph nodes (series 2, image 20). No persistent or recurrent hilar lymphadenopathy. Thyroid gland, trachea,  and esophagus demonstrate no significant findings. Lungs/Pleura: Mild centrilobular emphysema. Moderate, bibasilar predominant pulmonary fibrosis with mild, predominantly tubular bronchiectasis, dependent irregular peripheral interstitial opacity and areas of bronchiolectasis in the lung bases. No clear evidence of honeycombing. No pleural effusion or pneumothorax. Musculoskeletal: No chest wall mass. 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 Severe bilateral glenohumeral arthrosis. CT ABDOMEN PELVIS FINDINGS Hepatobiliary: Unchanged, ill-defined soft tissue of the porta hepatis at the site of a previously seen more discrete bulky mass (series 2, image 52). No gallstones, gallbladder wall thickening, or biliary dilatation. Pancreas: Unremarkable. No pancreatic ductal dilatation or surrounding inflammatory changes. Spleen: Normal in size without significant abnormality. Adrenals/Urinary Tract: Adrenal glands are unremarkable. Unchanged large right extrarenal pelvis. Kidneys are otherwise normal, without renal calculi, solid lesion, or hydronephrosis. Bladder is unremarkable. Stomach/Bowel: Stomach is within normal limits. Appendix appears normal. No evidence of bowel wall thickening, distention, or inflammatory changes. Vascular/Lymphatic: Aortic atherosclerosis. No persistent enlarged abdominal or pelvic lymph nodes. Reproductive: Status post hysterectomy. Other: No abdominal wall hernia or abnormality. No abdominopelvic ascites. Musculoskeletal: No acute or significant osseous findings. Disc degenerative disease and osteophytosis of the thoracolumbar spine. IMPRESSION: 1. Stable post treatment appearance of the chest, abdomen, and pelvis. Unchanged prominent anterior mediastinal and pretracheal lymph nodes (series 2, image 20). No persistent or recurrent hilar lymphadenopathy. No persistent or recurrent abdominal or pelvic lymphadenopathy. 2. Unchanged, ill-defined soft tissue of the  porta hepatis at the site of a previously seen more discrete bulky mass (series 2, image 52). 3. 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 4. Moderate, bibasilar predominant pulmonary fibrosis with mild, predominantly tubular bronchiectasis, dependent irregular peripheral interstitial opacity and areas of bronchiolectasis in the lung bases. No clear evidence of honeycombing. Findings are categorized as probable UIP per consensus guidelines: Diagnosis of Idiopathic Pulmonary Fibrosis: An Official ATS/ERS/JRS/ALAT Clinical Practice Guideline. [place], Iss 5, [phone], Jun 08 2017. 5.  Emphysema (ICD10-J43.9). 6.  Coronary artery disease.  Aortic Atherosclerosis (ICD10-I70.0). Electronically Signed   By: Eddie Candle M.D.   On: 07/28/2019 14:48   Ct Abdomen Pelvis W Contrast  Result Date: 07/28/2019 CLINICAL DATA:  Restaging small cell lung cancer EXAM: CT CHEST, ABDOMEN, AND PELVIS WITH CONTRAST TECHNIQUE: Multidetector CT imaging of the chest, abdomen and pelvis was performed following the standard protocol during bolus administration of intravenous contrast. CONTRAST:  119mL OMNIPAQUE IOHEXOL 300 MG/ML SOLN, additional oral enteric contrast COMPARISON:  06/02/2019, 04/20/2019, 01/01/2019 FINDINGS: CT CHEST FINDINGS Cardiovascular: Right chest port catheter. Aortic atherosclerosis. Mild cardiomegaly. Three-vessel coronary artery calcifications. No pericardial effusion. Mediastinum/Nodes: Unchanged prominent anterior mediastinal and pretracheal lymph nodes (series 2, image 20). No persistent or recurrent hilar lymphadenopathy.  Thyroid gland, trachea, and esophagus demonstrate no significant findings. Lungs/Pleura: Mild centrilobular emphysema. Moderate, bibasilar predominant pulmonary fibrosis with mild, predominantly tubular bronchiectasis, dependent irregular peripheral interstitial opacity and  areas of bronchiolectasis in the lung bases. No clear evidence of honeycombing. No pleural effusion or pneumothorax. Musculoskeletal: No chest wall mass. 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 Severe bilateral glenohumeral arthrosis. CT ABDOMEN PELVIS FINDINGS Hepatobiliary: Unchanged, ill-defined soft tissue of the porta hepatis at the site of a previously seen more discrete bulky mass (series 2, image 52). No gallstones, gallbladder wall thickening, or biliary dilatation. Pancreas: Unremarkable. No pancreatic ductal dilatation or surrounding inflammatory changes. Spleen: Normal in size without significant abnormality. Adrenals/Urinary Tract: Adrenal glands are unremarkable. Unchanged large right extrarenal pelvis. Kidneys are otherwise normal, without renal calculi, solid lesion, or hydronephrosis. Bladder is unremarkable. Stomach/Bowel: Stomach is within normal limits. Appendix appears normal. No evidence of bowel wall thickening, distention, or inflammatory changes. Vascular/Lymphatic: Aortic atherosclerosis. No persistent enlarged abdominal or pelvic lymph nodes. Reproductive: Status post hysterectomy. Other: No abdominal wall hernia or abnormality. No abdominopelvic ascites. Musculoskeletal: No acute or significant osseous findings. Disc degenerative disease and osteophytosis of the thoracolumbar spine. IMPRESSION: 1. Stable post treatment appearance of the chest, abdomen, and pelvis. Unchanged prominent anterior mediastinal and pretracheal lymph nodes (series 2, image 20). No persistent or recurrent hilar lymphadenopathy. No persistent or recurrent abdominal or pelvic lymphadenopathy. 2. Unchanged, ill-defined soft tissue of the porta hepatis at the site of a previously seen more discrete bulky mass (series 2, image 52). 3. 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 4. Moderate,  bibasilar predominant pulmonary fibrosis with mild, predominantly tubular bronchiectasis, dependent irregular peripheral interstitial opacity and areas of bronchiolectasis in the lung bases. No clear evidence of honeycombing. Findings are categorized as probable UIP per consensus guidelines: Diagnosis of Idiopathic Pulmonary Fibrosis: An Official ATS/ERS/JRS/ALAT Clinical Practice Guideline. [place], Iss 5, [phone], Jun 08 2017. 5.  Emphysema (ICD10-J43.9). 6.  Coronary artery disease.  Aortic Atherosclerosis (ICD10-I70.0). Electronically Signed   By: Eddie Candle M.D.   On: 07/28/2019 14:48    ASSESSMENT AND PLAN: This is a very pleasant 78 years old white female with extensive stage small cell lung cancer and she is currently undergoing treatment with carboplatin, etoposide and Imfinzi status post 5 cycles.  Starting from cycle #5 the patient is on maintenance treatment with single agent Imfinzi every 4 weeks status post 2 cycles. The patient has been tolerating her treatment well with no concerning complaints. She had repeat CT scan of the chest, abdomen pelvis performed recently.  I personally and independently reviewed the scans and discussed the results with the patient today. Her scan showed no concerning findings for disease progression. I recommended for the patient to continue her current treatment with maintenance Imfinzi every 4 weeks. For hypertension, the patient was advised to monitor her blood pressure closely at home and to report to her primary care physician if no improvement. She was advised to call immediately if she has any concerning symptoms in the interval. The patient voices understanding of current disease status and treatment options and is in agreement with the current care plan.  All questions were answered. The patient knows to call the clinic with any problems, questions or concerns. We can certainly see the patient much sooner if necessary.   Disclaimer: This note was dictated with voice recognition software. Similar  sounding words can inadvertently be transcribed and may not be corrected upon review.

## 2019-08-03 NOTE — Patient Instructions (Signed)
[facility] Discharge Instructions for Patients Receiving Chemotherapy  Today you received the following Immunotherapy agent: Durvalumab  To help prevent nausea and vomiting after your treatment, we encourage you to take your nausea medication as directed by your MD.   If you develop nausea and vomiting that is not controlled by your nausea medication, call the clinic.   BELOW ARE SYMPTOMS THAT SHOULD BE REPORTED IMMEDIATELY:  *FEVER GREATER THAN 100.5 F  *CHILLS WITH OR WITHOUT FEVER  NAUSEA AND VOMITING THAT IS NOT CONTROLLED WITH YOUR NAUSEA MEDICATION  *UNUSUAL SHORTNESS OF BREATH  *UNUSUAL BRUISING OR BLEEDING  TENDERNESS IN MOUTH AND THROAT WITH OR WITHOUT PRESENCE OF ULCERS  *URINARY PROBLEMS  *BOWEL PROBLEMS  UNUSUAL RASH Items with * indicate a potential emergency and should be followed up as soon as possible.  Feel free to call the clinic should you have any questions or concerns. The clinic phone number is [phone].  Please show the [facility] at check-in to the Emergency Department and triage nurse.  Coronavirus (COVID-19) Are you at risk?  Are you at risk for the Coronavirus (COVID-19)?  To be considered HIGH RISK for Coronavirus (COVID-19), you have to meet the following criteria:  . Traveled to [place], [place], [place], [place] or [place]; or in the [place] to [place], [place], [place], or [place]; and have fever, cough, and shortness of breath within the last 2 weeks of travel OR . Been in close contact with a person diagnosed with COVID-19 within the last 2 weeks and have fever, cough, and shortness of breath . IF YOU DO NOT MEET THESE CRITERIA, YOU ARE CONSIDERED LOW RISK FOR COVID-19.  What to do if you are HIGH RISK for COVID-19?  Marland Kitchen If you are having a medical emergency, call 911. . Seek medical care right away. Before you go to a doctor's office, urgent care or emergency department, call ahead and tell them  about your recent travel, contact with someone diagnosed with COVID-19, and your symptoms. You should receive instructions from your physician's office regarding next steps of care.  . When you arrive at healthcare provider, tell the healthcare staff immediately you have returned from visiting [place], [place], [place], [place] or [place]; or traveled in the [place] to [place], [place], [place], or [place]; in the last two weeks or you have been in close contact with a person diagnosed with COVID-19 in the last 2 weeks.   . Tell the health care staff about your symptoms: fever, cough and shortness of breath. . After you have been seen by a medical provider, you will be either: o Tested for (COVID-19) and discharged home on quarantine except to seek medical care if symptoms worsen, and asked to  - Stay home and avoid contact with others until you get your results (4-5 days)  - Avoid travel on public transportation if possible (such as bus, train, or airplane) or o Sent to the Emergency Department by EMS for evaluation, COVID-19 testing, and possible admission depending on your condition and test results.  What to do if you are LOW RISK for COVID-19?  Reduce your risk of any infection by using the same precautions used for avoiding the common cold or flu:  Marland Kitchen Wash your hands often with soap and warm water for at least 20 seconds.  If soap and water are not readily available, use an alcohol-based hand sanitizer with at least 60% alcohol.  . If  coughing or sneezing, cover your mouth and nose by coughing or sneezing into the elbow areas of your shirt or coat, into a tissue or into your sleeve (not your hands). . Avoid shaking hands with others and consider head nods or verbal greetings only. . Avoid touching your eyes, nose, or mouth with unwashed hands.  . Avoid close contact with people who are sick. . Avoid places or events with large numbers of people in one location, like concerts or  sporting events. . Carefully consider travel plans you have or are making. . If you are planning any travel outside or inside the [place], visit the CDC's Travelers' Health webpage for the latest health notices. . If you have some symptoms but not all symptoms, continue to monitor at home and seek medical attention if your symptoms worsen. . If you are having a medical emergency, call 911.   [place] / e-Visit: [email or web address]         [facility]: [facility]: [phone]                   [facility]: [phone]

## 2019-08-04 ENCOUNTER — Telehealth: Payer: Self-pay | Admitting: Internal Medicine

## 2019-08-04 NOTE — Telephone Encounter (Signed)
Scheduled per los. Called and left msg. Mailed printout  °

## 2019-08-31 ENCOUNTER — Ambulatory Visit: Payer: Federal, State, Local not specified - PPO

## 2019-08-31 ENCOUNTER — Other Ambulatory Visit: Payer: Federal, State, Local not specified - PPO

## 2019-08-31 ENCOUNTER — Ambulatory Visit: Payer: Federal, State, Local not specified - PPO | Admitting: Physician Assistant

## 2019-09-01 ENCOUNTER — Inpatient Hospital Stay: Payer: Federal, State, Local not specified - PPO

## 2019-09-01 ENCOUNTER — Encounter: Payer: Self-pay | Admitting: Internal Medicine

## 2019-09-01 ENCOUNTER — Other Ambulatory Visit: Payer: Self-pay

## 2019-09-01 ENCOUNTER — Inpatient Hospital Stay: Payer: Federal, State, Local not specified - PPO | Attending: Oncology | Admitting: Internal Medicine

## 2019-09-01 VITALS — BP 160/83 | HR 83 | Temp 98.2°F | Resp 17 | Ht 62.0 in | Wt 131.8 lb

## 2019-09-01 DIAGNOSIS — C3411 Malignant neoplasm of upper lobe, right bronchus or lung: Secondary | ICD-10-CM | POA: Insufficient documentation

## 2019-09-01 DIAGNOSIS — K5900 Constipation, unspecified: Secondary | ICD-10-CM | POA: Insufficient documentation

## 2019-09-01 DIAGNOSIS — Z5112 Encounter for antineoplastic immunotherapy: Secondary | ICD-10-CM | POA: Diagnosis not present

## 2019-09-01 DIAGNOSIS — C7951 Secondary malignant neoplasm of bone: Secondary | ICD-10-CM

## 2019-09-01 DIAGNOSIS — R5383 Other fatigue: Secondary | ICD-10-CM | POA: Diagnosis not present

## 2019-09-01 DIAGNOSIS — Z95828 Presence of other vascular implants and grafts: Secondary | ICD-10-CM

## 2019-09-01 DIAGNOSIS — Z79899 Other long term (current) drug therapy: Secondary | ICD-10-CM | POA: Insufficient documentation

## 2019-09-01 DIAGNOSIS — I10 Essential (primary) hypertension: Secondary | ICD-10-CM | POA: Diagnosis not present

## 2019-09-01 DIAGNOSIS — Z1211 Encounter for screening for malignant neoplasm of colon: Secondary | ICD-10-CM

## 2019-09-01 DIAGNOSIS — C7952 Secondary malignant neoplasm of bone marrow: Secondary | ICD-10-CM | POA: Diagnosis not present

## 2019-09-01 DIAGNOSIS — R937 Abnormal findings on diagnostic imaging of other parts of musculoskeletal system: Secondary | ICD-10-CM

## 2019-09-01 LAB — COMPREHENSIVE METABOLIC PANEL
ALT: 15 U/L (ref 0–44)
AST: 21 U/L (ref 15–41)
Albumin: 3.8 g/dL (ref 3.5–5.0)
Alkaline Phosphatase: 72 U/L (ref 38–126)
Anion gap: 9 (ref 5–15)
BUN: 15 mg/dL (ref 8–23)
CO2: 25 mmol/L (ref 22–32)
Calcium: 9.1 mg/dL (ref 8.9–10.3)
Chloride: 104 mmol/L (ref 98–111)
Creatinine, Ser: 0.74 mg/dL (ref 0.44–1.00)
GFR calc Af Amer: >60 mL/min (ref >60)
GFR calc non Af Amer: >60 mL/min (ref >60)
Glucose, Bld: 96 mg/dL (ref 70–99)
Potassium: 3.9 mmol/L (ref 3.5–5.1)
Sodium: 138 mmol/L (ref 135–145)
Total Bilirubin: 0.3 mg/dL (ref 0.3–1.2)
Total Protein: 6.9 g/dL (ref 6.5–8.1)

## 2019-09-01 LAB — CBC WITH DIFFERENTIAL/PLATELET
Abs Immature Granulocytes: 0.02 10*3/uL (ref 0.00–0.07)
Basophils Absolute: 0 10*3/uL (ref 0.0–0.1)
Basophils Relative: 0 %
Eosinophils Absolute: 0.1 10*3/uL (ref 0.0–0.5)
Eosinophils Relative: 1 %
HCT: 36.3 % (ref 36.0–46.0)
Hemoglobin: 12 g/dL (ref 12.0–15.0)
Immature Granulocytes: 0 %
Lymphocytes Relative: 23 %
Lymphs Abs: 2 10*3/uL (ref 0.7–4.0)
MCH: 31 pg (ref 26.0–34.0)
MCHC: 33.1 g/dL (ref 30.0–36.0)
MCV: 93.8 fL (ref 80.0–100.0)
Monocytes Absolute: 0.7 10*3/uL (ref 0.1–1.0)
Monocytes Relative: 8 %
Neutro Abs: 5.7 10*3/uL (ref 1.7–7.7)
Neutrophils Relative %: 68 %
Platelets: 198 10*3/uL (ref 150–400)
RBC: 3.87 MIL/uL (ref 3.87–5.11)
RDW: 13.2 % (ref 11.5–15.5)
WBC: 8.6 10*3/uL (ref 4.0–10.5)
nRBC: 0 % (ref 0.0–0.2)

## 2019-09-01 LAB — TSH: TSH: 2.07 u[IU]/mL (ref 0.308–3.960)

## 2019-09-01 MED ORDER — HEPARIN SOD (PORK) LOCK FLUSH 100 UNIT/ML IV SOLN
500.0000 [IU] | Freq: Once | INTRAVENOUS | Status: AC | PRN
  Administered 2019-09-01: 500 [IU]
  Filled 2019-09-01: qty 5

## 2019-09-01 MED ORDER — SODIUM CHLORIDE 0.9 % IV SOLN
1500.0000 mg | Freq: Once | INTRAVENOUS | Status: AC
  Administered 2019-09-01: 1500 mg via INTRAVENOUS
  Filled 2019-09-01: qty 30

## 2019-09-01 MED ORDER — SODIUM CHLORIDE 0.9% FLUSH
10.0000 mL | INTRAVENOUS | Status: DC | PRN
  Administered 2019-09-01: 10 mL
  Filled 2019-09-01: qty 10

## 2019-09-01 MED ORDER — SODIUM CHLORIDE 0.9 % IV SOLN
Freq: Once | INTRAVENOUS | Status: AC
  Administered 2019-09-01: 13:00:00 via INTRAVENOUS
  Filled 2019-09-01: qty 250

## 2019-09-01 NOTE — Progress Notes (Signed)
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  PRIOR THERAPY: None  CURRENT THERAPY: Palliative systemic chemotherapy with carboplatin for AUC of 5 on day 1, etoposide 100 mg/M2 on days 1, 2 and 3 as well as Imfinzi 1500 mg every 3 weeks with the chemotherapy and Neulasta support. First dose on 03/16/2019. Status post 6 cycles.   Starting from cycle #5 the patient will be treated with maintenance treatment with Imfinzi 1500 mg IV every 4 weeks.  INTERVAL HISTORY: Brittany Marks 78 y.o. female returns to the clinic today for follow-up visit.  The patient is feeling fine today with no concerning complaints except for mild fatigue.  She is tolerating her maintenance treatment with Imfinzi fairly well.  She denied having any current chest pain, shortness of breath except with exertion with no cough or hemoptysis.  She denied having any fever or chills.  She has no nausea, vomiting, diarrhea or constipation.  She denied having any headache or visual changes.  She is here today for evaluation before starting cycle #8 of her treatment.  MEDICAL HISTORY: Past Medical History:  Diagnosis Date  . Cataract    Bilateral  . Constipation    pain medication  . Left rotator cuff tear   . Right rotator cuff tear   . SCL CA dx'd 11/2018    ALLERGIES:  is allergic to diclofenac.  MEDICATIONS:  Current Outpatient Medications  Medication Sig Dispense Refill  . b complex vitamins tablet Take 1 tablet by mouth daily.    . COD LIVER OIL PO Take 1 tablet by mouth daily.     Marland Kitchen lidocaine-prilocaine (EMLA) cream  Apply 1 application topically as needed. 30 g 0  . Multiple Vitamin (MULTIVITAMIN) tablet Take 1 tablet by mouth daily.    . nicotine (NICODERM CQ) 21 mg/24hr patch Place 1 patch (21 mg total) onto the skin daily. 28 patch 0  . oxymetazoline (AFRIN) 0.05 % nasal spray Place 2 sprays into the nose 2 (two) times daily as needed for congestion.    Vladimir Faster Glycol-Propyl Glycol (SYSTANE) 0.4-0.3 % SOLN Apply 1 drop to eye 2 (two) times daily as needed (dry eyes).    . prochlorperazine (COMPAZINE) 10 MG tablet Take 1 tablet (10 mg total) by mouth every 6 (six) hours as needed for nausea or vomiting. (Patient not taking: Reported on 08/03/2019) 30 tablet 0  . sodium chloride (OCEAN) 0.65 % nasal spray Place 1 spray into the nose 2 (two) times daily as needed for congestion.    . traMADol (ULTRAM) 50 MG tablet TAKE 1 TABLET BY MOUTH EVERY 6 TO 8 HOURS AS NEEDED FOR PAIN    . vitamin C (ASCORBIC ACID) 500 MG tablet Take 500 mg by mouth daily.    . vitamin E 400 UNIT capsule Take 400 Units by mouth daily.     No current facility-administered medications for this visit.    Facility-Administered Medications Ordered in Other Visits  Medication Dose Route Frequency Provider Last Rate Last Dose  . heparin lock flush 100 unit/mL  500 Units Intracatheter Once PRN Magrinat, Virgie Dad, MD      .  sodium chloride flush (NS) 0.9 % injection 10 mL  10 mL Intracatheter PRN Magrinat, Virgie Dad, MD        SURGICAL HISTORY:  Past Surgical History:  Procedure Laterality Date  . CATARACT EXTRACTION W/ INTRAOCULAR LENS  IMPLANT, BILATERAL  2012  . DECOMPRESSIVE LUMBAR LAMINECTOMY LEVEL 2 N/A 07/29/2013   Procedure: LUMBAR LAMINECTOMY, DECOMPRESSION LUMBAR THREE TO FOUR, FOUR TO FIVE microdiscectomy l3,4 right;  Surgeon: Tobi Bastos, MD;  Location: [facility];  Service: Orthopedics;  Laterality: N/A;  . I & D SUPERIOR RIGHT SHOULDER AND CLOSURE WOUND  01-10-2011   S/P ROTATOR CUFF REPAIR  . IR IMAGING GUIDED PORT  INSERTION  04/09/2019  . LUMBAR LAMINECTOMY  1970'S  . LUMBAR LAMINECTOMY/DECOMPRESSION MICRODISCECTOMY N/A 06/27/2016   Procedure: L1 - L2 DISCECTOMY;  Surgeon: Melina Schools, MD;  Location: [facility];  Service: Orthopedics;  Laterality: N/A;  . [place]  . RIGHT SHOULDER ARTHROSCOPY/ OPEN DISTAL CLAVICLE RESECTION/ SAD/ OPEN ROTATOR CUFF REPAIR  11-28-2010  . SHOULDER OPEN ROTATOR CUFF REPAIR  12/19/2011   Procedure: ROTATOR CUFF REPAIR SHOULDER OPEN;  Surgeon: Magnus Sinning, MD;  Location: [facility];  Service: Orthopedics;  Laterality: Right;  RIGHT RECURRENT OPEN REPAIR OF THE ROTATOR CUFF WITH TISSUE MEND GRAFTANTERIOR CHROMIOECTOMY  . VAGINAL HYSTERECTOMY  1979    REVIEW OF SYSTEMS:  A comprehensive review of systems was negative except for: Constitutional: positive for fatigue Respiratory: positive for dyspnea on exertion   PHYSICAL EXAMINATION: General appearance: alert, cooperative, fatigued and no distress Head: Normocephalic, without obvious abnormality, atraumatic Neck: no adenopathy, no JVD, supple, symmetrical, trachea midline and thyroid not enlarged, symmetric, no tenderness/mass/nodules Lymph nodes: Cervical, supraclavicular, and axillary nodes normal. Resp: clear to auscultation bilaterally Back: symmetric, no curvature. ROM normal. No CVA tenderness. Cardio: regular rate and rhythm, S1, S2 normal, no murmur, click, rub or gallop GI: soft, non-tender; bowel sounds normal; no masses,  no organomegaly Extremities: extremities normal, atraumatic, no cyanosis or edema   ECOG PERFORMANCE STATUS: 1 - Symptomatic but completely ambulatory  Blood pressure (!) 160/83, pulse 83, temperature 98.2 F (36.8 C), temperature source Temporal, resp. rate 17, height 5\' 2"  (1.575 m), weight 131 lb 12.8 oz (59.8 kg), SpO2 95 %.  LABORATORY DATA: Lab Results  Component Value Date   WBC 8.6 09/01/2019   HGB 12.0 09/01/2019   HCT 36.3 09/01/2019   MCV 93.8  09/01/2019   PLT 198 09/01/2019      Chemistry      Component Value Date/Time   NA 137 08/03/2019 0947   K 3.9 08/03/2019 0947   CL 104 08/03/2019 0947   CO2 23 08/03/2019 0947   BUN 20 08/03/2019 0947   CREATININE 0.79 08/03/2019 0947   CREATININE 0.73 06/01/2019 1305      Component Value Date/Time   CALCIUM 9.6 08/03/2019 0947   ALKPHOS 70 08/03/2019 0947   AST 20 08/03/2019 0947   AST 22 06/01/2019 1305   ALT 11 08/03/2019 0947   ALT 15 06/01/2019 1305   BILITOT 0.3 08/03/2019 0947   BILITOT 0.3 06/01/2019 1305       RADIOGRAPHIC STUDIES: No results found.  ASSESSMENT AND PLAN: This is a very pleasant 78 years old white female with extensive stage small cell lung cancer and she is currently undergoing treatment with carboplatin, etoposide and Imfinzi status post 5 cycles.  Starting from cycle #5 the patient is on maintenance treatment with single agent Imfinzi every 4  weeks status post 3 cycles. The patient has been tolerating this treatment well with no concerning adverse effects. I recommended for her to proceed with cycle #4 of her maintenance therapy today as planned. For hypertension she was advised to take her blood pressure medication as prescribed and to monitor it closely at home. The patient was advised to call immediately if she has any concerning symptoms in the interval. The patient voices understanding of current disease status and treatment options and is in agreement with the current care plan.  All questions were answered. The patient knows to call the clinic with any problems, questions or concerns. We can certainly see the patient much sooner if necessary.  Disclaimer: This note was dictated with voice recognition software. Similar sounding words can inadvertently be transcribed and may not be corrected upon review.

## 2019-09-01 NOTE — Patient Instructions (Signed)
[facility] Discharge Instructions for Patients Receiving Chemotherapy  Today you received the following Immunotherapy agent: Durvalumab  To help prevent nausea and vomiting after your treatment, we encourage you to take your nausea medication as directed by your MD.   If you develop nausea and vomiting that is not controlled by your nausea medication, call the clinic.   BELOW ARE SYMPTOMS THAT SHOULD BE REPORTED IMMEDIATELY:  *FEVER GREATER THAN 100.5 F  *CHILLS WITH OR WITHOUT FEVER  NAUSEA AND VOMITING THAT IS NOT CONTROLLED WITH YOUR NAUSEA MEDICATION  *UNUSUAL SHORTNESS OF BREATH  *UNUSUAL BRUISING OR BLEEDING  TENDERNESS IN MOUTH AND THROAT WITH OR WITHOUT PRESENCE OF ULCERS  *URINARY PROBLEMS  *BOWEL PROBLEMS  UNUSUAL RASH Items with * indicate a potential emergency and should be followed up as soon as possible.  Feel free to call the clinic should you have any questions or concerns. The clinic phone number is [phone].  Please show the [facility] at check-in to the Emergency Department and triage nurse.  Coronavirus (COVID-19) Are you at risk?  Are you at risk for the Coronavirus (COVID-19)?  To be considered HIGH RISK for Coronavirus (COVID-19), you have to meet the following criteria:  . Traveled to [place], [place], [place], [place] or [place]; or in the [place] to [place], [place], [place], or [place]; and have fever, cough, and shortness of breath within the last 2 weeks of travel OR . Been in close contact with a person diagnosed with COVID-19 within the last 2 weeks and have fever, cough, and shortness of breath . IF YOU DO NOT MEET THESE CRITERIA, YOU ARE CONSIDERED LOW RISK FOR COVID-19.  What to do if you are HIGH RISK for COVID-19?  Marland Kitchen If you are having a medical emergency, call 911. . Seek medical care right away. Before you go to a doctor's office, urgent care or emergency department, call ahead and tell them  about your recent travel, contact with someone diagnosed with COVID-19, and your symptoms. You should receive instructions from your physician's office regarding next steps of care.  . When you arrive at healthcare provider, tell the healthcare staff immediately you have returned from visiting [place], [place], [place], [place] or [place]; or traveled in the [place] to [place], [place], [place], or [place]; in the last two weeks or you have been in close contact with a person diagnosed with COVID-19 in the last 2 weeks.   . Tell the health care staff about your symptoms: fever, cough and shortness of breath. . After you have been seen by a medical provider, you will be either: o Tested for (COVID-19) and discharged home on quarantine except to seek medical care if symptoms worsen, and asked to  - Stay home and avoid contact with others until you get your results (4-5 days)  - Avoid travel on public transportation if possible (such as bus, train, or airplane) or o Sent to the Emergency Department by EMS for evaluation, COVID-19 testing, and possible admission depending on your condition and test results.  What to do if you are LOW RISK for COVID-19?  Reduce your risk of any infection by using the same precautions used for avoiding the common cold or flu:  Marland Kitchen Wash your hands often with soap and warm water for at least 20 seconds.  If soap and water are not readily available, use an alcohol-based hand sanitizer with at least 60% alcohol.  . If  coughing or sneezing, cover your mouth and nose by coughing or sneezing into the elbow areas of your shirt or coat, into a tissue or into your sleeve (not your hands). . Avoid shaking hands with others and consider head nods or verbal greetings only. . Avoid touching your eyes, nose, or mouth with unwashed hands.  . Avoid close contact with people who are sick. . Avoid places or events with large numbers of people in one location, like concerts or  sporting events. . Carefully consider travel plans you have or are making. . If you are planning any travel outside or inside the [place], visit the CDC's Travelers' Health webpage for the latest health notices. . If you have some symptoms but not all symptoms, continue to monitor at home and seek medical attention if your symptoms worsen. . If you are having a medical emergency, call 911.   [address] / e-Visit: [email or web address]         [facility]: [facility]: [phone]                   [facility]: [phone]

## 2019-09-01 NOTE — Patient Instructions (Signed)
Steps to Quit Smoking Smoking tobacco is the leading cause of preventable death. It can affect almost every organ in the body. Smoking puts you and people around you at risk for many serious, long-lasting (chronic) diseases. Quitting smoking can be hard, but it is one of the best things that you can do for your health. It is never too late to quit. How do I get ready to quit? When you decide to quit smoking, make a plan to help you succeed. Before you quit:  Pick a date to quit. Set a date within the next 2 weeks to give you time to prepare.  Write down the reasons why you are quitting. Keep this list in places where you will see it often.  Tell your family, friends, and co-workers that you are quitting. Their support is important.  Talk with your doctor about the choices that may help you quit.  Find out if your health insurance will pay for these treatments.  Know the people, places, things, and activities that make you want to smoke (triggers). Avoid them. What first steps can I take to quit smoking?  Throw away all cigarettes at home, at work, and in your car.  Throw away the things that you use when you smoke, such as ashtrays and lighters.  Clean your car. Make sure to empty the ashtray.  Clean your home, including curtains and carpets. What can I do to help me quit smoking? Talk with your doctor about taking medicines and seeing a counselor at the same time. You are more likely to succeed when you do both.  If you are pregnant or breastfeeding, talk with your doctor about counseling or other ways to quit smoking. Do not take medicine to help you quit smoking unless your doctor tells you to do so. To quit smoking: Quit right away  Quit smoking totally, instead of slowly cutting back on how much you smoke over a period of time.  Go to counseling. You are more likely to quit if you go to counseling sessions regularly. Take medicine You may take medicines to help you quit. Some  medicines need a prescription, and some you can buy over-the-counter. Some medicines may contain a drug called nicotine to replace the nicotine in cigarettes. Medicines may:  Help you to stop having the desire to smoke (cravings).  Help to stop the problems that come when you stop smoking (withdrawal symptoms). Your doctor may ask you to use:  Nicotine patches, gum, or lozenges.  Nicotine inhalers or sprays.  Non-nicotine medicine that is taken by mouth. Find resources Find resources and other ways to help you quit smoking and remain smoke-free after you quit. These resources are most helpful when you use them often. They include:  Online chats with a counselor.  Phone quitlines.  Printed self-help materials.  Support groups or group counseling.  Text messaging programs.  Mobile phone apps. Use apps on your mobile phone or tablet that can help you stick to your quit plan. There are many free apps for mobile phones and tablets as well as websites. Examples include Quit Guide from the CDC and smokefree.gov  What things can I do to make it easier to quit?   Talk to your family and friends. Ask them to support and encourage you.  Call a phone quitline (1-800-QUIT-NOW), reach out to support groups, or work with a counselor.  Ask people who smoke to not smoke around you.  Avoid places that make you want to smoke,   such as: ? Bars. ? Parties. ? Smoke-break areas at work.  Spend time with people who do not smoke.  Lower the stress in your life. Stress can make you want to smoke. Try these things to help your stress: ? Getting regular exercise. ? Doing deep-breathing exercises. ? Doing yoga. ? Meditating. ? Doing a body scan. To do this, close your eyes, focus on one area of your body at a time from head to toe. Notice which parts of your body are tense. Try to relax the muscles in those areas. How will I feel when I quit smoking? Day 1 to 3 weeks Within the first 24 hours,  you may start to have some problems that come from quitting tobacco. These problems are very bad 2-3 days after you quit, but they do not often last for more than 2-3 weeks. You may get these symptoms:  Mood swings.  Feeling restless, nervous, angry, or annoyed.  Trouble concentrating.  Dizziness.  Strong desire for high-sugar foods and nicotine.  Weight gain.  Trouble pooping (constipation).  Feeling like you may vomit (nausea).  Coughing or a sore throat.  Changes in how the medicines that you take for other issues work in your body.  Depression.  Trouble sleeping (insomnia). Week 3 and afterward After the first 2-3 weeks of quitting, you may start to notice more positive results, such as:  Better sense of smell and taste.  Less coughing and sore throat.  Slower heart rate.  Lower blood pressure.  Clearer skin.  Better breathing.  Fewer sick days. Quitting smoking can be hard. Do not give up if you fail the first time. Some people need to try a few times before they succeed. Do your best to stick to your quit plan, and talk with your doctor if you have any questions or concerns. Summary  Smoking tobacco is the leading cause of preventable death. Quitting smoking can be hard, but it is one of the best things that you can do for your health.  When you decide to quit smoking, make a plan to help you succeed.  Quit smoking right away, not slowly over a period of time.  When you start quitting, seek help from your doctor, family, or friends. This information is not intended to replace advice given to you by your health care provider. Make sure you discuss any questions you have with your health care provider. Document Released: 07/21/2009 Document Revised: 12/12/2018 Document Reviewed: 12/13/2018 Elsevier Patient Education  2020 Elsevier Inc.  

## 2019-09-02 ENCOUNTER — Telehealth: Payer: Self-pay | Admitting: Internal Medicine

## 2019-09-02 NOTE — Telephone Encounter (Signed)
Scheduled per los. Called and left msg. Mailed printout  °

## 2019-09-27 NOTE — Progress Notes (Signed)
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  PRIOR THERAPY: Palliative radiotherapy to the left proximal humerus under the care of Dr. Lisbeth Renshaw.   CURRENT THERAPY: Palliative systemic chemotherapy with carboplatin for AUC of 5 on day 1, etoposide 100 mg/M2 on days 1, 2 and 3 as well as Imfinzi 1500 mg every 3 weeks with the chemotherapy and Neulasta support. First dose on 03/16/2019. Status post 8 cycles.Starting from cycle #5 the patient will be treated with maintenance treatment with Imfinzi 1500 mg IV every 4 weeks.  INTERVAL HISTORY: Brittany Marks 78 y.o. female returns to the clinic for a follow up visit. The patient is feeling well today without any concerning complaints except for a few scattered red dry lesions on her hands and forearm bilaterally. The lesions are non-painful and non-pruritic. The patient continues to tolerate treatment with maintenance immunotherapy with Imfinzi well without any adverse effects besides the skin lesions. Denies any fever, chills, night sweats, or weight loss. Denies any chest pain, cough, or hemoptysis. She reports her baseline shortness of breath with exertion. Denies any nausea, vomiting, diarrhea, or constipation. Denies any headache or visual changes. The patient is here today for evaluation prior to starting cycle # 9  MEDICAL HISTORY: Past Medical History:  Diagnosis Date  . Cataract    Bilateral  . Constipation    pain medication  . Left rotator cuff tear   . Right rotator cuff tear   . SCL CA dx'd 11/2018    ALLERGIES:  is allergic to diclofenac.  MEDICATIONS:  Current Outpatient Medications   Medication Sig Dispense Refill  . b complex vitamins tablet Take 1 tablet by mouth daily.    . COD LIVER OIL PO Take 1 tablet by mouth daily.     Marland Kitchen lidocaine-prilocaine (EMLA) cream Apply 1 application topically as needed. 30 g 0  . Multiple Vitamin (MULTIVITAMIN) tablet Take 1 tablet by mouth daily.    . nicotine (NICODERM CQ) 21 mg/24hr patch Place 1 patch (21 mg total) onto the skin daily. 28 patch 0  . oxymetazoline (AFRIN) 0.05 % nasal spray Place 2 sprays into the nose 2 (two) times daily as needed for congestion.    Vladimir Faster Glycol-Propyl Glycol (SYSTANE) 0.4-0.3 % SOLN Apply 1 drop to eye 2 (two) times daily as needed (dry eyes).    . sodium chloride (OCEAN) 0.65 % nasal spray Place 1 spray into the nose 2 (two) times daily as needed for congestion.    . traMADol (ULTRAM) 50 MG tablet TAKE 1 TABLET BY MOUTH EVERY 6 TO 8 HOURS AS NEEDED FOR PAIN    . vitamin C (ASCORBIC ACID) 500 MG tablet Take 500 mg by mouth daily.    . vitamin E 400 UNIT capsule Take 400 Units by mouth daily.    . prochlorperazine (COMPAZINE) 10 MG tablet Take 1 tablet (10 mg total) by mouth every 6 (six) hours as needed for nausea or vomiting. (Patient not taking: Reported on 08/03/2019) 30 tablet 0   No current facility-administered medications for this visit.   Facility-Administered Medications Ordered in Other Visits  Medication Dose Route Frequency Provider Last Rate Last Admin  . durvalumab (IMFINZI) 1,500 mg  in sodium chloride 0.9 % 100 mL chemo infusion  1,500 mg Intravenous Once Curt Bears, MD 130 mL/hr at 09/28/19 1208 1,500 mg at 09/28/19 1208  . heparin lock flush 100 unit/mL  500 Units Intracatheter Once PRN Magrinat, Virgie Dad, MD      . heparin lock flush 100 unit/mL  500 Units Intracatheter Once PRN Curt Bears, MD      . sodium chloride flush (NS) 0.9 % injection 10 mL  10 mL Intracatheter PRN Magrinat, Virgie Dad, MD      . sodium chloride flush (NS) 0.9 % injection 10 mL  10 mL  Intracatheter PRN Curt Bears, MD        SURGICAL HISTORY:  Past Surgical History:  Procedure Laterality Date  . CATARACT EXTRACTION W/ INTRAOCULAR LENS  IMPLANT, BILATERAL  2012  . DECOMPRESSIVE LUMBAR LAMINECTOMY LEVEL 2 N/A 07/29/2013   Procedure: LUMBAR LAMINECTOMY, DECOMPRESSION LUMBAR THREE TO FOUR, FOUR TO FIVE microdiscectomy l3,4 right;  Surgeon: Tobi Bastos, MD;  Location: [facility];  Service: Orthopedics;  Laterality: N/A;  . I & D SUPERIOR RIGHT SHOULDER AND CLOSURE WOUND  01-10-2011   S/P ROTATOR CUFF REPAIR  . IR IMAGING GUIDED PORT INSERTION  04/09/2019  . LUMBAR LAMINECTOMY  1970'S  . LUMBAR LAMINECTOMY/DECOMPRESSION MICRODISCECTOMY N/A 06/27/2016   Procedure: L1 - L2 DISCECTOMY;  Surgeon: Melina Schools, MD;  Location: [place];  Service: Orthopedics;  Laterality: N/A;  . [place]  . RIGHT SHOULDER ARTHROSCOPY/ OPEN DISTAL CLAVICLE RESECTION/ SAD/ OPEN ROTATOR CUFF REPAIR  11-28-2010  . SHOULDER OPEN ROTATOR CUFF REPAIR  12/19/2011   Procedure: ROTATOR CUFF REPAIR SHOULDER OPEN;  Surgeon: Magnus Sinning, MD;  Location: [place];  Service: Orthopedics;  Laterality: Right;  RIGHT RECURRENT OPEN REPAIR OF THE ROTATOR CUFF WITH TISSUE MEND GRAFTANTERIOR CHROMIOECTOMY  . VAGINAL HYSTERECTOMY  1979    REVIEW OF SYSTEMS:   Review of Systems  Constitutional: Negative for appetite change, chills, fatigue, fever and unexpected weight change.  HENT:  Negative for mouth sores, nosebleeds, sore throat and trouble swallowing.   Eyes: Negative for eye problems and icterus.  Respiratory: Positive for baseline shortness of breath with exertion. Negative for cough, hemoptysis, and wheezing.   Cardiovascular: Negative for chest pain and leg swelling.  Gastrointestinal: Negative for abdominal pain, constipation, diarrhea, nausea and vomiting.  Genitourinary: Negative for bladder incontinence, difficulty urinating, dysuria, frequency and hematuria.    Musculoskeletal: Negative for back pain, gait problem, neck pain and neck stiffness.  Skin: Positive for scattered small dry skin lesions on hands/forearms bilaterally.  Neurological: Negative for dizziness, extremity weakness, gait problem, headaches, light-headedness and seizures.  Hematological: Negative for adenopathy. Does not bruise/bleed easily.  Psychiatric/Behavioral: Negative for confusion, depression and sleep disturbance. The patient is not nervous/anxious.     PHYSICAL EXAMINATION:  Blood pressure (!) 152/71, pulse 71, temperature 98.2 F (36.8 C), temperature source Temporal, resp. rate 17, height 5\' 2"  (1.575 m), weight 129 lb (58.5 kg), SpO2 97 %.  ECOG PERFORMANCE STATUS: 1 - Symptomatic but completely ambulatory  Physical Exam  Constitutional: Oriented to person, place, and time and well-developed, well-nourished, and in no distress.  HENT:  Head: Normocephalic and atraumatic.  Mouth/Throat: Oropharynx is clear and moist. No oropharyngeal exudate.  Eyes: Conjunctivae are normal. Right eye exhibits no discharge. Left eye exhibits no discharge. No scleral icterus.  Neck: Normal range of motion. Neck supple.  Cardiovascular: Normal rate, regular rhythm, normal heart sounds and intact  distal pulses.   Pulmonary/Chest: Effort normal and breath sounds normal. No respiratory distress. No wheezes. No rales.  Abdominal: Soft. Bowel sounds are normal. Exhibits no distension and no mass. There is no tenderness.  Musculoskeletal: Normal range of motion. Exhibits no edema.  Lymphadenopathy:    No cervical adenopathy.  Neurological: Alert and oriented to person, place, and time. Exhibits normal muscle tone. Gait normal. Coordination normal.  Skin: Skin is warm and dry. Few scattered dry red lesions on dorsal aspect of hand and forearms bilaterally. Not diaphoretic. No pallor.  Psychiatric: Mood, memory and judgment normal.  Vitals reviewed.  LABORATORY DATA: Lab Results   Component Value Date   WBC 7.2 09/28/2019   HGB 12.1 09/28/2019   HCT 36.1 09/28/2019   MCV 92.3 09/28/2019   PLT 189 09/28/2019      Chemistry      Component Value Date/Time   NA 136 09/28/2019 1052   K 4.0 09/28/2019 1052   CL 102 09/28/2019 1052   CO2 24 09/28/2019 1052   BUN 20 09/28/2019 1052   CREATININE 0.71 09/28/2019 1052   CREATININE 0.73 06/01/2019 1305      Component Value Date/Time   CALCIUM 9.1 09/28/2019 1052   ALKPHOS 59 09/28/2019 1052   AST 21 09/28/2019 1052   AST 22 06/01/2019 1305   ALT 14 09/28/2019 1052   ALT 15 06/01/2019 1305   BILITOT 0.5 09/28/2019 1052   BILITOT 0.3 06/01/2019 1305       RADIOGRAPHIC STUDIES:  No results found.   ASSESSMENT/PLAN:  This is a very pleasant 78 year old Caucasian female recently diagnosed with extensive stage (T1b, and 3, N1 C) small cell lung cancer. She presented with right upper lobe pulmonary nodules in addition to bilateral hilar and right mediastinal as well as left supraclavicular and abdominal lymphadenopathy. She also has metastatic disease to the in the left humerus. She was diagnosed in May 2020.  She is status post palliative radiotherapy to the left proximal humerus under the care of Dr. Lisbeth Renshaw.   She is currently undergoing palliative systemic chemotherapy with carboplatin for AUC of 5 on day 1, etoposide 100 mg/M2 on days 1, 2 and 3 as well as Imfinzi 1500 mg every 3 weeks with the chemotherapy and Neulasta support. Status post 8 cycles.Starting from cycle #5 the patient has been treated with maintenance treatment with Imfinzi 1500 mg IV every 4 weeks.   Labs were reviewed.  Recommend that she proceed with cycle #9 today scheduled.  I will arrange for the patient to have a restaging CT scan performed prior to her next visit.  We will see the patient back for follow-up visit in 4 weeks for evaluation and to review her restaging CT scan before starting cycle #10.  For her skin lesions, these  are mild and the patient is asymptomatic and they do not bother her. I discussed with the patient that if they are pruritic, that she may use hydrocortisone cream. She was instructed to call us if her rash worsens. Likely related to her treatment with immunotherapy.   The patient's blood pressure was elevated today. She is not on any anti-hypertensives at this time. She states that she has a blood pressure cuff at home that she does not use. I instructed her to monitor her blood pressure a few times a week and keep a log of her readings. If her blood pressure continues to be high, she was told to follow up with her family doctor regarding starting  her on anti-hypertensives.   The patient was advised to call immediately if she has any concerning symptoms in the interval. The patient voices understanding of current disease status and treatment options and is in agreement with the current care plan. All questions were answered. The patient knows to call the clinic with any problems, questions or concerns. We can certainly see the patient much sooner if necessary  Orders Placed This Encounter  Procedures  . CT Abdomen Pelvis W Contrast    Standing Status:   Future    Standing Expiration Date:   09/27/2020    Order Specific Question:   ** REASON FOR EXAM (FREE TEXT)    Answer:   Restaging Lung Cancer    Order Specific Question:   If indicated for the ordered procedure, I authorize the administration of contrast media per Radiology protocol    Answer:   Yes    Order Specific Question:   Preferred imaging location?    Answer:   [facility]    Order Specific Question:   Is Oral Contrast requested for this exam?    Answer:   Yes, Per Radiology protocol    Order Specific Question:   Radiology Contrast Protocol - do NOT remove file path    Answer:   \\[email or web address]  . CT Chest W Contrast    Standing Status:   Future    Standing Expiration Date:   09/27/2020     Order Specific Question:   ** REASON FOR EXAM (FREE TEXT)    Answer:   Restaging Lung Cancer    Order Specific Question:   If indicated for the ordered procedure, I authorize the administration of contrast media per Radiology protocol    Answer:   Yes    Order Specific Question:   Preferred imaging location?    Answer:   [facility]    Order Specific Question:   Radiology Contrast Protocol - do NOT remove file path    Answer:   [email or web address]     [place], PA-C 09/28/19

## 2019-09-28 ENCOUNTER — Inpatient Hospital Stay: Payer: Federal, State, Local not specified - PPO

## 2019-09-28 ENCOUNTER — Inpatient Hospital Stay: Payer: Federal, State, Local not specified - PPO | Attending: Oncology | Admitting: Physician Assistant

## 2019-09-28 ENCOUNTER — Other Ambulatory Visit: Payer: Federal, State, Local not specified - PPO

## 2019-09-28 ENCOUNTER — Ambulatory Visit: Payer: Federal, State, Local not specified - PPO

## 2019-09-28 ENCOUNTER — Ambulatory Visit: Payer: Federal, State, Local not specified - PPO | Admitting: Internal Medicine

## 2019-09-28 ENCOUNTER — Other Ambulatory Visit: Payer: Self-pay

## 2019-09-28 VITALS — BP 152/71 | HR 71 | Temp 98.2°F | Resp 17 | Ht 62.0 in | Wt 129.0 lb

## 2019-09-28 DIAGNOSIS — C3411 Malignant neoplasm of upper lobe, right bronchus or lung: Secondary | ICD-10-CM

## 2019-09-28 DIAGNOSIS — K5900 Constipation, unspecified: Secondary | ICD-10-CM | POA: Diagnosis not present

## 2019-09-28 DIAGNOSIS — Z5112 Encounter for antineoplastic immunotherapy: Secondary | ICD-10-CM

## 2019-09-28 DIAGNOSIS — R937 Abnormal findings on diagnostic imaging of other parts of musculoskeletal system: Secondary | ICD-10-CM

## 2019-09-28 DIAGNOSIS — Z79899 Other long term (current) drug therapy: Secondary | ICD-10-CM | POA: Diagnosis not present

## 2019-09-28 DIAGNOSIS — R030 Elevated blood-pressure reading, without diagnosis of hypertension: Secondary | ICD-10-CM | POA: Insufficient documentation

## 2019-09-28 DIAGNOSIS — Z1211 Encounter for screening for malignant neoplasm of colon: Secondary | ICD-10-CM

## 2019-09-28 DIAGNOSIS — Z510 Encounter for antineoplastic radiation therapy: Secondary | ICD-10-CM | POA: Insufficient documentation

## 2019-09-28 DIAGNOSIS — C7951 Secondary malignant neoplasm of bone: Secondary | ICD-10-CM | POA: Insufficient documentation

## 2019-09-28 DIAGNOSIS — Z95828 Presence of other vascular implants and grafts: Secondary | ICD-10-CM

## 2019-09-28 LAB — CBC WITH DIFFERENTIAL/PLATELET
Abs Immature Granulocytes: 0.01 10*3/uL (ref 0.00–0.07)
Basophils Absolute: 0 10*3/uL (ref 0.0–0.1)
Basophils Relative: 0 %
Eosinophils Absolute: 0.1 10*3/uL (ref 0.0–0.5)
Eosinophils Relative: 2 %
HCT: 36.1 % (ref 36.0–46.0)
Hemoglobin: 12.1 g/dL (ref 12.0–15.0)
Immature Granulocytes: 0 %
Lymphocytes Relative: 26 %
Lymphs Abs: 1.8 10*3/uL (ref 0.7–4.0)
MCH: 30.9 pg (ref 26.0–34.0)
MCHC: 33.5 g/dL (ref 30.0–36.0)
MCV: 92.3 fL (ref 80.0–100.0)
Monocytes Absolute: 0.7 10*3/uL (ref 0.1–1.0)
Monocytes Relative: 10 %
Neutro Abs: 4.5 10*3/uL (ref 1.7–7.7)
Neutrophils Relative %: 62 %
Platelets: 189 10*3/uL (ref 150–400)
RBC: 3.91 MIL/uL (ref 3.87–5.11)
RDW: 14.3 % (ref 11.5–15.5)
WBC: 7.2 10*3/uL (ref 4.0–10.5)
nRBC: 0 % (ref 0.0–0.2)

## 2019-09-28 LAB — COMPREHENSIVE METABOLIC PANEL
ALT: 14 U/L (ref 0–44)
AST: 21 U/L (ref 15–41)
Albumin: 3.8 g/dL (ref 3.5–5.0)
Alkaline Phosphatase: 59 U/L (ref 38–126)
Anion gap: 10 (ref 5–15)
BUN: 20 mg/dL (ref 8–23)
CO2: 24 mmol/L (ref 22–32)
Calcium: 9.1 mg/dL (ref 8.9–10.3)
Chloride: 102 mmol/L (ref 98–111)
Creatinine, Ser: 0.71 mg/dL (ref 0.44–1.00)
GFR calc Af Amer: >60 mL/min (ref >60)
GFR calc non Af Amer: >60 mL/min (ref >60)
Glucose, Bld: 108 mg/dL — ABNORMAL HIGH (ref 70–99)
Potassium: 4 mmol/L (ref 3.5–5.1)
Sodium: 136 mmol/L (ref 135–145)
Total Bilirubin: 0.5 mg/dL (ref 0.3–1.2)
Total Protein: 6.8 g/dL (ref 6.5–8.1)

## 2019-09-28 LAB — TSH: TSH: 1.686 u[IU]/mL (ref 0.308–3.960)

## 2019-09-28 MED ORDER — SODIUM CHLORIDE 0.9 % IV SOLN
1500.0000 mg | Freq: Once | INTRAVENOUS | Status: AC
  Administered 2019-09-28: 1500 mg via INTRAVENOUS
  Filled 2019-09-28: qty 30

## 2019-09-28 MED ORDER — SODIUM CHLORIDE 0.9% FLUSH
10.0000 mL | INTRAVENOUS | Status: DC | PRN
  Administered 2019-09-28: 10 mL
  Filled 2019-09-28: qty 10

## 2019-09-28 MED ORDER — SODIUM CHLORIDE 0.9 % IV SOLN
Freq: Once | INTRAVENOUS | Status: AC
  Filled 2019-09-28: qty 250

## 2019-09-28 MED ORDER — HEPARIN SOD (PORK) LOCK FLUSH 100 UNIT/ML IV SOLN
500.0000 [IU] | Freq: Once | INTRAVENOUS | Status: AC | PRN
  Administered 2019-09-28: 500 [IU]
  Filled 2019-09-28: qty 5

## 2019-09-29 ENCOUNTER — Telehealth: Payer: Self-pay | Admitting: Internal Medicine

## 2019-09-29 NOTE — Telephone Encounter (Signed)
Moved lab and port flush to 1/14 per 12/21 los for CT scans

## 2019-10-22 ENCOUNTER — Inpatient Hospital Stay: Payer: Federal, State, Local not specified - PPO

## 2019-10-22 ENCOUNTER — Other Ambulatory Visit: Payer: Self-pay

## 2019-10-22 ENCOUNTER — Inpatient Hospital Stay: Payer: Federal, State, Local not specified - PPO | Attending: Oncology

## 2019-10-22 DIAGNOSIS — R21 Rash and other nonspecific skin eruption: Secondary | ICD-10-CM | POA: Insufficient documentation

## 2019-10-22 DIAGNOSIS — C7951 Secondary malignant neoplasm of bone: Secondary | ICD-10-CM | POA: Diagnosis not present

## 2019-10-22 DIAGNOSIS — J439 Emphysema, unspecified: Secondary | ICD-10-CM | POA: Insufficient documentation

## 2019-10-22 DIAGNOSIS — I2510 Atherosclerotic heart disease of native coronary artery without angina pectoris: Secondary | ICD-10-CM | POA: Diagnosis not present

## 2019-10-22 DIAGNOSIS — C3411 Malignant neoplasm of upper lobe, right bronchus or lung: Secondary | ICD-10-CM | POA: Insufficient documentation

## 2019-10-22 DIAGNOSIS — C778 Secondary and unspecified malignant neoplasm of lymph nodes of multiple regions: Secondary | ICD-10-CM | POA: Diagnosis not present

## 2019-10-22 DIAGNOSIS — Z5112 Encounter for antineoplastic immunotherapy: Secondary | ICD-10-CM | POA: Diagnosis not present

## 2019-10-22 DIAGNOSIS — I700 Atherosclerosis of aorta: Secondary | ICD-10-CM | POA: Diagnosis not present

## 2019-10-22 DIAGNOSIS — Z923 Personal history of irradiation: Secondary | ICD-10-CM | POA: Insufficient documentation

## 2019-10-22 DIAGNOSIS — Z95828 Presence of other vascular implants and grafts: Secondary | ICD-10-CM

## 2019-10-22 DIAGNOSIS — M19011 Primary osteoarthritis, right shoulder: Secondary | ICD-10-CM | POA: Diagnosis not present

## 2019-10-22 DIAGNOSIS — M19012 Primary osteoarthritis, left shoulder: Secondary | ICD-10-CM | POA: Diagnosis not present

## 2019-10-22 DIAGNOSIS — K5900 Constipation, unspecified: Secondary | ICD-10-CM | POA: Insufficient documentation

## 2019-10-22 DIAGNOSIS — R937 Abnormal findings on diagnostic imaging of other parts of musculoskeletal system: Secondary | ICD-10-CM

## 2019-10-22 DIAGNOSIS — Z1211 Encounter for screening for malignant neoplasm of colon: Secondary | ICD-10-CM

## 2019-10-22 LAB — CBC WITH DIFFERENTIAL/PLATELET
Abs Immature Granulocytes: 0.04 10*3/uL (ref 0.00–0.07)
Basophils Absolute: 0 10*3/uL (ref 0.0–0.1)
Basophils Relative: 0 %
Eosinophils Absolute: 0.1 10*3/uL (ref 0.0–0.5)
Eosinophils Relative: 1 %
HCT: 35.9 % — ABNORMAL LOW (ref 36.0–46.0)
Hemoglobin: 12.1 g/dL (ref 12.0–15.0)
Immature Granulocytes: 1 %
Lymphocytes Relative: 20 %
Lymphs Abs: 1.7 10*3/uL (ref 0.7–4.0)
MCH: 31.6 pg (ref 26.0–34.0)
MCHC: 33.7 g/dL (ref 30.0–36.0)
MCV: 93.7 fL (ref 80.0–100.0)
Monocytes Absolute: 0.7 10*3/uL (ref 0.1–1.0)
Monocytes Relative: 9 %
Neutro Abs: 5.8 10*3/uL (ref 1.7–7.7)
Neutrophils Relative %: 69 %
Platelets: 205 10*3/uL (ref 150–400)
RBC: 3.83 MIL/uL — ABNORMAL LOW (ref 3.87–5.11)
RDW: 14.8 % (ref 11.5–15.5)
WBC: 8.4 10*3/uL (ref 4.0–10.5)
nRBC: 0 % (ref 0.0–0.2)

## 2019-10-22 LAB — COMPREHENSIVE METABOLIC PANEL
ALT: 14 U/L (ref 0–44)
AST: 21 U/L (ref 15–41)
Albumin: 3.9 g/dL (ref 3.5–5.0)
Alkaline Phosphatase: 70 U/L (ref 38–126)
Anion gap: 9 (ref 5–15)
BUN: 15 mg/dL (ref 8–23)
CO2: 25 mmol/L (ref 22–32)
Calcium: 9.1 mg/dL (ref 8.9–10.3)
Chloride: 104 mmol/L (ref 98–111)
Creatinine, Ser: 0.73 mg/dL (ref 0.44–1.00)
GFR calc Af Amer: >60 mL/min (ref >60)
GFR calc non Af Amer: >60 mL/min (ref >60)
Glucose, Bld: 113 mg/dL — ABNORMAL HIGH (ref 70–99)
Potassium: 4.1 mmol/L (ref 3.5–5.1)
Sodium: 138 mmol/L (ref 135–145)
Total Bilirubin: 0.5 mg/dL (ref 0.3–1.2)
Total Protein: 6.9 g/dL (ref 6.5–8.1)

## 2019-10-22 LAB — TSH: TSH: 2.297 u[IU]/mL (ref 0.308–3.960)

## 2019-10-22 MED ORDER — SODIUM CHLORIDE 0.9% FLUSH
10.0000 mL | INTRAVENOUS | Status: DC | PRN
  Administered 2019-10-22: 10 mL
  Filled 2019-10-22: qty 10

## 2019-10-22 MED ORDER — HEPARIN SOD (PORK) LOCK FLUSH 100 UNIT/ML IV SOLN
500.0000 [IU] | Freq: Once | INTRAVENOUS | Status: AC | PRN
  Administered 2019-10-22: 500 [IU]
  Filled 2019-10-22: qty 5

## 2019-10-23 ENCOUNTER — Ambulatory Visit (HOSPITAL_COMMUNITY)
Admission: RE | Admit: 2019-10-23 | Discharge: 2019-10-23 | Disposition: A | Discharge status: Home / Self Care | Payer: Federal, State, Local not specified - PPO | Source: Ambulatory Visit | Attending: Physician Assistant | Admitting: Physician Assistant

## 2019-10-23 ENCOUNTER — Encounter (HOSPITAL_COMMUNITY): Payer: Self-pay

## 2019-10-23 DIAGNOSIS — C3411 Malignant neoplasm of upper lobe, right bronchus or lung: Secondary | ICD-10-CM

## 2019-10-23 MED ORDER — IOHEXOL 300 MG/ML  SOLN
100.0000 mL | Freq: Once | INTRAMUSCULAR | Status: AC | PRN
  Administered 2019-10-23: 100 mL via INTRAVENOUS

## 2019-10-23 MED ORDER — HEPARIN SOD (PORK) LOCK FLUSH 100 UNIT/ML IV SOLN
INTRAVENOUS | Status: AC
  Administered 2019-10-23: 500 [IU] via INTRAVENOUS
  Filled 2019-10-23: qty 5

## 2019-10-23 MED ORDER — HEPARIN SOD (PORK) LOCK FLUSH 100 UNIT/ML IV SOLN: 500.0000 [IU] | Freq: Once | INTRAVENOUS | Status: AC

## 2019-10-23 MED ORDER — SODIUM CHLORIDE (PF) 0.9 % IJ SOLN
INTRAMUSCULAR | Status: AC
  Filled 2019-10-23: qty 50

## 2019-10-25 NOTE — Progress Notes (Signed)
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  PRIOR THERAPY: Palliative radiotherapy to the left proximal humerus under the care of Dr. Lisbeth Renshaw.   CURRENT THERAPY: Palliative systemic chemotherapy with carboplatin for AUC of 5 on day 1, etoposide 100 mg/M2 on days 1, 2 and 3 as well as Imfinzi 1500 mg every 3 weeks with the chemotherapy and Neulasta support. First dose on 03/16/2019. Status post 9 cycles.Starting from cycle #5 the patient will be treated with maintenance treatment with Imfinzi 1500 mg IV every 4 weeks.  INTERVAL HISTORY: Brittany Marks 79 y.o. female returns to the clinic for a follow up visit. The patient is feeling well today without any concerning complaints except for a few scattered red dry lesions on her hands, forearms, and anterior chest. The lesions are non-painful and non-pruritic. The patient continues to tolerate treatment with maintenance immunotherapy with Imfinzi well without any adverse effects besides the skin lesions. Denies any fever, chills, night sweats, or weight loss. Denies any chest pain, cough, or hemoptysis. She reports her baseline shortness of breath with exertion. Denies any nausea, vomiting, diarrhea, or constipation. Denies any headache or visual changes. She recently had a restaging CT scan performed. The patient is here today for evaluation and to review the results of her CT scan prior to starting cycle # 10.    MEDICAL HISTORY: Past Medical History:  Diagnosis Date  . Cataract    Bilateral  . Constipation    pain medication  . Left rotator cuff tear   . Right rotator cuff tear   . SCL CA dx'd 11/2018     ALLERGIES:  is allergic to diclofenac.  MEDICATIONS:  Current Outpatient Medications  Medication Sig Dispense Refill  . b complex vitamins tablet Take 1 tablet by mouth daily.    . COD LIVER OIL PO Take 1 tablet by mouth daily.     Marland Kitchen lidocaine-prilocaine (EMLA) cream Apply 1 application topically as needed. 30 g 0  . Multiple Vitamin (MULTIVITAMIN) tablet Take 1 tablet by mouth daily.    . nicotine (NICODERM CQ) 21 mg/24hr patch Place 1 patch (21 mg total) onto the skin daily. 28 patch 0  . oxymetazoline (AFRIN) 0.05 % nasal spray Place 2 sprays into the nose 2 (two) times daily as needed for congestion.    Vladimir Faster Glycol-Propyl Glycol (SYSTANE) 0.4-0.3 % SOLN Apply 1 drop to eye 2 (two) times daily as needed (dry eyes).    . sodium chloride (OCEAN) 0.65 % nasal spray Place 1 spray into the nose 2 (two) times daily as needed for congestion.    . traMADol (ULTRAM) 50 MG tablet TAKE 1 TABLET BY MOUTH EVERY 6 TO 8 HOURS AS NEEDED FOR PAIN    . vitamin C (ASCORBIC ACID) 500 MG tablet Take 500 mg by mouth daily.    . vitamin E 400 UNIT capsule Take 400 Units by mouth daily.    . prochlorperazine (COMPAZINE) 10 MG tablet Take 1 tablet (10 mg total) by mouth every 6 (six) hours as needed for nausea or vomiting. (Patient not taking: Reported on 08/03/2019) 30 tablet 0   No current facility-administered medications for this visit.   Facility-Administered Medications  Ordered in Other Visits  Medication Dose Route Frequency Provider Last Rate Last Admin  . heparin lock flush 100 unit/mL  500 Units Intracatheter Once PRN Magrinat, Virgie Dad, MD      . sodium chloride flush (NS) 0.9 % injection 10 mL  10 mL Intracatheter PRN Magrinat, Virgie Dad, MD        SURGICAL HISTORY:  Past Surgical History:  Procedure Laterality Date  . CATARACT EXTRACTION W/ INTRAOCULAR LENS  IMPLANT, BILATERAL  2012  . DECOMPRESSIVE LUMBAR LAMINECTOMY LEVEL 2 N/A 07/29/2013   Procedure: LUMBAR LAMINECTOMY,  DECOMPRESSION LUMBAR THREE TO FOUR, FOUR TO FIVE microdiscectomy l3,4 right;  Surgeon: Tobi Bastos, MD;  Location: [facility];  Service: Orthopedics;  Laterality: N/A;  . I & D SUPERIOR RIGHT SHOULDER AND CLOSURE WOUND  01-10-2011   S/P ROTATOR CUFF REPAIR  . IR IMAGING GUIDED PORT INSERTION  04/09/2019  . LUMBAR LAMINECTOMY  1970'S  . LUMBAR LAMINECTOMY/DECOMPRESSION MICRODISCECTOMY N/A 06/27/2016   Procedure: L1 - L2 DISCECTOMY;  Surgeon: Melina Schools, MD;  Location: [facility];  Service: Orthopedics;  Laterality: N/A;  . [place]  . RIGHT SHOULDER ARTHROSCOPY/ OPEN DISTAL CLAVICLE RESECTION/ SAD/ OPEN ROTATOR CUFF REPAIR  11-28-2010  . SHOULDER OPEN ROTATOR CUFF REPAIR  12/19/2011   Procedure: ROTATOR CUFF REPAIR SHOULDER OPEN;  Surgeon: Magnus Sinning, MD;  Location: [facility];  Service: Orthopedics;  Laterality: Right;  RIGHT RECURRENT OPEN REPAIR OF THE ROTATOR CUFF WITH TISSUE MEND GRAFTANTERIOR CHROMIOECTOMY  . VAGINAL HYSTERECTOMY  1979    REVIEW OF SYSTEMS:   Review of Systems  Constitutional: Negative for appetite change, chills, fatigue, fever and unexpected weight change.  HENT: Negative for mouth sores, nosebleeds, sore throat and trouble swallowing.   Eyes: Negative for eye problems and icterus.  Respiratory: Positive for baseline shortness of breath with exertion. Negative for cough, hemoptysis, and wheezing.   Cardiovascular: Negative for chest pain and leg swelling.  Gastrointestinal: Negative for abdominal pain, constipation, diarrhea, nausea and vomiting.  Genitourinary: Negative for bladder incontinence, difficulty urinating, dysuria, frequency and hematuria.   Musculoskeletal: Negative for back pain, gait problem, neck pain and neck stiffness.  Skin: Positive for scattered small dry skin lesions on hands/forearms bilaterally.  Neurological: Negative for dizziness, extremity weakness, gait problem, headaches, light-headedness and seizures.   Hematological: Negative for adenopathy. Does not bruise/bleed easily.  Psychiatric/Behavioral: Negative for confusion, depression and sleep disturbance. The patient is not nervous/anxious.       PHYSICAL EXAMINATION:  Blood pressure 114/73, pulse 77, temperature 98.2 F (36.8 C), temperature source Temporal, resp. rate 18, height 5\' 2"  (1.575 m), weight 128 lb 6.4 oz (58.2 kg), SpO2 98 %.  ECOG PERFORMANCE STATUS: 1 - Symptomatic but completely ambulatory  Physical Exam  Constitutional: Oriented to person, place, and time and well-developed, well-nourished, and in no distress.  HENT:  Head: Normocephalic and atraumatic.  Mouth/Throat: Oropharynx is clear and moist. No oropharyngeal exudate.  Eyes: Conjunctivae are normal. Right eye exhibits no discharge. Left eye exhibits no discharge. No scleral icterus.  Neck: Normal range of motion. Neck supple.  Cardiovascular: Normal rate, regular rhythm, normal heart sounds and intact distal pulses.   Pulmonary/Chest: Effort normal and breath sounds normal. No respiratory distress. No wheezes. No rales.  Abdominal: Soft. Bowel sounds are normal. Exhibits no distension and no mass. There is no tenderness.  Musculoskeletal: Normal range of motion. Exhibits no edema.  Lymphadenopathy:    No cervical adenopathy.  Neurological:  Alert and oriented to person, place, and time. Exhibits normal muscle tone. Gait normal. Coordination normal.  Skin: Skin is warm and dry. scattered dry red lesions on dorsal aspect of hand and forearms bilaterally. Not diaphoretic. No erythema. No pallor.  Psychiatric: Mood, memory and judgment normal.  Vitals reviewed.  LABORATORY DATA: Lab Results  Component Value Date   WBC 8.4 10/22/2019   HGB 12.1 10/22/2019   HCT 35.9 (L) 10/22/2019   MCV 93.7 10/22/2019   PLT 205 10/22/2019      Chemistry      Component Value Date/Time   NA 138 10/22/2019 1045   K 4.1 10/22/2019 1045   CL 104 10/22/2019 1045   CO2 25  10/22/2019 1045   BUN 15 10/22/2019 1045   CREATININE 0.73 10/22/2019 1045   CREATININE 0.73 06/01/2019 1305      Component Value Date/Time   CALCIUM 9.1 10/22/2019 1045   ALKPHOS 70 10/22/2019 1045   AST 21 10/22/2019 1045   AST 22 06/01/2019 1305   ALT 14 10/22/2019 1045   ALT 15 06/01/2019 1305   BILITOT 0.5 10/22/2019 1045   BILITOT 0.3 06/01/2019 1305       RADIOGRAPHIC STUDIES:  CT Chest W Contrast  Result Date: 10/23/2019 CLINICAL DATA:  Restaging Lung cancer. EXAM: CT CHEST, ABDOMEN, AND PELVIS WITH CONTRAST TECHNIQUE: Multidetector CT imaging of the chest, abdomen and pelvis was performed following the standard protocol during bolus administration of intravenous contrast. CONTRAST:  155mL OMNIPAQUE IOHEXOL 300 MG/ML  SOLN COMPARISON:  07/28/2019 FINDINGS: CT CHEST FINDINGS Cardiovascular: The heart size appears within normal limits. No pericardial effusion. Aortic atherosclerosis. Left main, lad, left circumflex and RCA coronary artery calcifications. Mediastinum/Nodes: Normal appearance of the thyroid gland. The trachea appears patent and is midline. Normal appearance of the esophagus. No adenopathy. Lungs/Pleura: No pleural effusion. Emphysema. Changes of interstitial lung disease are again noted including basilar predominant peripheral interstitial reticulation, subpleural banding and traction bronchiectasis. No airspace consolidation, atelectasis or pneumothorax. No suspicious pulmonary parenchymal nodule or mass identified. Musculoskeletal: Left humeral diaphysis seal nonunion. Unchanged. Advanced bilateral shoulder osteoarthritis. Multi level degenerative disc disease identified throughout the spine. CT ABDOMEN PELVIS FINDINGS Hepatobiliary: No suspicious liver abnormality. The spur nonspecific infiltrative soft tissue within the porta hepatic region is again noted measuring 2.7 cm, image 53/2. Previously this measured 3.2 cm. Pancreas: Unremarkable. No pancreatic ductal  dilatation or surrounding inflammatory changes. Spleen: Normal in size without focal abnormality. Adrenals/Urinary Tract: Normal adrenal glands. Left kidney negative. Large right extrarenal pelvis is unchanged. The urinary bladder appears normal. Stomach/Bowel: Stomach is unremarkable. No dilated loops of small or large bowel. The appendix is visualized and appears normal. No bowel wall thickening or inflammation identified. Vascular/Lymphatic: Aortic atherosclerosis. No enlarged abdominal or pelvic lymph nodes. Reproductive: Status post hysterectomy. No adnexal masses. Other: No abdominal wall hernia or abnormality. No abdominopelvic ascites. Musculoskeletal: Degenerative disc disease is identified within the lumbar spine. This is most advanced at L1-2 and L2-3. No acute or suspicious osseous findings identified at this time. IMPRESSION: 1. Unchanged post treatment appearance of the chest, abdomen and pelvis. No persistent or recurrent adenopathy or mass within the chest, abdomen, or pelvis. 2. The ill-defined soft tissue within the porta hepatic region reflecting area of treated tumor continues to decrease in size in the interval. 3. Similar appearance of changes secondary to interstitial lung disease. 4. Left main, 3 vessel coronary artery calcifications noted. Aortic Atherosclerosis (ICD10-I70.0) and Emphysema (ICD10-J43.9). Electronically Signed   By: Lovena Le  Clovis Riley M.D.   On: 10/23/2019 13:48   CT Abdomen Pelvis W Contrast  Result Date: 10/23/2019 CLINICAL DATA:  Restaging Lung cancer. EXAM: CT CHEST, ABDOMEN, AND PELVIS WITH CONTRAST TECHNIQUE: Multidetector CT imaging of the chest, abdomen and pelvis was performed following the standard protocol during bolus administration of intravenous contrast. CONTRAST:  153mL OMNIPAQUE IOHEXOL 300 MG/ML  SOLN COMPARISON:  07/28/2019 FINDINGS: CT CHEST FINDINGS Cardiovascular: The heart size appears within normal limits. No pericardial effusion. Aortic  atherosclerosis. Left main, lad, left circumflex and RCA coronary artery calcifications. Mediastinum/Nodes: Normal appearance of the thyroid gland. The trachea appears patent and is midline. Normal appearance of the esophagus. No adenopathy. Lungs/Pleura: No pleural effusion. Emphysema. Changes of interstitial lung disease are again noted including basilar predominant peripheral interstitial reticulation, subpleural banding and traction bronchiectasis. No airspace consolidation, atelectasis or pneumothorax. No suspicious pulmonary parenchymal nodule or mass identified. Musculoskeletal: Left humeral diaphysis seal nonunion. Unchanged. Advanced bilateral shoulder osteoarthritis. Multi level degenerative disc disease identified throughout the spine. CT ABDOMEN PELVIS FINDINGS Hepatobiliary: No suspicious liver abnormality. The spur nonspecific infiltrative soft tissue within the porta hepatic region is again noted measuring 2.7 cm, image 53/2. Previously this measured 3.2 cm. Pancreas: Unremarkable. No pancreatic ductal dilatation or surrounding inflammatory changes. Spleen: Normal in size without focal abnormality. Adrenals/Urinary Tract: Normal adrenal glands. Left kidney negative. Large right extrarenal pelvis is unchanged. The urinary bladder appears normal. Stomach/Bowel: Stomach is unremarkable. No dilated loops of small or large bowel. The appendix is visualized and appears normal. No bowel wall thickening or inflammation identified. Vascular/Lymphatic: Aortic atherosclerosis. No enlarged abdominal or pelvic lymph nodes. Reproductive: Status post hysterectomy. No adnexal masses. Other: No abdominal wall hernia or abnormality. No abdominopelvic ascites. Musculoskeletal: Degenerative disc disease is identified within the lumbar spine. This is most advanced at L1-2 and L2-3. No acute or suspicious osseous findings identified at this time. IMPRESSION: 1. Unchanged post treatment appearance of the chest, abdomen and  pelvis. No persistent or recurrent adenopathy or mass within the chest, abdomen, or pelvis. 2. The ill-defined soft tissue within the porta hepatic region reflecting area of treated tumor continues to decrease in size in the interval. 3. Similar appearance of changes secondary to interstitial lung disease. 4. Left main, 3 vessel coronary artery calcifications noted. Aortic Atherosclerosis (ICD10-I70.0) and Emphysema (ICD10-J43.9). Electronically Signed   By: Kerby Moors M.D.   On: 10/23/2019 13:48     ASSESSMENT/PLAN:  This is a very pleasant 79 year old Caucasian female recently diagnosed with extensive stage (T1b, and 3, N1 C) small cell lung cancer. She presented with right upper lobe pulmonary nodules in addition to bilateral hilar and right mediastinal as well as left supraclavicular and abdominal lymphadenopathy. She also has metastatic disease to the in the left humerus. She was diagnosed in May 2020.  She is status post palliative radiotherapy to the left proximal humerus under the care of Dr. Lisbeth Renshaw.   She is currently undergoing palliative systemic chemotherapy with carboplatin for AUC of 5 on day 1, etoposide 100 mg/M2 on days 1, 2 and 3 as well as Imfinzi 1500 mg every 3 weeks with the chemotherapy and Neulasta support. Status post 9 cycles.Starting from cycle #5 the patient has been treated with maintenance treatment with Imfinzi 1500 mg IV every 4 weeks.  The patient recently had a restaging CT scan performed. Dr. Julien Nordmann personally and independently reviewed the scan and discussed the results with the patient. The scan did not show any evidence of disease progression. Dr. Julien Nordmann  recommends that the patient continue with cycle #10 today as scheduled.   We will see her back for a follow up visit in 3 weeks for evaluation before starting cycle #11.   Her scan noted 3 vessel coronary artery calcifications. The patient does not have a cardiologist or a primary provider. I have  placed a referral for the patient to see cardiology to manage any risk factors for heart disease and for a PCP to establish care.   For her rash, she reports itching "now and again". Discussed to let us know if her rash increases. For now, she was encouraged to use topical hydrocortisone cream or anti-histamines if needed. Discussed that anti-histamines may make her drowsy so be consciousness when she takes it.    The patient was advised to call immediately if she has any concerning symptoms in the interval. The patient voices understanding of current disease status and treatment options and is in agreement with the current care plan. All questions were answered. The patient knows to call the clinic with any problems, questions or concerns. We can certainly see the patient much sooner if necessary    Orders Placed This Encounter  Procedures  . Ambulatory referral to Cardiology    Referral Priority:   Routine    Referral Type:   Consultation    Referral Reason:   Specialty Services Required    Requested Specialty:   Cardiology    Number of Visits Requested:   1  . Ambulatory referral to Internal Medicine    Referral Priority:   Routine    Referral Type:   Consultation    Referral Reason:   Specialty Services Required    Requested Specialty:   Internal Medicine    Number of Visits Requested:   [place], PA-C 10/26/19  ADDENDUM: Hematology/Oncology Attending: I had a face-to-face encounter with the patient today.  I recommended her care plan.  This is a very pleasant 79 years old white female with extensive stage small cell lung cancer status post induction systemic chemotherapy with carboplatin, etoposide and Imfinzi and she is currently on maintenance treatment with immunotherapy with Imfinzi every 4 weeks is status post 9 cycles.  The patient has been tolerating this treatment well with no concerning complaints.  She continues to have weakness in the lower  extremities secondary to arthritis. The patient had repeat CT scan of the chest, abdomen pelvis performed recently.  I personally and independently reviewed the scans and discussed the results with the patient today. Her imaging studies showed no evidence for disease progression. I recommended for the patient to continue her current treatment with Imfinzi every 4 weeks. For the coronary atherosclerosis, will refer the patient to cardiology for evaluation of her condition.  We will also refer the patient to internal medicine to establish care with a primary care physician. The patient will come back for follow-up visit in 4 weeks for evaluation before starting cycle #11. She was advised to call immediately if she has any concerning symptoms in the interval.  Disclaimer: This note was dictated with voice recognition software. Similar sounding words can inadvertently be transcribed and may be missed upon review. Eilleen Kempf, MD 10/26/19

## 2019-10-26 ENCOUNTER — Inpatient Hospital Stay (HOSPITAL_BASED_OUTPATIENT_CLINIC_OR_DEPARTMENT_OTHER): Payer: Federal, State, Local not specified - PPO | Admitting: Physician Assistant

## 2019-10-26 ENCOUNTER — Other Ambulatory Visit: Payer: Federal, State, Local not specified - PPO

## 2019-10-26 ENCOUNTER — Inpatient Hospital Stay: Payer: Federal, State, Local not specified - PPO

## 2019-10-26 ENCOUNTER — Encounter: Payer: Self-pay | Admitting: Physician Assistant

## 2019-10-26 ENCOUNTER — Other Ambulatory Visit: Payer: Self-pay

## 2019-10-26 VITALS — BP 114/73 | HR 77 | Temp 98.2°F | Resp 18 | Ht 62.0 in | Wt 128.4 lb

## 2019-10-26 DIAGNOSIS — C7952 Secondary malignant neoplasm of bone marrow: Secondary | ICD-10-CM

## 2019-10-26 DIAGNOSIS — C7951 Secondary malignant neoplasm of bone: Secondary | ICD-10-CM | POA: Diagnosis not present

## 2019-10-26 DIAGNOSIS — C3411 Malignant neoplasm of upper lobe, right bronchus or lung: Secondary | ICD-10-CM | POA: Diagnosis not present

## 2019-10-26 DIAGNOSIS — Z5112 Encounter for antineoplastic immunotherapy: Secondary | ICD-10-CM | POA: Diagnosis not present

## 2019-10-26 MED ORDER — SODIUM CHLORIDE 0.9 % IV SOLN
1500.0000 mg | Freq: Once | INTRAVENOUS | Status: AC
  Administered 2019-10-26: 1500 mg via INTRAVENOUS
  Filled 2019-10-26: qty 30

## 2019-10-26 MED ORDER — HEPARIN SOD (PORK) LOCK FLUSH 100 UNIT/ML IV SOLN
500.0000 [IU] | Freq: Once | INTRAVENOUS | Status: AC | PRN
  Administered 2019-10-26: 500 [IU]
  Filled 2019-10-26: qty 5

## 2019-10-26 MED ORDER — SODIUM CHLORIDE 0.9% FLUSH
10.0000 mL | INTRAVENOUS | Status: DC | PRN
  Administered 2019-10-26: 10 mL
  Filled 2019-10-26: qty 10

## 2019-10-26 MED ORDER — SODIUM CHLORIDE 0.9 % IV SOLN
Freq: Once | INTRAVENOUS | Status: AC
  Filled 2019-10-26: qty 250

## 2019-10-26 NOTE — Patient Instructions (Signed)
[place] Discharge Instructions for Patients Receiving Chemotherapy  Today you received the following chemotherapy agents Imfinzi.  To help prevent nausea and vomiting after your treatment, we encourage you to take your nausea medication as directed. If you develop nausea and vomiting that is not controlled by your nausea medication, call the clinic.   BELOW ARE SYMPTOMS THAT SHOULD BE REPORTED IMMEDIATELY:  *FEVER GREATER THAN 100.5 F  *CHILLS WITH OR WITHOUT FEVER  NAUSEA AND VOMITING THAT IS NOT CONTROLLED WITH YOUR NAUSEA MEDICATION  *UNUSUAL SHORTNESS OF BREATH  *UNUSUAL BRUISING OR BLEEDING  TENDERNESS IN MOUTH AND THROAT WITH OR WITHOUT PRESENCE OF ULCERS  *URINARY PROBLEMS  *BOWEL PROBLEMS  UNUSUAL RASH Items with * indicate a potential emergency and should be followed up as soon as possible.  Feel free to call the clinic you have any questions or concerns. The clinic phone number is [phone].  Please show the [facility] at check-in to the Emergency Department and triage nurse.

## 2019-10-27 ENCOUNTER — Telehealth: Payer: Self-pay | Admitting: Internal Medicine

## 2019-10-27 NOTE — Telephone Encounter (Signed)
Scheduled per los. Called and spoke with patient. Confirmed appt 

## 2019-11-03 ENCOUNTER — Encounter: Payer: Self-pay | Admitting: General Practice

## 2019-11-23 ENCOUNTER — Other Ambulatory Visit: Payer: Self-pay

## 2019-11-23 ENCOUNTER — Inpatient Hospital Stay: Payer: Federal, State, Local not specified - PPO

## 2019-11-23 ENCOUNTER — Encounter: Payer: Self-pay | Admitting: Internal Medicine

## 2019-11-23 ENCOUNTER — Other Ambulatory Visit: Payer: Self-pay | Admitting: Internal Medicine

## 2019-11-23 ENCOUNTER — Inpatient Hospital Stay: Payer: Federal, State, Local not specified - PPO | Attending: Internal Medicine | Admitting: Internal Medicine

## 2019-11-23 ENCOUNTER — Telehealth: Payer: Self-pay | Admitting: Medical Oncology

## 2019-11-23 VITALS — BP 110/60 | HR 73 | Temp 98.2°F | Resp 18 | Ht 62.0 in | Wt 128.3 lb

## 2019-11-23 DIAGNOSIS — C3411 Malignant neoplasm of upper lobe, right bronchus or lung: Secondary | ICD-10-CM | POA: Diagnosis present

## 2019-11-23 DIAGNOSIS — Z95828 Presence of other vascular implants and grafts: Secondary | ICD-10-CM

## 2019-11-23 DIAGNOSIS — C7951 Secondary malignant neoplasm of bone: Secondary | ICD-10-CM | POA: Diagnosis not present

## 2019-11-23 DIAGNOSIS — K5900 Constipation, unspecified: Secondary | ICD-10-CM | POA: Diagnosis not present

## 2019-11-23 DIAGNOSIS — R599 Enlarged lymph nodes, unspecified: Secondary | ICD-10-CM | POA: Diagnosis not present

## 2019-11-23 DIAGNOSIS — R937 Abnormal findings on diagnostic imaging of other parts of musculoskeletal system: Secondary | ICD-10-CM

## 2019-11-23 DIAGNOSIS — Z5112 Encounter for antineoplastic immunotherapy: Secondary | ICD-10-CM

## 2019-11-23 DIAGNOSIS — Z79899 Other long term (current) drug therapy: Secondary | ICD-10-CM | POA: Insufficient documentation

## 2019-11-23 DIAGNOSIS — Z1211 Encounter for screening for malignant neoplasm of colon: Secondary | ICD-10-CM

## 2019-11-23 LAB — COMPREHENSIVE METABOLIC PANEL
ALT: 16 U/L (ref 0–44)
AST: 20 U/L (ref 15–41)
Albumin: 3.8 g/dL (ref 3.5–5.0)
Alkaline Phosphatase: 61 U/L (ref 38–126)
Anion gap: 8 (ref 5–15)
BUN: 23 mg/dL (ref 8–23)
CO2: 26 mmol/L (ref 22–32)
Calcium: 8.7 mg/dL — ABNORMAL LOW (ref 8.9–10.3)
Chloride: 104 mmol/L (ref 98–111)
Creatinine, Ser: 0.82 mg/dL (ref 0.44–1.00)
GFR calc Af Amer: >60 mL/min (ref >60)
GFR calc non Af Amer: >60 mL/min (ref >60)
Glucose, Bld: 120 mg/dL — ABNORMAL HIGH (ref 70–99)
Potassium: 3.7 mmol/L (ref 3.5–5.1)
Sodium: 138 mmol/L (ref 135–145)
Total Bilirubin: 0.5 mg/dL (ref 0.3–1.2)
Total Protein: 6.7 g/dL (ref 6.5–8.1)

## 2019-11-23 LAB — CBC WITH DIFFERENTIAL/PLATELET
Abs Immature Granulocytes: 0.03 10*3/uL (ref 0.00–0.07)
Basophils Absolute: 0.1 10*3/uL (ref 0.0–0.1)
Basophils Relative: 1 %
Eosinophils Absolute: 0.2 10*3/uL (ref 0.0–0.5)
Eosinophils Relative: 2 %
HCT: 35.6 % — ABNORMAL LOW (ref 36.0–46.0)
Hemoglobin: 12.2 g/dL (ref 12.0–15.0)
Immature Granulocytes: 0 %
Lymphocytes Relative: 20 %
Lymphs Abs: 1.8 10*3/uL (ref 0.7–4.0)
MCH: 32.4 pg (ref 26.0–34.0)
MCHC: 34.3 g/dL (ref 30.0–36.0)
MCV: 94.7 fL (ref 80.0–100.0)
Monocytes Absolute: 0.8 10*3/uL (ref 0.1–1.0)
Monocytes Relative: 9 %
Neutro Abs: 6.1 10*3/uL (ref 1.7–7.7)
Neutrophils Relative %: 68 %
Platelets: 208 10*3/uL (ref 150–400)
RBC: 3.76 MIL/uL — ABNORMAL LOW (ref 3.87–5.11)
RDW: 13.8 % (ref 11.5–15.5)
WBC: 9 10*3/uL (ref 4.0–10.5)
nRBC: 0 % (ref 0.0–0.2)

## 2019-11-23 LAB — TSH: TSH: 1.093 u[IU]/mL (ref 0.308–3.960)

## 2019-11-23 MED ORDER — SODIUM CHLORIDE 0.9 % IV SOLN
1500.0000 mg | Freq: Once | INTRAVENOUS | Status: AC
  Administered 2019-11-23: 1500 mg via INTRAVENOUS
  Filled 2019-11-23: qty 30

## 2019-11-23 MED ORDER — SODIUM CHLORIDE 0.9% FLUSH
10.0000 mL | Freq: Once | INTRAVENOUS | Status: AC
  Administered 2019-11-23: 10 mL
  Filled 2019-11-23: qty 10

## 2019-11-23 MED ORDER — SODIUM CHLORIDE 0.9% FLUSH
10.0000 mL | INTRAVENOUS | Status: DC | PRN
  Administered 2019-11-23: 10 mL
  Filled 2019-11-23: qty 10

## 2019-11-23 MED ORDER — HEPARIN SOD (PORK) LOCK FLUSH 100 UNIT/ML IV SOLN
500.0000 [IU] | Freq: Once | INTRAVENOUS | Status: AC | PRN
  Administered 2019-11-23: 500 [IU]
  Filled 2019-11-23: qty 5

## 2019-11-23 MED ORDER — SODIUM CHLORIDE 0.9 % IV SOLN
Freq: Once | INTRAVENOUS | Status: AC
  Filled 2019-11-23: qty 250

## 2019-11-23 NOTE — Telephone Encounter (Signed)
Cardiology did try to call pt ,but did not get an answer. I gave pt the number to call for appt.

## 2019-11-23 NOTE — Progress Notes (Signed)
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  PRIOR THERAPY: None  CURRENT THERAPY: Palliative systemic chemotherapy with carboplatin for AUC of 5 on day 1, etoposide 100 mg/M2 on days 1, 2 and 3 as well as Imfinzi 1500 mg every 3 weeks with the chemotherapy and Neulasta support. First dose on 03/16/2019. Status post 10 cycles.   Starting from cycle #5 the patient will be treated with maintenance treatment with Imfinzi 1500 mg IV every 4 weeks.  INTERVAL HISTORY: Brittany Marks 79 y.o. female returns to the clinic today for follow-up visit.  The patient is feeling fine today with no concerning complaints.  She denied having any current chest pain, shortness of breath, cough or hemoptysis.  She denied having any fever or chills.  She has no nausea, vomiting, diarrhea or constipation.  She has no headache or visual changes.  She has been tolerating her maintenance treatment with Tecentriq fairly well.  She is here today for evaluation before starting cycle #11.  MEDICAL HISTORY: Past Medical History:  Diagnosis Date  . Cataract    Bilateral  . Constipation    pain medication  . Left rotator cuff tear   . Right rotator cuff tear   . SCL CA dx'd 11/2018    ALLERGIES:  is allergic to diclofenac.  MEDICATIONS:  Current Outpatient Medications  Medication Sig Dispense Refill  . b complex vitamins tablet Take 1 tablet by mouth daily.    . COD LIVER OIL PO Take 1 tablet by mouth daily.     Marland Kitchen lidocaine-prilocaine (EMLA) cream Apply 1 application topically as needed. 30 g 0  . Multiple Vitamin  (MULTIVITAMIN) tablet Take 1 tablet by mouth daily.    . nicotine (NICODERM CQ) 21 mg/24hr patch Place 1 patch (21 mg total) onto the skin daily. 28 patch 0  . oxymetazoline (AFRIN) 0.05 % nasal spray Place 2 sprays into the nose 2 (two) times daily as needed for congestion.    Vladimir Faster Glycol-Propyl Glycol (SYSTANE) 0.4-0.3 % SOLN Apply 1 drop to eye 2 (two) times daily as needed (dry eyes).    . prochlorperazine (COMPAZINE) 10 MG tablet Take 1 tablet (10 mg total) by mouth every 6 (six) hours as needed for nausea or vomiting. (Patient not taking: Reported on 08/03/2019) 30 tablet 0  . sodium chloride (OCEAN) 0.65 % nasal spray Place 1 spray into the nose 2 (two) times daily as needed for congestion.    . traMADol (ULTRAM) 50 MG tablet TAKE 1 TABLET BY MOUTH EVERY 6 TO 8 HOURS AS NEEDED FOR PAIN    . vitamin C (ASCORBIC ACID) 500 MG tablet Take 500 mg by mouth daily.    . vitamin E 400 UNIT capsule Take 400 Units by mouth daily.     No current facility-administered medications for this visit.   Facility-Administered Medications Ordered in Other Visits  Medication Dose Route Frequency Provider Last Rate Last Admin  . heparin lock flush 100 unit/mL  500 Units Intracatheter Once PRN Magrinat, Virgie Dad, MD      . sodium chloride flush (NS) 0.9 % injection 10 mL  10  mL Intracatheter PRN Magrinat, Virgie Dad, MD        SURGICAL HISTORY:  Past Surgical History:  Procedure Laterality Date  . CATARACT EXTRACTION W/ INTRAOCULAR LENS  IMPLANT, BILATERAL  2012  . DECOMPRESSIVE LUMBAR LAMINECTOMY LEVEL 2 N/A 07/29/2013   Procedure: LUMBAR LAMINECTOMY, DECOMPRESSION LUMBAR THREE TO FOUR, FOUR TO FIVE microdiscectomy l3,4 right;  Surgeon: Tobi Bastos, MD;  Location: [facility];  Service: Orthopedics;  Laterality: N/A;  . I & D SUPERIOR RIGHT SHOULDER AND CLOSURE WOUND  01-10-2011   S/P ROTATOR CUFF REPAIR  . IR IMAGING GUIDED PORT INSERTION  04/09/2019  . LUMBAR LAMINECTOMY  1970'S  . LUMBAR  LAMINECTOMY/DECOMPRESSION MICRODISCECTOMY N/A 06/27/2016   Procedure: L1 - L2 DISCECTOMY;  Surgeon: Melina Schools, MD;  Location: [facility];  Service: Orthopedics;  Laterality: N/A;  . [place]  . RIGHT SHOULDER ARTHROSCOPY/ OPEN DISTAL CLAVICLE RESECTION/ SAD/ OPEN ROTATOR CUFF REPAIR  11-28-2010  . SHOULDER OPEN ROTATOR CUFF REPAIR  12/19/2011   Procedure: ROTATOR CUFF REPAIR SHOULDER OPEN;  Surgeon: Magnus Sinning, MD;  Location: [facility];  Service: Orthopedics;  Laterality: Right;  RIGHT RECURRENT OPEN REPAIR OF THE ROTATOR CUFF WITH TISSUE MEND GRAFTANTERIOR CHROMIOECTOMY  . VAGINAL HYSTERECTOMY  1979    REVIEW OF SYSTEMS:  A comprehensive review of systems was negative except for: Constitutional: positive for fatigue Respiratory: positive for dyspnea on exertion   PHYSICAL EXAMINATION: General appearance: alert, cooperative, fatigued and no distress Head: Normocephalic, without obvious abnormality, atraumatic Neck: no adenopathy, no JVD, supple, symmetrical, trachea midline and thyroid not enlarged, symmetric, no tenderness/mass/nodules Lymph nodes: Cervical, supraclavicular, and axillary nodes normal. Resp: clear to auscultation bilaterally Back: symmetric, no curvature. ROM normal. No CVA tenderness. Cardio: regular rate and rhythm, S1, S2 normal, no murmur, click, rub or gallop GI: soft, non-tender; bowel sounds normal; no masses,  no organomegaly Extremities: extremities normal, atraumatic, no cyanosis or edema   ECOG PERFORMANCE STATUS: 1 - Symptomatic but completely ambulatory  Blood pressure 110/60, pulse 73, temperature 98.2 F (36.8 C), temperature source Temporal, resp. rate 18, height 5\' 2"  (1.575 m), weight 128 lb 4.8 oz (58.2 kg), SpO2 98 %.  LABORATORY DATA: Lab Results  Component Value Date   WBC 8.4 10/22/2019   HGB 12.1 10/22/2019   HCT 35.9 (L) 10/22/2019   MCV 93.7 10/22/2019   PLT 205 10/22/2019      Chemistry       Component Value Date/Time   NA 138 10/22/2019 1045   K 4.1 10/22/2019 1045   CL 104 10/22/2019 1045   CO2 25 10/22/2019 1045   BUN 15 10/22/2019 1045   CREATININE 0.73 10/22/2019 1045   CREATININE 0.73 06/01/2019 1305      Component Value Date/Time   CALCIUM 9.1 10/22/2019 1045   ALKPHOS 70 10/22/2019 1045   AST 21 10/22/2019 1045   AST 22 06/01/2019 1305   ALT 14 10/22/2019 1045   ALT 15 06/01/2019 1305   BILITOT 0.5 10/22/2019 1045   BILITOT 0.3 06/01/2019 1305       RADIOGRAPHIC STUDIES: No results found.  ASSESSMENT AND PLAN: This is a very pleasant 79 years old white female with extensive stage small cell lung cancer and she is currently undergoing treatment with carboplatin, etoposide and Imfinzi status post 5 cycles.  Starting from cycle #5 the patient is on maintenance treatment with single agent Imfinzi every 4 weeks status post 5 cycles. The patient has been tolerating her  maintenance treatment fairly well with no concerning adverse effects. I recommended for her to proceed with cycle #11 of her treatment today as planned. I will see her back for follow-up visit in 3 weeks for evaluation before the next cycle of her treatment. The patient was advised to call immediately if she has any concerning symptoms in the interval. The patient voices understanding of current disease status and treatment options and is in agreement with the current care plan.  All questions were answered. The patient knows to call the clinic with any problems, questions or concerns. We can certainly see the patient much sooner if necessary.  Disclaimer: This note was dictated with voice recognition software. Similar sounding words can inadvertently be transcribed and may not be corrected upon review.

## 2019-11-23 NOTE — Patient Instructions (Signed)
[facility] Discharge Instructions for Patients Receiving Chemotherapy  Today you received the following chemotherapy agents: Imfinzi.  To help prevent nausea and vomiting after your treatment, we encourage you to take your nausea medication as directed.   If you develop nausea and vomiting that is not controlled by your nausea medication, call the clinic.   BELOW ARE SYMPTOMS THAT SHOULD BE REPORTED IMMEDIATELY:  *FEVER GREATER THAN 100.5 F  *CHILLS WITH OR WITHOUT FEVER  NAUSEA AND VOMITING THAT IS NOT CONTROLLED WITH YOUR NAUSEA MEDICATION  *UNUSUAL SHORTNESS OF BREATH  *UNUSUAL BRUISING OR BLEEDING  TENDERNESS IN MOUTH AND THROAT WITH OR WITHOUT PRESENCE OF ULCERS  *URINARY PROBLEMS  *BOWEL PROBLEMS  UNUSUAL RASH Items with * indicate a potential emergency and should be followed up as soon as possible.  Feel free to call the clinic should you have any questions or concerns. The clinic phone number is [phone].  Please show the CHEMO ALERT CARD at check-in to the Emergency Department and triage nurse.   

## 2019-11-23 NOTE — Telephone Encounter (Signed)
I spoke to pt and gave her number to cardiology.

## 2019-11-25 ENCOUNTER — Telehealth: Payer: Self-pay | Admitting: Internal Medicine

## 2019-11-25 NOTE — Telephone Encounter (Signed)
Scheduled per los. Called and left msg. Mailed printout  °

## 2019-11-27 ENCOUNTER — Telehealth: Payer: Self-pay | Admitting: Internal Medicine

## 2019-11-27 NOTE — Telephone Encounter (Signed)
Returned patient's phone call regarding rescheduling March and April appointment, per patient's request appointment time frames have been rescheduled.

## 2019-11-29 ENCOUNTER — Ambulatory Visit: Payer: Federal, State, Local not specified - PPO | Attending: Internal Medicine

## 2019-11-29 DIAGNOSIS — Z23 Encounter for immunization: Secondary | ICD-10-CM | POA: Insufficient documentation

## 2019-11-29 NOTE — Progress Notes (Signed)
   Covid-19 Vaccination Clinic  Name:  Brittany Marks    MRN: [id number] DOB: 07-25-1941  11/29/2019  [name] was observed post Covid-19 immunization for 15 minutes without incidence. She was provided with Vaccine Information Sheet and instruction to access the V-Safe system.   [name] was instructed to call 911 with any severe reactions post vaccine: Marland Kitchen Difficulty breathing  . Swelling of your face and throat  . A fast heartbeat  . A bad rash all over your body  . Dizziness and weakness    Immunizations Administered    Name Date Dose VIS Date Route   Pfizer COVID-19 Vaccine 11/29/2019 10:37 AM 0.3 mL 09/18/2019 Intramuscular   Manufacturer: [place]   Lot: [id number]   [place]: [id number]

## 2019-12-01 NOTE — Progress Notes (Signed)
Cardiology Office Note:   Date:  12/04/2019  NAME:  Brittany Marks    MRN: [id number] DOB:  [birth year]   PCP:  Patient, No Pcp Per  Cardiologist:  No primary care provider on file.   Referring MD: Heilingoetter, Cassandr*   Chief Complaint  Patient presents with  . Coronary Artery Disease    History of Present Illness:   Brittany Marks is a 79 y.o. female with a hx of metastatic small cell lung CA who is being seen today for the evaluation of CAD at the request of Heilingoetter, Cassandr*.  She has metastatic small cell lung cancer.  She is on palliative chemotherapy.  She reports being referred to her cardiologist was a surprise to her.  She is never had any heart issues.  She does have extensive calcification seen on a CT lung scan.  Despite this she reports no symptoms of chest pain.  She does get quite short of breath with activity such as walking around her house.  That is about all she does.  No dedicated exercise regimen.  She does have emphysema which I informed her it was seen on her CT scan.  She has never seen a pulmonologist and she does not have a primary care physician.  I did inform her that I think it would be worthwhile for her to be evaluated by a pulmonologist to undergo PFTs.  She reports she is unsure if she would like to do that.  She reports that she is unsure what her prognosis is but she does not think it is that good.  I also discussed with her that it would be beneficial to take an aspirin and cholesterol medication.  She reports that given her diagnosis of metastatic lung cancer she sees no value in this.  She also has noticeable bilateral carotid bruits on examination.  I did inform her that would be a good idea to get baseline ultrasounds just in case we run into issues later in her care.  She is amendable to this.  She appears to express a strong desire for conservative measures regarding her care.  She is still smoking.  Has nearly 50-pack-year history.  Blood  pressure bit elevated but she reports she is nervous.  She denies chest pain, shortness of breath, palpitations.  She denies orthopnea, PND, lower extremity edema.  She reports no cramping or claudication symptoms.  Problem List 1. 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 CAD -CAC seen on CT  Past Medical History: Past Medical History:  Diagnosis Date  . Cataract    Bilateral  . Constipation    pain medication  . Left rotator cuff tear   . Right rotator cuff tear   . SCL CA dx'd 11/2018    Past Surgical History: Past Surgical History:  Procedure Laterality Date  . CATARACT EXTRACTION W/ INTRAOCULAR LENS  IMPLANT, BILATERAL  2012  . DECOMPRESSIVE LUMBAR LAMINECTOMY LEVEL 2 N/A 07/29/2013   Procedure: LUMBAR LAMINECTOMY, DECOMPRESSION LUMBAR THREE TO FOUR, FOUR TO FIVE microdiscectomy l3,4 right;  Surgeon: Tobi Bastos, MD;  Location: [facility];  Service: Orthopedics;  Laterality: N/A;  . I & D SUPERIOR RIGHT SHOULDER AND CLOSURE WOUND  01-10-2011   S/P ROTATOR CUFF REPAIR  . IR IMAGING GUIDED PORT INSERTION  04/09/2019  . LUMBAR LAMINECTOMY  1970'S  . LUMBAR LAMINECTOMY/DECOMPRESSION MICRODISCECTOMY N/A 06/27/2016   Procedure: L1 - L2 DISCECTOMY;  Surgeon: Melina Schools, MD;  Location: [facility];  Service: Orthopedics;  Laterality: N/A;  . [place]  . RIGHT SHOULDER ARTHROSCOPY/ OPEN DISTAL CLAVICLE RESECTION/ SAD/ OPEN ROTATOR CUFF REPAIR  11-28-2010  . SHOULDER OPEN ROTATOR CUFF REPAIR  12/19/2011   Procedure: ROTATOR CUFF REPAIR SHOULDER OPEN;  Surgeon: Magnus Sinning, MD;  Location: [facility];  Service: Orthopedics;  Laterality: Right;  RIGHT RECURRENT OPEN REPAIR OF THE ROTATOR CUFF WITH TISSUE MEND GRAFTANTERIOR CHROMIOECTOMY  . VAGINAL HYSTERECTOMY  1979    Current Medications: Current Meds  Medication Sig  . b complex vitamins tablet Take 1 tablet by mouth daily.  . COD LIVER OIL PO Take 1 tablet by mouth daily.   .  diphenhydrAMINE HCl (BENADRYL ALLERGY PO) Take 1 tablet by mouth daily.  . Multiple Vitamin (MULTIVITAMIN) tablet Take 1 tablet by mouth daily.  Marland Kitchen oxymetazoline (AFRIN) 0.05 % nasal spray Place 2 sprays into the nose 2 (two) times daily as needed for congestion.  Vladimir Faster Glycol-Propyl Glycol (SYSTANE) 0.4-0.3 % SOLN Apply 1 drop to eye 2 (two) times daily as needed (dry eyes).  . prochlorperazine (COMPAZINE) 10 MG tablet Take 1 tablet (10 mg total) by mouth every 6 (six) hours as needed for nausea or vomiting.  . sodium chloride (OCEAN) 0.65 % nasal spray Place 1 spray into the nose 2 (two) times daily as needed for congestion.  . vitamin C (ASCORBIC ACID) 500 MG tablet Take 500 mg by mouth daily.  . vitamin E 400 UNIT capsule Take 400 Units by mouth daily.  . [DISCONTINUED] traMADol (ULTRAM) 50 MG tablet TAKE 1 TABLET BY MOUTH EVERY 6 TO 8 HOURS AS NEEDED FOR PAIN     Allergies:    Diclofenac   Social History: Social History   Socioeconomic History  . Marital status: Divorced    Spouse name: Not on file  . Number of children: 1  . Years of education: Not on file  . Highest education level: Not on file  Occupational History  . Not on file  Tobacco Use  . Smoking status: Current Every Day Smoker    Packs/day: 0.25    Years: 50.00    Pack years: 12.50    Types: Cigarettes  . Smokeless tobacco: Never Used  . Tobacco comment: trying to quit now, using the patch  Substance and Sexual Activity  . Alcohol use: Yes    Comment: RARE  . Drug use: No  . Sexual activity: Not on file  Other Topics Concern  . Not on file  Social History Narrative  . Not on file   Social Determinants of Health   Financial Resource Strain:   . Difficulty of Paying Living Expenses: Not on file  Food Insecurity:   . Worried About Charity fundraiser in the Last Year: Not on file  . Ran Out of Food in the Last Year: Not on file  Transportation Needs: No Transportation Needs  . Lack of  Transportation (Medical): No  . Lack of Transportation (Non-Medical): No  Physical Activity:   . Days of Exercise per Week: Not on file  . Minutes of Exercise per Session: Not on file  Stress:   . Feeling of Stress : Not on file  Social Connections:   . Frequency of Communication with Friends and Family: Not on file  . Frequency of Social Gatherings with Friends and Family: Not on file  . Attends Religious Services: Not on file  . Active Member of Clubs or Organizations: Not on file  .  Attends Archivist Meetings: Not on file  . Marital Status: Not on file     Family History: The patient's family history includes Congestive Heart Failure in her father and mother.  ROS:   All other ROS reviewed and negative. Pertinent positives noted in the HPI.     EKGs/Labs/Other Studies Reviewed:   The following studies were personally reviewed by me today:  EKG:  EKG is ordered today.  The ekg ordered today demonstrates normal sinus rhythm, heart rate 86, poor R wave progression noted, and was personally reviewed by me.   Recent Labs: 11/23/2019: ALT 16; BUN 23; Creatinine, Ser 0.82; Hemoglobin 12.2; Platelets 208; Potassium 3.7; Sodium 138; TSH 1.093   Recent Lipid Panel No results found for: CHOL, TRIG, HDL, CHOLHDL, VLDL, LDLCALC, LDLDIRECT  Physical Exam:   VS:  BP (!) 146/100 (BP Location: Left Arm, Patient Position: Sitting, Cuff Size: Normal)   Pulse 86   Ht 5\' 2"  (1.575 m)   Wt 132 lb (59.9 kg)   BMI 24.14 kg/m    Wt Readings from Last 3 Encounters:  12/04/19 132 lb (59.9 kg)  11/23/19 128 lb 4.8 oz (58.2 kg)  10/26/19 128 lb 6.4 oz (58.2 kg)    General: Well nourished, well developed, in no acute distress Heart: Atraumatic, normal size  Eyes: PEERLA, EOMI  Neck: Supple, no JVD Endocrine: No thryomegaly Cardiac: Normal S1, S2; RRR; no murmurs, rubs, or gallops Lungs: Rhonchi noted bilaterally Abd: Soft, nontender, no hepatomegaly  Ext: No edema, pulses  2+ Musculoskeletal: No deformities, BUE and BLE strength normal and equal Skin: Warm and dry, no rashes   Neuro: Alert and oriented to person, place, time, and situation, CNII-XII grossly intact, no focal deficits  Psych: Normal mood and affect   ASSESSMENT:   Brittany Marks is a 79 y.o. female who presents for the following: 1. Coronary artery disease involving native coronary artery of native heart without angina pectoris   2. Bilateral carotid bruits     PLAN:   1. Coronary artery disease involving native coronary artery of native heart without angina pectoris -Extensive coronary calcification seen on CT scan.  She does get short of breath but no chest pain reported.  I suspect most of her symptoms are related to COPD which is undiagnosed.  I have informed her that it may be beneficial to see a pulmonologist undergo PFTs.  They may be able to help her with inhalers.  She reports she is not wanting to do many things will discuss this with her oncologist.  I did discuss that she may benefit from aspirin and statin medications.  It is unclear what her overall prognosis is.  I did inform her that she would need to be on these medications for quite some time to see a benefit.  She did express a desire to me that she is not willing to do medications unless there will actually benefit her.  In a patient with metastatic small cell lung cancer I am really uncertain if she will get a benefit of aspirin and statin therapy.  We have therefore decided to not pursue these medications at this time.  Her cardiovascular examination is without murmurs.  She has no evidence of heart failure examination.  Her EKG today demonstrates normal sinus rhythm with poor R wave progression.  This is likely COPD related.  We will not pursue a cardiac ultrasound this time.  I have also encouraged her to find a primary care physician.  I think this will be important moving forward especially if her care progresses towards  hospice.  2. Bilateral carotid bruits -She does have noticeable strong carotid bruits on examination.  This is further reason for aspirin statin medication.  Again she is not wanting to do medications at possibly will not benefit her.  I think we should just proceed with a baseline ultrasound of her neck just to see what we are dealing with.  This will be helpful should she have a change in symptoms in the near future.  Overall, she has expressed a desire for conservative measures to focus on quality of life.  I do agree with her focus on this.  We will plan to see her back in 3 months to see how her symptoms go.  Disposition: Return in about 3 months (around 03/02/2020).  Medication Adjustments/Labs and Tests Ordered: Current medicines are reviewed at length with the patient today.  Concerns regarding medicines are outlined above.  Orders Placed This Encounter  Procedures  . EKG 12-Lead  . VAS US CAROTID   No orders of the defined types were placed in this encounter.   Patient Instructions  Medication Instructions:  The current medical regimen is effective;  continue present plan and medications.  *If you need a refill on your cardiac medications before your next appointment, please call your pharmacy*   Testing/Procedures: Your physician has requested that you have a carotid duplex. This test is an ultrasound of the carotid arteries in your neck. It looks at blood flow through these arteries that supply the brain with blood. Allow one hour for this exam. There are no restrictions or special instructions.   Follow-Up: At [facility], you and your health needs are our priority.  As part of our continuing mission to provide you with exceptional heart care, we have created designated Provider Care Teams.  These Care Teams include your primary Cardiologist (physician) and Advanced Practice Providers (APPs -  Physician Assistants and Nurse Practitioners) who all work together to provide  you with the care you need, when you need it.  We recommend signing up for the patient portal called "MyChart".  Sign up information is provided on this After Visit Summary.  MyChart is used to connect with patients for Virtual Visits (Telemedicine).  Patients are able to view lab/test results, encounter notes, upcoming appointments, etc.  Non-urgent messages can be sent to your provider as well.   To learn more about what you can do with MyChart, go to [email or web address].    Your next appointment:   3 month(s)  The format for your next appointment:   In Person  Provider:   Eleonore Chiquito, MD       Signed, Addison Naegeli. Audie Box, [place]  [address] [place], [place] [address] [phone]  12/04/2019 11:43 AM

## 2019-12-04 ENCOUNTER — Ambulatory Visit (INDEPENDENT_AMBULATORY_CARE_PROVIDER_SITE_OTHER): Payer: Federal, State, Local not specified - PPO | Admitting: Cardiovascular Disease

## 2019-12-04 ENCOUNTER — Encounter: Payer: Self-pay | Admitting: Cardiovascular Disease

## 2019-12-04 ENCOUNTER — Other Ambulatory Visit: Payer: Self-pay

## 2019-12-04 VITALS — BP 146/100 | HR 86 | Ht 62.0 in | Wt 132.0 lb

## 2019-12-04 DIAGNOSIS — I2510 Atherosclerotic heart disease of native coronary artery without angina pectoris: Secondary | ICD-10-CM | POA: Diagnosis not present

## 2019-12-04 DIAGNOSIS — R0989 Other specified symptoms and signs involving the circulatory and respiratory systems: Secondary | ICD-10-CM

## 2019-12-04 NOTE — Patient Instructions (Signed)
Medication Instructions:  The current medical regimen is effective;  continue present plan and medications.  *If you need a refill on your cardiac medications before your next appointment, please call your pharmacy*   Testing/Procedures: Your physician has requested that you have a carotid duplex. This test is an ultrasound of the carotid arteries in your neck. It looks at blood flow through these arteries that supply the brain with blood. Allow one hour for this exam. There are no restrictions or special instructions.   Follow-Up: At [facility], you and your health needs are our priority.  As part of our continuing mission to provide you with exceptional heart care, we have created designated Provider Care Teams.  These Care Teams include your primary Cardiologist (physician) and Advanced Practice Providers (APPs -  Physician Assistants and Nurse Practitioners) who all work together to provide you with the care you need, when you need it.  We recommend signing up for the patient portal called "MyChart".  Sign up information is provided on this After Visit Summary.  MyChart is used to connect with patients for Virtual Visits (Telemedicine).  Patients are able to view lab/test results, encounter notes, upcoming appointments, etc.  Non-urgent messages can be sent to your provider as well.   To learn more about what you can do with MyChart, go to [email or web address].    Your next appointment:   3 month(s)  The format for your next appointment:   In Person  Provider:   Eleonore Chiquito, MD

## 2019-12-11 ENCOUNTER — Other Ambulatory Visit (HOSPITAL_COMMUNITY): Payer: Self-pay | Admitting: Cardiovascular Disease

## 2019-12-11 ENCOUNTER — Ambulatory Visit (HOSPITAL_COMMUNITY)
Admission: RE | Admit: 2019-12-11 | Discharge: 2019-12-11 | Disposition: A | Discharge status: Home / Self Care | Payer: Federal, State, Local not specified - PPO | Source: Ambulatory Visit | Attending: Cardiovascular Disease | Admitting: Cardiovascular Disease

## 2019-12-11 ENCOUNTER — Other Ambulatory Visit: Payer: Self-pay

## 2019-12-11 DIAGNOSIS — R0989 Other specified symptoms and signs involving the circulatory and respiratory systems: Secondary | ICD-10-CM | POA: Diagnosis present

## 2019-12-11 DIAGNOSIS — I6523 Occlusion and stenosis of bilateral carotid arteries: Secondary | ICD-10-CM

## 2019-12-17 ENCOUNTER — Other Ambulatory Visit: Payer: Self-pay | Admitting: Medical Oncology

## 2019-12-17 DIAGNOSIS — C3411 Malignant neoplasm of upper lobe, right bronchus or lung: Secondary | ICD-10-CM

## 2019-12-19 NOTE — Progress Notes (Signed)
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  PRIOR THERAPY: Palliative radiotherapy to theleft proximal humerusunder the care of Dr. Lisbeth Renshaw.  CURRENT THERAPY: Palliative systemic chemotherapy with carboplatin for AUC of 5 on day 1, etoposide 100 mg/M2 on days 1, 2 and 3 as well as Imfinzi 1500 mg every 3 weeks with the chemotherapy and Neulasta support. First dose on 03/16/2019. Status post11cycles.Starting from cycle #5 the patient will be treated with maintenance treatment with Imfinzi 1500 mg IV every 4 weeks.  INTERVAL HISTORY: KEARSTEN GINTHER 79 y.o. female returns to the clinic for a follow up visit. The patient is feeling well today without any concerning complaints except she has had gradual worsening achy pain the left upper extremity over the last 3-4 weeks. The patient has a history of a metastatic lesion to this region. She uses ibuprofen for her pain. She states tylenol does not help. She state the pain is constant. She denies any palpable lesions or changes in ROM of her arm. She denies any overlying skin changes to this region.   Otherwise, the patient continues to tolerate treatment with maintenance immunotherapy with Imfinziwell without any adverse effects except dry  Scattered skin lesions. She denies any significant pruritis. She had these previously but there are more lesions than at prior appointments. She has a rash on the dorsal aspect of her hands bilaterally and a few scattered lesions on her legs and forearms and a few on her chest. She states they do not bother her. She reportedly tried to use hydrocortisone cream without relief.  Denies any fever, chills, night sweats, or weight loss. Denies any chest pain, cough, or hemoptysis.She reports her baseline shortness of breath with exertion.Denies any nausea, vomiting, diarrhea, or constipation. Denies any headache or visual changes. She is here today for evaluation before starting cycle #12.   MEDICAL HISTORY: Past Medical History:  Diagnosis Date  . Cataract    Bilateral  . Constipation    pain medication  . Left rotator cuff tear   . Right rotator cuff tear   . SCL CA dx'd 11/2018    ALLERGIES:  is allergic to diclofenac.  MEDICATIONS:  Current Outpatient Medications  Medication Sig Dispense Refill  . b complex vitamins tablet Take 1 tablet by mouth daily.    . COD LIVER OIL PO Take 1 tablet by mouth daily.     . diphenhydrAMINE HCl (BENADRYL ALLERGY PO) Take 1 tablet by mouth daily.    . hydrocortisone 2.5 % cream Apply topically as needed. 3.5 g 0  . Multiple Vitamin (MULTIVITAMIN) tablet Take 1 tablet by mouth daily.    Marland Kitchen oxymetazoline (AFRIN) 0.05 % nasal spray Place 2 sprays into the nose 2 (two) times daily as needed for congestion.    Vladimir Faster Glycol-Propyl Glycol (SYSTANE) 0.4-0.3 % SOLN Apply 1 drop to eye 2 (two) times daily as needed (dry eyes).    . prochlorperazine (COMPAZINE) 10 MG tablet Take 1 tablet (10 mg total) by mouth every 6 (six) hours as needed for nausea or vomiting. 30 tablet 0  . sodium chloride (OCEAN) 0.65 % nasal spray Place 1 spray into the nose 2 (two) times daily as needed  for congestion.    . vitamin C (ASCORBIC ACID) 500 MG tablet Take 500 mg by mouth daily.    . vitamin E 400 UNIT capsule Take 400 Units by mouth daily.     No current facility-administered medications for this visit.   Facility-Administered Medications Ordered in Other Visits  Medication Dose Route Frequency Provider Last Rate Last Admin  . heparin lock flush 100 unit/mL  500 Units Intracatheter Once PRN Magrinat, Virgie Dad, MD      . sodium chloride  flush (NS) 0.9 % injection 10 mL  10 mL Intracatheter PRN Magrinat, Virgie Dad, MD      . sodium chloride flush (NS) 0.9 % injection 10 mL  10 mL Intracatheter PRN Curt Bears, MD   10 mL at 12/21/19 1346    SURGICAL HISTORY:  Past Surgical History:  Procedure Laterality Date  . CATARACT EXTRACTION W/ INTRAOCULAR LENS  IMPLANT, BILATERAL  2012  . DECOMPRESSIVE LUMBAR LAMINECTOMY LEVEL 2 N/A 07/29/2013   Procedure: LUMBAR LAMINECTOMY, DECOMPRESSION LUMBAR THREE TO FOUR, FOUR TO FIVE microdiscectomy l3,4 right;  Surgeon: Tobi Bastos, MD;  Location: [facility];  Service: Orthopedics;  Laterality: N/A;  . I & D SUPERIOR RIGHT SHOULDER AND CLOSURE WOUND  01-10-2011   S/P ROTATOR CUFF REPAIR  . IR IMAGING GUIDED PORT INSERTION  04/09/2019  . LUMBAR LAMINECTOMY  1970'S  . LUMBAR LAMINECTOMY/DECOMPRESSION MICRODISCECTOMY N/A 06/27/2016   Procedure: L1 - L2 DISCECTOMY;  Surgeon: Melina Schools, MD;  Location: [facility];  Service: Orthopedics;  Laterality: N/A;  . [place]  . RIGHT SHOULDER ARTHROSCOPY/ OPEN DISTAL CLAVICLE RESECTION/ SAD/ OPEN ROTATOR CUFF REPAIR  11-28-2010  . SHOULDER OPEN ROTATOR CUFF REPAIR  12/19/2011   Procedure: ROTATOR CUFF REPAIR SHOULDER OPEN;  Surgeon: Magnus Sinning, MD;  Location: [facility];  Service: Orthopedics;  Laterality: Right;  RIGHT RECURRENT OPEN REPAIR OF THE ROTATOR CUFF WITH TISSUE MEND GRAFTANTERIOR CHROMIOECTOMY  . VAGINAL HYSTERECTOMY  1979    REVIEW OF SYSTEMS:   Review of Systems  Constitutional:Positive for fatigue. Negative for appetite change, chills,  fever and unexpected weight change.  HENT: Negative for mouth sores, nosebleeds, sore throat and trouble swallowing.   Eyes: Negative for eye problems and icterus.  Respiratory: Positive for baseline dyspnea on exertion. Negative for cough, hemoptysis, and wheezing.   Cardiovascular: Negative for chest pain and leg swelling.  Gastrointestinal: Negative for abdominal  pain, constipation, diarrhea, nausea and vomiting.  Genitourinary: Negative for bladder incontinence, difficulty urinating, dysuria, frequency and hematuria.   Musculoskeletal: Negative for back pain, gait problem, neck pain and neck stiffness.  Skin: Positive for a few scattered dry rash, mild pruritus.   Neurological: Negative for dizziness, extremity weakness, gait problem, headaches, light-headedness and seizures.  Hematological: Negative for adenopathy. Does not bruise/bleed easily.  Psychiatric/Behavioral: Negative for confusion, depression and sleep disturbance. The patient is not nervous/anxious.     PHYSICAL EXAMINATION:  Blood pressure (!) 183/72, pulse 76, temperature 97.8 F (36.6 C), temperature source Temporal, resp. rate 18, height 5' 2" (1.575 m), weight 130 lb 6.4 oz (59.1 kg), SpO2 100 %.  ECOG PERFORMANCE STATUS: 1 - Symptomatic but completely ambulatory  Physical Exam  Constitutional: Oriented to person, place, and time and well-developed, well-nourished, and in no distress.  HENT:  Head: Normocephalic and atraumatic.  Mouth/Throat: Oropharynx is clear and moist. No oropharyngeal exudate.  Eyes: Conjunctivae are normal. Right eye exhibits no discharge. Left eye exhibits no discharge. No scleral  icterus.  Neck: Normal range of motion. Neck supple.  Cardiovascular: Normal rate, regular rhythm, normal heart sounds and intact distal pulses.   Pulmonary/Chest: Effort normal. Crackles noted at base of lung bilaterally. No respiratory distress. No wheezes.  Abdominal: Soft. Bowel sounds are normal. Exhibits no distension and no mass. There is no tenderness.  Musculoskeletal: Tenderness over left humerus. Normal range of motion. Exhibits no edema.  Lymphadenopathy:    No cervical adenopathy.  Neurological: Alert and oriented to person, place, and time. Exhibits normal muscle tone. Gait normal. Coordination normal.  Skin: Dry scattered lesions, mostly localized/coalescing on  the back of hands bilaterally. Few scattered lesions to forearms, chest, and lower extremity. Skin is warm and dry. Not diaphoretic. No erythema. No pallor.  Psychiatric: Mood, memory and judgment normal.  Vitals reviewed.  LABORATORY DATA: Lab Results  Component Value Date   WBC 8.0 12/21/2019   HGB 12.8 12/21/2019   HCT 37.6 12/21/2019   MCV 94.9 12/21/2019   PLT 193 12/21/2019      Chemistry      Component Value Date/Time   NA 138 12/21/2019 1100   K 4.4 12/21/2019 1100   CL 103 12/21/2019 1100   CO2 24 12/21/2019 1100   BUN 17 12/21/2019 1100   CREATININE 0.76 12/21/2019 1100      Component Value Date/Time   CALCIUM 9.0 12/21/2019 1100   ALKPHOS 67 12/21/2019 1100   AST 22 12/21/2019 1100   ALT 13 12/21/2019 1100   BILITOT 0.3 12/21/2019 1100       RADIOGRAPHIC STUDIES:  VAS US CAROTID  Result Date: 12/11/2019 Carotid Arterial Duplex Study Indications:      Bilateral bruits and patient denies any cerebrovascular                   symptoms. Risk Factors:     Current smoker, coronary artery disease. Comparison Study: NA Performing Technologist: Sharlett Iles RVT  Examination Guidelines: A complete evaluation includes B-mode imaging, spectral Doppler, color Doppler, and power Doppler as needed of all accessible portions of each vessel. Bilateral testing is considered an integral part of a complete examination. Limited examinations for reoccurring indications may be performed as noted.  Right Carotid Findings: +----------+--------+--------+--------+--------------------------+---------+           PSV cm/sEDV cm/sStenosisPlaque Description        Comments  +----------+--------+--------+--------+--------------------------+---------+ CCA Prox  131     11                                        turbulent +----------+--------+--------+--------+--------------------------+---------+ CCA Mid   56      7               heterogenous                         +----------+--------+--------+--------+--------------------------+---------+ CCA Distal83      13              heterogenous                        +----------+--------+--------+--------+--------------------------+---------+ ICA Prox  166     16      1-39%   heterogenous                        +----------+--------+--------+--------+--------------------------+---------+ ICA Mid  47      10                                                  +----------+--------+--------+--------+--------------------------+---------+ ICA Distal55      12                                        tortuous  +----------+--------+--------+--------+--------------------------+---------+ ECA       456     27      >50%    heterogenous and irregular          +----------+--------+--------+--------+--------------------------+---------+ +----------+--------+-------+--------+-------------------+           PSV cm/sEDV cmsDescribeArm Pressure (mmHG) +----------+--------+-------+--------+-------------------+ Subclavian280            Stenotic180                 +----------+--------+-------+--------+-------------------+ +---------+--------+--+--------+--+---------+ VertebralPSV cm/s85EDV cm/s10Antegrade +---------+--------+--+--------+--+---------+  Left Carotid Findings: +----------+--------+--------+--------+--------------------+-------------------+           PSV cm/sEDV cm/sStenosisPlaque Description  Comments            +----------+--------+--------+--------+--------------------+-------------------+ CCA Prox  62      10                                                      +----------+--------+--------+--------+--------------------+-------------------+ CCA Mid   145     18      >50%    heterogenous        shadowing           +----------+--------+--------+--------+--------------------+-------------------+ CCA Distal149     22              heterogenous                             +----------+--------+--------+--------+--------------------+-------------------+ ICA Prox  207     26      40-59%  heterogenous and    shadowing,                                            irregular           turbulent and                                                             stenosis based on                                                         PSV and plaque  formation           +----------+--------+--------+--------+--------------------+-------------------+ ICA Mid   103     15                                  turbulent           +----------+--------+--------+--------+--------------------+-------------------+ ICA Distal62      15                                  tortuous            +----------+--------+--------+--------+--------------------+-------------------+ ECA       571     27              heterogenous and    shadowing                                             irregular                               +----------+--------+--------+--------+--------------------+-------------------+ +----------+--------+--------+--------+-------------------+           PSV cm/sEDV cm/sDescribeArm Pressure (mmHG) +----------+--------+--------+--------+-------------------+ Subclavian171             Dampened140                 +----------+--------+--------+--------+-------------------+ +---------+--------+--+--------+-+----------+ VertebralPSV cm/s39EDV cm/s0Retrograde +---------+--------+--+--------+-+----------+   Summary: Right Carotid: Velocities in the right ICA are consistent with a 1-39% stenosis. Left Carotid: Velocities in the left ICA are consistent with a 40-59% stenosis. Vertebrals:  Right vertebral artery demonstrates antegrade flow. Left vertebral              artery demonstrates retrograde flow. Subclavians: Right subclavian artery was stenotic. Normal flow  hemodynamics were              seen in the left subclavian artery.  Normal biphasic waveform in the right brachial artery. Dampened biphasic waveform in the left brachial artery. There is a 20 mmHg pressure difference between the arms, the left being the lowest. 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 *See table(s) above for measurements and observations.  Electronically signed by TIda RogueMD on 12/11/2019 at 10:23:42 PM.    Final      ASSESSMENT/PLAN:  This is a very pleasant 79year old Caucasian female recently diagnosed with extensive stage (T1b, and 3, N1 C) small cell lung cancer. She presented with right upper lobe pulmonary nodules in addition to bilateral hilar and right mediastinal as well as left supraclavicular and abdominal lymphadenopathy. She also has metastatic disease to the in the left humerus. She was diagnosed in May 2020.  She is status post palliative radiotherapy to theleft proximal humerusunder the care of Dr. MLisbeth Renshaw   She is currently undergoingpalliative systemic chemotherapy with carboplatin for AUC of 5 on day 1, etoposide 100 mg/M2 on days 1, 2 and 3 as well as Imfinzi 1500 mg every 3 weeks with the chemotherapy and Neulasta support. Status post11cycles.Starting from cycle #5 the patienthas beentreated with maintenance treatment with Imfinzi 1500 mg IV every 4 weeks.  The patient was seen with Dr. MJulien Nordmann The skin lesions  are likely secondary to [place]. Lesions not bothersome to patient. Dr. Julien Nordmann would like to continue on the same treatment at the same dose. I will send prescription for hydrocortisone cream for her rash. Recommend that she continue on the same treatment at the same dose.   I will arrange for a restaging CT scan prior to her next visit to restage her disease, given her history of a metastatic lesion to the left humerus as well  as gradually increasing pain, we will also order a CT of the left humerus in addition to the chest, abdomen, and pelvis.   We will see her back for a follow up visit in 4 weeks for evaluation and to review her scan before starting cycle #13.   The patient's blood pressure was initially elevated due to her be anxious/upset about the delay in her appointment today. She is not on any anti-hypertensive. Her blood pressure was rechecked and was 143/58.   The patient was advised to call immediately if she has any concerning symptoms in the interval. The patient voices understanding of current disease status and treatment options and is in agreement with the current care plan. All questions were answered. The patient knows to call the clinic with any problems, questions or concerns. We can certainly see the patient much sooner if necessary    Orders Placed This Encounter  Procedures  . CT Chest W Contrast    Standing Status:   Future    Standing Expiration Date:   12/20/2020    Order Specific Question:   ** REASON FOR EXAM (FREE TEXT)    Answer:   Restaging Extensive stage lung cancer    Order Specific Question:   If indicated for the ordered procedure, I authorize the administration of contrast media per Radiology protocol    Answer:   Yes    Order Specific Question:   Preferred imaging location?    Answer:   [facility]    Order Specific Question:   Radiology Contrast Protocol - do NOT remove file path    Answer:   _0 [email or web address]  . CT Abdomen Pelvis W Contrast    Standing Status:   Future    Standing Expiration Date:   12/20/2020    Order Specific Question:   ** REASON FOR EXAM (FREE TEXT)    Answer:   Restaging Extensive Stage Lung Cancer    Order Specific Question:   If indicated for the ordered procedure, I authorize the administration of contrast media per Radiology protocol    Answer:   Yes    Order Specific Question:   Preferred imaging location?     Answer:   [facility]    Order Specific Question:   Is Oral Contrast requested for this exam?    Answer:   Yes, Per Radiology protocol    Order Specific Question:   Radiology Contrast Protocol - do NOT remove file path    Answer:   _1 [email or web address]  . CT Humerus Left W Contrast    Standing Status:   Future    Standing Expiration Date:   12/20/2020    Order Specific Question:   ** REASON FOR EXAM (FREE TEXT)    Answer:   Restaging Lung Cancer, Has hx of bone met to left humerus. Increasing bone pain in left upper arm.    Order Specific Question:   If indicated for the ordered procedure, I authorize the administration of contrast media per Radiology protocol    Answer:  Yes    Order Specific Question:   Preferred imaging location?    Answer:   [facility]    Order Specific Question:   Radiology Contrast Protocol - do NOT remove file path    Answer:   _0 [email or web address]  . TSH    Standing Status:   Standing    Number of Occurrences:   18    Standing Expiration Date:   12/20/2020     Cassandra L Heilingoetter, PA-C 12/21/19  ADDENDUM: Hematology/Oncology Attending: I had a face-to-face encounter with the patient today.  I recommended her care plan.  This is a very pleasant 79 years old white female with extensive stage small cell lung cancer.  She was treated with systemic chemotherapy with carboplatin, Alimta and Imfinzi and she is currently on maintenance treatment with single agent Imfinzi every 3 weeks status post 11 cycles.  The patient has been tolerating her treatment well except for immunotherapy mediated grade 1 skin rash. I recommended for the patient to continue her current treatment as planned.  She will proceed with cycle #12 today. She will come back for follow-up visit in 4 weeks for evaluation with repeat CT scan of the chest, abdomen pelvis for restaging of her disease. For the skin rash, we will  start the patient on hydrocortisone 2.5% cream to the affected area.  If no improvement we may consider the patient for treatment with steroids orally. The patient was advised to call immediately if she has any concerning symptoms in the interval.  Disclaimer: This note was dictated with voice recognition software. Similar sounding words can inadvertently be transcribed and may be missed upon review. Eilleen Kempf, MD 12/21/19

## 2019-12-21 ENCOUNTER — Inpatient Hospital Stay: Payer: Federal, State, Local not specified - PPO

## 2019-12-21 ENCOUNTER — Ambulatory Visit: Payer: Federal, State, Local not specified - PPO

## 2019-12-21 ENCOUNTER — Inpatient Hospital Stay (HOSPITAL_BASED_OUTPATIENT_CLINIC_OR_DEPARTMENT_OTHER): Payer: Federal, State, Local not specified - PPO | Admitting: Physician Assistant

## 2019-12-21 ENCOUNTER — Encounter: Payer: Self-pay | Admitting: Physician Assistant

## 2019-12-21 ENCOUNTER — Inpatient Hospital Stay: Payer: Federal, State, Local not specified - PPO | Attending: Internal Medicine

## 2019-12-21 ENCOUNTER — Other Ambulatory Visit: Payer: Federal, State, Local not specified - PPO

## 2019-12-21 ENCOUNTER — Other Ambulatory Visit: Payer: Self-pay

## 2019-12-21 ENCOUNTER — Ambulatory Visit: Payer: Federal, State, Local not specified - PPO | Admitting: Physician Assistant

## 2019-12-21 ENCOUNTER — Telehealth: Payer: Self-pay | Admitting: Medical Oncology

## 2019-12-21 VITALS — BP 143/58 | HR 68

## 2019-12-21 VITALS — BP 183/72 | HR 76 | Temp 97.8°F | Resp 18 | Ht 62.0 in | Wt 130.4 lb

## 2019-12-21 DIAGNOSIS — Z79899 Other long term (current) drug therapy: Secondary | ICD-10-CM | POA: Diagnosis not present

## 2019-12-21 DIAGNOSIS — R0602 Shortness of breath: Secondary | ICD-10-CM | POA: Insufficient documentation

## 2019-12-21 DIAGNOSIS — C7951 Secondary malignant neoplasm of bone: Secondary | ICD-10-CM

## 2019-12-21 DIAGNOSIS — R21 Rash and other nonspecific skin eruption: Secondary | ICD-10-CM | POA: Diagnosis not present

## 2019-12-21 DIAGNOSIS — C3411 Malignant neoplasm of upper lobe, right bronchus or lung: Secondary | ICD-10-CM

## 2019-12-21 DIAGNOSIS — C7952 Secondary malignant neoplasm of bone marrow: Secondary | ICD-10-CM

## 2019-12-21 DIAGNOSIS — Z5112 Encounter for antineoplastic immunotherapy: Secondary | ICD-10-CM | POA: Insufficient documentation

## 2019-12-21 DIAGNOSIS — C778 Secondary and unspecified malignant neoplasm of lymph nodes of multiple regions: Secondary | ICD-10-CM | POA: Diagnosis not present

## 2019-12-21 DIAGNOSIS — K5900 Constipation, unspecified: Secondary | ICD-10-CM | POA: Insufficient documentation

## 2019-12-21 LAB — CBC WITH DIFFERENTIAL (CANCER CENTER ONLY)
Abs Immature Granulocytes: 0.02 10*3/uL (ref 0.00–0.07)
Basophils Absolute: 0 10*3/uL (ref 0.0–0.1)
Basophils Relative: 0 %
Eosinophils Absolute: 0.2 10*3/uL (ref 0.0–0.5)
Eosinophils Relative: 2 %
HCT: 37.6 % (ref 36.0–46.0)
Hemoglobin: 12.8 g/dL (ref 12.0–15.0)
Immature Granulocytes: 0 %
Lymphocytes Relative: 27 %
Lymphs Abs: 2.2 10*3/uL (ref 0.7–4.0)
MCH: 32.3 pg (ref 26.0–34.0)
MCHC: 34 g/dL (ref 30.0–36.0)
MCV: 94.9 fL (ref 80.0–100.0)
Monocytes Absolute: 0.7 10*3/uL (ref 0.1–1.0)
Monocytes Relative: 9 %
Neutro Abs: 5 10*3/uL (ref 1.7–7.7)
Neutrophils Relative %: 62 %
Platelet Count: 193 10*3/uL (ref 150–400)
RBC: 3.96 MIL/uL (ref 3.87–5.11)
RDW: 12.6 % (ref 11.5–15.5)
WBC Count: 8 10*3/uL (ref 4.0–10.5)
nRBC: 0 % (ref 0.0–0.2)

## 2019-12-21 LAB — CMP (CANCER CENTER ONLY)
ALT: 13 U/L (ref 0–44)
AST: 22 U/L (ref 15–41)
Albumin: 4 g/dL (ref 3.5–5.0)
Alkaline Phosphatase: 67 U/L (ref 38–126)
Anion gap: 11 (ref 5–15)
BUN: 17 mg/dL (ref 8–23)
CO2: 24 mmol/L (ref 22–32)
Calcium: 9 mg/dL (ref 8.9–10.3)
Chloride: 103 mmol/L (ref 98–111)
Creatinine: 0.76 mg/dL (ref 0.44–1.00)
GFR, Est AFR Am: >60 mL/min (ref >60)
GFR, Estimated: >60 mL/min (ref >60)
Glucose, Bld: 94 mg/dL (ref 70–99)
Potassium: 4.4 mmol/L (ref 3.5–5.1)
Sodium: 138 mmol/L (ref 135–145)
Total Bilirubin: 0.3 mg/dL (ref 0.3–1.2)
Total Protein: 7.1 g/dL (ref 6.5–8.1)

## 2019-12-21 LAB — TSH: TSH: 1.473 u[IU]/mL (ref 0.308–3.960)

## 2019-12-21 MED ORDER — HYDROCORTISONE 2.5 % EX CREA: TOPICAL_CREAM | CUTANEOUS | 0 refills | Status: DC | PRN

## 2019-12-21 MED ORDER — SODIUM CHLORIDE 0.9 % IV SOLN
Freq: Once | INTRAVENOUS | Status: AC
  Filled 2019-12-21: qty 250

## 2019-12-21 MED ORDER — SODIUM CHLORIDE 0.9 % IV SOLN
1500.0000 mg | Freq: Once | INTRAVENOUS | Status: AC
  Administered 2019-12-21: 1500 mg via INTRAVENOUS
  Filled 2019-12-21: qty 30

## 2019-12-21 MED ORDER — HEPARIN SOD (PORK) LOCK FLUSH 100 UNIT/ML IV SOLN
500.0000 [IU] | Freq: Once | INTRAVENOUS | Status: AC | PRN
  Administered 2019-12-21: 500 [IU]
  Filled 2019-12-21: qty 5

## 2019-12-21 MED ORDER — SODIUM CHLORIDE 0.9% FLUSH
10.0000 mL | INTRAVENOUS | Status: DC | PRN
  Administered 2019-12-21: 10 mL
  Filled 2019-12-21: qty 10

## 2019-12-21 NOTE — Patient Instructions (Signed)
[facility] Discharge Instructions for Patients Receiving Chemotherapy  Today you received the following chemotherapy agents Durvalumab (IMFINZI).  To help prevent nausea and vomiting after your treatment, we encourage you to take your nausea medication as prescribed.   If you develop nausea and vomiting that is not controlled by your nausea medication, call the clinic.   BELOW ARE SYMPTOMS THAT SHOULD BE REPORTED IMMEDIATELY:  *FEVER GREATER THAN 100.5 F  *CHILLS WITH OR WITHOUT FEVER  NAUSEA AND VOMITING THAT IS NOT CONTROLLED WITH YOUR NAUSEA MEDICATION  *UNUSUAL SHORTNESS OF BREATH  *UNUSUAL BRUISING OR BLEEDING  TENDERNESS IN MOUTH AND THROAT WITH OR WITHOUT PRESENCE OF ULCERS  *URINARY PROBLEMS  *BOWEL PROBLEMS  UNUSUAL RASH Items with * indicate a potential emergency and should be followed up as soon as possible.  Feel free to call the clinic should you have any questions or concerns. The clinic phone number is [phone].  Please show the [facility] at check-in to the Emergency Department and triage nurse.  Coronavirus (COVID-19) Are you at risk?  Are you at risk for the Coronavirus (COVID-19)?  To be considered HIGH RISK for Coronavirus (COVID-19), you have to meet the following criteria:  . Traveled to [place], [place], [place], [place] or [place]; or in the [place] to [place], [place], [place], or [place]; and have fever, cough, and shortness of breath within the last 2 weeks of travel OR . Been in close contact with a person diagnosed with COVID-19 within the last 2 weeks and have fever, cough, and shortness of breath . IF YOU DO NOT MEET THESE CRITERIA, YOU ARE CONSIDERED LOW RISK FOR COVID-19.  What to do if you are HIGH RISK for COVID-19?  Marland Kitchen If you are having a medical emergency, call 911. . Seek medical care right away. Before you go to a doctor's office, urgent care or emergency department, call ahead and tell them  about your recent travel, contact with someone diagnosed with COVID-19, and your symptoms. You should receive instructions from your physician's office regarding next steps of care.  . When you arrive at healthcare provider, tell the healthcare staff immediately you have returned from visiting [place], [place], [place], [place] or [place]; or traveled in the [place] to [place], [place], [place], or [place]; in the last two weeks or you have been in close contact with a person diagnosed with COVID-19 in the last 2 weeks.   . Tell the health care staff about your symptoms: fever, cough and shortness of breath. . After you have been seen by a medical provider, you will be either: o Tested for (COVID-19) and discharged home on quarantine except to seek medical care if symptoms worsen, and asked to  - Stay home and avoid contact with others until you get your results (4-5 days)  - Avoid travel on public transportation if possible (such as bus, train, or airplane) or o Sent to the Emergency Department by EMS for evaluation, COVID-19 testing, and possible admission depending on your condition and test results.  What to do if you are LOW RISK for COVID-19?  Reduce your risk of any infection by using the same precautions used for avoiding the common cold or flu:  Marland Kitchen Wash your hands often with soap and warm water for at least 20 seconds.  If soap and water are not readily available, use an alcohol-based hand sanitizer with at least 60% alcohol.  . If coughing or  sneezing, cover your mouth and nose by coughing or sneezing into the elbow areas of your shirt or coat, into a tissue or into your sleeve (not your hands). . Avoid shaking hands with others and consider head nods or verbal greetings only. . Avoid touching your eyes, nose, or mouth with unwashed hands.  . Avoid close contact with people who are sick. . Avoid places or events with large numbers of people in one location, like concerts or  sporting events. . Carefully consider travel plans you have or are making. . If you are planning any travel outside or inside the [place], visit the CDC's Travelers' Health webpage for the latest health notices. . If you have some symptoms but not all symptoms, continue to monitor at home and seek medical attention if your symptoms worsen. . If you are having a medical emergency, call 911.   [place] / e-Visit: [email or web address]         [facility]: [facility]: [phone]                   [facility]: [phone]

## 2019-12-21 NOTE — Telephone Encounter (Signed)
requests lab appt day before CT scan.Schedule message sent.

## 2019-12-22 ENCOUNTER — Telehealth: Payer: Self-pay | Admitting: Internal Medicine

## 2019-12-22 NOTE — Telephone Encounter (Signed)
Scheduled appt per 3/15 sch message - pt is aware of appt date and time

## 2019-12-23 ENCOUNTER — Ambulatory Visit: Payer: Federal, State, Local not specified - PPO | Attending: Internal Medicine

## 2019-12-23 DIAGNOSIS — Z23 Encounter for immunization: Secondary | ICD-10-CM

## 2019-12-23 NOTE — Progress Notes (Signed)
   Covid-19 Vaccination Clinic  Name:  Brittany Marks    MRN: [id number] DOB: 05-22-1941  12/23/2019  [name] was observed post Covid-19 immunization for 15 minutes without incident. She was provided with Vaccine Information Sheet and instruction to access the V-Safe system.   [name] was instructed to call 911 with any severe reactions post vaccine: Marland Kitchen Difficulty breathing  . Swelling of face and throat  . A fast heartbeat  . A bad rash all over body  . Dizziness and weakness   Immunizations Administered    Name Date Dose VIS Date Route   Pfizer COVID-19 Vaccine 12/23/2019  2:04 PM 0.3 mL 09/18/2019 Intramuscular   Manufacturer: [employer]   Lot: [id number]   [place]: [id number]

## 2020-01-07 ENCOUNTER — Telehealth (HOSPITAL_COMMUNITY): Payer: Self-pay | Admitting: Physician Assistant

## 2020-01-07 NOTE — Telephone Encounter (Signed)
01/07/20~Cancel per patient request. DO NOT [facility], per patient request. MF

## 2020-01-11 ENCOUNTER — Telehealth: Payer: Self-pay | Admitting: Medical Oncology

## 2020-01-11 NOTE — Telephone Encounter (Signed)
-----   Message from Curt Bears, MD sent at 01/07/2020  4:11 PM EDT ----- Regarding: FW: just fyi Fyi ----- Message ----- From: Nicholaus Corolla Sent: 01/07/2020   2:31 PM EDT To: Curt Bears, MD Subject: just fyi                                       Scheduling Message Entered by Guy Sandifer on 01/07/2020 at 12:07 PM Priority: High <No visit type provided> Department: [facility] Provider: [facility] Scheduling Notes: cancel appts on 01/13/20  Pt cancelled all appts for April - says she cant afford to keep coming

## 2020-01-11 NOTE — Telephone Encounter (Signed)
F/U appt - LVM for pt to return my call to talk about future appts including  port flushes.

## 2020-01-13 ENCOUNTER — Inpatient Hospital Stay: Payer: Federal, State, Local not specified - PPO

## 2020-01-13 ENCOUNTER — Telehealth: Payer: Self-pay | Admitting: *Deleted

## 2020-01-13 ENCOUNTER — Encounter: Payer: Self-pay | Admitting: Internal Medicine

## 2020-01-13 ENCOUNTER — Telehealth: Payer: Self-pay | Admitting: Medical Oncology

## 2020-01-13 NOTE — Telephone Encounter (Signed)
Spoke with patient.  She was able to find out about a grant to assist with treatments.  She wanted to reschedule the visits for 01/18/2020 that she canceled because of financial concerns.  Scheduling message sent.    She will call centralized scheduling to reschedule CT scans herself.

## 2020-01-13 NOTE — Telephone Encounter (Signed)
Financial-" I cannot afford $3700 every month for tx."  Last Imfini/port flush was in  march  . Pt will call Lenise then call back about setting up port flushes if she wants.

## 2020-01-13 NOTE — Progress Notes (Signed)
Pt called w/ financial concerns.  I reminded pt of the copay assistance grant she has w/ the [employer] that covers [place].  She doesn't recall so I offered to mail her a copy of her approval letter to keep for her records.  She was relieved to hear about the grant so she will call back to reschedule her appointments.

## 2020-01-14 ENCOUNTER — Ambulatory Visit (HOSPITAL_COMMUNITY): Payer: Federal, State, Local not specified - PPO

## 2020-01-14 ENCOUNTER — Other Ambulatory Visit (HOSPITAL_COMMUNITY): Payer: Federal, State, Local not specified - PPO

## 2020-01-15 ENCOUNTER — Telehealth: Payer: Self-pay | Admitting: Internal Medicine

## 2020-01-15 NOTE — Telephone Encounter (Signed)
Scheduled apt per 4/9 sch message - unable to reach pt - left message with appt date and time

## 2020-01-18 ENCOUNTER — Other Ambulatory Visit: Payer: Federal, State, Local not specified - PPO

## 2020-01-18 ENCOUNTER — Ambulatory Visit: Payer: Federal, State, Local not specified - PPO

## 2020-01-18 ENCOUNTER — Ambulatory Visit: Payer: Federal, State, Local not specified - PPO | Admitting: Internal Medicine

## 2020-01-18 ENCOUNTER — Telehealth: Payer: Self-pay | Admitting: Medical Oncology

## 2020-01-18 NOTE — Telephone Encounter (Signed)
appts confirmed with pt.

## 2020-01-19 ENCOUNTER — Telehealth: Payer: Self-pay | Admitting: Internal Medicine

## 2020-01-19 NOTE — Progress Notes (Signed)

## 2020-01-19 NOTE — Telephone Encounter (Signed)
Called pt per 4/12 sch message - pt is aware of appts on 4/19 .

## 2020-01-20 ENCOUNTER — Encounter (HOSPITAL_COMMUNITY): Payer: Self-pay

## 2020-01-20 ENCOUNTER — Ambulatory Visit (HOSPITAL_COMMUNITY): Payer: Federal, State, Local not specified - PPO

## 2020-01-20 ENCOUNTER — Ambulatory Visit (HOSPITAL_COMMUNITY)
Admission: RE | Admit: 2020-01-20 | Discharge: 2020-01-20 | Disposition: A | Discharge status: Home / Self Care | Payer: Federal, State, Local not specified - PPO | Source: Ambulatory Visit | Attending: Physician Assistant | Admitting: Physician Assistant

## 2020-01-20 ENCOUNTER — Other Ambulatory Visit: Payer: Federal, State, Local not specified - PPO

## 2020-01-20 ENCOUNTER — Other Ambulatory Visit: Payer: Self-pay

## 2020-01-20 DIAGNOSIS — C3411 Malignant neoplasm of upper lobe, right bronchus or lung: Secondary | ICD-10-CM | POA: Diagnosis not present

## 2020-01-20 MED ORDER — SODIUM CHLORIDE (PF) 0.9 % IJ SOLN
INTRAMUSCULAR | Status: AC
  Filled 2020-01-20: qty 50

## 2020-01-20 MED ORDER — HEPARIN SOD (PORK) LOCK FLUSH 100 UNIT/ML IV SOLN
500.0000 [IU] | Freq: Once | INTRAVENOUS | Status: AC
  Administered 2020-01-20: 500 [IU] via INTRAVENOUS

## 2020-01-20 MED ORDER — HEPARIN SOD (PORK) LOCK FLUSH 100 UNIT/ML IV SOLN
INTRAVENOUS | Status: AC
  Filled 2020-01-20: qty 5

## 2020-01-20 MED ORDER — IOHEXOL 300 MG/ML  SOLN
100.0000 mL | Freq: Once | INTRAMUSCULAR | Status: AC | PRN
  Administered 2020-01-20: 100 mL via INTRAVENOUS

## 2020-01-25 ENCOUNTER — Inpatient Hospital Stay: Payer: Federal, State, Local not specified - PPO | Attending: Oncology | Admitting: Internal Medicine

## 2020-01-25 ENCOUNTER — Inpatient Hospital Stay: Payer: Federal, State, Local not specified - PPO

## 2020-01-25 ENCOUNTER — Other Ambulatory Visit: Payer: Self-pay

## 2020-01-25 ENCOUNTER — Encounter: Payer: Self-pay | Admitting: Internal Medicine

## 2020-01-25 VITALS — BP 139/87 | HR 71 | Temp 98.3°F | Resp 17 | Ht 62.0 in | Wt 131.2 lb

## 2020-01-25 DIAGNOSIS — C3411 Malignant neoplasm of upper lobe, right bronchus or lung: Secondary | ICD-10-CM | POA: Insufficient documentation

## 2020-01-25 DIAGNOSIS — I700 Atherosclerosis of aorta: Secondary | ICD-10-CM | POA: Insufficient documentation

## 2020-01-25 DIAGNOSIS — R937 Abnormal findings on diagnostic imaging of other parts of musculoskeletal system: Secondary | ICD-10-CM

## 2020-01-25 DIAGNOSIS — Z95828 Presence of other vascular implants and grafts: Secondary | ICD-10-CM

## 2020-01-25 DIAGNOSIS — K5900 Constipation, unspecified: Secondary | ICD-10-CM | POA: Insufficient documentation

## 2020-01-25 DIAGNOSIS — Z79899 Other long term (current) drug therapy: Secondary | ICD-10-CM | POA: Diagnosis not present

## 2020-01-25 DIAGNOSIS — C7952 Secondary malignant neoplasm of bone marrow: Secondary | ICD-10-CM

## 2020-01-25 DIAGNOSIS — Z5112 Encounter for antineoplastic immunotherapy: Secondary | ICD-10-CM | POA: Diagnosis not present

## 2020-01-25 DIAGNOSIS — C7951 Secondary malignant neoplasm of bone: Secondary | ICD-10-CM

## 2020-01-25 DIAGNOSIS — J439 Emphysema, unspecified: Secondary | ICD-10-CM | POA: Insufficient documentation

## 2020-01-25 DIAGNOSIS — Z1211 Encounter for screening for malignant neoplasm of colon: Secondary | ICD-10-CM

## 2020-01-25 LAB — COMPREHENSIVE METABOLIC PANEL
ALT: 20 U/L (ref 0–44)
AST: 22 U/L (ref 15–41)
Albumin: 4 g/dL (ref 3.5–5.0)
Alkaline Phosphatase: 72 U/L (ref 38–126)
Anion gap: 11 (ref 5–15)
BUN: 22 mg/dL (ref 8–23)
CO2: 24 mmol/L (ref 22–32)
Calcium: 9.1 mg/dL (ref 8.9–10.3)
Chloride: 103 mmol/L (ref 98–111)
Creatinine, Ser: 0.78 mg/dL (ref 0.44–1.00)
GFR calc Af Amer: >60 mL/min (ref >60)
GFR calc non Af Amer: >60 mL/min (ref >60)
Glucose, Bld: 93 mg/dL (ref 70–99)
Potassium: 4.5 mmol/L (ref 3.5–5.1)
Sodium: 138 mmol/L (ref 135–145)
Total Bilirubin: 0.3 mg/dL (ref 0.3–1.2)
Total Protein: 7 g/dL (ref 6.5–8.1)

## 2020-01-25 LAB — CBC WITH DIFFERENTIAL/PLATELET
Abs Immature Granulocytes: 0.02 10*3/uL (ref 0.00–0.07)
Basophils Absolute: 0 10*3/uL (ref 0.0–0.1)
Basophils Relative: 0 %
Eosinophils Absolute: 0.2 10*3/uL (ref 0.0–0.5)
Eosinophils Relative: 3 %
HCT: 36.8 % (ref 36.0–46.0)
Hemoglobin: 12.3 g/dL (ref 12.0–15.0)
Immature Granulocytes: 0 %
Lymphocytes Relative: 30 %
Lymphs Abs: 2.1 10*3/uL (ref 0.7–4.0)
MCH: 31.8 pg (ref 26.0–34.0)
MCHC: 33.4 g/dL (ref 30.0–36.0)
MCV: 95.1 fL (ref 80.0–100.0)
Monocytes Absolute: 0.7 10*3/uL (ref 0.1–1.0)
Monocytes Relative: 10 %
Neutro Abs: 4 10*3/uL (ref 1.7–7.7)
Neutrophils Relative %: 57 %
Platelets: 205 10*3/uL (ref 150–400)
RBC: 3.87 MIL/uL (ref 3.87–5.11)
RDW: 12.3 % (ref 11.5–15.5)
WBC: 7.1 10*3/uL (ref 4.0–10.5)
nRBC: 0 % (ref 0.0–0.2)

## 2020-01-25 LAB — TSH: TSH: 2.772 u[IU]/mL (ref 0.308–3.960)

## 2020-01-25 MED ORDER — SODIUM CHLORIDE 0.9 % IV SOLN
Freq: Once | INTRAVENOUS | Status: AC
  Filled 2020-01-25: qty 250

## 2020-01-25 MED ORDER — SODIUM CHLORIDE 0.9 % IV SOLN
1500.0000 mg | Freq: Once | INTRAVENOUS | Status: AC
  Administered 2020-01-25: 1500 mg via INTRAVENOUS
  Filled 2020-01-25: qty 30

## 2020-01-25 MED ORDER — HEPARIN SOD (PORK) LOCK FLUSH 100 UNIT/ML IV SOLN
500.0000 [IU] | Freq: Once | INTRAVENOUS | Status: AC | PRN
  Administered 2020-01-25: 500 [IU]
  Filled 2020-01-25: qty 5

## 2020-01-25 MED ORDER — SODIUM CHLORIDE 0.9% FLUSH
10.0000 mL | INTRAVENOUS | Status: DC | PRN
  Administered 2020-01-25: 10 mL
  Filled 2020-01-25: qty 10

## 2020-01-25 NOTE — Patient Instructions (Signed)
[facility] Discharge Instructions for Patients Receiving Chemotherapy  Today you received the following chemotherapy agents: durvalumab.  To help prevent nausea and vomiting after your treatment, we encourage you to take your nausea medication as directed.   If you develop nausea and vomiting that is not controlled by your nausea medication, call the clinic.   BELOW ARE SYMPTOMS THAT SHOULD BE REPORTED IMMEDIATELY:  *FEVER GREATER THAN 100.5 F  *CHILLS WITH OR WITHOUT FEVER  NAUSEA AND VOMITING THAT IS NOT CONTROLLED WITH YOUR NAUSEA MEDICATION  *UNUSUAL SHORTNESS OF BREATH  *UNUSUAL BRUISING OR BLEEDING  TENDERNESS IN MOUTH AND THROAT WITH OR WITHOUT PRESENCE OF ULCERS  *URINARY PROBLEMS  *BOWEL PROBLEMS  UNUSUAL RASH Items with * indicate a potential emergency and should be followed up as soon as possible.  Feel free to call the clinic should you have any questions or concerns. The clinic phone number is [phone].  Please show the CHEMO ALERT CARD at check-in to the Emergency Department and triage nurse.   

## 2020-01-25 NOTE — Progress Notes (Signed)
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  PRIOR THERAPY: None  CURRENT THERAPY: Palliative systemic chemotherapy with carboplatin for AUC of 5 on day 1, etoposide 100 mg/M2 on days 1, 2 and 3 as well as Imfinzi 1500 mg every 3 weeks with the chemotherapy and Neulasta support. First dose on 03/16/2019. Status post 12 cycles.   Starting from cycle #5 the patient will be treated with maintenance treatment with Imfinzi 1500 mg IV every 4 weeks.  INTERVAL HISTORY: Brittany Marks 79 y.o. female returns to the clinic today for follow-up visit.  The patient is feeling fine today with no concerning complaints except for mild fatigue and pain in the left arm.  She was planning to discontinue her treatment because of financial concerns but the patient was assured by the financial office that she is currently on a grant for her treatment.  She denied having any current chest pain, shortness of breath, cough or hemoptysis.  She denied having any fever or chills.  She denied having any recent weight loss or night sweats.  She has no headache or visual changes.  She continues to tolerate her treatment with Imfinzi fairly well.  She is here today for evaluation with repeat CT scan of the chest, abdomen pelvis as well as scan of the left arm for restaging of her disease.   MEDICAL HISTORY: Past Medical History:  Diagnosis Date  . Cataract    Bilateral  . Constipation    pain medication  . Left rotator cuff tear   . Right rotator cuff tear   . SCL CA dx'd 11/2018    ALLERGIES:  is allergic to  diclofenac.  MEDICATIONS:  Current Outpatient Medications  Medication Sig Dispense Refill  . b complex vitamins tablet Take 1 tablet by mouth daily.    . COD LIVER OIL PO Take 1 tablet by mouth daily.     . diphenhydrAMINE HCl (BENADRYL ALLERGY PO) Take 1 tablet by mouth daily.    . hydrocortisone 2.5 % cream Apply topically as needed. 3.5 g 0  . Multiple Vitamin (MULTIVITAMIN) tablet Take 1 tablet by mouth daily.    . sodium chloride (OCEAN) 0.65 % nasal spray Place 1 spray into the nose 2 (two) times daily as needed for congestion.    . vitamin C (ASCORBIC ACID) 500 MG tablet Take 500 mg by mouth daily.    . vitamin E 400 UNIT capsule Take 400 Units by mouth daily.    Marland Kitchen oxymetazoline (AFRIN) 0.05 % nasal spray Place 2 sprays into the nose 2 (two) times daily as needed for congestion.    Vladimir Faster Glycol-Propyl Glycol (SYSTANE) 0.4-0.3 % SOLN Apply 1 drop to eye 2 (two) times daily as needed (dry eyes).    . prochlorperazine (COMPAZINE) 10 MG tablet Take 1 tablet (10 mg total) by mouth every 6 (six) hours as needed for nausea or vomiting. (Patient not taking: Reported on 01/25/2020) 30 tablet 0   No current facility-administered medications for this visit.   Facility-Administered Medications Ordered in Other Visits  Medication Dose Route Frequency Provider Last Rate Last Admin  . heparin lock  flush 100 unit/mL  500 Units Intracatheter Once PRN Magrinat, Virgie Dad, MD      . sodium chloride flush (NS) 0.9 % injection 10 mL  10 mL Intracatheter PRN Magrinat, Virgie Dad, MD        SURGICAL HISTORY:  Past Surgical History:  Procedure Laterality Date  . CATARACT EXTRACTION W/ INTRAOCULAR LENS  IMPLANT, BILATERAL  2012  . DECOMPRESSIVE LUMBAR LAMINECTOMY LEVEL 2 N/A 07/29/2013   Procedure: LUMBAR LAMINECTOMY, DECOMPRESSION LUMBAR THREE TO FOUR, FOUR TO FIVE microdiscectomy l3,4 right;  Surgeon: Tobi Bastos, MD;  Location: [facility];  Service: Orthopedics;  Laterality: N/A;  . I & D SUPERIOR  RIGHT SHOULDER AND CLOSURE WOUND  01-10-2011   S/P ROTATOR CUFF REPAIR  . IR IMAGING GUIDED PORT INSERTION  04/09/2019  . LUMBAR LAMINECTOMY  1970'S  . LUMBAR LAMINECTOMY/DECOMPRESSION MICRODISCECTOMY N/A 06/27/2016   Procedure: L1 - L2 DISCECTOMY;  Surgeon: Melina Schools, MD;  Location: [facility];  Service: Orthopedics;  Laterality: N/A;  . [place]  . RIGHT SHOULDER ARTHROSCOPY/ OPEN DISTAL CLAVICLE RESECTION/ SAD/ OPEN ROTATOR CUFF REPAIR  11-28-2010  . SHOULDER OPEN ROTATOR CUFF REPAIR  12/19/2011   Procedure: ROTATOR CUFF REPAIR SHOULDER OPEN;  Surgeon: Magnus Sinning, MD;  Location: [facility];  Service: Orthopedics;  Laterality: Right;  RIGHT RECURRENT OPEN REPAIR OF THE ROTATOR CUFF WITH TISSUE MEND GRAFTANTERIOR CHROMIOECTOMY  . VAGINAL HYSTERECTOMY  1979    REVIEW OF SYSTEMS:  Constitutional: positive for fatigue Eyes: negative Ears, nose, mouth, throat, and face: negative Respiratory: negative Cardiovascular: negative Gastrointestinal: negative Genitourinary:negative Integument/breast: negative Hematologic/lymphatic: negative Musculoskeletal:positive for bone pain Neurological: negative Behavioral/Psych: negative Endocrine: negative Allergic/Immunologic: negative   PHYSICAL EXAMINATION: General appearance: alert, cooperative, fatigued and no distress Head: Normocephalic, without obvious abnormality, atraumatic Neck: no adenopathy, no JVD, supple, symmetrical, trachea midline and thyroid not enlarged, symmetric, no tenderness/mass/nodules Lymph nodes: Cervical, supraclavicular, and axillary nodes normal. Resp: clear to auscultation bilaterally Back: symmetric, no curvature. ROM normal. No CVA tenderness. Cardio: regular rate and rhythm, S1, S2 normal, no murmur, click, rub or gallop GI: soft, non-tender; bowel sounds normal; no masses,  no organomegaly Extremities: extremities normal, atraumatic, no cyanosis or edema Neurologic: Alert and  oriented X 3, normal strength and tone. Normal symmetric reflexes. Normal coordination and gait   ECOG PERFORMANCE STATUS: 1 - Symptomatic but completely ambulatory  Blood pressure 139/87, pulse 71, temperature 98.3 F (36.8 C), temperature source Temporal, resp. rate 17, height 5\' 2"  (1.575 m), weight 131 lb 3.2 oz (59.5 kg), SpO2 99 %.  LABORATORY DATA: Lab Results  Component Value Date   WBC 7.1 01/25/2020   HGB 12.3 01/25/2020   HCT 36.8 01/25/2020   MCV 95.1 01/25/2020   PLT 205 01/25/2020      Chemistry      Component Value Date/Time   NA 138 12/21/2019 1100   K 4.4 12/21/2019 1100   CL 103 12/21/2019 1100   CO2 24 12/21/2019 1100   BUN 17 12/21/2019 1100   CREATININE 0.76 12/21/2019 1100      Component Value Date/Time   CALCIUM 9.0 12/21/2019 1100   ALKPHOS 67 12/21/2019 1100   AST 22 12/21/2019 1100   ALT 13 12/21/2019 1100   BILITOT 0.3 12/21/2019 1100       RADIOGRAPHIC STUDIES: CT Chest W Contrast  Result Date: 01/21/2020 CLINICAL DATA:  Restaging small cell lung cancer. EXAM: CT CHEST, ABDOMEN, AND PELVIS WITH CONTRAST TECHNIQUE: Multidetector CT imaging  of the chest, abdomen and pelvis was performed following the standard protocol during bolus administration of intravenous contrast. CONTRAST:  145mL OMNIPAQUE IOHEXOL 300 MG/ML  SOLN COMPARISON:  10/23/2019 FINDINGS: CT CHEST FINDINGS Cardiovascular: Mild cardiac enlargement. Aortic atherosclerosis. Three vessel coronary artery atherosclerotic calcification. No pericardial effusion. Mediastinum/Nodes: Normal appearance of the thyroid gland. The trachea appears patent and is midline. Normal appearance of the esophagus. Low right paratracheal lymph node measures 1 cm.  Previously 0.8 cm. Lungs/Pleura: No pleural effusion identified. Centrilobular and paraseptal emphysema. Bilateral peripheral interstitial reticulation and traction bronchiectasis is noted with a lower lung zone gradient. Within the posterolateral left  lung base there is a small enhancing subpleural nodule which is best seen on the soft tissue sequence measuring 0.9 cm, image 41/2. Also this can be seen on image 159/6 (sagittal series). Unchanged from previous exam. Musculoskeletal: There are marked degenerative changes involving the right glenohumeral joint. Fracture deformity involving the proximal left humerus is again noted. This is only partially visualized on chest CT. A dedicated, contrast enhanced CT of the left humerus was also performed today. See report from that exam for further details.Extensive degenerative disc disease noted within the thoracic spine. CT ABDOMEN PELVIS FINDINGS Hepatobiliary: No focal liver abnormality is seen. The index lesion within the porta hepatis measures 2.4 cm, image 51/2. Previously 3.2 cm. No gallstones, gallbladder wall thickening, or biliary dilatation. Pancreas: Unremarkable. No pancreatic ductal dilatation or surrounding inflammatory changes. Spleen: Normal in size without focal abnormality. Adrenals/Urinary Tract: Normal appearance of the adrenal glands. The left kidney is normal. Large right extrarenal pelvis, unchanged. No hydronephrosis. The urinary bladder appears unremarkable. Stomach/Bowel: Stomach is within normal limits. Appendix appears normal. No evidence of bowel wall thickening, distention, or inflammatory changes. Vascular/Lymphatic: Aortic atherosclerosis. No aneurysm. No abdominopelvic adenopathy. Reproductive: Status post hysterectomy. No adnexal masses. Other: No free fluid or fluid collections. Musculoskeletal: Marked multi level lumbar degenerative disc disease. IMPRESSION: 1. Low right paratracheal lymph node has increased in size when compared with the previous exam. Currently this measures 1 cm versus 0.8 cm previously. 2. Index lesion within the porta hepatic region demonstrates mild decrease in size in the interval. 3. Emphysema with superimposed changes of chronic interstitial lung disease. 4.  Left main and 3 vessel coronary artery atherosclerotic calcifications. Aortic Atherosclerosis (ICD10-I70.0) and Emphysema (ICD10-J43.9). Electronically Signed   By: Kerby Moors M.D.   On: 01/21/2020 10:34   CT Humerus Left W Contrast  Result Date: 01/21/2020 CLINICAL DATA:  Left arm pain. History of metastatic lung cancer EXAM: CT OF THE UPPER LEFT EXTREMITY WITH CONTRAST TECHNIQUE: Multidetector CT imaging of the upper left extremity was performed according to the standard protocol following intravenous contrast administration. CONTRAST:  135mL OMNIPAQUE IOHEXOL 300 MG/ML  SOLN COMPARISON:  Chest x-ray 12/26/2018, CT 01/01/2019 FINDINGS: Redemonstration of a chronic pathologic fracture of the proximal left humeral metadiaphysis with approximately 9 mm of posterior displacement (series 508, image 29). Fracture margins are irregular with slight bony expansion at the proximal fracture component compatible with known lytic osseous metastatic lesion at this site. No bridging callus formation across the fracture site. There is a large low attenuation heterogeneously enhancing soft tissue mass with some peripheral rim calcification measuring approximately 5.4 x 4.0 x 2.3 cm (series 503, image 39; series 506, image 26) centered at the fracture site and extending into the adjacent upper extremity musculature. There may be some internal fluid components to this mass. Glenohumeral joint is intact without fracture or dislocation. Severe glenohumeral osteoarthritis.  High-riding humeral head articulating with the undersurface of the acromion compatible with underlying rotator cuff tear. Atrophy and fatty infiltration are noted within the supraspinatus, infraspinatus, and subscapularis muscle bellies, compatible with chronic tear. Mild arthropathy of the [facility] joint. No additional lytic or sclerotic bone lesions are seen. Scattered mildly prominent left axillary lymph nodes, nonspecific. IMPRESSION: 1. Redemonstration of a  chronic ununited pathologic fracture of the proximal left humeral metadiaphysis with approximately 9 mm of posterior displacement. Fracture margins are irregular with slight bony expansion at the proximal fracture component compatible with known lytic osseous metastatic lesion at this site. 2. Large heterogeneously enhancing soft tissue mass centered at the fracture site measuring up to 5.4 cm, compatible with metastatic lesion. 3. Chronic left rotator cuff tears with associated muscle atrophy and fatty infiltration. 4. Severe glenohumeral osteoarthritis. 5. Scattered mildly prominent left axillary lymph nodes, nonspecific. Electronically Signed   By: Davina Poke D.O.   On: 01/21/2020 10:42   CT Abdomen Pelvis W Contrast  Result Date: 01/21/2020 CLINICAL DATA:  Restaging small cell lung cancer. EXAM: CT CHEST, ABDOMEN, AND PELVIS WITH CONTRAST TECHNIQUE: Multidetector CT imaging of the chest, abdomen and pelvis was performed following the standard protocol during bolus administration of intravenous contrast. CONTRAST:  111mL OMNIPAQUE IOHEXOL 300 MG/ML  SOLN COMPARISON:  10/23/2019 FINDINGS: CT CHEST FINDINGS Cardiovascular: Mild cardiac enlargement. Aortic atherosclerosis. Three vessel coronary artery atherosclerotic calcification. No pericardial effusion. Mediastinum/Nodes: Normal appearance of the thyroid gland. The trachea appears patent and is midline. Normal appearance of the esophagus. Low right paratracheal lymph node measures 1 cm.  Previously 0.8 cm. Lungs/Pleura: No pleural effusion identified. Centrilobular and paraseptal emphysema. Bilateral peripheral interstitial reticulation and traction bronchiectasis is noted with a lower lung zone gradient. Within the posterolateral left lung base there is a small enhancing subpleural nodule which is best seen on the soft tissue sequence measuring 0.9 cm, image 41/2. Also this can be seen on image 159/6 (sagittal series). Unchanged from previous exam.  Musculoskeletal: There are marked degenerative changes involving the right glenohumeral joint. Fracture deformity involving the proximal left humerus is again noted. This is only partially visualized on chest CT. A dedicated, contrast enhanced CT of the left humerus was also performed today. See report from that exam for further details.Extensive degenerative disc disease noted within the thoracic spine. CT ABDOMEN PELVIS FINDINGS Hepatobiliary: No focal liver abnormality is seen. The index lesion within the porta hepatis measures 2.4 cm, image 51/2. Previously 3.2 cm. No gallstones, gallbladder wall thickening, or biliary dilatation. Pancreas: Unremarkable. No pancreatic ductal dilatation or surrounding inflammatory changes. Spleen: Normal in size without focal abnormality. Adrenals/Urinary Tract: Normal appearance of the adrenal glands. The left kidney is normal. Large right extrarenal pelvis, unchanged. No hydronephrosis. The urinary bladder appears unremarkable. Stomach/Bowel: Stomach is within normal limits. Appendix appears normal. No evidence of bowel wall thickening, distention, or inflammatory changes. Vascular/Lymphatic: Aortic atherosclerosis. No aneurysm. No abdominopelvic adenopathy. Reproductive: Status post hysterectomy. No adnexal masses. Other: No free fluid or fluid collections. Musculoskeletal: Marked multi level lumbar degenerative disc disease. IMPRESSION: 1. Low right paratracheal lymph node has increased in size when compared with the previous exam. Currently this measures 1 cm versus 0.8 cm previously. 2. Index lesion within the porta hepatic region demonstrates mild decrease in size in the interval. 3. Emphysema with superimposed changes of chronic interstitial lung disease. 4. Left main and 3 vessel coronary artery atherosclerotic calcifications. Aortic Atherosclerosis (ICD10-I70.0) and Emphysema (ICD10-J43.9). Electronically Signed   By: Kerby Moors  M.D.   On: 01/21/2020 10:34     ASSESSMENT AND PLAN: This is a very pleasant 79 years old white female with extensive stage small cell lung cancer and she is currently undergoing treatment with carboplatin, etoposide and Imfinzi status post 5 cycles.  Starting from cycle #5 the patient is on maintenance treatment with single agent Imfinzi every 4 weeks status post 8 cycles. She has been tolerating her maintenance treatment with Imfinzi fairly well with no concerning adverse effects. The patient had repeat CT scan of the chest, abdomen pelvis performed recently.  I personally and independently reviewed the scans and discussed the results with the patient today. Her scan showed no concerning findings for disease progression.  CT scan of the left humerus showed metastatic disease in the proximal left humerus. I recommended for the patient to continue her current maintenance treatment with Imfinzi every 4 weeks and she will proceed with cycle #9 today. For the left humeral bone metastasis, I will refer the patient back to Dr. Lisbeth Renshaw for consideration of palliative radiotherapy to this area. The patient will come back for follow-up visit in 4 weeks for evaluation before the next cycle of her treatment. She was advised to call immediately if she has any concerning symptoms in the interval. The patient voices understanding of current disease status and treatment options and is in agreement with the current care plan.  All questions were answered. The patient knows to call the clinic with any problems, questions or concerns. We can certainly see the patient much sooner if necessary.  Disclaimer: This note was dictated with voice recognition software. Similar sounding words can inadvertently be transcribed and may not be corrected upon review.

## 2020-01-27 ENCOUNTER — Ambulatory Visit: Payer: Federal, State, Local not specified - PPO | Admitting: Radiation Oncology

## 2020-01-27 ENCOUNTER — Other Ambulatory Visit: Payer: Self-pay

## 2020-01-27 ENCOUNTER — Ambulatory Visit
Admission: RE | Admit: 2020-01-27 | Discharge: 2020-01-27 | Disposition: A | Discharge status: Home / Self Care | Payer: Federal, State, Local not specified - PPO | Source: Ambulatory Visit | Attending: Radiation Oncology | Admitting: Radiation Oncology

## 2020-01-27 ENCOUNTER — Encounter: Payer: Self-pay | Admitting: Radiation Oncology

## 2020-01-27 VITALS — BP 145/60 | HR 78 | Temp 98.5°F | Resp 20 | Ht 62.0 in | Wt 132.8 lb

## 2020-01-27 DIAGNOSIS — C3411 Malignant neoplasm of upper lobe, right bronchus or lung: Secondary | ICD-10-CM

## 2020-01-27 DIAGNOSIS — C7951 Secondary malignant neoplasm of bone: Secondary | ICD-10-CM

## 2020-01-27 NOTE — Progress Notes (Signed)
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  Interval Since Last Radiation: 9 month  04/02/2019-04/16/2019:  30 Gy in 10 fractions was given to the left proximal humerus.   Narrative: In summary this is a pleasant 79 year old patient with a story of extensive stage small cell cancer originating in the right upper lobe who also had bone metastases and was treated with palliative radiotherapy in the summer 2020.  The bone metastasis in her left proximal humerus unfortunately led to a fracture of this site, orthopedics was involved, but the patient had not followed up following her radiotherapy.  She did have some improvement initially in her pain, however her functionality and pain has persisted and she completed her course of systemic cisplatin etoposide.  She is currently on systemic immunotherapy under the care of Dr. Julien Nordmann.  She has had somewhat of a mixed response with recent imaging on 01/20/2020.  Unfortunately a CT of the humerus on the left did reveal persistent bulky disease measuring up to 5.4 cm encircling and encasing the fracture site, she does have a 1 cm low right paratracheal lymph node, and improvement was also seen in the porta hepatic region.  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  She is seen today to discuss options of reirradiation.  On review of systems the patient reports that overall she has been doing quite well with the exception of pain and lack of function of her left arm.  She states that she has always had issues with rotator cuff issues.  This has been present bilaterally and she has had previous repair on the right.   She states that she is not having any numbness or tingling or loss of sensation in the extremity however.  She denies any shortness of breath chest pain fevers or chills, she denies any difficulty with eating or appetite, bowel or bladder activity, and no other musculoskeletal pains are otherwise noted.  A complete review of systems is obtained and is otherwise negative.  PAST MEDICAL HISTORY:  Past Medical History:  Diagnosis Date  . Cataract    Bilateral  . Constipation    pain medication  . Left rotator cuff tear   . Right rotator cuff tear   . SCL CA dx'd 11/2018    PAST SURGICAL HISTORY: Past Surgical History:  Procedure Laterality Date  . CATARACT EXTRACTION W/ INTRAOCULAR LENS  IMPLANT, BILATERAL  2012  . DECOMPRESSIVE LUMBAR LAMINECTOMY LEVEL 2 N/A 07/29/2013   Procedure: LUMBAR LAMINECTOMY, DECOMPRESSION LUMBAR THREE TO FOUR, FOUR TO FIVE microdiscectomy l3,4 right;  Surgeon: Tobi Bastos, MD;  Location: [facility];  Service: Orthopedics;  Laterality: N/A;  . I & D SUPERIOR RIGHT SHOULDER AND CLOSURE WOUND  01-10-2011   S/P ROTATOR CUFF REPAIR  . IR IMAGING GUIDED PORT INSERTION  04/09/2019  . LUMBAR LAMINECTOMY  1970'S  . LUMBAR LAMINECTOMY/DECOMPRESSION MICRODISCECTOMY N/A 06/27/2016   Procedure: L1 - L2 DISCECTOMY;  Surgeon: Melina Schools, MD;  Location: [facility];  Service: Orthopedics;  Laterality: N/A;  . [place]  . RIGHT SHOULDER ARTHROSCOPY/ OPEN DISTAL CLAVICLE RESECTION/ SAD/ OPEN ROTATOR CUFF REPAIR  11-28-2010  . SHOULDER OPEN ROTATOR  CUFF REPAIR  12/19/2011   Procedure: ROTATOR CUFF REPAIR SHOULDER OPEN;  Surgeon: Magnus Sinning, MD;  Location: [facility];  Service: Orthopedics;  Laterality: Right;  RIGHT RECURRENT OPEN REPAIR OF THE ROTATOR CUFF WITH TISSUE MEND GRAFTANTERIOR CHROMIOECTOMY  . VAGINAL HYSTERECTOMY  1979    PAST SOCIAL HISTORY:  Social History   Socioeconomic History  . Marital status: Divorced    Spouse name:  Not on file  . Number of children: 1  . Years of education: Not on file  . Highest education level: Not on file  Occupational History  . Not on file  Tobacco Use  . Smoking status: Current Every Day Smoker    Packs/day: 0.25    Years: 50.00    Pack years: 12.50    Types: Cigarettes  . Smokeless tobacco: Never Used  . Tobacco comment: trying to quit now, using the patch  Substance and Sexual Activity  . Alcohol use: Yes    Comment: RARE  . Drug use: No  . Sexual activity: Not on file  Other Topics Concern  . Not on file  Social History Narrative  . Not on file   Social Determinants of Health   Financial Resource Strain:   . Difficulty of Paying Living Expenses:   Food Insecurity:   . Worried About Charity fundraiser in the Last Year:   . Arboriculturist in the Last Year:   Transportation Needs: No Transportation Needs  . Lack of Transportation (Medical): No  . Lack of Transportation (Non-Medical): No  Physical Activity:   . Days of Exercise per Week:   . Minutes of Exercise per Session:   Stress:   . Feeling of Stress :   Social Connections:   . Frequency of Communication with Friends and Family:   . Frequency of Social Gatherings with Friends and Family:   . Attends Religious Services:   . Active Member of Clubs or Organizations:   . Attends Archivist Meetings:   Marland Kitchen Marital Status:   Intimate Partner Violence:   . Fear of Current or Ex-Partner:   . Emotionally Abused:   Marland Kitchen Physically Abused:   . Sexually Abused:     PAST FAMILY HISTORY: Family History  Problem Relation Age of Onset  . Congestive Heart Failure Mother   . Congestive Heart Failure Father     MEDICATIONS  Current Outpatient Medications  Medication Sig Dispense Refill  . b complex vitamins tablet Take 1 tablet by mouth daily.    . COD LIVER OIL PO Take 1 tablet by mouth daily.     . diphenhydrAMINE HCl (BENADRYL ALLERGY PO) Take 1 tablet by mouth daily.    . hydrocortisone 2.5 %  cream Apply topically as needed. 3.5 g 0  . Multiple Vitamin (MULTIVITAMIN) tablet Take 1 tablet by mouth daily.    Marland Kitchen oxymetazoline (AFRIN) 0.05 % nasal spray Place 2 sprays into the nose 2 (two) times daily as needed for congestion.    Vladimir Faster Glycol-Propyl Glycol (SYSTANE) 0.4-0.3 % SOLN Apply 1 drop to eye 2 (two) times daily as needed (dry eyes).    . sodium chloride (OCEAN) 0.65 % nasal spray Place 1 spray into the nose 2 (two) times daily as needed for congestion.    . vitamin C (ASCORBIC ACID) 500 MG tablet Take 500 mg by mouth daily.    . vitamin E 400 UNIT capsule Take 400 Units by mouth daily.    Marland Kitchen  prochlorperazine (COMPAZINE) 10 MG tablet Take 1 tablet (10 mg total) by mouth every 6 (six) hours as needed for nausea or vomiting. (Patient not taking: Reported on 01/25/2020) 30 tablet 0   No current facility-administered medications for this encounter.   Facility-Administered Medications Ordered in Other Encounters  Medication Dose Route Frequency Provider Last Rate Last Admin  . heparin lock flush 100 unit/mL  500 Units Intracatheter Once PRN Magrinat, Virgie Dad, MD      . sodium chloride flush (NS) 0.9 % injection 10 mL  10 mL Intracatheter PRN Magrinat, Virgie Dad, MD        ALLERGIES:  Allergies  Allergen Reactions  . Diclofenac Other (See Comments)   Physical exam Wt Readings from Last 3 Encounters:  01/27/20 132 lb 12.8 oz (60.2 kg)  01/25/20 131 lb 3.2 oz (59.5 kg)  12/21/19 130 lb 6.4 oz (59.1 kg)   Temp Readings from Last 3 Encounters:  01/27/20 98.5 F (36.9 C)  01/25/20 98.3 F (36.8 C) (Temporal)  12/21/19 97.8 F (36.6 C) (Temporal)   BP Readings from Last 3 Encounters:  01/27/20 (!) 145/60  01/25/20 139/87  12/21/19 (!) 143/58   Pulse Readings from Last 3 Encounters:  01/27/20 78  01/25/20 71  12/21/19 68   In general this is a well appearing Caucasian female in no acute distress.  She's alert and oriented x4 and appropriate throughout the  examination. Cardiopulmonary assessment is negative for acute distress and she exhibits normal effort.  No edema is noted of the hand or forearm, the patient has to use her right arm to elevate her left to approximately 90 degrees due to pain.  No other visible abnormalities are otherwise noted.  Impression/Plan: 1. 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 Dr. Lisbeth Renshaw discusses the imaging findings and reviews her course to date. Unfortunately she did not have improvement in her disease at this site, and in fact, it has progressed despite radiotherapy as well as systemic chemotherapy and immunotherapy. Re-irradiation would be an option, however Dr. Lisbeth Renshaw discusses that he thinks surgical pinning would be favored for stability and possibly improvement in pain as well followed by postoperative radiotherapy. I have reached out to Dr. Onnie Graham as well and he asked that she call to schedule an appointment with him to discuss options of palliative surgery. She is in agreement and I will follow up with her in a few weeks. We discussed the risks, benefits, short, and long term effects of radiotherapy, and the patient is interested in proceeding in the postoperative setting if she proceeds with surgery. Dr. Lisbeth Renshaw discusses the delivery and logistics of radiotherapy and anticipates a course of 2-3 weeks of radiotherapy. Written consent is obtained and placed in the chart, a copy was provided to the patient and we will coordinate simulation after coordinating with orthopedics. 2. PCI. She is aware that if she has a complete response, she may be a candidate for prophylactic cranial irradiation. This will be considered at a later time as well.   In a visit lasting 45 minutes, greater than 50% of the time was spent face to face discussing the patient's condition, in preparation for the discussion, and coordinating the patient's care.   The above  documentation reflects my direct findings during this shared patient visit. Please see the separate note by Dr. Lisbeth Renshaw on this date for the remainder of the patient's plan of care.     Bryson Ha  Margurite Auerbach, PAC

## 2020-01-28 ENCOUNTER — Telehealth: Payer: Self-pay | Admitting: Internal Medicine

## 2020-01-28 NOTE — Telephone Encounter (Signed)
Scheduled per los. Called and spoke with patient. Confirmed appt 

## 2020-02-02 ENCOUNTER — Telehealth: Payer: Self-pay | Admitting: Medical Oncology

## 2020-02-02 NOTE — Telephone Encounter (Signed)
Encouraged dtr to be on phone with pt at Dr Augustin Coupe appt.

## 2020-02-09 ENCOUNTER — Telehealth: Payer: Self-pay | Admitting: Radiation Oncology

## 2020-02-09 NOTE — Telephone Encounter (Signed)
I called and spoke with the patient to let her know that Dr. Onnie Graham had let me know she would proceed with surgery. She's scheduled for pinning on 03/10/20 of her left humerus and we will plan simulation about 2 weeks later. We can certainly reschedule if needed but I will call her a few days after her surgery to see how she's doing and make sure she'll be ready to plan therapy on 6/15 that I tentatively have scheduled for her.

## 2020-02-16 NOTE — Progress Notes (Signed)
Pharmacist Chemotherapy Monitoring - Follow Up Assessment    I verify that I have reviewed each item in the below checklist:  . Regimen for the patient is scheduled for the appropriate day and plan matches scheduled date. Marland Kitchen Appropriate non-routine labs are ordered dependent on drug ordered. . If applicable, additional medications reviewed and ordered per protocol based on lifetime cumulative doses and/or treatment regimen.   Plan for follow-up and/or issues identified: No . I-vent associated with next due treatment: No . MD and/or nursing notified: No  Stephenson Grosser D 02/16/2020 4:28 PM

## 2020-02-22 ENCOUNTER — Inpatient Hospital Stay: Payer: Federal, State, Local not specified - PPO

## 2020-02-22 ENCOUNTER — Other Ambulatory Visit: Payer: Self-pay

## 2020-02-22 ENCOUNTER — Encounter: Payer: Self-pay | Admitting: Internal Medicine

## 2020-02-22 ENCOUNTER — Inpatient Hospital Stay: Payer: Federal, State, Local not specified - PPO | Attending: Oncology | Admitting: Internal Medicine

## 2020-02-22 VITALS — BP 143/83 | HR 78 | Temp 98.9°F | Resp 18 | Ht 62.0 in | Wt 133.8 lb

## 2020-02-22 DIAGNOSIS — C7951 Secondary malignant neoplasm of bone: Secondary | ICD-10-CM | POA: Diagnosis not present

## 2020-02-22 DIAGNOSIS — Z79899 Other long term (current) drug therapy: Secondary | ICD-10-CM | POA: Insufficient documentation

## 2020-02-22 DIAGNOSIS — Z5112 Encounter for antineoplastic immunotherapy: Secondary | ICD-10-CM | POA: Insufficient documentation

## 2020-02-22 DIAGNOSIS — Z1211 Encounter for screening for malignant neoplasm of colon: Secondary | ICD-10-CM

## 2020-02-22 DIAGNOSIS — Z95828 Presence of other vascular implants and grafts: Secondary | ICD-10-CM

## 2020-02-22 DIAGNOSIS — C3411 Malignant neoplasm of upper lobe, right bronchus or lung: Secondary | ICD-10-CM

## 2020-02-22 DIAGNOSIS — R937 Abnormal findings on diagnostic imaging of other parts of musculoskeletal system: Secondary | ICD-10-CM

## 2020-02-22 LAB — CBC WITH DIFFERENTIAL/PLATELET
Abs Immature Granulocytes: 0.03 10*3/uL (ref 0.00–0.07)
Basophils Absolute: 0 10*3/uL (ref 0.0–0.1)
Basophils Relative: 0 %
Eosinophils Absolute: 0.2 10*3/uL (ref 0.0–0.5)
Eosinophils Relative: 2 %
HCT: 34.6 % — ABNORMAL LOW (ref 36.0–46.0)
Hemoglobin: 11.7 g/dL — ABNORMAL LOW (ref 12.0–15.0)
Immature Granulocytes: 0 %
Lymphocytes Relative: 24 %
Lymphs Abs: 2 10*3/uL (ref 0.7–4.0)
MCH: 32.2 pg (ref 26.0–34.0)
MCHC: 33.8 g/dL (ref 30.0–36.0)
MCV: 95.3 fL (ref 80.0–100.0)
Monocytes Absolute: 0.8 10*3/uL (ref 0.1–1.0)
Monocytes Relative: 9 %
Neutro Abs: 5.2 10*3/uL (ref 1.7–7.7)
Neutrophils Relative %: 65 %
Platelets: 214 10*3/uL (ref 150–400)
RBC: 3.63 MIL/uL — ABNORMAL LOW (ref 3.87–5.11)
RDW: 12.9 % (ref 11.5–15.5)
WBC: 8.1 10*3/uL (ref 4.0–10.5)
nRBC: 0 % (ref 0.0–0.2)

## 2020-02-22 LAB — COMPREHENSIVE METABOLIC PANEL
ALT: 18 U/L (ref 0–44)
AST: 21 U/L (ref 15–41)
Albumin: 3.7 g/dL (ref 3.5–5.0)
Alkaline Phosphatase: 74 U/L (ref 38–126)
Anion gap: 8 (ref 5–15)
BUN: 18 mg/dL (ref 8–23)
CO2: 24 mmol/L (ref 22–32)
Calcium: 8.8 mg/dL — ABNORMAL LOW (ref 8.9–10.3)
Chloride: 104 mmol/L (ref 98–111)
Creatinine, Ser: 0.72 mg/dL (ref 0.44–1.00)
GFR calc Af Amer: >60 mL/min (ref >60)
GFR calc non Af Amer: >60 mL/min (ref >60)
Glucose, Bld: 94 mg/dL (ref 70–99)
Potassium: 4.2 mmol/L (ref 3.5–5.1)
Sodium: 136 mmol/L (ref 135–145)
Total Bilirubin: 0.3 mg/dL (ref 0.3–1.2)
Total Protein: 6.7 g/dL (ref 6.5–8.1)

## 2020-02-22 LAB — TSH: TSH: 1.699 u[IU]/mL (ref 0.308–3.960)

## 2020-02-22 MED ORDER — SODIUM CHLORIDE 0.9% FLUSH
10.0000 mL | INTRAVENOUS | Status: DC | PRN
  Filled 2020-02-22: qty 10

## 2020-02-22 MED ORDER — SODIUM CHLORIDE 0.9 % IV SOLN
1500.0000 mg | Freq: Once | INTRAVENOUS | Status: AC
  Administered 2020-02-22: 1500 mg via INTRAVENOUS
  Filled 2020-02-22: qty 30

## 2020-02-22 MED ORDER — SODIUM CHLORIDE 0.9 % IV SOLN
Freq: Once | INTRAVENOUS | Status: AC
  Filled 2020-02-22: qty 250

## 2020-02-22 MED ORDER — HEPARIN SOD (PORK) LOCK FLUSH 100 UNIT/ML IV SOLN
500.0000 [IU] | Freq: Once | INTRAVENOUS | Status: AC | PRN
  Administered 2020-02-22: 500 [IU]
  Filled 2020-02-22: qty 5

## 2020-02-22 MED ORDER — SODIUM CHLORIDE 0.9% FLUSH
10.0000 mL | INTRAVENOUS | Status: DC | PRN
  Administered 2020-02-22 (×2): 10 mL
  Filled 2020-02-22: qty 10

## 2020-02-22 NOTE — Patient Instructions (Signed)
Steps to Quit Smoking Smoking tobacco is the leading cause of preventable death. It can affect almost every organ in the body. Smoking puts you and people around you at risk for many serious, long-lasting (chronic) diseases. Quitting smoking can be hard, but it is one of the best things that you can do for your health. It is never too late to quit. How do I get ready to quit? When you decide to quit smoking, make a plan to help you succeed. Before you quit:  Pick a date to quit. Set a date within the next 2 weeks to give you time to prepare.  Write down the reasons why you are quitting. Keep this list in places where you will see it often.  Tell your family, friends, and co-workers that you are quitting. Their support is important.  Talk with your doctor about the choices that may help you quit.  Find out if your health insurance will pay for these treatments.  Know the people, places, things, and activities that make you want to smoke (triggers). Avoid them. What first steps can I take to quit smoking?  Throw away all cigarettes at home, at work, and in your car.  Throw away the things that you use when you smoke, such as ashtrays and lighters.  Clean your car. Make sure to empty the ashtray.  Clean your home, including curtains and carpets. What can I do to help me quit smoking? Talk with your doctor about taking medicines and seeing a counselor at the same time. You are more likely to succeed when you do both.  If you are pregnant or breastfeeding, talk with your doctor about counseling or other ways to quit smoking. Do not take medicine to help you quit smoking unless your doctor tells you to do so. To quit smoking: Quit right away  Quit smoking totally, instead of slowly cutting back on how much you smoke over a period of time.  Go to counseling. You are more likely to quit if you go to counseling sessions regularly. Take medicine You may take medicines to help you quit. Some  medicines need a prescription, and some you can buy over-the-counter. Some medicines may contain a drug called nicotine to replace the nicotine in cigarettes. Medicines may:  Help you to stop having the desire to smoke (cravings).  Help to stop the problems that come when you stop smoking (withdrawal symptoms). Your doctor may ask you to use:  Nicotine patches, gum, or lozenges.  Nicotine inhalers or sprays.  Non-nicotine medicine that is taken by mouth. Find resources Find resources and other ways to help you quit smoking and remain smoke-free after you quit. These resources are most helpful when you use them often. They include:  Online chats with a counselor.  Phone quitlines.  Printed self-help materials.  Support groups or group counseling.  Text messaging programs.  Mobile phone apps. Use apps on your mobile phone or tablet that can help you stick to your quit plan. There are many free apps for mobile phones and tablets as well as websites. Examples include Quit Guide from the CDC and smokefree.gov  What things can I do to make it easier to quit?   Talk to your family and friends. Ask them to support and encourage you.  Call a phone quitline (1-800-QUIT-NOW), reach out to support groups, or work with a counselor.  Ask people who smoke to not smoke around you.  Avoid places that make you want to smoke,   such as: ? Bars. ? Parties. ? Smoke-break areas at work.  Spend time with people who do not smoke.  Lower the stress in your life. Stress can make you want to smoke. Try these things to help your stress: ? Getting regular exercise. ? Doing deep-breathing exercises. ? Doing yoga. ? Meditating. ? Doing a body scan. To do this, close your eyes, focus on one area of your body at a time from head to toe. Notice which parts of your body are tense. Try to relax the muscles in those areas. How will I feel when I quit smoking? Day 1 to 3 weeks Within the first 24 hours,  you may start to have some problems that come from quitting tobacco. These problems are very bad 2-3 days after you quit, but they do not often last for more than 2-3 weeks. You may get these symptoms:  Mood swings.  Feeling restless, nervous, angry, or annoyed.  Trouble concentrating.  Dizziness.  Strong desire for high-sugar foods and nicotine.  Weight gain.  Trouble pooping (constipation).  Feeling like you may vomit (nausea).  Coughing or a sore throat.  Changes in how the medicines that you take for other issues work in your body.  Depression.  Trouble sleeping (insomnia). Week 3 and afterward After the first 2-3 weeks of quitting, you may start to notice more positive results, such as:  Better sense of smell and taste.  Less coughing and sore throat.  Slower heart rate.  Lower blood pressure.  Clearer skin.  Better breathing.  Fewer sick days. Quitting smoking can be hard. Do not give up if you fail the first time. Some people need to try a few times before they succeed. Do your best to stick to your quit plan, and talk with your doctor if you have any questions or concerns. Summary  Smoking tobacco is the leading cause of preventable death. Quitting smoking can be hard, but it is one of the best things that you can do for your health.  When you decide to quit smoking, make a plan to help you succeed.  Quit smoking right away, not slowly over a period of time.  When you start quitting, seek help from your doctor, family, or friends. This information is not intended to replace advice given to you by your health care provider. Make sure you discuss any questions you have with your health care provider. Document Revised: 06/19/2019 Document Reviewed: 12/13/2018 Elsevier Patient Education  2020 [employer]  

## 2020-02-22 NOTE — Patient Instructions (Signed)
[facility] Discharge Instructions for Patients Receiving Chemotherapy  Today you received the following chemotherapy agents: Imfinzi  To help prevent nausea and vomiting after your treatment, we encourage you to take your nausea medication as prescribed.   If you develop nausea and vomiting that is not controlled by your nausea medication, call the clinic.   BELOW ARE SYMPTOMS THAT SHOULD BE REPORTED IMMEDIATELY:  *FEVER GREATER THAN 100.5 F  *CHILLS WITH OR WITHOUT FEVER  NAUSEA AND VOMITING THAT IS NOT CONTROLLED WITH YOUR NAUSEA MEDICATION  *UNUSUAL SHORTNESS OF BREATH  *UNUSUAL BRUISING OR BLEEDING  TENDERNESS IN MOUTH AND THROAT WITH OR WITHOUT PRESENCE OF ULCERS  *URINARY PROBLEMS  *BOWEL PROBLEMS  UNUSUAL RASH Items with * indicate a potential emergency and should be followed up as soon as possible.  Feel free to call the clinic should you have any questions or concerns. The clinic phone number is [phone].  Please show the [facility] at check-in to the Emergency Department and triage nurse.

## 2020-02-22 NOTE — Progress Notes (Signed)
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  PRIOR THERAPY: None  CURRENT THERAPY: Palliative systemic chemotherapy with carboplatin for AUC of 5 on day 1, etoposide 100 mg/M2 on days 1, 2 and 3 as well as Imfinzi 1500 mg every 3 weeks with the chemotherapy and Neulasta support. First dose on 03/16/2019. Status post 13 cycles.   Starting from cycle #5 the patient will be treated with maintenance treatment with Imfinzi 1500 mg IV every 4 weeks.  INTERVAL HISTORY: Brittany Marks 79 y.o. female returns to the clinic today for follow-up visit.  The patient is feeling fine today with no concerning complaints.  She denied having any current chest pain, shortness of breath, cough or hemoptysis.  She denied having any fever or chills.  She has no nausea, vomiting, diarrhea or constipation.  She has no headache or visual changes.  She continues to tolerate her treatment with maintenance Imfinzi fairly well.  She is here today for evaluation before starting cycle #14.   MEDICAL HISTORY: Past Medical History:  Diagnosis Date  . Cataract    Bilateral  . Constipation    pain medication  . Left rotator cuff tear   . Right rotator cuff tear   . SCL CA dx'd 11/2018    ALLERGIES:  is allergic to diclofenac.  MEDICATIONS:  Current Outpatient Medications  Medication Sig Dispense Refill  . b complex vitamins tablet Take 1 tablet by mouth daily.    . COD LIVER OIL PO Take 1 tablet by mouth daily.     . diphenhydrAMINE HCl (BENADRYL ALLERGY PO) Take 1 tablet by mouth daily.    . hydrocortisone 2.5 % cream  Apply topically as needed. 3.5 g 0  . lidocaine-prilocaine (EMLA) cream lidocaine-prilocaine 2.5 %-2.5 % topical cream  APPLY TOPICALLY AS NEEDED    . Multiple Vitamin (MULTIVITAMIN) tablet Take 1 tablet by mouth daily.    . nicotine (NICODERM CQ - DOSED IN MG/24 HOURS) 21 mg/24hr patch nicotine 21 mg/24 hr daily transdermal patch    . oxymetazoline (AFRIN) 0.05 % nasal spray Place 2 sprays into the nose 2 (two) times daily as needed for congestion.    Vladimir Faster Glycol-Propyl Glycol (SYSTANE) 0.4-0.3 % SOLN Apply 1 drop to eye 2 (two) times daily as needed (dry eyes).    . prochlorperazine (COMPAZINE) 10 MG tablet Take 1 tablet (10 mg total) by mouth every 6 (six) hours as needed for nausea or vomiting. (Patient not taking: Reported on 01/25/2020) 30 tablet 0  . sodium chloride (OCEAN) 0.65 % nasal spray Place 1 spray into the nose 2 (two) times daily as needed for congestion.    . vitamin C (ASCORBIC ACID) 500 MG tablet Take 500 mg by mouth daily.    . vitamin E 400 UNIT capsule Take 400 Units by mouth daily.     No current facility-administered medications for this visit.   Facility-Administered Medications Ordered in Other Visits  Medication Dose Route Frequency Provider Last Rate Last Admin  . heparin lock flush 100 unit/mL  500 Units Intracatheter Once PRN Magrinat, Virgie Dad, MD      . sodium  chloride flush (NS) 0.9 % injection 10 mL  10 mL Intracatheter PRN Magrinat, Virgie Dad, MD      . sodium chloride flush (NS) 0.9 % injection 10 mL  10 mL Intracatheter PRN Magrinat, Virgie Dad, MD   10 mL at 02/22/20 1220    SURGICAL HISTORY:  Past Surgical History:  Procedure Laterality Date  . CATARACT EXTRACTION W/ INTRAOCULAR LENS  IMPLANT, BILATERAL  2012  . DECOMPRESSIVE LUMBAR LAMINECTOMY LEVEL 2 N/A 07/29/2013   Procedure: LUMBAR LAMINECTOMY, DECOMPRESSION LUMBAR THREE TO FOUR, FOUR TO FIVE microdiscectomy l3,4 right;  Surgeon: Tobi Bastos, MD;  Location: [facility];  Service: Orthopedics;   Laterality: N/A;  . I & D SUPERIOR RIGHT SHOULDER AND CLOSURE WOUND  01-10-2011   S/P ROTATOR CUFF REPAIR  . IR IMAGING GUIDED PORT INSERTION  04/09/2019  . LUMBAR LAMINECTOMY  1970'S  . LUMBAR LAMINECTOMY/DECOMPRESSION MICRODISCECTOMY N/A 06/27/2016   Procedure: L1 - L2 DISCECTOMY;  Surgeon: Melina Schools, MD;  Location: [facility];  Service: Orthopedics;  Laterality: N/A;  . [place]  . RIGHT SHOULDER ARTHROSCOPY/ OPEN DISTAL CLAVICLE RESECTION/ SAD/ OPEN ROTATOR CUFF REPAIR  11-28-2010  . SHOULDER OPEN ROTATOR CUFF REPAIR  12/19/2011   Procedure: ROTATOR CUFF REPAIR SHOULDER OPEN;  Surgeon: Magnus Sinning, MD;  Location: [facility];  Service: Orthopedics;  Laterality: Right;  RIGHT RECURRENT OPEN REPAIR OF THE ROTATOR CUFF WITH TISSUE MEND GRAFTANTERIOR CHROMIOECTOMY  . VAGINAL HYSTERECTOMY  1979    REVIEW OF SYSTEMS:  A comprehensive review of systems was negative except for: Constitutional: positive for fatigue   PHYSICAL EXAMINATION: General appearance: alert, cooperative, fatigued and no distress Head: Normocephalic, without obvious abnormality, atraumatic Neck: no adenopathy, no JVD, supple, symmetrical, trachea midline and thyroid not enlarged, symmetric, no tenderness/mass/nodules Lymph nodes: Cervical, supraclavicular, and axillary nodes normal. Resp: clear to auscultation bilaterally Back: symmetric, no curvature. ROM normal. No CVA tenderness. Cardio: regular rate and rhythm, S1, S2 normal, no murmur, click, rub or gallop GI: soft, non-tender; bowel sounds normal; no masses,  no organomegaly Extremities: extremities normal, atraumatic, no cyanosis or edema   ECOG PERFORMANCE STATUS: 1 - Symptomatic but completely ambulatory  Blood pressure (!) 143/83, pulse 78, temperature 98.9 F (37.2 C), temperature source Temporal, resp. rate 18, height 5\' 2"  (1.575 m), weight 133 lb 12.8 oz (60.7 kg), SpO2 98 %.  LABORATORY DATA: Lab Results  Component  Value Date   WBC 8.1 02/22/2020   HGB 11.7 (L) 02/22/2020   HCT 34.6 (L) 02/22/2020   MCV 95.3 02/22/2020   PLT 214 02/22/2020      Chemistry      Component Value Date/Time   NA 138 01/25/2020 1102   K 4.5 01/25/2020 1102   CL 103 01/25/2020 1102   CO2 24 01/25/2020 1102   BUN 22 01/25/2020 1102   CREATININE 0.78 01/25/2020 1102   CREATININE 0.76 12/21/2019 1100      Component Value Date/Time   CALCIUM 9.1 01/25/2020 1102   ALKPHOS 72 01/25/2020 1102   AST 22 01/25/2020 1102   AST 22 12/21/2019 1100   ALT 20 01/25/2020 1102   ALT 13 12/21/2019 1100   BILITOT 0.3 01/25/2020 1102   BILITOT 0.3 12/21/2019 1100       RADIOGRAPHIC STUDIES: No results found.  ASSESSMENT AND PLAN: This is a very pleasant 79 years old white female with extensive stage small cell lung cancer and she is currently undergoing treatment with carboplatin, etoposide and  Imfinzi status post 5 cycles.  Starting from cycle #5 the patient is on maintenance treatment with single agent Imfinzi every 4 weeks status post 8 cycles. The patient continues to tolerate her treatment well with no concerning adverse effects. I recommended for her to proceed with her treatment today as planned. I will see her back for follow-up visit in 4 weeks for evaluation before the next cycle of her treatment. She was advised to call immediately if she has any concerning symptoms in the interval. The patient voices understanding of current disease status and treatment options and is in agreement with the current care plan.  All questions were answered. The patient knows to call the clinic with any problems, questions or concerns. We can certainly see the patient much sooner if necessary.  Disclaimer: This note was dictated with voice recognition software. Similar sounding words can inadvertently be transcribed and may not be corrected upon review.

## 2020-02-23 ENCOUNTER — Telehealth: Payer: Self-pay | Admitting: Internal Medicine

## 2020-02-23 NOTE — Telephone Encounter (Signed)
Scheduled per los. Called, not able to leave msg. Mailed printout  

## 2020-02-24 NOTE — Patient Instructions (Addendum)
DUE TO COVID-19 ONLY ONE VISITOR IS ALLOWED TO COME WITH YOU AND STAY IN THE WAITING ROOM ONLY DURING PRE OP AND PROCEDURE DAY OF SURGERY. THE 1 VISITOR MAY VISIT WITH YOU AFTER SURGERY IN YOUR PRIVATE ROOM DURING VISITING HOURS ONLY!  YOU NEED TO HAVE A COVID 19 TEST ON: 03/08/20 @  2:50 pm , THIS TEST MUST BE DONE BEFORE SURGERY, COME  [place], [place] , [address].  ([place]) ONCE YOUR COVID TEST IS COMPLETED, PLEASE BEGIN THE QUARANTINE INSTRUCTIONS AS OUTLINED IN YOUR HANDOUT.                Brittany Marks    Your procedure is scheduled on: 03/10/20   Report to [facility] Main  Entrance   Report to admitting at: 7:30 AM     Call this number if you have problems the morning of surgery [phone]    Remember:   NO SOLID FOOD AFTER MIDNIGHT THE NIGHT PRIOR TO SURGERY. NOTHING BY MOUTH EXCEPT CLEAR LIQUIDS UNTIL: 7:00 am. PLEASE FINISH ENSURE DRINK PER SURGEON ORDER  WHICH NEEDS TO BE COMPLETED AT: 7:00 am .   CLEAR LIQUID DIET   Foods Allowed                                                                     Foods Excluded  Coffee and tea, regular and decaf                             liquids that you cannot  Plain Jell-O any favor except red or purple                                           see through such as: Fruit ices (not with fruit pulp)                                     milk, soups, orange juice  Iced Popsicles                                    All solid food Carbonated beverages, regular and diet                                    Cranberry, grape and apple juices Sports drinks like Gatorade Lightly seasoned clear broth or consume(fat free) Sugar, honey syrup  Sample Menu Breakfast                                Lunch                                     Supper Cranberry juice  Beef broth                            Chicken broth Jell-O                                     Grape juice                            Apple juice Coffee or tea                        Jell-O                                      Popsicle                                                Coffee or tea                        Coffee or tea  _____________________________________________________________________  BRUSH YOUR TEETH MORNING OF SURGERY AND RINSE YOUR MOUTH OUT, NO CHEWING GUM CANDY OR MINT.    You may not have any metal on your body including hair pins and              piercings  Do not wear jewelry, lotions, powders or perfumes, deodorant             Men may shave face and neck.   Do not bring valuables to the hospital. [place].  Contacts, dentures or bridgework may not be worn into surgery.  Leave suitcase in the car. After surgery it may be brought to your room.     Patients discharged the day of surgery will not be allowed to drive home. IF YOU ARE HAVING SURGERY AND GOING HOME THE SAME DAY, YOU MUST HAVE AN ADULT TO DRIVE YOU HOME AND BE WITH YOU FOR 24 HOURS. YOU MAY GO HOME BY TAXI OR UBER OR ORTHERWISE, BUT AN ADULT MUST ACCOMPANY YOU HOME AND STAY WITH YOU FOR 24 HOURS.  Name and phone number of your driver:  Special Instructions: N/A              Please read over the following fact sheets you were given: _____________________________________________________________________             [facility]- Preparing for Total Shoulder Arthroplasty    Before surgery, you can play an important role. Because skin is not sterile, your skin needs to be as free of germs as possible. You can reduce the number of germs on your skin by using the following products. . Benzoyl Peroxide Gel o Reduces the number of germs present on the skin o Applied twice a day to shoulder area starting two days before surgery    ==================================================================  Please follow these instructions carefully:  BENZOYL PEROXIDE 5% GEL  Please do  not use if you have an allergy to benzoyl peroxide.   If your skin  becomes reddened/irritated stop using the benzoyl peroxide.  Starting two days before surgery, apply as follows: 1. Apply benzoyl peroxide in the morning and at night. Apply after taking a shower. If you are not taking a shower clean entire shoulder front, back, and side along with the armpit with a clean wet washcloth.  2. Place a quarter-sized dollop on your shoulder and rub in thoroughly, making sure to cover the front, back, and side of your shoulder, along with the armpit.   2 days before ____ AM   ____ PM              1 day before ____ AM   ____ PM                         3. Do this twice a day for two days.  (Last application is the night before surgery, AFTER using the CHG soap as described below).  4. Do NOT apply benzoyl peroxide gel on the day of surgery.  [place] - Preparing for Surgery Before surgery, you can play an important role.  Because skin is not sterile, your skin needs to be as free of germs as possible.  You can reduce the number of germs on your skin by washing with CHG (chlorahexidine gluconate) soap before surgery.  CHG is an antiseptic cleaner which kills germs and bonds with the skin to continue killing germs even after washing. Please DO NOT use if you have an allergy to CHG or antibacterial soaps.  If your skin becomes reddened/irritated stop using the CHG and inform your nurse when you arrive at Short Stay. Do not shave (including legs and underarms) for at least 48 hours prior to the first CHG shower.  You may shave your face/neck. Please follow these instructions carefully:  1.  Shower with CHG Soap the night before surgery and the  morning of Surgery.  2.  If you choose to wash your hair, wash your hair first as usual with your  normal  shampoo.  3.  After you shampoo, rinse your hair and body thoroughly to remove the  shampoo.                           4.  Use CHG as you would any other  liquid soap.  You can apply chg directly  to the skin and wash                       Gently with a scrungie or clean washcloth.  5.  Apply the CHG Soap to your body ONLY FROM THE NECK DOWN.   Do not use on face/ open                           Wound or open sores. Avoid contact with eyes, ears mouth and genitals (private parts).                       Wash face,  Genitals (private parts) with your normal soap.             6.  Wash thoroughly, paying special attention to the area where your surgery  will be performed.  7.  Thoroughly rinse your body with warm water from the neck down.  8.  DO NOT shower/wash with your normal soap after using and  rinsing off  the CHG Soap.                9.  Pat yourself dry with a clean towel.            10.  Wear clean pajamas.            11.  Place clean sheets on your bed the night of your first shower and do not  sleep with pets. Day of Surgery : Do not apply any lotions/deodorants the morning of surgery.  Please wear clean clothes to the hospital/surgery center.  FAILURE TO FOLLOW THESE INSTRUCTIONS MAY RESULT IN THE CANCELLATION OF YOUR SURGERY PATIENT SIGNATURE_________________________________  NURSE SIGNATURE__________________________________  ________________________________________________________________________   Adam Phenix  An incentive spirometer is a tool that can help keep your lungs clear and active. This tool measures how well you are filling your lungs with each breath. Taking long deep breaths may help reverse or decrease the chance of developing breathing (pulmonary) problems (especially infection) following:  A long period of time when you are unable to move or be active. BEFORE THE PROCEDURE   If the spirometer includes an indicator to show your best effort, your nurse or respiratory therapist will set it to a desired goal.  If possible, sit up straight or lean slightly forward. Try not to slouch.  Hold the incentive  spirometer in an upright position. INSTRUCTIONS FOR USE  1. Sit on the edge of your bed if possible, or sit up as far as you can in bed or on a chair. 2. Hold the incentive spirometer in an upright position. 3. Breathe out normally. 4. Place the mouthpiece in your mouth and seal your lips tightly around it. 5. Breathe in slowly and as deeply as possible, raising the piston or the ball toward the top of the column. 6. Hold your breath for 3-5 seconds or for as long as possible. Allow the piston or ball to fall to the bottom of the column. 7. Remove the mouthpiece from your mouth and breathe out normally. 8. Rest for a few seconds and repeat Steps 1 through 7 at least 10 times every 1-2 hours when you are awake. Take your time and take a few normal breaths between deep breaths. 9. The spirometer may include an indicator to show your best effort. Use the indicator as a goal to work toward during each repetition. 10. After each set of 10 deep breaths, practice coughing to be sure your lungs are clear. If you have an incision (the cut made at the time of surgery), support your incision when coughing by placing a pillow or rolled up towels firmly against it. Once you are able to get out of bed, walk around indoors and cough well. You may stop using the incentive spirometer when instructed by your caregiver.  RISKS AND COMPLICATIONS  Take your time so you do not get dizzy or light-headed.  If you are in pain, you may need to take or ask for pain medication before doing incentive spirometry. It is harder to take a deep breath if you are having pain. AFTER USE  Rest and breathe slowly and easily.  It can be helpful to keep track of a log of your progress. Your caregiver can provide you with a simple table to help with this. If you are using the spirometer at home, follow these instructions: [place] IF:   You are having difficultly using the spirometer.  You have trouble using the  spirometer as often as instructed.  Your pain medication is not giving enough relief while using the spirometer.  You develop fever of 100.5 F (38.1 C) or higher. SEEK IMMEDIATE MEDICAL CARE IF:   You cough up bloody sputum that had not been present before.  You develop fever of 102 F (38.9 C) or greater.  You develop worsening pain at or near the incision site. MAKE SURE YOU:   Understand these instructions.  Will watch your condition.  Will get help right away if you are not doing well or get worse. Document Released: 02/04/2007 Document Revised: 12/17/2011 Document Reviewed: 04/07/2007 [facility] Patient Information 2014 [place], [place].   ________________________________________________________________________

## 2020-02-25 ENCOUNTER — Encounter (HOSPITAL_COMMUNITY)
Admission: RE | Admit: 2020-02-25 | Discharge: 2020-02-25 | Disposition: A | Discharge status: Home / Self Care | Payer: Federal, State, Local not specified - PPO | Source: Ambulatory Visit | Attending: Orthopedic Surgery | Admitting: Orthopedic Surgery

## 2020-02-25 ENCOUNTER — Encounter (HOSPITAL_COMMUNITY): Payer: Self-pay

## 2020-02-25 ENCOUNTER — Other Ambulatory Visit: Payer: Self-pay

## 2020-02-25 DIAGNOSIS — Z01812 Encounter for preprocedural laboratory examination: Secondary | ICD-10-CM | POA: Insufficient documentation

## 2020-02-25 NOTE — Progress Notes (Signed)
PCP - No PCP Cardiologist -   Chest x-ray - CT: 10/23/19 EKG - 12/04/19 Stress Test -  ECHO -  Cardiac Cath -   Sleep Study -  CPAP -   Fasting Blood Sugar -  Checks Blood Sugar _____ times a day  Blood Thinner Instructions: Aspirin Instructions: Last Dose:  Anesthesia review: Pt. Was advised by RN to stop smoking before surgery.Pt. verbalized her understanding of the subject.  Patient denies shortness of breath, fever, cough and chest pain at PAT appointment   Patient verbalized understanding of instructions that were given to them at the PAT appointment. Patient was also instructed that they will need to review over the PAT instructions again at home before surgery.

## 2020-03-02 ENCOUNTER — Encounter (HOSPITAL_COMMUNITY)
Admission: RE | Admit: 2020-03-02 | Discharge: 2020-03-02 | Disposition: A | Discharge status: Home / Self Care | Payer: Federal, State, Local not specified - PPO | Source: Ambulatory Visit | Attending: Orthopedic Surgery | Admitting: Orthopedic Surgery

## 2020-03-02 ENCOUNTER — Other Ambulatory Visit: Payer: Self-pay

## 2020-03-02 DIAGNOSIS — Z01812 Encounter for preprocedural laboratory examination: Secondary | ICD-10-CM | POA: Diagnosis not present

## 2020-03-02 LAB — CBC
HCT: 37.8 % (ref 36.0–46.0)
Hemoglobin: 12.4 g/dL (ref 12.0–15.0)
MCH: 32.4 pg (ref 26.0–34.0)
MCHC: 32.8 g/dL (ref 30.0–36.0)
MCV: 98.7 fL (ref 80.0–100.0)
Platelets: 212 10*3/uL (ref 150–400)
RBC: 3.83 MIL/uL — ABNORMAL LOW (ref 3.87–5.11)
RDW: 12.8 % (ref 11.5–15.5)
WBC: 9.5 10*3/uL (ref 4.0–10.5)
nRBC: 0 % (ref 0.0–0.2)

## 2020-03-02 LAB — SURGICAL PCR SCREEN
MRSA, PCR: NEGATIVE
Staphylococcus aureus: POSITIVE — AB

## 2020-03-03 ENCOUNTER — Ambulatory Visit (INDEPENDENT_AMBULATORY_CARE_PROVIDER_SITE_OTHER): Payer: Federal, State, Local not specified - PPO | Admitting: Cardiovascular Disease

## 2020-03-03 ENCOUNTER — Encounter: Payer: Self-pay | Admitting: Cardiovascular Disease

## 2020-03-03 VITALS — BP 128/62 | HR 82 | Ht 62.0 in | Wt 132.4 lb

## 2020-03-03 DIAGNOSIS — I2510 Atherosclerotic heart disease of native coronary artery without angina pectoris: Secondary | ICD-10-CM | POA: Diagnosis not present

## 2020-03-03 DIAGNOSIS — I6523 Occlusion and stenosis of bilateral carotid arteries: Secondary | ICD-10-CM | POA: Diagnosis not present

## 2020-03-03 DIAGNOSIS — I771 Stricture of artery: Secondary | ICD-10-CM

## 2020-03-03 NOTE — Progress Notes (Signed)
PCR: positive for STAPH

## 2020-03-03 NOTE — Patient Instructions (Signed)
Medication Instructions:  The current medical regimen is effective;  continue present plan and medications.  *If you need a refill on your cardiac medications before your next appointment, please call your pharmacy*    Follow-Up: At [facility], you and your health needs are our priority.  As part of our continuing mission to provide you with exceptional heart care, we have created designated Provider Care Teams.  These Care Teams include your primary Cardiologist (physician) and Advanced Practice Providers (APPs -  Physician Assistants and Nurse Practitioners) who all work together to provide you with the care you need, when you need it.  We recommend signing up for the patient portal called "MyChart".  Sign up information is provided on this After Visit Summary.  MyChart is used to connect with patients for Virtual Visits (Telemedicine).  Patients are able to view lab/test results, encounter notes, upcoming appointments, etc.  Non-urgent messages can be sent to your provider as well.   To learn more about what you can do with MyChart, go to [email or web address].    Your next appointment:   As needed  The format for your next appointment:   In Person  Provider:   Lonoke O'Neal, MD      

## 2020-03-03 NOTE — Progress Notes (Signed)
Cardiology Office Note:   Date:  03/03/2020  NAME:  Brittany Marks    MRN: [id number] DOB:  1941/02/15   PCP:  Default, Provider, MD  Cardiologist:  No primary care provider on file.  Electrophysiologist:  None   Referring MD: No ref. provider found   Chief Complaint  Patient presents with  . Follow-up   History of Present Illness:   Brittany Marks is a 79 y.o. female with a hx of metastatic lung CA, carotid artery disease, CAD, who presents for follow-up. Was evaluated 12/04/2019 for CAD seen on lung CT. Did have bruits. Has carotid artery stenoses and R subclavian disease. No symptoms. Not wanting aggressive measures.   She reports he has been doing well.  I did discuss the recent ultrasound of her neck that showed bilateral carotid artery disease.  She also likely has right subclavian stenosis.  She has no symptoms from this.  She appears to be doing well.  She apparently has metastatic disease to her left shoulder.  She will undergo surgery for this.  I see no need to stop this for further testing.  She is quite short of breath with exertion.  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  She is on palliative chemotherapy.  She denies any chest pain or pressure with her current level of activity.  She is mainly doing activities around the house.  She does express a desire to not pursue any medications or interventions that are aggressive especially given her poor prognosis from metastatic lung cancer.  She does recognize that she may not have much time left.  I did approach her about an aspirin and a statin therapy however she sees no need for this at this time.  I do agree with her given her prognosis of lung cancer.  She is without real symptoms from any cardiovascular disease.  She denies chest pain, shortness of breath or palpitations today.  Problem List 1. 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 CAD -CAC seen on CT 3. Tobacco abuse -50 pack years  4.  Carotid artery disease -R ICA 1-39% -L ICA 40-59% -R subclavian stenosis   Past Medical History: Past Medical History:  Diagnosis Date  . Cataract    Bilateral  . Constipation    pain medication  . Left rotator cuff tear   . Right rotator cuff tear   . SCL CA dx'd 11/2018    Past Surgical History: Past Surgical History:  Procedure Laterality Date  . CATARACT EXTRACTION W/ INTRAOCULAR LENS  IMPLANT, BILATERAL  2012  . DECOMPRESSIVE LUMBAR LAMINECTOMY LEVEL 2 N/A 07/29/2013   Procedure: LUMBAR LAMINECTOMY, DECOMPRESSION LUMBAR THREE TO FOUR, FOUR TO FIVE microdiscectomy l3,4 right;  Surgeon: Tobi Bastos, MD;  Location: [facility];  Service: Orthopedics;  Laterality: N/A;  . I & D SUPERIOR RIGHT SHOULDER AND CLOSURE WOUND  01-10-2011   S/P ROTATOR CUFF REPAIR  . IR IMAGING GUIDED PORT INSERTION  04/09/2019  . LUMBAR LAMINECTOMY  1970'S  . LUMBAR LAMINECTOMY/DECOMPRESSION MICRODISCECTOMY N/A 06/27/2016   Procedure: L1 - L2 DISCECTOMY;  Surgeon: Melina Schools, MD;  Location: [facility];  Service: Orthopedics;  Laterality: N/A;  . [place]  . RIGHT SHOULDER ARTHROSCOPY/ OPEN DISTAL CLAVICLE RESECTION/ SAD/ OPEN ROTATOR CUFF REPAIR  11-28-2010  . SHOULDER OPEN ROTATOR CUFF REPAIR  12/19/2011   Procedure: ROTATOR CUFF REPAIR SHOULDER OPEN;  Surgeon: Magnus Sinning, MD;  Location: [facility];  Service: Orthopedics;  Laterality: Right;  RIGHT RECURRENT OPEN REPAIR OF THE ROTATOR CUFF WITH TISSUE MEND GRAFTANTERIOR CHROMIOECTOMY  . VAGINAL HYSTERECTOMY  1979    Current Medications: No outpatient medications have been marked as taking for the 03/03/20 encounter (Office Visit) with Geralynn Rile, MD.     Allergies:    Diclofenac   Social History: Social History   Socioeconomic History  . Marital status: Divorced    Spouse name: Not on file  . Number of children: 1  . Years of education: Not on file  . Highest education level: Not on file    Occupational History  . Not on file  Tobacco Use  . Smoking status: Current Every Day Smoker    Packs/day: 0.25    Years: 50.00    Pack years: 12.50    Types: Cigarettes  . Smokeless tobacco: Never Used  . Tobacco comment: trying to quit now, using the patch  Substance and Sexual Activity  . Alcohol use: Yes    Comment: RARE  . Drug use: No  . Sexual activity: Not on file  Other Topics Concern  . Not on file  Social History Narrative  . Not on file   Social Determinants of Health   Financial Resource Strain:   . Difficulty of Paying Living Expenses:   Food Insecurity:   . Worried About Charity fundraiser in the Last Year:   . Arboriculturist in the Last Year:   Transportation Needs: No Transportation Needs  . Lack of Transportation (Medical): No  . Lack of Transportation (Non-Medical): No  Physical Activity:   . Days of Exercise per Week:   . Minutes of Exercise per Session:   Stress:   . Feeling of Stress :   Social Connections:   . Frequency of Communication with Friends and Family:   . Frequency of Social Gatherings with Friends and Family:   . Attends Religious Services:   . Active Member of Clubs or Organizations:   . Attends Archivist Meetings:   Marland Kitchen Marital Status:      Family History: The patient's family history includes Congestive Heart Failure in her father and mother.  ROS:   All other ROS reviewed and negative. Pertinent positives noted in the HPI.     EKGs/Labs/Other Studies Reviewed:   The following studies were personally reviewed by me today:  Carotid US 12/11/2019  Summary:  Right Carotid: Velocities in the right ICA are consistent with a 1-39%  stenosis.   Left Carotid: Velocities in the left ICA are consistent with a 40-59%  stenosis.   Vertebrals: Right vertebral artery demonstrates antegrade flow. Left  vertebral        artery demonstrates retrograde flow.  Subclavians: Right subclavian artery was stenotic.  Normal flow  hemodynamics were        seen in the left subclavian artery.  Normal biphasic waveform in the right brachial artery. Dampened biphasic  waveform in the left brachial artery. There is a 20 mmHg pressure  difference between the arms, the left being the lowest.    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   Recent Labs: 02/22/2020: ALT 18; BUN 18; Creatinine, Ser 0.72; Potassium 4.2; Sodium 136; TSH 1.699 03/02/2020: Hemoglobin 12.4; Platelets 212   Recent Lipid Panel No results found for: CHOL, TRIG, HDL, CHOLHDL, VLDL, LDLCALC, LDLDIRECT  Physical Exam:  VS:  BP 128/62   Pulse 82   Ht 5\' 2"  (1.575 m)   Wt 132 lb 6.4 oz (60.1 kg)   SpO2 97%   BMI 24.22 kg/m    Wt Readings from Last 3 Encounters:  03/03/20 132 lb 6.4 oz (60.1 kg)  03/02/20 131 lb 6 oz (59.6 kg)  02/25/20 133 lb (60.3 kg)    General: Well nourished, well developed, in no acute distress Heart: Atraumatic, normal size  Eyes: PEERLA, EOMI  Neck: Bilateral carotid bruits Endocrine: No thryomegaly Cardiac: Normal S1, S2; RRR; no murmurs, rubs, or gallops Lungs: Clear to auscultation bilaterally, no wheezing, rhonchi or rales  Abd: Soft, nontender, no hepatomegaly  Ext: Diminished pulses Musculoskeletal: No deformities, BUE and BLE strength normal and equal Skin: Warm and dry, no rashes   Neuro: Alert and oriented to person, place, time, and situation, CNII-XII grossly intact, no focal deficits  Psych: Normal mood and affect   ASSESSMENT:   Brittany Marks is a 79 y.o. female who presents for the following: 1. Coronary artery disease involving native coronary artery of native heart without angina pectoris   2. Bilateral carotid artery stenosis   3. Subclavian artery stenosis, right (HCC)     PLAN:   1. Coronary artery disease involving native coronary artery of native heart  without angina pectoris -Coronary calcification seen on CT scan.  This was done for lung cancer staging.  She has no symptoms of angina.  She does have metastatic lung cancer is on palliative chemotherapy.  I did approach her about an aspirin and statin therapy.  Due to her poor prognosis she sees no benefit in this.  I do agree with her.  She would not get long-term benefit from either of these therapies.  We have decided to forego any medications at this time.  She is without symptoms we will see her on an as-needed basis.  2. Bilateral carotid artery stenosis --R ICA 1-39% -L ICA 40-59% -No stroke symptoms.  She has declined aspirin statin.  See above.  On palliative chemotherapy.  Unclear benefit given poor short-term prognosis.  3. Subclavian artery stenosis, right ([place]) -She does have this as well.  No symptoms from it.  We will watch this.  Disposition: Return if symptoms worsen or fail to improve.  Medication Adjustments/Labs and Tests Ordered: Current medicines are reviewed at length with the patient today.  Concerns regarding medicines are outlined above.  No orders of the defined types were placed in this encounter.  No orders of the defined types were placed in this encounter.   Patient Instructions  Medication Instructions:  The current medical regimen is effective;  continue present plan and medications.  *If you need a refill on your cardiac medications before your next appointment, please call your pharmacy*   Follow-Up: At [facility], you and your health needs are our priority.  As part of our continuing mission to provide you with exceptional heart care, we have created designated Provider Care Teams.  These Care Teams include your primary Cardiologist (physician) and Advanced Practice Providers (APPs -  Physician Assistants and Nurse Practitioners) who all work together to provide you with the care you need, when you need it.  We recommend signing up for the  patient portal called "MyChart".  Sign up information is provided on this After Visit Summary.  MyChart is used to connect with patients for Virtual Visits (Telemedicine).  Patients are able to view lab/test results, encounter notes, upcoming appointments,  etc.  Non-urgent messages can be sent to your provider as well.   To learn more about what you can do with MyChart, go to [email or web address].    Your next appointment:   As needed  The format for your next appointment:   In Person  Provider:   Eleonore Chiquito, MD        Time Spent with Patient: I have spent a total of 25 minutes with patient reviewing hospital notes, telemetry, EKGs, labs and examining the patient as well as establishing an assessment and plan that was discussed with the patient.  > 50% of time was spent in direct patient care.  Signed, Addison Naegeli. Audie Box, [place]  [address], [place], [place] [address] [phone]  03/03/2020 5:06 PM

## 2020-03-08 ENCOUNTER — Other Ambulatory Visit (HOSPITAL_COMMUNITY)
Admission: RE | Admit: 2020-03-08 | Discharge: 2020-03-08 | Disposition: A | Discharge status: Home / Self Care | Payer: Federal, State, Local not specified - PPO | Source: Ambulatory Visit | Attending: Orthopedic Surgery | Admitting: Orthopedic Surgery

## 2020-03-08 DIAGNOSIS — Z20822 Contact with and (suspected) exposure to covid-19: Secondary | ICD-10-CM | POA: Insufficient documentation

## 2020-03-08 DIAGNOSIS — Z01812 Encounter for preprocedural laboratory examination: Secondary | ICD-10-CM | POA: Insufficient documentation

## 2020-03-09 LAB — SARS CORONAVIRUS 2 (TAT 6-24 HRS): SARS Coronavirus 2: NEGATIVE

## 2020-03-09 NOTE — Anesthesia Preprocedure Evaluation (Addendum)
Anesthesia Evaluation  Patient identified by MRN, date of birth, ID band Patient awake    Reviewed: Allergy & Precautions, NPO status , Patient's Chart, lab work & pertinent test results  History of Anesthesia Complications Negative for: history of anesthetic complications  Airway Mallampati: II  TM Distance: >3 FB Neck ROM: Full    Dental  (+) Edentulous Lower, Edentulous Upper   Pulmonary Current Smoker and Patient abstained from smoking.,    Pulmonary exam normal        Cardiovascular negative cardio ROS Normal cardiovascular exam     Neuro/Psych negative neurological ROS  negative psych ROS   GI/Hepatic negative GI ROS, Neg liver ROS,   Endo/Other  negative endocrine ROS  Renal/GU negative Renal ROS  negative genitourinary   Musculoskeletal negative musculoskeletal ROS (+)   Abdominal   Peds  Hematology negative hematology ROS (+)   Anesthesia Other Findings Day of surgery medications reviewed with patient.  Reproductive/Obstetrics negative OB ROS                            Anesthesia Physical Anesthesia Plan  ASA: III  Anesthesia Plan: General   Post-op Pain Management:    Induction: Intravenous  PONV Risk Score and Plan: 3 and Treatment may vary due to age or medical condition, Ondansetron and Dexamethasone  Airway Management Planned: Oral ETT  Additional Equipment: None  Intra-op Plan:   Post-operative Plan: Extubation in OR  Informed Consent: I have reviewed the patients History and Physical, chart, labs and discussed the procedure including the risks, benefits and alternatives for the proposed anesthesia with the patient or authorized representative who has indicated his/her understanding and acceptance.     Dental advisory given  Plan Discussed with: CRNA  Anesthesia Plan Comments: (Discussed options for pain control with patient. Discussed elevated risk of  pulmonary complications with ISB. Patient declines ISB. Daiva Huge, MD)       Anesthesia Quick Evaluation

## 2020-03-10 ENCOUNTER — Encounter (HOSPITAL_COMMUNITY): Payer: Self-pay | Admitting: Orthopedic Surgery

## 2020-03-10 ENCOUNTER — Ambulatory Visit (HOSPITAL_COMMUNITY)
Admission: RE | Admit: 2020-03-10 | Discharge: 2020-03-11 | Disposition: A | Discharge status: Home / Self Care | Payer: Federal, State, Local not specified - PPO | Attending: Orthopedic Surgery | Admitting: Orthopedic Surgery

## 2020-03-10 ENCOUNTER — Encounter (HOSPITAL_COMMUNITY)
Admission: RE | Disposition: A | Discharge status: Home / Self Care | Payer: Self-pay | Source: Home / Self Care | Attending: Orthopedic Surgery

## 2020-03-10 ENCOUNTER — Ambulatory Visit (HOSPITAL_COMMUNITY): Payer: Federal, State, Local not specified - PPO | Admitting: Physician Assistant

## 2020-03-10 ENCOUNTER — Other Ambulatory Visit: Payer: Self-pay

## 2020-03-10 DIAGNOSIS — Z96612 Presence of left artificial shoulder joint: Secondary | ICD-10-CM

## 2020-03-10 DIAGNOSIS — D1602 Benign neoplasm of scapula and long bones of left upper limb: Secondary | ICD-10-CM | POA: Diagnosis not present

## 2020-03-10 DIAGNOSIS — C3490 Malignant neoplasm of unspecified part of unspecified bronchus or lung: Secondary | ICD-10-CM | POA: Insufficient documentation

## 2020-03-10 DIAGNOSIS — Z79899 Other long term (current) drug therapy: Secondary | ICD-10-CM | POA: Insufficient documentation

## 2020-03-10 DIAGNOSIS — F17210 Nicotine dependence, cigarettes, uncomplicated: Secondary | ICD-10-CM | POA: Diagnosis not present

## 2020-03-10 DIAGNOSIS — C799 Secondary malignant neoplasm of unspecified site: Secondary | ICD-10-CM | POA: Insufficient documentation

## 2020-03-10 DIAGNOSIS — M84422A Pathological fracture, left humerus, initial encounter for fracture: Secondary | ICD-10-CM | POA: Insufficient documentation

## 2020-03-10 HISTORY — PX: REVERSE SHOULDER ARTHROPLASTY: SHX5054

## 2020-03-10 SURGERY — ARTHROPLASTY, SHOULDER, TOTAL, REVERSE
Anesthesia: General | Site: Shoulder | Laterality: Left

## 2020-03-10 MED ORDER — FENTANYL CITRATE (PF) 250 MCG/5ML IJ SOLN
INTRAMUSCULAR | Status: DC | PRN
  Administered 2020-03-10 (×2): 50 ug via INTRAVENOUS

## 2020-03-10 MED ORDER — ONDANSETRON HCL 4 MG/2ML IJ SOLN
INTRAMUSCULAR | Status: DC | PRN
  Administered 2020-03-10: 4 mg via INTRAVENOUS

## 2020-03-10 MED ORDER — OXYCODONE HCL 5 MG PO TABS: 5.0000 mg | ORAL_TABLET | Freq: Once | ORAL | Status: DC | PRN

## 2020-03-10 MED ORDER — EPHEDRINE 5 MG/ML INJ
INTRAVENOUS | Status: AC
  Filled 2020-03-10: qty 10

## 2020-03-10 MED ORDER — VANCOMYCIN HCL 1000 MG IV SOLR
INTRAVENOUS | Status: DC | PRN
  Administered 2020-03-10: 1000 mg via TOPICAL

## 2020-03-10 MED ORDER — BISACODYL 5 MG PO TBEC: 5.0000 mg | DELAYED_RELEASE_TABLET | Freq: Every day | ORAL | Status: DC | PRN

## 2020-03-10 MED ORDER — EPHEDRINE SULFATE 50 MG/ML IJ SOLN
INTRAMUSCULAR | Status: DC | PRN
Start: 2020-03-10 — End: 2020-03-10
  Administered 2020-03-10: 5 mg via INTRAVENOUS

## 2020-03-10 MED ORDER — DEXAMETHASONE SODIUM PHOSPHATE 10 MG/ML IJ SOLN
INTRAMUSCULAR | Status: AC
  Filled 2020-03-10: qty 1

## 2020-03-10 MED ORDER — BUPIVACAINE HCL (PF) 0.25 % IJ SOLN
INTRAMUSCULAR | Status: DC | PRN
  Administered 2020-03-10: 30 mL

## 2020-03-10 MED ORDER — ONDANSETRON HCL 4 MG/2ML IJ SOLN
INTRAMUSCULAR | Status: AC
  Filled 2020-03-10: qty 2

## 2020-03-10 MED ORDER — PROPOFOL 10 MG/ML IV BOLUS
INTRAVENOUS | Status: DC | PRN
  Administered 2020-03-10: 100 mg via INTRAVENOUS

## 2020-03-10 MED ORDER — ONDANSETRON HCL 4 MG/2ML IJ SOLN
4.0000 mg | Freq: Four times a day (QID) | INTRAMUSCULAR | Status: DC | PRN
  Administered 2020-03-10: 4 mg via INTRAVENOUS
  Filled 2020-03-10: qty 2

## 2020-03-10 MED ORDER — SUGAMMADEX SODIUM 200 MG/2ML IV SOLN
INTRAVENOUS | Status: DC | PRN
  Administered 2020-03-10: 200 mg via INTRAVENOUS

## 2020-03-10 MED ORDER — FENTANYL CITRATE (PF) 100 MCG/2ML IJ SOLN
INTRAMUSCULAR | Status: AC
  Administered 2020-03-10: 50 ug via INTRAVENOUS
  Filled 2020-03-10: qty 4

## 2020-03-10 MED ORDER — LACTATED RINGERS IV SOLN: INTRAVENOUS | Status: DC

## 2020-03-10 MED ORDER — STERILE WATER FOR IRRIGATION IR SOLN
Status: DC | PRN
  Administered 2020-03-10: 2000 mL

## 2020-03-10 MED ORDER — OXYCODONE HCL 5 MG/5ML PO SOLN: 5.0000 mg | Freq: Once | ORAL | Status: DC | PRN

## 2020-03-10 MED ORDER — LIDOCAINE 2% (20 MG/ML) 5 ML SYRINGE
INTRAMUSCULAR | Status: AC
  Filled 2020-03-10: qty 5

## 2020-03-10 MED ORDER — FENTANYL CITRATE (PF) 100 MCG/2ML IJ SOLN
50.0000 ug | Freq: Once | INTRAMUSCULAR | Status: DC
  Filled 2020-03-10: qty 2

## 2020-03-10 MED ORDER — OXYCODONE HCL 5 MG PO TABS: 10.0000 mg | ORAL_TABLET | ORAL | Status: DC | PRN

## 2020-03-10 MED ORDER — OXYCODONE HCL 5 MG PO TABS: 5.0000 mg | ORAL_TABLET | ORAL | Status: DC | PRN

## 2020-03-10 MED ORDER — HYDROMORPHONE HCL 1 MG/ML IJ SOLN
0.5000 mg | INTRAMUSCULAR | Status: DC | PRN
  Administered 2020-03-10: 0.5 mg via INTRAVENOUS
  Filled 2020-03-10: qty 1

## 2020-03-10 MED ORDER — ONDANSETRON HCL 4 MG PO TABS: 4.0000 mg | ORAL_TABLET | Freq: Four times a day (QID) | ORAL | Status: DC | PRN

## 2020-03-10 MED ORDER — VANCOMYCIN HCL 1000 MG IV SOLR
INTRAVENOUS | Status: AC
  Filled 2020-03-10: qty 1000

## 2020-03-10 MED ORDER — METHOCARBAMOL 500 MG PO TABS: 500.0000 mg | ORAL_TABLET | Freq: Four times a day (QID) | ORAL | Status: DC | PRN

## 2020-03-10 MED ORDER — METOCLOPRAMIDE HCL 5 MG PO TABS: 5.0000 mg | ORAL_TABLET | Freq: Three times a day (TID) | ORAL | Status: DC | PRN

## 2020-03-10 MED ORDER — KETOROLAC TROMETHAMINE 30 MG/ML IJ SOLN
INTRAMUSCULAR | Status: DC | PRN
Start: 2020-03-10 — End: 2020-03-10
  Administered 2020-03-10: 30 mg via INTRAVENOUS

## 2020-03-10 MED ORDER — KETOROLAC TROMETHAMINE 30 MG/ML IJ SOLN
INTRAMUSCULAR | Status: AC
  Filled 2020-03-10: qty 1

## 2020-03-10 MED ORDER — LIDOCAINE 2% (20 MG/ML) 5 ML SYRINGE
INTRAMUSCULAR | Status: DC | PRN
  Administered 2020-03-10: 80 mg via INTRAVENOUS

## 2020-03-10 MED ORDER — PHENOL 1.4 % MT LIQD: 1.0000 | OROMUCOSAL | Status: DC | PRN

## 2020-03-10 MED ORDER — MAGNESIUM CITRATE PO SOLN: 1.0000 | Freq: Once | ORAL | Status: DC | PRN

## 2020-03-10 MED ORDER — PHENYLEPHRINE HCL-NACL 10-0.9 MG/250ML-% IV SOLN
INTRAVENOUS | Status: DC | PRN
  Administered 2020-03-10: 40 ug/min via INTRAVENOUS

## 2020-03-10 MED ORDER — METHOCARBAMOL 500 MG IVPB - SIMPLE MED
500.0000 mg | Freq: Four times a day (QID) | INTRAVENOUS | Status: DC | PRN
  Filled 2020-03-10: qty 50

## 2020-03-10 MED ORDER — METOCLOPRAMIDE HCL 5 MG/ML IJ SOLN: 5.0000 mg | Freq: Three times a day (TID) | INTRAMUSCULAR | Status: DC | PRN

## 2020-03-10 MED ORDER — HYDROMORPHONE HCL 1 MG/ML IJ SOLN
INTRAMUSCULAR | Status: DC | PRN
  Administered 2020-03-10 (×2): .5 mg via INTRAVENOUS

## 2020-03-10 MED ORDER — KETOROLAC TROMETHAMINE 15 MG/ML IJ SOLN
7.5000 mg | Freq: Four times a day (QID) | INTRAMUSCULAR | Status: DC
  Administered 2020-03-10 – 2020-03-11 (×3): 7.5 mg via INTRAVENOUS
  Filled 2020-03-10 (×3): qty 1

## 2020-03-10 MED ORDER — FENTANYL CITRATE (PF) 100 MCG/2ML IJ SOLN
INTRAMUSCULAR | Status: AC
  Filled 2020-03-10: qty 2

## 2020-03-10 MED ORDER — MENTHOL 3 MG MT LOZG: 1.0000 | LOZENGE | OROMUCOSAL | Status: DC | PRN

## 2020-03-10 MED ORDER — DOCUSATE SODIUM 100 MG PO CAPS
100.0000 mg | ORAL_CAPSULE | Freq: Two times a day (BID) | ORAL | Status: DC
  Administered 2020-03-11: 100 mg via ORAL
  Filled 2020-03-10 (×2): qty 1

## 2020-03-10 MED ORDER — MIDAZOLAM HCL 2 MG/2ML IJ SOLN
1.0000 mg | Freq: Once | INTRAMUSCULAR | Status: DC
  Filled 2020-03-10: qty 2

## 2020-03-10 MED ORDER — CHLORHEXIDINE GLUCONATE 0.12 % MT SOLN
15.0000 mL | Freq: Once | OROMUCOSAL | Status: AC
  Administered 2020-03-10: 15 mL via OROMUCOSAL

## 2020-03-10 MED ORDER — POLYETHYLENE GLYCOL 3350 17 G PO PACK: 17.0000 g | PACK | Freq: Every day | ORAL | Status: DC | PRN

## 2020-03-10 MED ORDER — PROMETHAZINE HCL 25 MG/ML IJ SOLN: 6.2500 mg | INTRAMUSCULAR | Status: DC | PRN

## 2020-03-10 MED ORDER — ROCURONIUM BROMIDE 10 MG/ML (PF) SYRINGE
PREFILLED_SYRINGE | INTRAVENOUS | Status: DC | PRN
  Administered 2020-03-10: 50 mg via INTRAVENOUS

## 2020-03-10 MED ORDER — ALUM & MAG HYDROXIDE-SIMETH 200-200-20 MG/5ML PO SUSP: 30.0000 mL | ORAL | Status: DC | PRN

## 2020-03-10 MED ORDER — SUGAMMADEX SODIUM 500 MG/5ML IV SOLN
INTRAVENOUS | Status: AC
  Filled 2020-03-10: qty 5

## 2020-03-10 MED ORDER — 0.9 % SODIUM CHLORIDE (POUR BTL) OPTIME
TOPICAL | Status: DC | PRN
  Administered 2020-03-10: 1000 mL

## 2020-03-10 MED ORDER — ALBUTEROL SULFATE HFA 108 (90 BASE) MCG/ACT IN AERS
INHALATION_SPRAY | RESPIRATORY_TRACT | Status: AC
  Filled 2020-03-10: qty 6.7

## 2020-03-10 MED ORDER — ROCURONIUM BROMIDE 10 MG/ML (PF) SYRINGE
PREFILLED_SYRINGE | INTRAVENOUS | Status: AC
  Filled 2020-03-10: qty 10

## 2020-03-10 MED ORDER — ACETAMINOPHEN 500 MG PO TABS
1000.0000 mg | ORAL_TABLET | Freq: Once | ORAL | Status: AC
  Administered 2020-03-10: 1000 mg via ORAL
  Filled 2020-03-10: qty 2

## 2020-03-10 MED ORDER — DIPHENHYDRAMINE HCL 12.5 MG/5ML PO ELIX: 12.5000 mg | ORAL_SOLUTION | ORAL | Status: DC | PRN

## 2020-03-10 MED ORDER — PROPOFOL 10 MG/ML IV BOLUS
INTRAVENOUS | Status: AC
  Filled 2020-03-10: qty 20

## 2020-03-10 MED ORDER — CEFAZOLIN SODIUM-DEXTROSE 2-4 GM/100ML-% IV SOLN
2.0000 g | INTRAVENOUS | Status: AC
  Administered 2020-03-10: 2 g via INTRAVENOUS
  Filled 2020-03-10: qty 100

## 2020-03-10 MED ORDER — DEXAMETHASONE SODIUM PHOSPHATE 10 MG/ML IJ SOLN
INTRAMUSCULAR | Status: DC | PRN
  Administered 2020-03-10: 10 mg via INTRAVENOUS

## 2020-03-10 MED ORDER — HYDROMORPHONE HCL 2 MG/ML IJ SOLN
INTRAMUSCULAR | Status: AC
  Filled 2020-03-10: qty 1

## 2020-03-10 MED ORDER — ACETAMINOPHEN 325 MG PO TABS: 325.0000 mg | ORAL_TABLET | Freq: Four times a day (QID) | ORAL | Status: DC | PRN

## 2020-03-10 MED ORDER — PANTOPRAZOLE SODIUM 40 MG PO TBEC
40.0000 mg | DELAYED_RELEASE_TABLET | Freq: Every day | ORAL | Status: DC
  Administered 2020-03-11: 40 mg via ORAL
  Filled 2020-03-10: qty 1

## 2020-03-10 MED ORDER — ORAL CARE MOUTH RINSE: 15.0000 mL | Freq: Once | OROMUCOSAL | Status: AC

## 2020-03-10 MED ORDER — PHENYLEPHRINE HCL (PRESSORS) 10 MG/ML IV SOLN
INTRAVENOUS | Status: AC
  Filled 2020-03-10: qty 1

## 2020-03-10 MED ORDER — TRANEXAMIC ACID-NACL 1000-0.7 MG/100ML-% IV SOLN
1000.0000 mg | INTRAVENOUS | Status: AC
  Administered 2020-03-10: 1000 mg via INTRAVENOUS
  Filled 2020-03-10: qty 100

## 2020-03-10 MED ORDER — FENTANYL CITRATE (PF) 100 MCG/2ML IJ SOLN
25.0000 ug | INTRAMUSCULAR | Status: DC | PRN
  Administered 2020-03-10: 50 ug via INTRAVENOUS

## 2020-03-10 MED ORDER — BUPIVACAINE HCL (PF) 0.25 % IJ SOLN
INTRAMUSCULAR | Status: AC
  Filled 2020-03-10: qty 30

## 2020-03-10 SURGICAL SUPPLY — 76 items
BAG ZIPLOCK 12X15 (MISCELLANEOUS) ×3 IMPLANT
BLADE SAW SGTL 83.5X18.5 (BLADE) ×3 IMPLANT
CEMENT BONE DEPUY (Cement) ×2 IMPLANT
CEMENT RESTRICTOR DEPUY SZ 1 (Cement) ×2 IMPLANT
COOLER ICEMAN CLASSIC (MISCELLANEOUS) ×2 IMPLANT
COVER BACK TABLE 60X90IN (DRAPES) ×3 IMPLANT
COVER SURGICAL LIGHT HANDLE (MISCELLANEOUS) ×3 IMPLANT
COVER WAND RF STERILE (DRAPES) ×2 IMPLANT
CUP SUT UNIV REVERS 36 NEUTRAL (Cup) ×2 IMPLANT
DERMABOND ADVANCED (GAUZE/BANDAGES/DRESSINGS) ×2
DERMABOND ADVANCED .7 DNX12 (GAUZE/BANDAGES/DRESSINGS) ×1 IMPLANT
DRAPE INCISE IOBAN 66X45 STRL (DRAPES) IMPLANT
DRAPE ORTHO SPLIT 77X108 STRL (DRAPES) ×6
DRAPE SHEET LG 3/4 BI-LAMINATE (DRAPES) ×3 IMPLANT
DRAPE SURG 17X11 SM STRL (DRAPES) ×3 IMPLANT
DRAPE SURG ORHT 6 SPLT 77X108 (DRAPES) ×2 IMPLANT
DRAPE U-SHAPE 47X51 STRL (DRAPES) ×3 IMPLANT
DRESSING AQUACEL AG SP 3.5X10 (GAUZE/BANDAGES/DRESSINGS) IMPLANT
DRSG AQUACEL AG ADV 3.5X10 (GAUZE/BANDAGES/DRESSINGS) ×3 IMPLANT
DRSG AQUACEL AG SP 3.5X10 (GAUZE/BANDAGES/DRESSINGS) ×3
DURAPREP 26ML APPLICATOR (WOUND CARE) ×3 IMPLANT
ELECT BLADE TIP CTD 4 INCH (ELECTRODE) ×3 IMPLANT
ELECT REM PT RETURN 15FT ADLT (MISCELLANEOUS) ×3 IMPLANT
FACESHIELD WRAPAROUND (MASK) ×12 IMPLANT
FACESHIELD WRAPAROUND OR TEAM (MASK) ×4 IMPLANT
GLENOID UNI REV MOD 24 +2 LAT (Joint) ×2 IMPLANT
GLENOSPHERE 36 +4 LAT/24 (Joint) ×2 IMPLANT
GLOVE BIO SURGEON STRL SZ7.5 (GLOVE) ×3 IMPLANT
GLOVE BIO SURGEON STRL SZ8 (GLOVE) ×3 IMPLANT
GLOVE SS BIOGEL STRL SZ 7 (GLOVE) ×1 IMPLANT
GLOVE SS BIOGEL STRL SZ 7.5 (GLOVE) ×1 IMPLANT
GLOVE SUPERSENSE BIOGEL SZ 7 (GLOVE) ×2
GLOVE SUPERSENSE BIOGEL SZ 7.5 (GLOVE) ×2
GLOVE SURG SYN 7.0 (GLOVE) IMPLANT
GLOVE SURG SYN 7.0 PF PI (GLOVE) IMPLANT
GLOVE SURG SYN 7.5  E (GLOVE)
GLOVE SURG SYN 7.5 E (GLOVE) IMPLANT
GLOVE SURG SYN 7.5 PF PI (GLOVE) IMPLANT
GLOVE SURG SYN 8.0 (GLOVE) IMPLANT
GLOVE SURG SYN 8.0 PF PI (GLOVE) IMPLANT
GOWN STRL REUS W/TWL LRG LVL3 (GOWN DISPOSABLE) ×6 IMPLANT
KIT BASIN (CUSTOM PROCEDURE TRAY) ×3 IMPLANT
KIT TURNOVER KIT A (KITS) IMPLANT
LINER HUMERAL 36 +3MM SM (Shoulder) ×2 IMPLANT
MANIFOLD NEPTUNE II (INSTRUMENTS) ×3 IMPLANT
NDL TAPERED W/ NITINOL LOOP (MISCELLANEOUS) ×1 IMPLANT
NEEDLE TAPERED W/ NITINOL LOOP (MISCELLANEOUS) ×3 IMPLANT
NS IRRIG 1000ML POUR BTL (IV SOLUTION) ×3 IMPLANT
PACK SHOULDER (CUSTOM PROCEDURE TRAY) ×3 IMPLANT
PAD ARMBOARD 7.5X6 YLW CONV (MISCELLANEOUS) ×3 IMPLANT
PAD COLD SHLDR WRAP-ON (PAD) ×2 IMPLANT
PIN SET MODULAR GLENOID SYSTEM (PIN) ×4 IMPLANT
RESTRAINT HEAD UNIVERSAL NS (MISCELLANEOUS) ×3 IMPLANT
SCREW CENTRAL MODULAR 25 (Screw) ×2 IMPLANT
SCREW PERI LOCK 5.5X16 (Screw) ×2 IMPLANT
SCREW PERI LOCK 5.5X24 (Screw) ×4 IMPLANT
SCREW PERIPHERAL 5.5X28 LOCK (Screw) ×2 IMPLANT
SLING ARM FOAM STRAP LRG (SOFTGOODS) IMPLANT
SLING ARM FOAM STRAP MED (SOFTGOODS) ×2 IMPLANT
SPACER SHLD UNI REV 36 +6 (Shoulder) ×1 IMPLANT
SPACER TI 36/+6MM (Shoulder) ×1 IMPLANT
SPONGE LAP 18X18 RF (DISPOSABLE) IMPLANT
STEM HUMERAL MOD SZ 5 135 DEG (Stem) ×2 IMPLANT
SUCTION FRAZIER HANDLE 12FR (TUBING) ×3
SUCTION TUBE FRAZIER 12FR DISP (TUBING) ×1 IMPLANT
SUT FIBERWIRE #2 38 T-5 BLUE (SUTURE)
SUT MNCRL AB 3-0 PS2 18 (SUTURE) ×3 IMPLANT
SUT MON AB 2-0 CT1 36 (SUTURE) ×3 IMPLANT
SUT VIC AB 1 CT1 36 (SUTURE) ×3 IMPLANT
SUTURE FIBERWR #2 38 T-5 BLUE (SUTURE) IMPLANT
SUTURE TAPE 1.3 40 TPR END (SUTURE) ×2 IMPLANT
SUTURETAPE 1.3 40 TPR END (SUTURE) ×6
TOWEL OR 17X26 10 PK STRL BLUE (TOWEL DISPOSABLE) ×3 IMPLANT
TOWEL OR NON WOVEN STRL DISP B (DISPOSABLE) ×3 IMPLANT
WATER STERILE IRR 1000ML POUR (IV SOLUTION) ×6 IMPLANT
YANKAUER SUCT BULB TIP 10FT TU (MISCELLANEOUS) ×3 IMPLANT

## 2020-03-10 NOTE — Plan of Care (Signed)
  Problem: Education: Goal: Knowledge of General Education information will improve Description: Including pain rating scale, medication(s)/side effects and non-pharmacologic comfort measures 03/10/2020 1408 by Natelie Ostrosky, Helane Gunther, RN Outcome: Progressing 03/10/2020 1408 by Deetta Perla, RN Outcome: Progressing   Problem: Health Behavior/Discharge Planning: Goal: Ability to manage health-related needs will improve 03/10/2020 1408 by Chalise Pe, Helane Gunther, RN Outcome: Progressing 03/10/2020 1408 by Deetta Perla, RN Outcome: Progressing   Problem: Clinical Measurements: Goal: Ability to maintain clinical measurements within normal limits will improve 03/10/2020 1408 by Sophiya Morello, Helane Gunther, RN Outcome: Progressing 03/10/2020 1408 by Deetta Perla, RN Outcome: Progressing Goal: Will remain free from infection 03/10/2020 1408 by Deetta Perla, RN Outcome: Progressing 03/10/2020 1408 by Deetta Perla, RN Outcome: Progressing Goal: Diagnostic test results will improve 03/10/2020 1408 by Deetta Perla, RN Outcome: Progressing 03/10/2020 1408 by Deetta Perla, RN Outcome: Progressing Goal: Respiratory complications will improve 03/10/2020 1408 by Deetta Perla, RN Outcome: Progressing 03/10/2020 1408 by Deetta Perla, RN Outcome: Progressing Goal: Cardiovascular complication will be avoided 03/10/2020 1408 by Deetta Perla, RN Outcome: Progressing 03/10/2020 1408 by Deetta Perla, RN Outcome: Progressing   Problem: Activity: Goal: Risk for activity intolerance will decrease 03/10/2020 1408 by Lariah Fleer, Helane Gunther, RN Outcome: Progressing 03/10/2020 1408 by Deetta Perla, RN Outcome: Progressing   Problem: Nutrition: Goal: Adequate nutrition will be maintained 03/10/2020 1408 by Deetta Perla, RN Outcome: Progressing 03/10/2020 1408 by Deetta Perla, RN Outcome: Progressing   Problem: Coping: Goal: Level of anxiety will decrease 03/10/2020 1408 by Deetta Perla, RN Outcome: Progressing 03/10/2020 1408 by Deetta Perla, RN Outcome: Progressing   Problem: Elimination: Goal: Will not experience complications related to bowel motility 03/10/2020 1408 by Deetta Perla, RN Outcome: Progressing 03/10/2020 1408 by Deetta Perla, RN Outcome: Progressing Goal: Will not experience complications related to urinary retention 03/10/2020 1408 by Deetta Perla, RN Outcome: Progressing 03/10/2020 1408 by Deetta Perla, RN Outcome: Progressing   Problem: Pain Managment: Goal: General experience of comfort will improve 03/10/2020 1408 by Deetta Perla, RN Outcome: Progressing 03/10/2020 1408 by Deetta Perla, RN Outcome: Progressing   Problem: Safety: Goal: Ability to remain free from injury will improve 03/10/2020 1408 by Zackerie Sara, Helane Gunther, RN Outcome: Progressing 03/10/2020 1408 by Deetta Perla, RN Outcome: Progressing   Problem: Skin Integrity: Goal: Risk for impaired skin integrity will decrease 03/10/2020 1408 by Deetta Perla, RN Outcome: Progressing 03/10/2020 1408 by Deetta Perla, RN Outcome: Progressing   Problem: Education: Goal: Knowledge of the prescribed therapeutic regimen will improve Outcome: Progressing Goal: Understanding of activity limitations/precautions following surgery will improve Outcome: Progressing Goal: Individualized Educational Video(s) Outcome: Progressing   Problem: Activity: Goal: Ability to tolerate increased activity will improve Outcome: Progressing   Problem: Pain Management: Goal: Pain level will decrease with appropriate interventions Outcome: Progressing

## 2020-03-10 NOTE — Op Note (Signed)
03/10/2020  11:52 AM  PATIENT:   Brittany Marks  79 y.o. female  PRE-OPERATIVE DIAGNOSIS:  Left proximal humerus pathologic fracture  POST-OPERATIVE DIAGNOSIS: Same  PROCEDURE: Left shoulder reverse arthroplasty utilizing a cemented size 5.5 Arthrex stem with a +6 spacer, +3 polyethylene insert, 36/+4 glenosphere on a small/+2 baseplate  SURGEON:  Allyana Vogan, Metta Clines M.D.  ASSISTANTS: Jenetta Loges, PA-C  ANESTHESIA:   General endotracheal with local in the periincisional soft tissues  EBL: 150 cc  SPECIMEN: Tissue from the pathologic fracture site was sent for routine pathology  Drains: None   PATIENT DISPOSITION:  PACU - hemodynamically stable.    PLAN OF CARE: Admit for overnight observation  Brief history:  Brittany Marks is a 79 year old female with an extensive and complex past medical history which includes metastatic lung cancer who has had persistent pain and progressively increasing functional limitation secondary to a pathologic fracture of the left proximal humerus.  We have been following her for over a year and her symptoms continued to deteriorate.  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  Preop I counseled Brittany Marks regarding treatment options as well as the potential risks versus benefits thereof.  Possible surgical complications were reviewed including bleeding, infection, neurovascular injury, persistent pain, failure the implant, anesthetic complication, and possible need for additional surgery.  Additionally we discussed with her that all of her medical providers identify that she is at increased risk for perioperative morbidity and/or mortality related to her underlying medical comorbidities.  She understands, and accepts, and agrees with our planned procedure.  Procedure in detail:  After undergoing extensive preoperative  evaluation patient received prophylactic antibiotics and was brought to the operating placed prone on the operative table and underwent the smooth induction of a general endotracheal anesthesia.  Subsequently placed into the beachchair position and appropriately padded protected.  The left shoulder girdle region was sterilely prepped and draped in standard fashion.  Timeout was called.  An anterior deltopectoral approach left shoulder is made through an 8 cm incision.  Skin flaps were elevated dissection carried deeply and electrocautery was used for hemostasis.  The deltopectoral interval was then opened from proximal to distal with the vein taken laterally.  Conjoined tendon mobilized retracted medially.  Adhesions divided from beneath the deltoid.  The tumor mass was noted at the humeral metaphyseal region and as we performed a dissection distally a portion of the tumor material was sent for routine pathology.  There was complete absence of the rotator cuff superiorly.  The remnant of the subscapularis was divided and allowed to retract medially.  I then gained circumferential exposure around the humeral head and some remnants of the rotator cuff were divided.  The head was quite friable and ultimately was removed piecemeal.  With the head excised we then addressed the glenoid where we performed a circumferential labral resection.  A guidepin was then directed into the center of the glenoid and this was then reamed with a central followed by peripheral reamer.  Our central drill hole was then drilled and tapped and the final baseplate was assembled and introduced after it had been coated with vancomycin powder achieving excellent fit and fixation.  The peripheral locking screws were all then placed using standard technique and again were dipped in vancomycin powder prior to insertion.  The 36/+4 glenosphere was then impacted onto the baseplate and  the central locking screw was in place.  This point we returned  our attention back to the humeral shaft.  The canal was opened and we did debulk some additional tumor from the area.  The canal was then hand reamed up to a size 7 which did not achieve significant distal seating.  We worked to remove as much metaphyseal bone as possible such that we could gain appropriate seating for a 5.5 stem and also achieved an appropriate cement mantle.  We then broached ultimately with a 5.5 stem at approximately 20 degrees of retroversion.  With this we were able to achieve a successful reduction of the implant on the glenosphere.  The trial was then removed and we placed a distal cement plug at the appropriate level.  The canal was then copes irrigated cleaned and dried.  Cement was mixed with a small portion of the vancomycin powder added.  We then used the cement syringe to introduce the cement into the humeral canal and pressurized the canal.  Our final implant was assembled the size 5.5 with a neutral metaphysis.  The implant was then inserted at 20 degrees retroversion and appropriately impacted achieving good stable fixation and all extra cement was carefully removed.  Once the cement hardened we then performed a series of trial reductions and ultimately Marks that +9 off the implant gave [place] the best soft tissue balance.  At this point the trials were removed a +6 spacer and a +3 polywere then affixed to the stem.  The final reduction was performed and this showed excellent soft tissue balance and good motion good stability.  The wounds and copes irrigated.  The deltopectoral interval was reapproximated a series of figure-of-eight and 1 Vicryl sutures.  2-0 Monocryl used in the subcu layer and intracuticular 3 Monocryl for the skin followed by Dermabond and Aquacel dressing.  Left arm placed in sling.  Patient was awakened, extubated, and taken to the recovery in stable addition.  Jenetta Loges, PA-C was utilized as an Environmental consultant throughout this case, essential for help with  positioning the patient, positioning extremity, tissue manipulation, implantation of the prosthesis, suture management, wound closure, and intraoperative decision-making.  Marin Shutter MD     Contact # [phone]

## 2020-03-10 NOTE — Discharge Instructions (Signed)
Brittany Marks. Supple, M.D., F.A.A.O.S. Orthopaedic Surgery Specializing in Arthroscopic and Reconstructive Surgery of the Shoulder [phone] [address] [place], [place] [address] - Fax [phone]   POST-OP TOTAL SHOULDER REPLACEMENT INSTRUCTIONS  1. Follow up in the office for your first post-op appointment 10-14 days from the date of your surgery. If you do not already have a scheduled appointment, our office will contact you to schedule.  2. The bandage over your incision is waterproof. You may begin showering with this dressing on. You may leave this dressing on until first follow up appointment within 2 weeks. We prefer you leave this dressing in place until follow up however after 5-7 days if you are having itching or skin irritation and would like to remove it you may do so. Go slow and tug at the borders gently to break the bond the dressing has with the skin. At this point if there is no drainage it is okay to go without a bandage or you may cover it with a light guaze and tape. You can also expect significant bruising around your shoulder that will drift down your arm and into your chest wall. This is very normal and should resolve over several days.   3. Wear your sling/immobilizer at all times except to perform the exercises below or to occasionally let your arm dangle by your side to stretch your elbow. You also need to sleep in your sling immobilizer until instructed otherwise. It is ok to remove your sling if you are sitting in a controlled environment and allow your arm to rest in a position of comfort by your side or on your lap with pillows to give your neck and skin a break from the sling. You may remove it to allow arm to dangle by side to shower. If you are up walking around and when you go to sleep at night you need to wear it.  4. Range of motion to your elbow, wrist, and hand are encouraged 3-5 times daily. Exercise to your hand and fingers helps to reduce  swelling you may experience.   5. Prescriptions for a pain medication and a muscle relaxant are provided for you. It is recommended that if you are experiencing pain that you pain medication alone is not controlling, add the muscle relaxant along with the pain medication which can give additional pain relief. The first 1-2 days is generally the most severe of your pain and then should gradually decrease. As your pain lessens it is recommended that you decrease your use of the pain medications to an "as needed basis'" only and to always comply with the recommended dosages of the pain medications.  6. Pain medications can produce constipation along with their use. If you experience this, the use of an over the counter stool softener or laxative daily is recommended.   7. For additional questions or concerns, please do not hesitate to call the office. If after hours there is an answering service to forward your concerns to the physician on call.  8.Pain control following an exparel block  To help control your post-operative pain you received a nerve block  performed with Exparel which is a long acting anesthetic (numbing agent) which can provide pain relief and sensations of numbness (and relief of pain) in the operative shoulder and arm for up to 3 days. Sometimes it provides mixed relief, meaning you may still have numbness in certain areas of the arm but can still be able to  move  parts of that arm, hand, and fingers. We recommend that your prescribed pain medications  be used as needed. We do not feel it is necessary to "pre medicate" and "stay ahead" of pain.  Taking narcotic pain medications when you are not having any pain can lead to unnecessary and potentially dangerous side effects.    9. Use the ice machine as much as possible in the first 5-7 days from surgery, then you can wean its use to as needed. The ice typically needs to be replaced every 6 hours, instead of ice you can actually freeze  water bottles to put in the cooler and then fill water around them to avoid having to purchase ice. You can have spare water bottles freezing to allow you to rotate them once they have melted. Try to have a thin shirt or light cloth or towel under the ice wrap to protect your skin.   10.  We recommend that you avoid any dental work or cleaning in the first 3 months following your joint replacement. This is to help minimize the possibility of infection from the bacteria in your mouth that enters your bloodstream during dental work. We also recommend that you take an antibiotic prior to your dental work for the first year after your shoulder replacement to further help reduce that risk. Please simply contact our office for antibiotics to be sent to your pharmacy prior to dental work.  11. Dental Antibiotics:  In most cases prophylactic antibiotics for Dental procdeures after total joint surgery are not necessary.  Exceptions are as follows:  1. History of prior total joint infection  2. Severely immunocompromised (Organ Transplant, cancer chemotherapy, Rheumatoid biologic meds such as Rowan)  3. Poorly controlled diabetes (A1C &gt; 8.0, blood glucose over 200)  If you have one of these conditions, contact your surgeon for an antibiotic prescription, prior to your dental procedure.   POST-OP EXERCISES  Pendulum Exercises  Perform pendulum exercises while standing and bending at the waist. Support your uninvolved arm on a table or chair and allow your operated arm to hang freely. Make sure to do these exercises passively - not using you shoulder muscles. These exercises can be performed once your nerve block effects have worn off.  Repeat 20 times. Do 3 sessions per day.

## 2020-03-10 NOTE — H&P (Signed)
Brittany Marks    Chief Complaint: Left proximal humerus pathologic fracture HPI: The patient is a 79 y.o. 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  Her symptoms have been progressively increasing and due to her significant pain and functional mentation she is brought to the operating this time for planned left shoulder reverse arthroplasty.  Preoperatively had counseled Brittany Marks regarding treatment options as well as the potential risks versus benefits thereof.  We had a lengthy discussion regarding quality of life issues.  She understands, and accepts, and wishes to proceed with planned left shoulder surgery.  Past Medical History:  Diagnosis Date   Cataract    Bilateral   Constipation    pain medication   Left rotator cuff tear    Right rotator cuff tear    SCL CA dx'd 11/2018    Past Surgical History:  Procedure Laterality Date   CATARACT EXTRACTION W/ INTRAOCULAR LENS  IMPLANT, BILATERAL  2012   DECOMPRESSIVE LUMBAR LAMINECTOMY LEVEL 2 N/A 07/29/2013   Procedure: LUMBAR LAMINECTOMY, DECOMPRESSION LUMBAR THREE TO FOUR, FOUR TO FIVE microdiscectomy l3,4 right;  Surgeon: Tobi Bastos, MD;  Location: [facility];  Service: Orthopedics;  Laterality: N/A;   I & D SUPERIOR RIGHT SHOULDER AND CLOSURE WOUND  01-10-2011   S/P ROTATOR CUFF REPAIR   IR IMAGING GUIDED PORT INSERTION  04/09/2019   LUMBAR LAMINECTOMY  1970'S   LUMBAR LAMINECTOMY/DECOMPRESSION MICRODISCECTOMY N/A 06/27/2016   Procedure: L1 - L2 DISCECTOMY;  Surgeon: Melina Schools, MD;  Location: [place];  Service: Orthopedics;  Laterality: N/A;   LUMBAR SPINE SURGERY  1983   RIGHT SHOULDER ARTHROSCOPY/ OPEN DISTAL CLAVICLE RESECTION/ SAD/  OPEN ROTATOR CUFF REPAIR  11-28-2010   SHOULDER OPEN ROTATOR CUFF REPAIR  12/19/2011   Procedure: ROTATOR CUFF REPAIR SHOULDER OPEN;  Surgeon: Magnus Sinning, MD;  Location: [facility];  Service: Orthopedics;  Laterality: Right;  RIGHT RECURRENT OPEN REPAIR OF THE ROTATOR CUFF WITH TISSUE MEND GRAFTANTERIOR CHROMIOECTOMY   VAGINAL HYSTERECTOMY  1979    Family History  Problem Relation Age of Onset   Congestive Heart Failure Mother    Congestive Heart Failure Father     Social History:  reports that she has been smoking cigarettes. She has a 12.50 pack-year smoking history. She has never used smokeless tobacco. She reports current alcohol use. She reports that she does not use drugs.   Medications Prior to Admission  Medication Sig Dispense Refill   Ascorbic Acid (VITAMIN C PO) Take by mouth daily.      b complex vitamins tablet Take 1 tablet by mouth daily.     COD LIVER OIL PO Take 1 tablet by mouth daily.      hydrocortisone 2.5 % cream Apply topically as needed. (Patient taking differently: Apply 1 application topically daily as needed (Treatment). ) 3.5 g 0   ibuprofen (ADVIL) 200 MG tablet Take 600 mg by mouth daily.     lidocaine-prilocaine (EMLA) cream Apply 1 application topically every 30 (thirty) days.      Multiple Vitamin (MULTIVITAMIN) tablet Take 1 tablet by mouth daily.     oxymetazoline (AFRIN) 0.05 % nasal spray Place 2 sprays into the nose 2 (two) times daily as needed (Bloody nose).  sodium chloride (OCEAN) 0.65 % nasal spray Place 1 spray into the nose at bedtime.      VITAMIN E PO Take 1 tablet by mouth daily.      prochlorperazine (COMPAZINE) 10 MG tablet Take 1 tablet (10 mg total) by mouth every 6 (six) hours as needed for nausea or vomiting. 30 tablet 0     Physical Exam: Inspection of the left shoulder demonstrates the skin to be intact with no areas of ecchymosis, erythema, or induration.  She has tenderness palpation over  the humeral metaphyseal region.  She has a profound loss of mobility with the inability to elevate the arm away from her side due to pain and instability at her fracture site.  She is otherwise grossly neurovascularly intact.  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  Additionally, she has underlying severe rotator cuff tear arthropathy with a high riding humeral head and complete loss of joint space.  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  Plan of Action: Procedure(s): REVERSE SHOULDER ARTHROPLASTY  Brittany Marks M Brittany Marks 03/10/2020, 9:07 AM Contact # [phone]

## 2020-03-10 NOTE — Anesthesia Procedure Notes (Signed)
Procedure Name: Intubation Date/Time: 03/10/2020 10:03 AM Performed by: [place] Laurna Shetley C, CRNA Pre-anesthesia Checklist: Patient identified, Emergency Drugs available, Suction available and Patient being monitored Patient Re-evaluated:Patient Re-evaluated prior to induction Oxygen Delivery Method: Circle system utilized Preoxygenation: Pre-oxygenation with 100% oxygen Induction Type: IV induction Ventilation: Mask ventilation without difficulty Laryngoscope Size: Mac and 3 Grade View: Grade I Tube type: Oral Tube size: 7.0 mm Number of attempts: 1 Airway Equipment and Method: Stylet and Oral airway Placement Confirmation: ETT inserted through vocal cords under direct vision,  positive ETCO2 and breath sounds checked- equal and bilateral Tube secured with: Tape Dental Injury: Teeth and Oropharynx as per pre-operative assessment

## 2020-03-10 NOTE — Transfer of Care (Signed)
Immediate Anesthesia Transfer of Care Note  Patient: Brittany Marks  Procedure(s) Performed: REVERSE SHOULDER ARTHROPLASTY (Left Shoulder)  Patient Location: PACU  Anesthesia Type:General  Level of Consciousness: awake, alert  and oriented  Airway & Oxygen Therapy: Patient Spontanous Breathing and Patient connected to face mask oxygen  Post-op Assessment: Report given to RN and Post -op Vital signs reviewed and stable  Post vital signs: Reviewed and stable  Last Vitals:  Vitals Value Taken Time  BP 174/86 03/10/20 1215  Temp    Pulse 98 03/10/20 1217  Resp 16 03/10/20 1217  SpO2 100 % 03/10/20 1217  Vitals shown include unvalidated device data.  Last Pain:  Vitals:   03/10/20 0752  TempSrc:   PainSc: 0-No pain         Complications: No apparent anesthesia complications

## 2020-03-11 ENCOUNTER — Encounter: Payer: Self-pay | Admitting: *Deleted

## 2020-03-11 DIAGNOSIS — M84422A Pathological fracture, left humerus, initial encounter for fracture: Secondary | ICD-10-CM | POA: Diagnosis not present

## 2020-03-11 HISTORY — PX: SHOULDER FUSION SURGERY: SHX775

## 2020-03-11 LAB — SURGICAL PATHOLOGY

## 2020-03-11 MED ORDER — CYCLOBENZAPRINE HCL 10 MG PO TABS
10.0000 mg | ORAL_TABLET | Freq: Three times a day (TID) | ORAL | 1 refills | Status: DC | PRN
Start: 2020-03-11 — End: 2022-05-14

## 2020-03-11 MED ORDER — HYDROCODONE-ACETAMINOPHEN 5-325 MG PO TABS: 1.0000 | ORAL_TABLET | Freq: Four times a day (QID) | ORAL | 0 refills | Status: DC | PRN

## 2020-03-11 MED ORDER — ONDANSETRON HCL 4 MG PO TABS: 4.0000 mg | ORAL_TABLET | Freq: Three times a day (TID) | ORAL | 0 refills | Status: DC | PRN

## 2020-03-11 NOTE — TOC Initial Note (Addendum)
Transition of Care [facility]) - Initial/Assessment Note    Patient Details  Name: Brittany Marks MRN: [id number] Date of Birth: [birth year]  [facility]) CM/SW Contact:    Leeroy Cha, RN Phone Number: 03/11/2020, 11:17 AM  Clinical Narrative:                 hhc through kah from pt and ot. kah called back and said can not handle due to staffing/ sent request to [facility] and was accepted. Expected Discharge Plan: [place] Barriers to Discharge: No Barriers Identified   Patient Goals and CMS Choice Patient states their goals for this hospitalization and ongoing recovery are:: to go home CMS Medicare.gov Compare Post Acute Care list provided to:: Patient Choice offered to / list presented to : Patient  Expected Discharge Plan and Services Expected Discharge Plan: [place]   Discharge Planning Services: CM Consult Post Acute Care Choice: [place] arrangements for the past 2 months: Single Family Home Expected Discharge Date: 03/11/20                         HH Arranged: PT, OT [place] Agency: [facility] [facility] [employer] Date [place]: 03/11/20 Time [place]: 62 Representative spoke with at [place]: [place] Arrangements/Services Living arrangements for the past 2 months: [place] with:: Self Patient language and need for interpreter reviewed:: No Do you feel safe going back to the place where you live?: Yes      Need for Family Participation in Patient Care: Yes (Comment) Care giver support system in place?: Yes (comment)   Criminal Activity/Legal Involvement Pertinent to Current Situation/Hospitalization: No - Comment as needed  Activities of Daily Living Home Assistive Devices/Equipment: Eyeglasses, Dentures (specify type) ADL Screening (condition at time of admission) Patient's cognitive ability adequate to safely complete daily  activities?: Yes Is the patient deaf or have difficulty hearing?: No Does the patient have difficulty seeing, even when wearing glasses/contacts?: No Does the patient have difficulty concentrating, remembering, or making decisions?: No Patient able to express need for assistance with ADLs?: Yes Does the patient have difficulty dressing or bathing?: No Independently performs ADLs?: Yes (appropriate for developmental age) Does the patient have difficulty walking or climbing stairs?: Yes Weakness of Legs: Right Weakness of Arms/Hands: Both  Permission Sought/Granted                  Emotional Assessment Appearance:: Appears stated age Attitude/Demeanor/Rapport: Engaged Affect (typically observed): Calm Orientation: : Oriented to Self, Oriented to Place, Oriented to  Time, Oriented to Situation Alcohol / Substance Use: Not Applicable Psych Involvement: No (comment)  Admission diagnosis:  S/P reverse total shoulder arthroplasty, left [U72.536] Patient Active Problem List   Diagnosis Date Noted  . S/P reverse total shoulder arthroplasty, 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  . Port-A-Cath in place 05/25/2019  . 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 Small cell lung cancer, right upper lobe ([place]) 03/10/2019  . Goals of care, counseling/discussion 03/10/2019  . Encounter for antineoplastic chemotherapy 03/10/2019  . Encounter for antineoplastic immunotherapy 03/10/2019  . Pathologic fracture of right humerus 02/25/2019  . Cancer ([place]) 02/25/2019  . Lymphadenopathy, abdominal 01/02/2019  . Lymphadenopathy, mediastinal 01/02/2019  . Pathological fracture, left humerus, initial encounter for fracture 12/26/2018  . 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  . Spinal stenosis, lumbar region, with neurogenic  claudication 07/29/2013  . 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   PCP:  Default, Provider, MD Pharmacy:   [facility] [place], [place] - [place] [address] [place] [place] [address] Phone: [phone] Fax: [phone]     Social Determinants of Health ([place]) Interventions    Readmission Risk Interventions No flowsheet data found.

## 2020-03-11 NOTE — Anesthesia Postprocedure Evaluation (Signed)
Anesthesia Post Note  Patient: Brittany Marks  Procedure(s) Performed: REVERSE SHOULDER ARTHROPLASTY (Left Shoulder)     Patient location during evaluation: PACU Anesthesia Type: General Level of consciousness: awake and alert and oriented Pain management: pain level controlled Vital Signs Assessment: post-procedure vital signs reviewed and stable Respiratory status: spontaneous breathing, nonlabored ventilation and respiratory function stable Cardiovascular status: blood pressure returned to baseline Postop Assessment: no apparent nausea or vomiting Anesthetic complications: no                  Brennan Bailey

## 2020-03-11 NOTE — Progress Notes (Signed)
PT Cancellation Note  Patient Details Name: Brittany Marks MRN: [id number] DOB: 1941-06-06   Cancelled Treatment:    Reason Eval/Treat Not Completed: PT screened, no needs identified, will sign off. amb without difficulty per OT   [facility] 03/11/2020, 9:49 AM

## 2020-03-11 NOTE — Discharge Summary (Signed)
PATIENT ID:      Brittany Marks  MRN:     [id number] DOB/AGE:    1941-03-03 / 79 y.o.     DISCHARGE SUMMARY  ADMISSION DATE:    03/10/2020 DISCHARGE DATE:    ADMISSION DIAGNOSIS: Left proximal humerus pathologic fracture Past Medical History:  Diagnosis Date  . Cataract    Bilateral  . Constipation    pain medication  . Left rotator cuff tear   . Right rotator cuff tear   . SCL CA dx'd 11/2018    DISCHARGE DIAGNOSIS:   Active Problems:   S/P reverse total shoulder arthroplasty, left   PROCEDURE: Procedure(s): REVERSE SHOULDER ARTHROPLASTY on 03/10/2020  CONSULTS:    HISTORY:  See H&P in chart.  HOSPITAL COURSE:  Brittany Marks is a 79 y.o. admitted on 03/10/2020 with a diagnosis of Left proximal humerus pathologic fracture.  They were brought to the operating room on 03/10/2020 and underwent Procedure(s): [place].    They were given perioperative antibiotics:  Anti-infectives (From admission, onward)   Start     Dose/Rate Route Frequency Ordered Stop   03/10/20 1123  vancomycin (VANCOCIN) powder  Status:  Discontinued       As needed 03/10/20 1123 03/10/20 1344   03/10/20 0745  ceFAZolin (ANCEF) IVPB 2g/100 mL premix     2 g 200 mL/hr over 30 Minutes Intravenous On call to O.R. 03/10/20 2330 03/10/20 1005    .  Patient underwent the above named procedure and tolerated it well. The following day they were hemodynamically stable and pain was controlled on oral analgesics. They were neurovascularly intact to the operative extremity. OT was ordered and worked with patient per protocol. They were medically and orthopaedically stable for discharge on .    DIAGNOSTIC STUDIES:  RECENT RADIOGRAPHIC STUDIES :  No results found.  RECENT VITAL SIGNS:   Patient Vitals for the past 24 hrs:  BP Temp Temp src Pulse Resp SpO2 Height Weight  03/11/20 0813 -- -- -- -- -- 95 % -- --  03/11/20 0554 (!) 162/70 98.6 F (37 C) -- 95 17 97 % -- --  03/11/20 0139  (!) 171/82 98.3 F (36.8 C) -- 94 15 93 % -- --  03/10/20 2202 (!) 181/79 98.2 F (36.8 C) -- 89 16 95 % -- --  03/10/20 1650 136/78 97.7 F (36.5 C) Oral 89 16 97 % -- --  03/10/20 1550 129/69 -- -- 89 16 99 % -- --  03/10/20 1450 137/66 -- -- 91 16 98 % -- --  03/10/20 1400 -- -- -- -- -- -- 5\' 2"  (1.575 m) 60.1 kg  03/10/20 1350 (!) 150/68 (!) 97.5 F (36.4 C) Oral 85 15 99 % -- --  03/10/20 1330 (!) 161/73 97.6 F (36.4 C) -- 94 12 100 % -- --  03/10/20 1315 (!) 150/69 97.6 F (36.4 C) -- 91 12 97 % -- --  03/10/20 1300 (!) 160/74 -- -- 92 12 96 % -- --  03/10/20 1245 (!) 172/73 (!) 97.5 F (36.4 C) -- 90 12 100 % -- --  03/10/20 1230 (!) 173/89 -- -- 91 17 100 % -- --  03/10/20 1215 (!) 174/86 -- -- 99 15 100 % -- --  03/10/20 1213 (!) 185/84 (!) 97.4 F (36.3 C) -- 100 10 100 % -- --  .  RECENT EKG RESULTS:    Orders placed or performed in visit on 12/04/19  . EKG 12-Lead  DISCHARGE INSTRUCTIONS:    DISCHARGE MEDICATIONS:   Allergies as of 03/11/2020   No Known Allergies     Medication List    TAKE these medications   b complex vitamins tablet Take 1 tablet by mouth daily.   COD LIVER OIL PO Take 1 tablet by mouth daily.   cyclobenzaprine 10 MG tablet Commonly known as: FLEXERIL Take 1 tablet (10 mg total) by mouth 3 (three) times daily as needed for muscle spasms.   HYDROcodone-acetaminophen 5-325 MG tablet Commonly known as: Norco Take 1 tablet by mouth every 6 (six) hours as needed for moderate pain.   hydrocortisone 2.5 % cream Apply topically as needed. What changed:   how much to take  when to take this  reasons to take this   ibuprofen 200 MG tablet Commonly known as: ADVIL Take 600 mg by mouth daily.   lidocaine-prilocaine cream Commonly known as: EMLA Apply 1 application topically every 30 (thirty) days.   multivitamin tablet Take 1 tablet by mouth daily.   ondansetron 4 MG tablet Commonly known as: Zofran Take 1 tablet (4  mg total) by mouth every 8 (eight) hours as needed for nausea or vomiting.   oxymetazoline 0.05 % nasal spray Commonly known as: AFRIN Place 2 sprays into the nose 2 (two) times daily as needed (Bloody nose).   prochlorperazine 10 MG tablet Commonly known as: COMPAZINE Take 1 tablet (10 mg total) by mouth every 6 (six) hours as needed for nausea or vomiting.   sodium chloride 0.65 % nasal spray Commonly known as: OCEAN Place 1 spray into the nose at bedtime.   VITAMIN C PO Take by mouth daily.   VITAMIN E PO Take 1 tablet by mouth daily.       FOLLOW UP VISIT:   Follow-up Information    Justice Britain, MD.   Specialty: Orthopedic Surgery Why: in 10- 14 days Contact information: [address] [place] [phone]           DISCHARGE TO: Home   DISCHARGE CONDITION:  Thereasa Parkin Kyli Sorter for Dr. Justice Britain 03/11/2020, 8:22 AM

## 2020-03-11 NOTE — Evaluation (Addendum)
Occupational Therapy Evaluation Patient Details Name: Brittany Marks MRN: [id number] DOB: Feb 05, 1941 Today's Date: 03/11/2020    History of Present Illness 79 y.o female s/p L shoulder reverse arthorplasty due to proximal humerus pathologic fracture. 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   Clinical Impression   PTA pt living alone, independent for BADL/IADL. She reports having as needed assist from neighbor and family. At time of eval, pt presents with ability to complete bed mobility and transfers at mod I level. Reviewed shoulder precautions hand out with pt and educated on: safe sleep position, showering, dressing, sling wear and care, weight bearing precautions, and permitted exercises from surgeon (see shoulder section below for details). All education is complete and pt indicates understanding. Pt has all needed DME at home- has shower seat and cane as needed. Recommend pt follow up with OP therapy as permitted by surgeon. OT will sign off this date in anticipation for d/c.    Follow Up Recommendations  Follow surgeon's recommendation for DC plan and follow-up therapies;Other (comment)(OP OT once cleared by surgeon)    Equipment Recommendations  None recommended by OT    Recommendations for Other Services       Precautions / Restrictions Precautions Precautions: Shoulder;Fall Type of Shoulder Precautions: PROM 10 ER, 45 ABD,60 FE , PASSIVE ROM FOR ADL's ONLY, NOT for EXERCISES. OK to exercise elbow wrist and hand rom and for edema control OK for pendulums,may allow arm to dangle Pt may shower Shoulder Interventions: Shoulder sling/immobilizer Precaution Booklet Issued: Yes (comment) Precaution Comments: reviewed shoulder precautions with pt typical BADL routine Restrictions Weight Bearing Restrictions: Yes LUE Weight Bearing: Non weight bearing      Mobility Bed  Mobility Overal bed mobility: Modified Independent                Transfers Overall transfer level: Modified independent                    Balance Overall balance assessment: Mild deficits observed, not formally tested                                         ADL either performed or assessed with clinical judgement   ADL Overall ADL's : Modified independent                                       General ADL Comments: Pt demonstrates with ability to complete BADL at mod I level. Educated pt on safe shoulder precautions with BADL/IADL routines, which she is familiar with due to prior sxs. Pt able to toilet, manage sling, dress, and simulate bathing without assist.     Vision Baseline Vision/History: Cataracts Patient Visual Report: No change from baseline       Perception     Praxis      Pertinent Vitals/Pain Pain Assessment: Faces Faces Pain Scale: Hurts little more Pain Location: L shoulder incision site Pain Descriptors / Indicators: Aching;Sore Pain Intervention(s): Monitored during session;Repositioned     Hand Dominance Right   Extremity/Trunk Assessment Upper Extremity Assessment Upper Extremity Assessment: RUE deficits/detail;LUE deficits/detail RUE Deficits / Details: previous rotator cuff injuries as well as clavicle. States that this arm is not as functional as she desires, but  able to perform basic BADL/IADL tasks. Limited ROM LUE Deficits / Details: no edema noted, able to freely move elbow, wrist hand without much discomfort.   Lower Extremity Assessment Lower Extremity Assessment: Overall WFL for tasks assessed       Communication Communication Communication: No difficulties   Cognition Arousal/Alertness: Awake/alert Behavior During Therapy: WFL for tasks assessed/performed Overall Cognitive Status: Within Functional Limits for tasks assessed                                      General Comments  VSS on RA    Exercises Shoulder Exercises Pendulum Exercise: (still somewhat numb to perform safely) Elbow Flexion: AROM;5 reps;Left Elbow Extension: AROM;5 reps;Left Wrist Flexion: AROM;5 reps;Left Wrist Extension: AROM;5 reps;Left Digit Composite Flexion: AROM;5 reps;Left Composite Extension: AROM;5 reps;Left Neck Flexion: AROM;5 reps Neck Lateral Flexion - Right: AROM;5 reps Neck Lateral Flexion - Left: AROM;5 reps   Shoulder Instructions Shoulder Instructions Donning/doffing shirt without moving shoulder: Modified independent Method for sponge bathing under operated UE: Modified independent Donning/doffing sling/immobilizer: Modified independent Correct positioning of sling/immobilizer: Modified independent Pendulum exercises (written home exercise program): Minimal assistance(still somewhat numb from block to perform safely) ROM for elbow, wrist and digits of operated UE: Modified independent Sling wearing schedule (on at all times/off for ADL's): Modified independent Proper positioning of operated UE when showering: Modified independent Positioning of UE while sleeping: Modified independent    Home Living Family/patient expects to be discharged to:: Private residence Living Arrangements: Alone Available Help at Discharge: Neighbor;Available PRN/intermittently Type of Home: House Home Access: Stairs to enter [employer] of Steps: 4-5 Entrance Stairs-Rails: Right Home Layout: One level     Bathroom Shower/Tub: Occupational psychologist: Standard     Home Equipment: Shower seat - built in;Cane - single point;Walker - 2 wheels          Prior Functioning/Environment Level of Independence: Independent with assistive device(s)        Comments: States she uses a cane when needing to go longer distances, otherwise independent. Plans fo rneighbor to help with groceries and driving as needed        OT Problem List: Decreased  strength;Decreased knowledge of precautions;Decreased activity tolerance;Impaired UE functional use;Pain      OT Treatment/Interventions:      OT Goals(Current goals can be found in the care plan section) Acute Rehab OT Goals Patient Stated Goal: return home today OT Goal Formulation: All assessment and education complete, DC therapy  OT Frequency:     Barriers to D/C:            Co-evaluation              AM-PAC OT "6 Clicks" Daily Activity     Outcome Measure Help from another person eating meals?: None Help from another person taking care of personal grooming?: None Help from another person toileting, which includes using toliet, bedpan, or urinal?: None Help from another person bathing (including washing, rinsing, drying)?: None Help from another person to put on and taking off regular upper body clothing?: None Help from another person to put on and taking off regular lower body clothing?: None 6 Click Score: 24   End of Session Equipment Utilized During Treatment: Other (comment)(sling) Nurse Communication: Mobility status;Precautions;Weight bearing status  Activity Tolerance: Patient tolerated treatment well Patient left: in chair;with call bell/phone within reach  OT Visit Diagnosis: Other  abnormalities of gait and mobility (R26.89);Pain Pain - Right/Left: Left Pain - part of body: Shoulder                Time: 8916-9450 OT Time Calculation (min): 25 min Charges:  OT General Charges $OT Visit: 1 Visit OT Evaluation $OT Eval Low Complexity: 1 Low OT Treatments $Self Care/Home Management : 8-22 mins  Zenovia Jarred, MSOT, OTR/L Acute Rehabilitation Services [facility] Office Number: [phone] Pager: [phone]  Zenovia Jarred 03/11/2020, 9:09 AM

## 2020-03-11 NOTE — Plan of Care (Signed)
Plan of care reviewed and discussed with the patient. 

## 2020-03-11 NOTE — Progress Notes (Signed)
Patient is being discharged in stable condition. [place] set up by [facility].

## 2020-03-14 ENCOUNTER — Encounter: Payer: Self-pay | Admitting: *Deleted

## 2020-03-21 ENCOUNTER — Inpatient Hospital Stay: Payer: Federal, State, Local not specified - PPO

## 2020-03-21 ENCOUNTER — Ambulatory Visit: Payer: Federal, State, Local not specified - PPO

## 2020-03-21 ENCOUNTER — Ambulatory Visit: Payer: Federal, State, Local not specified - PPO | Admitting: Internal Medicine

## 2020-03-21 ENCOUNTER — Other Ambulatory Visit: Payer: Federal, State, Local not specified - PPO

## 2020-03-22 ENCOUNTER — Ambulatory Visit
Admission: RE | Admit: 2020-03-22 | Discharge: 2020-03-22 | Disposition: A | Discharge status: Home / Self Care | Payer: Federal, State, Local not specified - PPO | Source: Ambulatory Visit | Attending: Radiation Oncology | Admitting: Radiation Oncology

## 2020-03-22 ENCOUNTER — Other Ambulatory Visit: Payer: Self-pay

## 2020-03-22 ENCOUNTER — Ambulatory Visit: Payer: Federal, State, Local not specified - PPO | Attending: Radiation Oncology | Admitting: Radiation Oncology

## 2020-03-22 ENCOUNTER — Encounter: Payer: Self-pay | Admitting: Radiation Oncology

## 2020-03-22 VITALS — BP 138/56 | HR 80 | Temp 98.1°F | Resp 18 | Ht 62.0 in

## 2020-03-22 DIAGNOSIS — C3411 Malignant neoplasm of upper lobe, right bronchus or lung: Secondary | ICD-10-CM | POA: Insufficient documentation

## 2020-03-22 DIAGNOSIS — Z79899 Other long term (current) drug therapy: Secondary | ICD-10-CM | POA: Diagnosis not present

## 2020-03-22 DIAGNOSIS — C7951 Secondary malignant neoplasm of bone: Secondary | ICD-10-CM | POA: Diagnosis present

## 2020-03-22 DIAGNOSIS — K5900 Constipation, unspecified: Secondary | ICD-10-CM | POA: Insufficient documentation

## 2020-03-22 DIAGNOSIS — F17210 Nicotine dependence, cigarettes, uncomplicated: Secondary | ICD-10-CM | POA: Insufficient documentation

## 2020-03-22 DIAGNOSIS — C7952 Secondary malignant neoplasm of bone marrow: Secondary | ICD-10-CM

## 2020-03-22 NOTE — Progress Notes (Signed)
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  Interval Since Last Radiation: 11 months  04/02/2019-04/16/2019:  30 Gy in 10 fractions was given to the left proximal humerus.   Narrative: In summary this is a pleasant 79 year old patient with a story of extensive stage small cell cancer originating in the right upper lobe who also had bone metastases and was treated with palliative radiotherapy in the summer 2020.  The bone metastasis in her left proximal humerus unfortunately led to a fracture of this site, orthopedics was involved, but the patient had not followed up following her radiotherapy.  She did have some improvement initially in her pain, however her functionality and pain has persisted and she completed her course of systemic cisplatin etoposide.  She is currently on systemic immunotherapy under the care of Dr. Julien Nordmann.  She has had somewhat of a mixed response with recent imaging on 01/20/2020.  Unfortunately a CT of the humerus on the left did reveal persistent bulky disease measuring up to 5.4 cm encircling and encasing the fracture site, she does have a 1 cm low right paratracheal lymph node, and improvement was also seen in the porta hepatic region.  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  She was evaluated and offered options of postoperative radiotherapy after discussing her case with Dr. Onnie Graham. She proceeded with reverse shoulder arthoplasty on the left side on 03/10/20. She comes today to review the rationale to proceed with postoperative palliative radiotherapy. She plans to continue with Imfinzi and is due for her next infusion  tomorrow.   PAST MEDICAL HISTORY:  Past Medical History:  Diagnosis Date  . Cataract    Bilateral  . Constipation    pain medication  . Left rotator cuff tear   . Right rotator cuff tear   . SCL CA dx'd 11/2018    PAST SURGICAL HISTORY: Past Surgical History:  Procedure Laterality Date  . CATARACT EXTRACTION W/ INTRAOCULAR LENS  IMPLANT, BILATERAL  2012  . DECOMPRESSIVE LUMBAR LAMINECTOMY LEVEL 2 N/A 07/29/2013   Procedure: LUMBAR LAMINECTOMY, DECOMPRESSION LUMBAR THREE TO FOUR, FOUR TO FIVE microdiscectomy l3,4 right;  Surgeon: Tobi Bastos, MD;  Location: [facility];  Service: Orthopedics;  Laterality: N/A;  . I & D SUPERIOR RIGHT SHOULDER AND CLOSURE WOUND  01-10-2011   S/P ROTATOR CUFF REPAIR  . IR IMAGING GUIDED PORT INSERTION  04/09/2019  . LUMBAR LAMINECTOMY  1970'S  . LUMBAR LAMINECTOMY/DECOMPRESSION MICRODISCECTOMY N/A 06/27/2016   Procedure: L1 - L2 DISCECTOMY;  Surgeon: Melina Schools, MD;  Location: [facility];  Service: Orthopedics;  Laterality: N/A;  . [place]  . REVERSE SHOULDER ARTHROPLASTY Left 03/10/2020   Procedure: REVERSE SHOULDER ARTHROPLASTY;  Surgeon: Justice Britain, MD;  Location: [facility];  Service: Orthopedics;  Laterality: Left;  152min  . RIGHT SHOULDER ARTHROSCOPY/ OPEN DISTAL CLAVICLE RESECTION/ SAD/ OPEN ROTATOR CUFF REPAIR  11-28-2010  . SHOULDER FUSION SURGERY  03/11/2020   Shoulder Surgery , hardware placed  . SHOULDER OPEN ROTATOR CUFF REPAIR  12/19/2011   Procedure: ROTATOR CUFF REPAIR SHOULDER OPEN;  Surgeon: Magnus Sinning, MD;  Location: [facility];  Service: Orthopedics;  Laterality: Right;  RIGHT RECURRENT OPEN REPAIR OF THE ROTATOR CUFF WITH TISSUE MEND GRAFTANTERIOR CHROMIOECTOMY  . VAGINAL HYSTERECTOMY  1979    PAST SOCIAL HISTORY:  Social History   Socioeconomic History  . Marital status: Divorced    Spouse name: Not on file  . Number of children: 1  . Years of education: Not on file  . Highest education  level: Not on file  Occupational History  . Not on file  Tobacco Use  . Smoking status: Current Every Day Smoker    Packs/day: 0.25    Years: 50.00    Pack years: 12.50    Types: Cigarettes  . Smokeless tobacco: Never Used  . Tobacco comment: trying to quit now, using the patch  Vaping Use  . Vaping Use: Never used  Substance and Sexual Activity  . Alcohol use: Yes    Comment: RARE  . Drug use: No  . Sexual activity: Not on file  Other Topics Concern  . Not on file  Social History Narrative  . Not on file   Social Determinants of Health   Financial Resource Strain:   . Difficulty of Paying Living Expenses:   Food Insecurity:   . Worried About Charity fundraiser in the Last Year:   . Arboriculturist in the Last Year:   Transportation Needs: No Transportation Needs  . Lack of Transportation (Medical): No  . Lack of Transportation (Non-Medical): No  Physical Activity:   . Days of Exercise per Week:   . Minutes of Exercise per Session:   Stress:   . Feeling of Stress :   Social Connections:   . Frequency of Communication with Friends and Family:   . Frequency of Social Gatherings with Friends and Family:   . Attends Religious Services:   . Active Member of Clubs or Organizations:   . Attends Archivist Meetings:   Marland Kitchen Marital Status:   Intimate Partner Violence:   . Fear of Current or Ex-Partner:   . Emotionally Abused:   Marland Kitchen Physically Abused:   . Sexually Abused:     PAST FAMILY HISTORY: Family History  Problem Relation Age of Onset  . Congestive Heart Failure Mother   . Congestive Heart Failure Father     MEDICATIONS  Current Outpatient Medications  Medication Sig Dispense Refill  . Ascorbic Acid (VITAMIN C PO) Take by mouth daily.     Marland Kitchen b complex vitamins tablet Take 1 tablet by mouth daily.    . COD LIVER OIL PO Take 1 tablet by mouth daily.     . cyclobenzaprine (FLEXERIL) 10 MG tablet Take 1 tablet (10 mg total) by mouth 3 (three) times daily  as needed for muscle spasms. 30 tablet 1  . HYDROcodone-acetaminophen (NORCO) 5-325 MG tablet Take 1 tablet by mouth every 6 (six) hours as needed for moderate pain. 20 tablet 0  . hydrocortisone 2.5 % cream Apply topically as needed. (Patient taking differently: Apply 1 application topically daily as needed (Treatment). ) 3.5 g 0  . ibuprofen (ADVIL) 200 MG tablet Take 600 mg by mouth daily.    Marland Kitchen lidocaine-prilocaine (EMLA) cream Apply 1 application topically every 30 (thirty) days.     . Multiple Vitamin (MULTIVITAMIN) tablet Take 1 tablet by mouth daily.    . ondansetron (ZOFRAN) 4 MG tablet Take 1 tablet (4 mg total) by mouth every 8 (eight) hours as needed for nausea or vomiting. 10 tablet 0  . oxymetazoline (AFRIN) 0.05 % nasal spray  Place 2 sprays into the nose 2 (two) times daily as needed (Bloody nose).     Marland Kitchen prochlorperazine (COMPAZINE) 10 MG tablet Take 1 tablet (10 mg total) by mouth every 6 (six) hours as needed for nausea or vomiting. 30 tablet 0  . sodium chloride (OCEAN) 0.65 % nasal spray Place 1 spray into the nose at bedtime.     Marland Kitchen VITAMIN E PO Take 1 tablet by mouth daily.      No current facility-administered medications for this encounter.   Facility-Administered Medications Ordered in Other Encounters  Medication Dose Route Frequency Provider Last Rate Last Admin  . heparin lock flush 100 unit/mL  500 Units Intracatheter Once PRN Magrinat, Virgie Dad, MD      . sodium chloride flush (NS) 0.9 % injection 10 mL  10 mL Intracatheter PRN Magrinat, Virgie Dad, MD        ALLERGIES:  No Known Allergies Physical exam Wt Readings from Last 3 Encounters:  03/10/20 132 lb 6.4 oz (60.1 kg)  03/03/20 132 lb 6.4 oz (60.1 kg)  03/02/20 131 lb 6 oz (59.6 kg)   Temp Readings from Last 3 Encounters:  03/22/20 98.1 F (36.7 C) (Temporal)  03/11/20 98.1 F (36.7 C) (Oral)  03/02/20 98.3 F (36.8 C) (Oral)   BP Readings from Last 3 Encounters:  03/22/20 (!) 138/56  03/11/20 (!)  155/74  03/03/20 128/62   Pulse Readings from Last 3 Encounters:  03/22/20 80  03/11/20 88  03/03/20 82   In general this is a well appearing Caucasian female in no acute distress.  She's alert and oriented x4 and appropriate throughout the examination. Cardiopulmonary assessment is negative for acute distress and she exhibits normal effort.  No edema is noted of the hand or forearm. The incision along the left aspect of her upper arm is inspected and is well approximated without evidence of erythema or separation. Mild bruising of the surrounding skin is noted. No other visible abnormalities are otherwise noted.  Impression/Plan: 1. 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 Dr. Lisbeth Renshaw reviews the findings and postoperative course.  Dr. Lisbeth Renshaw discusses the delivery and logistics of radiotherapy and anticipates a course of 2 weeks of radiotherapy. Written consent is obtained and placed in the chart again to update the most recent discussion. She will continue with Imfinzi with Dr. Julien Nordmann as well. 2. PCI. She is aware that if she has a complete response, she may be a candidate for prophylactic cranial irradiation. This will be considered at a later time as well.   In a visit lasting 35 minutes, greater than 50% of the time was spent face to face discussing the patient's condition, in preparation for the discussion, and coordinating the patient's care.   The above documentation reflects my direct findings during this shared patient visit. Please see the separate note by Dr. Lisbeth Renshaw on this date for the remainder of the patient's plan of care.     Carola Rhine, PAC

## 2020-03-23 ENCOUNTER — Inpatient Hospital Stay: Payer: Federal, State, Local not specified - PPO | Attending: Oncology

## 2020-03-23 ENCOUNTER — Inpatient Hospital Stay: Payer: Federal, State, Local not specified - PPO | Attending: Internal Medicine

## 2020-03-23 ENCOUNTER — Encounter: Payer: Self-pay | Admitting: Internal Medicine

## 2020-03-23 ENCOUNTER — Other Ambulatory Visit: Payer: Self-pay

## 2020-03-23 ENCOUNTER — Inpatient Hospital Stay (HOSPITAL_BASED_OUTPATIENT_CLINIC_OR_DEPARTMENT_OTHER): Payer: Federal, State, Local not specified - PPO | Admitting: Internal Medicine

## 2020-03-23 ENCOUNTER — Inpatient Hospital Stay: Payer: Federal, State, Local not specified - PPO

## 2020-03-23 VITALS — BP 132/59 | HR 82 | Temp 97.9°F | Resp 17 | Ht 62.0 in | Wt 133.1 lb

## 2020-03-23 DIAGNOSIS — C7951 Secondary malignant neoplasm of bone: Secondary | ICD-10-CM | POA: Insufficient documentation

## 2020-03-23 DIAGNOSIS — R937 Abnormal findings on diagnostic imaging of other parts of musculoskeletal system: Secondary | ICD-10-CM

## 2020-03-23 DIAGNOSIS — K5900 Constipation, unspecified: Secondary | ICD-10-CM | POA: Diagnosis not present

## 2020-03-23 DIAGNOSIS — Z79899 Other long term (current) drug therapy: Secondary | ICD-10-CM | POA: Insufficient documentation

## 2020-03-23 DIAGNOSIS — M25512 Pain in left shoulder: Secondary | ICD-10-CM | POA: Insufficient documentation

## 2020-03-23 DIAGNOSIS — C778 Secondary and unspecified malignant neoplasm of lymph nodes of multiple regions: Secondary | ICD-10-CM | POA: Diagnosis not present

## 2020-03-23 DIAGNOSIS — Z95828 Presence of other vascular implants and grafts: Secondary | ICD-10-CM

## 2020-03-23 DIAGNOSIS — C3411 Malignant neoplasm of upper lobe, right bronchus or lung: Secondary | ICD-10-CM

## 2020-03-23 DIAGNOSIS — Z5112 Encounter for antineoplastic immunotherapy: Secondary | ICD-10-CM

## 2020-03-23 DIAGNOSIS — Z1211 Encounter for screening for malignant neoplasm of colon: Secondary | ICD-10-CM

## 2020-03-23 LAB — COMPREHENSIVE METABOLIC PANEL
ALT: 14 U/L (ref 0–44)
AST: 22 U/L (ref 15–41)
Albumin: 3.5 g/dL (ref 3.5–5.0)
Alkaline Phosphatase: 76 U/L (ref 38–126)
Anion gap: 8 (ref 5–15)
BUN: 20 mg/dL (ref 8–23)
CO2: 24 mmol/L (ref 22–32)
Calcium: 9.1 mg/dL (ref 8.9–10.3)
Chloride: 105 mmol/L (ref 98–111)
Creatinine, Ser: 0.77 mg/dL (ref 0.44–1.00)
GFR calc Af Amer: >60 mL/min (ref >60)
GFR calc non Af Amer: >60 mL/min (ref >60)
Glucose, Bld: 128 mg/dL — ABNORMAL HIGH (ref 70–99)
Potassium: 4.3 mmol/L (ref 3.5–5.1)
Sodium: 137 mmol/L (ref 135–145)
Total Bilirubin: 0.5 mg/dL (ref 0.3–1.2)
Total Protein: 6.7 g/dL (ref 6.5–8.1)

## 2020-03-23 LAB — CBC WITH DIFFERENTIAL/PLATELET
Abs Immature Granulocytes: 0.03 10*3/uL (ref 0.00–0.07)
Basophils Absolute: 0.1 10*3/uL (ref 0.0–0.1)
Basophils Relative: 1 %
Eosinophils Absolute: 0.2 10*3/uL (ref 0.0–0.5)
Eosinophils Relative: 2 %
HCT: 30.8 % — ABNORMAL LOW (ref 36.0–46.0)
Hemoglobin: 10.2 g/dL — ABNORMAL LOW (ref 12.0–15.0)
Immature Granulocytes: 0 %
Lymphocytes Relative: 18 %
Lymphs Abs: 1.6 10*3/uL (ref 0.7–4.0)
MCH: 32.5 pg (ref 26.0–34.0)
MCHC: 33.1 g/dL (ref 30.0–36.0)
MCV: 98.1 fL (ref 80.0–100.0)
Monocytes Absolute: 0.8 10*3/uL (ref 0.1–1.0)
Monocytes Relative: 9 %
Neutro Abs: 6.2 10*3/uL (ref 1.7–7.7)
Neutrophils Relative %: 70 %
Platelets: 311 10*3/uL (ref 150–400)
RBC: 3.14 MIL/uL — ABNORMAL LOW (ref 3.87–5.11)
RDW: 14.1 % (ref 11.5–15.5)
WBC: 9 10*3/uL (ref 4.0–10.5)
nRBC: 0 % (ref 0.0–0.2)

## 2020-03-23 LAB — TSH: TSH: 1.904 u[IU]/mL (ref 0.308–3.960)

## 2020-03-23 MED ORDER — SODIUM CHLORIDE 0.9% FLUSH
10.0000 mL | INTRAVENOUS | Status: DC | PRN
  Administered 2020-03-23: 10 mL
  Filled 2020-03-23: qty 10

## 2020-03-23 MED ORDER — HEPARIN SOD (PORK) LOCK FLUSH 100 UNIT/ML IV SOLN
500.0000 [IU] | Freq: Once | INTRAVENOUS | Status: AC | PRN
  Administered 2020-03-23: 500 [IU]
  Filled 2020-03-23: qty 5

## 2020-03-23 MED ORDER — SODIUM CHLORIDE 0.9 % IV SOLN
1500.0000 mg | Freq: Once | INTRAVENOUS | Status: AC
  Administered 2020-03-23: 1500 mg via INTRAVENOUS
  Filled 2020-03-23: qty 30

## 2020-03-23 MED ORDER — SODIUM CHLORIDE 0.9 % IV SOLN
Freq: Once | INTRAVENOUS | Status: AC
  Filled 2020-03-23: qty 250

## 2020-03-23 NOTE — Patient Instructions (Signed)
Steps to Quit Smoking Smoking tobacco is the leading cause of preventable death. It can affect almost every organ in the body. Smoking puts you and people around you at risk for many serious, long-lasting (chronic) diseases. Quitting smoking can be hard, but it is one of the best things that you can do for your health. It is never too late to quit. How do I get ready to quit? When you decide to quit smoking, make a plan to help you succeed. Before you quit:  Pick a date to quit. Set a date within the next 2 weeks to give you time to prepare.  Write down the reasons why you are quitting. Keep this list in places where you will see it often.  Tell your family, friends, and co-workers that you are quitting. Their support is important.  Talk with your doctor about the choices that may help you quit.  Find out if your health insurance will pay for these treatments.  Know the people, places, things, and activities that make you want to smoke (triggers). Avoid them. What first steps can I take to quit smoking?  Throw away all cigarettes at home, at work, and in your car.  Throw away the things that you use when you smoke, such as ashtrays and lighters.  Clean your car. Make sure to empty the ashtray.  Clean your home, including curtains and carpets. What can I do to help me quit smoking? Talk with your doctor about taking medicines and seeing a counselor at the same time. You are more likely to succeed when you do both.  If you are pregnant or breastfeeding, talk with your doctor about counseling or other ways to quit smoking. Do not take medicine to help you quit smoking unless your doctor tells you to do so. To quit smoking: Quit right away  Quit smoking totally, instead of slowly cutting back on how much you smoke over a period of time.  Go to counseling. You are more likely to quit if you go to counseling sessions regularly. Take medicine You may take medicines to help you quit. Some  medicines need a prescription, and some you can buy over-the-counter. Some medicines may contain a drug called nicotine to replace the nicotine in cigarettes. Medicines may:  Help you to stop having the desire to smoke (cravings).  Help to stop the problems that come when you stop smoking (withdrawal symptoms). Your doctor may ask you to use:  Nicotine patches, gum, or lozenges.  Nicotine inhalers or sprays.  Non-nicotine medicine that is taken by mouth. Find resources Find resources and other ways to help you quit smoking and remain smoke-free after you quit. These resources are most helpful when you use them often. They include:  Online chats with a counselor.  Phone quitlines.  Printed self-help materials.  Support groups or group counseling.  Text messaging programs.  Mobile phone apps. Use apps on your mobile phone or tablet that can help you stick to your quit plan. There are many free apps for mobile phones and tablets as well as websites. Examples include Quit Guide from the CDC and smokefree.gov  What things can I do to make it easier to quit?   Talk to your family and friends. Ask them to support and encourage you.  Call a phone quitline (1-800-QUIT-NOW), reach out to support groups, or work with a counselor.  Ask people who smoke to not smoke around you.  Avoid places that make you want to smoke,   such as: ? Bars. ? Parties. ? Smoke-break areas at work.  Spend time with people who do not smoke.  Lower the stress in your life. Stress can make you want to smoke. Try these things to help your stress: ? Getting regular exercise. ? Doing deep-breathing exercises. ? Doing yoga. ? Meditating. ? Doing a body scan. To do this, close your eyes, focus on one area of your body at a time from head to toe. Notice which parts of your body are tense. Try to relax the muscles in those areas. How will I feel when I quit smoking? Day 1 to 3 weeks Within the first 24 hours,  you may start to have some problems that come from quitting tobacco. These problems are very bad 2-3 days after you quit, but they do not often last for more than 2-3 weeks. You may get these symptoms:  Mood swings.  Feeling restless, nervous, angry, or annoyed.  Trouble concentrating.  Dizziness.  Strong desire for high-sugar foods and nicotine.  Weight gain.  Trouble pooping (constipation).  Feeling like you may vomit (nausea).  Coughing or a sore throat.  Changes in how the medicines that you take for other issues work in your body.  Depression.  Trouble sleeping (insomnia). Week 3 and afterward After the first 2-3 weeks of quitting, you may start to notice more positive results, such as:  Better sense of smell and taste.  Less coughing and sore throat.  Slower heart rate.  Lower blood pressure.  Clearer skin.  Better breathing.  Fewer sick days. Quitting smoking can be hard. Do not give up if you fail the first time. Some people need to try a few times before they succeed. Do your best to stick to your quit plan, and talk with your doctor if you have any questions or concerns. Summary  Smoking tobacco is the leading cause of preventable death. Quitting smoking can be hard, but it is one of the best things that you can do for your health.  When you decide to quit smoking, make a plan to help you succeed.  Quit smoking right away, not slowly over a period of time.  When you start quitting, seek help from your doctor, family, or friends. This information is not intended to replace advice given to you by your health care provider. Make sure you discuss any questions you have with your health care provider. Document Revised: 06/19/2019 Document Reviewed: 12/13/2018 Elsevier Patient Education  2020 [employer]  

## 2020-03-23 NOTE — Patient Instructions (Signed)
[facility] Discharge Instructions for Patients Receiving Chemotherapy  Today you received the following chemotherapy agents: Imfinzi  To help prevent nausea and vomiting after your treatment, we encourage you to take your nausea medication as prescribed.   If you develop nausea and vomiting that is not controlled by your nausea medication, call the clinic.   BELOW ARE SYMPTOMS THAT SHOULD BE REPORTED IMMEDIATELY:  *FEVER GREATER THAN 100.5 F  *CHILLS WITH OR WITHOUT FEVER  NAUSEA AND VOMITING THAT IS NOT CONTROLLED WITH YOUR NAUSEA MEDICATION  *UNUSUAL SHORTNESS OF BREATH  *UNUSUAL BRUISING OR BLEEDING  TENDERNESS IN MOUTH AND THROAT WITH OR WITHOUT PRESENCE OF ULCERS  *URINARY PROBLEMS  *BOWEL PROBLEMS  UNUSUAL RASH Items with * indicate a potential emergency and should be followed up as soon as possible.  Feel free to call the clinic should you have any questions or concerns. The clinic phone number is [phone].  Please show the [place] at check-in to the Emergency Department and triage nurse.

## 2020-03-23 NOTE — Progress Notes (Signed)
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  PRIOR THERAPY: None  CURRENT THERAPY: Palliative systemic chemotherapy with carboplatin for AUC of 5 on day 1, etoposide 100 mg/M2 on days 1, 2 and 3 as well as Imfinzi 1500 mg every 3 weeks with the chemotherapy and Neulasta support. First dose on 03/16/2019. Status post 14 cycles.   Starting from cycle #5 the patient will be treated with maintenance treatment with Imfinzi 1500 mg IV every 4 weeks.  INTERVAL HISTORY: Brittany Marks 79 y.o. female returns to the clinic today for follow-up visit.  The patient is feeling fine today with no concerning complaints except for the pain in the left shoulder area.  She recently underwent left shoulder reverse arthroplasty under the care of Dr. Onnie Graham.  The patient denied having any chest pain, shortness of breath, cough or hemoptysis.  She denied having any fever or chills.  She has no nausea, vomiting, diarrhea or constipation.  She denied having any headache or visual changes.  She is here today for evaluation before starting cycle #15 of her treatment.   MEDICAL HISTORY: Past Medical History:  Diagnosis Date  . Cataract    Bilateral  . Constipation    pain medication  . Left rotator cuff tear   . Right rotator cuff tear   . SCL CA dx'd 11/2018    ALLERGIES:  has No Known Allergies.  MEDICATIONS:  Current Outpatient Medications  Medication Sig Dispense Refill  . Ascorbic Acid (VITAMIN C PO) Take by mouth daily.     Marland Kitchen b complex vitamins tablet Take 1 tablet by mouth daily.    . COD LIVER OIL PO  Take 1 tablet by mouth daily.     . cyclobenzaprine (FLEXERIL) 10 MG tablet Take 1 tablet (10 mg total) by mouth 3 (three) times daily as needed for muscle spasms. 30 tablet 1  . HYDROcodone-acetaminophen (NORCO) 5-325 MG tablet Take 1 tablet by mouth every 6 (six) hours as needed for moderate pain. 20 tablet 0  . hydrocortisone 2.5 % cream Apply topically as needed. (Patient taking differently: Apply 1 application topically daily as needed (Treatment). ) 3.5 g 0  . ibuprofen (ADVIL) 200 MG tablet Take 600 mg by mouth daily.    Marland Kitchen lidocaine-prilocaine (EMLA) cream Apply 1 application topically every 30 (thirty) days.     . Multiple Vitamin (MULTIVITAMIN) tablet Take 1 tablet by mouth daily.    . ondansetron (ZOFRAN) 4 MG tablet Take 1 tablet (4 mg total) by mouth every 8 (eight) hours as needed for nausea or vomiting. 10 tablet 0  . oxymetazoline (AFRIN) 0.05 % nasal spray Place 2 sprays into the nose 2 (two) times daily as needed (Bloody nose).     Marland Kitchen prochlorperazine (COMPAZINE) 10 MG tablet Take 1 tablet (10 mg total) by mouth every 6 (six) hours as needed for nausea or vomiting. 30 tablet 0  . sodium chloride (OCEAN) 0.65 % nasal spray Place 1 spray into the nose at bedtime.     Marland Kitchen VITAMIN E PO Take 1 tablet by mouth daily.      No current facility-administered medications for  this visit.   Facility-Administered Medications Ordered in Other Visits  Medication Dose Route Frequency Provider Last Rate Last Admin  . heparin lock flush 100 unit/mL  500 Units Intracatheter Once PRN Magrinat, Virgie Dad, MD      . sodium chloride flush (NS) 0.9 % injection 10 mL  10 mL Intracatheter PRN Magrinat, Virgie Dad, MD        SURGICAL HISTORY:  Past Surgical History:  Procedure Laterality Date  . CATARACT EXTRACTION W/ INTRAOCULAR LENS  IMPLANT, BILATERAL  2012  . DECOMPRESSIVE LUMBAR LAMINECTOMY LEVEL 2 N/A 07/29/2013   Procedure: LUMBAR LAMINECTOMY, DECOMPRESSION LUMBAR THREE TO FOUR, FOUR TO FIVE  microdiscectomy l3,4 right;  Surgeon: Tobi Bastos, MD;  Location: [facility];  Service: Orthopedics;  Laterality: N/A;  . I & D SUPERIOR RIGHT SHOULDER AND CLOSURE WOUND  01-10-2011   S/P ROTATOR CUFF REPAIR  . IR IMAGING GUIDED PORT INSERTION  04/09/2019  . LUMBAR LAMINECTOMY  1970'S  . LUMBAR LAMINECTOMY/DECOMPRESSION MICRODISCECTOMY N/A 06/27/2016   Procedure: L1 - L2 DISCECTOMY;  Surgeon: Melina Schools, MD;  Location: [place];  Service: Orthopedics;  Laterality: N/A;  . [place]  . REVERSE SHOULDER ARTHROPLASTY Left 03/10/2020   Procedure: REVERSE SHOULDER ARTHROPLASTY;  Surgeon: Justice Britain, MD;  Location: [facility];  Service: Orthopedics;  Laterality: Left;  176min  . RIGHT SHOULDER ARTHROSCOPY/ OPEN DISTAL CLAVICLE RESECTION/ SAD/ OPEN ROTATOR CUFF REPAIR  11-28-2010  . SHOULDER FUSION SURGERY  03/11/2020   Shoulder Surgery , hardware placed  . SHOULDER OPEN ROTATOR CUFF REPAIR  12/19/2011   Procedure: ROTATOR CUFF REPAIR SHOULDER OPEN;  Surgeon: Magnus Sinning, MD;  Location: [facility];  Service: Orthopedics;  Laterality: Right;  RIGHT RECURRENT OPEN REPAIR OF THE ROTATOR CUFF WITH TISSUE MEND GRAFTANTERIOR CHROMIOECTOMY  . VAGINAL HYSTERECTOMY  1979    REVIEW OF SYSTEMS:  A comprehensive review of systems was negative except for: Constitutional: positive for fatigue Musculoskeletal: positive for arthralgias   PHYSICAL EXAMINATION: General appearance: alert, cooperative, fatigued and no distress Head: Normocephalic, without obvious abnormality, atraumatic Neck: no adenopathy, no JVD, supple, symmetrical, trachea midline and thyroid not enlarged, symmetric, no tenderness/mass/nodules Lymph nodes: Cervical, supraclavicular, and axillary nodes normal. Resp: clear to auscultation bilaterally Back: symmetric, no curvature. ROM normal. No CVA tenderness. Cardio: regular rate and rhythm, S1, S2 normal, no murmur, click, rub or gallop GI: soft, non-tender;  bowel sounds normal; no masses,  no organomegaly Extremities: extremities normal, atraumatic, no cyanosis or edema   ECOG PERFORMANCE STATUS: 1 - Symptomatic but completely ambulatory  Blood pressure (!) 132/59, pulse 82, temperature 97.9 F (36.6 C), temperature source Temporal, resp. rate 17, height 5\' 2"  (1.575 m), weight 133 lb 1.6 oz (60.4 kg), SpO2 98 %.  LABORATORY DATA: Lab Results  Component Value Date   WBC 9.0 03/23/2020   HGB 10.2 (L) 03/23/2020   HCT 30.8 (L) 03/23/2020   MCV 98.1 03/23/2020   PLT 311 03/23/2020      Chemistry      Component Value Date/Time   NA 136 02/22/2020 1154   K 4.2 02/22/2020 1154   CL 104 02/22/2020 1154   CO2 24 02/22/2020 1154   BUN 18 02/22/2020 1154   CREATININE 0.72 02/22/2020 1154   CREATININE 0.76 12/21/2019 1100      Component Value Date/Time   CALCIUM 8.8 (L) 02/22/2020 1154   ALKPHOS 74 02/22/2020 1154   AST 21 02/22/2020 1154   AST 22 12/21/2019 1100  ALT 18 02/22/2020 1154   ALT 13 12/21/2019 1100   BILITOT 0.3 02/22/2020 1154   BILITOT 0.3 12/21/2019 1100       RADIOGRAPHIC STUDIES: No results found.  ASSESSMENT AND PLAN: This is a very pleasant 79 years old white female with extensive stage small cell lung cancer and she is currently undergoing treatment with carboplatin, etoposide and Imfinzi status post 5 cycles.  Starting from cycle #6 the patient is on maintenance treatment with single agent Imfinzi every 4 weeks status post 9 cycles. The patient has been tolerating her treatment with immunotherapy fairly well with no concerning adverse effects. I recommended for her to proceed with cycle #10 of her maintenance therapy today as planned. I was planning to repeat imaging studies after this cycle but the patient would like to wait for 1 more cycle of treatment because she has too many things going on at the same time. I will see her back for follow-up visit in 4 weeks for evaluation before the next cycle of her  treatment. She was advised to call immediately if she has any concerning symptoms in the interval. The patient voices understanding of current disease status and treatment options and is in agreement with the current care plan.  All questions were answered. The patient knows to call the clinic with any problems, questions or concerns. We can certainly see the patient much sooner if necessary.  Disclaimer: This note was dictated with voice recognition software. Similar sounding words can inadvertently be transcribed and may not be corrected upon review.

## 2020-03-29 DIAGNOSIS — C7951 Secondary malignant neoplasm of bone: Secondary | ICD-10-CM | POA: Diagnosis not present

## 2020-04-07 ENCOUNTER — Ambulatory Visit
Admission: RE | Admit: 2020-04-07 | Discharge: 2020-04-07 | Disposition: A | Discharge status: Home / Self Care | Payer: Federal, State, Local not specified - PPO | Source: Ambulatory Visit | Attending: Radiation Oncology | Admitting: Radiation Oncology

## 2020-04-07 ENCOUNTER — Other Ambulatory Visit: Payer: Self-pay

## 2020-04-07 DIAGNOSIS — C3411 Malignant neoplasm of upper lobe, right bronchus or lung: Secondary | ICD-10-CM | POA: Diagnosis not present

## 2020-04-07 DIAGNOSIS — C7951 Secondary malignant neoplasm of bone: Secondary | ICD-10-CM | POA: Diagnosis present

## 2020-04-07 DIAGNOSIS — Z510 Encounter for antineoplastic radiation therapy: Secondary | ICD-10-CM | POA: Diagnosis not present

## 2020-04-08 ENCOUNTER — Other Ambulatory Visit: Payer: Self-pay

## 2020-04-08 ENCOUNTER — Ambulatory Visit
Admission: RE | Admit: 2020-04-08 | Discharge: 2020-04-08 | Disposition: A | Discharge status: Home / Self Care | Payer: Federal, State, Local not specified - PPO | Source: Ambulatory Visit | Attending: Radiation Oncology | Admitting: Radiation Oncology

## 2020-04-08 DIAGNOSIS — C7951 Secondary malignant neoplasm of bone: Secondary | ICD-10-CM | POA: Diagnosis not present

## 2020-04-12 ENCOUNTER — Ambulatory Visit
Admission: RE | Admit: 2020-04-12 | Discharge: 2020-04-12 | Disposition: A | Discharge status: Home / Self Care | Payer: Federal, State, Local not specified - PPO | Source: Ambulatory Visit | Attending: Radiation Oncology | Admitting: Radiation Oncology

## 2020-04-12 DIAGNOSIS — C7951 Secondary malignant neoplasm of bone: Secondary | ICD-10-CM | POA: Diagnosis not present

## 2020-04-12 NOTE — Progress Notes (Signed)
Pharmacist Chemotherapy Monitoring - Follow Up Assessment    I verify that I have reviewed each item in the below checklist:   Regimen for the patient is scheduled for the appropriate day and plan matches scheduled date.  Appropriate non-routine labs are ordered dependent on drug ordered.  If applicable, additional medications reviewed and ordered per protocol based on lifetime cumulative doses and/or treatment regimen.   Plan for follow-up and/or issues identified: No  I-vent associated with next due treatment: No  MD and/or nursing notified: No  Philomena Course 04/12/2020 1:02 PM

## 2020-04-13 ENCOUNTER — Ambulatory Visit
Admission: RE | Admit: 2020-04-13 | Discharge: 2020-04-13 | Disposition: A | Discharge status: Home / Self Care | Payer: Federal, State, Local not specified - PPO | Source: Ambulatory Visit | Attending: Radiation Oncology | Admitting: Radiation Oncology

## 2020-04-13 ENCOUNTER — Other Ambulatory Visit: Payer: Self-pay

## 2020-04-13 DIAGNOSIS — C7951 Secondary malignant neoplasm of bone: Secondary | ICD-10-CM | POA: Diagnosis not present

## 2020-04-14 ENCOUNTER — Ambulatory Visit
Admission: RE | Admit: 2020-04-14 | Discharge: 2020-04-14 | Disposition: A | Discharge status: Home / Self Care | Payer: Federal, State, Local not specified - PPO | Source: Ambulatory Visit | Attending: Radiation Oncology | Admitting: Radiation Oncology

## 2020-04-14 ENCOUNTER — Other Ambulatory Visit: Payer: Self-pay

## 2020-04-14 DIAGNOSIS — C7951 Secondary malignant neoplasm of bone: Secondary | ICD-10-CM | POA: Diagnosis not present

## 2020-04-15 ENCOUNTER — Ambulatory Visit
Admission: RE | Admit: 2020-04-15 | Discharge: 2020-04-15 | Disposition: A | Discharge status: Home / Self Care | Payer: Federal, State, Local not specified - PPO | Source: Ambulatory Visit | Attending: Radiation Oncology | Admitting: Radiation Oncology

## 2020-04-15 ENCOUNTER — Other Ambulatory Visit: Payer: Self-pay

## 2020-04-15 DIAGNOSIS — C7951 Secondary malignant neoplasm of bone: Secondary | ICD-10-CM | POA: Diagnosis not present

## 2020-04-16 ENCOUNTER — Ambulatory Visit: Payer: Federal, State, Local not specified - PPO

## 2020-04-16 NOTE — Progress Notes (Signed)
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 Lisbeth Renshaw. 2) Additional palliative radiotherapy to the proximal humerus under the care of Dr. Lisbeth Renshaw. Last treatment expected on 04/21/2020  CURRENT THERAPY: Palliative systemic chemotherapy with carboplatin for AUC of 5 on day 1, etoposide 100 mg/M2 on days 1, 2 and 3 as well as Imfinzi 1500 mg every 3 weeks with the chemotherapy and Neulasta support. First dose on 03/16/2019. Status post15cycles.Starting from cycle #5 the patient will be treated with maintenance treatment with Imfinzi 1500 mg IV every 4 weeks.  INTERVAL HISTORY: Brittany Marks 79 y.o. female returns to the clinic today for a follow up visit. The patient is feeling well today without any concerning complaints. The patient had a pathological fracture of the left proximal humerus in June 2021. She is currently undergoing radiation to this area and she is expected to complete this course on 04/21/20. She takes ibuprofen for the pain which "usually" helps. The patient continues to tolerate treatment with Imfinzi well without any adverse side effects. Denies any fever, chills, night sweats, or weight loss. Denies any chest pain, shortness of breath, or hemoptysis.She reports her baseline mild dry cough "from time to time"..Denies any nausea, vomiting, diarrhea, or constipation. Denies any headache or visual changes.She is here today for evaluation before starting cycle #16.   MEDICAL HISTORY: Past Medical History:  Diagnosis Date  . Cataract    Bilateral   . Constipation    pain medication  . Left rotator cuff tear   . Right rotator cuff tear   . SCL CA dx'd 11/2018    ALLERGIES:  has No Known Allergies.  MEDICATIONS:  Current Outpatient Medications  Medication Sig Dispense Refill  . Ascorbic Acid (VITAMIN C PO) Take by mouth daily.     Marland Kitchen b complex vitamins tablet Take 1 tablet by mouth daily.    . COD LIVER OIL PO Take 1 tablet by mouth daily.     . cyclobenzaprine (FLEXERIL) 10 MG tablet Take 1 tablet (10 mg total) by mouth 3 (three) times daily as needed for muscle spasms. 30 tablet 1  . HYDROcodone-acetaminophen (NORCO) 5-325 MG tablet Take 1 tablet by mouth every 6 (six) hours as needed for moderate pain. 20 tablet 0  . hydrocortisone 2.5 % cream Apply topically as needed. (Patient taking differently: Apply 1 application topically daily as needed (Treatment). ) 3.5 g 0  . ibuprofen (ADVIL) 200 MG tablet Take 600 mg by mouth daily.    Marland Kitchen lidocaine-prilocaine (EMLA) cream Apply 1 application topically every 30 (thirty) days.     . Multiple Vitamin (MULTIVITAMIN) tablet Take 1 tablet by mouth daily.    . ondansetron (ZOFRAN) 4 MG tablet Take 1 tablet (4 mg total) by mouth every 8 (eight) hours as needed for nausea or vomiting. 10 tablet 0  . oxymetazoline (AFRIN) 0.05 % nasal spray Place 2 sprays into the nose 2 (two) times daily as needed (Bloody nose).     Marland Kitchen prochlorperazine (COMPAZINE) 10 MG tablet Take 1 tablet (10 mg total) by mouth every 6 (six) hours as needed for nausea or vomiting. 30 tablet  0  . sodium chloride (OCEAN) 0.65 % nasal spray Place 1 spray into the nose at bedtime.     Marland Kitchen VITAMIN E PO Take 1 tablet by mouth daily.      No current facility-administered medications for this visit.   Facility-Administered Medications Ordered in Other Visits  Medication Dose Route Frequency Provider Last Rate Last Admin  . heparin lock flush 100 unit/mL  500 Units Intracatheter Once PRN Magrinat, Virgie Dad, MD      . sodium chloride  flush (NS) 0.9 % injection 10 mL  10 mL Intracatheter PRN Magrinat, Virgie Dad, MD        SURGICAL HISTORY:  Past Surgical History:  Procedure Laterality Date  . CATARACT EXTRACTION W/ INTRAOCULAR LENS  IMPLANT, BILATERAL  2012  . DECOMPRESSIVE LUMBAR LAMINECTOMY LEVEL 2 N/A 07/29/2013   Procedure: LUMBAR LAMINECTOMY, DECOMPRESSION LUMBAR THREE TO FOUR, FOUR TO FIVE microdiscectomy l3,4 right;  Surgeon: Tobi Bastos, MD;  Location: [facility];  Service: Orthopedics;  Laterality: N/A;  . I & D SUPERIOR RIGHT SHOULDER AND CLOSURE WOUND  01-10-2011   S/P ROTATOR CUFF REPAIR  . IR IMAGING GUIDED PORT INSERTION  04/09/2019  . LUMBAR LAMINECTOMY  1970'S  . LUMBAR LAMINECTOMY/DECOMPRESSION MICRODISCECTOMY N/A 06/27/2016   Procedure: L1 - L2 DISCECTOMY;  Surgeon: Melina Schools, MD;  Location: [place];  Service: Orthopedics;  Laterality: N/A;  . [place]  . REVERSE SHOULDER ARTHROPLASTY Left 03/10/2020   Procedure: REVERSE SHOULDER ARTHROPLASTY;  Surgeon: Justice Britain, MD;  Location: [facility];  Service: Orthopedics;  Laterality: Left;  159min  . RIGHT SHOULDER ARTHROSCOPY/ OPEN DISTAL CLAVICLE RESECTION/ SAD/ OPEN ROTATOR CUFF REPAIR  11-28-2010  . SHOULDER FUSION SURGERY  03/11/2020   Shoulder Surgery , hardware placed  . SHOULDER OPEN ROTATOR CUFF REPAIR  12/19/2011   Procedure: ROTATOR CUFF REPAIR SHOULDER OPEN;  Surgeon: Magnus Sinning, MD;  Location: [facility];  Service: Orthopedics;  Laterality: Right;  RIGHT RECURRENT OPEN REPAIR OF THE ROTATOR CUFF WITH TISSUE MEND GRAFTANTERIOR CHROMIOECTOMY  . VAGINAL HYSTERECTOMY  1979    REVIEW OF SYSTEMS:   Review of Systems  Constitutional: Positive for fatigue. Negative for appetite change, chills, fever and unexpected weight change.  HENT: Negative for mouth sores, nosebleeds, sore throat and trouble swallowing.   Eyes: Negative for eye problems and icterus.  Respiratory: Positive for mild dry cough. Negative for  hemoptysis, shortness of breath and wheezing.   Cardiovascular: Negative for chest pain and leg swelling.  Gastrointestinal: Negative for abdominal pain, constipation, diarrhea, nausea and vomiting.  Genitourinary: Negative for bladder incontinence, difficulty urinating, dysuria, frequency and hematuria.   Musculoskeletal: Positive for left shoulder pain. Negative for back pain, gait problem, neck pain and neck stiffness.  Skin: Negative for itching and rash.  Neurological: Negative for dizziness, extremity weakness, gait problem, headaches, light-headedness and seizures.  Hematological: Negative for adenopathy. Does not bruise/bleed easily.  Psychiatric/Behavioral: Negative for confusion, depression and sleep disturbance. The patient is not nervous/anxious.     PHYSICAL EXAMINATION:  There were no vitals taken for this visit.  ECOG PERFORMANCE STATUS: 2 - Symptomatic, <50% confined to bed  Physical Exam  Constitutional: Oriented to person, place, and time and chronically ill appearing female and in no distress.   HENT:  Head: Normocephalic and atraumatic.  Mouth/Throat: Oropharynx is clear and moist. No oropharyngeal exudate.  Eyes: Conjunctivae are normal. Right eye exhibits no discharge. Left eye exhibits no discharge. No scleral icterus.  Neck: Normal range of motion. Neck supple.  Cardiovascular: Normal rate, regular rhythm, normal heart sounds and intact distal pulses.   Pulmonary/Chest: Effort normal. Decreased breath sounds in the lower lung fields bilaterally. No respiratory distress. No wheezes. No rales.  Abdominal: Soft. Bowel sounds are normal. Exhibits no distension and no mass. There is no tenderness.  Musculoskeletal: Normal range of motion. Exhibits no edema.  Lymphadenopathy:    No cervical adenopathy.  Neurological: Alert and oriented to person, place, and time. Exhibits normal muscle tone. The patient was examined in the wheelchair.  Skin: Skin is warm and dry. No  rash noted. Not diaphoretic. No erythema. No pallor.  Psychiatric: Mood, memory and judgment normal.  Vitals reviewed.  LABORATORY DATA: Lab Results  Component Value Date   WBC 9.0 03/23/2020   HGB 10.2 (L) 03/23/2020   HCT 30.8 (L) 03/23/2020   MCV 98.1 03/23/2020   PLT 311 03/23/2020      Chemistry      Component Value Date/Time   NA 137 03/23/2020 1057   K 4.3 03/23/2020 1057   CL 105 03/23/2020 1057   CO2 24 03/23/2020 1057   BUN 20 03/23/2020 1057   CREATININE 0.77 03/23/2020 1057   CREATININE 0.76 12/21/2019 1100      Component Value Date/Time   CALCIUM 9.1 03/23/2020 1057   ALKPHOS 76 03/23/2020 1057   AST 22 03/23/2020 1057   AST 22 12/21/2019 1100   ALT 14 03/23/2020 1057   ALT 13 12/21/2019 1100   BILITOT 0.5 03/23/2020 1057   BILITOT 0.3 12/21/2019 1100       RADIOGRAPHIC STUDIES:  No results found.   ASSESSMENT/PLAN:  This is a very pleasant 79 year old Caucasian female diagnosed with extensive stage (T1b, and 3, N1 C) small cell lung cancer. She presented with right upper lobe pulmonary nodules in addition to bilateral hilar and right mediastinal as well as left supraclavicular and abdominal lymphadenopathy. She also has metastatic disease to the in the left humerus. She was diagnosed in May 2020.  She is status post palliative radiotherapy to theleft proximal humerusunder the care of Dr. Lisbeth Renshaw in 2020.   She is currently undergoingpalliative systemic chemotherapy with carboplatin for AUC of 5 on day 1, etoposide 100 mg/M2 on days 1, 2 and 3 as well as Imfinzi 1500 mg every 3 weeks with the chemotherapy and Neulasta support. Status post15cycles.Starting from cycle #5 the patienthas beentreated with maintenance treatment with Imfinzi 1500 mg IV every 4 weeks.  She had a pathological fracture in the left humerus and is undergoing radiation to this area. Her last treatment is scheduled for 04/21/20.   Labs were reviewed. Recommend that she  proceed with cycle #16 as scheduled.   I will arrange for a restaging CT scan prior to her next visit to restage her disease.  We will see her back for a follow up visit in 4 weeks for evaluation and to review her scan before starting cycle #17.   The patient was advised to call immediately if she has any concerning symptoms in the interval. The patient voices understanding of current disease status and treatment options and is in agreement with the current care plan. All questions were answered. The patient knows to call the clinic with any problems, questions or concerns. We can certainly see the patient much sooner if necessary   No orders of the defined types were placed in this encounter.    Ragnar Waas L Honesti Seaberg, PA-C 04/16/20

## 2020-04-17 ENCOUNTER — Ambulatory Visit: Payer: Federal, State, Local not specified - PPO

## 2020-04-18 ENCOUNTER — Other Ambulatory Visit: Payer: Self-pay

## 2020-04-18 ENCOUNTER — Ambulatory Visit: Payer: Federal, State, Local not specified - PPO | Admitting: Physician Assistant

## 2020-04-18 ENCOUNTER — Inpatient Hospital Stay: Payer: Federal, State, Local not specified - PPO

## 2020-04-18 ENCOUNTER — Inpatient Hospital Stay: Payer: Federal, State, Local not specified - PPO | Attending: Oncology

## 2020-04-18 ENCOUNTER — Ambulatory Visit: Payer: Federal, State, Local not specified - PPO

## 2020-04-18 ENCOUNTER — Inpatient Hospital Stay (HOSPITAL_BASED_OUTPATIENT_CLINIC_OR_DEPARTMENT_OTHER): Payer: Federal, State, Local not specified - PPO | Admitting: Physician Assistant

## 2020-04-18 ENCOUNTER — Ambulatory Visit
Admission: RE | Admit: 2020-04-18 | Discharge: 2020-04-18 | Disposition: A | Discharge status: Home / Self Care | Payer: Federal, State, Local not specified - PPO | Source: Ambulatory Visit | Attending: Radiation Oncology | Admitting: Radiation Oncology

## 2020-04-18 ENCOUNTER — Other Ambulatory Visit: Payer: Federal, State, Local not specified - PPO

## 2020-04-18 VITALS — BP 165/68 | HR 74 | Temp 98.1°F | Resp 17 | Ht 62.0 in | Wt 133.0 lb

## 2020-04-18 DIAGNOSIS — K5900 Constipation, unspecified: Secondary | ICD-10-CM | POA: Insufficient documentation

## 2020-04-18 DIAGNOSIS — C778 Secondary and unspecified malignant neoplasm of lymph nodes of multiple regions: Secondary | ICD-10-CM | POA: Insufficient documentation

## 2020-04-18 DIAGNOSIS — Z1211 Encounter for screening for malignant neoplasm of colon: Secondary | ICD-10-CM

## 2020-04-18 DIAGNOSIS — Z5112 Encounter for antineoplastic immunotherapy: Secondary | ICD-10-CM | POA: Insufficient documentation

## 2020-04-18 DIAGNOSIS — R937 Abnormal findings on diagnostic imaging of other parts of musculoskeletal system: Secondary | ICD-10-CM

## 2020-04-18 DIAGNOSIS — C7951 Secondary malignant neoplasm of bone: Secondary | ICD-10-CM | POA: Insufficient documentation

## 2020-04-18 DIAGNOSIS — M84522A Pathological fracture in neoplastic disease, left humerus, initial encounter for fracture: Secondary | ICD-10-CM | POA: Insufficient documentation

## 2020-04-18 DIAGNOSIS — Z79899 Other long term (current) drug therapy: Secondary | ICD-10-CM | POA: Insufficient documentation

## 2020-04-18 DIAGNOSIS — C3411 Malignant neoplasm of upper lobe, right bronchus or lung: Secondary | ICD-10-CM | POA: Diagnosis present

## 2020-04-18 DIAGNOSIS — Z95828 Presence of other vascular implants and grafts: Secondary | ICD-10-CM

## 2020-04-18 LAB — COMPREHENSIVE METABOLIC PANEL
ALT: 13 U/L (ref 0–44)
AST: 19 U/L (ref 15–41)
Albumin: 3.4 g/dL — ABNORMAL LOW (ref 3.5–5.0)
Alkaline Phosphatase: 77 U/L (ref 38–126)
Anion gap: 9 (ref 5–15)
BUN: 24 mg/dL — ABNORMAL HIGH (ref 8–23)
CO2: 25 mmol/L (ref 22–32)
Calcium: 9.1 mg/dL (ref 8.9–10.3)
Chloride: 103 mmol/L (ref 98–111)
Creatinine, Ser: 0.74 mg/dL (ref 0.44–1.00)
GFR calc Af Amer: >60 mL/min (ref >60)
GFR calc non Af Amer: >60 mL/min (ref >60)
Glucose, Bld: 113 mg/dL — ABNORMAL HIGH (ref 70–99)
Potassium: 3.9 mmol/L (ref 3.5–5.1)
Sodium: 137 mmol/L (ref 135–145)
Total Bilirubin: 0.4 mg/dL (ref 0.3–1.2)
Total Protein: 6.9 g/dL (ref 6.5–8.1)

## 2020-04-18 LAB — TSH: TSH: 1.715 u[IU]/mL (ref 0.308–3.960)

## 2020-04-18 LAB — CBC WITH DIFFERENTIAL/PLATELET
Abs Immature Granulocytes: 0.02 10*3/uL (ref 0.00–0.07)
Basophils Absolute: 0 10*3/uL (ref 0.0–0.1)
Basophils Relative: 0 %
Eosinophils Absolute: 0.2 10*3/uL (ref 0.0–0.5)
Eosinophils Relative: 2 %
HCT: 33.3 % — ABNORMAL LOW (ref 36.0–46.0)
Hemoglobin: 11.1 g/dL — ABNORMAL LOW (ref 12.0–15.0)
Immature Granulocytes: 0 %
Lymphocytes Relative: 16 %
Lymphs Abs: 1.2 10*3/uL (ref 0.7–4.0)
MCH: 32.6 pg (ref 26.0–34.0)
MCHC: 33.3 g/dL (ref 30.0–36.0)
MCV: 97.9 fL (ref 80.0–100.0)
Monocytes Absolute: 0.8 10*3/uL (ref 0.1–1.0)
Monocytes Relative: 11 %
Neutro Abs: 5.4 10*3/uL (ref 1.7–7.7)
Neutrophils Relative %: 71 %
Platelets: 243 10*3/uL (ref 150–400)
RBC: 3.4 MIL/uL — ABNORMAL LOW (ref 3.87–5.11)
RDW: 12.7 % (ref 11.5–15.5)
WBC: 7.7 10*3/uL (ref 4.0–10.5)
nRBC: 0 % (ref 0.0–0.2)

## 2020-04-18 MED ORDER — SODIUM CHLORIDE 0.9 % IV SOLN
Freq: Once | INTRAVENOUS | Status: AC
  Filled 2020-04-18: qty 250

## 2020-04-18 MED ORDER — HEPARIN SOD (PORK) LOCK FLUSH 100 UNIT/ML IV SOLN
500.0000 [IU] | Freq: Once | INTRAVENOUS | Status: AC | PRN
  Administered 2020-04-18: 500 [IU]
  Filled 2020-04-18: qty 5

## 2020-04-18 MED ORDER — SODIUM CHLORIDE 0.9% FLUSH
10.0000 mL | INTRAVENOUS | Status: DC | PRN
  Administered 2020-04-18: 10 mL
  Filled 2020-04-18: qty 10

## 2020-04-18 MED ORDER — SODIUM CHLORIDE 0.9 % IV SOLN
1500.0000 mg | Freq: Once | INTRAVENOUS | Status: AC
  Administered 2020-04-18: 1500 mg via INTRAVENOUS
  Filled 2020-04-18: qty 30

## 2020-04-18 NOTE — Patient Instructions (Signed)
[facility] Discharge Instructions for Patients Receiving Chemotherapy  Today you received the following chemotherapy agents: durvalumab.  To help prevent nausea and vomiting after your treatment, we encourage you to take your nausea medication as directed.   If you develop nausea and vomiting that is not controlled by your nausea medication, call the clinic.   BELOW ARE SYMPTOMS THAT SHOULD BE REPORTED IMMEDIATELY:  *FEVER GREATER THAN 100.5 F  *CHILLS WITH OR WITHOUT FEVER  NAUSEA AND VOMITING THAT IS NOT CONTROLLED WITH YOUR NAUSEA MEDICATION  *UNUSUAL SHORTNESS OF BREATH  *UNUSUAL BRUISING OR BLEEDING  TENDERNESS IN MOUTH AND THROAT WITH OR WITHOUT PRESENCE OF ULCERS  *URINARY PROBLEMS  *BOWEL PROBLEMS  UNUSUAL RASH Items with * indicate a potential emergency and should be followed up as soon as possible.  Feel free to call the clinic should you have any questions or concerns. The clinic phone number is [phone].  Please show the CHEMO ALERT CARD at check-in to the Emergency Department and triage nurse.   

## 2020-04-19 ENCOUNTER — Ambulatory Visit
Admission: RE | Admit: 2020-04-19 | Discharge: 2020-04-19 | Disposition: A | Discharge status: Home / Self Care | Payer: Federal, State, Local not specified - PPO | Source: Ambulatory Visit | Attending: Radiation Oncology | Admitting: Radiation Oncology

## 2020-04-19 DIAGNOSIS — C7951 Secondary malignant neoplasm of bone: Secondary | ICD-10-CM | POA: Diagnosis not present

## 2020-04-20 ENCOUNTER — Telehealth: Payer: Self-pay | Admitting: Internal Medicine

## 2020-04-20 ENCOUNTER — Ambulatory Visit
Admission: RE | Admit: 2020-04-20 | Discharge: 2020-04-20 | Disposition: A | Discharge status: Home / Self Care | Payer: Federal, State, Local not specified - PPO | Source: Ambulatory Visit | Attending: Radiation Oncology | Admitting: Radiation Oncology

## 2020-04-20 DIAGNOSIS — C7951 Secondary malignant neoplasm of bone: Secondary | ICD-10-CM | POA: Diagnosis not present

## 2020-04-20 NOTE — Telephone Encounter (Signed)
Scheduled per 07/12 los, patient has been called and notified of upcoming appointment.

## 2020-04-21 ENCOUNTER — Ambulatory Visit
Admission: RE | Admit: 2020-04-21 | Discharge: 2020-04-21 | Disposition: A | Discharge status: Home / Self Care | Payer: Federal, State, Local not specified - PPO | Source: Ambulatory Visit | Attending: Radiation Oncology | Admitting: Radiation Oncology

## 2020-04-21 ENCOUNTER — Encounter: Payer: Self-pay | Admitting: Radiation Oncology

## 2020-04-21 DIAGNOSIS — C7951 Secondary malignant neoplasm of bone: Secondary | ICD-10-CM | POA: Diagnosis not present

## 2020-05-12 ENCOUNTER — Ambulatory Visit (HOSPITAL_COMMUNITY)
Admission: RE | Admit: 2020-05-12 | Discharge: 2020-05-12 | Disposition: A | Discharge status: Home / Self Care | Payer: Federal, State, Local not specified - PPO | Source: Ambulatory Visit | Attending: Physician Assistant | Admitting: Physician Assistant

## 2020-05-12 ENCOUNTER — Other Ambulatory Visit: Payer: Self-pay

## 2020-05-12 ENCOUNTER — Encounter (HOSPITAL_COMMUNITY): Payer: Self-pay

## 2020-05-12 DIAGNOSIS — C3411 Malignant neoplasm of upper lobe, right bronchus or lung: Secondary | ICD-10-CM | POA: Diagnosis present

## 2020-05-12 MED ORDER — IOHEXOL 300 MG/ML  SOLN
100.0000 mL | Freq: Once | INTRAMUSCULAR | Status: AC | PRN
  Administered 2020-05-12: 100 mL via INTRAVENOUS

## 2020-05-12 MED ORDER — SODIUM CHLORIDE (PF) 0.9 % IJ SOLN
INTRAMUSCULAR | Status: AC
  Filled 2020-05-12: qty 50

## 2020-05-12 MED ORDER — HEPARIN SOD (PORK) LOCK FLUSH 100 UNIT/ML IV SOLN
500.0000 [IU] | Freq: Once | INTRAVENOUS | Status: AC
  Administered 2020-05-12: 500 [IU] via INTRAVENOUS

## 2020-05-12 MED ORDER — HEPARIN SOD (PORK) LOCK FLUSH 100 UNIT/ML IV SOLN
INTRAVENOUS | Status: AC
  Filled 2020-05-12: qty 5

## 2020-05-12 NOTE — Progress Notes (Signed)
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 Lisbeth Renshaw. 2) Additional palliative radiotherapy to the proximal humerus under the care of Dr. Lisbeth Renshaw. Last treatment expected on 04/21/2020  CURRENT THERAPY: Palliative systemic chemotherapy with carboplatin for AUC of 5 on day 1, etoposide 100 mg/M2 on days 1, 2 and 3 as well as Imfinzi 1500 mg every 3 weeks with the chemotherapy and Neulasta support. First dose on 03/16/2019. Status post16cycles.Starting from cycle #5 the patient will be treated with maintenance treatment with Imfinzi 1500 mg IV every 4 weeks.  INTERVAL HISTORY: Brittany Marks 79 y.o. female returns to the clinic today for a follow up visit. The patient is feeling fairly well today without any concerning complaints except for pain from the site of surgery for her pathological fracture of the left proximal humerus in June 2021. She completed radiation on 04/21/20. She takes ibuprofen for the pain which "usually" helps her pain. She does not want to take a opioid. She is undergoing physical therapy for this currently. The patient continues to tolerate treatment with Imfinzi well without any adverse side effects. Denies any fever, chills, night sweats, or weight loss. Denies any chest pain, shortness of breath, or hemoptysis.She reports her baseline mild dry cough "from time to time".Denies any constipation. She had nausea and diarrhea from having to drink the contrast for her scan. Denies any headache or visual changes.She recently had a repeat CT scan. She  is here today for evaluation and to review her scan before starting cycle #17.    MEDICAL HISTORY: Past Medical History:  Diagnosis Date  . Cataract    Bilateral  . Constipation    pain medication  . Left rotator cuff tear   . Right rotator cuff tear   . SCL CA dx'd 11/2018    ALLERGIES:  has No Known Allergies.  MEDICATIONS:  Current Outpatient Medications  Medication Sig Dispense Refill  . Ascorbic Acid (VITAMIN C PO) Take by mouth daily.     Marland Kitchen b complex vitamins tablet Take 1 tablet by mouth daily.    . COD LIVER OIL PO Take 1 tablet by mouth daily.     . cyclobenzaprine (FLEXERIL) 10 MG tablet Take 1 tablet (10 mg total) by mouth 3 (three) times daily as needed for muscle spasms. 30 tablet 1  . HYDROcodone-acetaminophen (NORCO) 5-325 MG tablet Take 1 tablet by mouth every 6 (six) hours as needed for moderate pain. 20 tablet 0  . hydrocortisone 2.5 % cream Apply topically as needed. (Patient taking differently: Apply 1 application topically daily as needed (Treatment). ) 3.5 g 0  . ibuprofen (ADVIL) 200 MG tablet Take 600 mg by mouth daily.    Marland Kitchen lidocaine-prilocaine (EMLA) cream Apply 1 application topically every 30 (thirty) days.     . Multiple Vitamin (MULTIVITAMIN) tablet Take 1 tablet by mouth daily.    . ondansetron (ZOFRAN) 4 MG tablet Take 1 tablet (4 mg total) by mouth every 8 (eight) hours as needed for nausea or vomiting. 10 tablet 0  . oxymetazoline (AFRIN) 0.05 % nasal spray Place 2 sprays into the nose 2 (two)  times daily as needed (Bloody nose).     Marland Kitchen prochlorperazine (COMPAZINE) 10 MG tablet Take 1 tablet (10 mg total) by mouth every 6 (six) hours as needed for nausea or vomiting. 30 tablet 0  . sodium chloride (OCEAN) 0.65 % nasal spray Place 1 spray into the nose at bedtime.     Marland Kitchen VITAMIN E PO Take 1 tablet by mouth daily.      No current facility-administered medications for this visit.   Facility-Administered Medications Ordered in Other Visits  Medication  Dose Route Frequency Provider Last Rate Last Admin  . heparin lock flush 100 unit/mL  500 Units Intracatheter Once PRN Magrinat, Virgie Dad, MD      . sodium chloride flush (NS) 0.9 % injection 10 mL  10 mL Intracatheter PRN Magrinat, Virgie Dad, MD        SURGICAL HISTORY:  Past Surgical History:  Procedure Laterality Date  . CATARACT EXTRACTION W/ INTRAOCULAR LENS  IMPLANT, BILATERAL  2012  . DECOMPRESSIVE LUMBAR LAMINECTOMY LEVEL 2 N/A 07/29/2013   Procedure: LUMBAR LAMINECTOMY, DECOMPRESSION LUMBAR THREE TO FOUR, FOUR TO FIVE microdiscectomy l3,4 right;  Surgeon: Tobi Bastos, MD;  Location: [facility];  Service: Orthopedics;  Laterality: N/A;  . I & D SUPERIOR RIGHT SHOULDER AND CLOSURE WOUND  01-10-2011   S/P ROTATOR CUFF REPAIR  . IR IMAGING GUIDED PORT INSERTION  04/09/2019  . LUMBAR LAMINECTOMY  1970'S  . LUMBAR LAMINECTOMY/DECOMPRESSION MICRODISCECTOMY N/A 06/27/2016   Procedure: L1 - L2 DISCECTOMY;  Surgeon: Melina Schools, MD;  Location: [facility];  Service: Orthopedics;  Laterality: N/A;  . [place]  . REVERSE SHOULDER ARTHROPLASTY Left 03/10/2020   Procedure: REVERSE SHOULDER ARTHROPLASTY;  Surgeon: Justice Britain, MD;  Location: [facility];  Service: Orthopedics;  Laterality: Left;  118min  . RIGHT SHOULDER ARTHROSCOPY/ OPEN DISTAL CLAVICLE RESECTION/ SAD/ OPEN ROTATOR CUFF REPAIR  11-28-2010  . SHOULDER FUSION SURGERY  03/11/2020   Shoulder Surgery , hardware placed  . SHOULDER OPEN ROTATOR CUFF REPAIR  12/19/2011   Procedure: ROTATOR CUFF REPAIR SHOULDER OPEN;  Surgeon: Magnus Sinning, MD;  Location: [facility];  Service: Orthopedics;  Laterality: Right;  RIGHT RECURRENT OPEN REPAIR OF THE ROTATOR CUFF WITH TISSUE MEND GRAFTANTERIOR CHROMIOECTOMY  . VAGINAL HYSTERECTOMY  1979    REVIEW OF SYSTEMS:   Review of Systems  Constitutional: Positive for fatigue. Negative for appetite change, chills, fever and unexpected weight change.  HENT: Negative for  mouth sores, nosebleeds, sore throat and trouble swallowing.   Eyes: Negative for eye problems and icterus.  Respiratory: Positive for mild dry cough. Negative for hemoptysis, shortness of breath and wheezing.   Cardiovascular: Negative for chest pain and leg swelling.  Gastrointestinal: Negative for abdominal pain, constipation, diarrhea, nausea and vomiting.  Genitourinary: Negative for bladder incontinence, difficulty urinating, dysuria, frequency and hematuria.   Musculoskeletal: Positive for left shoulder pain. Negative for back pain, gait problem, neck pain and neck stiffness.  Skin: Negative for itching and rash.  Neurological: Negative for dizziness, extremity weakness, gait problem, headaches, light-headedness and seizures.  Hematological: Negative for adenopathy. Does not bruise/bleed easily.  Psychiatric/Behavioral: Negative for confusion, depression and sleep disturbance. The patient is not nervous/anxious.       PHYSICAL EXAMINATION:  Blood pressure (!) 147/66, pulse 74, temperature (!) 97.3 F (36.3 C), temperature source Tympanic, resp. rate 16, height 5\' 2"  (1.575 m), weight 131 lb (59.4 kg), SpO2 99 %.  ECOG PERFORMANCE STATUS: 2 - Symptomatic, <  50% confined to bed  Physical Exam  Constitutional: Oriented to person, place, and time and well-developed, well-nourished, and in no distress. No distress.  HENT:  Head: Normocephalic and atraumatic.  Mouth/Throat: Oropharynx is clear and moist. No oropharyngeal exudate.  Eyes: Conjunctivae are normal. Right eye exhibits no discharge. Left eye exhibits no discharge. No scleral icterus.  Neck: Normal range of motion. Neck supple.  Cardiovascular: Normal rate, regular rhythm, normal heart sounds and intact distal pulses.   Pulmonary/Chest: Effort normal and breath sounds normal. No respiratory distress. No wheezes. No rales.  Abdominal: Soft. Bowel sounds are normal. Exhibits no distension and no mass. There is no tenderness.   Musculoskeletal: Normal range of motion. Exhibits no edema.  Lymphadenopathy:    No cervical adenopathy.  Neurological: Alert and oriented to person, place, and time. Exhibits normal muscle tone. Gait normal. Coordination normal.  Skin: Skin is warm and dry. No rash noted. Not diaphoretic. No erythema. No pallor.  Psychiatric: Mood, memory and judgment normal.  Vitals reviewed.  LABORATORY DATA: Lab Results  Component Value Date   WBC 6.8 05/16/2020   HGB 11.5 (L) 05/16/2020   HCT 34.2 (L) 05/16/2020   MCV 93.2 05/16/2020   PLT 212 05/16/2020      Chemistry      Component Value Date/Time   NA 135 05/16/2020 1141   K 4.1 05/16/2020 1141   CL 103 05/16/2020 1141   CO2 24 05/16/2020 1141   BUN 15 05/16/2020 1141   CREATININE 0.69 05/16/2020 1141   CREATININE 0.76 12/21/2019 1100      Component Value Date/Time   CALCIUM 9.2 05/16/2020 1141   ALKPHOS 78 05/16/2020 1141   AST 18 05/16/2020 1141   AST 22 12/21/2019 1100   ALT 12 05/16/2020 1141   ALT 13 12/21/2019 1100   BILITOT 0.3 05/16/2020 1141   BILITOT 0.3 12/21/2019 1100       RADIOGRAPHIC STUDIES:  CT Chest W Contrast  Result Date: 05/13/2020 CLINICAL DATA:  Primary Cancer Type: Lung Imaging Indication: Routine surveillance Interval therapy since last imaging? Yes Initial Cancer Diagnosis Date: 03/03/2019; Established by: Biopsy-proven Detailed Pathology: Extensive stage small cell lung cancer. Primary Tumor location: Right upper lobe. 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 Surgeries: No thoracic. Hysterectomy. Lumbar laminectomy. Left shoulder arthoplasty. Chemotherapy: Yes; Ongoing? Yes; Most recent administration: 04/18/2020 Immunotherapy?  Yes; Type: Imfinzi, Neulasta; Ongoing? Yes Radiation therapy? Yes Date Range: 04/07/2020 - 04/21/2020; Target: Left humerus Date Range: 04/02/2019 - 04/16/2019; Target: Left humerus EXAM: CT CHEST, ABDOMEN, AND PELVIS WITH CONTRAST TECHNIQUE: Multidetector CT imaging of the  chest, abdomen and pelvis was performed following the standard protocol during bolus administration of intravenous contrast. CONTRAST:  171mL OMNIPAQUE IOHEXOL 300 MG/ML  SOLN COMPARISON:  Most recent CT chest, abdomen and pelvis 01/20/2020. 02/24/2019 PET-CT. FINDINGS: Cardiovascular: Diffuse atherosclerosis of the aorta, great vessels and coronary arteries. Right IJ Port-A-Cath extends to the superior right atrium. Stable mild cardiac enlargement. No significant pericardial fluid. Mediastinum/Nodes: There are 2 adjacent right paratracheal nodes which appears slightly larger on the axial images, measuring 11 mm on image 21/2 and 13 mm on image 23/2. No definite change is seen on the coronal or sagittal images. No other enlarged mediastinal, hilar or axillary lymph nodes. The thyroid gland, trachea and esophagus demonstrate no significant findings. Lungs/Pleura: No pleural effusion or pneumothorax. Centrilobular and paraseptal emphysema with extensive subpleural reticulation in traction bronchiectasis, greatest at both lung bases. No enlarging pulmonary nodules or confluent airspace opacities. Musculoskeletal/Chest  wall: No chest wall mass or suspicious osseous findings. Interval left shoulder reverse arthroplasty with mild inflammatory changes surrounding the left shoulder. Severe erosive arthropathy at the right glenohumeral joint again noted. CT ABDOMEN AND PELVIS FINDINGS Hepatobiliary: The liver is normal in density without suspicious focal abnormality. No evidence of gallstones, gallbladder wall thickening or biliary dilatation. Pancreas: Unremarkable. No pancreatic ductal dilatation or surrounding inflammatory changes. Spleen: Normal in size without focal abnormality. Adrenals/Urinary Tract: Both adrenal glands appear normal. Stable appearance of the right kidney with a large extrarenal pelvis. No cortical thinning or significant delay in contrast excretion. No evidence of renal mass or urinary tract  calculus. The bladder appears unremarkable. Stomach/Bowel: No evidence of bowel wall thickening, distention or surrounding inflammatory change. Vascular/Lymphatic: There are no enlarged abdominal or pelvic lymph nodes. Stable mild soft tissue thickening in the porta hepatis without recurrent focal mass lesion. Extensive aortic and branch vessel atherosclerosis with suspected short segment occlusion of the right common iliac artery and distal reconstitution. No definite acute vascular findings. The portal, superior mesenteric and splenic veins are patent. Reproductive: Hysterectomy.  No adnexal mass. Other: No ascites or peritoneal nodularity.  Intact abdominal wall. Musculoskeletal: No acute or significant osseous findings. Multilevel thoracolumbar spondylosis post lumbar decompression. IMPRESSION: 1. Two adjacent right paratracheal lymph nodes appear slightly more prominent on the axial images, but no definite change is seen on the coronal or sagittal images. No other evidence of metastatic disease within the chest, abdomen or pelvis. 2. Stable chronic lung disease with subpleural reticulation and traction bronchiectasis greatest at both lung bases. 3. Interval left shoulder reverse arthroplasty. Severe erosive arthropathy at the right glenohumeral joint again noted. 4. Aortic Atherosclerosis (ICD10-I70.0) and Emphysema (ICD10-J43.9). 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 Electronically Signed   By: Richardean Sale M.D.   On: 05/13/2020 09:56   CT Abdomen Pelvis W Contrast  Result Date: 05/13/2020 CLINICAL DATA:  Primary Cancer Type: Lung Imaging Indication: Routine surveillance Interval therapy since last imaging? Yes Initial Cancer Diagnosis Date: 03/03/2019; Established by: Biopsy-proven Detailed Pathology: Extensive stage small cell lung cancer. Primary Tumor location: Right upper lobe. 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 Surgeries: No  thoracic. Hysterectomy. Lumbar laminectomy. Left shoulder arthoplasty. Chemotherapy: Yes; Ongoing? Yes; Most recent administration: 04/18/2020 Immunotherapy?  Yes; Type: Imfinzi, Neulasta; Ongoing? Yes Radiation therapy? Yes Date Range: 04/07/2020 - 04/21/2020; Target: Left humerus Date Range: 04/02/2019 - 04/16/2019; Target: Left humerus EXAM: CT CHEST, ABDOMEN, AND PELVIS WITH CONTRAST TECHNIQUE: Multidetector CT imaging of the chest, abdomen and pelvis was performed following the standard protocol during bolus administration of intravenous contrast. CONTRAST:  143mL OMNIPAQUE IOHEXOL 300 MG/ML  SOLN COMPARISON:  Most recent CT chest, abdomen and pelvis 01/20/2020. 02/24/2019 PET-CT. FINDINGS: Cardiovascular: Diffuse atherosclerosis of the aorta, great vessels and coronary arteries. Right IJ Port-A-Cath extends to the superior right atrium. Stable mild cardiac enlargement. No significant pericardial fluid. Mediastinum/Nodes: There are 2 adjacent right paratracheal nodes which appears slightly larger on the axial images, measuring 11 mm on image 21/2 and 13 mm on image 23/2. No definite change is seen on the coronal or sagittal images. No other enlarged mediastinal, hilar or axillary lymph nodes. The thyroid gland, trachea and esophagus demonstrate no significant findings. Lungs/Pleura: No pleural effusion or pneumothorax. Centrilobular and paraseptal emphysema with extensive subpleural reticulation in traction bronchiectasis, greatest at both lung bases. No enlarging pulmonary nodules or confluent airspace opacities. Musculoskeletal/Chest wall: No chest wall mass or suspicious  osseous findings. Interval left shoulder reverse arthroplasty with mild inflammatory changes surrounding the left shoulder. Severe erosive arthropathy at the right glenohumeral joint again noted. CT ABDOMEN AND PELVIS FINDINGS Hepatobiliary: The liver is normal in density without suspicious focal abnormality. No evidence of gallstones,  gallbladder wall thickening or biliary dilatation. Pancreas: Unremarkable. No pancreatic ductal dilatation or surrounding inflammatory changes. Spleen: Normal in size without focal abnormality. Adrenals/Urinary Tract: Both adrenal glands appear normal. Stable appearance of the right kidney with a large extrarenal pelvis. No cortical thinning or significant delay in contrast excretion. No evidence of renal mass or urinary tract calculus. The bladder appears unremarkable. Stomach/Bowel: No evidence of bowel wall thickening, distention or surrounding inflammatory change. Vascular/Lymphatic: There are no enlarged abdominal or pelvic lymph nodes. Stable mild soft tissue thickening in the porta hepatis without recurrent focal mass lesion. Extensive aortic and branch vessel atherosclerosis with suspected short segment occlusion of the right common iliac artery and distal reconstitution. No definite acute vascular findings. The portal, superior mesenteric and splenic veins are patent. Reproductive: Hysterectomy.  No adnexal mass. Other: No ascites or peritoneal nodularity.  Intact abdominal wall. Musculoskeletal: No acute or significant osseous findings. Multilevel thoracolumbar spondylosis post lumbar decompression. IMPRESSION: 1. Two adjacent right paratracheal lymph nodes appear slightly more prominent on the axial images, but no definite change is seen on the coronal or sagittal images. No other evidence of metastatic disease within the chest, abdomen or pelvis. 2. Stable chronic lung disease with subpleural reticulation and traction bronchiectasis greatest at both lung bases. 3. Interval left shoulder reverse arthroplasty. Severe erosive arthropathy at the right glenohumeral joint again noted. 4. Aortic Atherosclerosis (ICD10-I70.0) and Emphysema (ICD10-J43.9). 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 Electronically Signed   By: Richardean Sale M.D.   On:  05/13/2020 09:56     ASSESSMENT/PLAN:  This is a very pleasant 79 year old Caucasian female diagnosed with extensive stage (T1b, and 3, N1 C) small cell lung cancer. She presented with right upper lobe pulmonary nodules in addition to bilateral hilar and right mediastinal as well as left supraclavicular and abdominal lymphadenopathy. She also has metastatic disease to the in the left humerus. She was diagnosed in May 2020.  She is status post palliative radiotherapy to theleft proximal humerusunder the care of Dr. Lisbeth Renshaw in 2020.   She is currently undergoingpalliative systemic chemotherapy with carboplatin for AUC of 5 on day 1, etoposide 100 mg/M2 on days 1, 2 and 3 as well as Imfinzi 1500 mg every 3 weeks with the chemotherapy and Neulasta support. Status post16cycles.Starting from cycle #5 the patienthas beentreated with maintenance treatment with Imfinzi 1500 mg IV every 4 weeks.  She had a pathological fracture in the left humerus and is undergoing radiation to this area. Last treatment was 04/21/20.   The patient recently had a restaging CT scan. Dr. Julien Nordmann personally and independently reviewed the scan and discussed the results with the patient. The scan showed no evidence for disease progression except for mild changes in adenopathy which warrant monitoring.  Dr. Julien Nordmann recommends that she continue on the same treatment at the same dose.  She will proceed with cycle #17 today as scheduled.   The patient was advised to call immediately if she has any concerning symptoms in the interval. The patient voices understanding of current disease status and treatment options and is in agreement with the current care plan. All questions were answered. The patient knows to call the clinic with  any problems, questions or concerns. We can certainly see the patient much sooner if necessary   No orders of the defined types were placed in this encounter.    Asiana Benninger L Vernida Mcnicholas,  PA-C 05/16/20  ADDENDUM: Hematology/Oncology Attending: I had a face-to-face encounter with the patient today.  I recommended her care plan.  This is a very pleasant 79 years old white female with extensive stage small cell lung cancer initially treated with induction treatment with carboplatin, etoposide and Imfinzi for 4 cycles.  The patient is currently on maintenance treatment with Imfinzi 1500 mg IV every 4 weeks starting from cycle #5 for a total of 12 more cycles. She has been tolerating her treatment with Imfinzi fairly well with no concerning adverse effects. The patient had repeat CT scan of the chest performed recently.  I personally and independently reviewed the scans and discussed the results with the patient today. Her scan showed no concerning findings for disease progression. I recommended for her to continue her current treatment with Imfinzi and she will proceed with cycle #17 today. For the pathologic fracture of the left humerus, she is currently undergoing radiotherapy to this area. The patient will come back for follow-up visit in 4 weeks for evaluation before the next cycle of her treatment. She was advised to call immediately if she has any other concerning symptoms in the interval.  Disclaimer: This note was dictated with voice recognition software. Similar sounding words can inadvertently be transcribed and may be missed upon review. Eilleen Kempf, MD 05/16/20

## 2020-05-16 ENCOUNTER — Inpatient Hospital Stay: Payer: Federal, State, Local not specified - PPO

## 2020-05-16 ENCOUNTER — Other Ambulatory Visit: Payer: Self-pay

## 2020-05-16 ENCOUNTER — Telehealth: Payer: Self-pay | Admitting: Radiation Oncology

## 2020-05-16 ENCOUNTER — Inpatient Hospital Stay: Payer: Federal, State, Local not specified - PPO | Attending: Oncology

## 2020-05-16 ENCOUNTER — Inpatient Hospital Stay (HOSPITAL_BASED_OUTPATIENT_CLINIC_OR_DEPARTMENT_OTHER): Payer: Federal, State, Local not specified - PPO | Admitting: Physician Assistant

## 2020-05-16 ENCOUNTER — Encounter: Payer: Self-pay | Admitting: Physician Assistant

## 2020-05-16 VITALS — BP 147/66 | HR 74 | Temp 97.3°F | Resp 16 | Ht 62.0 in | Wt 131.0 lb

## 2020-05-16 DIAGNOSIS — R110 Nausea: Secondary | ICD-10-CM | POA: Diagnosis not present

## 2020-05-16 DIAGNOSIS — J439 Emphysema, unspecified: Secondary | ICD-10-CM | POA: Insufficient documentation

## 2020-05-16 DIAGNOSIS — Z5112 Encounter for antineoplastic immunotherapy: Secondary | ICD-10-CM | POA: Insufficient documentation

## 2020-05-16 DIAGNOSIS — K5900 Constipation, unspecified: Secondary | ICD-10-CM | POA: Insufficient documentation

## 2020-05-16 DIAGNOSIS — R197 Diarrhea, unspecified: Secondary | ICD-10-CM | POA: Diagnosis not present

## 2020-05-16 DIAGNOSIS — C3411 Malignant neoplasm of upper lobe, right bronchus or lung: Secondary | ICD-10-CM | POA: Diagnosis not present

## 2020-05-16 DIAGNOSIS — C778 Secondary and unspecified malignant neoplasm of lymph nodes of multiple regions: Secondary | ICD-10-CM | POA: Insufficient documentation

## 2020-05-16 DIAGNOSIS — Z95828 Presence of other vascular implants and grafts: Secondary | ICD-10-CM

## 2020-05-16 DIAGNOSIS — R937 Abnormal findings on diagnostic imaging of other parts of musculoskeletal system: Secondary | ICD-10-CM

## 2020-05-16 DIAGNOSIS — C7951 Secondary malignant neoplasm of bone: Secondary | ICD-10-CM | POA: Insufficient documentation

## 2020-05-16 DIAGNOSIS — Z1211 Encounter for screening for malignant neoplasm of colon: Secondary | ICD-10-CM

## 2020-05-16 LAB — COMPREHENSIVE METABOLIC PANEL
ALT: 12 U/L (ref 0–44)
AST: 18 U/L (ref 15–41)
Albumin: 3.5 g/dL (ref 3.5–5.0)
Alkaline Phosphatase: 78 U/L (ref 38–126)
Anion gap: 8 (ref 5–15)
BUN: 15 mg/dL (ref 8–23)
CO2: 24 mmol/L (ref 22–32)
Calcium: 9.2 mg/dL (ref 8.9–10.3)
Chloride: 103 mmol/L (ref 98–111)
Creatinine, Ser: 0.69 mg/dL (ref 0.44–1.00)
GFR calc Af Amer: >60 mL/min (ref >60)
GFR calc non Af Amer: >60 mL/min (ref >60)
Glucose, Bld: 92 mg/dL (ref 70–99)
Potassium: 4.1 mmol/L (ref 3.5–5.1)
Sodium: 135 mmol/L (ref 135–145)
Total Bilirubin: 0.3 mg/dL (ref 0.3–1.2)
Total Protein: 6.9 g/dL (ref 6.5–8.1)

## 2020-05-16 LAB — CBC WITH DIFFERENTIAL/PLATELET
Abs Immature Granulocytes: 0.01 10*3/uL (ref 0.00–0.07)
Basophils Absolute: 0 10*3/uL (ref 0.0–0.1)
Basophils Relative: 0 %
Eosinophils Absolute: 0.3 10*3/uL (ref 0.0–0.5)
Eosinophils Relative: 4 %
HCT: 34.2 % — ABNORMAL LOW (ref 36.0–46.0)
Hemoglobin: 11.5 g/dL — ABNORMAL LOW (ref 12.0–15.0)
Immature Granulocytes: 0 %
Lymphocytes Relative: 16 %
Lymphs Abs: 1.1 10*3/uL (ref 0.7–4.0)
MCH: 31.3 pg (ref 26.0–34.0)
MCHC: 33.6 g/dL (ref 30.0–36.0)
MCV: 93.2 fL (ref 80.0–100.0)
Monocytes Absolute: 0.6 10*3/uL (ref 0.1–1.0)
Monocytes Relative: 8 %
Neutro Abs: 4.8 10*3/uL (ref 1.7–7.7)
Neutrophils Relative %: 72 %
Platelets: 212 10*3/uL (ref 150–400)
RBC: 3.67 MIL/uL — ABNORMAL LOW (ref 3.87–5.11)
RDW: 12.2 % (ref 11.5–15.5)
WBC: 6.8 10*3/uL (ref 4.0–10.5)
nRBC: 0 % (ref 0.0–0.2)

## 2020-05-16 MED ORDER — SODIUM CHLORIDE 0.9% FLUSH
10.0000 mL | INTRAVENOUS | Status: DC | PRN
  Administered 2020-05-16: 10 mL
  Filled 2020-05-16: qty 10

## 2020-05-16 MED ORDER — SODIUM CHLORIDE 0.9 % IV SOLN
1500.0000 mg | Freq: Once | INTRAVENOUS | Status: AC
  Administered 2020-05-16: 1500 mg via INTRAVENOUS
  Filled 2020-05-16: qty 30

## 2020-05-16 MED ORDER — SODIUM CHLORIDE 0.9 % IV SOLN
Freq: Once | INTRAVENOUS | Status: AC
  Filled 2020-05-16: qty 250

## 2020-05-16 MED ORDER — HEPARIN SOD (PORK) LOCK FLUSH 100 UNIT/ML IV SOLN
500.0000 [IU] | Freq: Once | INTRAVENOUS | Status: AC | PRN
  Administered 2020-05-16: 500 [IU]
  Filled 2020-05-16: qty 5

## 2020-05-16 NOTE — Telephone Encounter (Signed)
  Radiation Oncology         [phone] ________________________________  Name: Brittany Marks MRN: [id number]  Date of Service: 05/26/20  DOB: September 02, 1941  Post Treatment Telephone Note  Diagnosis:  Extensive stage small cell carcinoma of the right upper lobe with bony metastasis in the left proximal humerus.  Interval Since Last Radiation:  5 weeks   04/07/20-04/21/20: The left humerus was retreated to 30 Gy in 10 fractions  04/02/2019-04/16/2019:  30 Gy in 10 fractions was given to the left proximal humerus.   Narrative:  The patient was contacted today for routine follow-up. During treatment she did very well with radiotherapy and did not have significant desquamation.   Impression/Plan: 1. 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 I was unable to reach the patient, but on my voicemail let her know that we would be happy to see her at the direction of Dr. Julien Nordmann.  2. PCI. The patient was previously aware that we have asked medical oncology to let us know if she is able to get to a point of being followed in observation at which time we could consider PCI.     Carola Rhine, PAC

## 2020-05-16 NOTE — Patient Instructions (Signed)
[facility] Discharge Instructions for Patients Receiving Chemotherapy  Today you received the following chemotherapy agents: durvalumab.  To help prevent nausea and vomiting after your treatment, we encourage you to take your nausea medication as directed.   If you develop nausea and vomiting that is not controlled by your nausea medication, call the clinic.   BELOW ARE SYMPTOMS THAT SHOULD BE REPORTED IMMEDIATELY:  *FEVER GREATER THAN 100.5 F  *CHILLS WITH OR WITHOUT FEVER  NAUSEA AND VOMITING THAT IS NOT CONTROLLED WITH YOUR NAUSEA MEDICATION  *UNUSUAL SHORTNESS OF BREATH  *UNUSUAL BRUISING OR BLEEDING  TENDERNESS IN MOUTH AND THROAT WITH OR WITHOUT PRESENCE OF ULCERS  *URINARY PROBLEMS  *BOWEL PROBLEMS  UNUSUAL RASH Items with * indicate a potential emergency and should be followed up as soon as possible.  Feel free to call the clinic should you have any questions or concerns. The clinic phone number is [phone].  Please show the CHEMO ALERT CARD at check-in to the Emergency Department and triage nurse.   

## 2020-05-21 NOTE — Progress Notes (Signed)
  Radiation Oncology         [phone] ________________________________  Name: Brittany Marks MRN: [id number]  Date: 04/21/2020  DOB: [birth year]  End of Treatment Note  Diagnosis:   Left shoulder metastasis     Indication for treatment::  palliative       Radiation treatment dates:   04/07/20 - 04/21/20  Site/dose:   The left shoulder was treated to a dose of 30 Gy in 10 fractions using a 3-field isodose plan.  Narrative: The patient tolerated radiation treatment relatively well.     Plan: The patient has completed radiation treatment. The patient will return to radiation oncology clinic for routine followup in one month. I advised the patient to call or return sooner if they have any questions or concerns related to their recovery or treatment. ________________________________  Jodelle Gross, M.D., Ph.D.

## 2020-06-07 NOTE — Progress Notes (Addendum)
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 Lisbeth Renshaw. 2) Additional palliative radiotherapy to the proximal humerus under the care of Dr. Lisbeth Renshaw. Last treatment on 04/21/2020  CURRENT THERAPY: Palliative systemic chemotherapy with carboplatin for AUC of 5 on day 1, etoposide 100 mg/M2 on days 1, 2 and 3 as well as Imfinzi 1500 mg every 3 weeks with the chemotherapy and Neulasta support. First dose on 03/16/2019. Status post17cycles.Starting from cycle #5 the patient will be treated with maintenance treatment with Imfinzi 1500 mg IV every 4 weeks.  INTERVAL HISTORY: FAE BLOSSOM 79 y.o. female returns to the clinic today for a follow up visit.The patient is feeling fairly well today without any concerning complaints except for she developed a rash at the site of the prior radiation/surgery on her left humerus. Denies any pain from the rash and reports mild itching. She has been using neosporin. She also reports a dry back with flaky skin. She completed radiation on 04/21/20 to this region. She notes her pain has been improving somewhat recently.She is undergoing physical therapy for this currently. She saw her orthopedist, Dr. Onnie Graham, on 06/01/20 who recommended continuing physical therapy. The patient continues to tolerate treatment with Imfinziwell without any adverse sideeffects. Denies any fever, chills, or night sweats. She lost a few pounds which she attributes to eating more TV dinners since her arm surgery. Denies any chest pain or hemoptysis.She reports her  baseline dyspnea on exertion. She reports herbaseline mild dry cough.Denies any nausea, vomiting, diarrhea, or constipation. Denies any headache or visual changes. She is here today for evaluation before starting cycle #18.     MEDICAL HISTORY: Past Medical History:  Diagnosis Date  . Cataract    Bilateral  . Constipation    pain medication  . Left rotator cuff tear   . Right rotator cuff tear   . SCL CA dx'd 11/2018    ALLERGIES:  has No Known Allergies.  MEDICATIONS:  Current Outpatient Medications  Medication Sig Dispense Refill  . Ascorbic Acid (VITAMIN C PO) Take by mouth daily.     Marland Kitchen b complex vitamins tablet Take 1 tablet by mouth daily.    . COD LIVER OIL PO Take 1 tablet by mouth daily.     . cyclobenzaprine (FLEXERIL) 10 MG tablet Take 1 tablet (10 mg total) by mouth 3 (three) times daily as needed for muscle spasms. 30 tablet 1  . HYDROcodone-acetaminophen (NORCO) 5-325 MG tablet Take 1 tablet by mouth every 6 (six) hours as needed for moderate pain. 20 tablet 0  . hydrocortisone 2.5 % cream Apply topically as needed. (Patient taking differently: Apply 1 application topically daily as needed (Treatment). ) 3.5 g 0  . ibuprofen (ADVIL) 200 MG tablet Take 600 mg by mouth daily.    Marland Kitchen lidocaine-prilocaine (EMLA) cream Apply 1 application topically every 30 (thirty) days.     . Multiple Vitamin (MULTIVITAMIN) tablet Take 1 tablet by mouth daily.    . ondansetron (ZOFRAN) 4 MG tablet Take 1 tablet (4 mg total) by mouth every 8 (eight) hours as needed for nausea or vomiting. 10 tablet 0  .  oxymetazoline (AFRIN) 0.05 % nasal spray Place 2 sprays into the nose 2 (two) times daily as needed (Bloody nose).     Marland Kitchen prochlorperazine (COMPAZINE) 10 MG tablet Take 1 tablet (10 mg total) by mouth every 6 (six) hours as needed for nausea or vomiting. 30 tablet 0  . sodium chloride (OCEAN) 0.65 % nasal spray Place 1 spray into the nose at bedtime.     Marland Kitchen VITAMIN E PO Take 1 tablet by mouth  daily.      No current facility-administered medications for this visit.   Facility-Administered Medications Ordered in Other Visits  Medication Dose Route Frequency Provider Last Rate Last Admin  . durvalumab (IMFINZI) 1,500 mg in sodium chloride 0.9 % 100 mL chemo infusion  1,500 mg Intravenous Once Curt Bears, MD      . heparin lock flush 100 unit/mL  500 Units Intracatheter Once PRN Magrinat, Virgie Dad, MD      . heparin lock flush 100 unit/mL  500 Units Intracatheter Once PRN Curt Bears, MD      . sodium chloride flush (NS) 0.9 % injection 10 mL  10 mL Intracatheter PRN Magrinat, Virgie Dad, MD      . sodium chloride flush (NS) 0.9 % injection 10 mL  10 mL Intracatheter PRN Curt Bears, MD        SURGICAL HISTORY:  Past Surgical History:  Procedure Laterality Date  . CATARACT EXTRACTION W/ INTRAOCULAR LENS  IMPLANT, BILATERAL  2012  . DECOMPRESSIVE LUMBAR LAMINECTOMY LEVEL 2 N/A 07/29/2013   Procedure: LUMBAR LAMINECTOMY, DECOMPRESSION LUMBAR THREE TO FOUR, FOUR TO FIVE microdiscectomy l3,4 right;  Surgeon: Tobi Bastos, MD;  Location: [facility];  Service: Orthopedics;  Laterality: N/A;  . I & D SUPERIOR RIGHT SHOULDER AND CLOSURE WOUND  01-10-2011   S/P ROTATOR CUFF REPAIR  . IR IMAGING GUIDED PORT INSERTION  04/09/2019  . LUMBAR LAMINECTOMY  1970'S  . LUMBAR LAMINECTOMY/DECOMPRESSION MICRODISCECTOMY N/A 06/27/2016   Procedure: L1 - L2 DISCECTOMY;  Surgeon: Melina Schools, MD;  Location: [facility];  Service: Orthopedics;  Laterality: N/A;  . [place]  . REVERSE SHOULDER ARTHROPLASTY Left 03/10/2020   Procedure: REVERSE SHOULDER ARTHROPLASTY;  Surgeon: Justice Britain, MD;  Location: [facility];  Service: Orthopedics;  Laterality: Left;  135min  . RIGHT SHOULDER ARTHROSCOPY/ OPEN DISTAL CLAVICLE RESECTION/ SAD/ OPEN ROTATOR CUFF REPAIR  11-28-2010  . SHOULDER FUSION SURGERY  03/11/2020   Shoulder Surgery , hardware placed  . SHOULDER OPEN ROTATOR CUFF REPAIR   12/19/2011   Procedure: ROTATOR CUFF REPAIR SHOULDER OPEN;  Surgeon: Magnus Sinning, MD;  Location: [facility];  Service: Orthopedics;  Laterality: Right;  RIGHT RECURRENT OPEN REPAIR OF THE ROTATOR CUFF WITH TISSUE MEND GRAFTANTERIOR CHROMIOECTOMY  . VAGINAL HYSTERECTOMY  1979    REVIEW OF SYSTEMS:   Constitutional:Positive for fatigue.Negative for appetite change, chills, fever and unexpected weight change.  HENT: Negative for mouth sores, nosebleeds, sore throat and trouble swallowing.  Eyes: Negative for eye problems and icterus.  Respiratory:Positive for mild dry cough and baseline dyspnea with exertion.Negative for hemoptysis and wheezing.  Cardiovascular: Negative for chest pain and leg swelling.  Gastrointestinal: Negative for abdominal pain, constipation, diarrhea, nausea and vomiting.  Genitourinary: Negative for bladder incontinence, difficulty urinating, dysuria, frequency and hematuria.  Musculoskeletal:Positive for left shoulder pain.Negative for back pain, gait problem, neck pain and neck stiffness.  Skin: positive for rash on left upper arm. Dry skin across upper left side of back.  Neurological: Negative for dizziness, extremity weakness, gait problem, headaches, light-headedness and seizures.  Hematological: Negative for adenopathy. Does not bruise/bleed easily.  Psychiatric/Behavioral: Negative for confusion, depression and sleep disturbance. The patient is not nervous/anxious.      PHYSICAL EXAMINATION:  Blood pressure (!) 159/70, pulse 74, temperature 98 F (36.7 C), temperature source Tympanic, resp. rate 18, height 5\' 2"  (1.575 m), weight 127 lb 12.8 oz (58 kg), SpO2 99 %.  ECOG PERFORMANCE STATUS: 1 - Symptomatic but completely ambulatory  Physical Exam  Constitutional: Oriented to person, place, and time and well-developed, well-nourished, and in no distress.  HENT:  Head: Normocephalic and atraumatic.  Mouth/Throat: Oropharynx is  clear and moist. No oropharyngeal exudate.  Eyes: Conjunctivae are normal. Right eye exhibits no discharge. Left eye exhibits no discharge. No scleral icterus.  Neck: Normal range of motion. Neck supple.  Cardiovascular: Normal rate, regular rhythm, normal heart sounds and intact distal pulses.   Pulmonary/Chest: Effort normal and breath sounds normal except crackles noted bilaterally. No respiratory distress. No wheezes.  Abdominal: Soft. Bowel sounds are normal. Exhibits no distension and no mass. There is no tenderness.  Musculoskeletal: Normal range of motion. Exhibits no edema.  Lymphadenopathy:    No cervical adenopathy.  Neurological: Alert and oriented to person, place, and time. Exhibits normal muscle tone. Gait normal. Coordination normal.  Skin: macular papular rash on left upper extremity. No vesicles. Dry skin across left upper back. Skin is warm and dry. Not diaphoretic. No pallor.  Psychiatric: Mood, memory and judgment normal.  Vitals reviewed.  LABORATORY DATA: Lab Results  Component Value Date   WBC 6.6 06/14/2020   HGB 11.8 (L) 06/14/2020   HCT 35.1 (L) 06/14/2020   MCV 90.7 06/14/2020   PLT 227 06/14/2020      Chemistry      Component Value Date/Time   NA 136 06/14/2020 1058   K 4.2 06/14/2020 1058   CL 102 06/14/2020 1058   CO2 28 06/14/2020 1058   BUN 16 06/14/2020 1058   CREATININE 0.71 06/14/2020 1058   CREATININE 0.76 12/21/2019 1100      Component Value Date/Time   CALCIUM 9.3 06/14/2020 1058   ALKPHOS 70 06/14/2020 1058   AST 20 06/14/2020 1058   AST 22 12/21/2019 1100   ALT 13 06/14/2020 1058   ALT 13 12/21/2019 1100   BILITOT 0.3 06/14/2020 1058   BILITOT 0.3 12/21/2019 1100       RADIOGRAPHIC STUDIES:  No results found.   ASSESSMENT/PLAN:  This is a very pleasant 79 year old Caucasian female diagnosed with extensive stage (T1b, and 3, N1 C) small cell lung cancer. She presented with right upper lobe pulmonary nodules in addition to  bilateral hilar and right mediastinal as well as left supraclavicular and abdominal lymphadenopathy. She also has metastatic disease to the in the left humerus. She was diagnosed in May 2020.  She is status post palliative radiotherapy to theleft proximal humerusunder the care of Dr. Nathanial Millman 2020.   She is currently undergoingpalliative systemic chemotherapy with carboplatin for AUC of 5 on day 1, etoposide 100 mg/M2 on days 1, 2 and 3 as well as Imfinzi 1500 mg every 3 weeks with the chemotherapy and Neulasta support. Status post17cycles.Starting from cycle #5 the patienthas beentreated with maintenance treatment with Imfinzi 1500 mg IV every 4 weeks.  She had a pathological fracture in the left humerus underwent additional radiation to this area. Last treatment was 04/21/20.    Labs were reviewed. Recommend that she with  cycle #18 today as scheduled.   We will see her back for a follow up visit in 4 weeks for evaluation before starting cycle #19.   Recommend that she use hydrocortisone cream to this localized rash. Denies pain and reports mild itching. Encouraged to use lotion on the dry skin on the back for now. Discussed with Dr. Julien Nordmann and radiation oncology. Dr. Julien Nordmann recommends that she continue with topical steroids for now. If no improvement or worsening symptoms, he may consider her for a medrol Dosepak next week.   The patient was advised to call immediately if she has any concerning symptoms in the interval. The patient voices understanding of current disease status and treatment options and is in agreement with the current care plan. All questions were answered. The patient knows to call the clinic with any problems, questions or concerns. We can certainly see the patient much sooner if necessary    No orders of the defined types were placed in this encounter.    Dianely Krehbiel L Onis Markoff, PA-C 06/14/20

## 2020-06-14 ENCOUNTER — Ambulatory Visit: Payer: Federal, State, Local not specified - PPO | Admitting: Internal Medicine

## 2020-06-14 ENCOUNTER — Inpatient Hospital Stay: Payer: Federal, State, Local not specified - PPO

## 2020-06-14 ENCOUNTER — Other Ambulatory Visit: Payer: Self-pay | Admitting: Radiation Oncology

## 2020-06-14 ENCOUNTER — Other Ambulatory Visit: Payer: Federal, State, Local not specified - PPO

## 2020-06-14 ENCOUNTER — Telehealth: Payer: Self-pay | Admitting: Physician Assistant

## 2020-06-14 ENCOUNTER — Ambulatory Visit: Payer: Federal, State, Local not specified - PPO

## 2020-06-14 ENCOUNTER — Telehealth: Payer: Self-pay | Admitting: *Deleted

## 2020-06-14 ENCOUNTER — Inpatient Hospital Stay (HOSPITAL_BASED_OUTPATIENT_CLINIC_OR_DEPARTMENT_OTHER): Payer: Federal, State, Local not specified - PPO | Admitting: Physician Assistant

## 2020-06-14 ENCOUNTER — Other Ambulatory Visit: Payer: Self-pay

## 2020-06-14 ENCOUNTER — Inpatient Hospital Stay: Payer: Federal, State, Local not specified - PPO | Attending: Oncology

## 2020-06-14 VITALS — BP 159/70 | HR 74 | Temp 98.0°F | Resp 18 | Ht 62.0 in | Wt 127.8 lb

## 2020-06-14 DIAGNOSIS — K5900 Constipation, unspecified: Secondary | ICD-10-CM | POA: Insufficient documentation

## 2020-06-14 DIAGNOSIS — C3411 Malignant neoplasm of upper lobe, right bronchus or lung: Secondary | ICD-10-CM

## 2020-06-14 DIAGNOSIS — C7951 Secondary malignant neoplasm of bone: Secondary | ICD-10-CM | POA: Diagnosis not present

## 2020-06-14 DIAGNOSIS — C7800 Secondary malignant neoplasm of unspecified lung: Secondary | ICD-10-CM | POA: Diagnosis not present

## 2020-06-14 DIAGNOSIS — Z5112 Encounter for antineoplastic immunotherapy: Secondary | ICD-10-CM

## 2020-06-14 DIAGNOSIS — Z923 Personal history of irradiation: Secondary | ICD-10-CM | POA: Diagnosis not present

## 2020-06-14 DIAGNOSIS — Z95828 Presence of other vascular implants and grafts: Secondary | ICD-10-CM

## 2020-06-14 DIAGNOSIS — Z1211 Encounter for screening for malignant neoplasm of colon: Secondary | ICD-10-CM

## 2020-06-14 DIAGNOSIS — R937 Abnormal findings on diagnostic imaging of other parts of musculoskeletal system: Secondary | ICD-10-CM

## 2020-06-14 LAB — CBC WITH DIFFERENTIAL/PLATELET
Abs Immature Granulocytes: 0.02 10*3/uL (ref 0.00–0.07)
Basophils Absolute: 0 10*3/uL (ref 0.0–0.1)
Basophils Relative: 1 %
Eosinophils Absolute: 0.2 10*3/uL (ref 0.0–0.5)
Eosinophils Relative: 2 %
HCT: 35.1 % — ABNORMAL LOW (ref 36.0–46.0)
Hemoglobin: 11.8 g/dL — ABNORMAL LOW (ref 12.0–15.0)
Immature Granulocytes: 0 %
Lymphocytes Relative: 16 %
Lymphs Abs: 1 10*3/uL (ref 0.7–4.0)
MCH: 30.5 pg (ref 26.0–34.0)
MCHC: 33.6 g/dL (ref 30.0–36.0)
MCV: 90.7 fL (ref 80.0–100.0)
Monocytes Absolute: 0.6 10*3/uL (ref 0.1–1.0)
Monocytes Relative: 9 %
Neutro Abs: 4.8 10*3/uL (ref 1.7–7.7)
Neutrophils Relative %: 72 %
Platelets: 227 10*3/uL (ref 150–400)
RBC: 3.87 MIL/uL (ref 3.87–5.11)
RDW: 12.9 % (ref 11.5–15.5)
WBC: 6.6 10*3/uL (ref 4.0–10.5)
nRBC: 0 % (ref 0.0–0.2)

## 2020-06-14 LAB — COMPREHENSIVE METABOLIC PANEL
ALT: 13 U/L (ref 0–44)
AST: 20 U/L (ref 15–41)
Albumin: 3.5 g/dL (ref 3.5–5.0)
Alkaline Phosphatase: 70 U/L (ref 38–126)
Anion gap: 6 (ref 5–15)
BUN: 16 mg/dL (ref 8–23)
CO2: 28 mmol/L (ref 22–32)
Calcium: 9.3 mg/dL (ref 8.9–10.3)
Chloride: 102 mmol/L (ref 98–111)
Creatinine, Ser: 0.71 mg/dL (ref 0.44–1.00)
GFR calc Af Amer: >60 mL/min (ref >60)
GFR calc non Af Amer: >60 mL/min (ref >60)
Glucose, Bld: 113 mg/dL — ABNORMAL HIGH (ref 70–99)
Potassium: 4.2 mmol/L (ref 3.5–5.1)
Sodium: 136 mmol/L (ref 135–145)
Total Bilirubin: 0.3 mg/dL (ref 0.3–1.2)
Total Protein: 7 g/dL (ref 6.5–8.1)

## 2020-06-14 LAB — TSH: TSH: 1.695 u[IU]/mL (ref 0.308–3.960)

## 2020-06-14 MED ORDER — SODIUM CHLORIDE 0.9 % IV SOLN
Freq: Once | INTRAVENOUS | Status: AC
  Filled 2020-06-14: qty 250

## 2020-06-14 MED ORDER — SODIUM CHLORIDE 0.9 % IV SOLN
1500.0000 mg | Freq: Once | INTRAVENOUS | Status: AC
  Administered 2020-06-14: 1500 mg via INTRAVENOUS
  Filled 2020-06-14: qty 30

## 2020-06-14 MED ORDER — SILVER SULFADIAZINE 1 % EX CREA
1.0000 | TOPICAL_CREAM | Freq: Two times a day (BID) | CUTANEOUS | 1 refills | Status: DC
Start: 2020-06-14 — End: 2022-05-14

## 2020-06-14 MED ORDER — SODIUM CHLORIDE 0.9% FLUSH
10.0000 mL | INTRAVENOUS | Status: DC | PRN
  Administered 2020-06-14: 10 mL
  Filled 2020-06-14: qty 10

## 2020-06-14 MED ORDER — HEPARIN SOD (PORK) LOCK FLUSH 100 UNIT/ML IV SOLN
500.0000 [IU] | Freq: Once | INTRAVENOUS | Status: AC | PRN
  Administered 2020-06-14: 500 [IU]
  Filled 2020-06-14: qty 5

## 2020-06-14 NOTE — Patient Instructions (Signed)
[facility] Discharge Instructions for Patients Receiving Chemotherapy  Today you received the following chemotherapy agents: Imfinzi.  To help prevent nausea and vomiting after your treatment, we encourage you to take your nausea medication as directed.   If you develop nausea and vomiting that is not controlled by your nausea medication, call the clinic.   BELOW ARE SYMPTOMS THAT SHOULD BE REPORTED IMMEDIATELY:  *FEVER GREATER THAN 100.5 F  *CHILLS WITH OR WITHOUT FEVER  NAUSEA AND VOMITING THAT IS NOT CONTROLLED WITH YOUR NAUSEA MEDICATION  *UNUSUAL SHORTNESS OF BREATH  *UNUSUAL BRUISING OR BLEEDING  TENDERNESS IN MOUTH AND THROAT WITH OR WITHOUT PRESENCE OF ULCERS  *URINARY PROBLEMS  *BOWEL PROBLEMS  UNUSUAL RASH Items with * indicate a potential emergency and should be followed up as soon as possible.  Feel free to call the clinic should you have any questions or concerns. The clinic phone number is [phone].  Please show the CHEMO ALERT CARD at check-in to the Emergency Department and triage nurse.   

## 2020-06-14 NOTE — Telephone Encounter (Signed)
Patient present at office for infusion today.  During assessment she complained of ongoing discomfort to burn site on her left arm from radiation that was completed approximately 3 weeks ago.    No visible open areas on the arm but patient has been applying neosporin to the area since she did not have any other radiation creams to apply.  Encouraged that she follow up with Radiation Oncology and advised that I would route inquiry to them to reach out to her to assess on going post radiation burn to the area and make suggestions on appropriate at home treatment to that area to help with recovery from burn to the arm.   Routed to Dr. Ida Rogue nurse

## 2020-06-14 NOTE — Telephone Encounter (Signed)
I spoke to the patient about her rash. She is going to try to use topical hydrocortisone cream for now. If no improvement or worsening symptoms, we discussed that should call us back next week and we may consider sending in a prescription for a medrol dosepack at that time. We would like to hold off on prescribing a medrol dosepack at this point in time since she received immunotherapy today and steroids may impact the effectiveness of her treatment. She expressed understanding. Her rash is not troubling her at this time. She reports mild itching. Denies pain.

## 2020-06-14 NOTE — Telephone Encounter (Signed)
I would favor starting Silvadene. I can send it into her pharmacy, but if it's not better we need to see it.

## 2020-07-11 ENCOUNTER — Inpatient Hospital Stay: Payer: Federal, State, Local not specified - PPO

## 2020-07-11 ENCOUNTER — Ambulatory Visit: Payer: Federal, State, Local not specified - PPO

## 2020-07-11 ENCOUNTER — Inpatient Hospital Stay: Payer: Federal, State, Local not specified - PPO | Attending: Oncology

## 2020-07-11 ENCOUNTER — Other Ambulatory Visit: Payer: Federal, State, Local not specified - PPO

## 2020-07-11 ENCOUNTER — Inpatient Hospital Stay: Payer: Federal, State, Local not specified - PPO | Admitting: Nutrition

## 2020-07-11 ENCOUNTER — Telehealth: Payer: Self-pay | Admitting: Internal Medicine

## 2020-07-11 ENCOUNTER — Other Ambulatory Visit: Payer: Self-pay

## 2020-07-11 ENCOUNTER — Ambulatory Visit: Payer: Federal, State, Local not specified - PPO | Admitting: Internal Medicine

## 2020-07-11 ENCOUNTER — Inpatient Hospital Stay (HOSPITAL_BASED_OUTPATIENT_CLINIC_OR_DEPARTMENT_OTHER): Payer: Federal, State, Local not specified - PPO | Admitting: Internal Medicine

## 2020-07-11 ENCOUNTER — Encounter: Payer: Self-pay | Admitting: Internal Medicine

## 2020-07-11 VITALS — BP 165/67 | HR 74 | Temp 97.6°F | Resp 20 | Ht 62.0 in | Wt 132.3 lb

## 2020-07-11 DIAGNOSIS — Z23 Encounter for immunization: Secondary | ICD-10-CM | POA: Insufficient documentation

## 2020-07-11 DIAGNOSIS — Z9221 Personal history of antineoplastic chemotherapy: Secondary | ICD-10-CM | POA: Insufficient documentation

## 2020-07-11 DIAGNOSIS — C3490 Malignant neoplasm of unspecified part of unspecified bronchus or lung: Secondary | ICD-10-CM | POA: Diagnosis not present

## 2020-07-11 DIAGNOSIS — K5900 Constipation, unspecified: Secondary | ICD-10-CM | POA: Insufficient documentation

## 2020-07-11 DIAGNOSIS — C3411 Malignant neoplasm of upper lobe, right bronchus or lung: Secondary | ICD-10-CM | POA: Diagnosis not present

## 2020-07-11 DIAGNOSIS — Z5112 Encounter for antineoplastic immunotherapy: Secondary | ICD-10-CM

## 2020-07-11 DIAGNOSIS — C7951 Secondary malignant neoplasm of bone: Secondary | ICD-10-CM | POA: Insufficient documentation

## 2020-07-11 DIAGNOSIS — Z79899 Other long term (current) drug therapy: Secondary | ICD-10-CM | POA: Insufficient documentation

## 2020-07-11 DIAGNOSIS — Z1211 Encounter for screening for malignant neoplasm of colon: Secondary | ICD-10-CM

## 2020-07-11 DIAGNOSIS — R937 Abnormal findings on diagnostic imaging of other parts of musculoskeletal system: Secondary | ICD-10-CM

## 2020-07-11 DIAGNOSIS — C778 Secondary and unspecified malignant neoplasm of lymph nodes of multiple regions: Secondary | ICD-10-CM | POA: Diagnosis not present

## 2020-07-11 DIAGNOSIS — Z95828 Presence of other vascular implants and grafts: Secondary | ICD-10-CM

## 2020-07-11 LAB — COMPREHENSIVE METABOLIC PANEL
ALT: 15 U/L (ref 0–44)
AST: 20 U/L (ref 15–41)
Albumin: 3.4 g/dL — ABNORMAL LOW (ref 3.5–5.0)
Alkaline Phosphatase: 74 U/L (ref 38–126)
Anion gap: 4 — ABNORMAL LOW (ref 5–15)
BUN: 18 mg/dL (ref 8–23)
CO2: 27 mmol/L (ref 22–32)
Calcium: 9.2 mg/dL (ref 8.9–10.3)
Chloride: 104 mmol/L (ref 98–111)
Creatinine, Ser: 0.67 mg/dL (ref 0.44–1.00)
GFR calc Af Amer: >60 mL/min (ref >60)
GFR calc non Af Amer: >60 mL/min (ref >60)
Glucose, Bld: 101 mg/dL — ABNORMAL HIGH (ref 70–99)
Potassium: 4 mmol/L (ref 3.5–5.1)
Sodium: 135 mmol/L (ref 135–145)
Total Bilirubin: 0.3 mg/dL (ref 0.3–1.2)
Total Protein: 6.8 g/dL (ref 6.5–8.1)

## 2020-07-11 LAB — CBC WITH DIFFERENTIAL/PLATELET
Abs Immature Granulocytes: 0.03 10*3/uL (ref 0.00–0.07)
Basophils Absolute: 0 10*3/uL (ref 0.0–0.1)
Basophils Relative: 0 %
Eosinophils Absolute: 0.2 10*3/uL (ref 0.0–0.5)
Eosinophils Relative: 2 %
HCT: 33.6 % — ABNORMAL LOW (ref 36.0–46.0)
Hemoglobin: 11.3 g/dL — ABNORMAL LOW (ref 12.0–15.0)
Immature Granulocytes: 0 %
Lymphocytes Relative: 17 %
Lymphs Abs: 1.5 10*3/uL (ref 0.7–4.0)
MCH: 30.5 pg (ref 26.0–34.0)
MCHC: 33.6 g/dL (ref 30.0–36.0)
MCV: 90.8 fL (ref 80.0–100.0)
Monocytes Absolute: 0.7 10*3/uL (ref 0.1–1.0)
Monocytes Relative: 8 %
Neutro Abs: 6.2 10*3/uL (ref 1.7–7.7)
Neutrophils Relative %: 73 %
Platelets: 211 10*3/uL (ref 150–400)
RBC: 3.7 MIL/uL — ABNORMAL LOW (ref 3.87–5.11)
RDW: 14 % (ref 11.5–15.5)
WBC: 8.7 10*3/uL (ref 4.0–10.5)
nRBC: 0 % (ref 0.0–0.2)

## 2020-07-11 LAB — TSH: TSH: 1.865 u[IU]/mL (ref 0.308–3.960)

## 2020-07-11 MED ORDER — SODIUM CHLORIDE 0.9 % IV SOLN
Freq: Once | INTRAVENOUS | Status: AC
  Filled 2020-07-11: qty 250

## 2020-07-11 MED ORDER — SODIUM CHLORIDE 0.9 % IV SOLN
1500.0000 mg | Freq: Once | INTRAVENOUS | Status: AC
  Administered 2020-07-11: 1500 mg via INTRAVENOUS
  Filled 2020-07-11: qty 30

## 2020-07-11 MED ORDER — SODIUM CHLORIDE 0.9% FLUSH
10.0000 mL | INTRAVENOUS | Status: DC | PRN
  Administered 2020-07-11: 10 mL
  Filled 2020-07-11: qty 10

## 2020-07-11 MED ORDER — HEPARIN SOD (PORK) LOCK FLUSH 100 UNIT/ML IV SOLN
500.0000 [IU] | Freq: Once | INTRAVENOUS | Status: AC | PRN
  Administered 2020-07-11: 500 [IU]
  Filled 2020-07-11: qty 5

## 2020-07-11 MED ORDER — INFLUENZA VAC A&B SA ADJ QUAD 0.5 ML IM PRSY
0.5000 mL | PREFILLED_SYRINGE | Freq: Once | INTRAMUSCULAR | Status: AC
  Administered 2020-07-11: 0.5 mL via INTRAMUSCULAR

## 2020-07-11 MED ORDER — INFLUENZA VAC A&B SA ADJ QUAD 0.5 ML IM PRSY
PREFILLED_SYRINGE | INTRAMUSCULAR | Status: AC
  Filled 2020-07-11: qty 0.5

## 2020-07-11 NOTE — Telephone Encounter (Signed)
Scheduled appt per 10/4 los - gave patient AVS and calender per los

## 2020-07-11 NOTE — Progress Notes (Signed)
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  PRIOR THERAPY: None  CURRENT THERAPY: Palliative systemic chemotherapy with carboplatin for AUC of 5 on day 1, etoposide 100 mg/M2 on days 1, 2 and 3 as well as Imfinzi 1500 mg every 3 weeks with the chemotherapy and Neulasta support. First dose on 03/16/2019. Status post 18 cycles.   Starting from cycle #5 the patient will be treated with maintenance treatment with Imfinzi 1500 mg IV every 4 weeks.  INTERVAL HISTORY: Brittany Marks 79 y.o. female returns to the clinic today for follow-up visit.  The patient is feeling fine today with no concerning complaints except for mild fatigue.  She denied having any current chest pain, shortness of breath, cough or hemoptysis.  She denied having any fever or chills.  She has no nausea, vomiting, diarrhea or constipation.  She denied having any headache or visual changes.  She has no recent weight loss or night sweats.  The patient is here today for evaluation before starting cycle #19 of her treatment.  MEDICAL HISTORY: Past Medical History:  Diagnosis Date  . Cataract    Bilateral  . Constipation    pain medication  . Left rotator cuff tear   . Right rotator cuff tear   . SCL CA dx'd 11/2018    ALLERGIES:  has No Known Allergies.  MEDICATIONS:  Current Outpatient Medications  Medication Sig Dispense Refill  . Ascorbic Acid (VITAMIN C PO) Take by mouth daily.     Marland Kitchen b complex vitamins tablet Take 1 tablet by mouth daily.    . COD LIVER OIL PO Take 1 tablet by mouth daily.     . cyclobenzaprine  (FLEXERIL) 10 MG tablet Take 1 tablet (10 mg total) by mouth 3 (three) times daily as needed for muscle spasms. 30 tablet 1  . HYDROcodone-acetaminophen (NORCO) 5-325 MG tablet Take 1 tablet by mouth every 6 (six) hours as needed for moderate pain. 20 tablet 0  . hydrocortisone 2.5 % cream Apply topically as needed. (Patient taking differently: Apply 1 application topically daily as needed (Treatment). ) 3.5 g 0  . ibuprofen (ADVIL) 200 MG tablet Take 600 mg by mouth daily.    Marland Kitchen lidocaine-prilocaine (EMLA) cream Apply 1 application topically every 30 (thirty) days.     . Multiple Vitamin (MULTIVITAMIN) tablet Take 1 tablet by mouth daily.    . ondansetron (ZOFRAN) 4 MG tablet Take 1 tablet (4 mg total) by mouth every 8 (eight) hours as needed for nausea or vomiting. 10 tablet 0  . oxymetazoline (AFRIN) 0.05 % nasal spray Place 2 sprays into the nose 2 (two) times daily as needed (Bloody nose).     Marland Kitchen prochlorperazine (COMPAZINE) 10 MG tablet Take 1 tablet (10 mg total) by mouth every 6 (six) hours as needed for nausea or vomiting. 30 tablet 0  . silver sulfADIAZINE (SILVADENE) 1 % cream Apply 1 application topically 2 (two) times daily. 50 g 1  . sodium chloride (OCEAN) 0.65 % nasal spray Place 1 spray into the nose at bedtime.     Marland Kitchen VITAMIN E PO Take 1 tablet by mouth daily.  No current facility-administered medications for this visit.   Facility-Administered Medications Ordered in Other Visits  Medication Dose Route Frequency Provider Last Rate Last Admin  . heparin lock flush 100 unit/mL  500 Units Intracatheter Once PRN Magrinat, Virgie Dad, MD      . sodium chloride flush (NS) 0.9 % injection 10 mL  10 mL Intracatheter PRN Magrinat, Virgie Dad, MD      . sodium chloride flush (NS) 0.9 % injection 10 mL  10 mL Intracatheter PRN Magrinat, Virgie Dad, MD   10 mL at 07/11/20 1242    SURGICAL HISTORY:  Past Surgical History:  Procedure Laterality Date  . CATARACT EXTRACTION W/ INTRAOCULAR LENS   IMPLANT, BILATERAL  2012  . DECOMPRESSIVE LUMBAR LAMINECTOMY LEVEL 2 N/A 07/29/2013   Procedure: LUMBAR LAMINECTOMY, DECOMPRESSION LUMBAR THREE TO FOUR, FOUR TO FIVE microdiscectomy l3,4 right;  Surgeon: Tobi Bastos, MD;  Location: [facility];  Service: Orthopedics;  Laterality: N/A;  . I & D SUPERIOR RIGHT SHOULDER AND CLOSURE WOUND  01-10-2011   S/P ROTATOR CUFF REPAIR  . IR IMAGING GUIDED PORT INSERTION  04/09/2019  . LUMBAR LAMINECTOMY  1970'S  . LUMBAR LAMINECTOMY/DECOMPRESSION MICRODISCECTOMY N/A 06/27/2016   Procedure: L1 - L2 DISCECTOMY;  Surgeon: Melina Schools, MD;  Location: [facility];  Service: Orthopedics;  Laterality: N/A;  . [place]  . REVERSE SHOULDER ARTHROPLASTY Left 03/10/2020   Procedure: REVERSE SHOULDER ARTHROPLASTY;  Surgeon: Justice Britain, MD;  Location: [facility];  Service: Orthopedics;  Laterality: Left;  184min  . RIGHT SHOULDER ARTHROSCOPY/ OPEN DISTAL CLAVICLE RESECTION/ SAD/ OPEN ROTATOR CUFF REPAIR  11-28-2010  . SHOULDER FUSION SURGERY  03/11/2020   Shoulder Surgery , hardware placed  . SHOULDER OPEN ROTATOR CUFF REPAIR  12/19/2011   Procedure: ROTATOR CUFF REPAIR SHOULDER OPEN;  Surgeon: Magnus Sinning, MD;  Location: [facility];  Service: Orthopedics;  Laterality: Right;  RIGHT RECURRENT OPEN REPAIR OF THE ROTATOR CUFF WITH TISSUE MEND GRAFTANTERIOR CHROMIOECTOMY  . VAGINAL HYSTERECTOMY  1979    REVIEW OF SYSTEMS:  A comprehensive review of systems was negative except for: Constitutional: positive for fatigue   PHYSICAL EXAMINATION: General appearance: alert, cooperative, fatigued and no distress Head: Normocephalic, without obvious abnormality, atraumatic Neck: no adenopathy, no JVD, supple, symmetrical, trachea midline and thyroid not enlarged, symmetric, no tenderness/mass/nodules Lymph nodes: Cervical, supraclavicular, and axillary nodes normal. Resp: clear to auscultation bilaterally Back: symmetric, no curvature. ROM normal.  No CVA tenderness. Cardio: regular rate and rhythm, S1, S2 normal, no murmur, click, rub or gallop GI: soft, non-tender; bowel sounds normal; no masses,  no organomegaly Extremities: extremities normal, atraumatic, no cyanosis or edema   ECOG PERFORMANCE STATUS: 1 - Symptomatic but completely ambulatory  Blood pressure (!) 165/67, pulse 74, temperature 97.6 F (36.4 C), temperature source Tympanic, resp. rate 20, height 5\' 2"  (1.575 m), weight 132 lb 4.8 oz (60 kg), SpO2 96 %.  LABORATORY DATA: Lab Results  Component Value Date   WBC 8.7 07/11/2020   HGB 11.3 (L) 07/11/2020   HCT 33.6 (L) 07/11/2020   MCV 90.8 07/11/2020   PLT 211 07/11/2020      Chemistry      Component Value Date/Time   NA 136 06/14/2020 1058   K 4.2 06/14/2020 1058   CL 102 06/14/2020 1058   CO2 28 06/14/2020 1058   BUN 16 06/14/2020 1058   CREATININE 0.71 06/14/2020 1058   CREATININE 0.76 12/21/2019 1100  Component Value Date/Time   CALCIUM 9.3 06/14/2020 1058   ALKPHOS 70 06/14/2020 1058   AST 20 06/14/2020 1058   AST 22 12/21/2019 1100   ALT 13 06/14/2020 1058   ALT 13 12/21/2019 1100   BILITOT 0.3 06/14/2020 1058   BILITOT 0.3 12/21/2019 1100       RADIOGRAPHIC STUDIES: No results found.  ASSESSMENT AND PLAN: This is a very pleasant 79 years old white female with extensive stage small cell lung cancer and she is currently undergoing treatment with carboplatin, etoposide and Imfinzi status post 5 cycles.  Starting from cycle #6 the patient is on maintenance treatment with single agent Imfinzi every 4 weeks status post 18 cycles of total treatment. She has been tolerating this treatment well with no concerning adverse effects. I recommended for the patient to proceed with cycle #19 today as planned. I will see her back for follow-up visit in 4 weeks for evaluation with repeat CT scan of the chest, abdomen pelvis for restaging of her disease. The patient was advised to call immediately if  she has any concerning symptoms in the interval. The patient voices understanding of current disease status and treatment options and is in agreement with the current care plan.  All questions were answered. The patient knows to call the clinic with any problems, questions or concerns. We can certainly see the patient much sooner if necessary.  Disclaimer: This note was dictated with voice recognition software. Similar sounding words can inadvertently be transcribed and may not be corrected upon review.

## 2020-07-11 NOTE — Patient Instructions (Signed)
[facility] Discharge Instructions for Patients Receiving Chemotherapy  Today you received the following chemotherapy agents: durvalumab.  To help prevent nausea and vomiting after your treatment, we encourage you to take your nausea medication as directed.   If you develop nausea and vomiting that is not controlled by your nausea medication, call the clinic.   BELOW ARE SYMPTOMS THAT SHOULD BE REPORTED IMMEDIATELY:  *FEVER GREATER THAN 100.5 F  *CHILLS WITH OR WITHOUT FEVER  NAUSEA AND VOMITING THAT IS NOT CONTROLLED WITH YOUR NAUSEA MEDICATION  *UNUSUAL SHORTNESS OF BREATH  *UNUSUAL BRUISING OR BLEEDING  TENDERNESS IN MOUTH AND THROAT WITH OR WITHOUT PRESENCE OF ULCERS  *URINARY PROBLEMS  *BOWEL PROBLEMS  UNUSUAL RASH Items with * indicate a potential emergency and should be followed up as soon as possible.  Feel free to call the clinic should you have any questions or concerns. The clinic phone number is [phone].  Please show the [place] at check-in to the Emergency Department and triage nurse.  Influenza Virus Vaccine injection What is this medicine? INFLUENZA VIRUS VACCINE (in floo EN zuh VAHY ruhs vak SEEN) helps to reduce the risk of getting influenza also known as the flu. The vaccine only helps protect you against some strains of the flu. This medicine may be used for other purposes; ask your health care provider or pharmacist if you have questions. COMMON BRAND NAME(S): Afluria, Afluria Quadrivalent, Agriflu, Alfuria, FLUAD, Fluarix, Fluarix Quadrivalent, Flublok, Flublok Quadrivalent, FLUCELVAX, FLUCELVAX Quadrivalent, Flulaval, Flulaval Quadrivalent, Fluvirin, Fluzone, Fluzone High-Dose, Fluzone Intradermal, Fluzone Quadrivalent What should I tell my health care provider before I take this medicine? They need to know if you have any of these conditions:  bleeding disorder like hemophilia  fever or infection  Guillain-Barre  syndrome or other neurological problems  immune system problems  infection with the human immunodeficiency virus (HIV) or AIDS  low blood platelet counts  multiple sclerosis  an unusual or allergic reaction to influenza virus vaccine, latex, other medicines, foods, dyes, or preservatives. Different brands of vaccines contain different allergens. Some may contain latex or eggs. Talk to your doctor about your allergies to make sure that you get the right vaccine.  pregnant or trying to get pregnant  breast-feeding How should I use this medicine? This vaccine is for injection into a muscle or under the skin. It is given by a health care professional. A copy of Vaccine Information Statements will be given before each vaccination. Read this sheet carefully each time. The sheet may change frequently. Talk to your healthcare provider to see which vaccines are right for you. Some vaccines should not be used in all age groups. Overdosage: If you think you have taken too much of this medicine contact a poison control center or emergency room at once. NOTE: This medicine is only for you. Do not share this medicine with others. What if I miss a dose? This does not apply. What may interact with this medicine?  chemotherapy or radiation therapy  medicines that lower your immune system like etanercept, anakinra, infliximab, and adalimumab  medicines that treat or prevent blood clots like warfarin  phenytoin  steroid medicines like prednisone or cortisone  theophylline  vaccines This list may not describe all possible interactions. Give your health care provider a list of all the medicines, herbs, non-prescription drugs, or dietary supplements you use. Also tell them if you smoke, drink alcohol, or use illegal drugs. Some items may interact with your medicine. What should  I watch for while using this medicine? Report any side effects that do not go away within 3 days to your doctor or health  care professional. Call your health care provider if any unusual symptoms occur within 6 weeks of receiving this vaccine. You may still catch the flu, but the illness is not usually as bad. You cannot get the flu from the vaccine. The vaccine will not protect against colds or other illnesses that may cause fever. The vaccine is needed every year. What side effects may I notice from receiving this medicine? Side effects that you should report to your doctor or health care professional as soon as possible:  allergic reactions like skin rash, itching or hives, swelling of the face, lips, or tongue Side effects that usually do not require medical attention (report to your doctor or health care professional if they continue or are bothersome):  fever  headache  muscle aches and pains  pain, tenderness, redness, or swelling at the injection site  tiredness This list may not describe all possible side effects. Call your doctor for medical advice about side effects. You may report side effects to FDA at 1-800-FDA-1088. Where should I keep my medicine? The vaccine will be given by a health care professional in a clinic, pharmacy, doctor's office, or other health care setting. You will not be given vaccine doses to store at home. NOTE: This sheet is a summary. It may not cover all possible information. If you have questions about this medicine, talk to your doctor, pharmacist, or health care provider.  2020 Elsevier/Gold Standard (2018-08-19 08:45:43)

## 2020-07-12 NOTE — Progress Notes (Signed)
Nutrition follow-up completed with patient during infusion for small cell lung cancer. Weight is stable at 132.3 pounds on October 4. Patient denies nutrition impact symptoms. States she is eating fine and has no nutrition questions. She does not feel like she needs to drink oral nutrition supplements because she is eating enough. Inadequate oral intake resolved. No follow-up required at this time.  Please reconsult RD if nutrition needs are identified.

## 2020-08-01 ENCOUNTER — Other Ambulatory Visit: Payer: Federal, State, Local not specified - PPO

## 2020-08-01 ENCOUNTER — Ambulatory Visit: Payer: Federal, State, Local not specified - PPO

## 2020-08-01 ENCOUNTER — Ambulatory Visit: Payer: Federal, State, Local not specified - PPO | Admitting: Physician Assistant

## 2020-08-04 ENCOUNTER — Ambulatory Visit (HOSPITAL_COMMUNITY)
Admission: RE | Admit: 2020-08-04 | Discharge: 2020-08-04 | Disposition: A | Discharge status: Home / Self Care | Payer: Federal, State, Local not specified - PPO | Source: Ambulatory Visit | Attending: Internal Medicine | Admitting: Internal Medicine

## 2020-08-04 ENCOUNTER — Encounter (HOSPITAL_COMMUNITY): Payer: Self-pay

## 2020-08-04 ENCOUNTER — Other Ambulatory Visit: Payer: Self-pay

## 2020-08-04 DIAGNOSIS — C3490 Malignant neoplasm of unspecified part of unspecified bronchus or lung: Secondary | ICD-10-CM | POA: Diagnosis present

## 2020-08-04 MED ORDER — HEPARIN SOD (PORK) LOCK FLUSH 100 UNIT/ML IV SOLN
INTRAVENOUS | Status: AC
  Filled 2020-08-04: qty 5

## 2020-08-04 MED ORDER — IOHEXOL 300 MG/ML  SOLN
100.0000 mL | Freq: Once | INTRAMUSCULAR | Status: AC | PRN
  Administered 2020-08-04: 100 mL via INTRAVENOUS

## 2020-08-04 MED ORDER — HEPARIN SOD (PORK) LOCK FLUSH 100 UNIT/ML IV SOLN
500.0000 [IU] | Freq: Once | INTRAVENOUS | Status: AC
  Administered 2020-08-04: 500 [IU] via INTRAVENOUS

## 2020-08-08 ENCOUNTER — Inpatient Hospital Stay (HOSPITAL_BASED_OUTPATIENT_CLINIC_OR_DEPARTMENT_OTHER): Payer: Federal, State, Local not specified - PPO | Admitting: Internal Medicine

## 2020-08-08 ENCOUNTER — Encounter: Payer: Self-pay | Admitting: Internal Medicine

## 2020-08-08 ENCOUNTER — Inpatient Hospital Stay: Payer: Federal, State, Local not specified - PPO | Attending: Oncology

## 2020-08-08 ENCOUNTER — Inpatient Hospital Stay: Payer: Federal, State, Local not specified - PPO

## 2020-08-08 ENCOUNTER — Other Ambulatory Visit: Payer: Self-pay

## 2020-08-08 VITALS — BP 158/69 | HR 74 | Temp 97.1°F | Resp 18 | Ht 62.0 in | Wt 134.7 lb

## 2020-08-08 DIAGNOSIS — Z95828 Presence of other vascular implants and grafts: Secondary | ICD-10-CM

## 2020-08-08 DIAGNOSIS — Z5112 Encounter for antineoplastic immunotherapy: Secondary | ICD-10-CM | POA: Diagnosis not present

## 2020-08-08 DIAGNOSIS — C3411 Malignant neoplasm of upper lobe, right bronchus or lung: Secondary | ICD-10-CM

## 2020-08-08 DIAGNOSIS — C7952 Secondary malignant neoplasm of bone marrow: Secondary | ICD-10-CM | POA: Diagnosis not present

## 2020-08-08 DIAGNOSIS — C7951 Secondary malignant neoplasm of bone: Secondary | ICD-10-CM

## 2020-08-08 DIAGNOSIS — Z1211 Encounter for screening for malignant neoplasm of colon: Secondary | ICD-10-CM

## 2020-08-08 DIAGNOSIS — J439 Emphysema, unspecified: Secondary | ICD-10-CM | POA: Insufficient documentation

## 2020-08-08 DIAGNOSIS — Z23 Encounter for immunization: Secondary | ICD-10-CM

## 2020-08-08 DIAGNOSIS — Z79899 Other long term (current) drug therapy: Secondary | ICD-10-CM | POA: Diagnosis not present

## 2020-08-08 DIAGNOSIS — K5900 Constipation, unspecified: Secondary | ICD-10-CM | POA: Insufficient documentation

## 2020-08-08 DIAGNOSIS — I700 Atherosclerosis of aorta: Secondary | ICD-10-CM | POA: Diagnosis not present

## 2020-08-08 DIAGNOSIS — R937 Abnormal findings on diagnostic imaging of other parts of musculoskeletal system: Secondary | ICD-10-CM

## 2020-08-08 LAB — COMPREHENSIVE METABOLIC PANEL
ALT: 13 U/L (ref 0–44)
AST: 19 U/L (ref 15–41)
Albumin: 3.5 g/dL (ref 3.5–5.0)
Alkaline Phosphatase: 73 U/L (ref 38–126)
Anion gap: 7 (ref 5–15)
BUN: 24 mg/dL — ABNORMAL HIGH (ref 8–23)
CO2: 25 mmol/L (ref 22–32)
Calcium: 8.7 mg/dL — ABNORMAL LOW (ref 8.9–10.3)
Chloride: 104 mmol/L (ref 98–111)
Creatinine, Ser: 0.69 mg/dL (ref 0.44–1.00)
GFR, Estimated: >60 mL/min (ref >60)
Glucose, Bld: 102 mg/dL — ABNORMAL HIGH (ref 70–99)
Potassium: 3.9 mmol/L (ref 3.5–5.1)
Sodium: 136 mmol/L (ref 135–145)
Total Bilirubin: 0.3 mg/dL (ref 0.3–1.2)
Total Protein: 6.7 g/dL (ref 6.5–8.1)

## 2020-08-08 LAB — CBC WITH DIFFERENTIAL/PLATELET
Abs Immature Granulocytes: 0.02 10*3/uL (ref 0.00–0.07)
Basophils Absolute: 0 10*3/uL (ref 0.0–0.1)
Basophils Relative: 0 %
Eosinophils Absolute: 0.2 10*3/uL (ref 0.0–0.5)
Eosinophils Relative: 3 %
HCT: 32 % — ABNORMAL LOW (ref 36.0–46.0)
Hemoglobin: 11 g/dL — ABNORMAL LOW (ref 12.0–15.0)
Immature Granulocytes: 0 %
Lymphocytes Relative: 18 %
Lymphs Abs: 1.3 10*3/uL (ref 0.7–4.0)
MCH: 32 pg (ref 26.0–34.0)
MCHC: 34.4 g/dL (ref 30.0–36.0)
MCV: 93 fL (ref 80.0–100.0)
Monocytes Absolute: 0.8 10*3/uL (ref 0.1–1.0)
Monocytes Relative: 11 %
Neutro Abs: 4.9 10*3/uL (ref 1.7–7.7)
Neutrophils Relative %: 68 %
Platelets: 229 10*3/uL (ref 150–400)
RBC: 3.44 MIL/uL — ABNORMAL LOW (ref 3.87–5.11)
RDW: 14.3 % (ref 11.5–15.5)
WBC: 7.2 10*3/uL (ref 4.0–10.5)
nRBC: 0 % (ref 0.0–0.2)

## 2020-08-08 LAB — TSH: TSH: 2.654 u[IU]/mL (ref 0.308–3.960)

## 2020-08-08 MED ORDER — SODIUM CHLORIDE 0.9% FLUSH
10.0000 mL | INTRAVENOUS | Status: DC | PRN
  Administered 2020-08-08: 10 mL
  Filled 2020-08-08: qty 10

## 2020-08-08 MED ORDER — SODIUM CHLORIDE 0.9 % IV SOLN
1500.0000 mg | Freq: Once | INTRAVENOUS | Status: AC
  Administered 2020-08-08: 1500 mg via INTRAVENOUS
  Filled 2020-08-08: qty 30

## 2020-08-08 MED ORDER — HEPARIN SOD (PORK) LOCK FLUSH 100 UNIT/ML IV SOLN
500.0000 [IU] | Freq: Once | INTRAVENOUS | Status: AC | PRN
  Administered 2020-08-08: 500 [IU]
  Filled 2020-08-08: qty 5

## 2020-08-08 MED ORDER — SODIUM CHLORIDE 0.9 % IV SOLN
Freq: Once | INTRAVENOUS | Status: AC
  Filled 2020-08-08: qty 250

## 2020-08-08 NOTE — Patient Instructions (Signed)

## 2020-08-08 NOTE — Progress Notes (Signed)
   Covid-19 Vaccination Clinic  Name:  Brittany Marks    MRN: [id number] DOB: November 16, 1940  08/08/2020  [name] was observed post Covid-19 immunization for 15 minutes without incident. She was provided with Vaccine Information Sheet and instruction to access the V-Safe system.   [name] was instructed to call 911 with any severe reactions post vaccine: Marland Kitchen Difficulty breathing  . Swelling of face and throat  . A fast heartbeat  . A bad rash all over body  . Dizziness and weakness

## 2020-08-08 NOTE — Progress Notes (Signed)
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  PRIOR THERAPY: None  CURRENT THERAPY: Palliative systemic chemotherapy with carboplatin for AUC of 5 on day 1, etoposide 100 mg/M2 on days 1, 2 and 3 as well as Imfinzi 1500 mg every 3 weeks with the chemotherapy and Neulasta support. First dose on 03/16/2019. Status post 19 cycles.   Starting from cycle #5 the patient will be treated with maintenance treatment with Imfinzi 1500 mg IV every 4 weeks.  INTERVAL HISTORY: Brittany Marks 79 y.o. female returns to the clinic today for follow-up visit.  The patient is feeling fine today with no concerning complaints except for the diarrhea for 2 days after the oral contrast.  She denied having any current chest pain, shortness of breath, cough or hemoptysis.  She denied having any fever or chills.  She has no nausea, vomiting, diarrhea or constipation.  She denied having any headache or visual changes.  The patient continues to tolerate her treatment with Imfinzi fairly well.  She had repeat CT scan of the chest, abdomen pelvis performed recently and she is here today for evaluation and discussion of her risk her results.  MEDICAL HISTORY: Past Medical History:  Diagnosis Date  . Cataract    Bilateral  . Constipation    pain medication  . Left rotator cuff tear   . Right rotator cuff tear   . SCL CA dx'd 11/2018    ALLERGIES:  has No Known Allergies.  MEDICATIONS:  Current Outpatient Medications  Medication Sig Dispense Refill  . Ascorbic Acid (VITAMIN C PO) Take by mouth daily.     Marland Kitchen b complex  vitamins tablet Take 1 tablet by mouth daily.    . COD LIVER OIL PO Take 1 tablet by mouth daily.     . cyclobenzaprine (FLEXERIL) 10 MG tablet Take 1 tablet (10 mg total) by mouth 3 (three) times daily as needed for muscle spasms. 30 tablet 1  . HYDROcodone-acetaminophen (NORCO) 5-325 MG tablet Take 1 tablet by mouth every 6 (six) hours as needed for moderate pain. 20 tablet 0  . hydrocortisone 2.5 % cream Apply topically as needed. (Patient taking differently: Apply 1 application topically daily as needed (Treatment). ) 3.5 g 0  . ibuprofen (ADVIL) 200 MG tablet Take 600 mg by mouth daily.    Marland Kitchen lidocaine-prilocaine (EMLA) cream Apply 1 application topically every 30 (thirty) days.     . Multiple Vitamin (MULTIVITAMIN) tablet Take 1 tablet by mouth daily.    . ondansetron (ZOFRAN) 4 MG tablet Take 1 tablet (4 mg total) by mouth every 8 (eight) hours as needed for nausea or vomiting. 10 tablet 0  . oxymetazoline (AFRIN) 0.05 % nasal spray Place 2 sprays into the nose 2 (two) times daily as needed (Bloody nose).     Marland Kitchen prochlorperazine (COMPAZINE) 10 MG tablet Take 1 tablet (10 mg total) by mouth every 6 (six) hours as needed for nausea or vomiting. 30 tablet 0  . silver sulfADIAZINE (SILVADENE) 1 % cream Apply 1 application topically 2 (two) times daily. 50 g 1  . sodium chloride (OCEAN) 0.65 % nasal spray Place  1 spray into the nose at bedtime.     Marland Kitchen VITAMIN E PO Take 1 tablet by mouth daily.      No current facility-administered medications for this visit.   Facility-Administered Medications Ordered in Other Visits  Medication Dose Route Frequency Provider Last Rate Last Admin  . heparin lock flush 100 unit/mL  500 Units Intracatheter Once PRN Magrinat, Virgie Dad, MD      . sodium chloride flush (NS) 0.9 % injection 10 mL  10 mL Intracatheter PRN Magrinat, Virgie Dad, MD        SURGICAL HISTORY:  Past Surgical History:  Procedure Laterality Date  . CATARACT EXTRACTION W/ INTRAOCULAR LENS   IMPLANT, BILATERAL  2012  . DECOMPRESSIVE LUMBAR LAMINECTOMY LEVEL 2 N/A 07/29/2013   Procedure: LUMBAR LAMINECTOMY, DECOMPRESSION LUMBAR THREE TO FOUR, FOUR TO FIVE microdiscectomy l3,4 right;  Surgeon: Tobi Bastos, MD;  Location: [facility];  Service: Orthopedics;  Laterality: N/A;  . I & D SUPERIOR RIGHT SHOULDER AND CLOSURE WOUND  01-10-2011   S/P ROTATOR CUFF REPAIR  . IR IMAGING GUIDED PORT INSERTION  04/09/2019  . LUMBAR LAMINECTOMY  1970'S  . LUMBAR LAMINECTOMY/DECOMPRESSION MICRODISCECTOMY N/A 06/27/2016   Procedure: L1 - L2 DISCECTOMY;  Surgeon: Melina Schools, MD;  Location: [facility];  Service: Orthopedics;  Laterality: N/A;  . [place]  . REVERSE SHOULDER ARTHROPLASTY Left 03/10/2020   Procedure: REVERSE SHOULDER ARTHROPLASTY;  Surgeon: Justice Britain, MD;  Location: [facility];  Service: Orthopedics;  Laterality: Left;  162min  . RIGHT SHOULDER ARTHROSCOPY/ OPEN DISTAL CLAVICLE RESECTION/ SAD/ OPEN ROTATOR CUFF REPAIR  11-28-2010  . SHOULDER FUSION SURGERY  03/11/2020   Shoulder Surgery , hardware placed  . SHOULDER OPEN ROTATOR CUFF REPAIR  12/19/2011   Procedure: ROTATOR CUFF REPAIR SHOULDER OPEN;  Surgeon: Magnus Sinning, MD;  Location: [facility];  Service: Orthopedics;  Laterality: Right;  RIGHT RECURRENT OPEN REPAIR OF THE ROTATOR CUFF WITH TISSUE MEND GRAFTANTERIOR CHROMIOECTOMY  . VAGINAL HYSTERECTOMY  1979    REVIEW OF SYSTEMS:  Constitutional: negative Eyes: negative Ears, nose, mouth, throat, and face: negative Respiratory: positive for dyspnea on exertion Cardiovascular: negative Gastrointestinal: negative Genitourinary:negative Integument/breast: negative Hematologic/lymphatic: negative Musculoskeletal:negative Neurological: negative Behavioral/Psych: negative Endocrine: negative Allergic/Immunologic: negative   PHYSICAL EXAMINATION: General appearance: alert, cooperative, fatigued and no distress Head: Normocephalic, without  obvious abnormality, atraumatic Neck: no adenopathy, no JVD, supple, symmetrical, trachea midline and thyroid not enlarged, symmetric, no tenderness/mass/nodules Lymph nodes: Cervical, supraclavicular, and axillary nodes normal. Resp: clear to auscultation bilaterally Back: symmetric, no curvature. ROM normal. No CVA tenderness. Cardio: regular rate and rhythm, S1, S2 normal, no murmur, click, rub or gallop GI: soft, non-tender; bowel sounds normal; no masses,  no organomegaly Extremities: extremities normal, atraumatic, no cyanosis or edema Neurologic: Alert and oriented X 3, normal strength and tone. Normal symmetric reflexes. Normal coordination and gait   ECOG PERFORMANCE STATUS: 1 - Symptomatic but completely ambulatory  Blood pressure (!) 158/69, pulse 74, temperature (!) 97.1 F (36.2 C), temperature source Tympanic, resp. rate 18, height 5\' 2"  (1.575 m), weight 134 lb 11.2 oz (61.1 kg), SpO2 98 %.  LABORATORY DATA: Lab Results  Component Value Date   WBC 7.2 08/08/2020   HGB 11.0 (L) 08/08/2020   HCT 32.0 (L) 08/08/2020   MCV 93.0 08/08/2020   PLT 229 08/08/2020      Chemistry      Component Value Date/Time   NA 136 08/08/2020 1012  K 3.9 08/08/2020 1012   CL 104 08/08/2020 1012   CO2 25 08/08/2020 1012   BUN 24 (H) 08/08/2020 1012   CREATININE 0.69 08/08/2020 1012   CREATININE 0.76 12/21/2019 1100      Component Value Date/Time   CALCIUM 8.7 (L) 08/08/2020 1012   ALKPHOS 73 08/08/2020 1012   AST 19 08/08/2020 1012   AST 22 12/21/2019 1100   ALT 13 08/08/2020 1012   ALT 13 12/21/2019 1100   BILITOT 0.3 08/08/2020 1012   BILITOT 0.3 12/21/2019 1100       RADIOGRAPHIC STUDIES: CT Chest W Contrast  Result Date: 08/04/2020 CLINICAL DATA:  Non-small cell lung cancer.  Restaging. EXAM: CT CHEST, ABDOMEN, AND PELVIS WITH CONTRAST TECHNIQUE: Multidetector CT imaging of the chest, abdomen and pelvis was performed following the standard protocol during bolus  administration of intravenous contrast. CONTRAST:  168mL OMNIPAQUE IOHEXOL 300 MG/ML  SOLN COMPARISON:  05/12/2020 FINDINGS: CT CHEST FINDINGS Cardiovascular: Mild cardiac enlargement. No pericardial effusion identified. Aortic atherosclerosis. Coronary artery calcifications identified. Mediastinum/Nodes: Normal appearance of the thyroid gland. The trachea appears patent and is midline. Normal appearance of the esophagus. Right paratracheal lymph node measures 1.5 cm, image 25/2. Previously 1.3 cm. Adjacent right paratracheal lymph node measures 1.3 cm, image 22/2. Previously 1.2 cm. No hilar adenopathy. No enlarged axillary or supraclavicular lymph nodes. Lungs/Pleura: No pleural effusion. Paraseptal and centrilobular emphysema identified. Bilateral peripheral and lower lung zone predominant interstitial reticulation with traction bronchiectasis is again noted. No suspicious lung nodule identified. 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 No acute or suspicious osseous findings. Severe erosive arthro pathic changes are noted involving the right glenohumeral joint. Previous left shoulder arthroplasty. CT ABDOMEN PELVIS FINDINGS Hepatobiliary: No focal liver abnormality. Gallbladder is unremarkable. Pancreas: Unremarkable. No pancreatic ductal dilatation or surrounding inflammatory changes. Spleen: Normal in size without focal abnormality. Adrenals/Urinary Tract: Normal adrenal glands. Similar appearance of large right extrarenal pelvis. No kidney mass or hydronephrosis identified bilaterally.No urinary bladder is unremarkable. Stomach/Bowel: Stomach is within normal limits. Appendix appears normal. No evidence of bowel wall thickening, distention, or inflammatory changes. Vascular/Lymphatic: Aortic atherosclerosis. No aneurysm. No abdominopelvic adenopathy identified. Reproductive: Status post hysterectomy. No adnexal masses. Other: No free fluid or fluid collections.  Musculoskeletal: No acute or significant osseous findings. Advanced spondylosis noted within the lower thoracic and upper lumbar spine. IMPRESSION: 1. Stable to slight increase in size of right paratracheal lymph nodes. Examination is otherwise stable. 2. Signs of chronic interstitial lung disease with subpleural reticulation and traction bronchiectasis. 3. Coronary artery calcifications noted. Aortic Atherosclerosis (ICD10-I70.0) and Emphysema (ICD10-J43.9). Electronically Signed   By: Kerby Moors M.D.   On: 08/04/2020 16:04   CT Abdomen Pelvis W Contrast  Result Date: 08/04/2020 CLINICAL DATA:  Non-small cell lung cancer.  Restaging. EXAM: CT CHEST, ABDOMEN, AND PELVIS WITH CONTRAST TECHNIQUE: Multidetector CT imaging of the chest, abdomen and pelvis was performed following the standard protocol during bolus administration of intravenous contrast. CONTRAST:  163mL OMNIPAQUE IOHEXOL 300 MG/ML  SOLN COMPARISON:  05/12/2020 FINDINGS: CT CHEST FINDINGS Cardiovascular: Mild cardiac enlargement. No pericardial effusion identified. Aortic atherosclerosis. Coronary artery calcifications identified. Mediastinum/Nodes: Normal appearance of the thyroid gland. The trachea appears patent and is midline. Normal appearance of the esophagus. Right paratracheal lymph node measures 1.5 cm, image 25/2. Previously 1.3 cm. Adjacent right paratracheal lymph node measures 1.3 cm, image 22/2. Previously 1.2 cm. No hilar adenopathy. No enlarged axillary or supraclavicular lymph nodes. Lungs/Pleura: No pleural effusion. Paraseptal  and centrilobular emphysema identified. Bilateral peripheral and lower lung zone predominant interstitial reticulation with traction bronchiectasis is again noted. No suspicious lung nodule identified. 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 No acute or suspicious osseous findings. Severe erosive arthro pathic changes are noted involving the right glenohumeral  joint. Previous left shoulder arthroplasty. CT ABDOMEN PELVIS FINDINGS Hepatobiliary: No focal liver abnormality. Gallbladder is unremarkable. Pancreas: Unremarkable. No pancreatic ductal dilatation or surrounding inflammatory changes. Spleen: Normal in size without focal abnormality. Adrenals/Urinary Tract: Normal adrenal glands. Similar appearance of large right extrarenal pelvis. No kidney mass or hydronephrosis identified bilaterally.No urinary bladder is unremarkable. Stomach/Bowel: Stomach is within normal limits. Appendix appears normal. No evidence of bowel wall thickening, distention, or inflammatory changes. Vascular/Lymphatic: Aortic atherosclerosis. No aneurysm. No abdominopelvic adenopathy identified. Reproductive: Status post hysterectomy. No adnexal masses. Other: No free fluid or fluid collections. Musculoskeletal: No acute or significant osseous findings. Advanced spondylosis noted within the lower thoracic and upper lumbar spine. IMPRESSION: 1. Stable to slight increase in size of right paratracheal lymph nodes. Examination is otherwise stable. 2. Signs of chronic interstitial lung disease with subpleural reticulation and traction bronchiectasis. 3. Coronary artery calcifications noted. Aortic Atherosclerosis (ICD10-I70.0) and Emphysema (ICD10-J43.9). Electronically Signed   By: Kerby Moors M.D.   On: 08/04/2020 16:04    ASSESSMENT AND PLAN: This is a very pleasant 79 years old white female with extensive stage small cell lung cancer and she is currently undergoing treatment with carboplatin, etoposide and Imfinzi status post 5 cycles.  Starting from cycle #6 the patient is on maintenance treatment with single agent Imfinzi every 4 weeks status post 19 cycles of total treatment. The patient continues to tolerate her treatment fairly well with no concerning adverse effects. She had repeat CT scan of the chest, abdomen pelvis performed recently.  I personally and independently reviewed the  scans and discussed the results with the patient today. Her scan showed no concerning findings for disease progression. I recommended for her to continue her current treatment with Imfinzi and she will proceed with cycle #20 today. The patient will come back for follow-up visit in 4 weeks for evaluation before the next cycle of her treatment. She was advised to call immediately if she has any concerning symptoms in the interval. The patient voices understanding of current disease status and treatment options and is in agreement with the current care plan.  All questions were answered. The patient knows to call the clinic with any problems, questions or concerns. We can certainly see the patient much sooner if necessary.  Disclaimer: This note was dictated with voice recognition software. Similar sounding words can inadvertently be transcribed and may not be corrected upon review.

## 2020-08-08 NOTE — Patient Instructions (Signed)
[facility] Discharge Instructions for Patients Receiving Chemotherapy  Today you received the following chemotherapy agents Infimzi  To help prevent nausea and vomiting after your treatment, we encourage you to take your nausea medication as directed   If you develop nausea and vomiting that is not controlled by your nausea medication, call the clinic.   BELOW ARE SYMPTOMS THAT SHOULD BE REPORTED IMMEDIATELY:  *FEVER GREATER THAN 100.5 F  *CHILLS WITH OR WITHOUT FEVER  NAUSEA AND VOMITING THAT IS NOT CONTROLLED WITH YOUR NAUSEA MEDICATION  *UNUSUAL SHORTNESS OF BREATH  *UNUSUAL BRUISING OR BLEEDING  TENDERNESS IN MOUTH AND THROAT WITH OR WITHOUT PRESENCE OF ULCERS  *URINARY PROBLEMS  *BOWEL PROBLEMS  UNUSUAL RASH Items with * indicate a potential emergency and should be followed up as soon as possible.  Feel free to call the clinic should you have any questions or concerns. The clinic phone number is [phone].  Please show the [facility] at check-in to the Emergency Department and triage nurse.

## 2020-09-05 ENCOUNTER — Other Ambulatory Visit: Payer: Self-pay

## 2020-09-05 ENCOUNTER — Encounter: Payer: Self-pay | Admitting: Internal Medicine

## 2020-09-05 ENCOUNTER — Inpatient Hospital Stay: Payer: Federal, State, Local not specified - PPO

## 2020-09-05 ENCOUNTER — Inpatient Hospital Stay (HOSPITAL_BASED_OUTPATIENT_CLINIC_OR_DEPARTMENT_OTHER): Payer: Federal, State, Local not specified - PPO | Admitting: Internal Medicine

## 2020-09-05 VITALS — BP 123/72 | HR 74 | Temp 96.3°F | Resp 19 | Ht 62.0 in | Wt 137.2 lb

## 2020-09-05 DIAGNOSIS — C3411 Malignant neoplasm of upper lobe, right bronchus or lung: Secondary | ICD-10-CM | POA: Diagnosis not present

## 2020-09-05 DIAGNOSIS — R937 Abnormal findings on diagnostic imaging of other parts of musculoskeletal system: Secondary | ICD-10-CM

## 2020-09-05 DIAGNOSIS — Z5112 Encounter for antineoplastic immunotherapy: Secondary | ICD-10-CM

## 2020-09-05 DIAGNOSIS — Z1211 Encounter for screening for malignant neoplasm of colon: Secondary | ICD-10-CM

## 2020-09-05 LAB — COMPREHENSIVE METABOLIC PANEL
ALT: 14 U/L (ref 0–44)
AST: 21 U/L (ref 15–41)
Albumin: 3.7 g/dL (ref 3.5–5.0)
Alkaline Phosphatase: 67 U/L (ref 38–126)
Anion gap: 9 (ref 5–15)
BUN: 21 mg/dL (ref 8–23)
CO2: 22 mmol/L (ref 22–32)
Calcium: 9.5 mg/dL (ref 8.9–10.3)
Chloride: 107 mmol/L (ref 98–111)
Creatinine, Ser: 0.71 mg/dL (ref 0.44–1.00)
GFR, Estimated: >60 mL/min (ref >60)
Glucose, Bld: 106 mg/dL — ABNORMAL HIGH (ref 70–99)
Potassium: 4 mmol/L (ref 3.5–5.1)
Sodium: 138 mmol/L (ref 135–145)
Total Bilirubin: 0.5 mg/dL (ref 0.3–1.2)
Total Protein: 7 g/dL (ref 6.5–8.1)

## 2020-09-05 LAB — CBC WITH DIFFERENTIAL/PLATELET
Abs Immature Granulocytes: 0.01 10*3/uL (ref 0.00–0.07)
Basophils Absolute: 0 10*3/uL (ref 0.0–0.1)
Basophils Relative: 1 %
Eosinophils Absolute: 0.2 10*3/uL (ref 0.0–0.5)
Eosinophils Relative: 3 %
HCT: 32.9 % — ABNORMAL LOW (ref 36.0–46.0)
Hemoglobin: 11.1 g/dL — ABNORMAL LOW (ref 12.0–15.0)
Immature Granulocytes: 0 %
Lymphocytes Relative: 21 %
Lymphs Abs: 1.5 10*3/uL (ref 0.7–4.0)
MCH: 32.3 pg (ref 26.0–34.0)
MCHC: 33.7 g/dL (ref 30.0–36.0)
MCV: 95.6 fL (ref 80.0–100.0)
Monocytes Absolute: 0.7 10*3/uL (ref 0.1–1.0)
Monocytes Relative: 10 %
Neutro Abs: 4.5 10*3/uL (ref 1.7–7.7)
Neutrophils Relative %: 65 %
Platelets: 220 10*3/uL (ref 150–400)
RBC: 3.44 MIL/uL — ABNORMAL LOW (ref 3.87–5.11)
RDW: 13.5 % (ref 11.5–15.5)
WBC: 7 10*3/uL (ref 4.0–10.5)
nRBC: 0 % (ref 0.0–0.2)

## 2020-09-05 LAB — TSH: TSH: 2.411 u[IU]/mL (ref 0.308–3.960)

## 2020-09-05 MED ORDER — SODIUM CHLORIDE 0.9% FLUSH
10.0000 mL | INTRAVENOUS | Status: DC | PRN
  Administered 2020-09-05: 10 mL
  Filled 2020-09-05: qty 10

## 2020-09-05 MED ORDER — SODIUM CHLORIDE 0.9 % IV SOLN
Freq: Once | INTRAVENOUS | Status: AC
  Filled 2020-09-05: qty 250

## 2020-09-05 MED ORDER — SODIUM CHLORIDE 0.9 % IV SOLN
1500.0000 mg | Freq: Once | INTRAVENOUS | Status: AC
  Administered 2020-09-05: 1500 mg via INTRAVENOUS
  Filled 2020-09-05: qty 30

## 2020-09-05 MED ORDER — HEPARIN SOD (PORK) LOCK FLUSH 100 UNIT/ML IV SOLN
500.0000 [IU] | Freq: Once | INTRAVENOUS | Status: AC | PRN
  Administered 2020-09-05: 500 [IU]
  Filled 2020-09-05: qty 5

## 2020-09-05 NOTE — Patient Instructions (Signed)

## 2020-09-05 NOTE — Progress Notes (Signed)
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  PRIOR THERAPY: None  CURRENT THERAPY: Palliative systemic chemotherapy with carboplatin for AUC of 5 on day 1, etoposide 100 mg/M2 on days 1, 2 and 3 as well as Imfinzi 1500 mg every 3 weeks with the chemotherapy and Neulasta support. First dose on 03/16/2019. Status post 20 cycles.   Starting from cycle #5 the patient will be treated with maintenance treatment with Imfinzi 1500 mg IV every 4 weeks.  INTERVAL HISTORY: Brittany Marks 79 y.o. female returns to the clinic today for follow-up visit.  The patient is feeling fine today with no concerning complaints.  She denied having any chest pain, shortness of breath, cough or hemoptysis.  She denied having any fever or chills.  She has a fall once recently and she does not know if she tripped on something or not.  She has no other neurological deficit.  The patient has no recent weight loss or night sweats.  She has no nausea, vomiting, diarrhea or constipation.  She denied having any headache or visual changes.  She is here today for evaluation before starting cycle #21.   MEDICAL HISTORY: Past Medical History:  Diagnosis Date  . Cataract    Bilateral  . Constipation    pain medication  . Left rotator cuff tear   . Right rotator cuff tear   . SCL CA dx'd 11/2018    ALLERGIES:  has No Known Allergies.  MEDICATIONS:  Current Outpatient Medications  Medication Sig Dispense Refill  . Ascorbic Acid (VITAMIN C PO) Take by mouth daily.     Marland Kitchen b complex vitamins tablet Take 1 tablet by mouth daily.     . COD LIVER OIL PO Take 1 tablet by mouth daily.     . cyclobenzaprine (FLEXERIL) 10 MG tablet Take 1 tablet (10 mg total) by mouth 3 (three) times daily as needed for muscle spasms. 30 tablet 1  . HYDROcodone-acetaminophen (NORCO) 5-325 MG tablet Take 1 tablet by mouth every 6 (six) hours as needed for moderate pain. 20 tablet 0  . hydrocortisone 2.5 % cream Apply topically as needed. (Patient taking differently: Apply 1 application topically daily as needed (Treatment). ) 3.5 g 0  . ibuprofen (ADVIL) 200 MG tablet Take 600 mg by mouth daily.    Marland Kitchen lidocaine-prilocaine (EMLA) cream Apply 1 application topically every 30 (thirty) days.     . Multiple Vitamin (MULTIVITAMIN) tablet Take 1 tablet by mouth daily.    . ondansetron (ZOFRAN) 4 MG tablet Take 1 tablet (4 mg total) by mouth every 8 (eight) hours as needed for nausea or vomiting. 10 tablet 0  . oxymetazoline (AFRIN) 0.05 % nasal spray Place 2 sprays into the nose 2 (two) times daily as needed (Bloody nose).     Marland Kitchen prochlorperazine (COMPAZINE) 10 MG tablet Take 1 tablet (10 mg total) by mouth every 6 (six) hours as needed for nausea or vomiting. 30 tablet 0  . silver sulfADIAZINE (SILVADENE) 1 % cream Apply 1 application topically 2 (two) times daily. 50 g 1  . sodium chloride (OCEAN) 0.65 % nasal spray Place 1 spray  into the nose at bedtime.     Marland Kitchen VITAMIN E PO Take 1 tablet by mouth daily.      No current facility-administered medications for this visit.   Facility-Administered Medications Ordered in Other Visits  Medication Dose Route Frequency Provider Last Rate Last Admin  . heparin lock flush 100 unit/mL  500 Units Intracatheter Once PRN Magrinat, Virgie Dad, MD      . sodium chloride flush (NS) 0.9 % injection 10 mL  10 mL Intracatheter PRN Magrinat, Virgie Dad, MD        SURGICAL HISTORY:  Past Surgical History:  Procedure Laterality Date  . CATARACT EXTRACTION W/ INTRAOCULAR LENS  IMPLANT, BILATERAL  2012  . DECOMPRESSIVE LUMBAR  LAMINECTOMY LEVEL 2 N/A 07/29/2013   Procedure: LUMBAR LAMINECTOMY, DECOMPRESSION LUMBAR THREE TO FOUR, FOUR TO FIVE microdiscectomy l3,4 right;  Surgeon: Tobi Bastos, MD;  Location: [facility];  Service: Orthopedics;  Laterality: N/A;  . I & D SUPERIOR RIGHT SHOULDER AND CLOSURE WOUND  01-10-2011   S/P ROTATOR CUFF REPAIR  . IR IMAGING GUIDED PORT INSERTION  04/09/2019  . LUMBAR LAMINECTOMY  1970'S  . LUMBAR LAMINECTOMY/DECOMPRESSION MICRODISCECTOMY N/A 06/27/2016   Procedure: L1 - L2 DISCECTOMY;  Surgeon: Melina Schools, MD;  Location: [place];  Service: Orthopedics;  Laterality: N/A;  . [place]  . REVERSE SHOULDER ARTHROPLASTY Left 03/10/2020   Procedure: REVERSE SHOULDER ARTHROPLASTY;  Surgeon: Justice Britain, MD;  Location: [facility];  Service: Orthopedics;  Laterality: Left;  122min  . RIGHT SHOULDER ARTHROSCOPY/ OPEN DISTAL CLAVICLE RESECTION/ SAD/ OPEN ROTATOR CUFF REPAIR  11-28-2010  . SHOULDER FUSION SURGERY  03/11/2020   Shoulder Surgery , hardware placed  . SHOULDER OPEN ROTATOR CUFF REPAIR  12/19/2011   Procedure: ROTATOR CUFF REPAIR SHOULDER OPEN;  Surgeon: Magnus Sinning, MD;  Location: [place];  Service: Orthopedics;  Laterality: Right;  RIGHT RECURRENT OPEN REPAIR OF THE ROTATOR CUFF WITH TISSUE MEND GRAFTANTERIOR CHROMIOECTOMY  . VAGINAL HYSTERECTOMY  1979    REVIEW OF SYSTEMS:  A comprehensive review of systems was negative except for: Constitutional: positive for fatigue Musculoskeletal: positive for muscle weakness   PHYSICAL EXAMINATION: General appearance: alert, cooperative, fatigued and no distress Head: Normocephalic, without obvious abnormality, atraumatic Neck: no adenopathy, no JVD, supple, symmetrical, trachea midline and thyroid not enlarged, symmetric, no tenderness/mass/nodules Lymph nodes: Cervical, supraclavicular, and axillary nodes normal. Resp: clear to auscultation bilaterally Back: symmetric, no curvature. ROM normal. No  CVA tenderness. Cardio: regular rate and rhythm, S1, S2 normal, no murmur, click, rub or gallop GI: soft, non-tender; bowel sounds normal; no masses,  no organomegaly Extremities: extremities normal, atraumatic, no cyanosis or edema   ECOG PERFORMANCE STATUS: 1 - Symptomatic but completely ambulatory  Blood pressure 123/72, pulse 74, temperature (!) 96.3 F (35.7 C), temperature source Tympanic, resp. rate 19, height 5\' 2"  (1.575 m), weight 137 lb 3.2 oz (62.2 kg), SpO2 97 %.  LABORATORY DATA: Lab Results  Component Value Date   WBC 7.2 08/08/2020   HGB 11.0 (L) 08/08/2020   HCT 32.0 (L) 08/08/2020   MCV 93.0 08/08/2020   PLT 229 08/08/2020      Chemistry      Component Value Date/Time   NA 136 08/08/2020 1012   K 3.9 08/08/2020 1012   CL 104 08/08/2020 1012   CO2 25 08/08/2020 1012   BUN 24 (H) 08/08/2020 1012   CREATININE 0.69 08/08/2020 1012   CREATININE 0.76 12/21/2019 1100  Component Value Date/Time   CALCIUM 8.7 (L) 08/08/2020 1012   ALKPHOS 73 08/08/2020 1012   AST 19 08/08/2020 1012   AST 22 12/21/2019 1100   ALT 13 08/08/2020 1012   ALT 13 12/21/2019 1100   BILITOT 0.3 08/08/2020 1012   BILITOT 0.3 12/21/2019 1100       RADIOGRAPHIC STUDIES: No results found.  ASSESSMENT AND PLAN: This is a very pleasant 79 years old white female with extensive stage small cell lung cancer and she is currently undergoing treatment with carboplatin, etoposide and Imfinzi status post 5 cycles.  Starting from cycle #6 the patient is on maintenance treatment with single agent Imfinzi every 4 weeks status post 20 cycles of total treatment. The patient continues to do well with no concerning complaints. I recommended for her to proceed with cycle #21 today as planned. Regarding the falls, I would consider repeating MRI of the brain if she has any more falls or other neurological abnormalities. The patient will come back for follow-up visit in 4 weeks for evaluation before the  next cycle of her treatment. She was advised to call immediately if she has any concerning symptoms in the interval. The patient voices understanding of current disease status and treatment options and is in agreement with the current care plan.  All questions were answered. The patient knows to call the clinic with any problems, questions or concerns. We can certainly see the patient much sooner if necessary.  Disclaimer: This note was dictated with voice recognition software. Similar sounding words can inadvertently be transcribed and may not be corrected upon review.

## 2020-09-05 NOTE — Patient Instructions (Signed)
[facility] Discharge Instructions for Patients Receiving Chemotherapy  Today you received the following chemotherapy agents Infimzi  To help prevent nausea and vomiting after your treatment, we encourage you to take your nausea medication as directed   If you develop nausea and vomiting that is not controlled by your nausea medication, call the clinic.   BELOW ARE SYMPTOMS THAT SHOULD BE REPORTED IMMEDIATELY:  *FEVER GREATER THAN 100.5 F  *CHILLS WITH OR WITHOUT FEVER  NAUSEA AND VOMITING THAT IS NOT CONTROLLED WITH YOUR NAUSEA MEDICATION  *UNUSUAL SHORTNESS OF BREATH  *UNUSUAL BRUISING OR BLEEDING  TENDERNESS IN MOUTH AND THROAT WITH OR WITHOUT PRESENCE OF ULCERS  *URINARY PROBLEMS  *BOWEL PROBLEMS  UNUSUAL RASH Items with * indicate a potential emergency and should be followed up as soon as possible.  Feel free to call the clinic should you have any questions or concerns. The clinic phone number is [phone].  Please show the [facility] at check-in to the Emergency Department and triage nurse.

## 2020-09-05 NOTE — Progress Notes (Signed)
Pt stable at discharge.  Wheelchair to lobby with volunteer.

## 2020-10-03 ENCOUNTER — Other Ambulatory Visit: Payer: Self-pay

## 2020-10-03 ENCOUNTER — Inpatient Hospital Stay: Payer: Federal, State, Local not specified - PPO

## 2020-10-03 ENCOUNTER — Telehealth: Payer: Self-pay | Admitting: Internal Medicine

## 2020-10-03 ENCOUNTER — Inpatient Hospital Stay: Payer: Federal, State, Local not specified - PPO | Attending: Oncology

## 2020-10-03 ENCOUNTER — Encounter: Payer: Self-pay | Admitting: Internal Medicine

## 2020-10-03 ENCOUNTER — Inpatient Hospital Stay (HOSPITAL_BASED_OUTPATIENT_CLINIC_OR_DEPARTMENT_OTHER): Payer: Federal, State, Local not specified - PPO | Admitting: Internal Medicine

## 2020-10-03 VITALS — BP 177/74 | HR 76 | Temp 97.1°F | Resp 20 | Ht 62.0 in | Wt 139.7 lb

## 2020-10-03 DIAGNOSIS — C7952 Secondary malignant neoplasm of bone marrow: Secondary | ICD-10-CM

## 2020-10-03 DIAGNOSIS — K5900 Constipation, unspecified: Secondary | ICD-10-CM | POA: Diagnosis not present

## 2020-10-03 DIAGNOSIS — R000 Tachycardia, unspecified: Secondary | ICD-10-CM | POA: Diagnosis not present

## 2020-10-03 DIAGNOSIS — Z5112 Encounter for antineoplastic immunotherapy: Secondary | ICD-10-CM

## 2020-10-03 DIAGNOSIS — C3411 Malignant neoplasm of upper lobe, right bronchus or lung: Secondary | ICD-10-CM

## 2020-10-03 DIAGNOSIS — Z79899 Other long term (current) drug therapy: Secondary | ICD-10-CM | POA: Insufficient documentation

## 2020-10-03 DIAGNOSIS — C7951 Secondary malignant neoplasm of bone: Secondary | ICD-10-CM

## 2020-10-03 DIAGNOSIS — Z95828 Presence of other vascular implants and grafts: Secondary | ICD-10-CM

## 2020-10-03 DIAGNOSIS — Z1211 Encounter for screening for malignant neoplasm of colon: Secondary | ICD-10-CM

## 2020-10-03 DIAGNOSIS — R937 Abnormal findings on diagnostic imaging of other parts of musculoskeletal system: Secondary | ICD-10-CM

## 2020-10-03 LAB — CBC WITH DIFFERENTIAL/PLATELET
Abs Immature Granulocytes: 0.02 10*3/uL (ref 0.00–0.07)
Basophils Absolute: 0 10*3/uL (ref 0.0–0.1)
Basophils Relative: 0 %
Eosinophils Absolute: 0.2 10*3/uL (ref 0.0–0.5)
Eosinophils Relative: 2 %
HCT: 34.7 % — ABNORMAL LOW (ref 36.0–46.0)
Hemoglobin: 11.4 g/dL — ABNORMAL LOW (ref 12.0–15.0)
Immature Granulocytes: 0 %
Lymphocytes Relative: 16 %
Lymphs Abs: 1.2 10*3/uL (ref 0.7–4.0)
MCH: 31.9 pg (ref 26.0–34.0)
MCHC: 32.9 g/dL (ref 30.0–36.0)
MCV: 97.2 fL (ref 80.0–100.0)
Monocytes Absolute: 0.7 10*3/uL (ref 0.1–1.0)
Monocytes Relative: 9 %
Neutro Abs: 5.7 10*3/uL (ref 1.7–7.7)
Neutrophils Relative %: 73 %
Platelets: 224 10*3/uL (ref 150–400)
RBC: 3.57 MIL/uL — ABNORMAL LOW (ref 3.87–5.11)
RDW: 13.2 % (ref 11.5–15.5)
WBC: 7.8 10*3/uL (ref 4.0–10.5)
nRBC: 0 % (ref 0.0–0.2)

## 2020-10-03 LAB — COMPREHENSIVE METABOLIC PANEL
ALT: 19 U/L (ref 0–44)
AST: 22 U/L (ref 15–41)
Albumin: 3.6 g/dL (ref 3.5–5.0)
Alkaline Phosphatase: 68 U/L (ref 38–126)
Anion gap: 5 (ref 5–15)
BUN: 22 mg/dL (ref 8–23)
CO2: 27 mmol/L (ref 22–32)
Calcium: 9.1 mg/dL (ref 8.9–10.3)
Chloride: 106 mmol/L (ref 98–111)
Creatinine, Ser: 0.73 mg/dL (ref 0.44–1.00)
GFR, Estimated: >60 mL/min (ref >60)
Glucose, Bld: 110 mg/dL — ABNORMAL HIGH (ref 70–99)
Potassium: 4 mmol/L (ref 3.5–5.1)
Sodium: 138 mmol/L (ref 135–145)
Total Bilirubin: 0.4 mg/dL (ref 0.3–1.2)
Total Protein: 7 g/dL (ref 6.5–8.1)

## 2020-10-03 LAB — TSH: TSH: 1.898 u[IU]/mL (ref 0.308–3.960)

## 2020-10-03 MED ORDER — HEPARIN SOD (PORK) LOCK FLUSH 100 UNIT/ML IV SOLN
500.0000 [IU] | Freq: Once | INTRAVENOUS | Status: AC | PRN
  Administered 2020-10-03: 15:00:00 500 [IU]
  Filled 2020-10-03: qty 5

## 2020-10-03 MED ORDER — SODIUM CHLORIDE 0.9% FLUSH
10.0000 mL | INTRAVENOUS | Status: DC | PRN
  Administered 2020-10-03: 15:00:00 10 mL
  Filled 2020-10-03: qty 10

## 2020-10-03 MED ORDER — SODIUM CHLORIDE 0.9% FLUSH
10.0000 mL | INTRAVENOUS | Status: DC | PRN
  Administered 2020-10-03: 12:00:00 10 mL
  Filled 2020-10-03: qty 10

## 2020-10-03 MED ORDER — SODIUM CHLORIDE 0.9 % IV SOLN
Freq: Once | INTRAVENOUS | Status: AC
  Filled 2020-10-03: qty 250

## 2020-10-03 MED ORDER — SODIUM CHLORIDE 0.9 % IV SOLN
1500.0000 mg | Freq: Once | INTRAVENOUS | Status: AC
  Administered 2020-10-03: 14:00:00 1500 mg via INTRAVENOUS
  Filled 2020-10-03: qty 30

## 2020-10-03 NOTE — Patient Instructions (Signed)
[facility] Discharge Instructions for Patients Receiving Chemotherapy  Today you received the following chemotherapy agents: durvalumab.  To help prevent nausea and vomiting after your treatment, we encourage you to take your nausea medication as directed.   If you develop nausea and vomiting that is not controlled by your nausea medication, call the clinic.   BELOW ARE SYMPTOMS THAT SHOULD BE REPORTED IMMEDIATELY:  *FEVER GREATER THAN 100.5 F  *CHILLS WITH OR WITHOUT FEVER  NAUSEA AND VOMITING THAT IS NOT CONTROLLED WITH YOUR NAUSEA MEDICATION  *UNUSUAL SHORTNESS OF BREATH  *UNUSUAL BRUISING OR BLEEDING  TENDERNESS IN MOUTH AND THROAT WITH OR WITHOUT PRESENCE OF ULCERS  *URINARY PROBLEMS  *BOWEL PROBLEMS  UNUSUAL RASH Items with * indicate a potential emergency and should be followed up as soon as possible.  Feel free to call the clinic should you have any questions or concerns. The clinic phone number is [phone].  Please show the CHEMO ALERT CARD at check-in to the Emergency Department and triage nurse.   

## 2020-10-03 NOTE — Progress Notes (Signed)
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  PRIOR THERAPY: None  CURRENT THERAPY: Palliative systemic chemotherapy with carboplatin for AUC of 5 on day 1, etoposide 100 mg/M2 on days 1, 2 and 3 as well as Imfinzi 1500 mg every 3 weeks with the chemotherapy and Neulasta support. First dose on 03/16/2019. Status post 21 cycles.   Starting from cycle #5 the patient will be treated with maintenance treatment with Imfinzi 1500 mg IV every 4 weeks.  INTERVAL HISTORY: Brittany Marks 79 y.o. female returns to the clinic today for follow-up visit.  The patient is feeling fine today with no concerning complaints except for the baseline shortness of breath.  The patient denied having any chest pain, cough or hemoptysis.  She denied having any fever or chills.  She has no nausea, vomiting, diarrhea or constipation.  She denied having any headache or visual changes.  She continues to tolerate her treatment with Imfinzi fairly well.   MEDICAL HISTORY: Past Medical History:  Diagnosis Date  . Cataract    Bilateral  . Constipation    pain medication  . Left rotator cuff tear   . Right rotator cuff tear   . SCL CA dx'd 11/2018    ALLERGIES:  has No Known Allergies.  MEDICATIONS:  Current Outpatient Medications  Medication Sig Dispense Refill  . Ascorbic Acid (VITAMIN C PO) Take by mouth daily.     Marland Kitchen b complex vitamins tablet Take 1 tablet by mouth daily.    . COD LIVER OIL PO Take 1 tablet by mouth daily.     . cyclobenzaprine (FLEXERIL) 10 MG tablet Take 1 tablet (10 mg total) by mouth 3 (three)  times daily as needed for muscle spasms. 30 tablet 1  . HYDROcodone-acetaminophen (NORCO) 5-325 MG tablet Take 1 tablet by mouth every 6 (six) hours as needed for moderate pain. 20 tablet 0  . hydrocortisone 2.5 % cream Apply topically as needed. (Patient taking differently: Apply 1 application topically daily as needed (Treatment). ) 3.5 g 0  . ibuprofen (ADVIL) 200 MG tablet Take 600 mg by mouth daily.    Marland Kitchen lidocaine-prilocaine (EMLA) cream Apply 1 application topically every 30 (thirty) days.     . Multiple Vitamin (MULTIVITAMIN) tablet Take 1 tablet by mouth daily.    . ondansetron (ZOFRAN) 4 MG tablet Take 1 tablet (4 mg total) by mouth every 8 (eight) hours as needed for nausea or vomiting. 10 tablet 0  . oxymetazoline (AFRIN) 0.05 % nasal spray Place 2 sprays into the nose 2 (two) times daily as needed (Bloody nose).     Marland Kitchen prochlorperazine (COMPAZINE) 10 MG tablet Take 1 tablet (10 mg total) by mouth every 6 (six) hours as needed for nausea or vomiting. 30 tablet 0  . silver sulfADIAZINE (SILVADENE) 1 % cream Apply 1 application topically 2 (two) times daily. 50 g 1  . sodium chloride (OCEAN) 0.65 % nasal spray Place 1 spray into the nose at bedtime.     Marland Kitchen VITAMIN E PO Take 1 tablet by mouth daily.      No current facility-administered medications for this visit.  Facility-Administered Medications Ordered in Other Visits  Medication Dose Route Frequency Provider Last Rate Last Admin  . heparin lock flush 100 unit/mL  500 Units Intracatheter Once PRN Magrinat, Virgie Dad, MD      . sodium chloride flush (NS) 0.9 % injection 10 mL  10 mL Intracatheter PRN Magrinat, Virgie Dad, MD        SURGICAL HISTORY:  Past Surgical History:  Procedure Laterality Date  . CATARACT EXTRACTION W/ INTRAOCULAR LENS  IMPLANT, BILATERAL  2012  . DECOMPRESSIVE LUMBAR LAMINECTOMY LEVEL 2 N/A 07/29/2013   Procedure: LUMBAR LAMINECTOMY, DECOMPRESSION LUMBAR THREE TO FOUR, FOUR TO FIVE microdiscectomy l3,4 right;   Surgeon: Tobi Bastos, MD;  Location: [facility];  Service: Orthopedics;  Laterality: N/A;  . I & D SUPERIOR RIGHT SHOULDER AND CLOSURE WOUND  01-10-2011   S/P ROTATOR CUFF REPAIR  . IR IMAGING GUIDED PORT INSERTION  04/09/2019  . LUMBAR LAMINECTOMY  1970'S  . LUMBAR LAMINECTOMY/DECOMPRESSION MICRODISCECTOMY N/A 06/27/2016   Procedure: L1 - L2 DISCECTOMY;  Surgeon: Melina Schools, MD;  Location: [place];  Service: Orthopedics;  Laterality: N/A;  . [place]  . REVERSE SHOULDER ARTHROPLASTY Left 03/10/2020   Procedure: REVERSE SHOULDER ARTHROPLASTY;  Surgeon: Justice Britain, MD;  Location: [facility];  Service: Orthopedics;  Laterality: Left;  1108min  . RIGHT SHOULDER ARTHROSCOPY/ OPEN DISTAL CLAVICLE RESECTION/ SAD/ OPEN ROTATOR CUFF REPAIR  11-28-2010  . SHOULDER FUSION SURGERY  03/11/2020   Shoulder Surgery , hardware placed  . SHOULDER OPEN ROTATOR CUFF REPAIR  12/19/2011   Procedure: ROTATOR CUFF REPAIR SHOULDER OPEN;  Surgeon: Magnus Sinning, MD;  Location: [place];  Service: Orthopedics;  Laterality: Right;  RIGHT RECURRENT OPEN REPAIR OF THE ROTATOR CUFF WITH TISSUE MEND GRAFTANTERIOR CHROMIOECTOMY  . VAGINAL HYSTERECTOMY  1979    REVIEW OF SYSTEMS:  A comprehensive review of systems was negative except for: Constitutional: positive for fatigue Musculoskeletal: positive for muscle weakness   PHYSICAL EXAMINATION: General appearance: alert, cooperative, fatigued and no distress Head: Normocephalic, without obvious abnormality, atraumatic Neck: no adenopathy, no JVD, supple, symmetrical, trachea midline and thyroid not enlarged, symmetric, no tenderness/mass/nodules Lymph nodes: Cervical, supraclavicular, and axillary nodes normal. Resp: clear to auscultation bilaterally Back: symmetric, no curvature. ROM normal. No CVA tenderness. Cardio: regular rate and rhythm, S1, S2 normal, no murmur, click, rub or gallop GI: soft, non-tender; bowel sounds normal; no  masses,  no organomegaly Extremities: extremities normal, atraumatic, no cyanosis or edema   ECOG PERFORMANCE STATUS: 1 - Symptomatic but completely ambulatory  Blood pressure (!) 177/74, pulse 76, temperature (!) 97.1 F (36.2 C), temperature source Tympanic, resp. rate 20, height 5\' 2"  (1.575 m), weight 139 lb 11.2 oz (63.4 kg), SpO2 98 %.  LABORATORY DATA: Lab Results  Component Value Date   WBC 7.8 10/03/2020   HGB 11.4 (L) 10/03/2020   HCT 34.7 (L) 10/03/2020   MCV 97.2 10/03/2020   PLT 224 10/03/2020      Chemistry      Component Value Date/Time   NA 138 10/03/2020 1208   K 4.0 10/03/2020 1208   CL 106 10/03/2020 1208   CO2 27 10/03/2020 1208   BUN 22 10/03/2020 1208   CREATININE 0.73 10/03/2020 1208   CREATININE 0.76 12/21/2019 1100      Component Value Date/Time   CALCIUM 9.1 10/03/2020 1208   ALKPHOS 68 10/03/2020 1208   AST 22 10/03/2020 1208   AST 22 12/21/2019 1100   ALT  19 10/03/2020 1208   ALT 13 12/21/2019 1100   BILITOT 0.4 10/03/2020 1208   BILITOT 0.3 12/21/2019 1100       RADIOGRAPHIC STUDIES: No results found.  ASSESSMENT AND PLAN: This is a very pleasant 79 years old white female with extensive stage small cell lung cancer and she is currently undergoing treatment with carboplatin, etoposide and Imfinzi status post 5 cycles.  Starting from cycle #6 the patient is on maintenance treatment with single agent Imfinzi every 4 weeks status post 21 cycles of total treatment. The patient continues to tolerate her treatment fairly well with no concerning adverse effects. I recommended for her to proceed with cycle #22 today as planned. I will see her back for follow-up visit in 4 weeks for evaluation before the next cycle of her treatment. We'll consider restaging scan after the next cycle of her treatment. For the hypertension, she was advised to reach out to her primary care physician or cardiology for evaluation and recommendation regarding her  hypertension. For the tachycardia, this was evaluated in the past by her cardiologist and no intervention is needed. The patient was advised to call immediately if she has any other concerning symptoms in the interval. The patient voices understanding of current disease status and treatment options and is in agreement with the current care plan.  All questions were answered. The patient knows to call the clinic with any problems, questions or concerns. We can certainly see the patient much sooner if necessary.  Disclaimer: This note was dictated with voice recognition software. Similar sounding words can inadvertently be transcribed and may not be corrected upon review.

## 2020-10-03 NOTE — Patient Instructions (Signed)

## 2020-10-03 NOTE — Telephone Encounter (Signed)
2 cycles already scheduled per 12/27 los.

## 2020-10-31 ENCOUNTER — Inpatient Hospital Stay: Payer: Federal, State, Local not specified - PPO | Attending: Oncology | Admitting: Internal Medicine

## 2020-10-31 ENCOUNTER — Inpatient Hospital Stay: Payer: Federal, State, Local not specified - PPO

## 2020-10-31 ENCOUNTER — Other Ambulatory Visit: Payer: Self-pay

## 2020-10-31 VITALS — BP 154/83 | HR 76 | Temp 96.6°F | Resp 18 | Ht 62.0 in | Wt 141.3 lb

## 2020-10-31 DIAGNOSIS — Z5112 Encounter for antineoplastic immunotherapy: Secondary | ICD-10-CM | POA: Diagnosis not present

## 2020-10-31 DIAGNOSIS — Z1211 Encounter for screening for malignant neoplasm of colon: Secondary | ICD-10-CM

## 2020-10-31 DIAGNOSIS — R0602 Shortness of breath: Secondary | ICD-10-CM | POA: Diagnosis not present

## 2020-10-31 DIAGNOSIS — C7951 Secondary malignant neoplasm of bone: Secondary | ICD-10-CM | POA: Diagnosis not present

## 2020-10-31 DIAGNOSIS — C3490 Malignant neoplasm of unspecified part of unspecified bronchus or lung: Secondary | ICD-10-CM | POA: Diagnosis not present

## 2020-10-31 DIAGNOSIS — C3411 Malignant neoplasm of upper lobe, right bronchus or lung: Secondary | ICD-10-CM

## 2020-10-31 DIAGNOSIS — R937 Abnormal findings on diagnostic imaging of other parts of musculoskeletal system: Secondary | ICD-10-CM

## 2020-10-31 DIAGNOSIS — Z79899 Other long term (current) drug therapy: Secondary | ICD-10-CM | POA: Insufficient documentation

## 2020-10-31 DIAGNOSIS — K5900 Constipation, unspecified: Secondary | ICD-10-CM | POA: Insufficient documentation

## 2020-10-31 LAB — COMPREHENSIVE METABOLIC PANEL
ALT: 15 U/L (ref 0–44)
AST: 23 U/L (ref 15–41)
Albumin: 3.6 g/dL (ref 3.5–5.0)
Alkaline Phosphatase: 73 U/L (ref 38–126)
Anion gap: 6 (ref 5–15)
BUN: 16 mg/dL (ref 8–23)
CO2: 23 mmol/L (ref 22–32)
Calcium: 8.9 mg/dL (ref 8.9–10.3)
Chloride: 105 mmol/L (ref 98–111)
Creatinine, Ser: 0.73 mg/dL (ref 0.44–1.00)
GFR, Estimated: >60 mL/min (ref >60)
Glucose, Bld: 95 mg/dL (ref 70–99)
Potassium: 4 mmol/L (ref 3.5–5.1)
Sodium: 134 mmol/L — ABNORMAL LOW (ref 135–145)
Total Bilirubin: 0.4 mg/dL (ref 0.3–1.2)
Total Protein: 6.9 g/dL (ref 6.5–8.1)

## 2020-10-31 LAB — CBC WITH DIFFERENTIAL/PLATELET
Abs Immature Granulocytes: 0.02 10*3/uL (ref 0.00–0.07)
Basophils Absolute: 0 10*3/uL (ref 0.0–0.1)
Basophils Relative: 0 %
Eosinophils Absolute: 0.2 10*3/uL (ref 0.0–0.5)
Eosinophils Relative: 3 %
HCT: 34.6 % — ABNORMAL LOW (ref 36.0–46.0)
Hemoglobin: 11.8 g/dL — ABNORMAL LOW (ref 12.0–15.0)
Immature Granulocytes: 0 %
Lymphocytes Relative: 20 %
Lymphs Abs: 1.7 10*3/uL (ref 0.7–4.0)
MCH: 32.2 pg (ref 26.0–34.0)
MCHC: 34.1 g/dL (ref 30.0–36.0)
MCV: 94.5 fL (ref 80.0–100.0)
Monocytes Absolute: 0.8 10*3/uL (ref 0.1–1.0)
Monocytes Relative: 9 %
Neutro Abs: 5.6 10*3/uL (ref 1.7–7.7)
Neutrophils Relative %: 68 %
Platelets: 204 10*3/uL (ref 150–400)
RBC: 3.66 MIL/uL — ABNORMAL LOW (ref 3.87–5.11)
RDW: 12.4 % (ref 11.5–15.5)
WBC: 8.3 10*3/uL (ref 4.0–10.5)
nRBC: 0 % (ref 0.0–0.2)

## 2020-10-31 LAB — TSH: TSH: 2.621 u[IU]/mL (ref 0.308–3.960)

## 2020-10-31 MED ORDER — SODIUM CHLORIDE 0.9 % IV SOLN
1500.0000 mg | Freq: Once | INTRAVENOUS | Status: AC
  Administered 2020-10-31: 1500 mg via INTRAVENOUS
  Filled 2020-10-31: qty 30

## 2020-10-31 MED ORDER — SODIUM CHLORIDE 0.9% FLUSH
10.0000 mL | INTRAVENOUS | Status: DC | PRN
  Administered 2020-10-31: 10 mL
  Filled 2020-10-31: qty 10

## 2020-10-31 MED ORDER — SODIUM CHLORIDE 0.9 % IV SOLN
Freq: Once | INTRAVENOUS | Status: AC
  Filled 2020-10-31: qty 250

## 2020-10-31 MED ORDER — HEPARIN SOD (PORK) LOCK FLUSH 100 UNIT/ML IV SOLN
500.0000 [IU] | Freq: Once | INTRAVENOUS | Status: AC | PRN
  Administered 2020-10-31: 500 [IU]
  Filled 2020-10-31: qty 5

## 2020-10-31 NOTE — Patient Instructions (Signed)
[facility] for Patients Receiving [facility]otherapy  Today you received the following chemotherapy agents: Imfinzi.  To help prevent nausea and vomiting after your treatment, we encourage you to take your nausea medication as directed.   If you develop nausea and vomiting that is not controlled by your nausea medication, call the clinic.   BELOW ARE SYMPTOMS THAT SHOULD BE REPORTED IMMEDIATELY:  *FEVER GREATER THAN 100.5 F  *CHILLS WITH OR WITHOUT FEVER  NAUSEA AND VOMITING THAT IS NOT CONTROLLED WITH YOUR NAUSEA MEDICATION  *UNUSUAL SHORTNESS OF BREATH  *UNUSUAL BRUISING OR BLEEDING  TENDERNESS IN MOUTH AND THROAT WITH OR WITHOUT PRESENCE OF ULCERS  *URINARY PROBLEMS  *BOWEL PROBLEMS  UNUSUAL RASH Items with * indicate a potential emergency and should be followed up as soon as possible.  Feel free to call the clinic should you have any questions or concerns. The clinic phone number is [phone].  Please show the CHEMO ALERT CARD at check-in to the Emergency Department and triage nurse.   

## 2020-10-31 NOTE — Progress Notes (Signed)
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  PRIOR THERAPY: None  CURRENT THERAPY: Palliative systemic chemotherapy with carboplatin for AUC of 5 on day 1, etoposide 100 mg/M2 on days 1, 2 and 3 as well as Imfinzi 1500 mg every 3 weeks with the chemotherapy and Neulasta support. First dose on 03/16/2019. Status post 22 cycles.   Starting from cycle #5 the patient will be treated with maintenance treatment with Imfinzi 1500 mg IV every 4 weeks.  INTERVAL HISTORY: Brittany Marks 80 y.o. female returns to the clinic today for follow-up visit.  The patient is feeling fine today with no concerning complaints except for the baseline shortness of breath increased with exertion.  She denied having any chest pain, cough or hemoptysis.  She denied having any recent weight loss or night sweats.  She has no nausea, vomiting, diarrhea or constipation.  She denied having any headache or visual changes.  She continues to tolerate her treatment with Imfinzi fairly well.  The patient is here today for evaluation before starting cycle #23 of her treatment.  MEDICAL HISTORY: Past Medical History:  Diagnosis Date  . Cataract    Bilateral  . Constipation    pain medication  . Left rotator cuff tear   . Right rotator cuff tear   . SCL CA dx'd 11/2018    ALLERGIES:  has No Known Allergies.  MEDICATIONS:  Current Outpatient Medications  Medication Sig Dispense Refill  . Ascorbic Acid (VITAMIN C PO) Take by mouth daily.     Marland Kitchen b complex vitamins tablet Take 1 tablet by mouth daily.    . COD LIVER OIL PO Take 1  tablet by mouth daily.     . cyclobenzaprine (FLEXERIL) 10 MG tablet Take 1 tablet (10 mg total) by mouth 3 (three) times daily as needed for muscle spasms. 30 tablet 1  . HYDROcodone-acetaminophen (NORCO) 5-325 MG tablet Take 1 tablet by mouth every 6 (six) hours as needed for moderate pain. 20 tablet 0  . hydrocortisone 2.5 % cream Apply topically as needed. (Patient taking differently: Apply 1 application topically daily as needed (Treatment). ) 3.5 g 0  . ibuprofen (ADVIL) 200 MG tablet Take 600 mg by mouth daily.    Marland Kitchen lidocaine-prilocaine (EMLA) cream Apply 1 application topically every 30 (thirty) days.     . Multiple Vitamin (MULTIVITAMIN) tablet Take 1 tablet by mouth daily.    . ondansetron (ZOFRAN) 4 MG tablet Take 1 tablet (4 mg total) by mouth every 8 (eight) hours as needed for nausea or vomiting. 10 tablet 0  . oxymetazoline (AFRIN) 0.05 % nasal spray Place 2 sprays into the nose 2 (two) times daily as needed (Bloody nose).     Marland Kitchen prochlorperazine (COMPAZINE) 10 MG tablet Take 1 tablet (10 mg total) by mouth every 6 (six) hours as needed for nausea or vomiting. 30 tablet 0  . silver sulfADIAZINE (SILVADENE) 1 % cream Apply 1 application topically 2 (two) times daily. 50 g 1  . sodium chloride (OCEAN) 0.65 % nasal spray Place 1 spray into the nose at bedtime.     Marland Kitchen VITAMIN E PO  Take 1 tablet by mouth daily.      No current facility-administered medications for this visit.   Facility-Administered Medications Ordered in Other Visits  Medication Dose Route Frequency Provider Last Rate Last Admin  . heparin lock flush 100 unit/mL  500 Units Intracatheter Once PRN Magrinat, Virgie Dad, MD      . sodium chloride flush (NS) 0.9 % injection 10 mL  10 mL Intracatheter PRN Magrinat, Virgie Dad, MD        SURGICAL HISTORY:  Past Surgical History:  Procedure Laterality Date  . CATARACT EXTRACTION W/ INTRAOCULAR LENS  IMPLANT, BILATERAL  2012  . DECOMPRESSIVE LUMBAR LAMINECTOMY LEVEL 2 N/A  07/29/2013   Procedure: LUMBAR LAMINECTOMY, DECOMPRESSION LUMBAR THREE TO FOUR, FOUR TO FIVE microdiscectomy l3,4 right;  Surgeon: Tobi Bastos, MD;  Location: [facility];  Service: Orthopedics;  Laterality: N/A;  . I & D SUPERIOR RIGHT SHOULDER AND CLOSURE WOUND  01-10-2011   S/P ROTATOR CUFF REPAIR  . IR IMAGING GUIDED PORT INSERTION  04/09/2019  . LUMBAR LAMINECTOMY  1970'S  . LUMBAR LAMINECTOMY/DECOMPRESSION MICRODISCECTOMY N/A 06/27/2016   Procedure: L1 - L2 DISCECTOMY;  Surgeon: Melina Schools, MD;  Location: [facility];  Service: Orthopedics;  Laterality: N/A;  . [place]  . REVERSE SHOULDER ARTHROPLASTY Left 03/10/2020   Procedure: REVERSE SHOULDER ARTHROPLASTY;  Surgeon: Justice Britain, MD;  Location: [facility];  Service: Orthopedics;  Laterality: Left;  133min  . RIGHT SHOULDER ARTHROSCOPY/ OPEN DISTAL CLAVICLE RESECTION/ SAD/ OPEN ROTATOR CUFF REPAIR  11-28-2010  . SHOULDER FUSION SURGERY  03/11/2020   Shoulder Surgery , hardware placed  . SHOULDER OPEN ROTATOR CUFF REPAIR  12/19/2011   Procedure: ROTATOR CUFF REPAIR SHOULDER OPEN;  Surgeon: Magnus Sinning, MD;  Location: [facility];  Service: Orthopedics;  Laterality: Right;  RIGHT RECURRENT OPEN REPAIR OF THE ROTATOR CUFF WITH TISSUE MEND GRAFTANTERIOR CHROMIOECTOMY  . VAGINAL HYSTERECTOMY  1979    REVIEW OF SYSTEMS:  A comprehensive review of systems was negative except for: Constitutional: positive for fatigue Respiratory: positive for dyspnea on exertion Musculoskeletal: positive for muscle weakness   PHYSICAL EXAMINATION: General appearance: alert, cooperative, fatigued and no distress Head: Normocephalic, without obvious abnormality, atraumatic Neck: no adenopathy, no JVD, supple, symmetrical, trachea midline and thyroid not enlarged, symmetric, no tenderness/mass/nodules Lymph nodes: Cervical, supraclavicular, and axillary nodes normal. Resp: clear to auscultation bilaterally Back: symmetric, no  curvature. ROM normal. No CVA tenderness. Cardio: regular rate and rhythm, S1, S2 normal, no murmur, click, rub or gallop GI: soft, non-tender; bowel sounds normal; no masses,  no organomegaly Extremities: extremities normal, atraumatic, no cyanosis or edema   ECOG PERFORMANCE STATUS: 1 - Symptomatic but completely ambulatory  Blood pressure (!) 154/83, pulse 76, temperature (!) 96.6 F (35.9 C), temperature source Tympanic, resp. rate 18, height 5\' 2"  (1.575 m), weight 141 lb 4.8 oz (64.1 kg), SpO2 97 %.  LABORATORY DATA: Lab Results  Component Value Date   WBC 8.3 10/31/2020   HGB 11.8 (L) 10/31/2020   HCT 34.6 (L) 10/31/2020   MCV 94.5 10/31/2020   PLT 204 10/31/2020      Chemistry      Component Value Date/Time   NA 138 10/03/2020 1208   K 4.0 10/03/2020 1208   CL 106 10/03/2020 1208   CO2 27 10/03/2020 1208   BUN 22 10/03/2020 1208   CREATININE 0.73 10/03/2020 1208   CREATININE 0.76 12/21/2019 1100      Component Value Date/Time  CALCIUM 9.1 10/03/2020 1208   ALKPHOS 68 10/03/2020 1208   AST 22 10/03/2020 1208   AST 22 12/21/2019 1100   ALT 19 10/03/2020 1208   ALT 13 12/21/2019 1100   BILITOT 0.4 10/03/2020 1208   BILITOT 0.3 12/21/2019 1100       RADIOGRAPHIC STUDIES: No results found.  ASSESSMENT AND PLAN: This is a very pleasant 80 years old white female with extensive stage small cell lung cancer and she is currently undergoing treatment with carboplatin, etoposide and Imfinzi status post 5 cycles.  Starting from cycle #6 the patient is on maintenance treatment with single agent Imfinzi every 4 weeks status post 22 cycles of total treatment. She continues to tolerate her treatment with Imfinzi fairly well. I recommended for her to proceed with cycle #23 today as planned. I will see her back for follow-up visit in 4 weeks for evaluation with repeat CT scan of the chest, abdomen pelvis for restaging of her disease. The patient was advised to call  immediately if she has any concerning symptoms in the interval.  The patient voices understanding of current disease status and treatment options and is in agreement with the current care plan.  All questions were answered. The patient knows to call the clinic with any problems, questions or concerns. We can certainly see the patient much sooner if necessary.  Disclaimer: This note was dictated with voice recognition software. Similar sounding words can inadvertently be transcribed and may not be corrected upon review.

## 2020-11-02 ENCOUNTER — Telehealth: Payer: Self-pay | Admitting: Internal Medicine

## 2020-11-02 NOTE — Telephone Encounter (Signed)
Scheduled appointments per 1/24 los. Called patient, no answer. Left message with appointments dates and times.

## 2020-11-24 ENCOUNTER — Ambulatory Visit (HOSPITAL_COMMUNITY)
Admission: RE | Admit: 2020-11-24 | Discharge: 2020-11-24 | Disposition: A | Discharge status: Home / Self Care | Payer: Federal, State, Local not specified - PPO | Source: Ambulatory Visit | Attending: Internal Medicine | Admitting: Internal Medicine

## 2020-11-24 ENCOUNTER — Other Ambulatory Visit: Payer: Self-pay

## 2020-11-24 DIAGNOSIS — C3490 Malignant neoplasm of unspecified part of unspecified bronchus or lung: Secondary | ICD-10-CM | POA: Insufficient documentation

## 2020-11-24 MED ORDER — IOHEXOL 300 MG/ML  SOLN
100.0000 mL | Freq: Once | INTRAMUSCULAR | Status: AC | PRN
  Administered 2020-11-24: 100 mL via INTRAVENOUS

## 2020-11-24 MED ORDER — HEPARIN SOD (PORK) LOCK FLUSH 100 UNIT/ML IV SOLN: 500.0000 [IU] | Freq: Once | INTRAVENOUS | Status: AC

## 2020-11-24 MED ORDER — HEPARIN SOD (PORK) LOCK FLUSH 100 UNIT/ML IV SOLN
INTRAVENOUS | Status: AC
  Administered 2020-11-24: 500 [IU] via INTRAVENOUS
  Filled 2020-11-24: qty 5

## 2020-11-28 ENCOUNTER — Inpatient Hospital Stay: Payer: Federal, State, Local not specified - PPO

## 2020-11-28 ENCOUNTER — Ambulatory Visit: Payer: Federal, State, Local not specified - PPO | Admitting: Internal Medicine

## 2020-11-28 ENCOUNTER — Other Ambulatory Visit: Payer: Self-pay

## 2020-11-28 ENCOUNTER — Other Ambulatory Visit: Payer: Federal, State, Local not specified - PPO

## 2020-11-28 ENCOUNTER — Ambulatory Visit: Payer: Federal, State, Local not specified - PPO

## 2020-11-28 ENCOUNTER — Encounter: Payer: Self-pay | Admitting: Internal Medicine

## 2020-11-28 ENCOUNTER — Inpatient Hospital Stay (HOSPITAL_BASED_OUTPATIENT_CLINIC_OR_DEPARTMENT_OTHER): Payer: Federal, State, Local not specified - PPO | Admitting: Internal Medicine

## 2020-11-28 ENCOUNTER — Inpatient Hospital Stay: Payer: Federal, State, Local not specified - PPO | Attending: Oncology

## 2020-11-28 VITALS — BP 159/81 | HR 75 | Temp 97.3°F | Resp 15 | Ht 62.0 in | Wt 142.2 lb

## 2020-11-28 DIAGNOSIS — Z79899 Other long term (current) drug therapy: Secondary | ICD-10-CM | POA: Insufficient documentation

## 2020-11-28 DIAGNOSIS — J439 Emphysema, unspecified: Secondary | ICD-10-CM | POA: Insufficient documentation

## 2020-11-28 DIAGNOSIS — Z5112 Encounter for antineoplastic immunotherapy: Secondary | ICD-10-CM | POA: Diagnosis not present

## 2020-11-28 DIAGNOSIS — I700 Atherosclerosis of aorta: Secondary | ICD-10-CM | POA: Diagnosis not present

## 2020-11-28 DIAGNOSIS — K5900 Constipation, unspecified: Secondary | ICD-10-CM | POA: Insufficient documentation

## 2020-11-28 DIAGNOSIS — C3411 Malignant neoplasm of upper lobe, right bronchus or lung: Secondary | ICD-10-CM | POA: Insufficient documentation

## 2020-11-28 DIAGNOSIS — C7951 Secondary malignant neoplasm of bone: Secondary | ICD-10-CM | POA: Diagnosis not present

## 2020-11-28 DIAGNOSIS — R937 Abnormal findings on diagnostic imaging of other parts of musculoskeletal system: Secondary | ICD-10-CM

## 2020-11-28 DIAGNOSIS — Z5111 Encounter for antineoplastic chemotherapy: Secondary | ICD-10-CM | POA: Diagnosis not present

## 2020-11-28 DIAGNOSIS — C778 Secondary and unspecified malignant neoplasm of lymph nodes of multiple regions: Secondary | ICD-10-CM | POA: Diagnosis not present

## 2020-11-28 DIAGNOSIS — K449 Diaphragmatic hernia without obstruction or gangrene: Secondary | ICD-10-CM | POA: Insufficient documentation

## 2020-11-28 DIAGNOSIS — Z95828 Presence of other vascular implants and grafts: Secondary | ICD-10-CM

## 2020-11-28 DIAGNOSIS — Z1211 Encounter for screening for malignant neoplasm of colon: Secondary | ICD-10-CM

## 2020-11-28 LAB — CBC WITH DIFFERENTIAL/PLATELET
Abs Immature Granulocytes: 0.02 10*3/uL (ref 0.00–0.07)
Basophils Absolute: 0 10*3/uL (ref 0.0–0.1)
Basophils Relative: 1 %
Eosinophils Absolute: 0.3 10*3/uL (ref 0.0–0.5)
Eosinophils Relative: 4 %
HCT: 34.2 % — ABNORMAL LOW (ref 36.0–46.0)
Hemoglobin: 11.7 g/dL — ABNORMAL LOW (ref 12.0–15.0)
Immature Granulocytes: 0 %
Lymphocytes Relative: 19 %
Lymphs Abs: 1.6 10*3/uL (ref 0.7–4.0)
MCH: 32 pg (ref 26.0–34.0)
MCHC: 34.2 g/dL (ref 30.0–36.0)
MCV: 93.4 fL (ref 80.0–100.0)
Monocytes Absolute: 0.8 10*3/uL (ref 0.1–1.0)
Monocytes Relative: 10 %
Neutro Abs: 5.6 10*3/uL (ref 1.7–7.7)
Neutrophils Relative %: 66 %
Platelets: 240 10*3/uL (ref 150–400)
RBC: 3.66 MIL/uL — ABNORMAL LOW (ref 3.87–5.11)
RDW: 12.4 % (ref 11.5–15.5)
WBC: 8.3 10*3/uL (ref 4.0–10.5)
nRBC: 0 % (ref 0.0–0.2)

## 2020-11-28 LAB — TSH: TSH: 2.815 u[IU]/mL (ref 0.308–3.960)

## 2020-11-28 LAB — COMPREHENSIVE METABOLIC PANEL
ALT: 15 U/L (ref 0–44)
AST: 23 U/L (ref 15–41)
Albumin: 3.7 g/dL (ref 3.5–5.0)
Alkaline Phosphatase: 83 U/L (ref 38–126)
Anion gap: 8 (ref 5–15)
BUN: 16 mg/dL (ref 8–23)
CO2: 23 mmol/L (ref 22–32)
Calcium: 8.9 mg/dL (ref 8.9–10.3)
Chloride: 105 mmol/L (ref 98–111)
Creatinine, Ser: 0.72 mg/dL (ref 0.44–1.00)
GFR, Estimated: >60 mL/min (ref >60)
Glucose, Bld: 95 mg/dL (ref 70–99)
Potassium: 4.3 mmol/L (ref 3.5–5.1)
Sodium: 136 mmol/L (ref 135–145)
Total Bilirubin: 0.3 mg/dL (ref 0.3–1.2)
Total Protein: 7.1 g/dL (ref 6.5–8.1)

## 2020-11-28 MED ORDER — SODIUM CHLORIDE 0.9% FLUSH
10.0000 mL | INTRAVENOUS | Status: DC | PRN
  Administered 2020-11-28: 10 mL
  Filled 2020-11-28: qty 10

## 2020-11-28 MED ORDER — SODIUM CHLORIDE 0.9 % IV SOLN
Freq: Once | INTRAVENOUS | Status: AC
  Filled 2020-11-28: qty 250

## 2020-11-28 MED ORDER — HEPARIN SOD (PORK) LOCK FLUSH 100 UNIT/ML IV SOLN
500.0000 [IU] | Freq: Once | INTRAVENOUS | Status: AC | PRN
  Administered 2020-11-28: 500 [IU]
  Filled 2020-11-28: qty 5

## 2020-11-28 MED ORDER — SODIUM CHLORIDE 0.9 % IV SOLN
1500.0000 mg | Freq: Once | INTRAVENOUS | Status: AC
  Administered 2020-11-28: 1500 mg via INTRAVENOUS
  Filled 2020-11-28: qty 30

## 2020-11-28 NOTE — Patient Instructions (Signed)
Steps to Quit Smoking Smoking tobacco is the leading cause of preventable death. It can affect almost every organ in the body. Smoking puts you and people around you at risk for many serious, long-lasting (chronic) diseases. Quitting smoking can be hard, but it is one of the best things that you can do for your health. It is never too late to quit. How do I get ready to quit? When you decide to quit smoking, make a plan to help you succeed. Before you quit:  Pick a date to quit. Set a date within the next 2 weeks to give you time to prepare.  Write down the reasons why you are quitting. Keep this list in places where you will see it often.  Tell your family, friends, and co-workers that you are quitting. Their support is important.  Talk with your doctor about the choices that may help you quit.  Find out if your health insurance will pay for these treatments.  Know the people, places, things, and activities that make you want to smoke (triggers). Avoid them. What first steps can I take to quit smoking?  Throw away all cigarettes at home, at work, and in your car.  Throw away the things that you use when you smoke, such as ashtrays and lighters.  Clean your car. Make sure to empty the ashtray.  Clean your home, including curtains and carpets. What can I do to help me quit smoking? Talk with your doctor about taking medicines and seeing a counselor at the same time. You are more likely to succeed when you do both.  If you are pregnant or breastfeeding, talk with your doctor about counseling or other ways to quit smoking. Do not take medicine to help you quit smoking unless your doctor tells you to do so. To quit smoking: Quit right away  Quit smoking totally, instead of slowly cutting back on how much you smoke over a period of time.  Go to counseling. You are more likely to quit if you go to counseling sessions regularly. Take medicine You may take medicines to help you quit. Some  medicines need a prescription, and some you can buy over-the-counter. Some medicines may contain a drug called nicotine to replace the nicotine in cigarettes. Medicines may:  Help you to stop having the desire to smoke (cravings).  Help to stop the problems that come when you stop smoking (withdrawal symptoms). Your doctor may ask you to use:  Nicotine patches, gum, or lozenges.  Nicotine inhalers or sprays.  Non-nicotine medicine that is taken by mouth. Find resources Find resources and other ways to help you quit smoking and remain smoke-free after you quit. These resources are most helpful when you use them often. They include:  Online chats with a counselor.  Phone quitlines.  Printed self-help materials.  Support groups or group counseling.  Text messaging programs.  Mobile phone apps. Use apps on your mobile phone or tablet that can help you stick to your quit plan. There are many free apps for mobile phones and tablets as well as websites. Examples include Quit Guide from the CDC and smokefree.gov   What things can I do to make it easier to quit?  Talk to your family and friends. Ask them to support and encourage you.  Call a phone quitline (1-800-QUIT-NOW), reach out to support groups, or work with a counselor.  Ask people who smoke to not smoke around you.  Avoid places that make you want to smoke,   such as: ? Bars. ? Parties. ? Smoke-break areas at work.  Spend time with people who do not smoke.  Lower the stress in your life. Stress can make you want to smoke. Try these things to help your stress: ? Getting regular exercise. ? Doing deep-breathing exercises. ? Doing yoga. ? Meditating. ? Doing a body scan. To do this, close your eyes, focus on one area of your body at a time from head to toe. Notice which parts of your body are tense. Try to relax the muscles in those areas.   How will I feel when I quit smoking? Day 1 to 3 weeks Within the first 24 hours,  you may start to have some problems that come from quitting tobacco. These problems are very bad 2-3 days after you quit, but they do not often last for more than 2-3 weeks. You may get these symptoms:  Mood swings.  Feeling restless, nervous, angry, or annoyed.  Trouble concentrating.  Dizziness.  Strong desire for high-sugar foods and nicotine.  Weight gain.  Trouble pooping (constipation).  Feeling like you may vomit (nausea).  Coughing or a sore throat.  Changes in how the medicines that you take for other issues work in your body.  Depression.  Trouble sleeping (insomnia). Week 3 and afterward After the first 2-3 weeks of quitting, you may start to notice more positive results, such as:  Better sense of smell and taste.  Less coughing and sore throat.  Slower heart rate.  Lower blood pressure.  Clearer skin.  Better breathing.  Fewer sick days. Quitting smoking can be hard. Do not give up if you fail the first time. Some people need to try a few times before they succeed. Do your best to stick to your quit plan, and talk with your doctor if you have any questions or concerns. Summary  Smoking tobacco is the leading cause of preventable death. Quitting smoking can be hard, but it is one of the best things that you can do for your health.  When you decide to quit smoking, make a plan to help you succeed.  Quit smoking right away, not slowly over a period of time.  When you start quitting, seek help from your doctor, family, or friends. This information is not intended to replace advice given to you by your health care provider. Make sure you discuss any questions you have with your health care provider. Document Revised: 06/19/2019 Document Reviewed: 12/13/2018 Elsevier Patient Education  2021 [employer]  

## 2020-11-28 NOTE — Patient Instructions (Signed)
[facility] Discharge Instructions for Patients Receiving Chemotherapy  Today you received the following chemotherapy agents: Imfinzi.  To help prevent nausea and vomiting after your treatment, we encourage you to take your nausea medication as directed.   If you develop nausea and vomiting that is not controlled by your nausea medication, call the clinic.   BELOW ARE SYMPTOMS THAT SHOULD BE REPORTED IMMEDIATELY:  *FEVER GREATER THAN 100.5 F  *CHILLS WITH OR WITHOUT FEVER  NAUSEA AND VOMITING THAT IS NOT CONTROLLED WITH YOUR NAUSEA MEDICATION  *UNUSUAL SHORTNESS OF BREATH  *UNUSUAL BRUISING OR BLEEDING  TENDERNESS IN MOUTH AND THROAT WITH OR WITHOUT PRESENCE OF ULCERS  *URINARY PROBLEMS  *BOWEL PROBLEMS  UNUSUAL RASH Items with * indicate a potential emergency and should be followed up as soon as possible.  Feel free to call the clinic should you have any questions or concerns. The clinic phone number is [phone].  Please show the CHEMO ALERT CARD at check-in to the Emergency Department and triage nurse.   

## 2020-11-28 NOTE — Progress Notes (Signed)
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  PRIOR THERAPY: None  CURRENT THERAPY: Palliative systemic chemotherapy with carboplatin for AUC of 5 on day 1, etoposide 100 mg/M2 on days 1, 2 and 3 as well as Imfinzi 1500 mg every 3 weeks with the chemotherapy and Neulasta support. First dose on 03/16/2019. Status post 23 cycles.   Starting from cycle #5 the patient will be treated with maintenance treatment with Imfinzi 1500 mg IV every 4 weeks.  INTERVAL HISTORY: Brittany Marks 80 y.o. female returns to the clinic today for follow-up visit.  The patient is feeling fine today with no concerning complaints except for dry cough and she is using cough drops.  She denied having any current chest pain, shortness of breath or hemoptysis.  She denied having any fever or chills.  She has no nausea, vomiting, diarrhea or constipation.  She has no headache or visual changes.  She continues to tolerate her treatment with Imfinzi fairly well.  The patient had repeat CT scan of the chest, abdomen pelvis performed recently and she is here for evaluation and discussion of her risk her results before starting cycle #24.  MEDICAL HISTORY: Past Medical History:  Diagnosis Date  . Cataract    Bilateral  . Constipation    pain medication  . Left rotator cuff tear   . Right rotator cuff tear   . SCL CA dx'd 11/2018    ALLERGIES:  has No Known Allergies.  MEDICATIONS:  Current Outpatient Medications  Medication Sig Dispense Refill  . Ascorbic Acid (VITAMIN C PO) Take by mouth daily.     Marland Kitchen b complex vitamins  tablet Take 1 tablet by mouth daily.    . COD LIVER OIL PO Take 1 tablet by mouth daily.     . cyclobenzaprine (FLEXERIL) 10 MG tablet Take 1 tablet (10 mg total) by mouth 3 (three) times daily as needed for muscle spasms. 30 tablet 1  . HYDROcodone-acetaminophen (NORCO) 5-325 MG tablet Take 1 tablet by mouth every 6 (six) hours as needed for moderate pain. 20 tablet 0  . hydrocortisone 2.5 % cream Apply topically as needed. (Patient taking differently: Apply 1 application topically daily as needed (Treatment). ) 3.5 g 0  . ibuprofen (ADVIL) 200 MG tablet Take 600 mg by mouth daily.    Marland Kitchen lidocaine-prilocaine (EMLA) cream Apply 1 application topically every 30 (thirty) days.     . Multiple Vitamin (MULTIVITAMIN) tablet Take 1 tablet by mouth daily.    . ondansetron (ZOFRAN) 4 MG tablet Take 1 tablet (4 mg total) by mouth every 8 (eight) hours as needed for nausea or vomiting. 10 tablet 0  . oxymetazoline (AFRIN) 0.05 % nasal spray Place 2 sprays into the nose 2 (two) times daily as needed (Bloody nose).     Marland Kitchen prochlorperazine (COMPAZINE) 10 MG tablet Take 1 tablet (10 mg total) by mouth every 6 (six) hours as needed for nausea or vomiting. 30 tablet 0  . silver sulfADIAZINE (SILVADENE) 1 % cream Apply 1 application topically 2 (two) times daily. 50 g 1  . sodium chloride (OCEAN) 0.65 % nasal spray Place  1 spray into the nose at bedtime.     Marland Kitchen VITAMIN E PO Take 1 tablet by mouth daily.      No current facility-administered medications for this visit.   Facility-Administered Medications Ordered in Other Visits  Medication Dose Route Frequency Provider Last Rate Last Admin  . heparin lock flush 100 unit/mL  500 Units Intracatheter Once PRN Magrinat, Virgie Dad, MD      . sodium chloride flush (NS) 0.9 % injection 10 mL  10 mL Intracatheter PRN Magrinat, Virgie Dad, MD        SURGICAL HISTORY:  Past Surgical History:  Procedure Laterality Date  . CATARACT EXTRACTION W/ INTRAOCULAR LENS  IMPLANT,  BILATERAL  2012  . DECOMPRESSIVE LUMBAR LAMINECTOMY LEVEL 2 N/A 07/29/2013   Procedure: LUMBAR LAMINECTOMY, DECOMPRESSION LUMBAR THREE TO FOUR, FOUR TO FIVE microdiscectomy l3,4 right;  Surgeon: Tobi Bastos, MD;  Location: [facility];  Service: Orthopedics;  Laterality: N/A;  . I & D SUPERIOR RIGHT SHOULDER AND CLOSURE WOUND  01-10-2011   S/P ROTATOR CUFF REPAIR  . IR IMAGING GUIDED PORT INSERTION  04/09/2019  . LUMBAR LAMINECTOMY  1970'S  . LUMBAR LAMINECTOMY/DECOMPRESSION MICRODISCECTOMY N/A 06/27/2016   Procedure: L1 - L2 DISCECTOMY;  Surgeon: Melina Schools, MD;  Location: [facility];  Service: Orthopedics;  Laterality: N/A;  . [place]  . REVERSE SHOULDER ARTHROPLASTY Left 03/10/2020   Procedure: REVERSE SHOULDER ARTHROPLASTY;  Surgeon: Justice Britain, MD;  Location: [facility];  Service: Orthopedics;  Laterality: Left;  18min  . RIGHT SHOULDER ARTHROSCOPY/ OPEN DISTAL CLAVICLE RESECTION/ SAD/ OPEN ROTATOR CUFF REPAIR  11-28-2010  . SHOULDER FUSION SURGERY  03/11/2020   Shoulder Surgery , hardware placed  . SHOULDER OPEN ROTATOR CUFF REPAIR  12/19/2011   Procedure: ROTATOR CUFF REPAIR SHOULDER OPEN;  Surgeon: Magnus Sinning, MD;  Location: [facility];  Service: Orthopedics;  Laterality: Right;  RIGHT RECURRENT OPEN REPAIR OF THE ROTATOR CUFF WITH TISSUE MEND GRAFTANTERIOR CHROMIOECTOMY  . VAGINAL HYSTERECTOMY  1979    REVIEW OF SYSTEMS:  Constitutional: negative Eyes: negative Ears, nose, mouth, throat, and face: negative Respiratory: positive for cough Cardiovascular: negative Gastrointestinal: negative Genitourinary:negative Integument/breast: negative Hematologic/lymphatic: negative Musculoskeletal:negative Neurological: negative Behavioral/Psych: negative Endocrine: negative Allergic/Immunologic: negative   PHYSICAL EXAMINATION: General appearance: alert, cooperative and no distress Head: Normocephalic, without obvious abnormality, atraumatic Neck:  no adenopathy, no JVD, supple, symmetrical, trachea midline and thyroid not enlarged, symmetric, no tenderness/mass/nodules Lymph nodes: Cervical, supraclavicular, and axillary nodes normal. Resp: clear to auscultation bilaterally Back: symmetric, no curvature. ROM normal. No CVA tenderness. Cardio: regular rate and rhythm, S1, S2 normal, no murmur, click, rub or gallop GI: soft, non-tender; bowel sounds normal; no masses,  no organomegaly Extremities: extremities normal, atraumatic, no cyanosis or edema Neurologic: Alert and oriented X 3, normal strength and tone. Normal symmetric reflexes. Normal coordination and gait   ECOG PERFORMANCE STATUS: 1 - Symptomatic but completely ambulatory  Blood pressure (!) 159/81, pulse 75, temperature (!) 97.3 F (36.3 C), resp. rate 15, height 5\' 2"  (1.575 m), weight 142 lb 3.2 oz (64.5 kg), SpO2 98 %.  LABORATORY DATA: Lab Results  Component Value Date   WBC 8.3 11/28/2020   HGB 11.7 (L) 11/28/2020   HCT 34.2 (L) 11/28/2020   MCV 93.4 11/28/2020   PLT 240 11/28/2020      Chemistry      Component Value Date/Time   NA 134 (L) 10/31/2020 1148   K 4.0 10/31/2020 1148  CL 105 10/31/2020 1148   CO2 23 10/31/2020 1148   BUN 16 10/31/2020 1148   CREATININE 0.73 10/31/2020 1148   CREATININE 0.76 12/21/2019 1100      Component Value Date/Time   CALCIUM 8.9 10/31/2020 1148   ALKPHOS 73 10/31/2020 1148   AST 23 10/31/2020 1148   AST 22 12/21/2019 1100   ALT 15 10/31/2020 1148   ALT 13 12/21/2019 1100   BILITOT 0.4 10/31/2020 1148   BILITOT 0.3 12/21/2019 1100       RADIOGRAPHIC STUDIES: CT Chest W Contrast  Result Date: 11/25/2020 CLINICAL DATA:  Small-cell lung cancer.  Restaging. EXAM: CT CHEST, ABDOMEN, AND PELVIS WITH CONTRAST TECHNIQUE: Multidetector CT imaging of the chest, abdomen and pelvis was performed following the standard protocol during bolus administration of intravenous contrast. CONTRAST:  115mL OMNIPAQUE IOHEXOL 300 MG/ML   SOLN COMPARISON:  08/04/2020 FINDINGS: CT CHEST FINDINGS Cardiovascular: The heart size is normal. No substantial pericardial effusion. Coronary artery calcification is evident. Atherosclerotic calcification is noted in the wall of the thoracic aorta. Right Port-A-Cath tip is positioned in the mid right atrium. Mediastinum/Nodes: Adjacent low paratracheal lymph nodes are mildly enlarged on image 22/2 measuring 1.2 and 1.4 cm respectively, similar to prior. 1.7 cm short axis precarinal lymph node on 25/2 was 1.5 cm previously (remeasured). There is no hilar lymphadenopathy. The esophagus has normal imaging features. There is no axillary lymphadenopathy. Lungs/Pleura: Centrilobular and paraseptal emphysema evident. Subpleural reticulation again noted in both lungs. Area of consolidative nodular opacity the anterior left upper lobe measuring approximately 2.0 x 0.9 cm is stable in the interval. Musculoskeletal: Status post left shoulder replacement. Degenerative changes noted right shoulder. Bones are diffusely demineralized CT ABDOMEN PELVIS FINDINGS Hepatobiliary: No suspicious focal abnormality within the liver parenchyma. There is no evidence for gallstones, gallbladder wall thickening, or pericholecystic fluid. No intrahepatic or extrahepatic biliary dilation. Pancreas: No focal mass lesion. No dilatation of the main duct. No intraparenchymal cyst. No peripancreatic edema. Spleen: No splenomegaly. No focal mass lesion. Adrenals/Urinary Tract: No adrenal nodule or mass. Large right extrarenal pelvis is similar to prior mild fullness of the right intrarenal collecting system. Left kidney unremarkable. No evidence for hydroureter. The urinary bladder appears normal for the degree of distention. Stomach/Bowel: Small hiatal hernia. Stomach is unremarkable. No gastric wall thickening. No evidence of outlet obstruction. Duodenum is normally positioned as is the ligament of Treitz. No small bowel wall thickening. No  small bowel dilatation. The terminal ileum is normal. The appendix is normal. No gross colonic mass. No colonic wall thickening. Vascular/Lymphatic: There is abdominal aortic atherosclerosis without aneurysm. 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 There is no gastrohepatic or hepatoduodenal ligament lymphadenopathy. No retroperitoneal or mesenteric lymphadenopathy. No pelvic sidewall lymphadenopathy. Reproductive: The uterus is surgically absent. There is no adnexal mass. Other: No intraperitoneal free fluid. Musculoskeletal: No worrisome lytic or sclerotic osseous abnormality. Marked degenerative changes in the lumbar spine. IMPRESSION: 1. Stable exam. Index right paratracheal and precarinal lymph nodes are stable to minimally increased in the interval. 2. Stable appearance of the consolidative nodular opacity in the anterior left upper lobe, likely scar. Continued attention on follow-up recommended. 3. 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 4. Small hiatal hernia. 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 6. Aortic Atherosclerosis (ICD10-I70.0) and Emphysema (ICD10-J43.9). Electronically Signed   By: Misty Stanley M.D.   On: 11/25/2020 09:20   CT Abdomen Pelvis W Contrast  Result Date: 11/25/2020 CLINICAL DATA:  Small-cell lung cancer.  Restaging. EXAM: CT CHEST, ABDOMEN, AND PELVIS WITH CONTRAST TECHNIQUE: Multidetector CT imaging of the chest, abdomen and pelvis was performed following the standard protocol during bolus administration of intravenous contrast. CONTRAST:  138mL OMNIPAQUE IOHEXOL 300 MG/ML  SOLN COMPARISON:  08/04/2020 FINDINGS: CT CHEST FINDINGS Cardiovascular: The heart size is normal. No substantial pericardial effusion. Coronary artery calcification is evident. Atherosclerotic calcification is noted in the wall of the thoracic aorta. Right Port-A-Cath tip is positioned in the mid right atrium. Mediastinum/Nodes: Adjacent low  paratracheal lymph nodes are mildly enlarged on image 22/2 measuring 1.2 and 1.4 cm respectively, similar to prior. 1.7 cm short axis precarinal lymph node on 25/2 was 1.5 cm previously (remeasured). There is no hilar lymphadenopathy. The esophagus has normal imaging features. There is no axillary lymphadenopathy. Lungs/Pleura: Centrilobular and paraseptal emphysema evident. Subpleural reticulation again noted in both lungs. Area of consolidative nodular opacity the anterior left upper lobe measuring approximately 2.0 x 0.9 cm is stable in the interval. Musculoskeletal: Status post left shoulder replacement. Degenerative changes noted right shoulder. Bones are diffusely demineralized CT ABDOMEN PELVIS FINDINGS Hepatobiliary: No suspicious focal abnormality within the liver parenchyma. There is no evidence for gallstones, gallbladder wall thickening, or pericholecystic fluid. No intrahepatic or extrahepatic biliary dilation. Pancreas: No focal mass lesion. No dilatation of the main duct. No intraparenchymal cyst. No peripancreatic edema. Spleen: No splenomegaly. No focal mass lesion. Adrenals/Urinary Tract: No adrenal nodule or mass. Large right extrarenal pelvis is similar to prior mild fullness of the right intrarenal collecting system. Left kidney unremarkable. No evidence for hydroureter. The urinary bladder appears normal for the degree of distention. Stomach/Bowel: Small hiatal hernia. Stomach is unremarkable. No gastric wall thickening. No evidence of outlet obstruction. Duodenum is normally positioned as is the ligament of Treitz. No small bowel wall thickening. No small bowel dilatation. The terminal ileum is normal. The appendix is normal. No gross colonic mass. No colonic wall thickening. Vascular/Lymphatic: There is abdominal aortic atherosclerosis without aneurysm. 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 There is no gastrohepatic or hepatoduodenal ligament lymphadenopathy. No  retroperitoneal or mesenteric lymphadenopathy. No pelvic sidewall lymphadenopathy. Reproductive: The uterus is surgically absent. There is no adnexal mass. Other: No intraperitoneal free fluid. Musculoskeletal: No worrisome lytic or sclerotic osseous abnormality. Marked degenerative changes in the lumbar spine. IMPRESSION: 1. Stable exam. Index right paratracheal and precarinal lymph nodes are stable to minimally increased in the interval. 2. Stable appearance of the consolidative nodular opacity in the anterior left upper lobe, likely scar. Continued attention on follow-up recommended. 3. 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 4. Small hiatal hernia. 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 6. Aortic Atherosclerosis (ICD10-I70.0) and Emphysema (ICD10-J43.9). Electronically Signed   By: Misty Stanley M.D.   On: 11/25/2020 09:20    ASSESSMENT AND PLAN: This is a very pleasant 80 years old white female with extensive stage small cell lung cancer and she is currently undergoing treatment with carboplatin, etoposide and Imfinzi status post 5 cycles.  Starting from cycle #6 the patient is on maintenance treatment with single agent Imfinzi every 4 weeks status post 23 cycles of total treatment. The patient continues to tolerate her treatment fairly well with no concerning adverse effects. She had repeat CT scan of the chest, abdomen pelvis performed recently.  I personally and independently reviewed the scans and discussed the results with the patient today. Her scan showed no concerning findings for disease progression. I recommended  for the patient to continue her current treatment with Imfinzi and she will proceed with cycle #24 today as planned. I will see her back for follow-up visit in 4 weeks for evaluation before starting cycle #25. The patient was advised to call immediately if she has any other concerning symptoms in the interval.  The patient voices understanding of  current disease status and treatment options and is in agreement with the current care plan.  All questions were answered. The patient knows to call the clinic with any problems, questions or concerns. We can certainly see the patient much sooner if necessary.  Disclaimer: This note was dictated with voice recognition software. Similar sounding words can inadvertently be transcribed and may not be corrected upon review.

## 2020-11-30 ENCOUNTER — Telehealth: Payer: Self-pay | Admitting: Internal Medicine

## 2020-11-30 NOTE — Telephone Encounter (Signed)
Scheduled appts per 2/21 los. Pt to get updated appt calendar at next visit per appt notes.

## 2020-12-12 ENCOUNTER — Other Ambulatory Visit: Payer: Self-pay

## 2020-12-12 ENCOUNTER — Ambulatory Visit (HOSPITAL_COMMUNITY)
Admission: RE | Admit: 2020-12-12 | Discharge: 2020-12-12 | Disposition: A | Discharge status: Home / Self Care | Payer: Federal, State, Local not specified - PPO | Source: Ambulatory Visit | Attending: Cardiology | Admitting: Cardiology

## 2020-12-12 DIAGNOSIS — I6523 Occlusion and stenosis of bilateral carotid arteries: Secondary | ICD-10-CM | POA: Insufficient documentation

## 2020-12-26 ENCOUNTER — Inpatient Hospital Stay: Payer: Federal, State, Local not specified - PPO

## 2020-12-26 ENCOUNTER — Inpatient Hospital Stay (HOSPITAL_BASED_OUTPATIENT_CLINIC_OR_DEPARTMENT_OTHER): Payer: Federal, State, Local not specified - PPO | Admitting: Internal Medicine

## 2020-12-26 ENCOUNTER — Other Ambulatory Visit: Payer: Self-pay

## 2020-12-26 ENCOUNTER — Inpatient Hospital Stay: Payer: Federal, State, Local not specified - PPO | Attending: Oncology

## 2020-12-26 VITALS — BP 154/74 | HR 72 | Temp 96.7°F | Resp 18 | Ht 62.0 in | Wt 140.4 lb

## 2020-12-26 DIAGNOSIS — G629 Polyneuropathy, unspecified: Secondary | ICD-10-CM | POA: Insufficient documentation

## 2020-12-26 DIAGNOSIS — C3411 Malignant neoplasm of upper lobe, right bronchus or lung: Secondary | ICD-10-CM | POA: Insufficient documentation

## 2020-12-26 DIAGNOSIS — Z79899 Other long term (current) drug therapy: Secondary | ICD-10-CM | POA: Insufficient documentation

## 2020-12-26 DIAGNOSIS — Z95828 Presence of other vascular implants and grafts: Secondary | ICD-10-CM

## 2020-12-26 DIAGNOSIS — K5900 Constipation, unspecified: Secondary | ICD-10-CM | POA: Insufficient documentation

## 2020-12-26 DIAGNOSIS — I2510 Atherosclerotic heart disease of native coronary artery without angina pectoris: Secondary | ICD-10-CM | POA: Diagnosis not present

## 2020-12-26 DIAGNOSIS — C7951 Secondary malignant neoplasm of bone: Secondary | ICD-10-CM | POA: Insufficient documentation

## 2020-12-26 DIAGNOSIS — Z9221 Personal history of antineoplastic chemotherapy: Secondary | ICD-10-CM | POA: Insufficient documentation

## 2020-12-26 DIAGNOSIS — Z1211 Encounter for screening for malignant neoplasm of colon: Secondary | ICD-10-CM

## 2020-12-26 DIAGNOSIS — Z5112 Encounter for antineoplastic immunotherapy: Secondary | ICD-10-CM | POA: Insufficient documentation

## 2020-12-26 DIAGNOSIS — F17210 Nicotine dependence, cigarettes, uncomplicated: Secondary | ICD-10-CM | POA: Insufficient documentation

## 2020-12-26 DIAGNOSIS — R937 Abnormal findings on diagnostic imaging of other parts of musculoskeletal system: Secondary | ICD-10-CM

## 2020-12-26 LAB — CBC WITH DIFFERENTIAL/PLATELET
Abs Immature Granulocytes: 0.03 10*3/uL (ref 0.00–0.07)
Basophils Absolute: 0.1 10*3/uL (ref 0.0–0.1)
Basophils Relative: 1 %
Eosinophils Absolute: 0.3 10*3/uL (ref 0.0–0.5)
Eosinophils Relative: 4 %
HCT: 34.7 % — ABNORMAL LOW (ref 36.0–46.0)
Hemoglobin: 11.9 g/dL — ABNORMAL LOW (ref 12.0–15.0)
Immature Granulocytes: 0 %
Lymphocytes Relative: 17 %
Lymphs Abs: 1.3 10*3/uL (ref 0.7–4.0)
MCH: 32.1 pg (ref 26.0–34.0)
MCHC: 34.3 g/dL (ref 30.0–36.0)
MCV: 93.5 fL (ref 80.0–100.0)
Monocytes Absolute: 0.8 10*3/uL (ref 0.1–1.0)
Monocytes Relative: 9 %
Neutro Abs: 5.6 10*3/uL (ref 1.7–7.7)
Neutrophils Relative %: 69 %
Platelets: 234 10*3/uL (ref 150–400)
RBC: 3.71 MIL/uL — ABNORMAL LOW (ref 3.87–5.11)
RDW: 12.8 % (ref 11.5–15.5)
WBC: 8.1 10*3/uL (ref 4.0–10.5)
nRBC: 0 % (ref 0.0–0.2)

## 2020-12-26 LAB — COMPREHENSIVE METABOLIC PANEL
ALT: 16 U/L (ref 0–44)
AST: 24 U/L (ref 15–41)
Albumin: 3.7 g/dL (ref 3.5–5.0)
Alkaline Phosphatase: 76 U/L (ref 38–126)
Anion gap: 5 (ref 5–15)
BUN: 18 mg/dL (ref 8–23)
CO2: 23 mmol/L (ref 22–32)
Calcium: 9 mg/dL (ref 8.9–10.3)
Chloride: 104 mmol/L (ref 98–111)
Creatinine, Ser: 0.7 mg/dL (ref 0.44–1.00)
GFR, Estimated: >60 mL/min (ref >60)
Glucose, Bld: 115 mg/dL — ABNORMAL HIGH (ref 70–99)
Potassium: 3.8 mmol/L (ref 3.5–5.1)
Sodium: 132 mmol/L — ABNORMAL LOW (ref 135–145)
Total Bilirubin: 0.4 mg/dL (ref 0.3–1.2)
Total Protein: 7.2 g/dL (ref 6.5–8.1)

## 2020-12-26 MED ORDER — SODIUM CHLORIDE 0.9% FLUSH
10.0000 mL | INTRAVENOUS | Status: DC | PRN
  Administered 2020-12-26: 10 mL
  Filled 2020-12-26: qty 10

## 2020-12-26 MED ORDER — HEPARIN SOD (PORK) LOCK FLUSH 100 UNIT/ML IV SOLN
500.0000 [IU] | Freq: Once | INTRAVENOUS | Status: AC | PRN
  Administered 2020-12-26: 500 [IU]
  Filled 2020-12-26: qty 5

## 2020-12-26 MED ORDER — SODIUM CHLORIDE 0.9 % IV SOLN
Freq: Once | INTRAVENOUS | Status: AC
  Filled 2020-12-26: qty 250

## 2020-12-26 MED ORDER — SODIUM CHLORIDE 0.9 % IV SOLN
1500.0000 mg | Freq: Once | INTRAVENOUS | Status: AC
  Administered 2020-12-26: 1500 mg via INTRAVENOUS
  Filled 2020-12-26: qty 30

## 2020-12-26 NOTE — Progress Notes (Signed)
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  PRIOR THERAPY: None  CURRENT THERAPY: Palliative systemic chemotherapy with carboplatin for AUC of 5 on day 1, etoposide 100 mg/M2 on days 1, 2 and 3 as well as Imfinzi 1500 mg every 3 weeks with the chemotherapy and Neulasta support. First dose on 03/16/2019. Status post 24 cycles.   Starting from cycle #5 the patient will be treated with maintenance treatment with Imfinzi 1500 mg IV every 4 weeks.  INTERVAL HISTORY: Brittany Marks 80 y.o. female returns to the clinic today for follow-up visit.  The patient is feeling fine today with no concerning complaints except for peripheral neuropathy that has been getting worse recently.  She denied having any current chest pain, shortness of breath, cough.  Or hemoptysis.  She denied having any fever or chills.  She has no nausea, vomiting, diarrhea or constipation.  She has no headache or visual changes.  She has no weight loss or night sweats.  She is here today for evaluation before starting cycle #25.  MEDICAL HISTORY: Past Medical History:  Diagnosis Date  . Cataract    Bilateral  . Constipation    pain medication  . Left rotator cuff tear   . Right rotator cuff tear   . SCL CA dx'd 11/2018    ALLERGIES:  has No Known Allergies.  MEDICATIONS:  Current Outpatient Medications  Medication Sig Dispense Refill  . Ascorbic Acid (VITAMIN C PO) Take by mouth daily.     Marland Kitchen b complex vitamins tablet Take 1 tablet by mouth daily.    . COD LIVER OIL PO Take 1 tablet by mouth daily.     . cyclobenzaprine  (FLEXERIL) 10 MG tablet Take 1 tablet (10 mg total) by mouth 3 (three) times daily as needed for muscle spasms. 30 tablet 1  . HYDROcodone-acetaminophen (NORCO) 5-325 MG tablet Take 1 tablet by mouth every 6 (six) hours as needed for moderate pain. 20 tablet 0  . hydrocortisone 2.5 % cream Apply topically as needed. (Patient taking differently: Apply 1 application topically daily as needed (Treatment). ) 3.5 g 0  . ibuprofen (ADVIL) 200 MG tablet Take 600 mg by mouth daily.    Marland Kitchen lidocaine-prilocaine (EMLA) cream Apply 1 application topically every 30 (thirty) days.     . Multiple Vitamin (MULTIVITAMIN) tablet Take 1 tablet by mouth daily.    . ondansetron (ZOFRAN) 4 MG tablet Take 1 tablet (4 mg total) by mouth every 8 (eight) hours as needed for nausea or vomiting. 10 tablet 0  . oxymetazoline (AFRIN) 0.05 % nasal spray Place 2 sprays into the nose 2 (two) times daily as needed (Bloody nose).     Marland Kitchen prochlorperazine (COMPAZINE) 10 MG tablet Take 1 tablet (10 mg total) by mouth every 6 (six) hours as needed for nausea or vomiting. 30 tablet 0  . silver sulfADIAZINE (SILVADENE) 1 % cream Apply 1 application topically 2 (two) times daily. 50 g 1  . sodium chloride (OCEAN) 0.65 % nasal spray Place 1 spray into the nose at bedtime.     Marland Kitchen VITAMIN E PO Take 1 tablet by mouth  daily.      No current facility-administered medications for this visit.   Facility-Administered Medications Ordered in Other Visits  Medication Dose Route Frequency Provider Last Rate Last Admin  . heparin lock flush 100 unit/mL  500 Units Intracatheter Once PRN Magrinat, Virgie Dad, MD      . sodium chloride flush (NS) 0.9 % injection 10 mL  10 mL Intracatheter PRN Magrinat, Virgie Dad, MD        SURGICAL HISTORY:  Past Surgical History:  Procedure Laterality Date  . CATARACT EXTRACTION W/ INTRAOCULAR LENS  IMPLANT, BILATERAL  2012  . DECOMPRESSIVE LUMBAR LAMINECTOMY LEVEL 2 N/A 07/29/2013   Procedure: LUMBAR LAMINECTOMY,  DECOMPRESSION LUMBAR THREE TO FOUR, FOUR TO FIVE microdiscectomy l3,4 right;  Surgeon: Tobi Bastos, MD;  Location: [facility];  Service: Orthopedics;  Laterality: N/A;  . I & D SUPERIOR RIGHT SHOULDER AND CLOSURE WOUND  01-10-2011   S/P ROTATOR CUFF REPAIR  . IR IMAGING GUIDED PORT INSERTION  04/09/2019  . LUMBAR LAMINECTOMY  1970'S  . LUMBAR LAMINECTOMY/DECOMPRESSION MICRODISCECTOMY N/A 06/27/2016   Procedure: L1 - L2 DISCECTOMY;  Surgeon: Melina Schools, MD;  Location: [facility];  Service: Orthopedics;  Laterality: N/A;  . [place]  . REVERSE SHOULDER ARTHROPLASTY Left 03/10/2020   Procedure: REVERSE SHOULDER ARTHROPLASTY;  Surgeon: Justice Britain, MD;  Location: [facility];  Service: Orthopedics;  Laterality: Left;  154min  . RIGHT SHOULDER ARTHROSCOPY/ OPEN DISTAL CLAVICLE RESECTION/ SAD/ OPEN ROTATOR CUFF REPAIR  11-28-2010  . SHOULDER FUSION SURGERY  03/11/2020   Shoulder Surgery , hardware placed  . SHOULDER OPEN ROTATOR CUFF REPAIR  12/19/2011   Procedure: ROTATOR CUFF REPAIR SHOULDER OPEN;  Surgeon: Magnus Sinning, MD;  Location: [place];  Service: Orthopedics;  Laterality: Right;  RIGHT RECURRENT OPEN REPAIR OF THE ROTATOR CUFF WITH TISSUE MEND GRAFTANTERIOR CHROMIOECTOMY  . VAGINAL HYSTERECTOMY  1979    REVIEW OF SYSTEMS:  A comprehensive review of systems was negative except for: Constitutional: positive for fatigue Neurological: positive for paresthesia   PHYSICAL EXAMINATION: General appearance: alert, cooperative, fatigued and no distress Head: Normocephalic, without obvious abnormality, atraumatic Neck: no adenopathy, no JVD, supple, symmetrical, trachea midline and thyroid not enlarged, symmetric, no tenderness/mass/nodules Lymph nodes: Cervical, supraclavicular, and axillary nodes normal. Resp: clear to auscultation bilaterally Back: symmetric, no curvature. ROM normal. No CVA tenderness. Cardio: regular rate and rhythm, S1, S2 normal, no murmur,  click, rub or gallop GI: soft, non-tender; bowel sounds normal; no masses,  no organomegaly Extremities: extremities normal, atraumatic, no cyanosis or edema   ECOG PERFORMANCE STATUS: 1 - Symptomatic but completely ambulatory  Blood pressure (!) 154/74, pulse 72, temperature (!) 96.7 F (35.9 C), temperature source Tympanic, resp. rate 18, height 5\' 2"  (1.575 m), weight 140 lb 6.4 oz (63.7 kg), SpO2 98 %.  LABORATORY DATA: Lab Results  Component Value Date   WBC 8.1 12/26/2020   HGB 11.9 (L) 12/26/2020   HCT 34.7 (L) 12/26/2020   MCV 93.5 12/26/2020   PLT 234 12/26/2020      Chemistry      Component Value Date/Time   NA 136 11/28/2020 1240   K 4.3 11/28/2020 1240   CL 105 11/28/2020 1240   CO2 23 11/28/2020 1240   BUN 16 11/28/2020 1240   CREATININE 0.72 11/28/2020 1240   CREATININE 0.76 12/21/2019 1100      Component Value Date/Time   CALCIUM 8.9 11/28/2020 1240   ALKPHOS 83 11/28/2020 1240  AST 23 11/28/2020 1240   AST 22 12/21/2019 1100   ALT 15 11/28/2020 1240   ALT 13 12/21/2019 1100   BILITOT 0.3 11/28/2020 1240   BILITOT 0.3 12/21/2019 1100       RADIOGRAPHIC STUDIES: VAS US CAROTID  Result Date: 12/13/2020 Carotid Arterial Duplex Study Indications:       Carotid artery disease.                    No cerebrovascular symptoms at this time. Risk Factors:      Current smoker, coronary artery disease. Other Factors:     Metastatic lung cancer. Comparison Study:  Previous carotid duplex performed 12/11/19 showed RICA                    velocities of 413/24 cm/sec and LICA velocities of 401/02                    cm/sec. >50% left ECA stenosis, left subclavian steal with                    retrograde vertebral artery. Performing Technologist: Mariane Masters RVT  Examination Guidelines: A complete evaluation includes B-mode imaging, spectral Doppler, color Doppler, and power Doppler as needed of all accessible portions of each vessel. Bilateral testing is considered an  integral part of a complete examination. Limited examinations for reoccurring indications may be performed as noted.  Right Carotid Findings: +----------+--------+--------+--------+------------------+--------+           PSV cm/sEDV cm/sStenosisPlaque DescriptionComments +----------+--------+--------+--------+------------------+--------+ CCA Prox  118     13                                         +----------+--------+--------+--------+------------------+--------+ CCA Mid   64      12                                         +----------+--------+--------+--------+------------------+--------+ CCA Distal87      17      <50%    heterogenous               +----------+--------+--------+--------+------------------+--------+ ICA Prox  178     26      1-39%   heterogenous               +----------+--------+--------+--------+------------------+--------+ ICA Mid   59      13                                         +----------+--------+--------+--------+------------------+--------+ ICA Distal36      9                                          +----------+--------+--------+--------+------------------+--------+ ECA       323     33      >50%    calcific                   +----------+--------+--------+--------+------------------+--------+ +----------+--------+-------+--------+-------------------+           PSV cm/sEDV cmsDescribeArm Pressure (mmHG) +----------+--------+-------+--------+-------------------+ [id number]  Stenotic150                 +----------+--------+-------+--------+-------------------+ +---------+--------+--+--------+--+---------+ VertebralPSV cm/s72EDV cm/s13Antegrade +---------+--------+--+--------+--+---------+ Right brachial artery is patent with triphasic flow and PSV of 92 cm/sec. Left Carotid Findings: +----------+--------+--------+--------+------------------+---------------------+           PSV cm/sEDV  cm/sStenosisPlaque DescriptionComments              +----------+--------+--------+--------+------------------+---------------------+ CCA Prox  123     22              calcific                                +----------+--------+--------+--------+------------------+---------------------+ CCA Mid   170     23      <50%    heterogenous                            +----------+--------+--------+--------+------------------+---------------------+ CCA Distal102     24              heterogenous                            +----------+--------+--------+--------+------------------+---------------------+ ICA Prox  242     34      40-59%  heterogenous      based on PSV and                                                          plaque formation      +----------+--------+--------+--------+------------------+---------------------+ ICA Mid   134     19                                                      +----------+--------+--------+--------+------------------+---------------------+ ICA Distal57      14                                                      +----------+--------+--------+--------+------------------+---------------------+ ECA       285     15      >50%    calcific                                +----------+--------+--------+--------+------------------+---------------------+ +----------+--------+--------+-------------------+-------------------+           PSV cm/sEDV cm/sDescribe           Arm Pressure (mmHG) +----------+--------+--------+-------------------+-------------------+ [id number]              Turbulent, dampened140                 +----------+--------+--------+-------------------+-------------------+ +---------+--------+--+--------+-+---------------+ VertebralPSV cm/s16EDV cm/s0Bi- directional +---------+--------+--+--------+-+---------------+ Left brachial artery is patent with biphasic blunted flow and PSV of 69 cm/sec.  Summary:  Right Carotid: Velocities in the right ICA are consistent with a 1-39% stenosis.  The ECA appears >50% stenosed. Left Carotid: Velocities in the left ICA are consistent with a 40-59% stenosis,               based on PSV and plaque formation. Non-hemodynamically significant               plaque <50% noted in the CCA. The ECA appears >50% stenosed. Vertebrals:  Right vertebral artery demonstrates antegrade flow. Left vertebral              artery demonstrates bidirectional flow. Subclavians: Right subclavian artery was stenotic. Left subclavian artery was              turbulent/dampened. Triphasic right brachial artery waveform.              Dampened bipahsic left brachial artery waveform. 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 *See table(s) above for measurements and observations. Suggest follow up study in 12 months. Electronically signed by Quay Burow MD on 12/13/2020 at 6:53:43 AM.    Final     ASSESSMENT AND PLAN: This is a very pleasant 80 years old white female with extensive stage small cell lung cancer and she is currently undergoing treatment with carboplatin, etoposide and Imfinzi status post 5 cycles.  Starting from cycle #6 the patient is on maintenance treatment with single agent Imfinzi every 4 weeks status post 24 cycles of total treatment. The patient continues to tolerate this treatment well with no concerning complaints except for the peripheral neuropathy. I recommended for her to proceed with cycle #25 today as planned. For the peripheral neuropathy I gave her the option of treatment with gabapentin but the patient would like to hold on this option for now. I will see her back for follow-up visit in 4 weeks for evaluation before the next cycle of her treatment. She was advised to call immediately if she has any concerning symptoms in the interval. The patient voices understanding of current disease status and treatment options and is in  agreement with the current care plan.  All questions were answered. The patient knows to call the clinic with any problems, questions or concerns. We can certainly see the patient much sooner if necessary.  Disclaimer: This note was dictated with voice recognition software. Similar sounding words can inadvertently be transcribed and may not be corrected upon review.

## 2021-01-23 ENCOUNTER — Inpatient Hospital Stay: Payer: Federal, State, Local not specified - PPO | Attending: Oncology

## 2021-01-23 ENCOUNTER — Inpatient Hospital Stay (HOSPITAL_BASED_OUTPATIENT_CLINIC_OR_DEPARTMENT_OTHER): Payer: Federal, State, Local not specified - PPO | Admitting: Internal Medicine

## 2021-01-23 ENCOUNTER — Encounter: Payer: Self-pay | Admitting: *Deleted

## 2021-01-23 ENCOUNTER — Encounter: Payer: Self-pay | Admitting: Internal Medicine

## 2021-01-23 ENCOUNTER — Other Ambulatory Visit: Payer: Self-pay

## 2021-01-23 ENCOUNTER — Inpatient Hospital Stay: Payer: Federal, State, Local not specified - PPO

## 2021-01-23 VITALS — BP 160/69 | HR 73 | Temp 97.6°F | Resp 18 | Ht 62.0 in | Wt 141.7 lb

## 2021-01-23 DIAGNOSIS — C3490 Malignant neoplasm of unspecified part of unspecified bronchus or lung: Secondary | ICD-10-CM | POA: Diagnosis not present

## 2021-01-23 DIAGNOSIS — Z1211 Encounter for screening for malignant neoplasm of colon: Secondary | ICD-10-CM

## 2021-01-23 DIAGNOSIS — Z5112 Encounter for antineoplastic immunotherapy: Secondary | ICD-10-CM | POA: Insufficient documentation

## 2021-01-23 DIAGNOSIS — Z79899 Other long term (current) drug therapy: Secondary | ICD-10-CM | POA: Insufficient documentation

## 2021-01-23 DIAGNOSIS — C7951 Secondary malignant neoplasm of bone: Secondary | ICD-10-CM | POA: Diagnosis not present

## 2021-01-23 DIAGNOSIS — C3411 Malignant neoplasm of upper lobe, right bronchus or lung: Secondary | ICD-10-CM

## 2021-01-23 DIAGNOSIS — R937 Abnormal findings on diagnostic imaging of other parts of musculoskeletal system: Secondary | ICD-10-CM

## 2021-01-23 LAB — CBC WITH DIFFERENTIAL/PLATELET
Abs Immature Granulocytes: 0.02 10*3/uL (ref 0.00–0.07)
Basophils Absolute: 0 10*3/uL (ref 0.0–0.1)
Basophils Relative: 1 %
Eosinophils Absolute: 0.3 10*3/uL (ref 0.0–0.5)
Eosinophils Relative: 3 %
HCT: 34.3 % — ABNORMAL LOW (ref 36.0–46.0)
Hemoglobin: 11.9 g/dL — ABNORMAL LOW (ref 12.0–15.0)
Immature Granulocytes: 0 %
Lymphocytes Relative: 20 %
Lymphs Abs: 1.5 10*3/uL (ref 0.7–4.0)
MCH: 32.3 pg (ref 26.0–34.0)
MCHC: 34.7 g/dL (ref 30.0–36.0)
MCV: 93.2 fL (ref 80.0–100.0)
Monocytes Absolute: 0.7 10*3/uL (ref 0.1–1.0)
Monocytes Relative: 9 %
Neutro Abs: 5.2 10*3/uL (ref 1.7–7.7)
Neutrophils Relative %: 67 %
Platelets: 228 10*3/uL (ref 150–400)
RBC: 3.68 MIL/uL — ABNORMAL LOW (ref 3.87–5.11)
RDW: 13.1 % (ref 11.5–15.5)
WBC: 7.8 10*3/uL (ref 4.0–10.5)
nRBC: 0 % (ref 0.0–0.2)

## 2021-01-23 LAB — COMPREHENSIVE METABOLIC PANEL
ALT: 12 U/L (ref 0–44)
AST: 22 U/L (ref 15–41)
Albumin: 3.6 g/dL (ref 3.5–5.0)
Alkaline Phosphatase: 77 U/L (ref 38–126)
Anion gap: 8 (ref 5–15)
BUN: 16 mg/dL (ref 8–23)
CO2: 24 mmol/L (ref 22–32)
Calcium: 8.7 mg/dL — ABNORMAL LOW (ref 8.9–10.3)
Chloride: 104 mmol/L (ref 98–111)
Creatinine, Ser: 0.74 mg/dL (ref 0.44–1.00)
GFR, Estimated: >60 mL/min (ref >60)
Glucose, Bld: 114 mg/dL — ABNORMAL HIGH (ref 70–99)
Potassium: 4 mmol/L (ref 3.5–5.1)
Sodium: 136 mmol/L (ref 135–145)
Total Bilirubin: 0.4 mg/dL (ref 0.3–1.2)
Total Protein: 7.1 g/dL (ref 6.5–8.1)

## 2021-01-23 MED ORDER — HEPARIN SOD (PORK) LOCK FLUSH 100 UNIT/ML IV SOLN
500.0000 [IU] | Freq: Once | INTRAVENOUS | Status: AC | PRN
Start: 2021-01-23 — End: 2021-01-23
  Administered 2021-01-23: 500 [IU]
  Filled 2021-01-23: qty 5

## 2021-01-23 MED ORDER — SODIUM CHLORIDE 0.9 % IV SOLN
Freq: Once | INTRAVENOUS | Status: AC
  Filled 2021-01-23: qty 250

## 2021-01-23 MED ORDER — SODIUM CHLORIDE 0.9 % IV SOLN
1500.0000 mg | Freq: Once | INTRAVENOUS | Status: AC
  Administered 2021-01-23: 1500 mg via INTRAVENOUS
  Filled 2021-01-23: qty 30

## 2021-01-23 MED ORDER — SODIUM CHLORIDE 0.9% FLUSH
10.0000 mL | INTRAVENOUS | Status: DC | PRN
  Administered 2021-01-23: 10 mL
  Filled 2021-01-23: qty 10

## 2021-01-23 NOTE — Progress Notes (Signed)
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  PRIOR THERAPY: None  CURRENT THERAPY: Palliative systemic chemotherapy with carboplatin for AUC of 5 on day 1, etoposide 100 mg/M2 on days 1, 2 and 3 as well as Imfinzi 1500 mg every 3 weeks with the chemotherapy and Neulasta support. First dose on 03/16/2019. Status post 25 cycles.   Starting from cycle #5 the patient will be treated with maintenance treatment with Imfinzi 1500 mg IV every 4 weeks.  INTERVAL HISTORY: Brittany Marks 80 y.o. female returns to the clinic today for follow-up visit.  The patient is feeling fine today with no concerning complaints except for present on the left arm.  She is taken ibuprofen with some improvement.  She denied having any current chest pain, shortness of breath, cough or hemoptysis.  She denied having any fever or chills.  She has no nausea, vomiting, diarrhea or constipation.  She has no headache or visual changes.  The patient is here today for evaluation before starting cycle #26 of her treatment.  MEDICAL HISTORY: Past Medical History:  Diagnosis Date  . Cataract    Bilateral  . Constipation    pain medication  . Left rotator cuff tear   . Right rotator cuff tear   . SCL CA dx'd 11/2018    ALLERGIES:  has No Known Allergies.  MEDICATIONS:  Current Outpatient Medications  Medication Sig Dispense Refill  . Ascorbic Acid (VITAMIN C PO) Take by mouth daily.     Marland Kitchen b complex vitamins tablet Take 1 tablet by mouth daily.    . COD LIVER OIL PO Take 1 tablet by mouth daily.     . cyclobenzaprine  (FLEXERIL) 10 MG tablet Take 1 tablet (10 mg total) by mouth 3 (three) times daily as needed for muscle spasms. 30 tablet 1  . HYDROcodone-acetaminophen (NORCO) 5-325 MG tablet Take 1 tablet by mouth every 6 (six) hours as needed for moderate pain. 20 tablet 0  . hydrocortisone 2.5 % cream Apply topically as needed. (Patient taking differently: Apply 1 application topically daily as needed (Treatment). ) 3.5 g 0  . ibuprofen (ADVIL) 200 MG tablet Take 600 mg by mouth daily.    Marland Kitchen lidocaine-prilocaine (EMLA) cream Apply 1 application topically every 30 (thirty) days.     . Multiple Vitamin (MULTIVITAMIN) tablet Take 1 tablet by mouth daily.    . ondansetron (ZOFRAN) 4 MG tablet Take 1 tablet (4 mg total) by mouth every 8 (eight) hours as needed for nausea or vomiting. 10 tablet 0  . oxymetazoline (AFRIN) 0.05 % nasal spray Place 2 sprays into the nose 2 (two) times daily as needed (Bloody nose).     Marland Kitchen prochlorperazine (COMPAZINE) 10 MG tablet Take 1 tablet (10 mg total) by mouth every 6 (six) hours as needed for nausea or vomiting. 30 tablet 0  . silver sulfADIAZINE (SILVADENE) 1 % cream Apply 1 application topically 2 (two) times daily. 50 g 1  . sodium chloride (OCEAN) 0.65 % nasal spray Place 1 spray into the nose at bedtime.     Marland Kitchen VITAMIN E PO Take 1 tablet by mouth daily.  No current facility-administered medications for this visit.   Facility-Administered Medications Ordered in Other Visits  Medication Dose Route Frequency Provider Last Rate Last Admin  . heparin lock flush 100 unit/mL  500 Units Intracatheter Once PRN Magrinat, Virgie Dad, MD      . sodium chloride flush (NS) 0.9 % injection 10 mL  10 mL Intracatheter PRN Magrinat, Virgie Dad, MD        SURGICAL HISTORY:  Past Surgical History:  Procedure Laterality Date  . CATARACT EXTRACTION W/ INTRAOCULAR LENS  IMPLANT, BILATERAL  2012  . DECOMPRESSIVE LUMBAR LAMINECTOMY LEVEL 2 N/A 07/29/2013   Procedure: LUMBAR LAMINECTOMY,  DECOMPRESSION LUMBAR THREE TO FOUR, FOUR TO FIVE microdiscectomy l3,4 right;  Surgeon: Tobi Bastos, MD;  Location: [facility];  Service: Orthopedics;  Laterality: N/A;  . I & D SUPERIOR RIGHT SHOULDER AND CLOSURE WOUND  01-10-2011   S/P ROTATOR CUFF REPAIR  . IR IMAGING GUIDED PORT INSERTION  04/09/2019  . LUMBAR LAMINECTOMY  1970'S  . LUMBAR LAMINECTOMY/DECOMPRESSION MICRODISCECTOMY N/A 06/27/2016   Procedure: L1 - L2 DISCECTOMY;  Surgeon: Melina Schools, MD;  Location: [facility];  Service: Orthopedics;  Laterality: N/A;  . [place]  . REVERSE SHOULDER ARTHROPLASTY Left 03/10/2020   Procedure: REVERSE SHOULDER ARTHROPLASTY;  Surgeon: Justice Britain, MD;  Location: [facility];  Service: Orthopedics;  Laterality: Left;  171min  . RIGHT SHOULDER ARTHROSCOPY/ OPEN DISTAL CLAVICLE RESECTION/ SAD/ OPEN ROTATOR CUFF REPAIR  11-28-2010  . SHOULDER FUSION SURGERY  03/11/2020   Shoulder Surgery , hardware placed  . SHOULDER OPEN ROTATOR CUFF REPAIR  12/19/2011   Procedure: ROTATOR CUFF REPAIR SHOULDER OPEN;  Surgeon: Magnus Sinning, MD;  Location: [facility];  Service: Orthopedics;  Laterality: Right;  RIGHT RECURRENT OPEN REPAIR OF THE ROTATOR CUFF WITH TISSUE MEND GRAFTANTERIOR CHROMIOECTOMY  . VAGINAL HYSTERECTOMY  1979    REVIEW OF SYSTEMS:  A comprehensive review of systems was negative except for: Constitutional: positive for fatigue Musculoskeletal: positive for bone pain   PHYSICAL EXAMINATION: General appearance: alert, cooperative, fatigued and no distress Head: Normocephalic, without obvious abnormality, atraumatic Neck: no adenopathy, no JVD, supple, symmetrical, trachea midline and thyroid not enlarged, symmetric, no tenderness/mass/nodules Lymph nodes: Cervical, supraclavicular, and axillary nodes normal. Resp: clear to auscultation bilaterally Back: symmetric, no curvature. ROM normal. No CVA tenderness. Cardio: regular rate and rhythm, S1, S2 normal, no murmur,  click, rub or gallop GI: soft, non-tender; bowel sounds normal; no masses,  no organomegaly Extremities: extremities normal, atraumatic, no cyanosis or edema   ECOG PERFORMANCE STATUS: 1 - Symptomatic but completely ambulatory  Blood pressure (!) 160/69, pulse 73, temperature 97.6 F (36.4 C), temperature source Tympanic, resp. rate 18, height 5\' 2"  (1.575 m), weight 141 lb 11.2 oz (64.3 kg), SpO2 97 %.  LABORATORY DATA: Lab Results  Component Value Date   WBC 7.8 01/23/2021   HGB 11.9 (L) 01/23/2021   HCT 34.3 (L) 01/23/2021   MCV 93.2 01/23/2021   PLT 228 01/23/2021      Chemistry      Component Value Date/Time   NA 132 (L) 12/26/2020 1350   K 3.8 12/26/2020 1350   CL 104 12/26/2020 1350   CO2 23 12/26/2020 1350   BUN 18 12/26/2020 1350   CREATININE 0.70 12/26/2020 1350   CREATININE 0.76 12/21/2019 1100      Component Value Date/Time   CALCIUM 9.0 12/26/2020 1350   ALKPHOS 76 12/26/2020 1350   AST 24 12/26/2020 1350  AST 22 12/21/2019 1100   ALT 16 12/26/2020 1350   ALT 13 12/21/2019 1100   BILITOT 0.4 12/26/2020 1350   BILITOT 0.3 12/21/2019 1100       RADIOGRAPHIC STUDIES: No results found.  ASSESSMENT AND PLAN: This is a very pleasant 80 years old white female with extensive stage small cell lung cancer and she is currently undergoing treatment with carboplatin, etoposide and Imfinzi status post 5 cycles.  Starting from cycle #6 the patient is on maintenance treatment with single agent Imfinzi every 4 weeks status post 25 cycles of total treatment. The patient continues to tolerate this treatment well with no concerning adverse effects. I recommended for her to proceed with cycle #26 today as planned. I will see her back for follow-up visit in 4 weeks for evaluation with repeat CT scan of the chest, abdomen and pelvis for restaging of her disease. The patient was advised to call immediately if she has any concerning symptoms in the interval. The patient voices  understanding of current disease status and treatment options and is in agreement with the current care plan.  All questions were answered. The patient knows to call the clinic with any problems, questions or concerns. We can certainly see the patient much sooner if necessary.  Disclaimer: This note was dictated with voice recognition software. Similar sounding words can inadvertently be transcribed and may not be corrected upon review.

## 2021-01-23 NOTE — Progress Notes (Signed)
I was able to connect with Brittany Marks today.  She is getting systemic therapy for small cell lung cancer.  She is doing well without complaints.  She did talk about her daughter has moved but a neighbor is able to bring her to her appts.  It was great meeting her today.

## 2021-01-31 ENCOUNTER — Telehealth: Payer: Self-pay | Admitting: Internal Medicine

## 2021-01-31 NOTE — Telephone Encounter (Signed)
R/s appts per 4/25 sch msg. Pt aware.

## 2021-02-08 ENCOUNTER — Telehealth: Payer: Self-pay | Admitting: Internal Medicine

## 2021-02-08 NOTE — Telephone Encounter (Signed)
Sch per 2/21 los, Spoke with patient daughter, Was out of town unavailable to [employer], Calendar mailed

## 2021-02-17 ENCOUNTER — Ambulatory Visit (HOSPITAL_COMMUNITY)
Admission: RE | Admit: 2021-02-17 | Discharge: 2021-02-17 | Disposition: A | Discharge status: Home / Self Care | Payer: Federal, State, Local not specified - PPO | Source: Ambulatory Visit | Attending: Internal Medicine | Admitting: Internal Medicine

## 2021-02-17 ENCOUNTER — Other Ambulatory Visit: Payer: Self-pay

## 2021-02-17 DIAGNOSIS — C3490 Malignant neoplasm of unspecified part of unspecified bronchus or lung: Secondary | ICD-10-CM | POA: Diagnosis present

## 2021-02-17 MED ORDER — SODIUM CHLORIDE (PF) 0.9 % IJ SOLN
INTRAMUSCULAR | Status: AC
  Filled 2021-02-17: qty 50

## 2021-02-17 MED ORDER — HEPARIN SOD (PORK) LOCK FLUSH 100 UNIT/ML IV SOLN
500.0000 [IU] | Freq: Once | INTRAVENOUS | Status: AC
  Administered 2021-02-17: 500 [IU] via INTRAVENOUS

## 2021-02-17 MED ORDER — IOHEXOL 300 MG/ML  SOLN
75.0000 mL | Freq: Once | INTRAMUSCULAR | Status: AC | PRN
  Administered 2021-02-17: 75 mL via INTRAVENOUS

## 2021-02-20 ENCOUNTER — Encounter: Payer: Self-pay | Admitting: Internal Medicine

## 2021-02-20 ENCOUNTER — Other Ambulatory Visit: Payer: Self-pay

## 2021-02-20 ENCOUNTER — Inpatient Hospital Stay: Payer: Federal, State, Local not specified - PPO

## 2021-02-20 ENCOUNTER — Inpatient Hospital Stay: Payer: Federal, State, Local not specified - PPO | Attending: Oncology

## 2021-02-20 ENCOUNTER — Inpatient Hospital Stay (HOSPITAL_BASED_OUTPATIENT_CLINIC_OR_DEPARTMENT_OTHER): Payer: Federal, State, Local not specified - PPO

## 2021-02-20 ENCOUNTER — Inpatient Hospital Stay (HOSPITAL_BASED_OUTPATIENT_CLINIC_OR_DEPARTMENT_OTHER): Payer: Federal, State, Local not specified - PPO | Admitting: Internal Medicine

## 2021-02-20 VITALS — BP 150/60 | HR 72 | Temp 97.4°F | Resp 19 | Ht 62.0 in | Wt 140.0 lb

## 2021-02-20 DIAGNOSIS — Z95828 Presence of other vascular implants and grafts: Secondary | ICD-10-CM

## 2021-02-20 DIAGNOSIS — Z5112 Encounter for antineoplastic immunotherapy: Secondary | ICD-10-CM | POA: Diagnosis not present

## 2021-02-20 DIAGNOSIS — Z79899 Other long term (current) drug therapy: Secondary | ICD-10-CM | POA: Diagnosis not present

## 2021-02-20 DIAGNOSIS — Z23 Encounter for immunization: Secondary | ICD-10-CM | POA: Diagnosis not present

## 2021-02-20 DIAGNOSIS — C3411 Malignant neoplasm of upper lobe, right bronchus or lung: Secondary | ICD-10-CM | POA: Insufficient documentation

## 2021-02-20 DIAGNOSIS — R197 Diarrhea, unspecified: Secondary | ICD-10-CM | POA: Insufficient documentation

## 2021-02-20 DIAGNOSIS — K5900 Constipation, unspecified: Secondary | ICD-10-CM | POA: Insufficient documentation

## 2021-02-20 DIAGNOSIS — Z1211 Encounter for screening for malignant neoplasm of colon: Secondary | ICD-10-CM

## 2021-02-20 DIAGNOSIS — R5383 Other fatigue: Secondary | ICD-10-CM | POA: Insufficient documentation

## 2021-02-20 DIAGNOSIS — R937 Abnormal findings on diagnostic imaging of other parts of musculoskeletal system: Secondary | ICD-10-CM

## 2021-02-20 LAB — CBC WITH DIFFERENTIAL/PLATELET
Abs Immature Granulocytes: 0.02 10*3/uL (ref 0.00–0.07)
Basophils Absolute: 0 10*3/uL (ref 0.0–0.1)
Basophils Relative: 1 %
Eosinophils Absolute: 0.3 10*3/uL (ref 0.0–0.5)
Eosinophils Relative: 4 %
HCT: 35 % — ABNORMAL LOW (ref 36.0–46.0)
Hemoglobin: 11.8 g/dL — ABNORMAL LOW (ref 12.0–15.0)
Immature Granulocytes: 0 %
Lymphocytes Relative: 19 %
Lymphs Abs: 1.5 10*3/uL (ref 0.7–4.0)
MCH: 31.9 pg (ref 26.0–34.0)
MCHC: 33.7 g/dL (ref 30.0–36.0)
MCV: 94.6 fL (ref 80.0–100.0)
Monocytes Absolute: 0.7 10*3/uL (ref 0.1–1.0)
Monocytes Relative: 9 %
Neutro Abs: 5.4 10*3/uL (ref 1.7–7.7)
Neutrophils Relative %: 67 %
Platelets: 219 10*3/uL (ref 150–400)
RBC: 3.7 MIL/uL — ABNORMAL LOW (ref 3.87–5.11)
RDW: 13 % (ref 11.5–15.5)
WBC: 7.9 10*3/uL (ref 4.0–10.5)
nRBC: 0 % (ref 0.0–0.2)

## 2021-02-20 LAB — COMPREHENSIVE METABOLIC PANEL
ALT: 13 U/L (ref 0–44)
AST: 23 U/L (ref 15–41)
Albumin: 3.6 g/dL (ref 3.5–5.0)
Alkaline Phosphatase: 77 U/L (ref 38–126)
Anion gap: 5 (ref 5–15)
BUN: 20 mg/dL (ref 8–23)
CO2: 26 mmol/L (ref 22–32)
Calcium: 8.8 mg/dL — ABNORMAL LOW (ref 8.9–10.3)
Chloride: 106 mmol/L (ref 98–111)
Creatinine, Ser: 0.75 mg/dL (ref 0.44–1.00)
GFR, Estimated: >60 mL/min (ref >60)
Glucose, Bld: 127 mg/dL — ABNORMAL HIGH (ref 70–99)
Potassium: 4.1 mmol/L (ref 3.5–5.1)
Sodium: 137 mmol/L (ref 135–145)
Total Bilirubin: 0.4 mg/dL (ref 0.3–1.2)
Total Protein: 6.9 g/dL (ref 6.5–8.1)

## 2021-02-20 MED ORDER — SODIUM CHLORIDE 0.9% FLUSH
10.0000 mL | INTRAVENOUS | Status: DC | PRN
  Administered 2021-02-20: 10 mL
  Filled 2021-02-20: qty 10

## 2021-02-20 MED ORDER — SODIUM CHLORIDE 0.9 % IV SOLN
Freq: Once | INTRAVENOUS | Status: AC
Start: 2021-02-20 — End: 2021-02-20
  Filled 2021-02-20: qty 250

## 2021-02-20 MED ORDER — SODIUM CHLORIDE 0.9 % IV SOLN
1500.0000 mg | Freq: Once | INTRAVENOUS | Status: AC
  Administered 2021-02-20: 1500 mg via INTRAVENOUS
  Filled 2021-02-20: qty 30

## 2021-02-20 MED ORDER — HEPARIN SOD (PORK) LOCK FLUSH 100 UNIT/ML IV SOLN
500.0000 [IU] | Freq: Once | INTRAVENOUS | Status: AC | PRN
Start: 2021-02-20 — End: 2021-02-20
  Administered 2021-02-20: 500 [IU]
  Filled 2021-02-20: qty 5

## 2021-02-20 NOTE — Progress Notes (Signed)
   Covid-19 Vaccination Clinic  Name:  Brittany Marks    MRN: [id number] DOB: Aug 17, 1941  02/20/2021  [name] was observed post Covid-19 immunization for 15 minutes without incident. She was provided with Vaccine Information Sheet and instruction to access the V-Safe system.   [name] was instructed to call 911 with any severe reactions post vaccine: Marland Kitchen Difficulty breathing  . Swelling of face and throat  . A fast heartbeat  . A bad rash all over body  . Dizziness and weakness   Immunizations Administered    Name Date Dose VIS Date Route   PFIZER Comrnaty(Gray TOP) Covid-19 Vaccine 02/20/2021  2:13 PM 0.3 mL 09/15/2020 Intramuscular   Manufacturer: [employer]   Lot: [id number]   [place]: [phone]

## 2021-02-20 NOTE — Patient Instructions (Signed)
[facility]   Discharge Instructions: Thank you for choosing [facility] to provide your oncology and hematology care.   If you have a lab appointment with the Cancer Center, please go directly to the Cancer Center and check in at the registration area.   Wear comfortable clothing and clothing appropriate for easy access to any Portacath or PICC line.   We strive to give you quality time with your provider. You may need to reschedule your appointment if you arrive late (15 or more minutes).  Arriving late affects you and other patients whose appointments are after yours.  Also, if you miss three or more appointments without notifying the office, you may be dismissed from the clinic at the provider's discretion.      For prescription refill requests, have your pharmacy contact our office and allow 72 hours for refills to be completed.    Today you received the following chemotherapy and/or immunotherapy agents: durvalumab.      To help prevent nausea and vomiting after your treatment, we encourage you to take your nausea medication as directed.  BELOW ARE SYMPTOMS THAT SHOULD BE REPORTED IMMEDIATELY: *FEVER GREATER THAN 100.4 F (38 C) OR HIGHER *CHILLS OR SWEATING *NAUSEA AND VOMITING THAT IS NOT CONTROLLED WITH YOUR NAUSEA MEDICATION *UNUSUAL SHORTNESS OF BREATH *UNUSUAL BRUISING OR BLEEDING *URINARY PROBLEMS (pain or burning when urinating, or frequent urination) *BOWEL PROBLEMS (unusual diarrhea, constipation, pain near the anus) TENDERNESS IN MOUTH AND THROAT WITH OR WITHOUT PRESENCE OF ULCERS (sore throat, sores in mouth, or a toothache) UNUSUAL RASH, SWELLING OR PAIN  UNUSUAL VAGINAL DISCHARGE OR ITCHING   Items with * indicate a potential emergency and should be followed up as soon as possible or go to the Emergency Department if any problems should occur.  Please show the CHEMOTHERAPY ALERT CARD or IMMUNOTHERAPY ALERT CARD at check-in  to the Emergency Department and triage nurse.  Should you have questions after your visit or need to cancel or reschedule your appointment, please contact [facility]  Dept: [phone]  and follow the prompts.  Office hours are 8:00 a.m. to 4:30 p.m. Monday - Friday. Please note that voicemails left after 4:00 p.m. may not be returned until the following business day.  We are closed weekends and major holidays. You have access to a nurse at all times for urgent questions. Please call the main number to the clinic Dept: [phone] and follow the prompts.   For any non-urgent questions, you may also contact your provider using MyChart. We now offer e-Visits for anyone 18 and older to request care online for non-urgent symptoms. For details visit [email or web address].   Also download the MyChart app! Go to the app store, search "MyChart", open the app, select , and log in with your MyChart username and password.  Due to Covid, a mask is required upon entering the hospital/clinic. If you do not have a mask, one will be given to you upon arrival. For doctor visits, patients may have 1 support person aged 18 or older with them. For treatment visits, patients cannot have anyone with them due to current Covid guidelines and our immunocompromised population.   

## 2021-02-20 NOTE — Progress Notes (Signed)
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  PRIOR THERAPY: None  CURRENT THERAPY: Palliative systemic chemotherapy with carboplatin for AUC of 5 on day 1, etoposide 100 mg/M2 on days 1, 2 and 3 as well as Imfinzi 1500 mg every 3 weeks with the chemotherapy and Neulasta support. First dose on 03/16/2019. Status post 26 cycles.   Starting from cycle #5 the patient will be treated with maintenance treatment with Imfinzi 1500 mg IV every 4 weeks.  INTERVAL HISTORY: Brittany Marks 80 y.o. female returns to the clinic today for follow-up visit.  The patient has no complaints today except for diarrhea from the oral contrast after the CT scan.  She denied having any current chest pain, shortness of breath, cough or hemoptysis.  She has no nausea, vomiting, abdominal pain or constipation.  She has no recent weight loss or night sweats.  She has no headache or visual changes.  She has been tolerating her treatment with maintenance Imfinzi fairly well.  She had repeat CT scan of the chest, abdomen pelvis performed recently and she is here for evaluation and discussion of her scan results.  MEDICAL HISTORY: Past Medical History:  Diagnosis Date  . Cataract    Bilateral  . Constipation    pain medication  . Left rotator cuff tear   . Right rotator cuff tear   . SCL CA dx'd 11/2018    ALLERGIES:  has No Known Allergies.  MEDICATIONS:  Current Outpatient Medications  Medication Sig Dispense Refill  . Ascorbic Acid (VITAMIN C PO) Take by mouth daily.     Marland Kitchen b complex vitamins tablet Take 1 tablet by mouth daily.     . COD LIVER OIL PO Take 1 tablet by mouth daily.     . cyclobenzaprine (FLEXERIL) 10 MG tablet Take 1 tablet (10 mg total) by mouth 3 (three) times daily as needed for muscle spasms. 30 tablet 1  . HYDROcodone-acetaminophen (NORCO) 5-325 MG tablet Take 1 tablet by mouth every 6 (six) hours as needed for moderate pain. 20 tablet 0  . hydrocortisone 2.5 % cream Apply topically as needed. (Patient taking differently: Apply 1 application topically daily as needed (Treatment). ) 3.5 g 0  . ibuprofen (ADVIL) 200 MG tablet Take 600 mg by mouth daily.    Marland Kitchen lidocaine-prilocaine (EMLA) cream Apply 1 application topically every 30 (thirty) days.     . Multiple Vitamin (MULTIVITAMIN) tablet Take 1 tablet by mouth daily.    . ondansetron (ZOFRAN) 4 MG tablet Take 1 tablet (4 mg total) by mouth every 8 (eight) hours as needed for nausea or vomiting. 10 tablet 0  . oxymetazoline (AFRIN) 0.05 % nasal spray Place 2 sprays into the nose 2 (two) times daily as needed (Bloody nose).     Marland Kitchen prochlorperazine (COMPAZINE) 10 MG tablet Take 1 tablet (10 mg total) by mouth every 6 (six) hours as needed for nausea or vomiting. 30 tablet 0  . silver sulfADIAZINE (SILVADENE) 1 % cream Apply 1 application topically 2 (two) times daily. 50 g 1  . sodium chloride (OCEAN) 0.65 % nasal spray Place 1 spray into the nose  at bedtime.     Marland Kitchen VITAMIN E PO Take 1 tablet by mouth daily.      No current facility-administered medications for this visit.   Facility-Administered Medications Ordered in Other Visits  Medication Dose Route Frequency Provider Last Rate Last Admin  . heparin lock flush 100 unit/mL  500 Units Intracatheter Once PRN Magrinat, Virgie Dad, MD      . sodium chloride flush (NS) 0.9 % injection 10 mL  10 mL Intracatheter PRN Magrinat, Virgie Dad, MD        SURGICAL HISTORY:  Past Surgical History:  Procedure Laterality Date  . CATARACT EXTRACTION W/ INTRAOCULAR LENS  IMPLANT, BILATERAL  2012  . DECOMPRESSIVE LUMBAR  LAMINECTOMY LEVEL 2 N/A 07/29/2013   Procedure: LUMBAR LAMINECTOMY, DECOMPRESSION LUMBAR THREE TO FOUR, FOUR TO FIVE microdiscectomy l3,4 right;  Surgeon: Tobi Bastos, MD;  Location: [facility];  Service: Orthopedics;  Laterality: N/A;  . I & D SUPERIOR RIGHT SHOULDER AND CLOSURE WOUND  01-10-2011   S/P ROTATOR CUFF REPAIR  . IR IMAGING GUIDED PORT INSERTION  04/09/2019  . LUMBAR LAMINECTOMY  1970'S  . LUMBAR LAMINECTOMY/DECOMPRESSION MICRODISCECTOMY N/A 06/27/2016   Procedure: L1 - L2 DISCECTOMY;  Surgeon: Melina Schools, MD;  Location: [facility];  Service: Orthopedics;  Laterality: N/A;  . [place]  . REVERSE SHOULDER ARTHROPLASTY Left 03/10/2020   Procedure: REVERSE SHOULDER ARTHROPLASTY;  Surgeon: Justice Britain, MD;  Location: [facility];  Service: Orthopedics;  Laterality: Left;  146min  . RIGHT SHOULDER ARTHROSCOPY/ OPEN DISTAL CLAVICLE RESECTION/ SAD/ OPEN ROTATOR CUFF REPAIR  11-28-2010  . SHOULDER FUSION SURGERY  03/11/2020   Shoulder Surgery , hardware placed  . SHOULDER OPEN ROTATOR CUFF REPAIR  12/19/2011   Procedure: ROTATOR CUFF REPAIR SHOULDER OPEN;  Surgeon: Magnus Sinning, MD;  Location: [facility];  Service: Orthopedics;  Laterality: Right;  RIGHT RECURRENT OPEN REPAIR OF THE ROTATOR CUFF WITH TISSUE MEND GRAFTANTERIOR CHROMIOECTOMY  . VAGINAL HYSTERECTOMY  1979    REVIEW OF SYSTEMS:  Constitutional: positive for fatigue Eyes: negative Ears, nose, mouth, throat, and face: negative Respiratory: negative Cardiovascular: negative Gastrointestinal: positive for diarrhea Genitourinary:negative Integument/breast: negative Hematologic/lymphatic: negative Musculoskeletal:negative Neurological: negative Behavioral/Psych: negative Endocrine: negative Allergic/Immunologic: negative   PHYSICAL EXAMINATION: General appearance: alert, cooperative, fatigued and no distress Head: Normocephalic, without obvious abnormality, atraumatic Neck: no adenopathy,  no JVD, supple, symmetrical, trachea midline and thyroid not enlarged, symmetric, no tenderness/mass/nodules Lymph nodes: Cervical, supraclavicular, and axillary nodes normal. Resp: clear to auscultation bilaterally Back: symmetric, no curvature. ROM normal. No CVA tenderness. Cardio: regular rate and rhythm, S1, S2 normal, no murmur, click, rub or gallop GI: soft, non-tender; bowel sounds normal; no masses,  no organomegaly Extremities: extremities normal, atraumatic, no cyanosis or edema Neurologic: Alert and oriented X 3, normal strength and tone. Normal symmetric reflexes. Normal coordination and gait   ECOG PERFORMANCE STATUS: 1 - Symptomatic but completely ambulatory  Blood pressure (!) 150/60, pulse 72, temperature (!) 97.4 F (36.3 C), temperature source Tympanic, resp. rate 19, height 5\' 2"  (1.575 m), weight 140 lb (63.5 kg), SpO2 97 %.  LABORATORY DATA: Lab Results  Component Value Date   WBC 7.9 02/20/2021   HGB 11.8 (L) 02/20/2021   HCT 35.0 (L) 02/20/2021   MCV 94.6 02/20/2021   PLT 219 02/20/2021      Chemistry      Component Value Date/Time   NA 136 01/23/2021 1330   K 4.0 01/23/2021 1330   CL  104 01/23/2021 1330   CO2 24 01/23/2021 1330   BUN 16 01/23/2021 1330   CREATININE 0.74 01/23/2021 1330   CREATININE 0.76 12/21/2019 1100      Component Value Date/Time   CALCIUM 8.7 (L) 01/23/2021 1330   ALKPHOS 77 01/23/2021 1330   AST 22 01/23/2021 1330   AST 22 12/21/2019 1100   ALT 12 01/23/2021 1330   ALT 13 12/21/2019 1100   BILITOT 0.4 01/23/2021 1330   BILITOT 0.3 12/21/2019 1100       RADIOGRAPHIC STUDIES: CT Chest W Contrast  Result Date: 02/20/2021 CLINICAL DATA:  Follow-up small cell lung carcinoma.  Restaging. EXAM: CT CHEST, ABDOMEN, AND PELVIS WITH CONTRAST TECHNIQUE: Multidetector CT imaging of the chest, abdomen and pelvis was performed following the standard protocol during bolus administration of intravenous contrast. CONTRAST:  82mL  OMNIPAQUE IOHEXOL 300 MG/ML  SOLN COMPARISON:  11/24/2020 FINDINGS: CT CHEST FINDINGS Cardiovascular: No acute findings. Mediastinum/Lymph Nodes: Mild mediastinal lymphadenopathy is again seen in the pretracheal and right paratracheal regions, without significant change. Largest index lymph node in the right paratracheal region measures 1.7 cm on image 24/2. No new or increased sites of lymphadenopathy identified. Lungs/Pleura: No suspicious pulmonary nodules or masses identified. Stable appearance of chronic interstitial lung disease with subpleural honeycombing consistent with fibrosis. Pleural-parenchymal scarring in both lungs is also stable in appearance. No acute pulmonary infiltrate or pleural effusion identified. Musculoskeletal: No suspicious bone lesions identified. Left shoulder prosthesis again seen as well as end-stage right glenohumeral osteoarthritis. CT ABDOMEN AND PELVIS FINDINGS Hepatobiliary: No masses identified. Gallbladder is unremarkable. No evidence of biliary ductal dilatation. Pancreas:  No mass or inflammatory changes. Spleen:  Within normal limits in size and appearance. Adrenals/Urinary tract: Normal adrenal glands. No renal masses identified. Severe right renal pelvicaliectasis is stable and there is no evidence of ureteral dilatation or urolithiasis. Stomach/Bowel: Small hiatal hernia remains stable. No evidence of obstruction, inflammatory process, or abnormal fluid collections. Normal appendix visualized. Vascular/Lymphatic: No pathologically enlarged lymph nodes identified. No acute vascular findings. Aortic atherosclerotic calcification noted. Reproductive: Prior hysterectomy noted. Adnexal regions are unremarkable in appearance. Other:  None. Musculoskeletal:  No suspicious bone lesions identified. IMPRESSION: Stable mild mediastinal lymphadenopathy. No new or progressive disease within the chest, abdomen, or pelvis. 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 Stable severe right renal pelvicaliectasis. No evidence of ureteral dilatation or urolithiasis. Congenital partial UPJ obstruction cannot be excluded. Stable small hiatal hernia. Aortic Atherosclerosis (ICD10-I70.0). Electronically Signed   By: Marlaine Hind M.D.   On: 02/20/2021 10:36   CT Abdomen Pelvis W Contrast  Result Date: 02/20/2021 CLINICAL DATA:  Follow-up small cell lung carcinoma.  Restaging. EXAM: CT CHEST, ABDOMEN, AND PELVIS WITH CONTRAST TECHNIQUE: Multidetector CT imaging of the chest, abdomen and pelvis was performed following the standard protocol during bolus administration of intravenous contrast. CONTRAST:  2mL OMNIPAQUE IOHEXOL 300 MG/ML  SOLN COMPARISON:  11/24/2020 FINDINGS: CT CHEST FINDINGS Cardiovascular: No acute findings. Mediastinum/Lymph Nodes: Mild mediastinal lymphadenopathy is again seen in the pretracheal and right paratracheal regions, without significant change. Largest index lymph node in the right paratracheal region measures 1.7 cm on image 24/2. No new or increased sites of lymphadenopathy identified. Lungs/Pleura: No suspicious pulmonary nodules or masses identified. Stable appearance of chronic interstitial lung disease with subpleural honeycombing consistent with fibrosis. Pleural-parenchymal scarring in both lungs is also stable in appearance. No acute pulmonary infiltrate or pleural effusion identified. Musculoskeletal: No suspicious bone lesions identified. Left shoulder prosthesis again seen as  well as end-stage right glenohumeral osteoarthritis. CT ABDOMEN AND PELVIS FINDINGS Hepatobiliary: No masses identified. Gallbladder is unremarkable. No evidence of biliary ductal dilatation. Pancreas:  No mass or inflammatory changes. Spleen:  Within normal limits in size and appearance. Adrenals/Urinary tract: Normal adrenal glands. No renal masses identified. Severe right renal pelvicaliectasis is stable and there is no evidence of ureteral dilatation or  urolithiasis. Stomach/Bowel: Small hiatal hernia remains stable. No evidence of obstruction, inflammatory process, or abnormal fluid collections. Normal appendix visualized. Vascular/Lymphatic: No pathologically enlarged lymph nodes identified. No acute vascular findings. Aortic atherosclerotic calcification noted. Reproductive: Prior hysterectomy noted. Adnexal regions are unremarkable in appearance. Other:  None. Musculoskeletal:  No suspicious bone lesions identified. IMPRESSION: Stable mild mediastinal lymphadenopathy. No new or progressive disease within the chest, abdomen, or pelvis. 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 Stable severe right renal pelvicaliectasis. No evidence of ureteral dilatation or urolithiasis. Congenital partial UPJ obstruction cannot be excluded. Stable small hiatal hernia. Aortic Atherosclerosis (ICD10-I70.0). Electronically Signed   By: Marlaine Hind M.D.   On: 02/20/2021 10:36    ASSESSMENT AND PLAN: This is a very pleasant 80 years old white female with extensive stage small cell lung cancer and she is currently undergoing treatment with carboplatin, etoposide and Imfinzi status post 5 cycles.  Starting from cycle #6 the patient is on maintenance treatment with single agent Imfinzi every 4 weeks status post 26 cycles of total treatment. The patient has been tolerating her treatment fairly well with no concerning adverse effects. She had repeat CT scan of the chest, abdomen pelvis performed recently.  I personally and independently reviewed the scans and discussed the results with the patient today. Her scan showed no concerning findings for disease progression. I recommended for her to continue her current treatment with maintenance Imfinzi and she will proceed with cycle #27 today as planned. I will see the patient back for follow-up visit in 4 weeks for evaluation before the next cycle of her treatment. She was advised to call immediately if she  has any concerning symptoms in the interval. The patient voices understanding of current disease status and treatment options and is in agreement with the current care plan.  All questions were answered. The patient knows to call the clinic with any problems, questions or concerns. We can certainly see the patient much sooner if necessary.  Disclaimer: This note was dictated with voice recognition software. Similar sounding words can inadvertently be transcribed and may not be corrected upon review.

## 2021-02-20 NOTE — Patient Instructions (Signed)
Steps to Quit Smoking Smoking tobacco is the leading cause of preventable death. It can affect almost every organ in the body. Smoking puts you and people around you at risk for many serious, long-lasting (chronic) diseases. Quitting smoking can be hard, but it is one of the best things that you can do for your health. It is never too late to quit. How do I get ready to quit? When you decide to quit smoking, make a plan to help you succeed. Before you quit:  Pick a date to quit. Set a date within the next 2 weeks to give you time to prepare.  Write down the reasons why you are quitting. Keep this list in places where you will see it often.  Tell your family, friends, and co-workers that you are quitting. Their support is important.  Talk with your doctor about the choices that may help you quit.  Find out if your health insurance will pay for these treatments.  Know the people, places, things, and activities that make you want to smoke (triggers). Avoid them. What first steps can I take to quit smoking?  Throw away all cigarettes at home, at work, and in your car.  Throw away the things that you use when you smoke, such as ashtrays and lighters.  Clean your car. Make sure to empty the ashtray.  Clean your home, including curtains and carpets. What can I do to help me quit smoking? Talk with your doctor about taking medicines and seeing a counselor at the same time. You are more likely to succeed when you do both.  If you are pregnant or breastfeeding, talk with your doctor about counseling or other ways to quit smoking. Do not take medicine to help you quit smoking unless your doctor tells you to do so. To quit smoking: Quit right away  Quit smoking totally, instead of slowly cutting back on how much you smoke over a period of time.  Go to counseling. You are more likely to quit if you go to counseling sessions regularly. Take medicine You may take medicines to help you quit. Some  medicines need a prescription, and some you can buy over-the-counter. Some medicines may contain a drug called nicotine to replace the nicotine in cigarettes. Medicines may:  Help you to stop having the desire to smoke (cravings).  Help to stop the problems that come when you stop smoking (withdrawal symptoms). Your doctor may ask you to use:  Nicotine patches, gum, or lozenges.  Nicotine inhalers or sprays.  Non-nicotine medicine that is taken by mouth. Find resources Find resources and other ways to help you quit smoking and remain smoke-free after you quit. These resources are most helpful when you use them often. They include:  Online chats with a counselor.  Phone quitlines.  Printed self-help materials.  Support groups or group counseling.  Text messaging programs.  Mobile phone apps. Use apps on your mobile phone or tablet that can help you stick to your quit plan. There are many free apps for mobile phones and tablets as well as websites. Examples include Quit Guide from the CDC and smokefree.gov   What things can I do to make it easier to quit?  Talk to your family and friends. Ask them to support and encourage you.  Call a phone quitline (1-800-QUIT-NOW), reach out to support groups, or work with a counselor.  Ask people who smoke to not smoke around you.  Avoid places that make you want to smoke,   such as: ? Bars. ? Parties. ? Smoke-break areas at work.  Spend time with people who do not smoke.  Lower the stress in your life. Stress can make you want to smoke. Try these things to help your stress: ? Getting regular exercise. ? Doing deep-breathing exercises. ? Doing yoga. ? Meditating. ? Doing a body scan. To do this, close your eyes, focus on one area of your body at a time from head to toe. Notice which parts of your body are tense. Try to relax the muscles in those areas.   How will I feel when I quit smoking? Day 1 to 3 weeks Within the first 24 hours,  you may start to have some problems that come from quitting tobacco. These problems are very bad 2-3 days after you quit, but they do not often last for more than 2-3 weeks. You may get these symptoms:  Mood swings.  Feeling restless, nervous, angry, or annoyed.  Trouble concentrating.  Dizziness.  Strong desire for high-sugar foods and nicotine.  Weight gain.  Trouble pooping (constipation).  Feeling like you may vomit (nausea).  Coughing or a sore throat.  Changes in how the medicines that you take for other issues work in your body.  Depression.  Trouble sleeping (insomnia). Week 3 and afterward After the first 2-3 weeks of quitting, you may start to notice more positive results, such as:  Better sense of smell and taste.  Less coughing and sore throat.  Slower heart rate.  Lower blood pressure.  Clearer skin.  Better breathing.  Fewer sick days. Quitting smoking can be hard. Do not give up if you fail the first time. Some people need to try a few times before they succeed. Do your best to stick to your quit plan, and talk with your doctor if you have any questions or concerns. Summary  Smoking tobacco is the leading cause of preventable death. Quitting smoking can be hard, but it is one of the best things that you can do for your health.  When you decide to quit smoking, make a plan to help you succeed.  Quit smoking right away, not slowly over a period of time.  When you start quitting, seek help from your doctor, family, or friends. This information is not intended to replace advice given to you by your health care provider. Make sure you discuss any questions you have with your health care provider. Document Revised: 06/19/2019 Document Reviewed: 12/13/2018 Elsevier Patient Education  2021 [employer]  

## 2021-03-13 MED ORDER — ONDANSETRON HCL 4 MG/2ML IJ SOLN
INTRAMUSCULAR | Status: AC
  Filled 2021-03-13: qty 2

## 2021-03-20 ENCOUNTER — Encounter: Payer: Self-pay | Admitting: Internal Medicine

## 2021-03-20 ENCOUNTER — Inpatient Hospital Stay (HOSPITAL_BASED_OUTPATIENT_CLINIC_OR_DEPARTMENT_OTHER): Payer: Federal, State, Local not specified - PPO | Admitting: Internal Medicine

## 2021-03-20 ENCOUNTER — Ambulatory Visit: Payer: Federal, State, Local not specified - PPO

## 2021-03-20 ENCOUNTER — Inpatient Hospital Stay: Payer: Federal, State, Local not specified - PPO

## 2021-03-20 ENCOUNTER — Other Ambulatory Visit: Payer: Federal, State, Local not specified - PPO

## 2021-03-20 ENCOUNTER — Other Ambulatory Visit: Payer: Self-pay

## 2021-03-20 ENCOUNTER — Encounter: Payer: Self-pay | Admitting: Oncology

## 2021-03-20 ENCOUNTER — Inpatient Hospital Stay: Payer: Federal, State, Local not specified - PPO | Attending: Oncology

## 2021-03-20 ENCOUNTER — Ambulatory Visit: Payer: Federal, State, Local not specified - PPO | Admitting: Internal Medicine

## 2021-03-20 VITALS — BP 149/60 | HR 73 | Temp 97.6°F | Resp 19 | Ht 62.0 in | Wt 139.6 lb

## 2021-03-20 DIAGNOSIS — G629 Polyneuropathy, unspecified: Secondary | ICD-10-CM | POA: Diagnosis not present

## 2021-03-20 DIAGNOSIS — C3411 Malignant neoplasm of upper lobe, right bronchus or lung: Secondary | ICD-10-CM

## 2021-03-20 DIAGNOSIS — Z1211 Encounter for screening for malignant neoplasm of colon: Secondary | ICD-10-CM

## 2021-03-20 DIAGNOSIS — C7951 Secondary malignant neoplasm of bone: Secondary | ICD-10-CM | POA: Insufficient documentation

## 2021-03-20 DIAGNOSIS — Z95828 Presence of other vascular implants and grafts: Secondary | ICD-10-CM

## 2021-03-20 DIAGNOSIS — Z79899 Other long term (current) drug therapy: Secondary | ICD-10-CM | POA: Insufficient documentation

## 2021-03-20 DIAGNOSIS — K5900 Constipation, unspecified: Secondary | ICD-10-CM | POA: Insufficient documentation

## 2021-03-20 DIAGNOSIS — R937 Abnormal findings on diagnostic imaging of other parts of musculoskeletal system: Secondary | ICD-10-CM

## 2021-03-20 DIAGNOSIS — Z5112 Encounter for antineoplastic immunotherapy: Secondary | ICD-10-CM

## 2021-03-20 LAB — COMPREHENSIVE METABOLIC PANEL
ALT: 12 U/L (ref 0–44)
AST: 19 U/L (ref 15–41)
Albumin: 3.4 g/dL — ABNORMAL LOW (ref 3.5–5.0)
Alkaline Phosphatase: 79 U/L (ref 38–126)
Anion gap: 7 (ref 5–15)
BUN: 19 mg/dL (ref 8–23)
CO2: 23 mmol/L (ref 22–32)
Calcium: 8.9 mg/dL (ref 8.9–10.3)
Chloride: 108 mmol/L (ref 98–111)
Creatinine, Ser: 0.71 mg/dL (ref 0.44–1.00)
GFR, Estimated: >60 mL/min (ref >60)
Glucose, Bld: 91 mg/dL (ref 70–99)
Potassium: 4 mmol/L (ref 3.5–5.1)
Sodium: 138 mmol/L (ref 135–145)
Total Bilirubin: 0.3 mg/dL (ref 0.3–1.2)
Total Protein: 6.8 g/dL (ref 6.5–8.1)

## 2021-03-20 LAB — CBC WITH DIFFERENTIAL/PLATELET
Abs Immature Granulocytes: 0.02 10*3/uL (ref 0.00–0.07)
Basophils Absolute: 0 10*3/uL (ref 0.0–0.1)
Basophils Relative: 0 %
Eosinophils Absolute: 0.3 10*3/uL (ref 0.0–0.5)
Eosinophils Relative: 4 %
HCT: 34.2 % — ABNORMAL LOW (ref 36.0–46.0)
Hemoglobin: 11.9 g/dL — ABNORMAL LOW (ref 12.0–15.0)
Immature Granulocytes: 0 %
Lymphocytes Relative: 19 %
Lymphs Abs: 1.6 10*3/uL (ref 0.7–4.0)
MCH: 32.5 pg (ref 26.0–34.0)
MCHC: 34.8 g/dL (ref 30.0–36.0)
MCV: 93.4 fL (ref 80.0–100.0)
Monocytes Absolute: 0.7 10*3/uL (ref 0.1–1.0)
Monocytes Relative: 9 %
Neutro Abs: 5.6 10*3/uL (ref 1.7–7.7)
Neutrophils Relative %: 68 %
Platelets: 233 10*3/uL (ref 150–400)
RBC: 3.66 MIL/uL — ABNORMAL LOW (ref 3.87–5.11)
RDW: 13.2 % (ref 11.5–15.5)
WBC: 8.3 10*3/uL (ref 4.0–10.5)
nRBC: 0 % (ref 0.0–0.2)

## 2021-03-20 MED ORDER — SODIUM CHLORIDE 0.9 % IV SOLN
Freq: Once | INTRAVENOUS | Status: AC
  Filled 2021-03-20: qty 250

## 2021-03-20 MED ORDER — SODIUM CHLORIDE 0.9% FLUSH
10.0000 mL | INTRAVENOUS | Status: DC | PRN
  Administered 2021-03-20: 10 mL
  Filled 2021-03-20: qty 10

## 2021-03-20 MED ORDER — SODIUM CHLORIDE 0.9 % IV SOLN
1500.0000 mg | Freq: Once | INTRAVENOUS | Status: AC
  Administered 2021-03-20: 1500 mg via INTRAVENOUS
  Filled 2021-03-20: qty 30

## 2021-03-20 MED ORDER — GABAPENTIN 100 MG PO CAPS
100.0000 mg | ORAL_CAPSULE | Freq: Two times a day (BID) | ORAL | 2 refills | Status: DC
Start: 2021-03-20 — End: 2021-06-15

## 2021-03-20 NOTE — Patient Instructions (Signed)
[facility]   Discharge Instructions: Thank you for choosing [facility] to provide your oncology and hematology care.   If you have a lab appointment with the Cancer Center, please go directly to the Cancer Center and check in at the registration area.   Wear comfortable clothing and clothing appropriate for easy access to any Portacath or PICC line.   We strive to give you quality time with your provider. You may need to reschedule your appointment if you arrive late (15 or more minutes).  Arriving late affects you and other patients whose appointments are after yours.  Also, if you miss three or more appointments without notifying the office, you may be dismissed from the clinic at the provider's discretion.      For prescription refill requests, have your pharmacy contact our office and allow 72 hours for refills to be completed.    Today you received the following chemotherapy and/or immunotherapy agents: durvalumab.      To help prevent nausea and vomiting after your treatment, we encourage you to take your nausea medication as directed.  BELOW ARE SYMPTOMS THAT SHOULD BE REPORTED IMMEDIATELY: *FEVER GREATER THAN 100.4 F (38 C) OR HIGHER *CHILLS OR SWEATING *NAUSEA AND VOMITING THAT IS NOT CONTROLLED WITH YOUR NAUSEA MEDICATION *UNUSUAL SHORTNESS OF BREATH *UNUSUAL BRUISING OR BLEEDING *URINARY PROBLEMS (pain or burning when urinating, or frequent urination) *BOWEL PROBLEMS (unusual diarrhea, constipation, pain near the anus) TENDERNESS IN MOUTH AND THROAT WITH OR WITHOUT PRESENCE OF ULCERS (sore throat, sores in mouth, or a toothache) UNUSUAL RASH, SWELLING OR PAIN  UNUSUAL VAGINAL DISCHARGE OR ITCHING   Items with * indicate a potential emergency and should be followed up as soon as possible or go to the Emergency Department if any problems should occur.  Please show the CHEMOTHERAPY ALERT CARD or IMMUNOTHERAPY ALERT CARD at check-in  to the Emergency Department and triage nurse.  Should you have questions after your visit or need to cancel or reschedule your appointment, please contact [facility]  Dept: [phone]  and follow the prompts.  Office hours are 8:00 a.m. to 4:30 p.m. Monday - Friday. Please note that voicemails left after 4:00 p.m. may not be returned until the following business day.  We are closed weekends and major holidays. You have access to a nurse at all times for urgent questions. Please call the main number to the clinic Dept: [phone] and follow the prompts.   For any non-urgent questions, you may also contact your provider using MyChart. We now offer e-Visits for anyone 18 and older to request care online for non-urgent symptoms. For details visit [email or web address].   Also download the MyChart app! Go to the app store, search "MyChart", open the app, select [facility], and log in with your MyChart username and password.  Due to Covid, a mask is required upon entering the hospital/clinic. If you do not have a mask, one will be given to you upon arrival. For doctor visits, patients may have 1 support person aged 18 or older with them. For treatment visits, patients cannot have anyone with them due to current Covid guidelines and our immunocompromised population.   

## 2021-03-20 NOTE — Progress Notes (Signed)
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   PRIOR THERAPY: None   CURRENT THERAPY: Palliative systemic chemotherapy with carboplatin for AUC of 5 on day 1, etoposide 100 mg/M2 on days 1, 2 and 3 as well as Imfinzi 1500 mg every 3 weeks with the chemotherapy and Neulasta support. First dose on 03/16/2019. Status post 27 cycles.   Starting from cycle #5 the patient will be treated with maintenance treatment with Imfinzi 1500 mg IV every 4 weeks.  INTERVAL HISTORY: Brittany Marks 80 y.o. female returns to the clinic today for follow-up visit.  The patient is feeling fine today with no concerning complaints except for neuropathy of the fingers.  She denied having any chest pain, shortness of breath, cough or hemoptysis.  She denied having any fever or chills.  She has no nausea, vomiting, diarrhea or constipation.  She has no headache or visual changes.  She is here today for evaluation before starting cycle #28 of her treatment.  MEDICAL HISTORY: Past Medical History:  Diagnosis Date   Cataract    Bilateral   Constipation    pain medication   Left rotator cuff tear    Right rotator cuff tear    SCL CA dx'd 11/2018    ALLERGIES:  has No Known Allergies.  MEDICATIONS:  Current Outpatient Medications  Medication Sig Dispense Refill   Ascorbic Acid (VITAMIN C PO) Take by mouth daily.      b complex vitamins tablet Take 1 tablet by mouth daily.     COD LIVER OIL PO Take 1 tablet by mouth daily.      cyclobenzaprine (FLEXERIL) 10 MG tablet Take 1 tablet (10 mg total) by mouth 3 (three) times daily as needed  for muscle spasms. 30 tablet 1   HYDROcodone-acetaminophen (NORCO) 5-325 MG tablet Take 1 tablet by mouth every 6 (six) hours as needed for moderate pain. 20 tablet 0   hydrocortisone 2.5 % cream Apply topically as needed. (Patient taking differently: Apply 1 application topically daily as needed (Treatment). ) 3.5 g 0   ibuprofen (ADVIL) 200 MG tablet Take 600 mg by mouth daily.     lidocaine-prilocaine (EMLA) cream Apply 1 application topically every 30 (thirty) days.      Multiple Vitamin (MULTIVITAMIN) tablet Take 1 tablet by mouth daily.     ondansetron (ZOFRAN) 4 MG tablet Take 1 tablet (4 mg total) by mouth every 8 (eight) hours as needed for nausea or vomiting. 10 tablet 0   oxymetazoline (AFRIN) 0.05 % nasal spray Place 2 sprays into the nose 2 (two) times daily as needed (Bloody nose).      prochlorperazine (COMPAZINE) 10 MG tablet Take 1 tablet (10 mg total) by mouth every 6 (six) hours as needed for nausea or vomiting. 30 tablet 0   silver sulfADIAZINE (SILVADENE) 1 % cream Apply 1 application topically 2 (two) times daily. 50 g 1   sodium chloride (OCEAN) 0.65 % nasal spray Place 1 spray into the nose at bedtime.      VITAMIN E PO Take 1 tablet by mouth daily.      No current  facility-administered medications for this visit.   Facility-Administered Medications Ordered in Other Visits  Medication Dose Route Frequency Provider Last Rate Last Admin   heparin lock flush 100 unit/mL  500 Units Intracatheter Once PRN Magrinat, Virgie Dad, MD       sodium chloride flush (NS) 0.9 % injection 10 mL  10 mL Intracatheter PRN Magrinat, Virgie Dad, MD        SURGICAL HISTORY:  Past Surgical History:  Procedure Laterality Date   CATARACT EXTRACTION W/ INTRAOCULAR LENS  IMPLANT, BILATERAL  2012   DECOMPRESSIVE LUMBAR LAMINECTOMY LEVEL 2 N/A 07/29/2013   Procedure: LUMBAR LAMINECTOMY, DECOMPRESSION LUMBAR THREE TO FOUR, FOUR TO FIVE microdiscectomy l3,4 right;  Surgeon: Tobi Bastos, MD;   Location: [facility];  Service: Orthopedics;  Laterality: N/A;   I & D SUPERIOR RIGHT SHOULDER AND CLOSURE WOUND  01-10-2011   S/P ROTATOR CUFF REPAIR   IR IMAGING GUIDED PORT INSERTION  04/09/2019   LUMBAR LAMINECTOMY  1970'S   LUMBAR LAMINECTOMY/DECOMPRESSION MICRODISCECTOMY N/A 06/27/2016   Procedure: L1 - L2 DISCECTOMY;  Surgeon: Melina Schools, MD;  Location: [facility];  Service: Orthopedics;  Laterality: N/A;   LUMBAR SPINE SURGERY  1983   REVERSE SHOULDER ARTHROPLASTY Left 03/10/2020   Procedure: REVERSE SHOULDER ARTHROPLASTY;  Surgeon: Justice Britain, MD;  Location: [facility];  Service: Orthopedics;  Laterality: Left;  188min   RIGHT SHOULDER ARTHROSCOPY/ OPEN DISTAL CLAVICLE RESECTION/ SAD/ OPEN ROTATOR CUFF REPAIR  11-28-2010   SHOULDER FUSION SURGERY  03/11/2020   Shoulder Surgery , hardware placed   SHOULDER OPEN ROTATOR CUFF REPAIR  12/19/2011   Procedure: ROTATOR CUFF REPAIR SHOULDER OPEN;  Surgeon: Magnus Sinning, MD;  Location: [facility];  Service: Orthopedics;  Laterality: Right;  RIGHT RECURRENT OPEN REPAIR OF THE ROTATOR CUFF WITH TISSUE MEND GRAFTANTERIOR CHROMIOECTOMY   VAGINAL HYSTERECTOMY  1979    REVIEW OF SYSTEMS:  A comprehensive review of systems was negative except for: Constitutional: positive for fatigue Neurological: positive for paresthesia   PHYSICAL EXAMINATION: General appearance: alert, cooperative, fatigued, and no distress Head: Normocephalic, without obvious abnormality, atraumatic Neck: no adenopathy, no JVD, supple, symmetrical, trachea midline, and thyroid not enlarged, symmetric, no tenderness/mass/nodules Lymph nodes: Cervical, supraclavicular, and axillary nodes normal. Resp: clear to auscultation bilaterally Back: symmetric, no curvature. ROM normal. No CVA tenderness. Cardio: regular rate and rhythm, S1, S2 normal, no murmur, click, rub or gallop GI: soft, non-tender; bowel sounds normal; no masses,  no organomegaly Extremities: extremities  normal, atraumatic, no cyanosis or edema   ECOG PERFORMANCE STATUS: 1 - Symptomatic but completely ambulatory  Blood pressure (!) 149/60, pulse 73, temperature 97.6 F (36.4 C), temperature source Tympanic, resp. rate 19, height 5\' 2"  (1.575 m), weight 139 lb 9.6 oz (63.3 kg), SpO2 97 %.  LABORATORY DATA: Lab Results  Component Value Date   WBC 8.3 03/20/2021   HGB 11.9 (L) 03/20/2021   HCT 34.2 (L) 03/20/2021   MCV 93.4 03/20/2021   PLT 233 03/20/2021      Chemistry      Component Value Date/Time   NA 137 02/20/2021 1154   K 4.1 02/20/2021 1154   CL 106 02/20/2021 1154   CO2 26 02/20/2021 1154   BUN 20 02/20/2021 1154   CREATININE 0.75 02/20/2021 1154   CREATININE 0.76 12/21/2019 1100      Component Value Date/Time   CALCIUM 8.8 (L) 02/20/2021 1154   ALKPHOS 77 02/20/2021 1154   AST 23 02/20/2021 1154   AST  22 12/21/2019 1100   ALT 13 02/20/2021 1154   ALT 13 12/21/2019 1100   BILITOT 0.4 02/20/2021 1154   BILITOT 0.3 12/21/2019 1100       RADIOGRAPHIC STUDIES: No results found.   ASSESSMENT AND PLAN: This is a very pleasant 80 years old white female with extensive stage small cell lung cancer and she is currently undergoing treatment with carboplatin, etoposide and Imfinzi status post 5 cycles.  Starting from cycle #6 the patient is on maintenance treatment with single agent Imfinzi every 4 weeks status post 27 cycles of total treatment. The patient continues to tolerate her treatment well with no concerning adverse effects. I recommended for the patient to proceed with cycle #28 today as planned. I will see the patient back for follow-up visit in 4 weeks for evaluation before the next cycle of her treatment. For the peripheral neuropathy, I will start her on gabapentin 100 mg p.o. twice daily. She was advised to call immediately if she has any concerning symptoms in the interval. The patient voices understanding of current disease status and treatment options and  is in agreement with the current care plan.  All questions were answered. The patient knows to call the clinic with any problems, questions or concerns. We can certainly see the patient much sooner if necessary.  Disclaimer: This note was dictated with voice recognition software. Similar sounding words can inadvertently be transcribed and may not be corrected upon review.

## 2021-03-20 NOTE — Patient Instructions (Signed)
[facility]   Discharge Instructions: Thank you for choosing [facility] to provide your oncology and hematology care.   If you have a lab appointment with the Cancer Center, please go directly to the Cancer Center and check in at the registration area.   Wear comfortable clothing and clothing appropriate for easy access to any Portacath or PICC line.   We strive to give you quality time with your provider. You may need to reschedule your appointment if you arrive late (15 or more minutes).  Arriving late affects you and other patients whose appointments are after yours.  Also, if you miss three or more appointments without notifying the office, you may be dismissed from the clinic at the provider's discretion.      For prescription refill requests, have your pharmacy contact our office and allow 72 hours for refills to be completed.    Today you received the following chemotherapy and/or immunotherapy agents: durvalumab.      To help prevent nausea and vomiting after your treatment, we encourage you to take your nausea medication as directed.  BELOW ARE SYMPTOMS THAT SHOULD BE REPORTED IMMEDIATELY: *FEVER GREATER THAN 100.4 F (38 C) OR HIGHER *CHILLS OR SWEATING *NAUSEA AND VOMITING THAT IS NOT CONTROLLED WITH YOUR NAUSEA MEDICATION *UNUSUAL SHORTNESS OF BREATH *UNUSUAL BRUISING OR BLEEDING *URINARY PROBLEMS (pain or burning when urinating, or frequent urination) *BOWEL PROBLEMS (unusual diarrhea, constipation, pain near the anus) TENDERNESS IN MOUTH AND THROAT WITH OR WITHOUT PRESENCE OF ULCERS (sore throat, sores in mouth, or a toothache) UNUSUAL RASH, SWELLING OR PAIN  UNUSUAL VAGINAL DISCHARGE OR ITCHING   Items with * indicate a potential emergency and should be followed up as soon as possible or go to the Emergency Department if any problems should occur.  Please show the CHEMOTHERAPY ALERT CARD or IMMUNOTHERAPY ALERT CARD at check-in  to the Emergency Department and triage nurse.  Should you have questions after your visit or need to cancel or reschedule your appointment, please contact [facility]  Dept: [phone]  and follow the prompts.  Office hours are 8:00 a.m. to 4:30 p.m. Monday - Friday. Please note that voicemails left after 4:00 p.m. may not be returned until the following business day.  We are closed weekends and major holidays. You have access to a nurse at all times for urgent questions. Please call the main number to the clinic Dept: [phone] and follow the prompts.   For any non-urgent questions, you may also contact your provider using MyChart. We now offer e-Visits for anyone 18 and older to request care online for non-urgent symptoms. For details visit [email or web address].   Also download the MyChart app! Go to the app store, search "MyChart", open the app, select [facility], and log in with your MyChart username and password.  Due to Covid, a mask is required upon entering the hospital/clinic. If you do not have a mask, one will be given to you upon arrival. For doctor visits, patients may have 1 support person aged 18 or older with them. For treatment visits, patients cannot have anyone with them due to current Covid guidelines and our immunocompromised population.   

## 2021-03-24 ENCOUNTER — Telehealth: Payer: Self-pay | Admitting: Internal Medicine

## 2021-03-24 NOTE — Telephone Encounter (Signed)
Scheduled per los. Called and left msg. Mailed printout  °

## 2021-04-14 ENCOUNTER — Telehealth: Payer: Self-pay | Admitting: Internal Medicine

## 2021-04-14 NOTE — Telephone Encounter (Signed)
R/s appts per 7/7 sch msg. Pt aware.

## 2021-04-17 ENCOUNTER — Ambulatory Visit: Payer: Federal, State, Local not specified - PPO | Admitting: Internal Medicine

## 2021-04-17 ENCOUNTER — Other Ambulatory Visit: Payer: Federal, State, Local not specified - PPO

## 2021-04-17 ENCOUNTER — Ambulatory Visit: Payer: Federal, State, Local not specified - PPO

## 2021-04-18 ENCOUNTER — Encounter: Payer: Self-pay | Admitting: Internal Medicine

## 2021-04-18 ENCOUNTER — Inpatient Hospital Stay (HOSPITAL_BASED_OUTPATIENT_CLINIC_OR_DEPARTMENT_OTHER): Payer: Federal, State, Local not specified - PPO | Admitting: Internal Medicine

## 2021-04-18 ENCOUNTER — Inpatient Hospital Stay: Payer: Federal, State, Local not specified - PPO

## 2021-04-18 ENCOUNTER — Inpatient Hospital Stay: Payer: Federal, State, Local not specified - PPO | Attending: Oncology

## 2021-04-18 ENCOUNTER — Other Ambulatory Visit: Payer: Self-pay

## 2021-04-18 VITALS — BP 158/71 | HR 72 | Temp 98.3°F | Resp 18 | Ht 62.0 in | Wt 141.2 lb

## 2021-04-18 DIAGNOSIS — C7951 Secondary malignant neoplasm of bone: Secondary | ICD-10-CM | POA: Insufficient documentation

## 2021-04-18 DIAGNOSIS — G629 Polyneuropathy, unspecified: Secondary | ICD-10-CM | POA: Insufficient documentation

## 2021-04-18 DIAGNOSIS — C3411 Malignant neoplasm of upper lobe, right bronchus or lung: Secondary | ICD-10-CM

## 2021-04-18 DIAGNOSIS — Z1211 Encounter for screening for malignant neoplasm of colon: Secondary | ICD-10-CM

## 2021-04-18 DIAGNOSIS — Z5112 Encounter for antineoplastic immunotherapy: Secondary | ICD-10-CM | POA: Insufficient documentation

## 2021-04-18 DIAGNOSIS — Z95828 Presence of other vascular implants and grafts: Secondary | ICD-10-CM

## 2021-04-18 DIAGNOSIS — K5900 Constipation, unspecified: Secondary | ICD-10-CM | POA: Insufficient documentation

## 2021-04-18 DIAGNOSIS — R937 Abnormal findings on diagnostic imaging of other parts of musculoskeletal system: Secondary | ICD-10-CM

## 2021-04-18 DIAGNOSIS — C3490 Malignant neoplasm of unspecified part of unspecified bronchus or lung: Secondary | ICD-10-CM | POA: Diagnosis not present

## 2021-04-18 DIAGNOSIS — I10 Essential (primary) hypertension: Secondary | ICD-10-CM | POA: Diagnosis not present

## 2021-04-18 DIAGNOSIS — Z79899 Other long term (current) drug therapy: Secondary | ICD-10-CM | POA: Insufficient documentation

## 2021-04-18 DIAGNOSIS — R5383 Other fatigue: Secondary | ICD-10-CM | POA: Insufficient documentation

## 2021-04-18 LAB — COMPREHENSIVE METABOLIC PANEL
ALT: 14 U/L (ref 0–44)
AST: 24 U/L (ref 15–41)
Albumin: 3.5 g/dL (ref 3.5–5.0)
Alkaline Phosphatase: 77 U/L (ref 38–126)
Anion gap: 7 (ref 5–15)
BUN: 20 mg/dL (ref 8–23)
CO2: 26 mmol/L (ref 22–32)
Calcium: 9.3 mg/dL (ref 8.9–10.3)
Chloride: 104 mmol/L (ref 98–111)
Creatinine, Ser: 0.76 mg/dL (ref 0.44–1.00)
GFR, Estimated: >60 mL/min (ref >60)
Glucose, Bld: 104 mg/dL — ABNORMAL HIGH (ref 70–99)
Potassium: 4.3 mmol/L (ref 3.5–5.1)
Sodium: 137 mmol/L (ref 135–145)
Total Bilirubin: 0.3 mg/dL (ref 0.3–1.2)
Total Protein: 7.2 g/dL (ref 6.5–8.1)

## 2021-04-18 LAB — CBC WITH DIFFERENTIAL/PLATELET
Abs Immature Granulocytes: 0.03 10*3/uL (ref 0.00–0.07)
Basophils Absolute: 0 10*3/uL (ref 0.0–0.1)
Basophils Relative: 0 %
Eosinophils Absolute: 0.2 10*3/uL (ref 0.0–0.5)
Eosinophils Relative: 2 %
HCT: 36.8 % (ref 36.0–46.0)
Hemoglobin: 12.5 g/dL (ref 12.0–15.0)
Immature Granulocytes: 0 %
Lymphocytes Relative: 18 %
Lymphs Abs: 1.6 10*3/uL (ref 0.7–4.0)
MCH: 31.8 pg (ref 26.0–34.0)
MCHC: 34 g/dL (ref 30.0–36.0)
MCV: 93.6 fL (ref 80.0–100.0)
Monocytes Absolute: 0.7 10*3/uL (ref 0.1–1.0)
Monocytes Relative: 8 %
Neutro Abs: 6.3 10*3/uL (ref 1.7–7.7)
Neutrophils Relative %: 72 %
Platelets: 221 10*3/uL (ref 150–400)
RBC: 3.93 MIL/uL (ref 3.87–5.11)
RDW: 13.2 % (ref 11.5–15.5)
WBC: 8.9 10*3/uL (ref 4.0–10.5)
nRBC: 0 % (ref 0.0–0.2)

## 2021-04-18 LAB — TSH: TSH: 2.525 u[IU]/mL (ref 0.308–3.960)

## 2021-04-18 MED ORDER — SODIUM CHLORIDE 0.9% FLUSH
10.0000 mL | INTRAVENOUS | Status: DC | PRN
  Administered 2021-04-18: 10 mL
  Filled 2021-04-18: qty 10

## 2021-04-18 MED ORDER — SODIUM CHLORIDE 0.9 % IV SOLN
1500.0000 mg | Freq: Once | INTRAVENOUS | Status: AC
  Administered 2021-04-18: 1500 mg via INTRAVENOUS
  Filled 2021-04-18: qty 30

## 2021-04-18 MED ORDER — SODIUM CHLORIDE 0.9 % IV SOLN
Freq: Once | INTRAVENOUS | Status: AC
  Filled 2021-04-18: qty 250

## 2021-04-18 MED ORDER — HEPARIN SOD (PORK) LOCK FLUSH 100 UNIT/ML IV SOLN
500.0000 [IU] | Freq: Once | INTRAVENOUS | Status: AC | PRN
Start: 2021-04-18 — End: 2021-04-18
  Administered 2021-04-18: 500 [IU]
  Filled 2021-04-18: qty 5

## 2021-04-18 NOTE — Patient Instructions (Signed)
[facility]  Discharge Instructions: Thank you for choosing [facility] to provide your oncology and hematology care.   If you have a lab appointment with the [facility], please go directly to the [facility] and check in at the registration area.   Wear comfortable clothing and clothing appropriate for easy access to any Portacath or PICC line.   We strive to give you quality time with your provider. You may need to reschedule your appointment if you arrive late (15 or more minutes).  Arriving late affects you and other patients whose appointments are after yours.  Also, if you miss three or more appointments without notifying the office, you may be dismissed from the clinic at the provider's discretion.      For prescription refill requests, have your pharmacy contact our office and allow 72 hours for refills to be completed.    Today you received the following chemotherapy and/or immunotherapy agents infimzi      To help prevent nausea and vomiting after your treatment, we encourage you to take your nausea medication as directed.  BELOW ARE SYMPTOMS THAT SHOULD BE REPORTED IMMEDIATELY: *FEVER GREATER THAN 100.4 F (38 C) OR HIGHER *CHILLS OR SWEATING *NAUSEA AND VOMITING THAT IS NOT CONTROLLED WITH YOUR NAUSEA MEDICATION *UNUSUAL SHORTNESS OF BREATH *UNUSUAL BRUISING OR BLEEDING *URINARY PROBLEMS (pain or burning when urinating, or frequent urination) *BOWEL PROBLEMS (unusual diarrhea, constipation, pain near the anus) TENDERNESS IN MOUTH AND THROAT WITH OR WITHOUT PRESENCE OF ULCERS (sore throat, sores in mouth, or a toothache) UNUSUAL RASH, SWELLING OR PAIN  UNUSUAL VAGINAL DISCHARGE OR ITCHING   Items with * indicate a potential emergency and should be followed up as soon as possible or go to the Emergency Department if any problems should occur.  Please show the CHEMOTHERAPY ALERT CARD or IMMUNOTHERAPY ALERT CARD at check-in to the  Emergency Department and triage nurse.  Should you have questions after your visit or need to cancel or reschedule your appointment, please contact [facility]  Dept: [phone]  and follow the prompts.  Office hours are 8:00 a.m. to 4:30 p.m. Monday - Friday. Please note that voicemails left after 4:00 p.m. may not be returned until the following business day.  We are closed weekends and major holidays. You have access to a nurse at all times for urgent questions. Please call the main number to the clinic Dept: [phone] and follow the prompts.   For any non-urgent questions, you may also contact your provider using MyChart. We now offer e-Visits for anyone 89 and older to request care online for non-urgent symptoms. For details visit [email or web address].   Also download the MyChart app! Go to the app store, search "MyChart", open the app, select [facility], and log in with your MyChart username and password.  Due to Covid, a mask is required upon entering the hospital/clinic. If you do not have a mask, one will be given to you upon arrival. For doctor visits, patients may have 1 support person aged 63 or older with them. For treatment visits, patients cannot have anyone with them due to current Covid guidelines and our immunocompromised population.

## 2021-04-18 NOTE — Progress Notes (Signed)
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   PRIOR THERAPY: None   CURRENT THERAPY: Palliative systemic chemotherapy with carboplatin for AUC of 5 on day 1, etoposide 100 mg/M2 on days 1, 2 and 3 as well as Imfinzi 1500 mg every 3 weeks with the chemotherapy and Neulasta support. First dose on 03/16/2019. Status post 28 cycles.   Starting from cycle #5 the patient will be treated with maintenance treatment with Imfinzi 1500 mg IV every 4 weeks.  INTERVAL HISTORY: Brittany Marks 80 y.o. female returns to the clinic today for follow-up visit.  The patient is feeling fine today with no concerning complaints except for fatigue and peripheral neuropathy.  She is currently on gabapentin 100 mg p.o. twice daily with no improvement in her peripheral neuropathy.  She denied having any chest pain, shortness of breath, cough or hemoptysis.  She denied having any nausea, vomiting, diarrhea or constipation.  She has no headache or visual changes.  She celebrated her 80th birthday 2 weeks ago.  The patient is here today for evaluation before starting cycle #29 of her treatment.   MEDICAL HISTORY: Past Medical History:  Diagnosis Date   Cataract    Bilateral   Constipation    pain medication   Left rotator cuff tear    Right rotator cuff tear    SCL CA dx'd 11/2018    ALLERGIES:  has No Known Allergies.  MEDICATIONS:  Current Outpatient Medications  Medication Sig Dispense Refill   Ascorbic Acid (VITAMIN C PO) Take by mouth daily.      b complex vitamins tablet Take 1 tablet by mouth daily.     COD LIVER OIL PO  Take 1 tablet by mouth daily.      cyclobenzaprine (FLEXERIL) 10 MG tablet Take 1 tablet (10 mg total) by mouth 3 (three) times daily as needed for muscle spasms. 30 tablet 1   gabapentin (NEURONTIN) 100 MG capsule Take 1 capsule (100 mg total) by mouth 2 (two) times daily. 60 capsule 2   HYDROcodone-acetaminophen (NORCO) 5-325 MG tablet Take 1 tablet by mouth every 6 (six) hours as needed for moderate pain. 20 tablet 0   hydrocortisone 2.5 % cream Apply topically as needed. (Patient taking differently: Apply 1 application topically daily as needed (Treatment).) 3.5 g 0   ibuprofen (ADVIL) 200 MG tablet Take 600 mg by mouth daily.     lidocaine-prilocaine (EMLA) cream Apply 1 application topically every 30 (thirty) days.      Multiple Vitamin (MULTIVITAMIN) tablet Take 1 tablet by mouth daily.     ondansetron (ZOFRAN) 4 MG tablet Take 1 tablet (4 mg total) by mouth every 8 (eight) hours as needed for nausea or vomiting. (Patient not taking: Reported on 03/20/2021) 10 tablet 0   oxymetazoline (AFRIN) 0.05 % nasal spray Place 2 sprays into the nose 2 (two) times daily as needed (Bloody nose).      prochlorperazine (COMPAZINE) 10 MG tablet Take 1 tablet (10 mg total) by mouth every 6 (six) hours as needed for nausea or vomiting. 30 tablet 0   silver sulfADIAZINE (SILVADENE) 1 % cream  Apply 1 application topically 2 (two) times daily. 50 g 1   sodium chloride (OCEAN) 0.65 % nasal spray Place 1 spray into the nose at bedtime.      VITAMIN E PO Take 1 tablet by mouth daily.      No current facility-administered medications for this visit.   Facility-Administered Medications Ordered in Other Visits  Medication Dose Route Frequency Provider Last Rate Last Admin   heparin lock flush 100 unit/mL  500 Units Intracatheter Once PRN Magrinat, Virgie Dad, MD       sodium chloride flush (NS) 0.9 % injection 10 mL  10 mL Intracatheter PRN Magrinat, Virgie Dad, MD       sodium chloride flush (NS) 0.9 % injection 10 mL   10 mL Intracatheter PRN Magrinat, Virgie Dad, MD   10 mL at 04/18/21 1159    SURGICAL HISTORY:  Past Surgical History:  Procedure Laterality Date   CATARACT EXTRACTION W/ INTRAOCULAR LENS  IMPLANT, BILATERAL  2012   DECOMPRESSIVE LUMBAR LAMINECTOMY LEVEL 2 N/A 07/29/2013   Procedure: LUMBAR LAMINECTOMY, DECOMPRESSION LUMBAR THREE TO FOUR, FOUR TO FIVE microdiscectomy l3,4 right;  Surgeon: Tobi Bastos, MD;  Location[facility];  Service: Orthopedics;  Laterality: N/A;   I & D SUPERIOR RIGHT SHOULDER AND CLOSURE WOUND  01-10-2011   S/P ROTATOR CUFF REPAIR   IR IMAGING GUIDED PORT INSERTION  04/09/2019   LUMBAR LAMINECTOMY  1970'S   LUMBAR LAMINECTOMY/DECOMPRESSION MICRODISCECTOMY N/A 06/27/2016   Procedure: L1 - L2 DISCECTOMY;  Surgeon: Melina Schools, MD;  Location: [facility];  Service: Orthopedics;  Laterality: N/A;   LUMBAR SPINE SURGERY  1983   REVERSE SHOULDER ARTHROPLASTY Left 03/10/2020   Procedure: REVERSE SHOULDER ARTHROPLASTY;  Surgeon: Justice Britain, MD;  [facility];  Service: Orthopedics;  Laterality: Left;  124min   RIGHT SHOULDER ARTHROSCOPY/ OPEN DISTAL CLAVICLE RESECTION/ SAD/ OPEN ROTATOR CUFF REPAIR  11-28-2010   SHOULDER FUSION SURGERY  03/11/2020   Shoulder Surgery , hardware placed   SHOULDER OPEN ROTATOR CUFF REPAIR  12/19/2011   Procedure: ROTATOR CUFF REPAIR SHOULDER OPEN;  Surgeon: Magnus Sinning, MD;  Location: [facility];  Service: Orthopedics;  Laterality: Right;  RIGHT RECURRENT OPEN REPAIR OF THE ROTATOR CUFF WITH TISSUE MEND GRAFTANTERIOR CHROMIOECTOMY   VAGINAL HYSTERECTOMY  1979    REVIEW OF SYSTEMS:  A comprehensive review of systems was negative except for: Constitutional: positive for fatigue Neurological: positive for paresthesia   PHYSICAL EXAMINATION: General appearance: alert, cooperative, fatigued, and no distress Head: Normocephalic, without obvious abnormality, atraumatic Neck: no adenopathy, no JVD, supple, symmetrical, trachea  midline, and thyroid not enlarged, symmetric, no tenderness/mass/nodules Lymph nodes: Cervical, supraclavicular, and axillary nodes normal. Resp: clear to auscultation bilaterally Back: symmetric, no curvature. ROM normal. No CVA tenderness. Cardio: regular rate and rhythm, S1, S2 normal, no murmur, click, rub or gallop GI: soft, non-tender; bowel sounds normal; no masses,  no organomegaly Extremities: extremities normal, atraumatic, no cyanosis or edema   ECOG PERFORMANCE STATUS: 1 - Symptomatic but completely ambulatory  Blood pressure (!) 158/71, pulse 72, temperature 98.3 F (36.8 C), temperature source Oral, resp. rate 18, height 5\' 2"  (1.575 m), weight 141 lb 3.2 oz (64 kg), SpO2 98 %.  LABORATORY DATA: Lab Results  Component Value Date   WBC 8.3 03/20/2021   HGB 11.9 (L) 03/20/2021   HCT 34.2 (L) 03/20/2021   MCV 93.4 03/20/2021   PLT 233 03/20/2021      Chemistry  Component Value Date/Time   NA 138 03/20/2021 1119   K 4.0 03/20/2021 1119   CL 108 03/20/2021 1119   CO2 23 03/20/2021 1119   BUN 19 03/20/2021 1119   CREATININE 0.71 03/20/2021 1119   CREATININE 0.76 12/21/2019 1100      Component Value Date/Time   CALCIUM 8.9 03/20/2021 1119   ALKPHOS 79 03/20/2021 1119   AST 19 03/20/2021 1119   AST 22 12/21/2019 1100   ALT 12 03/20/2021 1119   ALT 13 12/21/2019 1100   BILITOT 0.3 03/20/2021 1119   BILITOT 0.3 12/21/2019 1100       RADIOGRAPHIC STUDIES: No results found.   ASSESSMENT AND PLAN: This is a very pleasant 80 years old white female with extensive stage small cell lung cancer and she is currently undergoing treatment with carboplatin, etoposide and Imfinzi status post 5 cycles.  Starting from cycle #6 the patient is on maintenance treatment with single agent Imfinzi every 4 weeks status post 28 cycles of total treatment. She continues to tolerate this treatment well with no concerning complaints except for mild fatigue and peripheral  neuropathy. I recommended for her to proceed with cycle #29 today as planned. For the peripheral neuropathy, will increase her dose of gabapentin to 200 mg p.o. twice daily. I will see her back for follow-up visit in 4 weeks for evaluation with repeat CT scan of the chest, abdomen pelvis for restaging of her disease. For the hypertension she was advised to take her blood pressure medications and monitor it closely at home. She was advised to call immediately if she has any other concerning symptoms in the interval.  The patient voices understanding of current disease status and treatment options and is in agreement with the current care plan.  All questions were answered. The patient knows to call the clinic with any problems, questions or concerns. We can certainly see the patient much sooner if necessary.  Disclaimer: This note was dictated with voice recognition software. Similar sounding words can inadvertently be transcribed and may not be corrected upon review.

## 2021-05-10 NOTE — Progress Notes (Signed)
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 Lisbeth Renshaw. 2) Additional palliative radiotherapy to the proximal humerus under the care of Dr. Lisbeth Renshaw. Last treatment on 04/21/2020  CURRENT THERAPY: Palliative systemic chemotherapy with carboplatin for AUC of 5 on day 1, etoposide 100 mg/M2 on days 1, 2 and 3 as well as Imfinzi 1500 mg every 3 weeks with the chemotherapy and Neulasta support. First dose on 03/16/2019. Status post 29 cycles. Starting from cycle #5 the patient will be treated with maintenance treatment with Imfinzi 1500 mg IV every 4 weeks.  INTERVAL HISTORY: Brittany Marks 80 y.o. female returns to the clinic today for a follow-up visit.  The patient is feeling fairly well today without any concerning complaints except for fatigue and peripheral neuropathy.  Dr. Julien Nordmann increased her gabapentin at her last appointment 200 100 mg twice daily but she is still taking 100 mg BID.  The patient denies any recent fever, chills, or night sweats.  Her weight is stable.  She denies any chest pain or hemoptysis.  She reports her baseline dyspnea on exertion.  She denies any significant cough, chest pain, or hemoptysis.  She denies any nausea, vomiting, or constipation.  She had diarrhea after drinking the oral contrast for her recent restaging CT scan.  She denies any headache or visual changes.  She denies any rashes or skin changes except for dry flaky skin.  The patient recently had a restaging CT scan performed.  She is here today for evaluation and to review her scan results before  starting cycle #30.  MEDICAL HISTORY: Past Medical History:  Diagnosis Date   Cataract    Bilateral   Constipation    pain medication   Left rotator cuff tear    Right rotator cuff tear    SCL CA dx'd 11/2018    ALLERGIES:  has No Known Allergies.  MEDICATIONS:  Current Outpatient Medications  Medication Sig Dispense Refill   Ascorbic Acid (VITAMIN C PO) Take by mouth daily.      b complex vitamins tablet Take 1 tablet by mouth daily.     COD LIVER OIL PO Take 1 tablet by mouth daily.      cyclobenzaprine (FLEXERIL) 10 MG tablet Take 1 tablet (10 mg total) by mouth 3 (three) times daily as needed for muscle spasms. 30 tablet 1   gabapentin (NEURONTIN) 100 MG capsule Take 1 capsule (100 mg total) by mouth 2 (two) times daily. 60 capsule 2   HYDROcodone-acetaminophen (NORCO) 5-325 MG tablet Take 1 tablet by mouth every 6 (six) hours as needed for moderate pain. 20 tablet 0   hydrocortisone 2.5 % cream Apply topically as needed. (Patient taking differently: Apply 1 application topically daily as needed (Treatment).) 3.5 g 0   ibuprofen (ADVIL) 200 MG tablet Take 600 mg by mouth daily.     lidocaine-prilocaine (EMLA) cream Apply 1 application topically every 30 (thirty) days.      Multiple Vitamin (MULTIVITAMIN) tablet Take 1 tablet by mouth daily.     ondansetron (ZOFRAN) 4 MG tablet Take 1 tablet (4 mg total) by mouth every  8 (eight) hours as needed for nausea or vomiting. 10 tablet 0   oxymetazoline (AFRIN) 0.05 % nasal spray Place 2 sprays into the nose 2 (two) times daily as needed (Bloody nose).      prochlorperazine (COMPAZINE) 10 MG tablet Take 1 tablet (10 mg total) by mouth every 6 (six) hours as needed for nausea or vomiting. 30 tablet 0   silver sulfADIAZINE (SILVADENE) 1 % cream Apply 1 application topically 2 (two) times daily. 50 g 1   sodium chloride (OCEAN) 0.65 % nasal spray Place 1 spray into the nose at bedtime.     VITAMIN E PO Take 1 tablet by mouth daily.      No  current facility-administered medications for this visit.   Facility-Administered Medications Ordered in Other Visits  Medication Dose Route Frequency Provider Last Rate Last Admin   heparin lock flush 100 unit/mL  500 Units Intracatheter Once PRN Magrinat, Virgie Dad, MD       sodium chloride flush (NS) 0.9 % injection 10 mL  10 mL Intracatheter PRN Magrinat, Virgie Dad, MD        SURGICAL HISTORY:  Past Surgical History:  Procedure Laterality Date   CATARACT EXTRACTION W/ INTRAOCULAR LENS  IMPLANT, BILATERAL  2012   DECOMPRESSIVE LUMBAR LAMINECTOMY LEVEL 2 N/A 07/29/2013   Procedure: LUMBAR LAMINECTOMY, DECOMPRESSION LUMBAR THREE TO FOUR, FOUR TO FIVE microdiscectomy l3,4 right;  Surgeon: Tobi Bastos, MD;  Location: [facility];  Service: Orthopedics;  Laterality: N/A;   I & D SUPERIOR RIGHT SHOULDER AND CLOSURE WOUND  01-10-2011   S/P ROTATOR CUFF REPAIR   IR IMAGING GUIDED PORT INSERTION  04/09/2019   LUMBAR LAMINECTOMY  1970'S   LUMBAR LAMINECTOMY/DECOMPRESSION MICRODISCECTOMY N/A 06/27/2016   Procedure: L1 - L2 DISCECTOMY;  Surgeon: Melina Schools, MD;  Location: [facility];  Service: Orthopedics;  Laterality: N/A;   LUMBAR SPINE SURGERY  1983   REVERSE SHOULDER ARTHROPLASTY Left 03/10/2020   Procedure: REVERSE SHOULDER ARTHROPLASTY;  Surgeon: Justice Britain, MD;  Location: [facility];  Service: Orthopedics;  Laterality: Left;  157min   RIGHT SHOULDER ARTHROSCOPY/ OPEN DISTAL CLAVICLE RESECTION/ SAD/ OPEN ROTATOR CUFF REPAIR  11-28-2010   SHOULDER FUSION SURGERY  03/11/2020   Shoulder Surgery , hardware placed   SHOULDER OPEN ROTATOR CUFF REPAIR  12/19/2011   Procedure: ROTATOR CUFF REPAIR SHOULDER OPEN;  Surgeon: Magnus Sinning, MD;  Location: [facility];  Service: Orthopedics;  Laterality: Right;  RIGHT RECURRENT OPEN REPAIR OF THE ROTATOR CUFF WITH TISSUE MEND GRAFTANTERIOR CHROMIOECTOMY   VAGINAL HYSTERECTOMY  1979    REVIEW OF SYSTEMS:   Review of Systems  Constitutional:  Positive for fatigue.  Negative for appetite change, chills, fever and unexpected weight change.  HENT:   Negative for mouth sores, nosebleeds, sore throat and trouble swallowing.   Eyes: Negative for eye problems and icterus.  Respiratory: Positive for stable dyspnea on exertion.  Negative for cough, hemoptysis, and wheezing.   Cardiovascular: Negative for chest pain and leg swelling.  Gastrointestinal: Positive for diarrhea following oral contrast for CT scan. Negative for abdominal pain, constipation, diarrhea, nausea and vomiting.  Genitourinary: Negative for bladder incontinence, difficulty urinating, dysuria, frequency and hematuria.   Musculoskeletal: Negative for back pain, gait problem, neck pain and neck stiffness.  Skin: Negative for itching and rash.  Neurological: Negative for dizziness, extremity weakness, gait problem, headaches, light-headedness and seizures.  Hematological: Negative for adenopathy. Does not bruise/bleed easily.  Psychiatric/Behavioral: Negative for confusion, depression and sleep  disturbance. The patient is not nervous/anxious.     PHYSICAL EXAMINATION:  Blood pressure (!) 153/66, pulse 69, temperature (!) 97.5 F (36.4 C), temperature source Tympanic, resp. rate 18, height 5\' 2"  (1.575 m), weight 141 lb 9.6 oz (64.2 kg), SpO2 96 %.  ECOG PERFORMANCE STATUS: 2-3  Physical Exam  Constitutional: Oriented to person, place, and time and well-developed, well-nourished, and in no distress. HENT: Head: Normocephalic and atraumatic. Mouth/Throat: Oropharynx is clear and moist. No oropharyngeal exudate. Eyes: Conjunctivae are normal. Right eye exhibits no discharge. Left eye exhibits no discharge. No scleral icterus. Neck: Normal range of motion. Neck supple. Cardiovascular: Normal rate, regular rhythm, normal heart sounds and intact distal pulses.   Pulmonary/Chest: Effort normal and breath sounds normal except crackles noted bilaterally in the lung bases. No  respiratory distress. No wheezes. Abdominal: Soft. Bowel sounds are normal. Exhibits no distension and no mass. There is no tenderness.  Musculoskeletal: Normal range of motion. Exhibits no edema.  Lymphadenopathy:    No cervical adenopathy.  Neurological: Alert and oriented to person, place, and time. Exhibits muscle wasting.  The patient was examined in the wheelchair.  Skin: No vesicles. Dry skin. Skin is warm and dry. Not diaphoretic. No pallor.  Psychiatric: Mood, memory and judgment normal. Vitals reviewed.  LABORATORY DATA: Lab Results  Component Value Date   WBC 8.0 05/15/2021   HGB 11.6 (L) 05/15/2021   HCT 33.3 (L) 05/15/2021   MCV 93.0 05/15/2021   PLT 219 05/15/2021      Chemistry      Component Value Date/Time   NA 138 05/15/2021 1135   K 4.1 05/15/2021 1135   CL 106 05/15/2021 1135   CO2 24 05/15/2021 1135   BUN 16 05/15/2021 1135   CREATININE 0.74 05/15/2021 1135   CREATININE 0.76 12/21/2019 1100      Component Value Date/Time   CALCIUM 8.9 05/15/2021 1135   ALKPHOS 81 05/15/2021 1135   AST 25 05/15/2021 1135   AST 22 12/21/2019 1100   ALT 16 05/15/2021 1135   ALT 13 12/21/2019 1100   BILITOT 0.4 05/15/2021 1135   BILITOT 0.3 12/21/2019 1100       RADIOGRAPHIC STUDIES:  CT Chest W Contrast  Result Date: 05/15/2021 CLINICAL DATA:  Non-small cell lung cancer staging. Chemotherapy and radiation therapy complete, ongoing immunotherapy. EXAM: CT CHEST, ABDOMEN, AND PELVIS WITH CONTRAST TECHNIQUE: Multidetector CT imaging of the chest, abdomen and pelvis was performed following the standard protocol during bolus administration of intravenous contrast. CONTRAST:  23mL OMNIPAQUE IOHEXOL 350 MG/ML SOLN COMPARISON:  02/17/2021 FINDINGS: CT CHEST FINDINGS Cardiovascular: Right Port-A-Cath tip: Right atrium. Coronary, aortic arch, and branch vessel atherosclerotic vascular disease. Mild cardiomegaly. Mediastinum/Nodes: Anterior right paratracheal node 1.7 cm in short  axis on image 15 series 2, formerly the same by my measurements. Lower anterior right paratracheal lymph node 1.6 cm in short axis on image 20 series 2, previously the same by my measurements. Lower right paratracheal lymph node on image 23 series 2 measures 1.7 cm in short axis, previously the same. Appearance compatible with stable moderate mediastinal adenopathy. Small type 1 hiatal hernia. Lungs/Pleura: Biapical pleuroparenchymal scarring. Peripheral interstitial accentuation in the lungs with honeycombing. Emphysema. Stable confluent peripheral opacity in the left upper lobe measures up to 1.5 cm in thickness, previously measured at 1.6 cm, image 39 series 6. Musculoskeletal: Left shoulder reverse arthroplasty. Markedly severe arthropathy of the right glenohumeral joint as before. Mild diffuse sclerosis without focal osseous metastatic lesion identified. CT  ABDOMEN PELVIS FINDINGS Hepatobiliary: 0.6 by 0.3 cm faint hypodensity in the right hepatic lobe on image 42 series 2 not substantially changed from recent prior exams. This lesion is too small to characterize. Gallbladder unremarkable. No biliary dilatation. Pancreas: Unremarkable Spleen: Unremarkable Adrenals/Urinary Tract: Both adrenal glands appear normal. Moderate hydronephrosis with prominent dilated right extrarenal pelvis but no hydroureter suggesting chronic right UPJ narrowing. Urinary bladder unremarkable. Scarring in the right kidney upper pole. Stomach/Bowel: Distal colonic air-fluid levels raising the possibility of diarrheal process. Orally administered contrast extends through to the rectum. Normal appendix. Vascular/Lymphatic: Dense atherosclerotic calcification involving the abdominal aorta and its branches. No pathologic adenopathy. Reproductive: Uterus absent.  Adnexa unremarkable. Other: No supplemental non-categorized findings. Musculoskeletal: Grade 1 degenerative retrolisthesis at L1-2 and L2-3. Multilevel lumbar spondylosis and  degenerative disc disease causing impingement. IMPRESSION: 1. Similar appearance of moderate thoracic adenopathy compared to prior exams, without progression or findings of metastatic disease in the abdomen/pelvis. 2. Stable peripheral fibrosis and early honeycombing in the lungs. Confluent subpleural density in the left upper lobe appear stable. 3. Stable moderate right hydronephrosis and prominent renal pelvis favoring chronic right UPJ obstruction. 4. Air fluid levels in the distal colon suggesting diarrheal process. 5. Other imaging findings of potential clinical significance: Aortic Atherosclerosis (ICD10-I70.0). Coronary atherosclerosis. Small type 1 hiatal hernia. Severe right glenohumeral arthropathy. Multilevel lumbar impingement. Electronically Signed   By: Van Clines M.D.   On: 05/15/2021 08:57   CT Abdomen Pelvis W Contrast  Result Date: 05/15/2021 CLINICAL DATA:  Non-small cell lung cancer staging. Chemotherapy and radiation therapy complete, ongoing immunotherapy. EXAM: CT CHEST, ABDOMEN, AND PELVIS WITH CONTRAST TECHNIQUE: Multidetector CT imaging of the chest, abdomen and pelvis was performed following the standard protocol during bolus administration of intravenous contrast. CONTRAST:  57mL OMNIPAQUE IOHEXOL 350 MG/ML SOLN COMPARISON:  02/17/2021 FINDINGS: CT CHEST FINDINGS Cardiovascular: Right Port-A-Cath tip: Right atrium. Coronary, aortic arch, and branch vessel atherosclerotic vascular disease. Mild cardiomegaly. Mediastinum/Nodes: Anterior right paratracheal node 1.7 cm in short axis on image 15 series 2, formerly the same by my measurements. Lower anterior right paratracheal lymph node 1.6 cm in short axis on image 20 series 2, previously the same by my measurements. Lower right paratracheal lymph node on image 23 series 2 measures 1.7 cm in short axis, previously the same. Appearance compatible with stable moderate mediastinal adenopathy. Small type 1 hiatal hernia. Lungs/Pleura:  Biapical pleuroparenchymal scarring. Peripheral interstitial accentuation in the lungs with honeycombing. Emphysema. Stable confluent peripheral opacity in the left upper lobe measures up to 1.5 cm in thickness, previously measured at 1.6 cm, image 39 series 6. Musculoskeletal: Left shoulder reverse arthroplasty. Markedly severe arthropathy of the right glenohumeral joint as before. Mild diffuse sclerosis without focal osseous metastatic lesion identified. CT ABDOMEN PELVIS FINDINGS Hepatobiliary: 0.6 by 0.3 cm faint hypodensity in the right hepatic lobe on image 42 series 2 not substantially changed from recent prior exams. This lesion is too small to characterize. Gallbladder unremarkable. No biliary dilatation. Pancreas: Unremarkable Spleen: Unremarkable Adrenals/Urinary Tract: Both adrenal glands appear normal. Moderate hydronephrosis with prominent dilated right extrarenal pelvis but no hydroureter suggesting chronic right UPJ narrowing. Urinary bladder unremarkable. Scarring in the right kidney upper pole. Stomach/Bowel: Distal colonic air-fluid levels raising the possibility of diarrheal process. Orally administered contrast extends through to the rectum. Normal appendix. Vascular/Lymphatic: Dense atherosclerotic calcification involving the abdominal aorta and its branches. No pathologic adenopathy. Reproductive: Uterus absent.  Adnexa unremarkable. Other: No supplemental non-categorized findings. Musculoskeletal: Grade 1 degenerative retrolisthesis  at L1-2 and L2-3. Multilevel lumbar spondylosis and degenerative disc disease causing impingement. IMPRESSION: 1. Similar appearance of moderate thoracic adenopathy compared to prior exams, without progression or findings of metastatic disease in the abdomen/pelvis. 2. Stable peripheral fibrosis and early honeycombing in the lungs. Confluent subpleural density in the left upper lobe appear stable. 3. Stable moderate right hydronephrosis and prominent renal pelvis  favoring chronic right UPJ obstruction. 4. Air fluid levels in the distal colon suggesting diarrheal process. 5. Other imaging findings of potential clinical significance: Aortic Atherosclerosis (ICD10-I70.0). Coronary atherosclerosis. Small type 1 hiatal hernia. Severe right glenohumeral arthropathy. Multilevel lumbar impingement. Electronically Signed   By: Van Clines M.D.   On: 05/15/2021 08:57     ASSESSMENT/PLAN:  This is a very pleasant 80 year old Caucasian female diagnosed with extensive stage (T1b, N3, M1 C) small cell lung cancer.  She presented with right upper lobe pulmonary nodules in addition to bilateral hilar and right mediastinal as well as left supraclavicular and abdominal lymphadenopathy.  She also has metastatic disease to the in the left humerus.  She was diagnosed in May 2020.   She is status post palliative radiotherapy to the left proximal humerus under the care of Dr. Lisbeth Renshaw in 2020.   She is currently undergoing palliative systemic chemotherapy with carboplatin for AUC of 5 on day 1, etoposide 100 mg/M2 on days 1, 2 and 3 as well as Imfinzi 1500 mg every 3 weeks with the chemotherapy and Neulasta support. Status post 29 cycles. Starting from cycle #5 the patient has been treated with maintenance treatment with Imfinzi 1500 mg IV every 4 weeks.  She had a pathological fracture in the left humerus underwent additional radiation to this area. Last treatment was 04/21/20.  The patient recently had a restaging CT scan performed.  Dr. Julien Nordmann personally and independently reviewed the scan and discussed the results with the patient today.  The scan showed no evidence of disease progression.  Dr. Julien Nordmann recommend that she continue on the same treatment at the same dose.  She will proceed with cycle #30 today scheduled. We will see her back for follow-up visit in 4 weeks for evaluation before starting cycle #31.  For her peripheral neuropathy, I discussed that the patient may  increase her gabapentin to 200 mg twice daily.  The patient was advised to call immediately if she has any concerning symptoms in the interval. The patient voices understanding of current disease status and treatment options and is in agreement with the current care plan. All questions were answered. The patient knows to call the clinic with any problems, questions or concerns. We can certainly see the patient much sooner if necessary      Orders Placed This Encounter  Procedures   TSH    Standing Status:   Standing    Number of Occurrences:   20    Standing Expiration Date:   05/15/2022      Tobe Sos Whittley Carandang, PA-C 05/15/21  ADDENDUM: Hematology/Oncology Attending: I had a face-to-face encounter with the patient today.  I reviewed her records, lab and scan and recommended her care plan.  This is a very pleasant 80 years old white female with extensive stage small cell lung cancer diagnosed in May 2020.  The patient started systemic chemotherapy initially with carboplatin, etoposide and Imfinzi for 4 cycles followed by maintenance treatment of Imfinzi for 25 more cycles.  She has been tolerating her treatment well with no concerning adverse effect except for fatigue.  The patient  had repeat CT scan of the chest, abdomen and pelvis performed recently.  I personally and independently reviewed the scan and discussed the results with the patient today. Her scan showed no concerning findings for disease progression. I recommended for the patient to continue her current treatment with maintenance Imfinzi and she will proceed with cycle #30 today. The patient will come back for follow-up visit in 4 weeks for evaluation before the next cycle of her treatment. For the peripheral neuropathy, she will continue her current treatment with gabapentin but we will increase the dose to 200 mg p.o. twice daily. The patient was advised to call immediately if she has any other concerning symptoms in  the interval.  Disclaimer: This note was dictated with voice recognition software. Similar sounding words can inadvertently be transcribed and may be missed upon review. Eilleen Kempf, MD 05/15/21

## 2021-05-12 ENCOUNTER — Encounter: Payer: Self-pay | Admitting: Internal Medicine

## 2021-05-12 ENCOUNTER — Encounter (HOSPITAL_COMMUNITY): Payer: Self-pay

## 2021-05-12 ENCOUNTER — Ambulatory Visit (HOSPITAL_COMMUNITY)
Admission: RE | Admit: 2021-05-12 | Discharge: 2021-05-12 | Disposition: A | Discharge status: Home / Self Care | Payer: Federal, State, Local not specified - PPO | Source: Ambulatory Visit | Attending: Internal Medicine | Admitting: Internal Medicine

## 2021-05-12 ENCOUNTER — Encounter: Payer: Self-pay | Admitting: Oncology

## 2021-05-12 ENCOUNTER — Other Ambulatory Visit: Payer: Self-pay

## 2021-05-12 DIAGNOSIS — C3490 Malignant neoplasm of unspecified part of unspecified bronchus or lung: Secondary | ICD-10-CM | POA: Insufficient documentation

## 2021-05-12 MED ORDER — HEPARIN SOD (PORK) LOCK FLUSH 100 UNIT/ML IV SOLN
500.0000 [IU] | Freq: Once | INTRAVENOUS | Status: AC
  Administered 2021-05-12: 500 [IU] via INTRAVENOUS

## 2021-05-12 MED ORDER — IOHEXOL 350 MG/ML SOLN
80.0000 mL | Freq: Once | INTRAVENOUS | Status: AC | PRN
  Administered 2021-05-12: 80 mL via INTRAVENOUS

## 2021-05-12 MED ORDER — HEPARIN SOD (PORK) LOCK FLUSH 100 UNIT/ML IV SOLN
INTRAVENOUS | Status: AC
  Filled 2021-05-12: qty 5

## 2021-05-15 ENCOUNTER — Ambulatory Visit: Payer: Federal, State, Local not specified - PPO | Admitting: Physician Assistant

## 2021-05-15 ENCOUNTER — Other Ambulatory Visit: Payer: Self-pay

## 2021-05-15 ENCOUNTER — Other Ambulatory Visit: Payer: Federal, State, Local not specified - PPO

## 2021-05-15 ENCOUNTER — Ambulatory Visit: Payer: Federal, State, Local not specified - PPO

## 2021-05-15 ENCOUNTER — Inpatient Hospital Stay: Payer: Federal, State, Local not specified - PPO

## 2021-05-15 ENCOUNTER — Inpatient Hospital Stay: Payer: Federal, State, Local not specified - PPO | Attending: Oncology | Admitting: Physician Assistant

## 2021-05-15 VITALS — BP 153/66 | HR 69 | Temp 97.5°F | Resp 18 | Ht 62.0 in | Wt 141.6 lb

## 2021-05-15 DIAGNOSIS — I2510 Atherosclerotic heart disease of native coronary artery without angina pectoris: Secondary | ICD-10-CM | POA: Diagnosis not present

## 2021-05-15 DIAGNOSIS — Z1211 Encounter for screening for malignant neoplasm of colon: Secondary | ICD-10-CM

## 2021-05-15 DIAGNOSIS — Z9221 Personal history of antineoplastic chemotherapy: Secondary | ICD-10-CM | POA: Insufficient documentation

## 2021-05-15 DIAGNOSIS — C7951 Secondary malignant neoplasm of bone: Secondary | ICD-10-CM | POA: Diagnosis not present

## 2021-05-15 DIAGNOSIS — R590 Localized enlarged lymph nodes: Secondary | ICD-10-CM | POA: Insufficient documentation

## 2021-05-15 DIAGNOSIS — K449 Diaphragmatic hernia without obstruction or gangrene: Secondary | ICD-10-CM | POA: Diagnosis not present

## 2021-05-15 DIAGNOSIS — C778 Secondary and unspecified malignant neoplasm of lymph nodes of multiple regions: Secondary | ICD-10-CM | POA: Insufficient documentation

## 2021-05-15 DIAGNOSIS — G629 Polyneuropathy, unspecified: Secondary | ICD-10-CM | POA: Diagnosis not present

## 2021-05-15 DIAGNOSIS — R937 Abnormal findings on diagnostic imaging of other parts of musculoskeletal system: Secondary | ICD-10-CM

## 2021-05-15 DIAGNOSIS — K5900 Constipation, unspecified: Secondary | ICD-10-CM | POA: Diagnosis not present

## 2021-05-15 DIAGNOSIS — I517 Cardiomegaly: Secondary | ICD-10-CM | POA: Diagnosis not present

## 2021-05-15 DIAGNOSIS — Z5112 Encounter for antineoplastic immunotherapy: Secondary | ICD-10-CM | POA: Diagnosis not present

## 2021-05-15 DIAGNOSIS — N131 Hydronephrosis with ureteral stricture, not elsewhere classified: Secondary | ICD-10-CM | POA: Insufficient documentation

## 2021-05-15 DIAGNOSIS — Z95828 Presence of other vascular implants and grafts: Secondary | ICD-10-CM

## 2021-05-15 DIAGNOSIS — Z79899 Other long term (current) drug therapy: Secondary | ICD-10-CM | POA: Diagnosis not present

## 2021-05-15 DIAGNOSIS — C3411 Malignant neoplasm of upper lobe, right bronchus or lung: Secondary | ICD-10-CM

## 2021-05-15 DIAGNOSIS — Z923 Personal history of irradiation: Secondary | ICD-10-CM | POA: Diagnosis not present

## 2021-05-15 LAB — COMPREHENSIVE METABOLIC PANEL
ALT: 16 U/L (ref 0–44)
AST: 25 U/L (ref 15–41)
Albumin: 3.5 g/dL (ref 3.5–5.0)
Alkaline Phosphatase: 81 U/L (ref 38–126)
Anion gap: 8 (ref 5–15)
BUN: 16 mg/dL (ref 8–23)
CO2: 24 mmol/L (ref 22–32)
Calcium: 8.9 mg/dL (ref 8.9–10.3)
Chloride: 106 mmol/L (ref 98–111)
Creatinine, Ser: 0.74 mg/dL (ref 0.44–1.00)
GFR, Estimated: >60 mL/min (ref >60)
Glucose, Bld: 85 mg/dL (ref 70–99)
Potassium: 4.1 mmol/L (ref 3.5–5.1)
Sodium: 138 mmol/L (ref 135–145)
Total Bilirubin: 0.4 mg/dL (ref 0.3–1.2)
Total Protein: 6.9 g/dL (ref 6.5–8.1)

## 2021-05-15 LAB — CBC WITH DIFFERENTIAL/PLATELET
Abs Immature Granulocytes: 0.02 10*3/uL (ref 0.00–0.07)
Basophils Absolute: 0 10*3/uL (ref 0.0–0.1)
Basophils Relative: 0 %
Eosinophils Absolute: 0.2 10*3/uL (ref 0.0–0.5)
Eosinophils Relative: 3 %
HCT: 33.3 % — ABNORMAL LOW (ref 36.0–46.0)
Hemoglobin: 11.6 g/dL — ABNORMAL LOW (ref 12.0–15.0)
Immature Granulocytes: 0 %
Lymphocytes Relative: 22 %
Lymphs Abs: 1.8 10*3/uL (ref 0.7–4.0)
MCH: 32.4 pg (ref 26.0–34.0)
MCHC: 34.8 g/dL (ref 30.0–36.0)
MCV: 93 fL (ref 80.0–100.0)
Monocytes Absolute: 0.7 10*3/uL (ref 0.1–1.0)
Monocytes Relative: 9 %
Neutro Abs: 5.3 10*3/uL (ref 1.7–7.7)
Neutrophils Relative %: 66 %
Platelets: 219 10*3/uL (ref 150–400)
RBC: 3.58 MIL/uL — ABNORMAL LOW (ref 3.87–5.11)
RDW: 13.1 % (ref 11.5–15.5)
WBC: 8 10*3/uL (ref 4.0–10.5)
nRBC: 0 % (ref 0.0–0.2)

## 2021-05-15 MED ORDER — SODIUM CHLORIDE 0.9 % IV SOLN
Freq: Once | INTRAVENOUS | Status: AC
  Filled 2021-05-15: qty 250

## 2021-05-15 MED ORDER — SODIUM CHLORIDE 0.9% FLUSH
10.0000 mL | INTRAVENOUS | Status: DC | PRN
  Administered 2021-05-15: 10 mL
  Filled 2021-05-15: qty 10

## 2021-05-15 MED ORDER — HEPARIN SOD (PORK) LOCK FLUSH 100 UNIT/ML IV SOLN
500.0000 [IU] | Freq: Once | INTRAVENOUS | Status: AC | PRN
  Administered 2021-05-15: 500 [IU]
  Filled 2021-05-15: qty 5

## 2021-05-15 MED ORDER — SODIUM CHLORIDE 0.9 % IV SOLN
1500.0000 mg | Freq: Once | INTRAVENOUS | Status: AC
  Administered 2021-05-15: 1500 mg via INTRAVENOUS
  Filled 2021-05-15: qty 30

## 2021-06-02 IMAGING — CT CT ABD-PELV W/ CM
3 of 5 series · 14 of 36 positions shown, 17 images · IV contrast (APPLIED)
Comparison: 11/24/2020

CLINICAL DATA: Follow-up small cell lung carcinoma.  Restaging.

EXAM:
CT CHEST, ABDOMEN, AND PELVIS WITH CONTRAST
TECHNIQUE: Multidetector CT imaging of the chest, abdomen and pelvis was
performed following the standard protocol during bolus
administration of intravenous contrast.
CONTRAST:  75mL OMNIPAQUE IOHEXOL 300 MG/ML  SOLN

[Series 2: cap with · axial · 0.98mm/px · z∈[+1116,+1571]mm · 9 of 115 slices shown, 12 images]
[im 12/115  mediastinal]
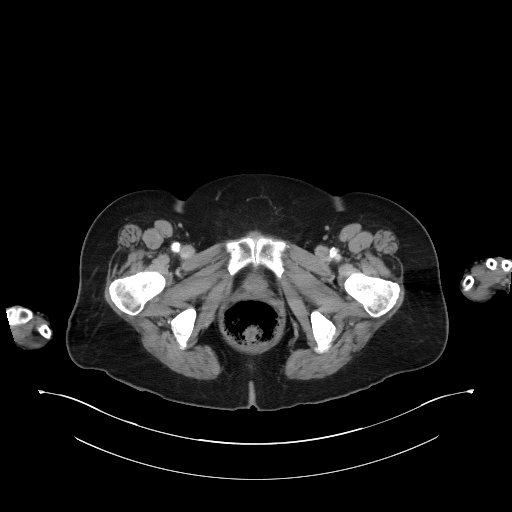
[im 12/115  lung]
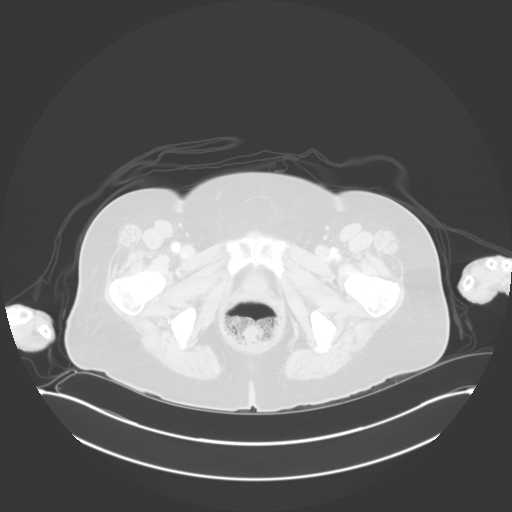
[im 23/115  lung]
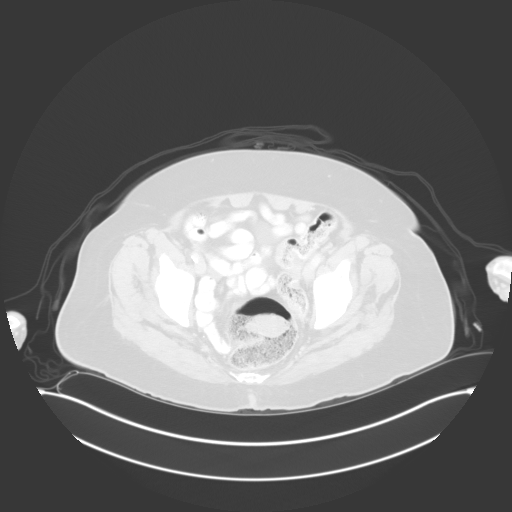
[im 35/115  lung]
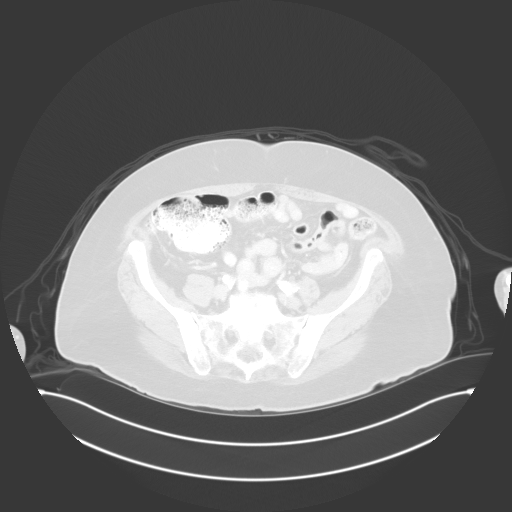
[im 46/115  lung]
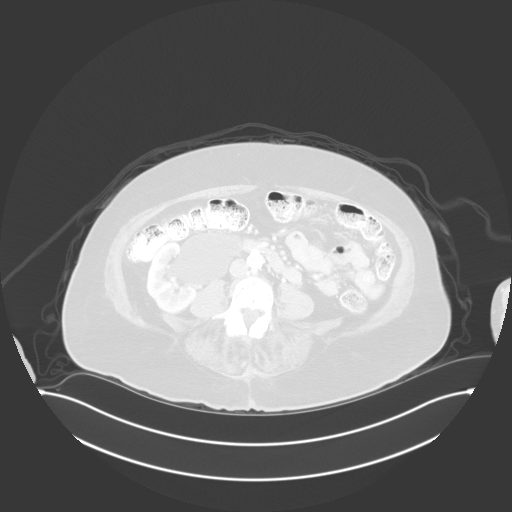
[im 58/115  mediastinal]
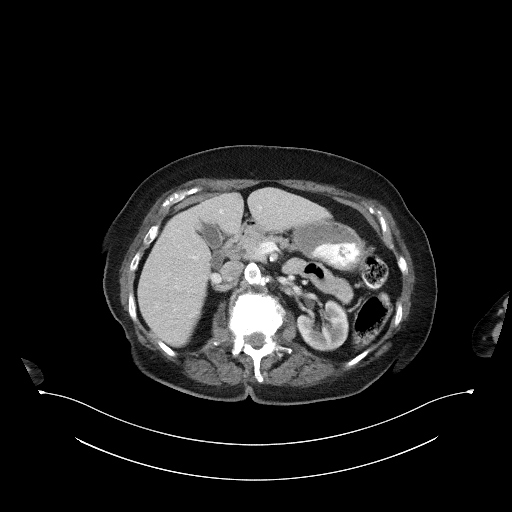
[im 58/115  lung]
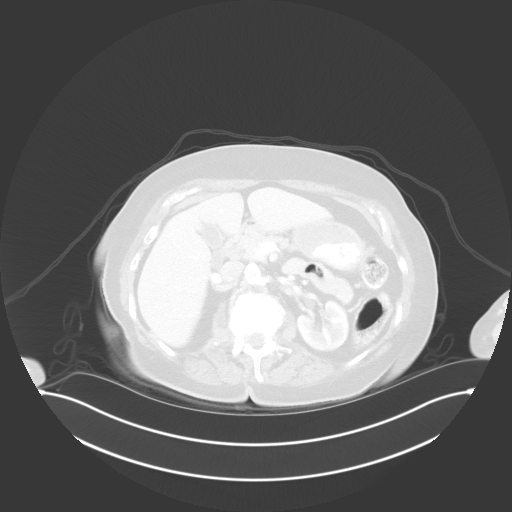
[im 69/115  lung]
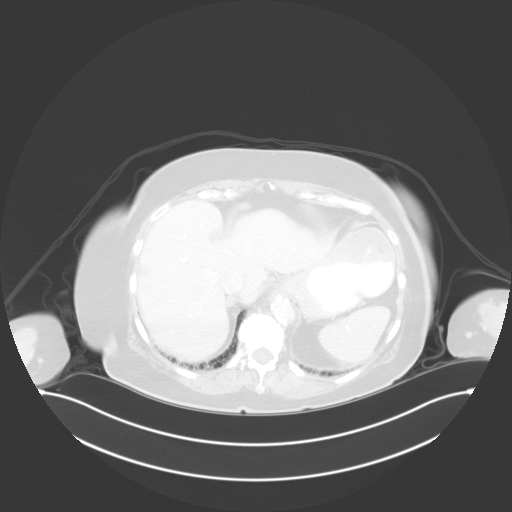
[im 80/115  lung]
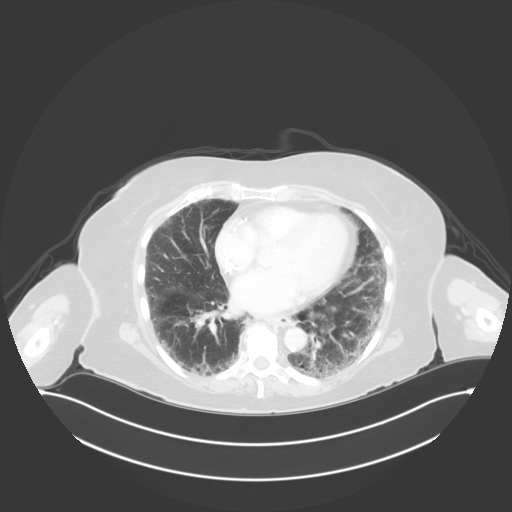
[im 92/115  lung]
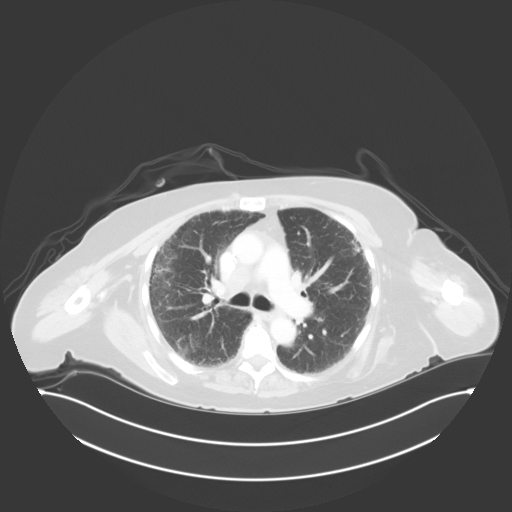
[im 103/115  mediastinal]
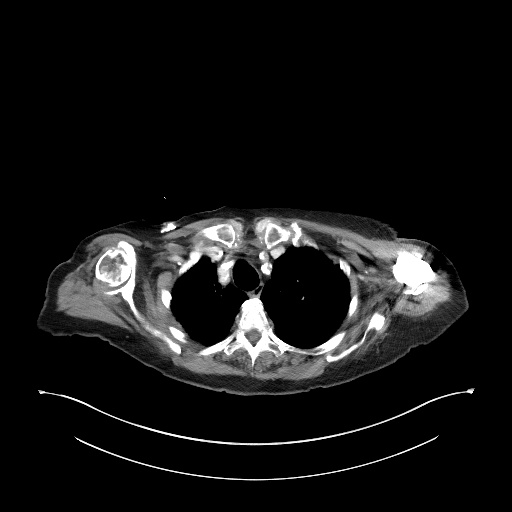
[im 103/115  lung]
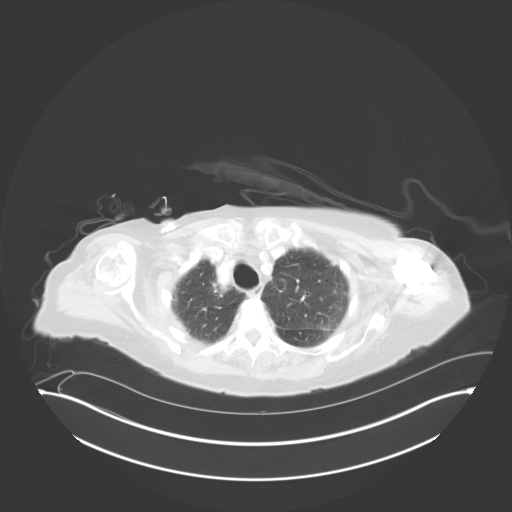

[Series 4: lung · axial · 0.75mm/px · z∈[+1367,+1411]mm · 2 of 143 slices shown]
[im 11/143  lung]
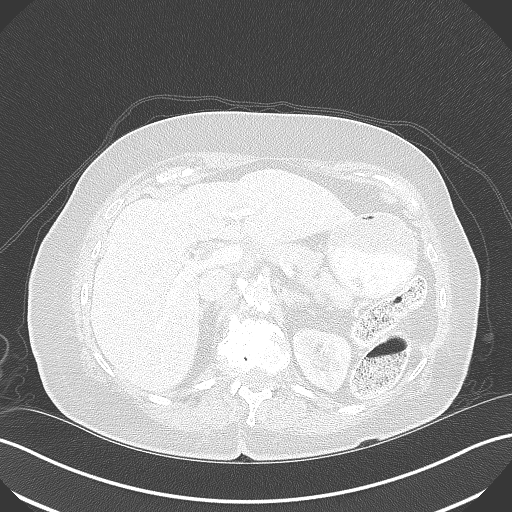
[im 33/143  lung]
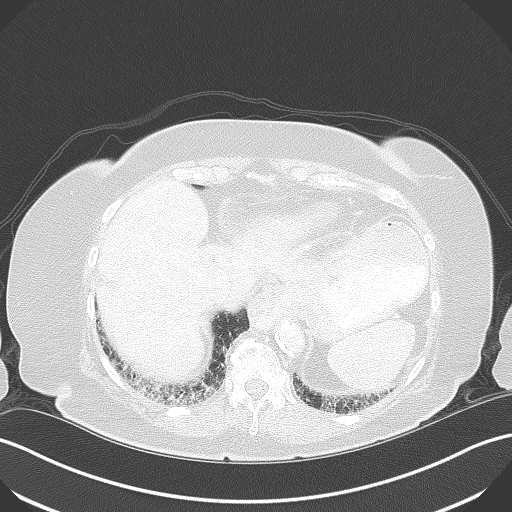

[Series 5: coronals · coronal · 0.82mm/px · 3 of 157 slices shown]
[im 32/157  lung]
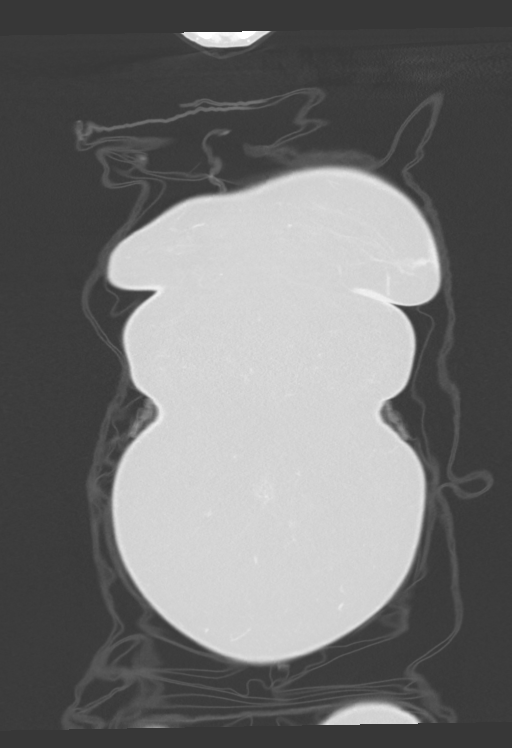
[im 63/157  lung]
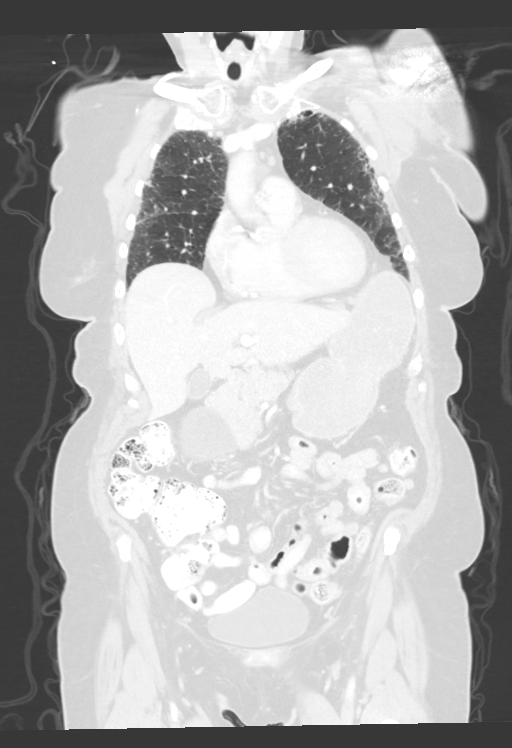
[im 94/157  lung]
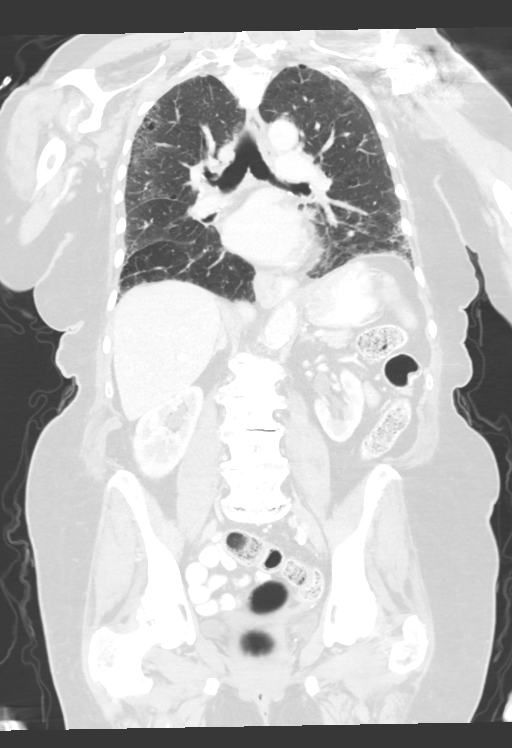

[14 of 36 positions shown; findings below may reference images not displayed]

FINDINGS: CT CHEST FINDINGS

Cardiovascular: No acute findings.

Mediastinum/Lymph Nodes: Mild mediastinal lymphadenopathy is again
seen in the pretracheal and right paratracheal regions, without
significant change. Largest index lymph node in the right
paratracheal region measures 1.7 cm on image [DATE]. No new or
increased sites of lymphadenopathy identified.

Lungs/Pleura: No suspicious pulmonary nodules or masses identified.
Stable appearance of chronic interstitial lung disease with
subpleural honeycombing consistent with fibrosis.
Pleural-parenchymal scarring in both lungs is also stable in
appearance. No acute pulmonary infiltrate or pleural effusion
identified.

Musculoskeletal: No suspicious bone lesions identified. Left
shoulder prosthesis again seen as well as end-stage right
glenohumeral osteoarthritis.

CT ABDOMEN AND PELVIS FINDINGS

Hepatobiliary: No masses identified. Gallbladder is unremarkable. No
evidence of biliary ductal dilatation.

Pancreas:  No mass or inflammatory changes.

Spleen:  Within normal limits in size and appearance.

Adrenals/Urinary tract: Normal adrenal glands. No renal masses
identified. Severe right renal pelvicaliectasis is stable and there
is no evidence of ureteral dilatation or urolithiasis.

Stomach/Bowel: Small hiatal hernia remains stable. No evidence of
obstruction, inflammatory process, or abnormal fluid collections.
Normal appendix visualized.

Vascular/Lymphatic: No pathologically enlarged lymph nodes
identified. No acute vascular findings. Aortic atherosclerotic
calcification noted.

Reproductive: Prior hysterectomy noted. Adnexal regions are
unremarkable in appearance.

Other:  None.

Musculoskeletal:  No suspicious bone lesions identified.
IMPRESSION: Stable mild mediastinal lymphadenopathy. No new or progressive
disease within the chest, abdomen, or pelvis.

Stable chronic interstitial lung disease with subpleural fibrosis.

Stable severe right renal pelvicaliectasis. No evidence of ureteral
dilatation or urolithiasis. Congenital partial UPJ obstruction
cannot be excluded.

Stable small hiatal hernia.

Aortic Atherosclerosis ([id number]-XHH.H).

## 2021-06-13 ENCOUNTER — Inpatient Hospital Stay: Payer: Federal, State, Local not specified - PPO | Attending: Oncology | Admitting: Internal Medicine

## 2021-06-13 ENCOUNTER — Ambulatory Visit: Payer: Federal, State, Local not specified - PPO

## 2021-06-13 ENCOUNTER — Other Ambulatory Visit: Payer: Self-pay | Admitting: Internal Medicine

## 2021-06-13 ENCOUNTER — Ambulatory Visit: Payer: Federal, State, Local not specified - PPO | Admitting: Internal Medicine

## 2021-06-13 ENCOUNTER — Other Ambulatory Visit: Payer: Federal, State, Local not specified - PPO

## 2021-06-13 ENCOUNTER — Inpatient Hospital Stay: Payer: Federal, State, Local not specified - PPO

## 2021-06-13 ENCOUNTER — Other Ambulatory Visit: Payer: Self-pay

## 2021-06-13 VITALS — BP 114/69 | HR 71 | Temp 96.7°F | Resp 18 | Wt 139.1 lb

## 2021-06-13 DIAGNOSIS — Z5112 Encounter for antineoplastic immunotherapy: Secondary | ICD-10-CM | POA: Diagnosis not present

## 2021-06-13 DIAGNOSIS — C3411 Malignant neoplasm of upper lobe, right bronchus or lung: Secondary | ICD-10-CM

## 2021-06-13 DIAGNOSIS — K5900 Constipation, unspecified: Secondary | ICD-10-CM | POA: Insufficient documentation

## 2021-06-13 DIAGNOSIS — Z79899 Other long term (current) drug therapy: Secondary | ICD-10-CM | POA: Diagnosis not present

## 2021-06-13 DIAGNOSIS — Z1211 Encounter for screening for malignant neoplasm of colon: Secondary | ICD-10-CM

## 2021-06-13 DIAGNOSIS — I10 Essential (primary) hypertension: Secondary | ICD-10-CM | POA: Insufficient documentation

## 2021-06-13 DIAGNOSIS — G629 Polyneuropathy, unspecified: Secondary | ICD-10-CM | POA: Diagnosis not present

## 2021-06-13 DIAGNOSIS — R937 Abnormal findings on diagnostic imaging of other parts of musculoskeletal system: Secondary | ICD-10-CM

## 2021-06-13 DIAGNOSIS — Z95828 Presence of other vascular implants and grafts: Secondary | ICD-10-CM

## 2021-06-13 LAB — COMPREHENSIVE METABOLIC PANEL
ALT: 14 U/L (ref 0–44)
AST: 20 U/L (ref 15–41)
Albumin: 3.5 g/dL (ref 3.5–5.0)
Alkaline Phosphatase: 71 U/L (ref 38–126)
Anion gap: 8 (ref 5–15)
BUN: 19 mg/dL (ref 8–23)
CO2: 25 mmol/L (ref 22–32)
Calcium: 9.2 mg/dL (ref 8.9–10.3)
Chloride: 105 mmol/L (ref 98–111)
Creatinine, Ser: 0.79 mg/dL (ref 0.44–1.00)
GFR, Estimated: >60 mL/min (ref >60)
Glucose, Bld: 140 mg/dL — ABNORMAL HIGH (ref 70–99)
Potassium: 3.8 mmol/L (ref 3.5–5.1)
Sodium: 138 mmol/L (ref 135–145)
Total Bilirubin: 0.4 mg/dL (ref 0.3–1.2)
Total Protein: 6.8 g/dL (ref 6.5–8.1)

## 2021-06-13 LAB — CBC WITH DIFFERENTIAL/PLATELET
Abs Immature Granulocytes: 0.02 10*3/uL (ref 0.00–0.07)
Basophils Absolute: 0 10*3/uL (ref 0.0–0.1)
Basophils Relative: 0 %
Eosinophils Absolute: 0.2 10*3/uL (ref 0.0–0.5)
Eosinophils Relative: 4 %
HCT: 35.3 % — ABNORMAL LOW (ref 36.0–46.0)
Hemoglobin: 12 g/dL (ref 12.0–15.0)
Immature Granulocytes: 0 %
Lymphocytes Relative: 22 %
Lymphs Abs: 1.4 10*3/uL (ref 0.7–4.0)
MCH: 32.1 pg (ref 26.0–34.0)
MCHC: 34 g/dL (ref 30.0–36.0)
MCV: 94.4 fL (ref 80.0–100.0)
Monocytes Absolute: 0.6 10*3/uL (ref 0.1–1.0)
Monocytes Relative: 10 %
Neutro Abs: 4.1 10*3/uL (ref 1.7–7.7)
Neutrophils Relative %: 64 %
Platelets: 232 10*3/uL (ref 150–400)
RBC: 3.74 MIL/uL — ABNORMAL LOW (ref 3.87–5.11)
RDW: 12.9 % (ref 11.5–15.5)
WBC: 6.3 10*3/uL (ref 4.0–10.5)
nRBC: 0 % (ref 0.0–0.2)

## 2021-06-13 LAB — TSH: TSH: 1.128 u[IU]/mL (ref 0.308–3.960)

## 2021-06-13 MED ORDER — SODIUM CHLORIDE 0.9% FLUSH
10.0000 mL | INTRAVENOUS | Status: DC | PRN
  Administered 2021-06-13: 10 mL

## 2021-06-13 MED ORDER — SODIUM CHLORIDE 0.9 % IV SOLN
1500.0000 mg | Freq: Once | INTRAVENOUS | Status: AC
  Administered 2021-06-13: 1500 mg via INTRAVENOUS
  Filled 2021-06-13: qty 30

## 2021-06-13 MED ORDER — SODIUM CHLORIDE 0.9 % IV SOLN: Freq: Once | INTRAVENOUS | Status: AC

## 2021-06-13 MED ORDER — HEPARIN SOD (PORK) LOCK FLUSH 100 UNIT/ML IV SOLN
500.0000 [IU] | Freq: Once | INTRAVENOUS | Status: AC | PRN
Start: 2021-06-13 — End: 2021-06-13
  Administered 2021-06-13: 500 [IU]

## 2021-06-13 NOTE — Progress Notes (Signed)
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   PRIOR THERAPY: None   CURRENT THERAPY: Palliative systemic chemotherapy with carboplatin for AUC of 5 on day 1, etoposide 100 mg/M2 on days 1, 2 and 3 as well as Imfinzi 1500 mg every 3 weeks with the chemotherapy and Neulasta support. First dose on 03/16/2019. Status post 30  cycles.   Starting from cycle #5 the patient will be treated with maintenance treatment with Imfinzi 1500 mg IV every 4 weeks.  INTERVAL HISTORY: Brittany Marks 80 y.o. female returns to the clinic today for follow-up visit.  The patient is feeling fine today with no concerning complaints except for the persistent peripheral neuropathy.  She started treatment with gabapentin and its helping some.  She is requesting refill of her medication.  She denied having any current chest pain but has shortness of breath with exertion with no cough or hemoptysis.  She denied having any nausea, vomiting, diarrhea or constipation.  She has no headache or visual changes.  She is here today for evaluation before starting cycle #31 of her treatment.  MEDICAL HISTORY: Past Medical History:  Diagnosis Date   Cataract    Bilateral   Constipation    pain medication   Left rotator cuff tear    Right rotator cuff tear    SCL CA dx'd 11/2018    ALLERGIES:  has No Known Allergies.  MEDICATIONS:  Current Outpatient Medications  Medication Sig Dispense Refill   Ascorbic Acid (VITAMIN C PO) Take by mouth daily.      b complex vitamins tablet Take 1 tablet by mouth daily.     COD LIVER OIL PO Take 1 tablet by  mouth daily.      cyclobenzaprine (FLEXERIL) 10 MG tablet Take 1 tablet (10 mg total) by mouth 3 (three) times daily as needed for muscle spasms. 30 tablet 1   gabapentin (NEURONTIN) 100 MG capsule Take 1 capsule (100 mg total) by mouth 2 (two) times daily. 60 capsule 2   HYDROcodone-acetaminophen (NORCO) 5-325 MG tablet Take 1 tablet by mouth every 6 (six) hours as needed for moderate pain. 20 tablet 0   hydrocortisone 2.5 % cream Apply topically as needed. (Patient taking differently: Apply 1 application topically daily as needed (Treatment).) 3.5 g 0   ibuprofen (ADVIL) 200 MG tablet Take 600 mg by mouth daily.     lidocaine-prilocaine (EMLA) cream Apply 1 application topically every 30 (thirty) days.      Multiple Vitamin (MULTIVITAMIN) tablet Take 1 tablet by mouth daily.     ondansetron (ZOFRAN) 4 MG tablet Take 1 tablet (4 mg total) by mouth every 8 (eight) hours as needed for nausea or vomiting. 10 tablet 0   oxymetazoline (AFRIN) 0.05 % nasal spray Place 2 sprays into the nose 2 (two) times daily as needed (Bloody nose).      prochlorperazine (COMPAZINE) 10 MG tablet Take 1 tablet (10 mg total) by mouth every 6 (six) hours as needed for nausea or vomiting. 30 tablet 0   silver sulfADIAZINE (SILVADENE) 1 % cream Apply 1 application topically 2 (two) times daily. [place]  g 1   sodium chloride (OCEAN) 0.65 % nasal spray Place 1 spray into the nose at bedtime.     VITAMIN E PO Take 1 tablet by mouth daily.      No current facility-administered medications for this visit.   Facility-Administered Medications Ordered in Other Visits  Medication Dose Route Frequency Provider Last Rate Last Admin   heparin lock flush 100 unit/mL  500 Units Intracatheter Once PRN Magrinat, Virgie Dad, MD       sodium chloride flush (NS) 0.9 % injection 10 mL  10 mL Intracatheter PRN Magrinat, Virgie Dad, MD        SURGICAL HISTORY:  Past Surgical History:  Procedure Laterality Date   CATARACT EXTRACTION W/  INTRAOCULAR LENS  IMPLANT, BILATERAL  2012   DECOMPRESSIVE LUMBAR LAMINECTOMY LEVEL 2 N/A 07/29/2013   Procedure: LUMBAR LAMINECTOMY, DECOMPRESSION LUMBAR THREE TO FOUR, FOUR TO FIVE microdiscectomy l3,4 right;  Surgeon: Tobi Bastos, MD;  Location: [facility];  Service: Orthopedics;  Laterality: N/A;   I & D SUPERIOR RIGHT SHOULDER AND CLOSURE WOUND  01-10-2011   S/P ROTATOR CUFF REPAIR   IR IMAGING GUIDED PORT INSERTION  04/09/2019   LUMBAR LAMINECTOMY  1970'S   LUMBAR LAMINECTOMY/DECOMPRESSION MICRODISCECTOMY N/A 06/27/2016   Procedure: L1 - L2 DISCECTOMY;  Surgeon: Melina Schools, MD;  Location: [facility];  Service: Orthopedics;  Laterality: N/A;   LUMBAR SPINE SURGERY  1983   REVERSE SHOULDER ARTHROPLASTY Left 03/10/2020   Procedure: REVERSE SHOULDER ARTHROPLASTY;  Surgeon: Justice Britain, MD;  Location: [facility];  Service: Orthopedics;  Laterality: Left;  160min   RIGHT SHOULDER ARTHROSCOPY/ OPEN DISTAL CLAVICLE RESECTION/ SAD/ OPEN ROTATOR CUFF REPAIR  11-28-2010   SHOULDER FUSION SURGERY  03/11/2020   Shoulder Surgery , hardware placed   SHOULDER OPEN ROTATOR CUFF REPAIR  12/19/2011   Procedure: ROTATOR CUFF REPAIR SHOULDER OPEN;  Surgeon: Magnus Sinning, MD;  Location: [facility];  Service: Orthopedics;  Laterality: Right;  RIGHT RECURRENT OPEN REPAIR OF THE ROTATOR CUFF WITH TISSUE MEND GRAFTANTERIOR CHROMIOECTOMY   VAGINAL HYSTERECTOMY  1979    REVIEW OF SYSTEMS:  A comprehensive review of systems was negative except for: Constitutional: positive for fatigue Neurological: positive for paresthesia   PHYSICAL EXAMINATION: General appearance: alert, cooperative, fatigued, and no distress Head: Normocephalic, without obvious abnormality, atraumatic Neck: no adenopathy, no JVD, supple, symmetrical, trachea midline, and thyroid not enlarged, symmetric, no tenderness/mass/nodules Lymph nodes: Cervical, supraclavicular, and axillary nodes normal. Resp: clear to auscultation  bilaterally Back: symmetric, no curvature. ROM normal. No CVA tenderness. Cardio: regular rate and rhythm, S1, S2 normal, no murmur, click, rub or gallop GI: soft, non-tender; bowel sounds normal; no masses,  no organomegaly Extremities: extremities normal, atraumatic, no cyanosis or edema   ECOG PERFORMANCE STATUS: 1 - Symptomatic but completely ambulatory  Blood pressure 114/69, pulse 71, temperature (!) 96.7 F (35.9 C), temperature source Tympanic, resp. rate 18, weight 139 lb 2 oz (63.1 kg), SpO2 95 %.  LABORATORY DATA: Lab Results  Component Value Date   WBC 6.3 06/13/2021   HGB 12.0 06/13/2021   HCT 35.3 (L) 06/13/2021   MCV 94.4 06/13/2021   PLT 232 06/13/2021      Chemistry      Component Value Date/Time   NA 138 05/15/2021 1135   K 4.1 05/15/2021 1135   CL 106 05/15/2021 1135   CO2 24 05/15/2021 1135   BUN 16 05/15/2021 1135   CREATININE 0.74 05/15/2021 1135   CREATININE  0.76 12/21/2019 1100      Component Value Date/Time   CALCIUM 8.9 05/15/2021 1135   ALKPHOS 81 05/15/2021 1135   AST 25 05/15/2021 1135   AST 22 12/21/2019 1100   ALT 16 05/15/2021 1135   ALT 13 12/21/2019 1100   BILITOT 0.4 05/15/2021 1135   BILITOT 0.3 12/21/2019 1100       RADIOGRAPHIC STUDIES: No results found.   ASSESSMENT AND PLAN: This is a very pleasant 80 years old white female with extensive stage small cell lung cancer and she is currently undergoing treatment with carboplatin, etoposide and Imfinzi status post 5 cycles.  Starting from cycle #6 the patient is on maintenance treatment with single agent Imfinzi every 4 weeks status post 30 cycles of total treatment. The patient continues to tolerate her treatment fairly well with no significant adverse effects. I recommended for her to proceed with cycle #31 today as planned. I will see her back for follow-up visit in 4 weeks for evaluation before the next cycle of her treatment. For the peripheral neuropathy, will increase her  dose of gabapentin to 200 mg p.o. twice daily. For the hypertension she was advised to take her blood pressure medications and monitor it closely at home. She was advised to call immediately if she has any other concerning symptoms in the interval. The patient voices understanding of current disease status and treatment options and is in agreement with the current care plan.  All questions were answered. The patient knows to call the clinic with any problems, questions or concerns. We can certainly see the patient much sooner if necessary.  Disclaimer: This note was dictated with voice recognition software. Similar sounding words can inadvertently be transcribed and may not be corrected upon review.

## 2021-06-13 NOTE — Patient Instructions (Signed)
[facility]  Discharge Instructions: Thank you for choosing [facility] to provide your oncology and hematology care.   If you have a lab appointment with the Cancer Center, please go directly to the Cancer Center and check in at the registration area.   Wear comfortable clothing and clothing appropriate for easy access to any Portacath or PICC line.   We strive to give you quality time with your provider. You may need to reschedule your appointment if you arrive late (15 or more minutes).  Arriving late affects you and other patients whose appointments are after yours.  Also, if you miss three or more appointments without notifying the office, you may be dismissed from the clinic at the provider's discretion.      For prescription refill requests, have your pharmacy contact our office and allow 72 hours for refills to be completed.    Today you received the following chemotherapy and/or immunotherapy agents Imfinzi      To help prevent nausea and vomiting after your treatment, we encourage you to take your nausea medication as directed.  BELOW ARE SYMPTOMS THAT SHOULD BE REPORTED IMMEDIATELY: *FEVER GREATER THAN 100.4 F (38 C) OR HIGHER *CHILLS OR SWEATING *NAUSEA AND VOMITING THAT IS NOT CONTROLLED WITH YOUR NAUSEA MEDICATION *UNUSUAL SHORTNESS OF BREATH *UNUSUAL BRUISING OR BLEEDING *URINARY PROBLEMS (pain or burning when urinating, or frequent urination) *BOWEL PROBLEMS (unusual diarrhea, constipation, pain near the anus) TENDERNESS IN MOUTH AND THROAT WITH OR WITHOUT PRESENCE OF ULCERS (sore throat, sores in mouth, or a toothache) UNUSUAL RASH, SWELLING OR PAIN  UNUSUAL VAGINAL DISCHARGE OR ITCHING   Items with * indicate a potential emergency and should be followed up as soon as possible or go to the Emergency Department if any problems should occur.  Please show the CHEMOTHERAPY ALERT CARD or IMMUNOTHERAPY ALERT CARD at check-in to the  Emergency Department and triage nurse.  Should you have questions after your visit or need to cancel or reschedule your appointment, please contact [facility]  Dept: [phone]  and follow the prompts.  Office hours are 8:00 a.m. to 4:30 p.m. Monday - Friday. Please note that voicemails left after 4:00 p.m. may not be returned until the following business day.  We are closed weekends and major holidays. You have access to a nurse at all times for urgent questions. Please call the main number to the clinic Dept: [phone] and follow the prompts.   For any non-urgent questions, you may also contact your provider using MyChart. We now offer e-Visits for anyone 18 and older to request care online for non-urgent symptoms. For details visit [email or web address].   Also download the MyChart app! Go to the app store, search "MyChart", open the app, select [facility], and log in with your MyChart username and password.  Due to Covid, a mask is required upon entering the hospital/clinic. If you do not have a mask, one will be given to you upon arrival. For doctor visits, patients may have 1 support person aged 18 or older with them. For treatment visits, patients cannot have anyone with them due to current Covid guidelines and our immunocompromised population.   

## 2021-06-14 ENCOUNTER — Telehealth: Payer: Self-pay | Admitting: Internal Medicine

## 2021-06-14 NOTE — Telephone Encounter (Signed)
Scheduled appt per 9/6 los - unable to reach pt . Left message for patient with appt date and time

## 2021-06-15 ENCOUNTER — Other Ambulatory Visit: Payer: Self-pay | Admitting: Internal Medicine

## 2021-07-03 NOTE — Progress Notes (Signed)
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 Lisbeth Renshaw. 2) Additional palliative radiotherapy to the proximal humerus under the care of Dr. Lisbeth Renshaw. Last treatment on 04/21/2020  CURRENT THERAPY: Palliative systemic chemotherapy with carboplatin for AUC of 5 on day 1, etoposide 100 mg/M2 on days 1, 2 and 3 as well as Imfinzi 1500 mg every 3 weeks with the chemotherapy and Neulasta support. First dose on 03/16/2019. Status post 31 cycles. Starting from cycle #5 the patient will be treated with maintenance treatment with Imfinzi 1500 mg IV every 4 weeks.  INTERVAL HISTORY: Brittany Marks 80 y.o. female returns to the clinic today for a follow-up visit.  The patient is feeling fairly well today without any concerning complaints except for fatigue and peripheral neuropathy. She is taking 100 mg BID of gabapentin.  The peripheral neuropathy is interfering with her ability to hold objects. The patient denies any recent fever, chills, or night sweats.  Her weight is stable.  She denies any chest pain or hemoptysis.  She reports her baseline dyspnea on exertion. She reports her chronic cough. She denies any chest pain or hemoptysis.  She denies any nausea, vomiting, diarrhea, or constipation.  She denies any headache or visual changes. She denies any rashes or skin changes except for dry flaky skin.  She is here today for evaluation and repeat blood work before starting cycle #32.   MEDICAL HISTORY: Past Medical History:  Diagnosis Date   Cataract    Bilateral   Constipation    pain  medication   Left rotator cuff tear    Right rotator cuff tear    SCL CA dx'd 11/2018    ALLERGIES:  has No Known Allergies.  MEDICATIONS:  Current Outpatient Medications  Medication Sig Dispense Refill   Ascorbic Acid (VITAMIN C PO) Take by mouth daily.      b complex vitamins tablet Take 1 tablet by mouth daily.     COD LIVER OIL PO Take 1 tablet by mouth daily.      cyclobenzaprine (FLEXERIL) 10 MG tablet Take 1 tablet (10 mg total) by mouth 3 (three) times daily as needed for muscle spasms. 30 tablet 1   gabapentin (NEURONTIN) 100 MG capsule Take 2 capsules (200 mg total) by mouth 2 (two) times daily. 90 capsule 2   HYDROcodone-acetaminophen (NORCO) 5-325 MG tablet Take 1 tablet by mouth every 6 (six) hours as needed for moderate pain. 20 tablet 0   hydrocortisone 2.5 % cream Apply topically as needed. (Patient taking differently: Apply 1 application topically daily as needed (Treatment).) 3.5 g 0   ibuprofen (ADVIL) 200 MG tablet Take 600 mg by mouth daily.     lidocaine-prilocaine (EMLA) cream Apply 1 application topically every 30 (thirty) days.      Multiple Vitamin (MULTIVITAMIN) tablet Take 1 tablet by mouth daily.     ondansetron (ZOFRAN) 4 MG tablet Take 1 tablet (4 mg total) by mouth every 8 (eight) hours as needed for nausea or vomiting. 10 tablet 0   oxymetazoline (AFRIN) 0.05 % nasal spray Place 2 sprays into the nose 2 (two)  times daily as needed (Bloody nose).      prochlorperazine (COMPAZINE) 10 MG tablet Take 1 tablet (10 mg total) by mouth every 6 (six) hours as needed for nausea or vomiting. 30 tablet 0   silver sulfADIAZINE (SILVADENE) 1 % cream Apply 1 application topically 2 (two) times daily. 50 g 1   sodium chloride (OCEAN) 0.65 % nasal spray Place 1 spray into the nose at bedtime.     VITAMIN E PO Take 1 tablet by mouth daily.      No current facility-administered medications for this visit.   Facility-Administered Medications Ordered in Other Visits   Medication Dose Route Frequency Provider Last Rate Last Admin   heparin lock flush 100 unit/mL  500 Units Intracatheter Once PRN Magrinat, Virgie Dad, MD       sodium chloride flush (NS) 0.9 % injection 10 mL  10 mL Intracatheter PRN Magrinat, Virgie Dad, MD        SURGICAL HISTORY:  Past Surgical History:  Procedure Laterality Date   CATARACT EXTRACTION W/ INTRAOCULAR LENS  IMPLANT, BILATERAL  2012   DECOMPRESSIVE LUMBAR LAMINECTOMY LEVEL 2 N/A 07/29/2013   Procedure: LUMBAR LAMINECTOMY, DECOMPRESSION LUMBAR THREE TO FOUR, FOUR TO FIVE microdiscectomy l3,4 right;  Surgeon: Tobi Bastos, MD;  Location: [facility];  Service: Orthopedics;  Laterality: N/A;   I & D SUPERIOR RIGHT SHOULDER AND CLOSURE WOUND  01-10-2011   S/P ROTATOR CUFF REPAIR   IR IMAGING GUIDED PORT INSERTION  04/09/2019   LUMBAR LAMINECTOMY  1970'S   LUMBAR LAMINECTOMY/DECOMPRESSION MICRODISCECTOMY N/A 06/27/2016   Procedure: L1 - L2 DISCECTOMY;  Surgeon: Melina Schools, MD;  Location: [facility];  Service: Orthopedics;  Laterality: N/A;   LUMBAR SPINE SURGERY  1983   REVERSE SHOULDER ARTHROPLASTY Left 03/10/2020   Procedure: REVERSE SHOULDER ARTHROPLASTY;  Surgeon: Justice Britain, MD;  Location: [facility];  Service: Orthopedics;  Laterality: Left;  169min   RIGHT SHOULDER ARTHROSCOPY/ OPEN DISTAL CLAVICLE RESECTION/ SAD/ OPEN ROTATOR CUFF REPAIR  11-28-2010   SHOULDER FUSION SURGERY  03/11/2020   Shoulder Surgery , hardware placed   SHOULDER OPEN ROTATOR CUFF REPAIR  12/19/2011   Procedure: ROTATOR CUFF REPAIR SHOULDER OPEN;  Surgeon: Magnus Sinning, MD;  Location: [facility];  Service: Orthopedics;  Laterality: Right;  RIGHT RECURRENT OPEN REPAIR OF THE ROTATOR CUFF WITH TISSUE MEND GRAFTANTERIOR CHROMIOECTOMY   VAGINAL HYSTERECTOMY  1979    REVIEW OF SYSTEMS:   Constitutional: Positive for fatigue.  Negative for appetite change, chills, fever and unexpected weight change.  HENT:   Negative for mouth sores,  nosebleeds, sore throat and trouble swallowing.   Eyes: Negative for eye problems and icterus.  Respiratory: Positive for stable dyspnea on exertion and chronic cough.  Negative for hemoptysis and wheezing.   Cardiovascular: Negative for chest pain and leg swelling.  Gastrointestinal: Positive for diarrhea following oral contrast for CT scan. Negative for abdominal pain, constipation, diarrhea, nausea and vomiting.  Genitourinary: Negative for bladder incontinence, difficulty urinating, dysuria, frequency and hematuria.   Musculoskeletal: Negative for back pain, gait problem, neck pain and neck stiffness.  Skin: Negative for itching and rash.  Neurological: Negative for dizziness, extremity weakness, gait problem, headaches, light-headedness and seizures.  Hematological: Negative for adenopathy. Does not bruise/bleed easily.  Psychiatric/Behavioral: Negative for confusion, depression and sleep disturbance. The patient is not nervous/anxious.     PHYSICAL EXAMINATION:  Blood pressure (!) 149/67, pulse 72, temperature 98.4 F (36.9 C), temperature source Tympanic, resp. rate 19,  height 5\' 2"  (1.575 m), weight 138 lb 9.6 oz (62.9 kg), SpO2 97 %.  ECOG PERFORMANCE STATUS: 2-3  Physical Exam  Constitutional: Oriented to person, place, and time and well-developed, well-nourished, and in no distress. HENT: Head: Normocephalic and atraumatic. Mouth/Throat: Oropharynx is clear and moist. No oropharyngeal exudate. Eyes: Conjunctivae are normal. Right eye exhibits no discharge. Left eye exhibits no discharge. No scleral icterus. Neck: Normal range of motion. Neck supple. Cardiovascular: Normal rate, regular rhythm, normal heart sounds and intact distal pulses.   Pulmonary/Chest: Effort normal and breath sounds normal except crackles noted bilaterally in the lung bases. No respiratory distress. No wheezes. Abdominal: Soft. Bowel sounds are normal. Exhibits no distension and no mass. There is no  tenderness.  Musculoskeletal: Normal range of motion. Exhibits no edema.  Lymphadenopathy:    No cervical adenopathy.  Neurological: Alert and oriented to person, place, and time. Exhibits muscle wasting.  The patient was examined in the wheelchair.  Skin: No vesicles. Dry skin. Skin is warm and dry. Not diaphoretic. No pallor.  Psychiatric: Mood, memory and judgment normal. Vitals reviewed.  LABORATORY DATA: Lab Results  Component Value Date   WBC 8.2 07/10/2021   HGB 11.9 (L) 07/10/2021   HCT 34.4 (L) 07/10/2021   MCV 93.2 07/10/2021   PLT 214 07/10/2021      Chemistry      Component Value Date/Time   NA 138 07/10/2021 1128   K 3.9 07/10/2021 1128   CL 105 07/10/2021 1128   CO2 23 07/10/2021 1128   BUN 24 (H) 07/10/2021 1128   CREATININE 0.82 07/10/2021 1128   CREATININE 0.76 12/21/2019 1100      Component Value Date/Time   CALCIUM 8.9 07/10/2021 1128   ALKPHOS 80 07/10/2021 1128   AST 23 07/10/2021 1128   AST 22 12/21/2019 1100   ALT 15 07/10/2021 1128   ALT 13 12/21/2019 1100   BILITOT 0.3 07/10/2021 1128   BILITOT 0.3 12/21/2019 1100       RADIOGRAPHIC STUDIES:  No results found.   ASSESSMENT/PLAN:  This is a very pleasant 80 year old Caucasian female diagnosed with extensive stage (T1b, N3, M1 C) small cell lung cancer.  She presented with right upper lobe pulmonary nodules in addition to bilateral hilar and right mediastinal as well as left supraclavicular and abdominal lymphadenopathy.  She also has metastatic disease to the in the left humerus.  She was diagnosed in May 2020.   She is status post palliative radiotherapy to the left proximal humerus under the care of Dr. Lisbeth Renshaw in 2020.   She is currently undergoing palliative systemic chemotherapy with carboplatin for AUC of 5 on day 1, etoposide 100 mg/M2 on days 1, 2 and 3 as well as Imfinzi 1500 mg every 3 weeks with the chemotherapy and Neulasta support. Status post 31 cycles. Starting from cycle #5  the patient has been treated with maintenance treatment with Imfinzi 1500 mg IV every 4 weeks.   She had a pathological fracture in the left humerus underwent additional radiation to this area. Last treatment was 04/21/20.   Labs were reviewed.  Recommend that she proceed with cycle #32 today scheduled.  I was planning on arranging a restaging CT scan the chest, abdomen, and pelvis prior to starting her next cycle of treatment. The patient is refusing to have this this month due to the diarrhea. I reviewed with Dr. Julien Nordmann and we will delay this by another month. We will order this at her next appointment.  I have refilled her gabapentin. I have increased the dose to 200 mg BID.   We will see her back for follow-up visit in 4 weeks for evaluation before starting cycle #33.  The patient was advised to call immediately if she has any concerning symptoms in the interval. The patient voices understanding of current disease status and treatment options and is in agreement with the current care plan. All questions were answered. The patient knows to call the clinic with any problems, questions or concerns. We can certainly see the patient much sooner if necessary        No orders of the defined types were placed in this encounter.    The total time spent in the appointment was 20-29 minutes.  Shadow [employer]iggers L Suheyla Mortellaro, PA-C 07/10/21

## 2021-07-10 ENCOUNTER — Inpatient Hospital Stay: Payer: Federal, State, Local not specified - PPO | Attending: Oncology

## 2021-07-10 ENCOUNTER — Encounter: Payer: Self-pay | Admitting: Physician Assistant

## 2021-07-10 ENCOUNTER — Other Ambulatory Visit: Payer: Self-pay

## 2021-07-10 ENCOUNTER — Inpatient Hospital Stay (HOSPITAL_BASED_OUTPATIENT_CLINIC_OR_DEPARTMENT_OTHER): Payer: Federal, State, Local not specified - PPO | Admitting: Physician Assistant

## 2021-07-10 ENCOUNTER — Inpatient Hospital Stay: Payer: Federal, State, Local not specified - PPO

## 2021-07-10 VITALS — BP 149/67 | HR 72 | Temp 98.4°F | Resp 19 | Ht 62.0 in | Wt 138.6 lb

## 2021-07-10 DIAGNOSIS — Z95828 Presence of other vascular implants and grafts: Secondary | ICD-10-CM

## 2021-07-10 DIAGNOSIS — C3411 Malignant neoplasm of upper lobe, right bronchus or lung: Secondary | ICD-10-CM | POA: Insufficient documentation

## 2021-07-10 DIAGNOSIS — Z79899 Other long term (current) drug therapy: Secondary | ICD-10-CM | POA: Insufficient documentation

## 2021-07-10 DIAGNOSIS — Z9221 Personal history of antineoplastic chemotherapy: Secondary | ICD-10-CM | POA: Diagnosis not present

## 2021-07-10 DIAGNOSIS — Z923 Personal history of irradiation: Secondary | ICD-10-CM | POA: Diagnosis not present

## 2021-07-10 DIAGNOSIS — R937 Abnormal findings on diagnostic imaging of other parts of musculoskeletal system: Secondary | ICD-10-CM

## 2021-07-10 DIAGNOSIS — C7951 Secondary malignant neoplasm of bone: Secondary | ICD-10-CM | POA: Diagnosis not present

## 2021-07-10 DIAGNOSIS — Z5112 Encounter for antineoplastic immunotherapy: Secondary | ICD-10-CM

## 2021-07-10 DIAGNOSIS — R5383 Other fatigue: Secondary | ICD-10-CM | POA: Diagnosis not present

## 2021-07-10 DIAGNOSIS — C7801 Secondary malignant neoplasm of right lung: Secondary | ICD-10-CM | POA: Insufficient documentation

## 2021-07-10 DIAGNOSIS — G6289 Other specified polyneuropathies: Secondary | ICD-10-CM

## 2021-07-10 DIAGNOSIS — C7802 Secondary malignant neoplasm of left lung: Secondary | ICD-10-CM | POA: Diagnosis not present

## 2021-07-10 DIAGNOSIS — Z1211 Encounter for screening for malignant neoplasm of colon: Secondary | ICD-10-CM

## 2021-07-10 DIAGNOSIS — G629 Polyneuropathy, unspecified: Secondary | ICD-10-CM | POA: Insufficient documentation

## 2021-07-10 LAB — COMPREHENSIVE METABOLIC PANEL
ALT: 15 U/L (ref 0–44)
AST: 23 U/L (ref 15–41)
Albumin: 3.6 g/dL (ref 3.5–5.0)
Alkaline Phosphatase: 80 U/L (ref 38–126)
Anion gap: 10 (ref 5–15)
BUN: 24 mg/dL — ABNORMAL HIGH (ref 8–23)
CO2: 23 mmol/L (ref 22–32)
Calcium: 8.9 mg/dL (ref 8.9–10.3)
Chloride: 105 mmol/L (ref 98–111)
Creatinine, Ser: 0.82 mg/dL (ref 0.44–1.00)
GFR, Estimated: >60 mL/min (ref >60)
Glucose, Bld: 107 mg/dL — ABNORMAL HIGH (ref 70–99)
Potassium: 3.9 mmol/L (ref 3.5–5.1)
Sodium: 138 mmol/L (ref 135–145)
Total Bilirubin: 0.3 mg/dL (ref 0.3–1.2)
Total Protein: 7.1 g/dL (ref 6.5–8.1)

## 2021-07-10 LAB — CBC WITH DIFFERENTIAL/PLATELET
Abs Immature Granulocytes: 0.03 10*3/uL (ref 0.00–0.07)
Basophils Absolute: 0 10*3/uL (ref 0.0–0.1)
Basophils Relative: 0 %
Eosinophils Absolute: 0.2 10*3/uL (ref 0.0–0.5)
Eosinophils Relative: 3 %
HCT: 34.4 % — ABNORMAL LOW (ref 36.0–46.0)
Hemoglobin: 11.9 g/dL — ABNORMAL LOW (ref 12.0–15.0)
Immature Granulocytes: 0 %
Lymphocytes Relative: 24 %
Lymphs Abs: 1.9 10*3/uL (ref 0.7–4.0)
MCH: 32.2 pg (ref 26.0–34.0)
MCHC: 34.6 g/dL (ref 30.0–36.0)
MCV: 93.2 fL (ref 80.0–100.0)
Monocytes Absolute: 0.8 10*3/uL (ref 0.1–1.0)
Monocytes Relative: 9 %
Neutro Abs: 5.2 10*3/uL (ref 1.7–7.7)
Neutrophils Relative %: 64 %
Platelets: 214 10*3/uL (ref 150–400)
RBC: 3.69 MIL/uL — ABNORMAL LOW (ref 3.87–5.11)
RDW: 12.9 % (ref 11.5–15.5)
WBC: 8.2 10*3/uL (ref 4.0–10.5)
nRBC: 0 % (ref 0.0–0.2)

## 2021-07-10 LAB — TSH: TSH: 2.003 u[IU]/mL (ref 0.308–3.960)

## 2021-07-10 MED ORDER — GABAPENTIN 100 MG PO CAPS
200.0000 mg | ORAL_CAPSULE | Freq: Two times a day (BID) | ORAL | 2 refills | Status: DC
Start: 2021-07-10 — End: 2021-10-30

## 2021-07-10 MED ORDER — SODIUM CHLORIDE 0.9% FLUSH
10.0000 mL | INTRAVENOUS | Status: DC | PRN
Start: 2021-07-10 — End: 2021-07-10
  Administered 2021-07-10: 10 mL

## 2021-07-10 MED ORDER — SODIUM CHLORIDE 0.9 % IV SOLN
1500.0000 mg | Freq: Once | INTRAVENOUS | Status: AC
  Administered 2021-07-10: 1500 mg via INTRAVENOUS
  Filled 2021-07-10: qty 30

## 2021-07-10 MED ORDER — SODIUM CHLORIDE 0.9% FLUSH
10.0000 mL | INTRAVENOUS | Status: DC | PRN
  Administered 2021-07-10: 10 mL

## 2021-07-10 MED ORDER — HEPARIN SOD (PORK) LOCK FLUSH 100 UNIT/ML IV SOLN
500.0000 [IU] | Freq: Once | INTRAVENOUS | Status: AC | PRN
  Administered 2021-07-10: 500 [IU]

## 2021-07-10 MED ORDER — SODIUM CHLORIDE 0.9 % IV SOLN: Freq: Once | INTRAVENOUS | Status: AC

## 2021-07-10 NOTE — Patient Instructions (Signed)
[facility]  Discharge Instructions: Thank you for choosing [facility] to provide your oncology and hematology care.   If you have a lab appointment with the [facility], please go directly to the [facility] and check in at the registration area.   Wear comfortable clothing and clothing appropriate for easy access to any Portacath or PICC line.   We strive to give you quality time with your provider. You may need to reschedule your appointment if you arrive late (15 or more minutes).  Arriving late affects you and other patients whose appointments are after yours.  Also, if you miss three or more appointments without notifying the office, you may be dismissed from the clinic at the provider's discretion.      For prescription refill requests, have your pharmacy contact our office and allow 72 hours for refills to be completed.    Today you received the following chemotherapy and/or immunotherapy agent: Durvalumab (Imfinzi)   To help prevent nausea and vomiting after your treatment, we encourage you to take your nausea medication as directed.  BELOW ARE SYMPTOMS THAT SHOULD BE REPORTED IMMEDIATELY: *FEVER GREATER THAN 100.4 F (38 C) OR HIGHER *CHILLS OR SWEATING *NAUSEA AND VOMITING THAT IS NOT CONTROLLED WITH YOUR NAUSEA MEDICATION *UNUSUAL SHORTNESS OF BREATH *UNUSUAL BRUISING OR BLEEDING *URINARY PROBLEMS (pain or burning when urinating, or frequent urination) *BOWEL PROBLEMS (unusual diarrhea, constipation, pain near the anus) TENDERNESS IN MOUTH AND THROAT WITH OR WITHOUT PRESENCE OF ULCERS (sore throat, sores in mouth, or a toothache) UNUSUAL RASH, SWELLING OR PAIN  UNUSUAL VAGINAL DISCHARGE OR ITCHING   Items with * indicate a potential emergency and should be followed up as soon as possible or go to the Emergency Department if any problems should occur.  Please show the CHEMOTHERAPY ALERT CARD or IMMUNOTHERAPY ALERT CARD at  check-in to the Emergency Department and triage nurse.  Should you have questions after your visit or need to cancel or reschedule your appointment, please contact [facility]  Dept: [phone]  and follow the prompts.  Office hours are 8:00 a.m. to 4:30 p.m. Monday - Friday. Please note that voicemails left after 4:00 p.m. may not be returned until the following business day.  We are closed weekends and major holidays. You have access to a nurse at all times for urgent questions. Please call the main number to the clinic Dept: [phone] and follow the prompts.   For any non-urgent questions, you may also contact your provider using MyChart. We now offer e-Visits for anyone 2 and older to request care online for non-urgent symptoms. For details visit [email or web address].   Also download the MyChart app! Go to the app store, search "MyChart", open the app, select [facility], and log in with your MyChart username and password.  Due to Covid, a mask is required upon entering the hospital/clinic. If you do not have a mask, one will be given to you upon arrival. For doctor visits, patients may have 1 support person aged 21 or older with them. For treatment visits, patients cannot have anyone with them due to current Covid guidelines and our immunocompromised population.

## 2021-08-07 ENCOUNTER — Inpatient Hospital Stay (HOSPITAL_BASED_OUTPATIENT_CLINIC_OR_DEPARTMENT_OTHER): Payer: Federal, State, Local not specified - PPO | Admitting: Internal Medicine

## 2021-08-07 ENCOUNTER — Inpatient Hospital Stay: Payer: Federal, State, Local not specified - PPO

## 2021-08-07 ENCOUNTER — Other Ambulatory Visit: Payer: Self-pay

## 2021-08-07 ENCOUNTER — Encounter: Payer: Self-pay | Admitting: Internal Medicine

## 2021-08-07 ENCOUNTER — Inpatient Hospital Stay (HOSPITAL_BASED_OUTPATIENT_CLINIC_OR_DEPARTMENT_OTHER): Payer: Federal, State, Local not specified - PPO

## 2021-08-07 VITALS — BP 149/61 | HR 75 | Temp 97.9°F | Resp 19 | Ht 62.0 in | Wt 139.6 lb

## 2021-08-07 DIAGNOSIS — Z23 Encounter for immunization: Secondary | ICD-10-CM | POA: Diagnosis not present

## 2021-08-07 DIAGNOSIS — C3490 Malignant neoplasm of unspecified part of unspecified bronchus or lung: Secondary | ICD-10-CM

## 2021-08-07 DIAGNOSIS — C3411 Malignant neoplasm of upper lobe, right bronchus or lung: Secondary | ICD-10-CM | POA: Diagnosis not present

## 2021-08-07 DIAGNOSIS — Z95828 Presence of other vascular implants and grafts: Secondary | ICD-10-CM

## 2021-08-07 DIAGNOSIS — R937 Abnormal findings on diagnostic imaging of other parts of musculoskeletal system: Secondary | ICD-10-CM

## 2021-08-07 DIAGNOSIS — Z1211 Encounter for screening for malignant neoplasm of colon: Secondary | ICD-10-CM

## 2021-08-07 LAB — CBC WITH DIFFERENTIAL/PLATELET
Abs Immature Granulocytes: 0.02 10*3/uL (ref 0.00–0.07)
Basophils Absolute: 0 10*3/uL (ref 0.0–0.1)
Basophils Relative: 1 %
Eosinophils Absolute: 0.3 10*3/uL (ref 0.0–0.5)
Eosinophils Relative: 3 %
HCT: 35.4 % — ABNORMAL LOW (ref 36.0–46.0)
Hemoglobin: 12.1 g/dL (ref 12.0–15.0)
Immature Granulocytes: 0 %
Lymphocytes Relative: 16 %
Lymphs Abs: 1.4 10*3/uL (ref 0.7–4.0)
MCH: 31.8 pg (ref 26.0–34.0)
MCHC: 34.2 g/dL (ref 30.0–36.0)
MCV: 93.2 fL (ref 80.0–100.0)
Monocytes Absolute: 0.7 10*3/uL (ref 0.1–1.0)
Monocytes Relative: 9 %
Neutro Abs: 6 10*3/uL (ref 1.7–7.7)
Neutrophils Relative %: 71 %
Platelets: 240 10*3/uL (ref 150–400)
RBC: 3.8 MIL/uL — ABNORMAL LOW (ref 3.87–5.11)
RDW: 13 % (ref 11.5–15.5)
WBC: 8.4 10*3/uL (ref 4.0–10.5)
nRBC: 0 % (ref 0.0–0.2)

## 2021-08-07 LAB — COMPREHENSIVE METABOLIC PANEL
ALT: 16 U/L (ref 0–44)
AST: 25 U/L (ref 15–41)
Albumin: 3.5 g/dL (ref 3.5–5.0)
Alkaline Phosphatase: 82 U/L (ref 38–126)
Anion gap: 6 (ref 5–15)
BUN: 18 mg/dL (ref 8–23)
CO2: 26 mmol/L (ref 22–32)
Calcium: 8.9 mg/dL (ref 8.9–10.3)
Chloride: 106 mmol/L (ref 98–111)
Creatinine, Ser: 0.73 mg/dL (ref 0.44–1.00)
GFR, Estimated: >60 mL/min (ref >60)
Glucose, Bld: 131 mg/dL — ABNORMAL HIGH (ref 70–99)
Potassium: 4 mmol/L (ref 3.5–5.1)
Sodium: 138 mmol/L (ref 135–145)
Total Bilirubin: 0.5 mg/dL (ref 0.3–1.2)
Total Protein: 7 g/dL (ref 6.5–8.1)

## 2021-08-07 LAB — TSH: TSH: 1.849 u[IU]/mL (ref 0.308–3.960)

## 2021-08-07 MED ORDER — SODIUM CHLORIDE 0.9% FLUSH
10.0000 mL | INTRAVENOUS | Status: DC | PRN
  Administered 2021-08-07: 10 mL

## 2021-08-07 MED ORDER — HEPARIN SOD (PORK) LOCK FLUSH 100 UNIT/ML IV SOLN
500.0000 [IU] | Freq: Once | INTRAVENOUS | Status: AC | PRN
  Administered 2021-08-07: 500 [IU]

## 2021-08-07 MED ORDER — SODIUM CHLORIDE 0.9 % IV SOLN
1500.0000 mg | Freq: Once | INTRAVENOUS | Status: AC
  Administered 2021-08-07: 1500 mg via INTRAVENOUS
  Filled 2021-08-07: qty 30

## 2021-08-07 MED ORDER — SODIUM CHLORIDE 0.9 % IV SOLN: Freq: Once | INTRAVENOUS | Status: AC

## 2021-08-07 NOTE — Patient Instructions (Signed)
Steps to Quit Smoking Smoking tobacco is the leading cause of preventable death. It can affect almost every organ in the body. Smoking puts you and people around you at risk for many serious, long-lasting (chronic) diseases. Quitting smoking can be hard, but it is one of the best things that you can do for your health. It is never too late to quit. How do I get ready to quit? When you decide to quit smoking, make a plan to help you succeed. Before you quit: Pick a date to quit. Set a date within the next 2 weeks to give you time to prepare. Write down the reasons why you are quitting. Keep this list in places where you will see it often. Tell your family, friends, and co-workers that you are quitting. Their support is important. Talk with your doctor about the choices that may help you quit. Find out if your health insurance will pay for these treatments. Know the people, places, things, and activities that make you want to smoke (triggers). Avoid them. What first steps can I take to quit smoking? Throw away all cigarettes at home, at work, and in your car. Throw away the things that you use when you smoke, such as ashtrays and lighters. Clean your car. Make sure to empty the ashtray. Clean your home, including curtains and carpets. What can I do to help me quit smoking? Talk with your doctor about taking medicines and seeing a counselor at the same time. You are more likely to succeed when you do both. If you are pregnant or breastfeeding, talk with your doctor about counseling or other ways to quit smoking. Do not take medicine to help you quit smoking unless your doctor tells you to do so. To quit smoking: Quit right away Quit smoking totally, instead of slowly cutting back on how much you smoke over a period of time. Go to counseling. You are more likely to quit if you go to counseling sessions regularly. Take medicine You may take medicines to help you quit. Some medicines need a  prescription, and some you can buy over-the-counter. Some medicines may contain a drug called nicotine to replace the nicotine in cigarettes. Medicines may: Help you to stop having the desire to smoke (cravings). Help to stop the problems that come when you stop smoking (withdrawal symptoms). Your doctor may ask you to use: Nicotine patches, gum, or lozenges. Nicotine inhalers or sprays. Non-nicotine medicine that is taken by mouth. Find resources Find resources and other ways to help you quit smoking and remain smoke-free after you quit. These resources are most helpful when you use them often. They include: Online chats with a counselor. Phone quitlines. Printed self-help materials. Support groups or group counseling. Text messaging programs. Mobile phone apps. Use apps on your mobile phone or tablet that can help you stick to your quit plan. There are many free apps for mobile phones and tablets as well as websites. Examples include Quit Guide from the CDC and smokefree.gov  What things can I do to make it easier to quit?  Talk to your family and friends. Ask them to support and encourage you. Call a phone quitline (1-800-QUIT-NOW), reach out to support groups, or work with a counselor. Ask people who smoke to not smoke around you. Avoid places that make you want to smoke, such as: Bars. Parties. Smoke-break areas at work. Spend time with people who do not smoke. Lower the stress in your life. Stress can make you want to   smoke. Try these things to help your stress: Getting regular exercise. Doing deep-breathing exercises. Doing yoga. Meditating. Doing a body scan. To do this, close your eyes, focus on one area of your body at a time from head to toe. Notice which parts of your body are tense. Try to relax the muscles in those areas. How will I feel when I quit smoking? Day 1 to 3 weeks Within the first 24 hours, you may start to have some problems that come from quitting tobacco.  These problems are very bad 2-3 days after you quit, but they do not often last for more than 2-3 weeks. You may get these symptoms: Mood swings. Feeling restless, nervous, angry, or annoyed. Trouble concentrating. Dizziness. Strong desire for high-sugar foods and nicotine. Weight gain. Trouble pooping (constipation). Feeling like you may vomit (nausea). Coughing or a sore throat. Changes in how the medicines that you take for other issues work in your body. Depression. Trouble sleeping (insomnia). Week 3 and afterward After the first 2-3 weeks of quitting, you may start to notice more positive results, such as: Better sense of smell and taste. Less coughing and sore throat. Slower heart rate. Lower blood pressure. Clearer skin. Better breathing. Fewer sick days. Quitting smoking can be hard. Do not give up if you fail the first time. Some people need to try a few times before they succeed. Do your best to stick to your quit plan, and talk with your doctor if you have any questions or concerns. Summary Smoking tobacco is the leading cause of preventable death. Quitting smoking can be hard, but it is one of the best things that you can do for your health. When you decide to quit smoking, make a plan to help you succeed. Quit smoking right away, not slowly over a period of time. When you start quitting, seek help from your doctor, family, or friends. This information is not intended to replace advice given to you by your health care provider. Make sure you discuss any questions you have with your health care provider. Document Revised: 06/19/2019 Document Reviewed: 12/13/2018 Elsevier Patient Education  2022 Elsevier Inc.  

## 2021-08-07 NOTE — Patient Instructions (Signed)
[facility]   Discharge Instructions: Thank you for choosing [facility] to provide your oncology and hematology care.   If you have a lab appointment with the Cancer Center, please go directly to the Cancer Center and check in at the registration area.   Wear comfortable clothing and clothing appropriate for easy access to any Portacath or PICC line.   We strive to give you quality time with your provider. You may need to reschedule your appointment if you arrive late (15 or more minutes).  Arriving late affects you and other patients whose appointments are after yours.  Also, if you miss three or more appointments without notifying the office, you may be dismissed from the clinic at the provider's discretion.      For prescription refill requests, have your pharmacy contact our office and allow 72 hours for refills to be completed.    Today you received the following chemotherapy and/or immunotherapy agents: durvalumab.      To help prevent nausea and vomiting after your treatment, we encourage you to take your nausea medication as directed.  BELOW ARE SYMPTOMS THAT SHOULD BE REPORTED IMMEDIATELY: *FEVER GREATER THAN 100.4 F (38 C) OR HIGHER *CHILLS OR SWEATING *NAUSEA AND VOMITING THAT IS NOT CONTROLLED WITH YOUR NAUSEA MEDICATION *UNUSUAL SHORTNESS OF BREATH *UNUSUAL BRUISING OR BLEEDING *URINARY PROBLEMS (pain or burning when urinating, or frequent urination) *BOWEL PROBLEMS (unusual diarrhea, constipation, pain near the anus) TENDERNESS IN MOUTH AND THROAT WITH OR WITHOUT PRESENCE OF ULCERS (sore throat, sores in mouth, or a toothache) UNUSUAL RASH, SWELLING OR PAIN  UNUSUAL VAGINAL DISCHARGE OR ITCHING   Items with * indicate a potential emergency and should be followed up as soon as possible or go to the Emergency Department if any problems should occur.  Please show the CHEMOTHERAPY ALERT CARD or IMMUNOTHERAPY ALERT CARD at check-in  to the Emergency Department and triage nurse.  Should you have questions after your visit or need to cancel or reschedule your appointment, please contact [facility]  Dept: [phone]  and follow the prompts.  Office hours are 8:00 a.m. to 4:30 p.m. Monday - Friday. Please note that voicemails left after 4:00 p.m. may not be returned until the following business day.  We are closed weekends and major holidays. You have access to a nurse at all times for urgent questions. Please call the main number to the clinic Dept: [phone] and follow the prompts.   For any non-urgent questions, you may also contact your provider using MyChart. We now offer e-Visits for anyone 18 and older to request care online for non-urgent symptoms. For details visit [email or web address].   Also download the MyChart app! Go to the app store, search "MyChart", open the app, select [facility], and log in with your MyChart username and password.  Due to Covid, a mask is required upon entering the hospital/clinic. If you do not have a mask, one will be given to you upon arrival. For doctor visits, patients may have 1 support person aged 18 or older with them. For treatment visits, patients cannot have anyone with them due to current Covid guidelines and our immunocompromised population.   

## 2021-08-07 NOTE — Progress Notes (Signed)
   Covid-19 Vaccination Clinic  Name:  Brittany Marks    MRN: [id number] DOB: 06/23/1941  08/07/2021  Brittany Marks was observed post Covid-19 immunization for 15 minutes without incident. She was provided with Vaccine Information Sheet and instruction to access the V-Safe system.   Brittany Marks was instructed to call 911 with any severe reactions post vaccine: Difficulty breathing  Swelling of face and throat  A fast heartbeat  A bad rash all over body  Dizziness and weakness

## 2021-08-07 NOTE — Progress Notes (Signed)
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   PRIOR THERAPY: None   CURRENT THERAPY: Palliative systemic chemotherapy with carboplatin for AUC of 5 on day 1, etoposide 100 mg/M2 on days 1, 2 and 3 as well as Imfinzi 1500 mg every 3 weeks with the chemotherapy and Neulasta support. First dose on 03/16/2019. Status post 32  cycles.   Starting from cycle #5 the patient will be treated with maintenance treatment with Imfinzi 1500 mg IV every 4 weeks.  INTERVAL HISTORY: Brittany Marks 80 y.o. female returns to the clinic today for follow-up visit.  The patient is feeling fine today with no concerning complaints.  She has no current chest pain, shortness of breath, cough or hemoptysis.  She has no nausea, vomiting, diarrhea or constipation.  She continues to complain of arthralgia and peripheral neuropathy.  She is currently on gabapentin 100 mg p.o. twice daily.  She denied having any headache or visual changes.  She is here today for evaluation before starting cycle #33 of her treatment.   MEDICAL HISTORY: Past Medical History:  Diagnosis Date   Cataract    Bilateral   Constipation    pain medication   Left rotator cuff tear    Right rotator cuff tear    SCL CA dx'd 11/2018    ALLERGIES:  has No Known Allergies.  MEDICATIONS:  Current Outpatient Medications  Medication Sig Dispense Refill   Ascorbic Acid (VITAMIN C PO) Take by mouth daily.      b complex vitamins tablet Take 1 tablet by mouth daily.     COD LIVER OIL PO Take 1 tablet by mouth daily.      cyclobenzaprine (FLEXERIL) 10 MG tablet Take 1  tablet (10 mg total) by mouth 3 (three) times daily as needed for muscle spasms. 30 tablet 1   gabapentin (NEURONTIN) 100 MG capsule Take 2 capsules (200 mg total) by mouth 2 (two) times daily. 90 capsule 2   HYDROcodone-acetaminophen (NORCO) 5-325 MG tablet Take 1 tablet by mouth every 6 (six) hours as needed for moderate pain. 20 tablet 0   hydrocortisone 2.5 % cream Apply topically as needed. (Patient taking differently: Apply 1 application topically daily as needed (Treatment).) 3.5 g 0   ibuprofen (ADVIL) 200 MG tablet Take 600 mg by mouth daily.     lidocaine-prilocaine (EMLA) cream Apply 1 application topically every 30 (thirty) days.      Multiple Vitamin (MULTIVITAMIN) tablet Take 1 tablet by mouth daily.     ondansetron (ZOFRAN) 4 MG tablet Take 1 tablet (4 mg total) by mouth every 8 (eight) hours as needed for nausea or vomiting. 10 tablet 0   oxymetazoline (AFRIN) 0.05 % nasal spray Place 2 sprays into the nose 2 (two) times daily as needed (Bloody nose).      prochlorperazine (COMPAZINE) 10 MG tablet Take 1 tablet (10 mg total) by mouth every 6 (six) hours as needed for nausea or vomiting. 30 tablet 0   silver sulfADIAZINE (SILVADENE) 1 % cream Apply 1 application topically 2 (two) times daily. 50 g 1   sodium chloride (OCEAN) 0.65 %  nasal spray Place 1 spray into the nose at bedtime.     VITAMIN E PO Take 1 tablet by mouth daily.      No current facility-administered medications for this visit.   Facility-Administered Medications Ordered in Other Visits  Medication Dose Route Frequency Provider Last Rate Last Admin   heparin lock flush 100 unit/mL  500 Units Intracatheter Once PRN Magrinat, Virgie Dad, MD       sodium chloride flush (NS) 0.9 % injection 10 mL  10 mL Intracatheter PRN Magrinat, Virgie Dad, MD        SURGICAL HISTORY:  Past Surgical History:  Procedure Laterality Date   CATARACT EXTRACTION W/ INTRAOCULAR LENS  IMPLANT, BILATERAL  2012   DECOMPRESSIVE LUMBAR  LAMINECTOMY LEVEL 2 N/A 07/29/2013   Procedure: LUMBAR LAMINECTOMY, DECOMPRESSION LUMBAR THREE TO FOUR, FOUR TO FIVE microdiscectomy l3,4 right;  Surgeon: Tobi Bastos, MD;  Location: [facility];  Service: Orthopedics;  Laterality: N/A;   I & D SUPERIOR RIGHT SHOULDER AND CLOSURE WOUND  01-10-2011   S/P ROTATOR CUFF REPAIR   IR IMAGING GUIDED PORT INSERTION  04/09/2019   LUMBAR LAMINECTOMY  1970'S   LUMBAR LAMINECTOMY/DECOMPRESSION MICRODISCECTOMY N/A 06/27/2016   Procedure: L1 - L2 DISCECTOMY;  Surgeon: Melina Schools, MD;  Location: [facility];  Service: Orthopedics;  Laterality: N/A;   LUMBAR SPINE SURGERY  1983   REVERSE SHOULDER ARTHROPLASTY Left 03/10/2020   Procedure: REVERSE SHOULDER ARTHROPLASTY;  Surgeon: Justice Britain, MD;  Location: [facility];  Service: Orthopedics;  Laterality: Left;  111min   RIGHT SHOULDER ARTHROSCOPY/ OPEN DISTAL CLAVICLE RESECTION/ SAD/ OPEN ROTATOR CUFF REPAIR  11-28-2010   SHOULDER FUSION SURGERY  03/11/2020   Shoulder Surgery , hardware placed   SHOULDER OPEN ROTATOR CUFF REPAIR  12/19/2011   Procedure: ROTATOR CUFF REPAIR SHOULDER OPEN;  Surgeon: Magnus Sinning, MD;  Location: [facility];  Service: Orthopedics;  Laterality: Right;  RIGHT RECURRENT OPEN REPAIR OF THE ROTATOR CUFF WITH TISSUE MEND GRAFTANTERIOR CHROMIOECTOMY   VAGINAL HYSTERECTOMY  1979    REVIEW OF SYSTEMS:  A comprehensive review of systems was negative except for: Constitutional: positive for fatigue Musculoskeletal: positive for arthralgias Neurological: positive for paresthesia   PHYSICAL EXAMINATION: General appearance: alert, cooperative, fatigued, and no distress Head: Normocephalic, without obvious abnormality, atraumatic Neck: no adenopathy, no JVD, supple, symmetrical, trachea midline, and thyroid not enlarged, symmetric, no tenderness/mass/nodules Lymph nodes: Cervical, supraclavicular, and axillary nodes normal. Resp: clear to auscultation bilaterally Back: symmetric,  no curvature. ROM normal. No CVA tenderness. Cardio: regular rate and rhythm, S1, S2 normal, no murmur, click, rub or gallop GI: soft, non-tender; bowel sounds normal; no masses,  no organomegaly Extremities: extremities normal, atraumatic, no cyanosis or edema   ECOG PERFORMANCE STATUS: 1 - Symptomatic but completely ambulatory  Blood pressure (!) 149/61, pulse 75, temperature 97.9 F (36.6 C), temperature source Tympanic, resp. rate 19, height 5\' 2"  (1.575 m), weight 139 lb 9.6 oz (63.3 kg), SpO2 96 %.  LABORATORY DATA: Lab Results  Component Value Date   WBC 8.4 08/07/2021   HGB 12.1 08/07/2021   HCT 35.4 (L) 08/07/2021   MCV 93.2 08/07/2021   PLT 240 08/07/2021      Chemistry      Component Value Date/Time   NA 138 07/10/2021 1128   K 3.9 07/10/2021 1128   CL 105 07/10/2021 1128   CO2 23 07/10/2021 1128   BUN 24 (H) 07/10/2021 1128   CREATININE 0.82 07/10/2021 1128  CREATININE 0.76 12/21/2019 1100      Component Value Date/Time   CALCIUM 8.9 07/10/2021 1128   ALKPHOS 80 07/10/2021 1128   AST 23 07/10/2021 1128   AST 22 12/21/2019 1100   ALT 15 07/10/2021 1128   ALT 13 12/21/2019 1100   BILITOT 0.3 07/10/2021 1128   BILITOT 0.3 12/21/2019 1100       RADIOGRAPHIC STUDIES: No results found.   ASSESSMENT AND PLAN: This is a very pleasant 80 years old white female with extensive stage small cell lung cancer and she is currently undergoing treatment with carboplatin, etoposide and Imfinzi status post 5 cycles.  Starting from cycle #6 the patient is on maintenance treatment with single agent Imfinzi every 4 weeks status post 32 cycles of total treatment. The patient continues to tolerate this treatment well with no concerning adverse effects. I recommended for her to proceed with cycle #33 today as planned. I will see her back for follow-up visit in 4 weeks for evaluation with repeat CT scan of the chest, abdomen and pelvis for restaging of her disease. For the  peripheral neuropathy, will increase her dose of gabapentin to 200 mg p.o. twice daily. For the hypertension she was advised to take her blood pressure medications and monitor it closely at home. The patient was advised to call immediately if she has any other concerning symptoms in the interval. The patient voices understanding of current disease status and treatment options and is in agreement with the current care plan.  All questions were answered. The patient knows to call the clinic with any problems, questions or concerns. We can certainly see the patient much sooner if necessary.  Disclaimer: This note was dictated with voice recognition software. Similar sounding words can inadvertently be transcribed and may not be corrected upon review.

## 2021-08-09 ENCOUNTER — Telehealth: Payer: Self-pay | Admitting: Internal Medicine

## 2021-08-09 NOTE — Telephone Encounter (Signed)
Left message with follow-up appointment per 10/31 los.

## 2021-08-28 ENCOUNTER — Other Ambulatory Visit: Payer: Self-pay

## 2021-08-28 ENCOUNTER — Encounter (HOSPITAL_COMMUNITY): Payer: Self-pay

## 2021-08-28 ENCOUNTER — Ambulatory Visit (HOSPITAL_COMMUNITY)
Admission: RE | Admit: 2021-08-28 | Discharge: 2021-08-28 | Disposition: A | Discharge status: Home / Self Care | Payer: Federal, State, Local not specified - PPO | Source: Ambulatory Visit | Attending: Internal Medicine | Admitting: Internal Medicine

## 2021-08-28 DIAGNOSIS — C3490 Malignant neoplasm of unspecified part of unspecified bronchus or lung: Secondary | ICD-10-CM | POA: Insufficient documentation

## 2021-08-28 MED ORDER — HEPARIN SOD (PORK) LOCK FLUSH 100 UNIT/ML IV SOLN: 500.0000 [IU] | Freq: Once | INTRAVENOUS | Status: AC

## 2021-08-28 MED ORDER — SODIUM CHLORIDE (PF) 0.9 % IJ SOLN
INTRAMUSCULAR | Status: AC
  Filled 2021-08-28: qty 50

## 2021-08-28 MED ORDER — IOHEXOL 350 MG/ML SOLN
75.0000 mL | Freq: Once | INTRAVENOUS | Status: AC | PRN
  Administered 2021-08-28: 80 mL via INTRAVENOUS

## 2021-08-28 MED ORDER — HEPARIN SOD (PORK) LOCK FLUSH 100 UNIT/ML IV SOLN
INTRAVENOUS | Status: AC
  Administered 2021-08-28: 500 [IU] via INTRAVENOUS
  Filled 2021-08-28: qty 5

## 2021-09-04 ENCOUNTER — Inpatient Hospital Stay: Payer: Federal, State, Local not specified - PPO | Attending: Oncology | Admitting: Internal Medicine

## 2021-09-04 ENCOUNTER — Inpatient Hospital Stay: Payer: Federal, State, Local not specified - PPO

## 2021-09-04 ENCOUNTER — Other Ambulatory Visit: Payer: Self-pay

## 2021-09-04 VITALS — BP 155/60 | HR 73 | Temp 97.2°F | Resp 16 | Ht 62.0 in | Wt 140.8 lb

## 2021-09-04 DIAGNOSIS — I700 Atherosclerosis of aorta: Secondary | ICD-10-CM | POA: Diagnosis not present

## 2021-09-04 DIAGNOSIS — Z23 Encounter for immunization: Secondary | ICD-10-CM | POA: Insufficient documentation

## 2021-09-04 DIAGNOSIS — Z79899 Other long term (current) drug therapy: Secondary | ICD-10-CM | POA: Diagnosis not present

## 2021-09-04 DIAGNOSIS — Z95828 Presence of other vascular implants and grafts: Secondary | ICD-10-CM

## 2021-09-04 DIAGNOSIS — N1330 Unspecified hydronephrosis: Secondary | ICD-10-CM | POA: Insufficient documentation

## 2021-09-04 DIAGNOSIS — C3411 Malignant neoplasm of upper lobe, right bronchus or lung: Secondary | ICD-10-CM

## 2021-09-04 DIAGNOSIS — G629 Polyneuropathy, unspecified: Secondary | ICD-10-CM | POA: Insufficient documentation

## 2021-09-04 DIAGNOSIS — C7951 Secondary malignant neoplasm of bone: Secondary | ICD-10-CM | POA: Insufficient documentation

## 2021-09-04 DIAGNOSIS — K5900 Constipation, unspecified: Secondary | ICD-10-CM | POA: Insufficient documentation

## 2021-09-04 DIAGNOSIS — I10 Essential (primary) hypertension: Secondary | ICD-10-CM | POA: Insufficient documentation

## 2021-09-04 DIAGNOSIS — Z5112 Encounter for antineoplastic immunotherapy: Secondary | ICD-10-CM | POA: Diagnosis not present

## 2021-09-04 DIAGNOSIS — I2510 Atherosclerotic heart disease of native coronary artery without angina pectoris: Secondary | ICD-10-CM | POA: Insufficient documentation

## 2021-09-04 DIAGNOSIS — R937 Abnormal findings on diagnostic imaging of other parts of musculoskeletal system: Secondary | ICD-10-CM

## 2021-09-04 DIAGNOSIS — Z1211 Encounter for screening for malignant neoplasm of colon: Secondary | ICD-10-CM

## 2021-09-04 LAB — CBC WITH DIFFERENTIAL/PLATELET
Abs Immature Granulocytes: 0.02 10*3/uL (ref 0.00–0.07)
Basophils Absolute: 0 10*3/uL (ref 0.0–0.1)
Basophils Relative: 1 %
Eosinophils Absolute: 0.3 10*3/uL (ref 0.0–0.5)
Eosinophils Relative: 3 %
HCT: 33.8 % — ABNORMAL LOW (ref 36.0–46.0)
Hemoglobin: 11.6 g/dL — ABNORMAL LOW (ref 12.0–15.0)
Immature Granulocytes: 0 %
Lymphocytes Relative: 21 %
Lymphs Abs: 1.8 10*3/uL (ref 0.7–4.0)
MCH: 32.1 pg (ref 26.0–34.0)
MCHC: 34.3 g/dL (ref 30.0–36.0)
MCV: 93.6 fL (ref 80.0–100.0)
Monocytes Absolute: 0.7 10*3/uL (ref 0.1–1.0)
Monocytes Relative: 9 %
Neutro Abs: 5.6 10*3/uL (ref 1.7–7.7)
Neutrophils Relative %: 66 %
Platelets: 225 10*3/uL (ref 150–400)
RBC: 3.61 MIL/uL — ABNORMAL LOW (ref 3.87–5.11)
RDW: 13.1 % (ref 11.5–15.5)
WBC: 8.4 10*3/uL (ref 4.0–10.5)
nRBC: 0 % (ref 0.0–0.2)

## 2021-09-04 LAB — COMPREHENSIVE METABOLIC PANEL
ALT: 16 U/L (ref 0–44)
AST: 25 U/L (ref 15–41)
Albumin: 3.6 g/dL (ref 3.5–5.0)
Alkaline Phosphatase: 81 U/L (ref 38–126)
Anion gap: 9 (ref 5–15)
BUN: 18 mg/dL (ref 8–23)
CO2: 22 mmol/L (ref 22–32)
Calcium: 8.7 mg/dL — ABNORMAL LOW (ref 8.9–10.3)
Chloride: 106 mmol/L (ref 98–111)
Creatinine, Ser: 0.76 mg/dL (ref 0.44–1.00)
GFR, Estimated: >60 mL/min (ref >60)
Glucose, Bld: 97 mg/dL (ref 70–99)
Potassium: 4 mmol/L (ref 3.5–5.1)
Sodium: 137 mmol/L (ref 135–145)
Total Bilirubin: 0.4 mg/dL (ref 0.3–1.2)
Total Protein: 7.2 g/dL (ref 6.5–8.1)

## 2021-09-04 LAB — TSH: TSH: 3.322 u[IU]/mL (ref 0.308–3.960)

## 2021-09-04 MED ORDER — SODIUM CHLORIDE 0.9% FLUSH
10.0000 mL | INTRAVENOUS | Status: DC | PRN
  Administered 2021-09-04: 14:00:00 10 mL

## 2021-09-04 MED ORDER — SODIUM CHLORIDE 0.9% FLUSH
10.0000 mL | INTRAVENOUS | Status: DC | PRN
  Administered 2021-09-04: 11:00:00 10 mL

## 2021-09-04 MED ORDER — HEPARIN SOD (PORK) LOCK FLUSH 100 UNIT/ML IV SOLN
500.0000 [IU] | Freq: Once | INTRAVENOUS | Status: AC | PRN
  Administered 2021-09-04: 14:00:00 500 [IU]

## 2021-09-04 MED ORDER — INFLUENZA VAC A&B SA ADJ QUAD 0.5 ML IM PRSY
0.5000 mL | PREFILLED_SYRINGE | Freq: Once | INTRAMUSCULAR | Status: AC
  Administered 2021-09-04: 12:00:00 0.5 mL via INTRAMUSCULAR
  Filled 2021-09-04: qty 0.5

## 2021-09-04 MED ORDER — SODIUM CHLORIDE 0.9 % IV SOLN
1500.0000 mg | Freq: Once | INTRAVENOUS | Status: AC
  Administered 2021-09-04: 13:00:00 1500 mg via INTRAVENOUS
  Filled 2021-09-04: qty 30

## 2021-09-04 MED ORDER — SODIUM CHLORIDE 0.9 % IV SOLN: Freq: Once | INTRAVENOUS | Status: AC

## 2021-09-04 NOTE — Patient Instructions (Signed)
[facility]   Discharge Instructions: Thank you for choosing [facility] to provide your oncology and hematology care.   If you have a lab appointment with the Cancer Center, please go directly to the Cancer Center and check in at the registration area.   Wear comfortable clothing and clothing appropriate for easy access to any Portacath or PICC line.   We strive to give you quality time with your provider. You may need to reschedule your appointment if you arrive late (15 or more minutes).  Arriving late affects you and other patients whose appointments are after yours.  Also, if you miss three or more appointments without notifying the office, you may be dismissed from the clinic at the provider's discretion.      For prescription refill requests, have your pharmacy contact our office and allow 72 hours for refills to be completed.    Today you received the following chemotherapy and/or immunotherapy agents: durvalumab.      To help prevent nausea and vomiting after your treatment, we encourage you to take your nausea medication as directed.  BELOW ARE SYMPTOMS THAT SHOULD BE REPORTED IMMEDIATELY: *FEVER GREATER THAN 100.4 F (38 C) OR HIGHER *CHILLS OR SWEATING *NAUSEA AND VOMITING THAT IS NOT CONTROLLED WITH YOUR NAUSEA MEDICATION *UNUSUAL SHORTNESS OF BREATH *UNUSUAL BRUISING OR BLEEDING *URINARY PROBLEMS (pain or burning when urinating, or frequent urination) *BOWEL PROBLEMS (unusual diarrhea, constipation, pain near the anus) TENDERNESS IN MOUTH AND THROAT WITH OR WITHOUT PRESENCE OF ULCERS (sore throat, sores in mouth, or a toothache) UNUSUAL RASH, SWELLING OR PAIN  UNUSUAL VAGINAL DISCHARGE OR ITCHING   Items with * indicate a potential emergency and should be followed up as soon as possible or go to the Emergency Department if any problems should occur.  Please show the CHEMOTHERAPY ALERT CARD or IMMUNOTHERAPY ALERT CARD at check-in  to the Emergency Department and triage nurse.  Should you have questions after your visit or need to cancel or reschedule your appointment, please contact [facility]  Dept: [phone]  and follow the prompts.  Office hours are 8:00 a.m. to 4:30 p.m. Monday - Friday. Please note that voicemails left after 4:00 p.m. may not be returned until the following business day.  We are closed weekends and major holidays. You have access to a nurse at all times for urgent questions. Please call the main number to the clinic Dept: [phone] and follow the prompts.   For any non-urgent questions, you may also contact your provider using MyChart. We now offer e-Visits for anyone 18 and older to request care online for non-urgent symptoms. For details visit [email or web address].   Also download the MyChart app! Go to the app store, search "MyChart", open the app, select [facility], and log in with your MyChart username and password.  Due to Covid, a mask is required upon entering the hospital/clinic. If you do not have a mask, one will be given to you upon arrival. For doctor visits, patients may have 1 support person aged 18 or older with them. For treatment visits, patients cannot have anyone with them due to current Covid guidelines and our immunocompromised population.   

## 2021-09-04 NOTE — Progress Notes (Signed)
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   PRIOR THERAPY: None   CURRENT THERAPY: Palliative systemic chemotherapy with carboplatin for AUC of 5 on day 1, etoposide 100 mg/M2 on days 1, 2 and 3 as well as Imfinzi 1500 mg every 3 weeks with the chemotherapy and Neulasta support. First dose on 03/16/2019. Status post 33  cycles.   Starting from cycle #5 the patient will be treated with maintenance treatment with Imfinzi 1500 mg IV every 4 weeks.  INTERVAL HISTORY: Brittany Marks 80 y.o. female returns to the clinic today for follow-up visit.  The patient is feeling fine today with no concerning complaints except for the baseline fatigue.  She also had a lot of diarrhea after the CT scan of the abdomen pelvis with contrast.  She denied having any current chest pain, shortness of breath, cough or hemoptysis.  She denied having any fever or chills.  She has no nausea, vomiting, diarrhea or constipation.  She has no current headache or visual changes.  She had repeat CT scan of the chest, abdomen pelvis performed recently and she is here for evaluation and discussion of her scan results.    MEDICAL HISTORY: Past Medical History:  Diagnosis Date   Cataract    Bilateral   Constipation    pain medication   Left rotator cuff tear    Right rotator cuff tear    SCL CA dx'd 11/2018    ALLERGIES:  has No Known Allergies.  MEDICATIONS:  Current Outpatient Medications  Medication Sig Dispense Refill   Ascorbic Acid (VITAMIN C PO) Take by mouth daily.      b complex vitamins tablet Take 1 tablet by mouth daily.      COD LIVER OIL PO Take 1 tablet by mouth daily.      cyclobenzaprine (FLEXERIL) 10 MG tablet Take 1 tablet (10 mg total) by mouth 3 (three) times daily as needed for muscle spasms. 30 tablet 1   gabapentin (NEURONTIN) 100 MG capsule Take 2 capsules (200 mg total) by mouth 2 (two) times daily. 90 capsule 2   HYDROcodone-acetaminophen (NORCO) 5-325 MG tablet Take 1 tablet by mouth every 6 (six) hours as needed for moderate pain. 20 tablet 0   hydrocortisone 2.5 % cream Apply topically as needed. (Patient taking differently: Apply 1 application topically daily as needed (Treatment).) 3.5 g 0   ibuprofen (ADVIL) 200 MG tablet Take 600 mg by mouth daily.     lidocaine-prilocaine (EMLA) cream Apply 1 application topically every 30 (thirty) days.      Multiple Vitamin (MULTIVITAMIN) tablet Take 1 tablet by mouth daily.     ondansetron (ZOFRAN) 4 MG tablet Take 1 tablet (4 mg total) by mouth every 8 (eight) hours as needed for nausea or vomiting. 10 tablet 0   oxymetazoline (AFRIN) 0.05 % nasal spray Place 2 sprays into the nose 2 (two) times daily as needed (Bloody nose).      prochlorperazine (COMPAZINE) 10 MG tablet Take 1 tablet (10 mg total) by mouth every 6 (six) hours as needed for nausea or vomiting. 30 tablet 0   silver  sulfADIAZINE (SILVADENE) 1 % cream Apply 1 application topically 2 (two) times daily. 50 g 1   sodium chloride (OCEAN) 0.65 % nasal spray Place 1 spray into the nose at bedtime.     VITAMIN E PO Take 1 tablet by mouth daily.      No current facility-administered medications for this visit.   Facility-Administered Medications Ordered in Other Visits  Medication Dose Route Frequency Provider Last Rate Last Admin   heparin lock flush 100 unit/mL  500 Units Intracatheter Once PRN Magrinat, Virgie Dad, MD       sodium chloride flush (NS) 0.9 % injection 10 mL  10 mL Intracatheter PRN Magrinat, Virgie Dad, MD       sodium chloride flush (NS) 0.9 % injection 10 mL  10 mL Intracatheter  PRN Magrinat, Virgie Dad, MD   10 mL at 09/04/21 1127    SURGICAL HISTORY:  Past Surgical History:  Procedure Laterality Date   CATARACT EXTRACTION W/ INTRAOCULAR LENS  IMPLANT, BILATERAL  2012   DECOMPRESSIVE LUMBAR LAMINECTOMY LEVEL 2 N/A 07/29/2013   Procedure: LUMBAR LAMINECTOMY, DECOMPRESSION LUMBAR THREE TO FOUR, FOUR TO FIVE microdiscectomy l3,4 right;  Surgeon: Tobi Bastos, MD;  Location: [facility];  Service: Orthopedics;  Laterality: N/A;   I & D SUPERIOR RIGHT SHOULDER AND CLOSURE WOUND  01-10-2011   S/P ROTATOR CUFF REPAIR   IR IMAGING GUIDED PORT INSERTION  04/09/2019   LUMBAR LAMINECTOMY  1970'S   LUMBAR LAMINECTOMY/DECOMPRESSION MICRODISCECTOMY N/A 06/27/2016   Procedure: L1 - L2 DISCECTOMY;  Surgeon: Melina Schools, MD;  Location: [facility];  Service: Orthopedics;  Laterality: N/A;   LUMBAR SPINE SURGERY  1983   REVERSE SHOULDER ARTHROPLASTY Left 03/10/2020   Procedure: REVERSE SHOULDER ARTHROPLASTY;  Surgeon: Justice Britain, MD;  Location: [facility];  Service: Orthopedics;  Laterality: Left;  171min   RIGHT SHOULDER ARTHROSCOPY/ OPEN DISTAL CLAVICLE RESECTION/ SAD/ OPEN ROTATOR CUFF REPAIR  11-28-2010   SHOULDER FUSION SURGERY  03/11/2020   Shoulder Surgery , hardware placed   SHOULDER OPEN ROTATOR CUFF REPAIR  12/19/2011   Procedure: ROTATOR CUFF REPAIR SHOULDER OPEN;  Surgeon: Magnus Sinning, MD;  Location: [facility];  Service: Orthopedics;  Laterality: Right;  RIGHT RECURRENT OPEN REPAIR OF THE ROTATOR CUFF WITH TISSUE MEND GRAFTANTERIOR CHROMIOECTOMY   VAGINAL HYSTERECTOMY  1979    REVIEW OF SYSTEMS:  Constitutional: positive for fatigue Eyes: negative Ears, nose, mouth, throat, and face: negative Respiratory: negative Cardiovascular: negative Gastrointestinal: positive for diarrhea Genitourinary:negative Integument/breast: negative Hematologic/lymphatic: negative Musculoskeletal:negative Neurological: negative Behavioral/Psych: negative Endocrine:  negative Allergic/Immunologic: negative   PHYSICAL EXAMINATION: General appearance: alert, cooperative, fatigued, and no distress Head: Normocephalic, without obvious abnormality, atraumatic Neck: no adenopathy, no JVD, supple, symmetrical, trachea midline, and thyroid not enlarged, symmetric, no tenderness/mass/nodules Lymph nodes: Cervical, supraclavicular, and axillary nodes normal. Resp: clear to auscultation bilaterally Back: symmetric, no curvature. ROM normal. No CVA tenderness. Cardio: regular rate and rhythm, S1, S2 normal, no murmur, click, rub or gallop GI: soft, non-tender; bowel sounds normal; no masses,  no organomegaly Extremities: extremities normal, atraumatic, no cyanosis or edema Neurologic: Alert and oriented X 3, normal strength and tone. Normal symmetric reflexes. Normal coordination and gait   ECOG PERFORMANCE STATUS: 1 - Symptomatic but completely ambulatory  Blood pressure (!) 155/60, pulse 73, temperature (!) 97.2 F (36.2 C), temperature source Axillary, resp. rate 16, height 5\' 2"  (1.575 m), weight 140 lb 12.8 oz (63.9 kg).  LABORATORY DATA: Lab Results  Component Value Date  WBC 8.4 08/07/2021   HGB 12.1 08/07/2021   HCT 35.4 (L) 08/07/2021   MCV 93.2 08/07/2021   PLT 240 08/07/2021      Chemistry      Component Value Date/Time   NA 138 08/07/2021 1335   K 4.0 08/07/2021 1335   CL 106 08/07/2021 1335   CO2 26 08/07/2021 1335   BUN 18 08/07/2021 1335   CREATININE 0.73 08/07/2021 1335   CREATININE 0.76 12/21/2019 1100      Component Value Date/Time   CALCIUM 8.9 08/07/2021 1335   ALKPHOS 82 08/07/2021 1335   AST 25 08/07/2021 1335   AST 22 12/21/2019 1100   ALT 16 08/07/2021 1335   ALT 13 12/21/2019 1100   BILITOT 0.5 08/07/2021 1335   BILITOT 0.3 12/21/2019 1100       RADIOGRAPHIC STUDIES: CT Chest W Contrast  Result Date: 08/29/2021 CLINICAL DATA:  Metastatic lung cancer restaging, status post chemotherapy and radiation, ongoing  immunotherapy EXAM: CT CHEST, ABDOMEN, AND PELVIS WITH CONTRAST TECHNIQUE: Multidetector CT imaging of the chest, abdomen and pelvis was performed following the standard protocol during bolus administration of intravenous contrast. CONTRAST:  61mL OMNIPAQUE IOHEXOL 350 MG/ML SOLN, additional oral enteric contrast COMPARISON:  05/15/2021 FINDINGS: CT CHEST FINDINGS Cardiovascular: Right chest port catheter. Aortic atherosclerosis. Normal heart size. Three-vessel coronary artery calcifications. No pericardial effusion. Mediastinum/Nodes: Interval enlargement of pretracheal lymph nodes, largest measuring 2.5 x 1.9 cm, previously 1.7 x 1.4 cm (series 2, image 19). Thyroid gland, trachea, and esophagus demonstrate no significant findings. Lungs/Pleura: Unchanged moderate fibrosis in a pattern with apical basal gradient featuring irregular peripheral interstitial opacity, septal thickening, subpleural bronchiolectasis, and honeycombing at the lung bases. Mild underlying paraseptal centrilobular emphysema. Unchanged subpleural consolidation in the anterolateral left upper lobe, measuring 2.6 x 1.4 cm (series 4, image 42). No pleural effusion or pneumothorax. Musculoskeletal: No chest wall mass or suspicious bone lesions identified. CT ABDOMEN PELVIS FINDINGS Hepatobiliary: No solid liver abnormality is seen. No gallstones, gallbladder wall thickening, or biliary dilatation. Pancreas: Unremarkable. No pancreatic ductal dilatation or surrounding inflammatory changes. Spleen: Normal in size without significant abnormality. Adrenals/Urinary Tract: Adrenal glands are unremarkable. Unchanged moderate right hydronephrosis with severely enlarged right renal pelvis (series 2, image 67). The left kidney is normal, without renal calculi, solid lesion, or hydronephrosis. Bladder is unremarkable. Stomach/Bowel: Stomach is within normal limits. Appendix appears normal. No evidence of bowel wall thickening, distention, or inflammatory  changes. Vascular/Lymphatic: Aortic atherosclerosis. No enlarged abdominal or pelvic lymph nodes. Reproductive: Status post hysterectomy. Other: No abdominal wall hernia or abnormality. No abdominopelvic ascites. Musculoskeletal: No acute or significant osseous findings. IMPRESSION: 1. Interval enlargement of pretracheal lymph nodes, consistent with worsened nodal metastatic disease. 2. Unchanged post treatment/post radiation subpleural consolidation in the anterolateral left upper lobe, consistent with treated malignancy. 3. No evidence of metastatic disease in the abdomen or pelvis. 4. Unchanged moderate fibrosis in a pattern with apical basal gradient featuring irregular peripheral interstitial opacity, septal thickening, subpleural bronchiolectasis, and honeycombing at the lung bases. Findings are consistent with UIP per consensus guidelines: Diagnosis of Idiopathic Pulmonary Fibrosis: An Official ATS/ERS/JRS/ALAT Clinical Practice Guideline. [place], Iss 5, [phone], Jun 08 2017. 5. Emphysema. 6. 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 7. Coronary artery disease Aortic Atherosclerosis (ICD10-I70.0) and Emphysema (ICD10-J43.9). Electronically Signed   By: Delanna Ahmadi M.D.   On: 08/29/2021 13:06   CT Abdomen Pelvis  W Contrast  Result Date: 08/29/2021 CLINICAL DATA:  Metastatic lung cancer restaging, status post chemotherapy and radiation, ongoing immunotherapy EXAM: CT CHEST, ABDOMEN, AND PELVIS WITH CONTRAST TECHNIQUE: Multidetector CT imaging of the chest, abdomen and pelvis was performed following the standard protocol during bolus administration of intravenous contrast. CONTRAST:  47mL OMNIPAQUE IOHEXOL 350 MG/ML SOLN, additional oral enteric contrast COMPARISON:  05/15/2021 FINDINGS: CT CHEST FINDINGS Cardiovascular: Right chest port catheter. Aortic atherosclerosis.  Normal heart size. Three-vessel coronary artery calcifications. No pericardial effusion. Mediastinum/Nodes: Interval enlargement of pretracheal lymph nodes, largest measuring 2.5 x 1.9 cm, previously 1.7 x 1.4 cm (series 2, image 19). Thyroid gland, trachea, and esophagus demonstrate no significant findings. Lungs/Pleura: Unchanged moderate fibrosis in a pattern with apical basal gradient featuring irregular peripheral interstitial opacity, septal thickening, subpleural bronchiolectasis, and honeycombing at the lung bases. Mild underlying paraseptal centrilobular emphysema. Unchanged subpleural consolidation in the anterolateral left upper lobe, measuring 2.6 x 1.4 cm (series 4, image 42). No pleural effusion or pneumothorax. Musculoskeletal: No chest wall mass or suspicious bone lesions identified. CT ABDOMEN PELVIS FINDINGS Hepatobiliary: No solid liver abnormality is seen. No gallstones, gallbladder wall thickening, or biliary dilatation. Pancreas: Unremarkable. No pancreatic ductal dilatation or surrounding inflammatory changes. Spleen: Normal in size without significant abnormality. Adrenals/Urinary Tract: Adrenal glands are unremarkable. Unchanged moderate right hydronephrosis with severely enlarged right renal pelvis (series 2, image 67). The left kidney is normal, without renal calculi, solid lesion, or hydronephrosis. Bladder is unremarkable. Stomach/Bowel: Stomach is within normal limits. Appendix appears normal. No evidence of bowel wall thickening, distention, or inflammatory changes. Vascular/Lymphatic: Aortic atherosclerosis. No enlarged abdominal or pelvic lymph nodes. Reproductive: Status post hysterectomy. Other: No abdominal wall hernia or abnormality. No abdominopelvic ascites. Musculoskeletal: No acute or significant osseous findings. IMPRESSION: 1. Interval enlargement of pretracheal lymph nodes, consistent with worsened nodal metastatic disease. 2. Unchanged post treatment/post radiation  subpleural consolidation in the anterolateral left upper lobe, consistent with treated malignancy. 3. No evidence of metastatic disease in the abdomen or pelvis. 4. Unchanged moderate fibrosis in a pattern with apical basal gradient featuring irregular peripheral interstitial opacity, septal thickening, subpleural bronchiolectasis, and honeycombing at the lung bases. Findings are consistent with UIP per consensus guidelines: Diagnosis of Idiopathic Pulmonary Fibrosis: An Official ATS/ERS/JRS/ALAT Clinical Practice Guideline. [place], Iss 5, [phone], Jun 08 2017. 5. Emphysema. 6. 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 7. Coronary artery disease Aortic Atherosclerosis (ICD10-I70.0) and Emphysema (ICD10-J43.9). Electronically Signed   By: Delanna Ahmadi M.D.   On: 08/29/2021 13:06     ASSESSMENT AND PLAN: This is a very pleasant 80 years old white female with extensive stage small cell lung cancer and she is currently undergoing treatment with carboplatin, etoposide and Imfinzi status post 5 cycles.  Starting from cycle #6 the patient is on maintenance treatment with single agent Imfinzi every 4 weeks status post 33 cycles of total treatment. The patient continues to tolerate her treatment fairly well with no concerning adverse effects. She had repeat CT scan of the chest, abdomen pelvis performed recently.  I personally and independently reviewed the scan images and discussed the results with the patient today. The scan report indicate interval increase in size of pretracheal lymph nodes.  I personally compared the current images to the previous one and I did not see a significant change from the previous scan but we will continue to monitor disease mediastinal lymph  nodes closely on the upcoming imaging studies. I recommended for the patient to proceed with cycle #34 today as  planned. I will see her back for follow-up visit in 4 weeks for evaluation before the next cycle of her treatment. The patient was advised to call immediately if she has any concerning symptoms in the interval. For the peripheral neuropathy, will increase her dose of gabapentin to 200 mg p.o. twice daily.  The patient voices understanding of current disease status and treatment options and is in agreement with the current care plan.  All questions were answered. The patient knows to call the clinic with any problems, questions or concerns. We can certainly see the patient much sooner if necessary.  Disclaimer: This note was dictated with voice recognition software. Similar sounding words can inadvertently be transcribed and may not be corrected upon review.

## 2021-09-26 NOTE — Progress Notes (Signed)
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   PRIOR THERAPY: 1) Palliative radiotherapy to the left proximal humerus under the care of Dr. Lisbeth Renshaw. 2) Additional palliative radiotherapy to the proximal humerus under the care of Dr. Lisbeth Renshaw. Last treatment on 04/21/2020  CURRENT THERAPY: Palliative systemic chemotherapy with carboplatin for AUC of 5 on day 1, etoposide 100 mg/M2 on days 1, 2 and 3 as well as Imfinzi 1500 mg every 3 weeks with the chemotherapy and Neulasta support. First dose on 03/16/2019. Status post 34 cycles. Starting from cycle #5 the patient will be treated with maintenance treatment with Imfinzi 1500 mg IV every 4 weeks.  INTERVAL HISTORY: Brittany Marks 80 y.o. female returns to the clinic today for a follow-up visit.  The patient is feeling fairly well today without any concerning complaints except for fatigue and peripheral neuropathy. She is taking 100 mg BID of gabapentin. The peripheral neuropathy is interfering with her ability to hold objects. She did not feel like she had improvement with the 200 mg BID so went back to 100 mg BID. The patient denies any recent fever, chills, or night sweats.  Her weight is stable.  She denies any chest pain or hemoptysis.  She reports her baseline dyspnea on exertion. She reports her mild occasional chronic cough which comes and goes. She denies any chest pain or hemoptysis.  She denies any nausea, vomiting, diarrhea, or constipation. She is still pretty traumatized from having significant diarrhea following drinking the oral contrast from her last scan. She tells me she refuses to drink the oral contrast or having  a scan moving forward. She denies any headache or visual changes. She denies any rashes or skin changes except for dry flaky skin.  She is here today for evaluation and repeat blood work before starting cycle #35.  MEDICAL HISTORY: Past Medical History:  Diagnosis Date   Cataract    Bilateral   Constipation    pain medication   Left rotator cuff tear    Right rotator cuff tear    SCL CA dx'd 11/2018    ALLERGIES:  has No Known Allergies.  MEDICATIONS:  Current Outpatient Medications  Medication Sig Dispense Refill   Ascorbic Acid (VITAMIN C PO) Take by mouth daily.      b complex vitamins tablet Take 1 tablet by mouth daily.     COD LIVER OIL PO Take 1 tablet by mouth daily.      cyclobenzaprine (FLEXERIL) 10 MG tablet Take 1 tablet (10 mg total) by mouth 3 (three) times daily as needed for muscle spasms. 30 tablet 1   gabapentin (NEURONTIN) 100 MG capsule Take 2 capsules (200 mg total) by mouth 2 (two) times daily. 90 capsule 2   HYDROcodone-acetaminophen (NORCO) 5-325 MG tablet Take 1 tablet by mouth every 6 (six) hours as needed for moderate pain. 20 tablet 0   hydrocortisone 2.5 % cream Apply topically as needed. (Patient taking differently: Apply 1 application topically daily as needed (Treatment).) 3.5 g 0   ibuprofen (ADVIL) 200 MG tablet Take 600 mg by mouth daily.     lidocaine-prilocaine (EMLA) cream Apply 1 application topically every 30 (thirty) days.  Multiple Vitamin (MULTIVITAMIN) tablet Take 1 tablet by mouth daily.     ondansetron (ZOFRAN) 4 MG tablet Take 1 tablet (4 mg total) by mouth every 8 (eight) hours as needed for nausea or vomiting. 10 tablet 0   oxymetazoline (AFRIN) 0.05 % nasal spray Place 2 sprays into the nose 2 (two) times daily as needed (Bloody nose).      prochlorperazine (COMPAZINE) 10 MG tablet Take 1 tablet (10 mg total) by mouth every 6 (six) hours as needed for nausea or vomiting. 30 tablet 0   silver sulfADIAZINE (SILVADENE) 1 % cream Apply  1 application topically 2 (two) times daily. 50 g 1   sodium chloride (OCEAN) 0.65 % nasal spray Place 1 spray into the nose at bedtime.     VITAMIN E PO Take 1 tablet by mouth daily.      No current facility-administered medications for this visit.   Facility-Administered Medications Ordered in Other Visits  Medication Dose Route Frequency Provider Last Rate Last Admin   heparin lock flush 100 unit/mL  500 Units Intracatheter Once PRN Magrinat, Virgie Dad, MD       sodium chloride flush (NS) 0.9 % injection 10 mL  10 mL Intracatheter PRN Magrinat, Virgie Dad, MD        SURGICAL HISTORY:  Past Surgical History:  Procedure Laterality Date   CATARACT EXTRACTION W/ INTRAOCULAR LENS  IMPLANT, BILATERAL  2012   DECOMPRESSIVE LUMBAR LAMINECTOMY LEVEL 2 N/A 07/29/2013   Procedure: LUMBAR LAMINECTOMY, DECOMPRESSION LUMBAR THREE TO FOUR, FOUR TO FIVE microdiscectomy l3,4 right;  Surgeon: Tobi Bastos, MD;  Location: [facility];  Service: Orthopedics;  Laterality: N/A;   I & D SUPERIOR RIGHT SHOULDER AND CLOSURE WOUND  01-10-2011   S/P ROTATOR CUFF REPAIR   IR IMAGING GUIDED PORT INSERTION  04/09/2019   LUMBAR LAMINECTOMY  1970'S   LUMBAR LAMINECTOMY/DECOMPRESSION MICRODISCECTOMY N/A 06/27/2016   Procedure: L1 - L2 DISCECTOMY;  Surgeon: Melina Schools, MD;  Location: [facility];  Service: Orthopedics;  Laterality: N/A;   LUMBAR SPINE SURGERY  1983   REVERSE SHOULDER ARTHROPLASTY Left 03/10/2020   Procedure: REVERSE SHOULDER ARTHROPLASTY;  Surgeon: Justice Britain, MD;  Location: [facility];  Service: Orthopedics;  Laterality: Left;  172min   RIGHT SHOULDER ARTHROSCOPY/ OPEN DISTAL CLAVICLE RESECTION/ SAD/ OPEN ROTATOR CUFF REPAIR  11-28-2010   SHOULDER FUSION SURGERY  03/11/2020   Shoulder Surgery , hardware placed   SHOULDER OPEN ROTATOR CUFF REPAIR  12/19/2011   Procedure: ROTATOR CUFF REPAIR SHOULDER OPEN;  Surgeon: Magnus Sinning, MD;  Location: [facility];  Service: Orthopedics;  Laterality:  Right;  RIGHT RECURRENT OPEN REPAIR OF THE ROTATOR CUFF WITH TISSUE MEND GRAFTANTERIOR CHROMIOECTOMY   VAGINAL HYSTERECTOMY  1979    REVIEW OF SYSTEMS:   Constitutional: Positive for fatigue.  Negative for appetite change, chills, fever and unexpected weight change.  HENT: Negative for mouth sores, nosebleeds, sore throat and trouble swallowing.   Eyes: Negative for eye problems and icterus.  Respiratory: Positive for stable dyspnea on exertion and mild intermittent chronic cough.  Negative for hemoptysis and wheezing.   Cardiovascular: Negative for chest pain and leg swelling.  Gastrointestinal: Positive for diarrhea following oral contrast for CT scan (resolved at this time). Negative for abdominal pain, constipation, diarrhea, nausea and vomiting.  Genitourinary: Negative for bladder incontinence, difficulty urinating, dysuria, frequency and hematuria.   Musculoskeletal: Negative for back pain, gait problem, neck pain and neck stiffness.  Skin: Negative for itching and rash.  Neurological: Positive for peripheral neuropathy. Negative for dizziness, extremity weakness, gait problem, headaches, light-headedness and seizures.  Hematological: Negative for adenopathy. Does not bruise/bleed easily.  Psychiatric/Behavioral: Negative for confusion, depression and sleep disturbance. The patient is not nervous/anxious.     PHYSICAL EXAMINATION:  Blood pressure (!) 163/74, pulse 72, temperature 97.6 F (36.4 C), temperature source Tympanic, resp. rate 18, height 5\' 2"  (1.575 m), weight 141 lb 12.8 oz (64.3 kg), SpO2 97 %.  ECOG PERFORMANCE STATUS: 2-3  Physical Exam  Head: Normocephalic and atraumatic. Mouth/Throat: Oropharynx is clear and moist. No oropharyngeal exudate. Eyes: Conjunctivae are normal. Right eye exhibits no discharge. Left eye exhibits no discharge. No scleral icterus. Neck: Normal range of motion. Neck supple. Cardiovascular: Normal rate, regular rhythm, normal heart sounds  and intact distal pulses.   Pulmonary/Chest: Effort normal and breath sounds normal except crackles noted bilaterally in the lung bases. No respiratory distress. No wheezes. Abdominal: Soft. Bowel sounds are normal. Exhibits no distension and no mass. There is no tenderness.  Musculoskeletal: Normal range of motion. Exhibits no edema.  Lymphadenopathy:    No cervical adenopathy.  Neurological: Alert and oriented to person, place, and time. Exhibits muscle wasting.  The patient was examined in the wheelchair.  Skin: No vesicles. Dry skin. Skin is warm and dry. Not diaphoretic. No pallor.  Psychiatric: Mood, memory and judgment normal. Vitals reviewed.  LABORATORY DATA: Lab Results  Component Value Date   WBC 8.4 10/03/2021   HGB 11.3 (L) 10/03/2021   HCT 33.7 (L) 10/03/2021   MCV 94.7 10/03/2021   PLT 219 10/03/2021      Chemistry      Component Value Date/Time   NA 137 09/04/2021 1127   K 4.0 09/04/2021 1127   CL 106 09/04/2021 1127   CO2 22 09/04/2021 1127   BUN 18 09/04/2021 1127   CREATININE 0.76 09/04/2021 1127   CREATININE 0.76 12/21/2019 1100      Component Value Date/Time   CALCIUM 8.7 (L) 09/04/2021 1127   ALKPHOS 81 09/04/2021 1127   AST 25 09/04/2021 1127   AST 22 12/21/2019 1100   ALT 16 09/04/2021 1127   ALT 13 12/21/2019 1100   BILITOT 0.4 09/04/2021 1127   BILITOT 0.3 12/21/2019 1100       RADIOGRAPHIC STUDIES:  No results found.   ASSESSMENT/PLAN:  This is a very pleasant 80 year old Caucasian female diagnosed with extensive stage (T1b, N3, M1 C) small cell lung cancer.  She presented with right upper lobe pulmonary nodules in addition to bilateral hilar and right mediastinal as well as left supraclavicular and abdominal lymphadenopathy.  She also has metastatic disease to the in the left humerus.  She was diagnosed in May 2020.   She is status post palliative radiotherapy to the left proximal humerus under the care of Dr. Lisbeth Renshaw in 2020.  She is  currently undergoing palliative systemic chemotherapy with carboplatin for AUC of 5 on day 1, etoposide 100 mg/M2 on days 1, 2 and 3 as well as Imfinzi 1500 mg every 3 weeks with the chemotherapy and Neulasta support. Status post 34 cycles. Starting from cycle #5 the patient has been treated with maintenance treatment with Imfinzi 1500 mg IV every 4 weeks.  She had a pathological fracture in the left humerus underwent additional radiation to this area. Last treatment was 04/21/20.   The patient's most recent CT scan reported  increase in size of pretracheal lymph nodes.  Dr. Julien Nordmann personally compared the current images to the  previous one and I did not see a significant change from the previous scan but we will continue to monitor disease mediastinal lymph nodes closely on the upcoming imaging studies.  Reviewed with Dr. Julien Nordmann and we will arrange for restaging CT scan we see her at her next appointment  I did review with the patient that there is an option to drink the water based contrast moving forward for the scan of the abdomen and pelvis.  Encouraged her to talk about this in more detail at her next appointment with Dr. Julien Nordmann in January as he will be arranging for her repeat scan at that time.    Discussed alternative dosing options for gabapentin.  Discussed that she can try taking 100 mg 3 times daily or 200 mg twice daily and then 3 times daily.    Labs were reviewed.  Recommend that she proceed with cycle #35 today scheduled.   We will see her back for follow-up visit in 4 weeks for evaluation before starting cycle #36.  The patient was advised to call immediately if she has any concerning symptoms in the interval. The patient voices understanding of current disease status and treatment options and is in agreement with the current care plan. All questions were answered. The patient knows to call the clinic with any problems, questions or concerns. We can certainly see the patient much  sooner if necessary           No orders of the defined types were placed in this encounter.    The total time spent in the appointment was 20-29 minutes.   Annalynn Centanni L Brielynn Sekula, PA-C 10/03/21

## 2021-10-03 ENCOUNTER — Inpatient Hospital Stay: Payer: Federal, State, Local not specified - PPO | Attending: Oncology

## 2021-10-03 ENCOUNTER — Other Ambulatory Visit: Payer: Self-pay

## 2021-10-03 ENCOUNTER — Inpatient Hospital Stay (HOSPITAL_BASED_OUTPATIENT_CLINIC_OR_DEPARTMENT_OTHER): Payer: Federal, State, Local not specified - PPO | Admitting: Physician Assistant

## 2021-10-03 ENCOUNTER — Inpatient Hospital Stay: Payer: Federal, State, Local not specified - PPO

## 2021-10-03 VITALS — BP 163/74 | HR 72 | Temp 97.6°F | Resp 18 | Ht 62.0 in | Wt 141.8 lb

## 2021-10-03 DIAGNOSIS — Z95828 Presence of other vascular implants and grafts: Secondary | ICD-10-CM

## 2021-10-03 DIAGNOSIS — G629 Polyneuropathy, unspecified: Secondary | ICD-10-CM | POA: Insufficient documentation

## 2021-10-03 DIAGNOSIS — Z5112 Encounter for antineoplastic immunotherapy: Secondary | ICD-10-CM | POA: Insufficient documentation

## 2021-10-03 DIAGNOSIS — Z79899 Other long term (current) drug therapy: Secondary | ICD-10-CM | POA: Insufficient documentation

## 2021-10-03 DIAGNOSIS — C3411 Malignant neoplasm of upper lobe, right bronchus or lung: Secondary | ICD-10-CM

## 2021-10-03 DIAGNOSIS — C7951 Secondary malignant neoplasm of bone: Secondary | ICD-10-CM | POA: Diagnosis not present

## 2021-10-03 DIAGNOSIS — Z1211 Encounter for screening for malignant neoplasm of colon: Secondary | ICD-10-CM

## 2021-10-03 DIAGNOSIS — R937 Abnormal findings on diagnostic imaging of other parts of musculoskeletal system: Secondary | ICD-10-CM

## 2021-10-03 LAB — CBC WITH DIFFERENTIAL/PLATELET
Abs Immature Granulocytes: 0.03 10*3/uL (ref 0.00–0.07)
Basophils Absolute: 0 10*3/uL (ref 0.0–0.1)
Basophils Relative: 1 %
Eosinophils Absolute: 0.3 10*3/uL (ref 0.0–0.5)
Eosinophils Relative: 3 %
HCT: 33.7 % — ABNORMAL LOW (ref 36.0–46.0)
Hemoglobin: 11.3 g/dL — ABNORMAL LOW (ref 12.0–15.0)
Immature Granulocytes: 0 %
Lymphocytes Relative: 20 %
Lymphs Abs: 1.6 10*3/uL (ref 0.7–4.0)
MCH: 31.7 pg (ref 26.0–34.0)
MCHC: 33.5 g/dL (ref 30.0–36.0)
MCV: 94.7 fL (ref 80.0–100.0)
Monocytes Absolute: 0.8 10*3/uL (ref 0.1–1.0)
Monocytes Relative: 9 %
Neutro Abs: 5.6 10*3/uL (ref 1.7–7.7)
Neutrophils Relative %: 67 %
Platelets: 219 10*3/uL (ref 150–400)
RBC: 3.56 MIL/uL — ABNORMAL LOW (ref 3.87–5.11)
RDW: 13.1 % (ref 11.5–15.5)
WBC: 8.4 10*3/uL (ref 4.0–10.5)
nRBC: 0 % (ref 0.0–0.2)

## 2021-10-03 LAB — TSH: TSH: 2.664 u[IU]/mL (ref 0.308–3.960)

## 2021-10-03 LAB — COMPREHENSIVE METABOLIC PANEL
ALT: 15 U/L (ref 0–44)
AST: 24 U/L (ref 15–41)
Albumin: 3.7 g/dL (ref 3.5–5.0)
Alkaline Phosphatase: 69 U/L (ref 38–126)
Anion gap: 6 (ref 5–15)
BUN: 19 mg/dL (ref 8–23)
CO2: 26 mmol/L (ref 22–32)
Calcium: 9.1 mg/dL (ref 8.9–10.3)
Chloride: 104 mmol/L (ref 98–111)
Creatinine, Ser: 0.63 mg/dL (ref 0.44–1.00)
GFR, Estimated: >60 mL/min (ref >60)
Glucose, Bld: 106 mg/dL — ABNORMAL HIGH (ref 70–99)
Potassium: 3.8 mmol/L (ref 3.5–5.1)
Sodium: 136 mmol/L (ref 135–145)
Total Bilirubin: 0.3 mg/dL (ref 0.3–1.2)
Total Protein: 7 g/dL (ref 6.5–8.1)

## 2021-10-03 MED ORDER — SODIUM CHLORIDE 0.9 % IV SOLN
1500.0000 mg | Freq: Once | INTRAVENOUS | Status: AC
  Administered 2021-10-03: 13:00:00 1500 mg via INTRAVENOUS
  Filled 2021-10-03: qty 30

## 2021-10-03 MED ORDER — SODIUM CHLORIDE 0.9 % IV SOLN: Freq: Once | INTRAVENOUS | Status: AC

## 2021-10-03 MED ORDER — SODIUM CHLORIDE 0.9% FLUSH
10.0000 mL | INTRAVENOUS | Status: DC | PRN
  Administered 2021-10-03: 11:00:00 10 mL

## 2021-10-03 NOTE — Patient Instructions (Signed)
[facility]   Discharge Instructions: Thank you for choosing [facility] to provide your oncology and hematology care.   If you have a lab appointment with the Cancer Center, please go directly to the Cancer Center and check in at the registration area.   Wear comfortable clothing and clothing appropriate for easy access to any Portacath or PICC line.   We strive to give you quality time with your provider. You may need to reschedule your appointment if you arrive late (15 or more minutes).  Arriving late affects you and other patients whose appointments are after yours.  Also, if you miss three or more appointments without notifying the office, you may be dismissed from the clinic at the provider's discretion.      For prescription refill requests, have your pharmacy contact our office and allow 72 hours for refills to be completed.    Today you received the following chemotherapy and/or immunotherapy agents: durvalumab.      To help prevent nausea and vomiting after your treatment, we encourage you to take your nausea medication as directed.  BELOW ARE SYMPTOMS THAT SHOULD BE REPORTED IMMEDIATELY: *FEVER GREATER THAN 100.4 F (38 C) OR HIGHER *CHILLS OR SWEATING *NAUSEA AND VOMITING THAT IS NOT CONTROLLED WITH YOUR NAUSEA MEDICATION *UNUSUAL SHORTNESS OF BREATH *UNUSUAL BRUISING OR BLEEDING *URINARY PROBLEMS (pain or burning when urinating, or frequent urination) *BOWEL PROBLEMS (unusual diarrhea, constipation, pain near the anus) TENDERNESS IN MOUTH AND THROAT WITH OR WITHOUT PRESENCE OF ULCERS (sore throat, sores in mouth, or a toothache) UNUSUAL RASH, SWELLING OR PAIN  UNUSUAL VAGINAL DISCHARGE OR ITCHING   Items with * indicate a potential emergency and should be followed up as soon as possible or go to the Emergency Department if any problems should occur.  Please show the CHEMOTHERAPY ALERT CARD or IMMUNOTHERAPY ALERT CARD at check-in  to the Emergency Department and triage nurse.  Should you have questions after your visit or need to cancel or reschedule your appointment, please contact [facility]  Dept: [phone]  and follow the prompts.  Office hours are 8:00 a.m. to 4:30 p.m. Monday - Friday. Please note that voicemails left after 4:00 p.m. may not be returned until the following business day.  We are closed weekends and major holidays. You have access to a nurse at all times for urgent questions. Please call the main number to the clinic Dept: [phone] and follow the prompts.   For any non-urgent questions, you may also contact your provider using MyChart. We now offer e-Visits for anyone 18 and older to request care online for non-urgent symptoms. For details visit [email or web address].   Also download the MyChart app! Go to the app store, search "MyChart", open the app, select , and log in with your MyChart username and password.  Due to Covid, a mask is required upon entering the hospital/clinic. If you do not have a mask, one will be given to you upon arrival. For doctor visits, patients may have 1 support person aged 18 or older with them. For treatment visits, patients cannot have anyone with them due to current Covid guidelines and our immunocompromised population.   

## 2021-10-30 ENCOUNTER — Ambulatory Visit: Payer: Federal, State, Local not specified - PPO | Admitting: Internal Medicine

## 2021-10-30 ENCOUNTER — Inpatient Hospital Stay: Payer: Federal, State, Local not specified - PPO

## 2021-10-30 ENCOUNTER — Inpatient Hospital Stay (HOSPITAL_BASED_OUTPATIENT_CLINIC_OR_DEPARTMENT_OTHER): Payer: Federal, State, Local not specified - PPO | Admitting: Internal Medicine

## 2021-10-30 ENCOUNTER — Other Ambulatory Visit: Payer: Self-pay

## 2021-10-30 ENCOUNTER — Encounter: Payer: Self-pay | Admitting: Internal Medicine

## 2021-10-30 ENCOUNTER — Encounter: Payer: Self-pay | Admitting: Oncology

## 2021-10-30 ENCOUNTER — Other Ambulatory Visit: Payer: Self-pay | Admitting: Medical Oncology

## 2021-10-30 ENCOUNTER — Inpatient Hospital Stay: Payer: Federal, State, Local not specified - PPO | Attending: Oncology

## 2021-10-30 ENCOUNTER — Other Ambulatory Visit: Payer: Federal, State, Local not specified - PPO

## 2021-10-30 ENCOUNTER — Ambulatory Visit: Payer: Federal, State, Local not specified - PPO

## 2021-10-30 VITALS — BP 164/72 | HR 83 | Temp 97.8°F | Resp 18 | Ht 62.0 in | Wt 139.2 lb

## 2021-10-30 DIAGNOSIS — C3411 Malignant neoplasm of upper lobe, right bronchus or lung: Secondary | ICD-10-CM | POA: Insufficient documentation

## 2021-10-30 DIAGNOSIS — Z79899 Other long term (current) drug therapy: Secondary | ICD-10-CM | POA: Insufficient documentation

## 2021-10-30 DIAGNOSIS — C7951 Secondary malignant neoplasm of bone: Secondary | ICD-10-CM | POA: Diagnosis not present

## 2021-10-30 DIAGNOSIS — G629 Polyneuropathy, unspecified: Secondary | ICD-10-CM | POA: Diagnosis not present

## 2021-10-30 DIAGNOSIS — Z5112 Encounter for antineoplastic immunotherapy: Secondary | ICD-10-CM

## 2021-10-30 DIAGNOSIS — G6289 Other specified polyneuropathies: Secondary | ICD-10-CM

## 2021-10-30 DIAGNOSIS — Z1211 Encounter for screening for malignant neoplasm of colon: Secondary | ICD-10-CM

## 2021-10-30 DIAGNOSIS — R937 Abnormal findings on diagnostic imaging of other parts of musculoskeletal system: Secondary | ICD-10-CM

## 2021-10-30 LAB — COMPREHENSIVE METABOLIC PANEL
ALT: 13 U/L (ref 0–44)
AST: 20 U/L (ref 15–41)
Albumin: 3.8 g/dL (ref 3.5–5.0)
Alkaline Phosphatase: 75 U/L (ref 38–126)
Anion gap: 6 (ref 5–15)
BUN: 15 mg/dL (ref 8–23)
CO2: 26 mmol/L (ref 22–32)
Calcium: 9 mg/dL (ref 8.9–10.3)
Chloride: 103 mmol/L (ref 98–111)
Creatinine, Ser: 0.65 mg/dL (ref 0.44–1.00)
GFR, Estimated: >60 mL/min (ref >60)
Glucose, Bld: 123 mg/dL — ABNORMAL HIGH (ref 70–99)
Potassium: 3.9 mmol/L (ref 3.5–5.1)
Sodium: 135 mmol/L (ref 135–145)
Total Bilirubin: 0.5 mg/dL (ref 0.3–1.2)
Total Protein: 7.1 g/dL (ref 6.5–8.1)

## 2021-10-30 LAB — CBC WITH DIFFERENTIAL/PLATELET
Abs Immature Granulocytes: 0.01 10*3/uL (ref 0.00–0.07)
Basophils Absolute: 0 10*3/uL (ref 0.0–0.1)
Basophils Relative: 0 %
Eosinophils Absolute: 0.2 10*3/uL (ref 0.0–0.5)
Eosinophils Relative: 3 %
HCT: 33.4 % — ABNORMAL LOW (ref 36.0–46.0)
Hemoglobin: 11.7 g/dL — ABNORMAL LOW (ref 12.0–15.0)
Immature Granulocytes: 0 %
Lymphocytes Relative: 17 %
Lymphs Abs: 1.4 10*3/uL (ref 0.7–4.0)
MCH: 32.3 pg (ref 26.0–34.0)
MCHC: 35 g/dL (ref 30.0–36.0)
MCV: 92.3 fL (ref 80.0–100.0)
Monocytes Absolute: 0.7 10*3/uL (ref 0.1–1.0)
Monocytes Relative: 9 %
Neutro Abs: 5.8 10*3/uL (ref 1.7–7.7)
Neutrophils Relative %: 71 %
Platelets: 226 10*3/uL (ref 150–400)
RBC: 3.62 MIL/uL — ABNORMAL LOW (ref 3.87–5.11)
RDW: 13.1 % (ref 11.5–15.5)
WBC: 8.2 10*3/uL (ref 4.0–10.5)
nRBC: 0 % (ref 0.0–0.2)

## 2021-10-30 LAB — TSH: TSH: 1.71 u[IU]/mL (ref 0.308–3.960)

## 2021-10-30 MED ORDER — GABAPENTIN 100 MG PO CAPS: 200.0000 mg | ORAL_CAPSULE | Freq: Two times a day (BID) | ORAL | 2 refills | Status: DC

## 2021-10-30 MED ORDER — HEPARIN SOD (PORK) LOCK FLUSH 100 UNIT/ML IV SOLN
500.0000 [IU] | Freq: Once | INTRAVENOUS | Status: AC | PRN
  Administered 2021-10-30: 500 [IU]

## 2021-10-30 MED ORDER — SODIUM CHLORIDE 0.9% FLUSH
10.0000 mL | INTRAVENOUS | Status: DC | PRN
  Administered 2021-10-30: 10 mL

## 2021-10-30 MED ORDER — SODIUM CHLORIDE 0.9 % IV SOLN: Freq: Once | INTRAVENOUS | Status: AC

## 2021-10-30 MED ORDER — SODIUM CHLORIDE 0.9 % IV SOLN
1500.0000 mg | Freq: Once | INTRAVENOUS | Status: AC
  Administered 2021-10-30: 1500 mg via INTRAVENOUS
  Filled 2021-10-30: qty 30

## 2021-10-30 NOTE — Progress Notes (Signed)
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   PRIOR THERAPY: None   CURRENT THERAPY: Palliative systemic chemotherapy with carboplatin for AUC of 5 on day 1, etoposide 100 mg/M2 on days 1, 2 and 3 as well as Imfinzi 1500 mg every 3 weeks with the chemotherapy and Neulasta support. First dose on 03/16/2019. Status post 35  cycles.   Starting from cycle #5 the patient will be treated with maintenance treatment with Imfinzi 1500 mg IV every 4 weeks.  INTERVAL HISTORY: Brittany Marks 81 y.o. female returns to the clinic today for follow-up visit.  The patient is feeling fine today with no concerning complaints.  She denied having any chest pain but has shortness of breath with exertion with no cough or hemoptysis.  She has no nausea, vomiting, diarrhea or constipation.  She has no headache or visual changes.  She has no recent weight loss or night sweats.  The patient continues to tolerate her treatment with Imfinzi fairly well.  She is here today for evaluation before starting cycle #36.  MEDICAL HISTORY: Past Medical History:  Diagnosis Date   Cataract    Bilateral   Constipation    pain medication   Left rotator cuff tear    Right rotator cuff tear    SCL CA dx'd 11/2018    ALLERGIES:  has No Known Allergies.  MEDICATIONS:  Current Outpatient Medications  Medication Sig Dispense Refill   Ascorbic Acid (VITAMIN C PO) Take by mouth daily.      b complex vitamins tablet Take 1 tablet by mouth daily.     COD LIVER OIL PO Take 1 tablet by mouth daily.      cyclobenzaprine (FLEXERIL) 10 MG tablet Take 1  tablet (10 mg total) by mouth 3 (three) times daily as needed for muscle spasms. 30 tablet 1   gabapentin (NEURONTIN) 100 MG capsule Take 2 capsules (200 mg total) by mouth 2 (two) times daily. 90 capsule 2   HYDROcodone-acetaminophen (NORCO) 5-325 MG tablet Take 1 tablet by mouth every 6 (six) hours as needed for moderate pain. 20 tablet 0   hydrocortisone 2.5 % cream Apply topically as needed. (Patient taking differently: Apply 1 application topically daily as needed (Treatment).) 3.5 g 0   ibuprofen (ADVIL) 200 MG tablet Take 600 mg by mouth daily.     lidocaine-prilocaine (EMLA) cream Apply 1 application topically every 30 (thirty) days.      Multiple Vitamin (MULTIVITAMIN) tablet Take 1 tablet by mouth daily.     ondansetron (ZOFRAN) 4 MG tablet Take 1 tablet (4 mg total) by mouth every 8 (eight) hours as needed for nausea or vomiting. 10 tablet 0   oxymetazoline (AFRIN) 0.05 % nasal spray Place 2 sprays into the nose 2 (two) times daily as needed (Bloody nose).      prochlorperazine (COMPAZINE) 10 MG tablet Take 1 tablet (10 mg total) by mouth every 6 (six) hours as needed for nausea or vomiting. 30 tablet 0   silver sulfADIAZINE (SILVADENE) 1 % cream Apply 1 application topically 2 (two) times daily. 50 g 1   sodium chloride (OCEAN)  0.65 % nasal spray Place 1 spray into the nose at bedtime.     VITAMIN E PO Take 1 tablet by mouth daily.      No current facility-administered medications for this visit.   Facility-Administered Medications Ordered in Other Visits  Medication Dose Route Frequency Provider Last Rate Last Admin   heparin lock flush 100 unit/mL  500 Units Intracatheter Once PRN Magrinat, Virgie Dad, MD       sodium chloride flush (NS) 0.9 % injection 10 mL  10 mL Intracatheter PRN Magrinat, Virgie Dad, MD        SURGICAL HISTORY:  Past Surgical History:  Procedure Laterality Date   CATARACT EXTRACTION W/ INTRAOCULAR LENS  IMPLANT, BILATERAL  2012   DECOMPRESSIVE LUMBAR  LAMINECTOMY LEVEL 2 N/A 07/29/2013   Procedure: LUMBAR LAMINECTOMY, DECOMPRESSION LUMBAR THREE TO FOUR, FOUR TO FIVE microdiscectomy l3,4 right;  Surgeon: Tobi Bastos, MD;  Location: [facility];  Service: Orthopedics;  Laterality: N/A;   I & D SUPERIOR RIGHT SHOULDER AND CLOSURE WOUND  01-10-2011   S/P ROTATOR CUFF REPAIR   IR IMAGING GUIDED PORT INSERTION  04/09/2019   LUMBAR LAMINECTOMY  1970'S   LUMBAR LAMINECTOMY/DECOMPRESSION MICRODISCECTOMY N/A 06/27/2016   Procedure: L1 - L2 DISCECTOMY;  Surgeon: Melina Schools, MD;  Location: [facility];  Service: Orthopedics;  Laterality: N/A;   LUMBAR SPINE SURGERY  1983   REVERSE SHOULDER ARTHROPLASTY Left 03/10/2020   Procedure: REVERSE SHOULDER ARTHROPLASTY;  Surgeon: Justice Britain, MD;  Location: [facility];  Service: Orthopedics;  Laterality: Left;  165min   RIGHT SHOULDER ARTHROSCOPY/ OPEN DISTAL CLAVICLE RESECTION/ SAD/ OPEN ROTATOR CUFF REPAIR  11-28-2010   SHOULDER FUSION SURGERY  03/11/2020   Shoulder Surgery , hardware placed   SHOULDER OPEN ROTATOR CUFF REPAIR  12/19/2011   Procedure: ROTATOR CUFF REPAIR SHOULDER OPEN;  Surgeon: Magnus Sinning, MD;  Location: [facility];  Service: Orthopedics;  Laterality: Right;  RIGHT RECURRENT OPEN REPAIR OF THE ROTATOR CUFF WITH TISSUE MEND GRAFTANTERIOR CHROMIOECTOMY   VAGINAL HYSTERECTOMY  1979    REVIEW OF SYSTEMS:  A comprehensive review of systems was negative except for: Constitutional: positive for fatigue   PHYSICAL EXAMINATION: General appearance: alert, cooperative, fatigued, and no distress Head: Normocephalic, without obvious abnormality, atraumatic Neck: no adenopathy, no JVD, supple, symmetrical, trachea midline, and thyroid not enlarged, symmetric, no tenderness/mass/nodules Lymph nodes: Cervical, supraclavicular, and axillary nodes normal. Resp: clear to auscultation bilaterally Back: symmetric, no curvature. ROM normal. No CVA tenderness. Cardio: regular rate and rhythm, S1,  S2 normal, no murmur, click, rub or gallop GI: soft, non-tender; bowel sounds normal; no masses,  no organomegaly Extremities: extremities normal, atraumatic, no cyanosis or edema   ECOG PERFORMANCE STATUS: 1 - Symptomatic but completely ambulatory  Blood pressure (!) 164/72, pulse 83, temperature 97.8 F (36.6 C), temperature source Tympanic, resp. rate 18, height 5\' 2"  (1.575 m), weight 139 lb 3.2 oz (63.1 kg), SpO2 96 %.  LABORATORY DATA: Lab Results  Component Value Date   WBC 8.4 10/03/2021   HGB 11.3 (L) 10/03/2021   HCT 33.7 (L) 10/03/2021   MCV 94.7 10/03/2021   PLT 219 10/03/2021      Chemistry      Component Value Date/Time   NA 136 10/03/2021 1120   K 3.8 10/03/2021 1120   CL 104 10/03/2021 1120   CO2 26 10/03/2021 1120   BUN 19 10/03/2021 1120   CREATININE 0.63 10/03/2021 1120   CREATININE 0.76 12/21/2019 1100  Component Value Date/Time   CALCIUM 9.1 10/03/2021 1120   ALKPHOS 69 10/03/2021 1120   AST 24 10/03/2021 1120   AST 22 12/21/2019 1100   ALT 15 10/03/2021 1120   ALT 13 12/21/2019 1100   BILITOT 0.3 10/03/2021 1120   BILITOT 0.3 12/21/2019 1100       RADIOGRAPHIC STUDIES: No results found.   ASSESSMENT AND PLAN: This is a very pleasant 81 years old white female with extensive stage small cell lung cancer and she is currently undergoing treatment with carboplatin, etoposide and Imfinzi status post 5 cycles.  Starting from cycle #6 the patient is on maintenance treatment with single agent Imfinzi every 4 weeks status post 35 cycles of total treatment. The patient continues to tolerate her treatment with Imfinzi fairly well with no concerning adverse effects. I recommended for her to proceed with cycle #36 today as planned. I will see her back for follow-up visit in 4 weeks for evaluation before starting cycle #37.  She will have repeat imaging studies after the next cycle of her treatment. For the peripheral neuropathy, she will continue her  current treatment with gabapentin to 200 mg p.o. twice daily. The patient was advised to call immediately if she has any other concerning symptoms in the interval. The patient voices understanding of current disease status and treatment options and is in agreement with the current care plan.  All questions were answered. The patient knows to call the clinic with any problems, questions or concerns. We can certainly see the patient much sooner if necessary.  Disclaimer: This note was dictated with voice recognition software. Similar sounding words can inadvertently be transcribed and may not be corrected upon review.

## 2021-10-30 NOTE — Patient Instructions (Signed)
[facility]  Discharge Instructions: Thank you for choosing [facility] to provide your oncology and hematology care.   If you have a lab appointment with the Cancer Center, please go directly to the Cancer Center and check in at the registration area.   Wear comfortable clothing and clothing appropriate for easy access to any Portacath or PICC line.   We strive to give you quality time with your provider. You may need to reschedule your appointment if you arrive late (15 or more minutes).  Arriving late affects you and other patients whose appointments are after yours.  Also, if you miss three or more appointments without notifying the office, you may be dismissed from the clinic at the provider's discretion.      For prescription refill requests, have your pharmacy contact our office and allow 72 hours for refills to be completed.    Today you received the following chemotherapy and/or immunotherapy agents Imfinzi      To help prevent nausea and vomiting after your treatment, we encourage you to take your nausea medication as directed.  BELOW ARE SYMPTOMS THAT SHOULD BE REPORTED IMMEDIATELY: *FEVER GREATER THAN 100.4 F (38 C) OR HIGHER *CHILLS OR SWEATING *NAUSEA AND VOMITING THAT IS NOT CONTROLLED WITH YOUR NAUSEA MEDICATION *UNUSUAL SHORTNESS OF BREATH *UNUSUAL BRUISING OR BLEEDING *URINARY PROBLEMS (pain or burning when urinating, or frequent urination) *BOWEL PROBLEMS (unusual diarrhea, constipation, pain near the anus) TENDERNESS IN MOUTH AND THROAT WITH OR WITHOUT PRESENCE OF ULCERS (sore throat, sores in mouth, or a toothache) UNUSUAL RASH, SWELLING OR PAIN  UNUSUAL VAGINAL DISCHARGE OR ITCHING   Items with * indicate a potential emergency and should be followed up as soon as possible or go to the Emergency Department if any problems should occur.  Please show the CHEMOTHERAPY ALERT CARD or IMMUNOTHERAPY ALERT CARD at check-in to the  Emergency Department and triage nurse.  Should you have questions after your visit or need to cancel or reschedule your appointment, please contact [facility]  Dept: [phone]  and follow the prompts.  Office hours are 8:00 a.m. to 4:30 p.m. Monday - Friday. Please note that voicemails left after 4:00 p.m. may not be returned until the following business day.  We are closed weekends and major holidays. You have access to a nurse at all times for urgent questions. Please call the main number to the clinic Dept: [phone] and follow the prompts.   For any non-urgent questions, you may also contact your provider using MyChart. We now offer e-Visits for anyone 18 and older to request care online for non-urgent symptoms. For details visit [email or web address].   Also download the MyChart app! Go to the app store, search "MyChart", open the app, select [facility], and log in with your MyChart username and password.  Due to Covid, a mask is required upon entering the hospital/clinic. If you do not have a mask, one will be given to you upon arrival. For doctor visits, patients may have 1 support person aged 18 or older with them. For treatment visits, patients cannot have anyone with them due to current Covid guidelines and our immunocompromised population.   

## 2021-11-27 ENCOUNTER — Ambulatory Visit: Payer: Federal, State, Local not specified - PPO | Admitting: Physician Assistant

## 2021-11-27 ENCOUNTER — Other Ambulatory Visit: Payer: Federal, State, Local not specified - PPO

## 2021-11-27 ENCOUNTER — Ambulatory Visit: Payer: Federal, State, Local not specified - PPO

## 2021-11-27 NOTE — Progress Notes (Signed)
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   PRIOR THERAPY: 1) Palliative radiotherapy to the left proximal humerus under the care of Dr. Lisbeth Renshaw. 2) Additional palliative radiotherapy to the proximal humerus under the care of Dr. Lisbeth Renshaw. Last treatment on 04/21/2020  CURRENT THERAPY:  Palliative systemic chemotherapy with carboplatin for AUC of 5 on day 1, etoposide 100 mg/M2 on days 1, 2 and 3 as well as Imfinzi 1500 mg every 3 weeks with the chemotherapy and Neulasta support. First dose on 03/16/2019. Status post 36 cycles. Starting from cycle #5 the patient will be treated with maintenance treatment with Imfinzi 1500 mg IV every 4 weeks.  INTERVAL HISTORY: Brittany Marks 81 y.o. female returns to the clinic today for a follow-up visit.  The patient is feeling fairly well today without any concerning complaints. She reports her baseline fatigue. The patient denies any recent fever, chills, or night sweats.  Her weight is stable.  She denies any chest pain or hemoptysis.  She reports her baseline dyspnea on exertion. She reports her mild occasional chronic cough which comes and goes. She denies any chest pain or hemoptysis.  She denies any nausea, vomiting, diarrhea, or constipation. She is still pretty traumatized from having significant diarrhea following drinking the oral contrast from her last scan. Therefore, she refuses to drink oral contrast moving forward, which I discussed with her because I am going to order her next restaging scan today. She denies any headache or visual changes. She denies any rashes or skin changes except for dry flaky skin.  She is here today  for evaluation and repeat blood work before starting cycle #37.    MEDICAL HISTORY: Past Medical History:  Diagnosis Date   Cataract    Bilateral   Constipation    pain medication   Left rotator cuff tear    Right rotator cuff tear    SCL CA dx'd 11/2018    ALLERGIES:  has No Known Allergies.  MEDICATIONS:  Current Outpatient Medications  Medication Sig Dispense Refill   Ascorbic Acid (VITAMIN C PO) Take by mouth daily.      b complex vitamins tablet Take 1 tablet by mouth daily.     COD LIVER OIL PO Take 1 tablet by mouth daily.      cyclobenzaprine (FLEXERIL) 10 MG tablet Take 1 tablet (10 mg total) by mouth 3 (three) times daily as needed for muscle spasms. 30 tablet 1   gabapentin (NEURONTIN) 100 MG capsule Take 2 capsules (200 mg total) by mouth 2 (two) times daily. 90 capsule 2   HYDROcodone-acetaminophen (NORCO) 5-325 MG tablet Take 1 tablet by mouth every 6 (six) hours as needed for moderate pain. 20 tablet 0   hydrocortisone 2.5 % cream Apply topically as needed. (Patient taking differently: Apply 1 application topically daily as needed (Treatment).) 3.5 g 0   ibuprofen (ADVIL) 200 MG tablet Take 600 mg by mouth daily.     lidocaine-prilocaine (EMLA) cream Apply 1 application topically every 30 (thirty) days.      Multiple Vitamin (MULTIVITAMIN) tablet Take 1 tablet by mouth daily.     ondansetron (ZOFRAN) 4 MG tablet Take 1 tablet (4 mg total) by  mouth every 8 (eight) hours as needed for nausea or vomiting. 10 tablet 0   oxymetazoline (AFRIN) 0.05 % nasal spray Place 2 sprays into the nose 2 (two) times daily as needed (Bloody nose).      prochlorperazine (COMPAZINE) 10 MG tablet Take 1 tablet (10 mg total) by mouth every 6 (six) hours as needed for nausea or vomiting. 30 tablet 0   silver sulfADIAZINE (SILVADENE) 1 % cream Apply 1 application topically 2 (two) times daily. 50 g 1   sodium chloride (OCEAN) 0.65 % nasal spray Place 1 spray into the nose at bedtime.      VITAMIN E PO Take 1 tablet by mouth daily.      No current facility-administered medications for this visit.   Facility-Administered Medications Ordered in Other Visits  Medication Dose Route Frequency Provider Last Rate Last Admin   heparin lock flush 100 unit/mL  500 Units Intracatheter Once PRN Magrinat, Virgie Dad, MD       sodium chloride flush (NS) 0.9 % injection 10 mL  10 mL Intracatheter PRN Magrinat, Virgie Dad, MD        SURGICAL HISTORY:  Past Surgical History:  Procedure Laterality Date   CATARACT EXTRACTION W/ INTRAOCULAR LENS  IMPLANT, BILATERAL  2012   DECOMPRESSIVE LUMBAR LAMINECTOMY LEVEL 2 N/A 07/29/2013   Procedure: LUMBAR LAMINECTOMY, DECOMPRESSION LUMBAR THREE TO FOUR, FOUR TO FIVE microdiscectomy l3,4 right;  Surgeon: Tobi Bastos, MD;  Location: [facility];  Service: Orthopedics;  Laterality: N/A;   I & D SUPERIOR RIGHT SHOULDER AND CLOSURE WOUND  01-10-2011   S/P ROTATOR CUFF REPAIR   IR IMAGING GUIDED PORT INSERTION  04/09/2019   LUMBAR LAMINECTOMY  1970'S   LUMBAR LAMINECTOMY/DECOMPRESSION MICRODISCECTOMY N/A 06/27/2016   Procedure: L1 - L2 DISCECTOMY;  Surgeon: Melina Schools, MD;  Location: [facility];  Service: Orthopedics;  Laterality: N/A;   LUMBAR SPINE SURGERY  1983   REVERSE SHOULDER ARTHROPLASTY Left 03/10/2020   Procedure: REVERSE SHOULDER ARTHROPLASTY;  Surgeon: Justice Britain, MD;  Location: [facility];  Service: Orthopedics;  Laterality: Left;  135min   RIGHT SHOULDER ARTHROSCOPY/ OPEN DISTAL CLAVICLE RESECTION/ SAD/ OPEN ROTATOR CUFF REPAIR  11-28-2010   SHOULDER FUSION SURGERY  03/11/2020   Shoulder Surgery , hardware placed   SHOULDER OPEN ROTATOR CUFF REPAIR  12/19/2011   Procedure: ROTATOR CUFF REPAIR SHOULDER OPEN;  Surgeon: Magnus Sinning, MD;  Location: [place];  Service: Orthopedics;  Laterality: Right;  RIGHT RECURRENT OPEN REPAIR OF THE ROTATOR CUFF WITH TISSUE MEND GRAFTANTERIOR CHROMIOECTOMY   VAGINAL HYSTERECTOMY  1979    REVIEW  OF SYSTEMS:   Constitutional: Positive for fatigue.  Negative for appetite change, chills, fever and unexpected weight change.  HENT: Negative for mouth sores, nosebleeds, sore throat and trouble swallowing.   Eyes: Negative for eye problems and icterus.  Respiratory: Positive for stable dyspnea on exertion and mild intermittent chronic cough.  Negative for hemoptysis and wheezing.   Cardiovascular: Negative for chest pain and leg swelling.  Gastrointestinal: Negative for abdominal pain, recent diarrhea, constipation, diarrhea, nausea and vomiting.  Genitourinary: Negative for bladder incontinence, difficulty urinating, dysuria, frequency and hematuria.   Musculoskeletal: Negative for back pain, gait problem, neck pain and neck stiffness.  Skin: Negative for itching and rash.  Neurological: Positive for peripheral neuropathy. Negative for dizziness, extremity weakness, gait problem, headaches, light-headedness and seizures.  Hematological: Negative for adenopathy. Does not bruise/bleed easily.  Psychiatric/Behavioral: Negative for confusion, depression and sleep disturbance. The patient  is not nervous/anxious.      PHYSICAL EXAMINATION:  There were no vitals taken for this visit.  ECOG PERFORMANCE STATUS: 2  Physical Exam  Constitutional: Oriented to person, place, and time and well-developed, well-nourished, and in no distress.  Head: Normocephalic and atraumatic. Mouth/Throat: Oropharynx is clear and moist. No oropharyngeal exudate. Eyes: Conjunctivae are normal. Right eye exhibits no discharge. Left eye exhibits no discharge. No scleral icterus. Neck: Normal range of motion. Neck supple. Cardiovascular: Normal rate, regular rhythm, systolic murmur noted and intact distal pulses.   Pulmonary/Chest: Effort normal and breath sounds normal except crackles noted bilaterally in the lung bases. No respiratory distress. No wheezes. Abdominal: Soft. Bowel sounds are normal. Exhibits no  distension and no mass. There is no tenderness.  Musculoskeletal: Normal range of motion. Exhibits no edema.  Lymphadenopathy:    No cervical adenopathy.  Neurological: Alert and oriented to person, place, and time. Exhibits muscle wasting.  The patient was examined in the wheelchair.  Skin: No vesicles. Dry skin. Skin is warm and dry. Not diaphoretic. No pallor.  Psychiatric: Mood, memory and judgment normal. Vitals reviewed.  LABORATORY DATA: Lab Results  Component Value Date   WBC 8.2 10/30/2021   HGB 11.7 (L) 10/30/2021   HCT 33.4 (L) 10/30/2021   MCV 92.3 10/30/2021   PLT 226 10/30/2021      Chemistry      Component Value Date/Time   NA 135 10/30/2021 1338   K 3.9 10/30/2021 1338   CL 103 10/30/2021 1338   CO2 26 10/30/2021 1338   BUN 15 10/30/2021 1338   CREATININE 0.65 10/30/2021 1338   CREATININE 0.76 12/21/2019 1100      Component Value Date/Time   CALCIUM 9.0 10/30/2021 1338   ALKPHOS 75 10/30/2021 1338   AST 20 10/30/2021 1338   AST 22 12/21/2019 1100   ALT 13 10/30/2021 1338   ALT 13 12/21/2019 1100   BILITOT 0.5 10/30/2021 1338   BILITOT 0.3 12/21/2019 1100       RADIOGRAPHIC STUDIES:  No results found.   ASSESSMENT/PLAN:  This is a very pleasant 81 year old Caucasian female diagnosed with extensive stage (T1b, N3, M1 C) small cell lung cancer.  She presented with right upper lobe pulmonary nodules in addition to bilateral hilar and right mediastinal as well as left supraclavicular and abdominal lymphadenopathy.  She also has metastatic disease to the in the left humerus.  She was diagnosed in May 2020.   She is status post palliative radiotherapy to the left proximal humerus under the care of Dr. Lisbeth Renshaw in 2020.   She is currently undergoing palliative systemic chemotherapy with carboplatin for AUC of 5 on day 1, etoposide 100 mg/M2 on days 1, 2 and 3 as well as Imfinzi 1500 mg every 3 weeks with the chemotherapy and Neulasta support. Status post 34  cycles. Starting from cycle #5 the patient has been treated with maintenance treatment with Imfinzi 1500 mg IV every 4 weeks.   She had a pathological fracture in the left humerus underwent additional radiation to this area. Last treatment was 04/21/20.   The patient's most recent CT scan reported  increase in size of pretracheal lymph nodes.  Dr. Julien Nordmann personally compared the current images to the previous one and he did not see a significant change from the previous scan but we will continue to monitor disease mediastinal lymph nodes closely on the upcoming imaging studies.  I will arrange for her next restaging CT scan before her next  visit.  I discussed the rationale for the oral contrast for her scan. I discussed alternative options such as drinking the watered down contrast at the radiology department before her scan. The patient refuses oral contrast. Therefore, I will arrange for her CT of the chest, abdomen, and pelvis to have IV contrast but ok to proceed without oral contrast.    Labs were reviewed.  Recommend that she proceed with cycle #37 today scheduled.  We will see her back for follow-up visit in 4 weeks for evaluation and to review her scan before starting cycle #38.  The patient was advised to call immediately if she has any concerning symptoms in the interval. The patient voices understanding of current disease status and treatment options and is in agreement with the current care plan. All questions were answered. The patient knows to call the clinic with any problems, questions or concerns. We can certainly see the patient much sooner if necessary  No orders of the defined types were placed in this encounter.     The total time spent in the appointment was 20-29 minutes.   Craige Patel L Seanne Chirico, PA-C 11/27/21

## 2021-11-29 ENCOUNTER — Encounter: Payer: Self-pay | Admitting: Physician Assistant

## 2021-11-29 ENCOUNTER — Inpatient Hospital Stay (HOSPITAL_BASED_OUTPATIENT_CLINIC_OR_DEPARTMENT_OTHER): Payer: Federal, State, Local not specified - PPO | Admitting: Physician Assistant

## 2021-11-29 ENCOUNTER — Inpatient Hospital Stay: Payer: Federal, State, Local not specified - PPO | Attending: Oncology

## 2021-11-29 ENCOUNTER — Encounter: Payer: Self-pay | Admitting: Oncology

## 2021-11-29 ENCOUNTER — Encounter: Payer: Self-pay | Admitting: Internal Medicine

## 2021-11-29 ENCOUNTER — Inpatient Hospital Stay: Payer: Federal, State, Local not specified - PPO

## 2021-11-29 ENCOUNTER — Other Ambulatory Visit: Payer: Self-pay

## 2021-11-29 VITALS — BP 139/60 | HR 71 | Temp 96.6°F | Resp 18 | Ht 62.0 in | Wt 138.0 lb

## 2021-11-29 DIAGNOSIS — Z95828 Presence of other vascular implants and grafts: Secondary | ICD-10-CM

## 2021-11-29 DIAGNOSIS — C3411 Malignant neoplasm of upper lobe, right bronchus or lung: Secondary | ICD-10-CM

## 2021-11-29 DIAGNOSIS — C7951 Secondary malignant neoplasm of bone: Secondary | ICD-10-CM | POA: Insufficient documentation

## 2021-11-29 DIAGNOSIS — Z79899 Other long term (current) drug therapy: Secondary | ICD-10-CM | POA: Insufficient documentation

## 2021-11-29 DIAGNOSIS — R5383 Other fatigue: Secondary | ICD-10-CM | POA: Insufficient documentation

## 2021-11-29 DIAGNOSIS — K5900 Constipation, unspecified: Secondary | ICD-10-CM | POA: Diagnosis not present

## 2021-11-29 DIAGNOSIS — C778 Secondary and unspecified malignant neoplasm of lymph nodes of multiple regions: Secondary | ICD-10-CM | POA: Diagnosis not present

## 2021-11-29 DIAGNOSIS — Z1211 Encounter for screening for malignant neoplasm of colon: Secondary | ICD-10-CM

## 2021-11-29 DIAGNOSIS — Z5112 Encounter for antineoplastic immunotherapy: Secondary | ICD-10-CM | POA: Diagnosis present

## 2021-11-29 DIAGNOSIS — Z923 Personal history of irradiation: Secondary | ICD-10-CM | POA: Diagnosis not present

## 2021-11-29 DIAGNOSIS — R937 Abnormal findings on diagnostic imaging of other parts of musculoskeletal system: Secondary | ICD-10-CM

## 2021-11-29 DIAGNOSIS — C3490 Malignant neoplasm of unspecified part of unspecified bronchus or lung: Secondary | ICD-10-CM

## 2021-11-29 LAB — COMPREHENSIVE METABOLIC PANEL
ALT: 12 U/L (ref 0–44)
AST: 19 U/L (ref 15–41)
Albumin: 3.7 g/dL (ref 3.5–5.0)
Alkaline Phosphatase: 75 U/L (ref 38–126)
Anion gap: 7 (ref 5–15)
BUN: 18 mg/dL (ref 8–23)
CO2: 28 mmol/L (ref 22–32)
Calcium: 9.1 mg/dL (ref 8.9–10.3)
Chloride: 103 mmol/L (ref 98–111)
Creatinine, Ser: 0.74 mg/dL (ref 0.44–1.00)
GFR, Estimated: >60 mL/min (ref >60)
Glucose, Bld: 115 mg/dL — ABNORMAL HIGH (ref 70–99)
Potassium: 3.8 mmol/L (ref 3.5–5.1)
Sodium: 138 mmol/L (ref 135–145)
Total Bilirubin: 0.4 mg/dL (ref 0.3–1.2)
Total Protein: 7 g/dL (ref 6.5–8.1)

## 2021-11-29 LAB — CBC WITH DIFFERENTIAL/PLATELET
Abs Immature Granulocytes: 0.02 10*3/uL (ref 0.00–0.07)
Basophils Absolute: 0.1 10*3/uL (ref 0.0–0.1)
Basophils Relative: 1 %
Eosinophils Absolute: 0.3 10*3/uL (ref 0.0–0.5)
Eosinophils Relative: 4 %
HCT: 35.7 % — ABNORMAL LOW (ref 36.0–46.0)
Hemoglobin: 11.8 g/dL — ABNORMAL LOW (ref 12.0–15.0)
Immature Granulocytes: 0 %
Lymphocytes Relative: 18 %
Lymphs Abs: 1.5 10*3/uL (ref 0.7–4.0)
MCH: 31 pg (ref 26.0–34.0)
MCHC: 33.1 g/dL (ref 30.0–36.0)
MCV: 93.7 fL (ref 80.0–100.0)
Monocytes Absolute: 0.7 10*3/uL (ref 0.1–1.0)
Monocytes Relative: 9 %
Neutro Abs: 5.7 10*3/uL (ref 1.7–7.7)
Neutrophils Relative %: 68 %
Platelets: 250 10*3/uL (ref 150–400)
RBC: 3.81 MIL/uL — ABNORMAL LOW (ref 3.87–5.11)
RDW: 13.2 % (ref 11.5–15.5)
WBC: 8.3 10*3/uL (ref 4.0–10.5)
nRBC: 0 % (ref 0.0–0.2)

## 2021-11-29 LAB — TSH: TSH: 2.023 u[IU]/mL (ref 0.308–3.960)

## 2021-11-29 MED ORDER — SODIUM CHLORIDE 0.9 % IV SOLN: Freq: Once | INTRAVENOUS | Status: AC

## 2021-11-29 MED ORDER — SODIUM CHLORIDE 0.9% FLUSH
10.0000 mL | INTRAVENOUS | Status: DC | PRN
  Administered 2021-11-29: 10 mL via INTRAVENOUS

## 2021-11-29 MED ORDER — SODIUM CHLORIDE 0.9 % IV SOLN
1500.0000 mg | Freq: Once | INTRAVENOUS | Status: AC
  Administered 2021-11-29: 1500 mg via INTRAVENOUS
  Filled 2021-11-29: qty 30

## 2021-11-30 ENCOUNTER — Telehealth: Payer: Self-pay | Admitting: Physician Assistant

## 2021-11-30 NOTE — Telephone Encounter (Signed)
Added appointments per 02/22 los. Left message with details.

## 2021-12-22 ENCOUNTER — Other Ambulatory Visit: Payer: Self-pay

## 2021-12-22 ENCOUNTER — Ambulatory Visit (HOSPITAL_COMMUNITY)
Admission: RE | Admit: 2021-12-22 | Discharge: 2021-12-22 | Disposition: A | Discharge status: Home / Self Care | Payer: Federal, State, Local not specified - PPO | Source: Ambulatory Visit | Attending: Physician Assistant | Admitting: Physician Assistant

## 2021-12-22 DIAGNOSIS — C3490 Malignant neoplasm of unspecified part of unspecified bronchus or lung: Secondary | ICD-10-CM | POA: Insufficient documentation

## 2021-12-22 DIAGNOSIS — C3411 Malignant neoplasm of upper lobe, right bronchus or lung: Secondary | ICD-10-CM

## 2021-12-22 MED ORDER — HEPARIN SOD (PORK) LOCK FLUSH 100 UNIT/ML IV SOLN
INTRAVENOUS | Status: AC
  Administered 2021-12-22: 500 [IU]
  Filled 2021-12-22: qty 5

## 2021-12-22 MED ORDER — IOHEXOL 300 MG/ML  SOLN
100.0000 mL | Freq: Once | INTRAMUSCULAR | Status: AC | PRN
  Administered 2021-12-22: 100 mL via INTRAVENOUS

## 2021-12-22 MED ORDER — SODIUM CHLORIDE (PF) 0.9 % IJ SOLN
INTRAMUSCULAR | Status: AC
  Filled 2021-12-22: qty 50

## 2021-12-25 ENCOUNTER — Inpatient Hospital Stay (HOSPITAL_BASED_OUTPATIENT_CLINIC_OR_DEPARTMENT_OTHER): Payer: Federal, State, Local not specified - PPO | Admitting: Internal Medicine

## 2021-12-25 ENCOUNTER — Encounter: Payer: Self-pay | Admitting: Internal Medicine

## 2021-12-25 ENCOUNTER — Inpatient Hospital Stay: Payer: Federal, State, Local not specified - PPO

## 2021-12-25 ENCOUNTER — Other Ambulatory Visit: Payer: Self-pay

## 2021-12-25 ENCOUNTER — Inpatient Hospital Stay: Payer: Federal, State, Local not specified - PPO | Attending: Oncology

## 2021-12-25 VITALS — BP 121/69 | HR 86 | Temp 97.6°F | Resp 18 | Wt 138.4 lb

## 2021-12-25 DIAGNOSIS — C7951 Secondary malignant neoplasm of bone: Secondary | ICD-10-CM | POA: Insufficient documentation

## 2021-12-25 DIAGNOSIS — J479 Bronchiectasis, uncomplicated: Secondary | ICD-10-CM | POA: Diagnosis not present

## 2021-12-25 DIAGNOSIS — Z5112 Encounter for antineoplastic immunotherapy: Secondary | ICD-10-CM | POA: Insufficient documentation

## 2021-12-25 DIAGNOSIS — I2510 Atherosclerotic heart disease of native coronary artery without angina pectoris: Secondary | ICD-10-CM | POA: Insufficient documentation

## 2021-12-25 DIAGNOSIS — Z791 Long term (current) use of non-steroidal anti-inflammatories (NSAID): Secondary | ICD-10-CM | POA: Insufficient documentation

## 2021-12-25 DIAGNOSIS — C3411 Malignant neoplasm of upper lobe, right bronchus or lung: Secondary | ICD-10-CM

## 2021-12-25 DIAGNOSIS — N1330 Unspecified hydronephrosis: Secondary | ICD-10-CM | POA: Insufficient documentation

## 2021-12-25 DIAGNOSIS — R5383 Other fatigue: Secondary | ICD-10-CM | POA: Insufficient documentation

## 2021-12-25 DIAGNOSIS — Z79899 Other long term (current) drug therapy: Secondary | ICD-10-CM | POA: Diagnosis not present

## 2021-12-25 DIAGNOSIS — K5900 Constipation, unspecified: Secondary | ICD-10-CM | POA: Diagnosis not present

## 2021-12-25 DIAGNOSIS — I700 Atherosclerosis of aorta: Secondary | ICD-10-CM | POA: Insufficient documentation

## 2021-12-25 DIAGNOSIS — R531 Weakness: Secondary | ICD-10-CM | POA: Diagnosis not present

## 2021-12-25 DIAGNOSIS — J8410 Pulmonary fibrosis, unspecified: Secondary | ICD-10-CM | POA: Diagnosis not present

## 2021-12-25 DIAGNOSIS — J432 Centrilobular emphysema: Secondary | ICD-10-CM | POA: Insufficient documentation

## 2021-12-25 DIAGNOSIS — Z95828 Presence of other vascular implants and grafts: Secondary | ICD-10-CM

## 2021-12-25 LAB — CBC WITH DIFFERENTIAL (CANCER CENTER ONLY)
Abs Immature Granulocytes: 0.01 10*3/uL (ref 0.00–0.07)
Basophils Absolute: 0 10*3/uL (ref 0.0–0.1)
Basophils Relative: 1 %
Eosinophils Absolute: 0.3 10*3/uL (ref 0.0–0.5)
Eosinophils Relative: 4 %
HCT: 35.1 % — ABNORMAL LOW (ref 36.0–46.0)
Hemoglobin: 11.8 g/dL — ABNORMAL LOW (ref 12.0–15.0)
Immature Granulocytes: 0 %
Lymphocytes Relative: 17 %
Lymphs Abs: 1.2 10*3/uL (ref 0.7–4.0)
MCH: 31.6 pg (ref 26.0–34.0)
MCHC: 33.6 g/dL (ref 30.0–36.0)
MCV: 93.9 fL (ref 80.0–100.0)
Monocytes Absolute: 0.7 10*3/uL (ref 0.1–1.0)
Monocytes Relative: 10 %
Neutro Abs: 5.2 10*3/uL (ref 1.7–7.7)
Neutrophils Relative %: 68 %
Platelet Count: 241 10*3/uL (ref 150–400)
RBC: 3.74 MIL/uL — ABNORMAL LOW (ref 3.87–5.11)
RDW: 13.2 % (ref 11.5–15.5)
WBC Count: 7.5 10*3/uL (ref 4.0–10.5)
nRBC: 0 % (ref 0.0–0.2)

## 2021-12-25 LAB — CMP (CANCER CENTER ONLY)
ALT: 13 U/L (ref 0–44)
AST: 20 U/L (ref 15–41)
Albumin: 3.8 g/dL (ref 3.5–5.0)
Alkaline Phosphatase: 76 U/L (ref 38–126)
Anion gap: 7 (ref 5–15)
BUN: 18 mg/dL (ref 8–23)
CO2: 27 mmol/L (ref 22–32)
Calcium: 9.1 mg/dL (ref 8.9–10.3)
Chloride: 100 mmol/L (ref 98–111)
Creatinine: 0.71 mg/dL (ref 0.44–1.00)
GFR, Estimated: >60 mL/min (ref >60)
Glucose, Bld: 129 mg/dL — ABNORMAL HIGH (ref 70–99)
Potassium: 4 mmol/L (ref 3.5–5.1)
Sodium: 134 mmol/L — ABNORMAL LOW (ref 135–145)
Total Bilirubin: 0.4 mg/dL (ref 0.3–1.2)
Total Protein: 7.2 g/dL (ref 6.5–8.1)

## 2021-12-25 LAB — TSH: TSH: 0.826 u[IU]/mL (ref 0.308–3.960)

## 2021-12-25 MED ORDER — SODIUM CHLORIDE 0.9 % IV SOLN
1500.0000 mg | Freq: Once | INTRAVENOUS | Status: AC
  Administered 2021-12-25: 1500 mg via INTRAVENOUS
  Filled 2021-12-25: qty 30

## 2021-12-25 MED ORDER — ALTEPLASE 2 MG IJ SOLR: 2.0000 mg | Freq: Once | INTRAMUSCULAR | Status: DC | PRN

## 2021-12-25 MED ORDER — SODIUM CHLORIDE 0.9 % IV SOLN: Freq: Once | INTRAVENOUS | Status: AC

## 2021-12-25 MED ORDER — HEPARIN SOD (PORK) LOCK FLUSH 100 UNIT/ML IV SOLN: 250.0000 [IU] | Freq: Once | INTRAVENOUS | Status: DC | PRN

## 2021-12-25 MED ORDER — HEPARIN SOD (PORK) LOCK FLUSH 100 UNIT/ML IV SOLN
500.0000 [IU] | Freq: Once | INTRAVENOUS | Status: AC | PRN
  Administered 2021-12-25: 500 [IU]

## 2021-12-25 MED ORDER — SODIUM CHLORIDE 0.9% FLUSH: 3.0000 mL | INTRAVENOUS | Status: DC | PRN

## 2021-12-25 MED ORDER — SODIUM CHLORIDE 0.9% FLUSH
10.0000 mL | INTRAVENOUS | Status: DC | PRN
  Administered 2021-12-25: 10 mL via INTRAVENOUS

## 2021-12-25 MED ORDER — SODIUM CHLORIDE 0.9% FLUSH
10.0000 mL | INTRAVENOUS | Status: DC | PRN
  Administered 2021-12-25: 10 mL

## 2021-12-25 NOTE — Progress Notes (Signed)
?    [facility] ?[phone]   [phone] ? ?OFFICE PROGRESS NOTE ? ?Pcp, No ?No address on file ? 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 ?  ?PRIOR THERAPY: None ?  ?CURRENT THERAPY: Palliative systemic chemotherapy with carboplatin for AUC of 5 on day 1, etoposide 100 mg/M2 on days 1, 2 and 3 as well as Imfinzi 1500 mg every 3 weeks with the chemotherapy and Neulasta support. First dose on 03/16/2019. Status post 37  cycles.   Starting from cycle #5 the patient will be treated with maintenance treatment with Imfinzi 1500 mg IV every 4 weeks. ? ?INTERVAL HISTORY: ?Brittany Marks 81 y.o. female returns to the clinic today for follow-up visit.  The patient is feeling fine today with no concerning complaints except for the baseline fatigue and weakness.  She denied having any current chest pain, shortness of breath, cough or hemoptysis.  She denied having any nausea, vomiting, diarrhea or constipation.  She has no headache or visual changes.  She denied having any recent weight loss or night sweats.  She has been tolerating her treatment with Imfinzi fairly well.  She is here today for evaluation with repeat CT scan of the chest, abdomen and pelvis for restaging of her disease. ? ? ?MEDICAL HISTORY: ?Past Medical History:  ?Diagnosis Date  ? Cataract   ? Bilateral  ? Constipation   ? pain medication  ? Left rotator cuff tear   ? Right rotator cuff tear   ? SCL CA dx'd 11/2018  ? ? ?ALLERGIES:  has No Known Allergies. ? ?MEDICATIONS:  ?Current Outpatient Medications  ?Medication Sig Dispense Refill  ? Ascorbic Acid (VITAMIN C PO) Take by mouth daily.     ? b complex vitamins tablet Take 1 tablet by mouth daily.    ? COD  LIVER OIL PO Take 1 tablet by mouth daily.     ? cyclobenzaprine (FLEXERIL) 10 MG tablet Take 1 tablet (10 mg total) by mouth 3 (three) times daily as needed for muscle spasms. 30 tablet 1  ? gabapentin (NEURONTIN) 100 MG capsule Take 2 capsules (200 mg total) by mouth 2 (two) times daily. 90 capsule 2  ? HYDROcodone-acetaminophen (NORCO) 5-325 MG tablet Take 1 tablet by mouth every 6 (six) hours as needed for moderate pain. 20 tablet 0  ? hydrocortisone 2.5 % cream Apply topically as needed. (Patient taking differently: Apply 1 application topically daily as needed (Treatment).) 3.5 g 0  ? ibuprofen (ADVIL) 200 MG tablet Take 600 mg by mouth daily.    ? lidocaine-prilocaine (EMLA) cream Apply 1 application topically every 30 (thirty) days.     ? Multiple Vitamin (MULTIVITAMIN) tablet Take 1 tablet by mouth daily.    ? ondansetron (ZOFRAN) 4 MG tablet Take 1 tablet (4 mg total) by mouth every 8 (eight) hours as needed for nausea or vomiting. 10 tablet 0  ? oxymetazoline (AFRIN) 0.05 % nasal spray Place 2 sprays into the nose 2 (two) times daily as needed (Bloody nose).     ? prochlorperazine (COMPAZINE) 10 MG tablet Take 1 tablet (10 mg total) by mouth every 6 (six) hours as needed for nausea or vomiting. 30 tablet 0  ? silver sulfADIAZINE (SILVADENE) 1 % cream Apply  1 application topically 2 (two) times daily. 50 g 1  ? sodium chloride (OCEAN) 0.65 % nasal spray Place 1 spray into the nose at bedtime.    ? VITAMIN E PO Take 1 tablet by mouth daily.     ? ?No current facility-administered medications for this visit.  ? ?Facility-Administered Medications Ordered in Other Visits  ?Medication Dose Route Frequency Provider Last Rate Last Admin  ? heparin lock flush 100 unit/mL  500 Units Intracatheter Once PRN Magrinat, Virgie Dad, MD      ? sodium chloride flush (NS) 0.9 % injection 10 mL  10 mL Intracatheter PRN Magrinat, Virgie Dad, MD      ? ? ?SURGICAL HISTORY:  ?Past Surgical History:  ?Procedure Laterality Date  ?  CATARACT EXTRACTION W/ INTRAOCULAR LENS  IMPLANT, BILATERAL  2012  ? DECOMPRESSIVE LUMBAR LAMINECTOMY LEVEL 2 N/A 07/29/2013  ? Procedure: LUMBAR LAMINECTOMY, DECOMPRESSION LUMBAR THREE TO FOUR, FOUR TO FIVE microdiscectomy l3,4 right;  Surgeon: Tobi Bastos, MD;  Location: [facility];  Service: Orthopedics;  Laterality: N/A;  ? I & D SUPERIOR RIGHT SHOULDER AND CLOSURE WOUND  01-10-2011  ? S/P ROTATOR CUFF REPAIR  ? IR IMAGING GUIDED PORT INSERTION  04/09/2019  ? LUMBAR LAMINECTOMY  1970'S  ? LUMBAR LAMINECTOMY/DECOMPRESSION MICRODISCECTOMY N/A 06/27/2016  ? Procedure: L1 - L2 DISCECTOMY;  Surgeon: Melina Schools, MD;  Location: [facility];  Service: Orthopedics;  Laterality: N/A;  ? [place]  ? REVERSE SHOULDER ARTHROPLASTY Left 03/10/2020  ? Procedure: REVERSE SHOULDER ARTHROPLASTY;  Surgeon: Justice Britain, MD;  Location: [facility];  Service: Orthopedics;  Laterality: Left;  149min  ? RIGHT SHOULDER ARTHROSCOPY/ OPEN DISTAL CLAVICLE RESECTION/ SAD/ OPEN ROTATOR CUFF REPAIR  11-28-2010  ? SHOULDER FUSION SURGERY  03/11/2020  ? Shoulder Surgery , hardware placed  ? SHOULDER OPEN ROTATOR CUFF REPAIR  12/19/2011  ? Procedure: ROTATOR CUFF REPAIR SHOULDER OPEN;  Surgeon: Magnus Sinning, MD;  Location: [facility];  Service: Orthopedics;  Laterality: Right;  RIGHT RECURRENT OPEN REPAIR OF THE ROTATOR CUFF WITH TISSUE MEND GRAFTANTERIOR CHROMIOECTOMY  ? VAGINAL HYSTERECTOMY  1979  ? ? ?REVIEW OF SYSTEMS:  Constitutional: positive for fatigue ?Eyes: negative ?Ears, nose, mouth, throat, and face: negative ?Respiratory: negative ?Cardiovascular: negative ?Gastrointestinal: negative ?Genitourinary:negative ?Integument/breast: negative ?Hematologic/lymphatic: negative ?Musculoskeletal:positive for muscle weakness ?Neurological: negative ?Behavioral/Psych: negative ?Endocrine: negative ?Allergic/Immunologic: negative  ? ?PHYSICAL EXAMINATION: General appearance: alert, cooperative, fatigued, and no  distress ?Head: Normocephalic, without obvious abnormality, atraumatic ?Neck: no adenopathy, no JVD, supple, symmetrical, trachea midline, and thyroid not enlarged, symmetric, no tenderness/mass/nodules ?Lymph nodes: Cervical, supraclavicular, and axillary nodes normal. ?Resp: clear to auscultation bilaterally ?Back: symmetric, no curvature. ROM normal. No CVA tenderness. ?Cardio: regular rate and rhythm, S1, S2 normal, no murmur, click, rub or gallop ?GI: soft, non-tender; bowel sounds normal; no masses,  no organomegaly ?Extremities: extremities normal, atraumatic, no cyanosis or edema ?Neurologic: Alert and oriented X 3, normal strength and tone. Normal symmetric reflexes. Normal coordination and gait  ? ?ECOG PERFORMANCE STATUS: 1 - Symptomatic but completely ambulatory ? ?Blood pressure 121/69, pulse 86, temperature 97.6 ?F (36.4 ?C), temperature source Tympanic, resp. rate 18, weight 138 lb 6 oz (62.8 kg), SpO2 100 %. ? ?LABORATORY DATA: ?Lab Results  ?Component Value Date  ? WBC 7.5 12/25/2021  ? HGB 11.8 (L) 12/25/2021  ? HCT 35.1 (L) 12/25/2021  ? MCV 93.9 12/25/2021  ? PLT 241 12/25/2021  ? ? ?  Chemistry   ?   ?  Component Value Date/Time  ? NA 138 11/29/2021 1136  ? K 3.8 11/29/2021 1136  ? CL 103 11/29/2021 1136  ? CO2 28 11/29/2021 1136  ? BUN 18 11/29/2021 1136  ? CREATININE 0.74 11/29/2021 1136  ? CREATININE 0.76 12/21/2019 1100  ?    ?Component Value Date/Time  ? CALCIUM 9.1 11/29/2021 1136  ? ALKPHOS 75 11/29/2021 1136  ? AST 19 11/29/2021 1136  ? AST 22 12/21/2019 1100  ? ALT 12 11/29/2021 1136  ? ALT 13 12/21/2019 1100  ? BILITOT 0.4 11/29/2021 1136  ? BILITOT 0.3 12/21/2019 1100  ?  ? ? ? ?RADIOGRAPHIC STUDIES: ?CT Chest W Contrast ? ?Result Date: 12/24/2021 ?CLINICAL DATA:  Small-cell lung cancer restaging, assess treatment response * Tracking Code: BO * EXAM: CT CHEST, ABDOMEN, AND PELVIS WITH CONTRAST TECHNIQUE: Multidetector CT imaging of the chest, abdomen and pelvis was performed following  the standard protocol during bolus administration of intravenous contrast. RADIATION DOSE REDUCTION: This exam was performed according to the departmental dose-optimization program which includes automated exposure

## 2021-12-25 NOTE — Patient Instructions (Signed)
[facility]  Discharge Instructions: Thank you for choosing [facility] to provide your oncology and hematology care.   If you have a lab appointment with the Cancer Center, please go directly to the Cancer Center and check in at the registration area.   Wear comfortable clothing and clothing appropriate for easy access to any Portacath or PICC line.   We strive to give you quality time with your provider. You may need to reschedule your appointment if you arrive late (15 or more minutes).  Arriving late affects you and other patients whose appointments are after yours.  Also, if you miss three or more appointments without notifying the office, you may be dismissed from the clinic at the provider's discretion.      For prescription refill requests, have your pharmacy contact our office and allow 72 hours for refills to be completed.    Today you received the following chemotherapy and/or immunotherapy agents Imfinzi      To help prevent nausea and vomiting after your treatment, we encourage you to take your nausea medication as directed.  BELOW ARE SYMPTOMS THAT SHOULD BE REPORTED IMMEDIATELY: *FEVER GREATER THAN 100.4 F (38 C) OR HIGHER *CHILLS OR SWEATING *NAUSEA AND VOMITING THAT IS NOT CONTROLLED WITH YOUR NAUSEA MEDICATION *UNUSUAL SHORTNESS OF BREATH *UNUSUAL BRUISING OR BLEEDING *URINARY PROBLEMS (pain or burning when urinating, or frequent urination) *BOWEL PROBLEMS (unusual diarrhea, constipation, pain near the anus) TENDERNESS IN MOUTH AND THROAT WITH OR WITHOUT PRESENCE OF ULCERS (sore throat, sores in mouth, or a toothache) UNUSUAL RASH, SWELLING OR PAIN  UNUSUAL VAGINAL DISCHARGE OR ITCHING   Items with * indicate a potential emergency and should be followed up as soon as possible or go to the Emergency Department if any problems should occur.  Please show the CHEMOTHERAPY ALERT CARD or IMMUNOTHERAPY ALERT CARD at check-in to the  Emergency Department and triage nurse.  Should you have questions after your visit or need to cancel or reschedule your appointment, please contact [facility]  Dept: [phone]  and follow the prompts.  Office hours are 8:00 a.m. to 4:30 p.m. Monday - Friday. Please note that voicemails left after 4:00 p.m. may not be returned until the following business day.  We are closed weekends and major holidays. You have access to a nurse at all times for urgent questions. Please call the main number to the clinic Dept: [phone] and follow the prompts.   For any non-urgent questions, you may also contact your provider using MyChart. We now offer e-Visits for anyone 18 and older to request care online for non-urgent symptoms. For details visit [email or web address].   Also download the MyChart app! Go to the app store, search "MyChart", open the app, select , and log in with your MyChart username and password.  Due to Covid, a mask is required upon entering the hospital/clinic. If you do not have a mask, one will be given to you upon arrival. For doctor visits, patients may have 1 support person aged 18 or older with them. For treatment visits, patients cannot have anyone with them due to current Covid guidelines and our immunocompromised population.   

## 2021-12-25 NOTE — Patient Instructions (Signed)
Steps to Quit Smoking Smoking tobacco is the leading cause of preventable death. It can affect almost every organ in the body. Smoking puts you and people around you at risk for many serious, long-lasting (chronic) diseases. Quitting smoking can be hard, but it is one of the best things that you can do for your health. It is never too late to quit. How do I get ready to quit? When you decide to quit smoking, make a plan to help you succeed. Before you quit: Pick a date to quit. Set a date within the next 2 weeks to give you time to prepare. Write down the reasons why you are quitting. Keep this list in places where you will see it often. Tell your family, friends, and co-workers that you are quitting. Their support is important. Talk with your doctor about the choices that may help you quit. Find out if your health insurance will pay for these treatments. Know the people, places, things, and activities that make you want to smoke (triggers). Avoid them. What first steps can I take to quit smoking? Throw away all cigarettes at home, at work, and in your car. Throw away the things that you use when you smoke, such as ashtrays and lighters. Clean your car. Make sure to empty the ashtray. Clean your home, including curtains and carpets. What can I do to help me quit smoking? Talk with your doctor about taking medicines and seeing a counselor at the same time. You are more likely to succeed when you do both. If you are pregnant or breastfeeding, talk with your doctor about counseling or other ways to quit smoking. Do not take medicine to help you quit smoking unless your doctor tells you to do so. To quit smoking: Quit right away Quit smoking totally, instead of slowly cutting back on how much you smoke over a period of time. Go to counseling. You are more likely to quit if you go to counseling sessions regularly. Take medicine You may take medicines to help you quit. Some medicines need a  prescription, and some you can buy over-the-counter. Some medicines may contain a drug called nicotine to replace the nicotine in cigarettes. Medicines may: Help you to stop having the desire to smoke (cravings). Help to stop the problems that come when you stop smoking (withdrawal symptoms). Your doctor may ask you to use: Nicotine patches, gum, or lozenges. Nicotine inhalers or sprays. Non-nicotine medicine that is taken by mouth. Find resources Find resources and other ways to help you quit smoking and remain smoke-free after you quit. These resources are most helpful when you use them often. They include: Online chats with a counselor. Phone quitlines. Printed self-help materials. Support groups or group counseling. Text messaging programs. Mobile phone apps. Use apps on your mobile phone or tablet that can help you stick to your quit plan. There are many free apps for mobile phones and tablets as well as websites. Examples include Quit Guide from the CDC and smokefree.gov  What things can I do to make it easier to quit?  Talk to your family and friends. Ask them to support and encourage you. Call a phone quitline (1-800-QUIT-NOW), reach out to support groups, or work with a counselor. Ask people who smoke to not smoke around you. Avoid places that make you want to smoke, such as: Bars. Parties. Smoke-break areas at work. Spend time with people who do not smoke. Lower the stress in your life. Stress can make you want to   smoke. Try these things to help your stress: Getting regular exercise. Doing deep-breathing exercises. Doing yoga. Meditating. Doing a body scan. To do this, close your eyes, focus on one area of your body at a time from head to toe. Notice which parts of your body are tense. Try to relax the muscles in those areas. How will I feel when I quit smoking? Day 1 to 3 weeks Within the first 24 hours, you may start to have some problems that come from quitting tobacco.  These problems are very bad 2-3 days after you quit, but they do not often last for more than 2-3 weeks. You may get these symptoms: Mood swings. Feeling restless, nervous, angry, or annoyed. Trouble concentrating. Dizziness. Strong desire for high-sugar foods and nicotine. Weight gain. Trouble pooping (constipation). Feeling like you may vomit (nausea). Coughing or a sore throat. Changes in how the medicines that you take for other issues work in your body. Depression. Trouble sleeping (insomnia). Week 3 and afterward After the first 2-3 weeks of quitting, you may start to notice more positive results, such as: Better sense of smell and taste. Less coughing and sore throat. Slower heart rate. Lower blood pressure. Clearer skin. Better breathing. Fewer sick days. Quitting smoking can be hard. Do not give up if you fail the first time. Some people need to try a few times before they succeed. Do your best to stick to your quit plan, and talk with your doctor if you have any questions or concerns. Summary Smoking tobacco is the leading cause of preventable death. Quitting smoking can be hard, but it is one of the best things that you can do for your health. When you decide to quit smoking, make a plan to help you succeed. Quit smoking right away, not slowly over a period of time. When you start quitting, seek help from your doctor, family, or friends. This information is not intended to replace advice given to you by your health care provider. Make sure you discuss any questions you have with your health care provider. Document Revised: 06/02/2021 Document Reviewed: 12/13/2018 Elsevier Patient Education  2022 Elsevier Inc.  

## 2022-01-02 ENCOUNTER — Telehealth: Payer: Self-pay | Admitting: Internal Medicine

## 2022-01-02 NOTE — Telephone Encounter (Signed)
.  Called pt per 3/28 inbasket , Patient was unavailable, a message with appt time and date was left with number on file.   ?

## 2022-01-22 ENCOUNTER — Encounter: Payer: Self-pay | Admitting: Internal Medicine

## 2022-01-22 ENCOUNTER — Inpatient Hospital Stay (HOSPITAL_BASED_OUTPATIENT_CLINIC_OR_DEPARTMENT_OTHER): Payer: Federal, State, Local not specified - PPO | Admitting: Internal Medicine

## 2022-01-22 ENCOUNTER — Inpatient Hospital Stay: Payer: Federal, State, Local not specified - PPO

## 2022-01-22 ENCOUNTER — Inpatient Hospital Stay: Payer: Federal, State, Local not specified - PPO | Attending: Oncology

## 2022-01-22 ENCOUNTER — Other Ambulatory Visit: Payer: Self-pay

## 2022-01-22 VITALS — BP 123/74 | HR 73 | Temp 95.8°F | Resp 17 | Ht 62.0 in | Wt 139.0 lb

## 2022-01-22 VITALS — Temp 97.9°F

## 2022-01-22 DIAGNOSIS — G629 Polyneuropathy, unspecified: Secondary | ICD-10-CM | POA: Insufficient documentation

## 2022-01-22 DIAGNOSIS — K5900 Constipation, unspecified: Secondary | ICD-10-CM | POA: Insufficient documentation

## 2022-01-22 DIAGNOSIS — Z95828 Presence of other vascular implants and grafts: Secondary | ICD-10-CM

## 2022-01-22 DIAGNOSIS — C3411 Malignant neoplasm of upper lobe, right bronchus or lung: Secondary | ICD-10-CM

## 2022-01-22 DIAGNOSIS — Z79899 Other long term (current) drug therapy: Secondary | ICD-10-CM | POA: Diagnosis not present

## 2022-01-22 DIAGNOSIS — Z5112 Encounter for antineoplastic immunotherapy: Secondary | ICD-10-CM | POA: Insufficient documentation

## 2022-01-22 DIAGNOSIS — M2550 Pain in unspecified joint: Secondary | ICD-10-CM | POA: Diagnosis not present

## 2022-01-22 LAB — CBC WITH DIFFERENTIAL (CANCER CENTER ONLY)
Abs Immature Granulocytes: 0.02 10*3/uL (ref 0.00–0.07)
Basophils Absolute: 0.1 10*3/uL (ref 0.0–0.1)
Basophils Relative: 1 %
Eosinophils Absolute: 0.3 10*3/uL (ref 0.0–0.5)
Eosinophils Relative: 3 %
HCT: 35.5 % — ABNORMAL LOW (ref 36.0–46.0)
Hemoglobin: 12.1 g/dL (ref 12.0–15.0)
Immature Granulocytes: 0 %
Lymphocytes Relative: 14 %
Lymphs Abs: 1.3 10*3/uL (ref 0.7–4.0)
MCH: 31.7 pg (ref 26.0–34.0)
MCHC: 34.1 g/dL (ref 30.0–36.0)
MCV: 92.9 fL (ref 80.0–100.0)
Monocytes Absolute: 0.7 10*3/uL (ref 0.1–1.0)
Monocytes Relative: 8 %
Neutro Abs: 6.6 10*3/uL (ref 1.7–7.7)
Neutrophils Relative %: 74 %
Platelet Count: 255 10*3/uL (ref 150–400)
RBC: 3.82 MIL/uL — ABNORMAL LOW (ref 3.87–5.11)
RDW: 13.2 % (ref 11.5–15.5)
WBC Count: 9 10*3/uL (ref 4.0–10.5)
nRBC: 0 % (ref 0.0–0.2)

## 2022-01-22 LAB — CMP (CANCER CENTER ONLY)
ALT: 13 U/L (ref 0–44)
AST: 20 U/L (ref 15–41)
Albumin: 3.7 g/dL (ref 3.5–5.0)
Alkaline Phosphatase: 77 U/L (ref 38–126)
Anion gap: 7 (ref 5–15)
BUN: 20 mg/dL (ref 8–23)
CO2: 27 mmol/L (ref 22–32)
Calcium: 8.8 mg/dL — ABNORMAL LOW (ref 8.9–10.3)
Chloride: 103 mmol/L (ref 98–111)
Creatinine: 0.69 mg/dL (ref 0.44–1.00)
GFR, Estimated: >60 mL/min (ref >60)
Glucose, Bld: 121 mg/dL — ABNORMAL HIGH (ref 70–99)
Potassium: 3.8 mmol/L (ref 3.5–5.1)
Sodium: 137 mmol/L (ref 135–145)
Total Bilirubin: 0.4 mg/dL (ref 0.3–1.2)
Total Protein: 7.3 g/dL (ref 6.5–8.1)

## 2022-01-22 LAB — TSH: TSH: 1.986 u[IU]/mL (ref 0.308–3.960)

## 2022-01-22 MED ORDER — HEPARIN SOD (PORK) LOCK FLUSH 100 UNIT/ML IV SOLN
500.0000 [IU] | Freq: Once | INTRAVENOUS | Status: AC | PRN
  Administered 2022-01-22: 500 [IU]

## 2022-01-22 MED ORDER — SODIUM CHLORIDE 0.9% FLUSH
10.0000 mL | INTRAVENOUS | Status: DC | PRN
  Administered 2022-01-22: 10 mL

## 2022-01-22 MED ORDER — SODIUM CHLORIDE 0.9 % IV SOLN
1500.0000 mg | Freq: Once | INTRAVENOUS | Status: AC
  Administered 2022-01-22: 1500 mg via INTRAVENOUS
  Filled 2022-01-22: qty 30

## 2022-01-22 MED ORDER — SODIUM CHLORIDE 0.9 % IV SOLN: Freq: Once | INTRAVENOUS | Status: AC

## 2022-01-22 NOTE — Progress Notes (Signed)
?    [place] ?[phone]   [phone] ? ?OFFICE PROGRESS NOTE ? ?Pcp, No ?No address on file ? 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 ?  ?PRIOR THERAPY: None ?  ?CURRENT THERAPY: Palliative systemic chemotherapy with carboplatin for AUC of 5 on day 1, etoposide 100 mg/M2 on days 1, 2 and 3 as well as Imfinzi 1500 mg every 3 weeks with the chemotherapy and Neulasta support. First dose on 03/16/2019. Status post 38  cycles.   Starting from cycle #5 the patient will be treated with maintenance treatment with Imfinzi 1500 mg IV every 4 weeks. ? ?INTERVAL HISTORY: ?Brittany Marks 81 y.o. female returns to the clinic today for follow-up visit.  The patient is feeling fine today with no concerning complaints except for pain on the left arm secondary to arthralgia.  She denied having any chest pain, shortness of breath, cough or hemoptysis.  She has no nausea, vomiting, diarrhea or constipation.  She has no headache or visual changes.  She continues to tolerate her treatment with Imfinzi fairly well.  The patient is here today for evaluation before starting cycle #39 of her treatment. ? ? ?MEDICAL HISTORY: ?Past Medical History:  ?Diagnosis Date  ? Cataract   ? Bilateral  ? Constipation   ? pain medication  ? Left rotator cuff tear   ? Right rotator cuff tear   ? SCL CA dx'd 11/2018  ? ? ?ALLERGIES:  has No Known Allergies. ? ?MEDICATIONS:  ?Current Outpatient Medications  ?Medication Sig Dispense Refill  ? Ascorbic Acid (VITAMIN C PO) Take by mouth daily.     ? b complex vitamins tablet Take 1 tablet by mouth daily.    ? COD LIVER OIL PO Take 1 tablet by mouth daily.     ? cyclobenzaprine (FLEXERIL) 10 MG tablet Take 1  tablet (10 mg total) by mouth 3 (three) times daily as needed for muscle spasms. 30 tablet 1  ? gabapentin (NEURONTIN) 100 MG capsule Take 2 capsules (200 mg total) by mouth 2 (two) times daily. 90 capsule 2  ? HYDROcodone-acetaminophen (NORCO) 5-325 MG tablet Take 1 tablet by mouth every 6 (six) hours as needed for moderate pain. 20 tablet 0  ? hydrocortisone 2.5 % cream Apply topically as needed. (Patient taking differently: Apply 1 application topically daily as needed (Treatment).) 3.5 g 0  ? ibuprofen (ADVIL) 200 MG tablet Take 600 mg by mouth daily.    ? lidocaine-prilocaine (EMLA) cream Apply 1 application topically every 30 (thirty) days.     ? Multiple Vitamin (MULTIVITAMIN) tablet Take 1 tablet by mouth daily.    ? ondansetron (ZOFRAN) 4 MG tablet Take 1 tablet (4 mg total) by mouth every 8 (eight) hours as needed for nausea or vomiting. 10 tablet 0  ? oxymetazoline (AFRIN) 0.05 % nasal spray Place 2 sprays into the nose 2 (two) times daily as needed (Bloody nose).     ? prochlorperazine (COMPAZINE) 10 MG tablet Take 1 tablet (10 mg total) by mouth every 6 (six) hours as needed for nausea or vomiting. 30 tablet 0  ? silver sulfADIAZINE (SILVADENE) 1 % cream Apply 1 application topically 2 (two) times daily. 50 g 1  ? sodium chloride (OCEAN) 0.65 %  nasal spray Place 1 spray into the nose at bedtime.    ? VITAMIN E PO Take 1 tablet by mouth daily.     ? ?No current facility-administered medications for this visit.  ? ?Facility-Administered Medications Ordered in Other Visits  ?Medication Dose Route Frequency Provider Last Rate Last Admin  ? heparin lock flush 100 unit/mL  500 Units Intracatheter Once PRN Magrinat, Virgie Dad, MD      ? sodium chloride flush (NS) 0.9 % injection 10 mL  10 mL Intracatheter PRN Magrinat, Virgie Dad, MD      ? sodium chloride flush (NS) 0.9 % injection 10 mL  10 mL Intracatheter PRN Curt Bears, MD   10 mL at 01/22/22 1345  ? ? ?SURGICAL HISTORY:  ?Past Surgical History:   ?Procedure Laterality Date  ? CATARACT EXTRACTION W/ INTRAOCULAR LENS  IMPLANT, BILATERAL  2012  ? DECOMPRESSIVE LUMBAR LAMINECTOMY LEVEL 2 N/A 07/29/2013  ? Procedure: LUMBAR LAMINECTOMY, DECOMPRESSION LUMBAR THREE TO FOUR, FOUR TO FIVE microdiscectomy l3,4 right;  Surgeon: Tobi Bastos, MD;  Location: [facility];  Service: Orthopedics;  Laterality: N/A;  ? I & D SUPERIOR RIGHT SHOULDER AND CLOSURE WOUND  01-10-2011  ? S/P ROTATOR CUFF REPAIR  ? IR IMAGING GUIDED PORT INSERTION  04/09/2019  ? LUMBAR LAMINECTOMY  1970'S  ? LUMBAR LAMINECTOMY/DECOMPRESSION MICRODISCECTOMY N/A 06/27/2016  ? Procedure: L1 - L2 DISCECTOMY;  Surgeon: Melina Schools, MD;  Location: [facility];  Service: Orthopedics;  Laterality: N/A;  ? [place]  ? REVERSE SHOULDER ARTHROPLASTY Left 03/10/2020  ? Procedure: REVERSE SHOULDER ARTHROPLASTY;  Surgeon: Justice Britain, MD;  Location: [facility];  Service: Orthopedics;  Laterality: Left;  147min  ? RIGHT SHOULDER ARTHROSCOPY/ OPEN DISTAL CLAVICLE RESECTION/ SAD/ OPEN ROTATOR CUFF REPAIR  11-28-2010  ? SHOULDER FUSION SURGERY  03/11/2020  ? Shoulder Surgery , hardware placed  ? SHOULDER OPEN ROTATOR CUFF REPAIR  12/19/2011  ? Procedure: ROTATOR CUFF REPAIR SHOULDER OPEN;  Surgeon: Magnus Sinning, MD;  Location: [facility];  Service: Orthopedics;  Laterality: Right;  RIGHT RECURRENT OPEN REPAIR OF THE ROTATOR CUFF WITH TISSUE MEND GRAFTANTERIOR CHROMIOECTOMY  ? VAGINAL HYSTERECTOMY  1979  ? ? ?REVIEW OF SYSTEMS:  A comprehensive review of systems was negative except for: Constitutional: positive for fatigue ?Musculoskeletal: positive for arthralgias  ? ?PHYSICAL EXAMINATION: General appearance: alert, cooperative, fatigued, and no distress ?Head: Normocephalic, without obvious abnormality, atraumatic ?Neck: no adenopathy, no JVD, supple, symmetrical, trachea midline, and thyroid not enlarged, symmetric, no tenderness/mass/nodules ?Lymph nodes: Cervical, supraclavicular, and  axillary nodes normal. ?Resp: clear to auscultation bilaterally ?Back: symmetric, no curvature. ROM normal. No CVA tenderness. ?Cardio: regular rate and rhythm, S1, S2 normal, no murmur, click, rub or gallop ?GI: soft, non-tender; bowel sounds normal; no masses,  no organomegaly ?Extremities: extremities normal, atraumatic, no cyanosis or edema  ? ?ECOG PERFORMANCE STATUS: 1 - Symptomatic but completely ambulatory ? ?Blood pressure 123/74, pulse 73, temperature (!) 95.8 ?F (35.4 ?C), temperature source Tympanic, resp. rate 17, height 5\' 2"  (1.575 m), weight 139 lb (63 kg), SpO2 96 %. ? ?LABORATORY DATA: ?Lab Results  ?Component Value Date  ? WBC 7.5 12/25/2021  ? HGB 11.8 (L) 12/25/2021  ? HCT 35.1 (L) 12/25/2021  ? MCV 93.9 12/25/2021  ? PLT 241 12/25/2021  ? ? ?  Chemistry   ?   ?Component Value Date/Time  ? NA 134 (L) 12/25/2021 1344  ? K 4.0 12/25/2021 1344  ? CL 100 12/25/2021  1344  ? CO2 27 12/25/2021 1344  ? BUN 18 12/25/2021 1344  ? CREATININE 0.71 12/25/2021 1344  ?    ?Component Value Date/Time  ? CALCIUM 9.1 12/25/2021 1344  ? ALKPHOS 76 12/25/2021 1344  ? AST 20 12/25/2021 1344  ? ALT 13 12/25/2021 1344  ? BILITOT 0.4 12/25/2021 1344  ?  ? ? ? ?RADIOGRAPHIC STUDIES: ?No results found. ? ? ?ASSESSMENT AND PLAN: This is a very pleasant 81 years old white female with extensive stage small cell lung cancer and she is currently undergoing treatment with carboplatin, etoposide and Imfinzi status post 5 cycles.  Starting from cycle #6 the patient is on maintenance treatment with single agent Imfinzi every 4 weeks status post 38 cycles of total treatment. ?The patient has been tolerating this treatment well with no concerning adverse effects. ?I recommended for her to proceed with cycle #39 today as planned. ?For the peripheral neuropathy, she will continue her current treatment with gabapentin to 200 mg p.o. twice daily. ?I will see her back for follow-up visit in 4 weeks for evaluation before the next cycle of  her treatment. ?She was advised to call immediately if she has any concerning symptoms in the interval. ?The patient voices understanding of current disease status and treatment options and is in agreement with

## 2022-01-22 NOTE — Patient Instructions (Signed)
Steps to Quit Smoking Smoking tobacco is the leading cause of preventable death. It can affect almost every organ in the body. Smoking puts you and people around you at risk for many serious, long-lasting (chronic) diseases. Quitting smoking can be hard, but it is one of the best things that you can do for your health. It is never too late to quit. Do not give up if you cannot quit the first time. Some people need to try many times to quit. Do your best to stick to your quit plan, and talk with your doctor if you have any questions or concerns. How do I get ready to quit? Pick a date to quit. Set a date within the next 2 weeks to give you time to prepare. Write down the reasons why you are quitting. Keep this list in places where you will see it often. Tell your family, friends, and co-workers that you are quitting. Their support is important. Talk with your doctor about the choices that may help you quit. Find out if your health insurance will pay for these treatments. Know the people, places, things, and activities that make you want to smoke (triggers). Avoid them. What first steps can I take to quit smoking? Throw away all cigarettes at home, at work, and in your car. Throw away the things that you use when you smoke, such as ashtrays and lighters. Clean your car. Empty the ashtray. Clean your home, including curtains and carpets. What can I do to help me quit smoking? Talk with your doctor about taking medicines and seeing a counselor. You are more likely to succeed when you do both. If you are pregnant or breastfeeding: Talk with your doctor about counseling or other ways to quit smoking. Do not take medicine to help you quit smoking unless your doctor tells you to. Quit right away Quit smoking completely, instead of slowly cutting back on how much you smoke over a period of time. Stopping smoking right away may be more successful than slowly quitting. Go to counseling. In-person is best  if this is an option. You are more likely to quit if you go to counseling sessions regularly. Take medicine You may take medicines to help you quit. Some medicines need a prescription, and some you can buy over-the-counter. Some medicines may contain a drug called nicotine to replace the nicotine in cigarettes. Medicines may: Help you stop having the desire to smoke (cravings). Help to stop the problems that come when you stop smoking (withdrawal symptoms). Your doctor may ask you to use: Nicotine patches, gum, or lozenges. Nicotine inhalers or sprays. Non-nicotine medicine that you take by mouth. Find resources Find resources and other ways to help you quit smoking and remain smoke-free after you quit. They include: Online chats with a counselor. Phone quitlines. Printed self-help materials. Support groups or group counseling. Text messaging programs. Mobile phone apps. Use apps on your mobile phone or tablet that can help you stick to your quit plan. Examples of free services include Quit Guide from the CDC and smokefree.gov  What can I do to make it easier to quit?  Talk to your family and friends. Ask them to support and encourage you. Call a phone quitline, such as 1-800-QUIT-NOW, reach out to support groups, or work with a counselor. Ask people who smoke to not smoke around you. Avoid places that make you want to smoke, such as: Bars. Parties. Smoke-break areas at work. Spend time with people who do not smoke. Lower   the stress in your life. Stress can make you want to smoke. Try these things to lower stress: Getting regular exercise. Doing deep-breathing exercises. Doing yoga. Meditating. What benefits will I see if I quit smoking? Over time, you may have: A better sense of smell and taste. Less coughing and sore throat. A slower heart rate. Lower blood pressure. Clearer skin. Better breathing. Fewer sick days. Summary Quitting smoking can be hard, but it is one of  the best things that you can do for your health. Do not give up if you cannot quit the first time. Some people need to try many times to quit. When you decide to quit smoking, make a plan to help you succeed. Quit smoking right away, not slowly over a period of time. When you start quitting, get help and support to keep you smoke-free. This information is not intended to replace advice given to you by your health care provider. Make sure you discuss any questions you have with your health care provider. Document Revised: 09/15/2021 Document Reviewed: 09/15/2021 Elsevier Patient Education  2023 Elsevier Inc.  

## 2022-01-22 NOTE — Patient Instructions (Signed)
[facility]  Discharge Instructions: Thank you for choosing [facility] to provide your oncology and hematology care.   If you have a lab appointment with the Cancer Center, please go directly to the Cancer Center and check in at the registration area.   Wear comfortable clothing and clothing appropriate for easy access to any Portacath or PICC line.   We strive to give you quality time with your provider. You may need to reschedule your appointment if you arrive late (15 or more minutes).  Arriving late affects you and other patients whose appointments are after yours.  Also, if you miss three or more appointments without notifying the office, you may be dismissed from the clinic at the provider's discretion.      For prescription refill requests, have your pharmacy contact our office and allow 72 hours for refills to be completed.    Today you received the following chemotherapy and/or immunotherapy agents Imfinzi      To help prevent nausea and vomiting after your treatment, we encourage you to take your nausea medication as directed.  BELOW ARE SYMPTOMS THAT SHOULD BE REPORTED IMMEDIATELY: *FEVER GREATER THAN 100.4 F (38 C) OR HIGHER *CHILLS OR SWEATING *NAUSEA AND VOMITING THAT IS NOT CONTROLLED WITH YOUR NAUSEA MEDICATION *UNUSUAL SHORTNESS OF BREATH *UNUSUAL BRUISING OR BLEEDING *URINARY PROBLEMS (pain or burning when urinating, or frequent urination) *BOWEL PROBLEMS (unusual diarrhea, constipation, pain near the anus) TENDERNESS IN MOUTH AND THROAT WITH OR WITHOUT PRESENCE OF ULCERS (sore throat, sores in mouth, or a toothache) UNUSUAL RASH, SWELLING OR PAIN  UNUSUAL VAGINAL DISCHARGE OR ITCHING   Items with * indicate a potential emergency and should be followed up as soon as possible or go to the Emergency Department if any problems should occur.  Please show the CHEMOTHERAPY ALERT CARD or IMMUNOTHERAPY ALERT CARD at check-in to the  Emergency Department and triage nurse.  Should you have questions after your visit or need to cancel or reschedule your appointment, please contact [facility]  Dept: [phone]  and follow the prompts.  Office hours are 8:00 a.m. to 4:30 p.m. Monday - Friday. Please note that voicemails left after 4:00 p.m. may not be returned until the following business day.  We are closed weekends and major holidays. You have access to a nurse at all times for urgent questions. Please call the main number to the clinic Dept: [phone] and follow the prompts.   For any non-urgent questions, you may also contact your provider using MyChart. We now offer e-Visits for anyone 18 and older to request care online for non-urgent symptoms. For details visit [email or web address].   Also download the MyChart app! Go to the app store, search "MyChart", open the app, select [facility], and log in with your MyChart username and password.  Due to Covid, a mask is required upon entering the hospital/clinic. If you do not have a mask, one will be given to you upon arrival. For doctor visits, patients may have 1 support person aged 18 or older with them. For treatment visits, patients cannot have anyone with them due to current Covid guidelines and our immunocompromised population.   

## 2022-02-02 ENCOUNTER — Telehealth: Payer: Self-pay | Admitting: Internal Medicine

## 2022-02-02 NOTE — Telephone Encounter (Signed)
Called patient regarding upcoming May and June appointments, patient is notified. ?

## 2022-02-15 NOTE — Progress Notes (Addendum)
[facility] ?OFFICE PROGRESS NOTE ? ?Pcp, No ?No address on file ? 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  ? ?PRIOR THERAPY: 1) Palliative radiotherapy to the left proximal humerus under the care of Dr. Lisbeth Renshaw. ?2) Additional palliative radiotherapy to the proximal humerus under the care of Dr. Lisbeth Renshaw. Last treatment on 04/21/2020 ? ?CURRENT THERAPY:  Palliative systemic chemotherapy with carboplatin for AUC of 5 on day 1, etoposide 100 mg/M2 on days 1, 2 and 3 as well as Imfinzi 1500 mg every 3 weeks with the chemotherapy and Neulasta support. First dose on 03/16/2019. Status post 39 cycles. Starting from cycle #5 the patient will be treated with maintenance treatment with Imfinzi 1500 mg IV every 4 weeks. ? ?INTERVAL HISTORY: ?Brittany Marks 81 y.o. female returns to the clinic today for a follow-up visit.  The patient is feeling fairly well today without any concerning complaints. She reports her baseline fatigue. The patient denies any recent fever, chills, or night sweats.  Her weight is stable.  She denies any chest pain or hemoptysis.  She reports her baseline dyspnea on minimal exertion. She reports her mild occasional chronic cough which comes and goes. She denies any chest pain or hemoptysis.  She denies any nausea, vomiting, diarrhea, or constipation. She is still pretty traumatized from having significant diarrhea following drinking the oral contrast from her last scan. Therefore, she refuses to drink oral contrast moving forward, which I discussed with her because I am going to order her next restaging scan today. We are monitoring a slightly enlarging lymph node closely on imaging. If this continues to enlarge, we will likely refer  to radiation. She denies any headache or visual changes. She denies any rashes or skin changes except for dry flaky skin. She still has limited mobility in her left shoulder where she previously had radiation and surgery on the metastatic bone lesion in 2020. She is here today for evaluation and repeat blood work before starting cycle #40 ? ?MEDICAL HISTORY: ?Past Medical History:  ?Diagnosis Date  ? Cataract   ? Bilateral  ? Constipation   ? pain medication  ? Left rotator cuff tear   ? Right rotator cuff tear   ? SCL CA dx'd 11/2018  ? ? ?ALLERGIES:  has No Known Allergies. ? ?MEDICATIONS:  ?Current Outpatient Medications  ?Medication Sig Dispense Refill  ? Ascorbic Acid (VITAMIN C PO) Take by mouth daily.     ? b complex vitamins tablet Take 1 tablet by mouth daily.    ? COD LIVER OIL PO Take 1 tablet by mouth daily.     ? cyclobenzaprine (FLEXERIL) 10 MG tablet Take 1 tablet (10 mg total) by mouth 3 (three) times daily as needed for muscle spasms. 30 tablet 1  ? gabapentin (NEURONTIN) 100 MG capsule Take 2 capsules (200 mg total) by mouth 2 (two) times daily. 90 capsule 2  ? HYDROcodone-acetaminophen (NORCO) 5-325 MG tablet Take 1 tablet by mouth every 6 (six) hours as needed for moderate pain. 20 tablet 0  ? hydrocortisone 2.5 % cream Apply topically as needed. (Patient taking differently: Apply 1 application topically daily as needed (Treatment).) 3.5 g 0  ? ibuprofen (ADVIL) 200 MG tablet Take 600 mg by mouth daily.    ?  lidocaine-prilocaine (EMLA) cream Apply 1 application topically every 30 (thirty) days.     ? Multiple Vitamin (MULTIVITAMIN) tablet Take 1 tablet by mouth daily.    ? ondansetron (ZOFRAN) 4 MG tablet Take 1 tablet (4 mg total) by mouth every 8 (eight) hours as needed for nausea or vomiting. 10 tablet 0  ? oxymetazoline (AFRIN) 0.05 % nasal spray Place 2 sprays into the nose 2 (two) times daily as needed (Bloody nose).     ? prochlorperazine (COMPAZINE) 10 MG tablet Take 1 tablet (10 mg total)  by mouth every 6 (six) hours as needed for nausea or vomiting. 30 tablet 0  ? silver sulfADIAZINE (SILVADENE) 1 % cream Apply 1 application topically 2 (two) times daily. 50 g 1  ? sodium chloride (OCEAN) 0.65 % nasal spray Place 1 spray into the nose at bedtime.    ? VITAMIN E PO Take 1 tablet by mouth daily.     ? ?No current facility-administered medications for this visit.  ? ?Facility-Administered Medications Ordered in Other Visits  ?Medication Dose Route Frequency Provider Last Rate Last Admin  ? 0.9 %  sodium chloride infusion   Intravenous Once Curt Bears, MD      ? durvalumab [facility]) 1,500 mg in sodium chloride 0.9 % 100 mL chemo infusion  1,500 mg Intravenous Once Curt Bears, MD      ? heparin lock flush 100 unit/mL  500 Units Intracatheter Once PRN Magrinat, Virgie Dad, MD      ? heparin lock flush 100 unit/mL  500 Units Intracatheter Once PRN Curt Bears, MD      ? sodium chloride flush (NS) 0.9 % injection 10 mL  10 mL Intracatheter PRN Magrinat, Virgie Dad, MD      ? sodium chloride flush (NS) 0.9 % injection 10 mL  10 mL Intracatheter PRN Curt Bears, MD      ? ? ?SURGICAL HISTORY:  ?Past Surgical History:  ?Procedure Laterality Date  ? CATARACT EXTRACTION W/ INTRAOCULAR LENS  IMPLANT, BILATERAL  2012  ? DECOMPRESSIVE LUMBAR LAMINECTOMY LEVEL 2 N/A 07/29/2013  ? Procedure: LUMBAR LAMINECTOMY, DECOMPRESSION LUMBAR THREE TO FOUR, FOUR TO FIVE microdiscectomy l3,4 right;  Surgeon: Tobi Bastos, MD;  Location: [facility];  Service: Orthopedics;  Laterality: N/A;  ? I & D SUPERIOR RIGHT SHOULDER AND CLOSURE WOUND  01-10-2011  ? S/P ROTATOR CUFF REPAIR  ? IR IMAGING GUIDED PORT INSERTION  04/09/2019  ? LUMBAR LAMINECTOMY  1970'S  ? LUMBAR LAMINECTOMY/DECOMPRESSION MICRODISCECTOMY N/A 06/27/2016  ? Procedure: L1 - L2 DISCECTOMY;  Surgeon: Melina Schools, MD;  Location: [place];  Service: Orthopedics;  Laterality: N/A;  ? [place]  ? REVERSE SHOULDER ARTHROPLASTY Left 03/10/2020   ? Procedure: REVERSE SHOULDER ARTHROPLASTY;  Surgeon: Justice Britain, MD;  Location: [facility];  Service: Orthopedics;  Laterality: Left;  140min  ? RIGHT SHOULDER ARTHROSCOPY/ OPEN DISTAL CLAVICLE RESECTION/ SAD/ OPEN ROTATOR CUFF REPAIR  11-28-2010  ? SHOULDER FUSION SURGERY  03/11/2020  ? Shoulder Surgery , hardware placed  ? SHOULDER OPEN ROTATOR CUFF REPAIR  12/19/2011  ? Procedure: ROTATOR CUFF REPAIR SHOULDER OPEN;  Surgeon: Magnus Sinning, MD;  Location: [place];  Service: Orthopedics;  Laterality: Right;  RIGHT RECURRENT OPEN REPAIR OF THE ROTATOR CUFF WITH TISSUE MEND GRAFTANTERIOR CHROMIOECTOMY  ? VAGINAL HYSTERECTOMY  1979  ? ? ?REVIEW OF SYSTEMS:   ?Constitutional: Positive for fatigue.  Negative for appetite change, chills, fever and unexpected weight change.  ?HENT:  Negative for mouth sores, nosebleeds, sore throat and trouble swallowing.   ?Eyes: Negative for eye problems and icterus.  ?Respiratory: Positive for stable dyspnea on exertion and mild intermittent chronic cough.  Negative for hemoptysis and wheezing.   ?Cardiovascular: Negative for chest pain and leg swelling.  ?Gastrointestinal: Negative for abdominal pain, recent diarrhea, constipation, diarrhea, nausea and vomiting.  ?Genitourinary: Negative for bladder incontinence, difficulty urinating, dysuria, frequency and hematuria.   ?Musculoskeletal: Negative for back pain, gait problem, neck pain and neck stiffness.  ?Skin: Negative for itching and rash.  ?Neurological: Positive for peripheral neuropathy. Negative for dizziness, extremity weakness, gait problem, headaches, light-headedness and seizures.  ?Hematological: Negative for adenopathy. Does not bruise/bleed easily.  ?Psychiatric/Behavioral: Negative for confusion, depression and sleep disturbance. The patient is not nervous/anxious.    ? ? ?PHYSICAL EXAMINATION:  ?Blood pressure 130/69, pulse 72, temperature (!) 97.5 ?F (36.4 ?C), temperature source Tympanic, resp.  rate 16, height 5\' 2"  (1.575 m), weight 139 lb 12.8 oz (63.4 kg), SpO2 95 %. ? ?ECOG PERFORMANCE STATUS: 2 ? ?Physical Exam  ?Constitutional: Oriented to person, place, and time and well-developed,

## 2022-02-19 ENCOUNTER — Inpatient Hospital Stay (HOSPITAL_BASED_OUTPATIENT_CLINIC_OR_DEPARTMENT_OTHER): Payer: Federal, State, Local not specified - PPO | Admitting: Physician Assistant

## 2022-02-19 ENCOUNTER — Other Ambulatory Visit: Payer: Federal, State, Local not specified - PPO

## 2022-02-19 ENCOUNTER — Ambulatory Visit: Payer: Federal, State, Local not specified - PPO

## 2022-02-19 ENCOUNTER — Other Ambulatory Visit: Payer: Self-pay

## 2022-02-19 ENCOUNTER — Inpatient Hospital Stay: Payer: Federal, State, Local not specified - PPO

## 2022-02-19 ENCOUNTER — Ambulatory Visit: Payer: Federal, State, Local not specified - PPO | Admitting: Internal Medicine

## 2022-02-19 ENCOUNTER — Inpatient Hospital Stay: Payer: Federal, State, Local not specified - PPO | Attending: Oncology

## 2022-02-19 VITALS — BP 130/69 | HR 72 | Temp 97.5°F | Resp 16 | Ht 62.0 in | Wt 139.8 lb

## 2022-02-19 DIAGNOSIS — Z5112 Encounter for antineoplastic immunotherapy: Secondary | ICD-10-CM | POA: Diagnosis not present

## 2022-02-19 DIAGNOSIS — Z79899 Other long term (current) drug therapy: Secondary | ICD-10-CM | POA: Insufficient documentation

## 2022-02-19 DIAGNOSIS — M84422A Pathological fracture, left humerus, initial encounter for fracture: Secondary | ICD-10-CM

## 2022-02-19 DIAGNOSIS — R197 Diarrhea, unspecified: Secondary | ICD-10-CM | POA: Diagnosis not present

## 2022-02-19 DIAGNOSIS — C3411 Malignant neoplasm of upper lobe, right bronchus or lung: Secondary | ICD-10-CM

## 2022-02-19 DIAGNOSIS — Z95828 Presence of other vascular implants and grafts: Secondary | ICD-10-CM

## 2022-02-19 DIAGNOSIS — C7951 Secondary malignant neoplasm of bone: Secondary | ICD-10-CM | POA: Diagnosis not present

## 2022-02-19 LAB — CBC WITH DIFFERENTIAL (CANCER CENTER ONLY)
Abs Immature Granulocytes: 0.02 10*3/uL (ref 0.00–0.07)
Basophils Absolute: 0 10*3/uL (ref 0.0–0.1)
Basophils Relative: 0 %
Eosinophils Absolute: 0.3 10*3/uL (ref 0.0–0.5)
Eosinophils Relative: 3 %
HCT: 34.2 % — ABNORMAL LOW (ref 36.0–46.0)
Hemoglobin: 11.9 g/dL — ABNORMAL LOW (ref 12.0–15.0)
Immature Granulocytes: 0 %
Lymphocytes Relative: 17 %
Lymphs Abs: 1.6 10*3/uL (ref 0.7–4.0)
MCH: 32.3 pg (ref 26.0–34.0)
MCHC: 34.8 g/dL (ref 30.0–36.0)
MCV: 92.9 fL (ref 80.0–100.0)
Monocytes Absolute: 0.7 10*3/uL (ref 0.1–1.0)
Monocytes Relative: 8 %
Neutro Abs: 6.7 10*3/uL (ref 1.7–7.7)
Neutrophils Relative %: 72 %
Platelet Count: 258 10*3/uL (ref 150–400)
RBC: 3.68 MIL/uL — ABNORMAL LOW (ref 3.87–5.11)
RDW: 13.2 % (ref 11.5–15.5)
WBC Count: 9.4 10*3/uL (ref 4.0–10.5)
nRBC: 0 % (ref 0.0–0.2)

## 2022-02-19 LAB — CMP (CANCER CENTER ONLY)
ALT: 12 U/L (ref 0–44)
AST: 20 U/L (ref 15–41)
Albumin: 3.7 g/dL (ref 3.5–5.0)
Alkaline Phosphatase: 69 U/L (ref 38–126)
Anion gap: 5 (ref 5–15)
BUN: 17 mg/dL (ref 8–23)
CO2: 27 mmol/L (ref 22–32)
Calcium: 8.8 mg/dL — ABNORMAL LOW (ref 8.9–10.3)
Chloride: 104 mmol/L (ref 98–111)
Creatinine: 0.7 mg/dL (ref 0.44–1.00)
GFR, Estimated: >60 mL/min (ref >60)
Glucose, Bld: 112 mg/dL — ABNORMAL HIGH (ref 70–99)
Potassium: 3.8 mmol/L (ref 3.5–5.1)
Sodium: 136 mmol/L (ref 135–145)
Total Bilirubin: 0.5 mg/dL (ref 0.3–1.2)
Total Protein: 7.1 g/dL (ref 6.5–8.1)

## 2022-02-19 LAB — TSH: TSH: 2.795 u[IU]/mL (ref 0.350–4.500)

## 2022-02-19 MED ORDER — SODIUM CHLORIDE 0.9 % IV SOLN
1500.0000 mg | Freq: Once | INTRAVENOUS | Status: AC
  Administered 2022-02-19: 1500 mg via INTRAVENOUS
  Filled 2022-02-19: qty 30

## 2022-02-19 MED ORDER — SODIUM CHLORIDE 0.9 % IV SOLN: Freq: Once | INTRAVENOUS | Status: AC

## 2022-02-19 MED ORDER — SODIUM CHLORIDE 0.9% FLUSH
10.0000 mL | INTRAVENOUS | Status: DC | PRN
  Administered 2022-02-19: 10 mL

## 2022-02-19 MED ORDER — HEPARIN SOD (PORK) LOCK FLUSH 100 UNIT/ML IV SOLN
500.0000 [IU] | Freq: Once | INTRAVENOUS | Status: AC | PRN
  Administered 2022-02-19: 500 [IU]

## 2022-02-19 NOTE — Patient Instructions (Signed)
[facility]  Discharge Instructions: Thank you for choosing [facility] to provide your oncology and hematology care.   If you have a lab appointment with the Cancer Center, please go directly to the Cancer Center and check in at the registration area.   Wear comfortable clothing and clothing appropriate for easy access to any Portacath or PICC line.   We strive to give you quality time with your provider. You may need to reschedule your appointment if you arrive late (15 or more minutes).  Arriving late affects you and other patients whose appointments are after yours.  Also, if you miss three or more appointments without notifying the office, you may be dismissed from the clinic at the provider's discretion.      For prescription refill requests, have your pharmacy contact our office and allow 72 hours for refills to be completed.    Today you received the following chemotherapy and/or immunotherapy agents imfinzi       To help prevent nausea and vomiting after your treatment, we encourage you to take your nausea medication as directed.  BELOW ARE SYMPTOMS THAT SHOULD BE REPORTED IMMEDIATELY: *FEVER GREATER THAN 100.4 F (38 C) OR HIGHER *CHILLS OR SWEATING *NAUSEA AND VOMITING THAT IS NOT CONTROLLED WITH YOUR NAUSEA MEDICATION *UNUSUAL SHORTNESS OF BREATH *UNUSUAL BRUISING OR BLEEDING *URINARY PROBLEMS (pain or burning when urinating, or frequent urination) *BOWEL PROBLEMS (unusual diarrhea, constipation, pain near the anus) TENDERNESS IN MOUTH AND THROAT WITH OR WITHOUT PRESENCE OF ULCERS (sore throat, sores in mouth, or a toothache) UNUSUAL RASH, SWELLING OR PAIN  UNUSUAL VAGINAL DISCHARGE OR ITCHING   Items with * indicate a potential emergency and should be followed up as soon as possible or go to the Emergency Department if any problems should occur.  Please show the CHEMOTHERAPY ALERT CARD or IMMUNOTHERAPY ALERT CARD at check-in to  the Emergency Department and triage nurse.  Should you have questions after your visit or need to cancel or reschedule your appointment, please contact [facility]  Dept: [phone]  and follow the prompts.  Office hours are 8:00 a.m. to 4:30 p.m. Monday - Friday. Please note that voicemails left after 4:00 p.m. may not be returned until the following business day.  We are closed weekends and major holidays. You have access to a nurse at all times for urgent questions. Please call the main number to the clinic Dept: [phone] and follow the prompts.   For any non-urgent questions, you may also contact your provider using MyChart. We now offer e-Visits for anyone 18 and older to request care online for non-urgent symptoms. For details visit [email or web address].   Also download the MyChart app! Go to the app store, search "MyChart", open the app, select [facility], and log in with your MyChart username and password.  Due to Covid, a mask is required upon entering the hospital/clinic. If you do not have a mask, one will be given to you upon arrival. For doctor visits, patients may have 1 support person aged 18 or older with them. For treatment visits, patients cannot have anyone with them due to current Covid guidelines and our immunocompromised population.   

## 2022-02-19 NOTE — Addendum Note (Signed)
Addended by: Gracelyn Nurse on: 02/19/2022 01:40 PM ? ? Modules accepted: Orders ? ?

## 2022-03-15 ENCOUNTER — Ambulatory Visit (HOSPITAL_COMMUNITY)
Admission: RE | Admit: 2022-03-15 | Discharge: 2022-03-15 | Disposition: A | Discharge status: Home / Self Care | Payer: Federal, State, Local not specified - PPO | Source: Ambulatory Visit | Attending: Physician Assistant | Admitting: Physician Assistant

## 2022-03-15 DIAGNOSIS — C3411 Malignant neoplasm of upper lobe, right bronchus or lung: Secondary | ICD-10-CM | POA: Diagnosis not present

## 2022-03-15 DIAGNOSIS — M84422A Pathological fracture, left humerus, initial encounter for fracture: Secondary | ICD-10-CM | POA: Diagnosis present

## 2022-03-15 DIAGNOSIS — Z5112 Encounter for antineoplastic immunotherapy: Secondary | ICD-10-CM | POA: Insufficient documentation

## 2022-03-15 MED ORDER — HEPARIN SOD (PORK) LOCK FLUSH 100 UNIT/ML IV SOLN
INTRAVENOUS | Status: AC
  Administered 2022-03-15: 500 [IU] via INTRAVENOUS
  Filled 2022-03-15: qty 5

## 2022-03-15 MED ORDER — HEPARIN SOD (PORK) LOCK FLUSH 100 UNIT/ML IV SOLN: 500.0000 [IU] | Freq: Once | INTRAVENOUS | Status: AC

## 2022-03-15 MED ORDER — SODIUM CHLORIDE (PF) 0.9 % IJ SOLN
INTRAMUSCULAR | Status: AC
  Filled 2022-03-15: qty 50

## 2022-03-15 MED ORDER — IOHEXOL 300 MG/ML  SOLN
100.0000 mL | Freq: Once | INTRAMUSCULAR | Status: AC | PRN
  Administered 2022-03-15: 100 mL via INTRAVENOUS

## 2022-03-19 ENCOUNTER — Other Ambulatory Visit: Payer: Federal, State, Local not specified - PPO

## 2022-03-19 ENCOUNTER — Ambulatory Visit: Payer: Federal, State, Local not specified - PPO | Admitting: Physician Assistant

## 2022-03-19 ENCOUNTER — Ambulatory Visit: Payer: Federal, State, Local not specified - PPO | Admitting: Internal Medicine

## 2022-03-19 ENCOUNTER — Ambulatory Visit: Payer: Federal, State, Local not specified - PPO

## 2022-03-21 ENCOUNTER — Other Ambulatory Visit: Payer: Federal, State, Local not specified - PPO

## 2022-03-21 ENCOUNTER — Ambulatory Visit: Payer: Federal, State, Local not specified - PPO | Admitting: Physician Assistant

## 2022-03-21 ENCOUNTER — Ambulatory Visit: Payer: Federal, State, Local not specified - PPO

## 2022-03-22 ENCOUNTER — Inpatient Hospital Stay: Payer: Federal, State, Local not specified - PPO | Attending: Oncology

## 2022-03-22 ENCOUNTER — Encounter: Payer: Self-pay | Admitting: Internal Medicine

## 2022-03-22 ENCOUNTER — Inpatient Hospital Stay: Payer: Federal, State, Local not specified - PPO

## 2022-03-22 ENCOUNTER — Other Ambulatory Visit: Payer: Self-pay

## 2022-03-22 ENCOUNTER — Inpatient Hospital Stay (HOSPITAL_BASED_OUTPATIENT_CLINIC_OR_DEPARTMENT_OTHER): Payer: Federal, State, Local not specified - PPO | Admitting: Internal Medicine

## 2022-03-22 VITALS — BP 111/59 | HR 72 | Temp 97.5°F | Resp 18 | Wt 141.1 lb

## 2022-03-22 DIAGNOSIS — Z5112 Encounter for antineoplastic immunotherapy: Secondary | ICD-10-CM

## 2022-03-22 DIAGNOSIS — C3411 Malignant neoplasm of upper lobe, right bronchus or lung: Secondary | ICD-10-CM

## 2022-03-22 DIAGNOSIS — C7951 Secondary malignant neoplasm of bone: Secondary | ICD-10-CM | POA: Insufficient documentation

## 2022-03-22 DIAGNOSIS — G629 Polyneuropathy, unspecified: Secondary | ICD-10-CM | POA: Diagnosis not present

## 2022-03-22 DIAGNOSIS — Z79899 Other long term (current) drug therapy: Secondary | ICD-10-CM | POA: Insufficient documentation

## 2022-03-22 DIAGNOSIS — Z95828 Presence of other vascular implants and grafts: Secondary | ICD-10-CM

## 2022-03-22 LAB — CMP (CANCER CENTER ONLY)
ALT: 13 U/L (ref 0–44)
AST: 20 U/L (ref 15–41)
Albumin: 3.7 g/dL (ref 3.5–5.0)
Alkaline Phosphatase: 78 U/L (ref 38–126)
Anion gap: 5 (ref 5–15)
BUN: 18 mg/dL (ref 8–23)
CO2: 27 mmol/L (ref 22–32)
Calcium: 9.2 mg/dL (ref 8.9–10.3)
Chloride: 105 mmol/L (ref 98–111)
Creatinine: 0.73 mg/dL (ref 0.44–1.00)
GFR, Estimated: >60 mL/min (ref >60)
Glucose, Bld: 155 mg/dL — ABNORMAL HIGH (ref 70–99)
Potassium: 3.9 mmol/L (ref 3.5–5.1)
Sodium: 137 mmol/L (ref 135–145)
Total Bilirubin: 0.4 mg/dL (ref 0.3–1.2)
Total Protein: 7.2 g/dL (ref 6.5–8.1)

## 2022-03-22 LAB — CBC WITH DIFFERENTIAL (CANCER CENTER ONLY)
Abs Immature Granulocytes: 0.02 10*3/uL (ref 0.00–0.07)
Basophils Absolute: 0.1 10*3/uL (ref 0.0–0.1)
Basophils Relative: 1 %
Eosinophils Absolute: 0.4 10*3/uL (ref 0.0–0.5)
Eosinophils Relative: 5 %
HCT: 36.4 % (ref 36.0–46.0)
Hemoglobin: 12.4 g/dL (ref 12.0–15.0)
Immature Granulocytes: 0 %
Lymphocytes Relative: 18 %
Lymphs Abs: 1.5 10*3/uL (ref 0.7–4.0)
MCH: 32.1 pg (ref 26.0–34.0)
MCHC: 34.1 g/dL (ref 30.0–36.0)
MCV: 94.3 fL (ref 80.0–100.0)
Monocytes Absolute: 0.7 10*3/uL (ref 0.1–1.0)
Monocytes Relative: 8 %
Neutro Abs: 5.8 10*3/uL (ref 1.7–7.7)
Neutrophils Relative %: 68 %
Platelet Count: 250 10*3/uL (ref 150–400)
RBC: 3.86 MIL/uL — ABNORMAL LOW (ref 3.87–5.11)
RDW: 13.4 % (ref 11.5–15.5)
WBC Count: 8.5 10*3/uL (ref 4.0–10.5)
nRBC: 0 % (ref 0.0–0.2)

## 2022-03-22 LAB — TSH: TSH: 2.976 u[IU]/mL (ref 0.350–4.500)

## 2022-03-22 MED ORDER — SODIUM CHLORIDE 0.9 % IV SOLN
1500.0000 mg | Freq: Once | INTRAVENOUS | Status: AC
  Administered 2022-03-22: 1500 mg via INTRAVENOUS
  Filled 2022-03-22: qty 30

## 2022-03-22 MED ORDER — SODIUM CHLORIDE 0.9% FLUSH
10.0000 mL | INTRAVENOUS | Status: DC | PRN
  Administered 2022-03-22: 10 mL

## 2022-03-22 MED ORDER — SODIUM CHLORIDE 0.9 % IV SOLN: Freq: Once | INTRAVENOUS | Status: AC

## 2022-03-22 MED ORDER — HEPARIN SOD (PORK) LOCK FLUSH 100 UNIT/ML IV SOLN
500.0000 [IU] | Freq: Once | INTRAVENOUS | Status: AC | PRN
  Administered 2022-03-22: 500 [IU]

## 2022-03-22 NOTE — Progress Notes (Signed)
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   PRIOR THERAPY: None   CURRENT THERAPY: Palliative systemic chemotherapy with carboplatin for AUC of 5 on day 1, etoposide 100 mg/M2 on days 1, 2 and 3 as well as Imfinzi 1500 mg every 3 weeks with the chemotherapy and Neulasta support. First dose on 03/16/2019. Status post 40  cycles.   Starting from cycle #5 the patient will be treated with maintenance treatment with Imfinzi 1500 mg IV every 4 weeks.  INTERVAL HISTORY: Brittany Marks 81 y.o. female returns to the clinic today for follow-up visit.  The patient has no complaints today except for the intermittent pain on the left arm and shoulder area.  She has no current chest pain but has shortness of breath with exertion with no cough or hemoptysis.  She has no nausea, vomiting, diarrhea or constipation.  She has no weight loss or night sweats.  She has no headache or visual changes.  She continues to tolerate her treatment with Imfinzi fairly well.  The patient is here today for evaluation with repeat CT scan of the chest, abdomen pelvis as well as CT scan of the left arm for restaging of her disease.  MEDICAL HISTORY: Past Medical History:  Diagnosis Date   Cataract    Bilateral   Constipation    pain medication   Left rotator cuff tear    Right rotator cuff tear    SCL CA dx'd 11/2018    ALLERGIES:  has No Known Allergies.  MEDICATIONS:  Current Outpatient Medications  Medication Sig Dispense Refill   Ascorbic Acid (VITAMIN C PO) Take by mouth daily.      b complex vitamins tablet Take 1 tablet by mouth  daily.     COD LIVER OIL PO Take 1 tablet by mouth daily.      cyclobenzaprine (FLEXERIL) 10 MG tablet Take 1 tablet (10 mg total) by mouth 3 (three) times daily as needed for muscle spasms. 30 tablet 1   gabapentin (NEURONTIN) 100 MG capsule Take 2 capsules (200 mg total) by mouth 2 (two) times daily. 90 capsule 2   HYDROcodone-acetaminophen (NORCO) 5-325 MG tablet Take 1 tablet by mouth every 6 (six) hours as needed for moderate pain. 20 tablet 0   hydrocortisone 2.5 % cream Apply topically as needed. (Patient taking differently: Apply 1 application topically daily as needed (Treatment).) 3.5 g 0   ibuprofen (ADVIL) 200 MG tablet Take 600 mg by mouth daily.     lidocaine-prilocaine (EMLA) cream Apply 1 application topically every 30 (thirty) days.      Multiple Vitamin (MULTIVITAMIN) tablet Take 1 tablet by mouth daily.     ondansetron (ZOFRAN) 4 MG tablet Take 1 tablet (4 mg total) by mouth every 8 (eight) hours as needed for nausea or vomiting. 10 tablet 0   oxymetazoline (AFRIN) 0.05 % nasal spray Place 2 sprays into the nose 2 (two) times daily as needed (Bloody nose).      prochlorperazine (COMPAZINE) 10 MG tablet Take 1 tablet (10 mg total) by mouth every 6 (six) hours as needed for nausea or vomiting. [place]  tablet 0   silver sulfADIAZINE (SILVADENE) 1 % cream Apply 1 application topically 2 (two) times daily. 50 g 1   sodium chloride (OCEAN) 0.65 % nasal spray Place 1 spray into the nose at bedtime.     VITAMIN E PO Take 1 tablet by mouth daily.      No current facility-administered medications for this visit.   Facility-Administered Medications Ordered in Other Visits  Medication Dose Route Frequency Provider Last Rate Last Admin   heparin lock flush 100 unit/mL  500 Units Intracatheter Once PRN Magrinat, Virgie Dad, MD       sodium chloride flush (NS) 0.9 % injection 10 mL  10 mL Intracatheter PRN Magrinat, Virgie Dad, MD       sodium chloride flush (NS) 0.9 % injection 10 mL  10 mL  Intracatheter PRN Curt Bears, MD   10 mL at 03/22/22 1319    SURGICAL HISTORY:  Past Surgical History:  Procedure Laterality Date   CATARACT EXTRACTION W/ INTRAOCULAR LENS  IMPLANT, BILATERAL  2012   DECOMPRESSIVE LUMBAR LAMINECTOMY LEVEL 2 N/A 07/29/2013   Procedure: LUMBAR LAMINECTOMY, DECOMPRESSION LUMBAR THREE TO FOUR, FOUR TO FIVE microdiscectomy l3,4 right;  Surgeon: Tobi Bastos, MD;  Location: [facility];  Service: Orthopedics;  Laterality: N/A;   I & D SUPERIOR RIGHT SHOULDER AND CLOSURE WOUND  01-10-2011   S/P ROTATOR CUFF REPAIR   IR IMAGING GUIDED PORT INSERTION  04/09/2019   LUMBAR LAMINECTOMY  1970'S   LUMBAR LAMINECTOMY/DECOMPRESSION MICRODISCECTOMY N/A 06/27/2016   Procedure: L1 - L2 DISCECTOMY;  Surgeon: Melina Schools, MD;  Location: [facility];  Service: Orthopedics;  Laterality: N/A;   LUMBAR SPINE SURGERY  1983   REVERSE SHOULDER ARTHROPLASTY Left 03/10/2020   Procedure: REVERSE SHOULDER ARTHROPLASTY;  Surgeon: Justice Britain, MD;  Location: [facility];  Service: Orthopedics;  Laterality: Left;  114min   RIGHT SHOULDER ARTHROSCOPY/ OPEN DISTAL CLAVICLE RESECTION/ SAD/ OPEN ROTATOR CUFF REPAIR  11-28-2010   SHOULDER FUSION SURGERY  03/11/2020   Shoulder Surgery , hardware placed   SHOULDER OPEN ROTATOR CUFF REPAIR  12/19/2011   Procedure: ROTATOR CUFF REPAIR SHOULDER OPEN;  Surgeon: Magnus Sinning, MD;  Location: [facility];  Service: Orthopedics;  Laterality: Right;  RIGHT RECURRENT OPEN REPAIR OF THE ROTATOR CUFF WITH TISSUE MEND [place]   VAGINAL HYSTERECTOMY  1979    REVIEW OF SYSTEMS:  Constitutional: positive for fatigue Eyes: negative Ears, nose, mouth, throat, and face: negative Respiratory: positive for dyspnea on exertion Cardiovascular: negative Gastrointestinal: negative Genitourinary:negative Integument/breast: negative Hematologic/lymphatic: negative Musculoskeletal:positive for bone pain Neurological:  negative Behavioral/Psych: negative Endocrine: negative Allergic/Immunologic: negative   PHYSICAL EXAMINATION: General appearance: alert, cooperative, fatigued, and no distress Head: Normocephalic, without obvious abnormality, atraumatic Neck: no adenopathy, no JVD, supple, symmetrical, trachea midline, and thyroid not enlarged, symmetric, no tenderness/mass/nodules Lymph nodes: Cervical, supraclavicular, and axillary nodes normal. Resp: clear to auscultation bilaterally Back: symmetric, no curvature. ROM normal. No CVA tenderness. Cardio: regular rate and rhythm, S1, S2 normal, no murmur, click, rub or gallop GI: soft, non-tender; bowel sounds normal; no masses,  no organomegaly Extremities: extremities normal, atraumatic, no cyanosis or edema Neurologic: Alert and oriented X 3, normal strength and tone. Normal symmetric reflexes. Normal coordination and gait   ECOG PERFORMANCE STATUS: 1 - Symptomatic but completely ambulatory  Blood pressure (!) 111/59, pulse 72, temperature (!) 97.5 F (36.4 C), temperature source Tympanic, resp. rate 18, weight 141 lb 2 oz (64 kg), SpO2 94 %.  LABORATORY  DATA: Lab Results  Component Value Date   WBC 8.5 03/22/2022   HGB 12.4 03/22/2022   HCT 36.4 03/22/2022   MCV 94.3 03/22/2022   PLT 250 03/22/2022      Chemistry      Component Value Date/Time   NA 136 02/19/2022 1125   K 3.8 02/19/2022 1125   CL 104 02/19/2022 1125   CO2 27 02/19/2022 1125   BUN 17 02/19/2022 1125   CREATININE 0.70 02/19/2022 1125      Component Value Date/Time   CALCIUM 8.8 (L) 02/19/2022 1125   ALKPHOS 69 02/19/2022 1125   AST 20 02/19/2022 1125   ALT 12 02/19/2022 1125   BILITOT 0.5 02/19/2022 1125       RADIOGRAPHIC STUDIES: CT HUMERUS LEFT W CONTRAST  Result Date: 03/17/2022 CLINICAL DATA:  Extensive stage small-cell carcinoma of the right upper lobe. Metastatic disease evaluation. Left humerus lesion. Continued left arm pain. Encounter for  antineoplastic immunotherapy. Assess for pathological fracture. Initial encounter. EXAM: CT OF THE UPPER LEFT EXTREMITY WITH CONTRAST TECHNIQUE: Multidetector CT imaging of the PET left humerus was performed according to the standard protocol following intravenous contrast administration. RADIATION DOSE REDUCTION: This exam was performed according to the departmental dose-optimization program which includes automated exposure control, adjustment of the mA and/or kV according to patient size and/or use of iterative reconstruction technique. CONTRAST:  181mL OMNIPAQUE IOHEXOL 300 MG/ML  SOLN COMPARISON:  PET/CT 02/24/2019; CT chest 12/22/2021 FINDINGS: Bones/Joint/Cartilage Postsurgical changes are again seen of reverse total left shoulder arthroplasty. There is associated metallic streak artifact. There is cement fixation distal to the humeral stem. Within these limitations, no perihardware lucency is seen to indicate hardware failure or loosening. Distal to the proximal left humerus arthroplasty hardware and cement fixation, there is diffuse decreased bone marrow density. No definite aggressive lytic or blastic lesion is identified. There is a displaced fracture of the distal 40% of the coracoid process with inferior angulation and overall up to approximately 9 mm inferior displacement (sagittal series 14, image 35). There is lucency within the displaced distal coracoid fracture component suspicious for a pathologic lesion. There is heterotopic bone formation that appears chronic posteroinferior to the proximal humeral native metadiaphysis. This is at the periphery aspect of a moderate glenohumeral joint effusion. Mild acromioclavicular joint space narrowing and peripheral osteophytosis. There is relative lucency within the lateral aspect of the clavicle at the acromioclavicular joint which may be degenerative, however cannot exclude lucent lesions of osseous metastases (sagittal series 14 images 35 through 39,  coronal series 13 images 32 through 39). Ligaments Suboptimally assessed by CT. Muscles and Tendons Mild low density and decreased definition within the anterior inferior glenoid musculature and pectoralis major muscle rupture (sagittal series 12, images 34-49, axial series 8 images 5 through 27). This may be postoperative however cannot exclude other causes of fluid within the muscle including interstitial tears or myositis. Soft tissues No axillary lymphadenopathy. There is soft tissue density and bronchiectasis within the partially visualized superolateral aspect of the left lung. This is nonspecific and may represent chronic scarring possibly from radiation therapy. There is high-grade centrilobular emphysematous change within the visualized portion of the left lung with diffuse interstitial thickening. Possible mild peripheral inferior lung honeycombing as seen on prior CT 12/22/2021. IMPRESSION: 1. Postsurgical changes of total left shoulder arthroplasty. Within the limitations of metallic streak artifact, no abnormal lucency is seen around the hardware to indicate hardware loosening or a pathologic fracture. 2. There is a likely pathologic fracture  of the distal aspect of the coracoid process with associated bone lucency and inferior displacement and inferior angulation of the distal coracoid process fracture component. 3. There is relative lucency within the lateral aspect of the clavicle at the acromioclavicular joint which may be degenerative, however cannot exclude lucent lesions of osseous metastases. Electronically Signed   By: Yvonne Kendall M.D.   On: 03/17/2022 22:29   CT Chest W Contrast  Result Date: 03/16/2022 CLINICAL DATA:  Primary Cancer Type: Lung Imaging Indication: Assess response to therapy Interval therapy since last imaging? Yes Initial Cancer Diagnosis Date: 03/03/2019; Established by: Biopsy-proven Detailed Pathology: Extensive stage small cell lung cancer. Primary Tumor location:  Right upper lobe. 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 Surgeries: No thoracic. Hysterectomy. Lumbar laminectomy. Left shoulder reverse arthoplasty 2021. Chemotherapy: Yes; Ongoing?  No; most recent administration: 2020 Immunotherapy?  Yes; Type: Imfinzi; Ongoing? Yes Radiation therapy? Yes Date Range: 04/07/2020 - 04/21/2020; Target: Left humerus Date Range: 04/02/2019 - 04/16/2019; Target: Left humerus * Tracking Code: BO * EXAM: CT CHEST, ABDOMEN, AND PELVIS WITH CONTRAST TECHNIQUE: Multidetector CT imaging of the chest, abdomen and pelvis was performed following the standard protocol during bolus administration of intravenous contrast. RADIATION DOSE REDUCTION: This exam was performed according to the departmental dose-optimization program which includes automated exposure control, adjustment of the mA and/or kV according to patient size and/or use of iterative reconstruction technique. CONTRAST:  116mL OMNIPAQUE IOHEXOL 300 MG/ML  SOLN COMPARISON:  Most recent CT chest, abdomen and pelvis 12/22/2021. 02/24/2019 PET-CT. FINDINGS: CT CHEST FINDINGS Cardiovascular: Right Port-A-Cath tip: Right atrium. Coronary, aortic arch, and branch vessel atherosclerotic vascular disease. Severe proximal branch vessel stenosis. Mild cardiomegaly. Mediastinum/Nodes: Right paratracheal node 1.8 cm in short axis on image 20 series 5, previously 1.9 cm measured in the same fashion to not include other lymph nodes. The upper left paratracheal lymph node measures 1.0 cm in short axis on image 17 series 5, formerly 1.8 cm. Frothy material in upper esophagus for example on image 30 series 7, possibly from dysmotility or reflux. Lungs/Pleura: Peripheral subpleural consolidation in the left upper lobe measures up to 1.6 cm in thickness on image 34 series 7, previously 1.2 cm by my measurement, and subjectively also appears mildly enlarged. The enlargement is generalized rather than focal. Subpleural reticulation compatible with  fibrosis/UIP. Cylindrical bronchiectasis in the right lower lobe. Musculoskeletal: Left reverse shoulder arthroplasty with suspected fluid collection medial to the left proximal humerus example on image 13 series 5. Severe arthropathy of the right glenohumeral joint with partial resorption of the right humeral head and flattening of the right glenoid. Thoracic spondylosis. CT ABDOMEN PELVIS FINDINGS Hepatobiliary: Unremarkable Pancreas: Unremarkable Spleen: Unremarkable Adrenals/Urinary Tract: Both adrenal glands appear normal. Continue to prominent right extrarenal pelvis and moderate hydronephrosis on the right, but without delayed excretion of contrast, query chronic right UPJ narrowing. No hydroureter. Urinary bladder unremarkable. Stomach/Bowel: Unremarkable Vascular/Lymphatic: Prominent atherosclerosis of the abdominal aorta, proximal celiac trunk, proximal SMA, iliac vessels, and proximal renal arteries. Occluded segment of the right common iliac artery with reconstitution near the bifurcation. No pathologic adenopathy identified. Reproductive: Uterus absent.  Adnexa unremarkable. Other: No supplemental non-categorized findings. Musculoskeletal: Grade 1 retrolisthesis at T12-L1, L1-2, and L2-3 with lumbar spondylosis and degenerative disc disease contributing to multilevel impingement. Postoperative findings in the lower lumbar spine. IMPRESSION: 1. Reduced adenopathy in the mediastinum. However, the subpleural density laterally in the left upper lobe is mildly larger than previous. This may represent evolutionary findings related to prior  radiation therapy but merits surveillance. 2. Markedly severe atherosclerosis causing substantial vascular narrowings for example in the proximal branch vessels from the aortic arch and in the mesenteric vasculature. Occluded right common iliac artery with reconstitution near the bifurcation. Today's exam was not performed as a CT angiogram. 3. Other imaging findings of  potential clinical significance: Aortic Atherosclerosis (ICD10-I70.0). Coronary atherosclerosis. Mild cardiomegaly. Frothy material in upper esophagus probably from dysmotility or reflux. Peripheral pulmonary fibrosis/UIP. Suspected fluid collection in the soft tissues medial to the left proximal humerus. Prominent right extrarenal pelvis and moderate right hydronephrosis without hydroureter, query chronic right UPJ narrowing. Multilevel thoracolumbar impingement. Electronically Signed   By: Van Clines M.D.   On: 03/16/2022 11:36   CT Abdomen Pelvis W Contrast  Result Date: 03/16/2022 CLINICAL DATA:  Primary Cancer Type: Lung Imaging Indication: Assess response to therapy Interval therapy since last imaging? Yes Initial Cancer Diagnosis Date: 03/03/2019; Established by: Biopsy-proven Detailed Pathology: Extensive stage small cell lung cancer. Primary Tumor location: Right upper lobe. 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 Surgeries: No thoracic. Hysterectomy. Lumbar laminectomy. Left shoulder reverse arthoplasty 2021. Chemotherapy: Yes; Ongoing?  No; most recent administration: 2020 Immunotherapy?  Yes; Type: Imfinzi; Ongoing? Yes Radiation therapy? Yes Date Range: 04/07/2020 - 04/21/2020; Target: Left humerus Date Range: 04/02/2019 - 04/16/2019; Target: Left humerus * Tracking Code: BO * EXAM: CT CHEST, ABDOMEN, AND PELVIS WITH CONTRAST TECHNIQUE: Multidetector CT imaging of the chest, abdomen and pelvis was performed following the standard protocol during bolus administration of intravenous contrast. RADIATION DOSE REDUCTION: This exam was performed according to the departmental dose-optimization program which includes automated exposure control, adjustment of the mA and/or kV according to patient size and/or use of iterative reconstruction technique. CONTRAST:  189mL OMNIPAQUE IOHEXOL 300 MG/ML  SOLN COMPARISON:  Most recent CT chest, abdomen and pelvis 12/22/2021. 02/24/2019 PET-CT. FINDINGS: CT  CHEST FINDINGS Cardiovascular: Right Port-A-Cath tip: Right atrium. Coronary, aortic arch, and branch vessel atherosclerotic vascular disease. Severe proximal branch vessel stenosis. Mild cardiomegaly. Mediastinum/Nodes: Right paratracheal node 1.8 cm in short axis on image 20 series 5, previously 1.9 cm measured in the same fashion to not include other lymph nodes. The upper left paratracheal lymph node measures 1.0 cm in short axis on image 17 series 5, formerly 1.8 cm. Frothy material in upper esophagus for example on image 30 series 7, possibly from dysmotility or reflux. Lungs/Pleura: Peripheral subpleural consolidation in the left upper lobe measures up to 1.6 cm in thickness on image 34 series 7, previously 1.2 cm by my measurement, and subjectively also appears mildly enlarged. The enlargement is generalized rather than focal. Subpleural reticulation compatible with fibrosis/UIP. Cylindrical bronchiectasis in the right lower lobe. Musculoskeletal: Left reverse shoulder arthroplasty with suspected fluid collection medial to the left proximal humerus example on image 13 series 5. Severe arthropathy of the right glenohumeral joint with partial resorption of the right humeral head and flattening of the right glenoid. Thoracic spondylosis. CT ABDOMEN PELVIS FINDINGS Hepatobiliary: Unremarkable Pancreas: Unremarkable Spleen: Unremarkable Adrenals/Urinary Tract: Both adrenal glands appear normal. Continue to prominent right extrarenal pelvis and moderate hydronephrosis on the right, but without delayed excretion of contrast, query chronic right UPJ narrowing. No hydroureter. Urinary bladder unremarkable. Stomach/Bowel: Unremarkable Vascular/Lymphatic: Prominent atherosclerosis of the abdominal aorta, proximal celiac trunk, proximal SMA, iliac vessels, and proximal renal arteries. Occluded segment of the right common iliac artery with reconstitution near the bifurcation. No pathologic adenopathy identified.  Reproductive: Uterus absent.  Adnexa unremarkable. Other: No supplemental non-categorized findings. Musculoskeletal:  Grade 1 retrolisthesis at T12-L1, L1-2, and L2-3 with lumbar spondylosis and degenerative disc disease contributing to multilevel impingement. Postoperative findings in the lower lumbar spine. IMPRESSION: 1. Reduced adenopathy in the mediastinum. However, the subpleural density laterally in the left upper lobe is mildly larger than previous. This may represent evolutionary findings related to prior radiation therapy but merits surveillance. 2. Markedly severe atherosclerosis causing substantial vascular narrowings for example in the proximal branch vessels from the aortic arch and in the mesenteric vasculature. Occluded right common iliac artery with reconstitution near the bifurcation. Today's exam was not performed as a CT angiogram. 3. Other imaging findings of potential clinical significance: Aortic Atherosclerosis (ICD10-I70.0). Coronary atherosclerosis. Mild cardiomegaly. Frothy material in upper esophagus probably from dysmotility or reflux. Peripheral pulmonary fibrosis/UIP. Suspected fluid collection in the soft tissues medial to the left proximal humerus. Prominent right extrarenal pelvis and moderate right hydronephrosis without hydroureter, query chronic right UPJ narrowing. Multilevel thoracolumbar impingement. Electronically Signed   By: Van Clines M.D.   On: 03/16/2022 11:36     ASSESSMENT AND PLAN: This is a very pleasant 81 years old white female with extensive stage small cell lung cancer and she is currently undergoing treatment with carboplatin, etoposide and Imfinzi status post 5 cycles.  Starting from cycle #6 the patient is on maintenance treatment with single agent Imfinzi every 4 weeks status post 40 cycles of total treatment. The patient has been tolerating her treatment with Imfinzi fairly well with no concerning adverse effects. She had repeat CT scan of the  chest, abdomen pelvis as well as CT of the left arm performed recently.  I personally and independently reviewed her scan and discussed the result with the patient today. Her scan of the chest, abdomen pelvis showed no concerning findings for disease progression and there was reduced adenopathy in the mediastinum.  The scan of the left arm showed postoperative changes of the total left shoulder arthroplasty and there was no concerning finding for disease progression in that area with some abnormal lucency that likely degenerative in origin. I recommended for the patient to continue her current treatment with Imfinzi and she will proceed with cycle #41 today. The patient will come back for follow-up visit in 4 weeks for evaluation before the next cycle of her treatment. For the peripheral neuropathy, she will continue her current treatment with gabapentin and I will give the patient refill of her prescription today. She was advised to call immediately if she has any other concerning symptoms in the interval. The patient voices understanding of current disease status and treatment options and is in agreement with the current care plan.  All questions were answered. The patient knows to call the clinic with any problems, questions or concerns. We can certainly see the patient much sooner if necessary.  Disclaimer: This note was dictated with voice recognition software. Similar sounding words can inadvertently be transcribed and may not be corrected upon review.

## 2022-03-22 NOTE — Patient Instructions (Signed)
[facility]  Discharge Instructions: Thank you for choosing [facility] to provide your oncology and hematology care.   If you have a lab appointment with the Cancer Center, please go directly to the Cancer Center and check in at the registration area.   Wear comfortable clothing and clothing appropriate for easy access to any Portacath or PICC line.   We strive to give you quality time with your provider. You may need to reschedule your appointment if you arrive late (15 or more minutes).  Arriving late affects you and other patients whose appointments are after yours.  Also, if you miss three or more appointments without notifying the office, you may be dismissed from the clinic at the provider's discretion.      For prescription refill requests, have your pharmacy contact our office and allow 72 hours for refills to be completed.    Today you received the following chemotherapy and/or immunotherapy agents Imfinzi      To help prevent nausea and vomiting after your treatment, we encourage you to take your nausea medication as directed.  BELOW ARE SYMPTOMS THAT SHOULD BE REPORTED IMMEDIATELY: *FEVER GREATER THAN 100.4 F (38 C) OR HIGHER *CHILLS OR SWEATING *NAUSEA AND VOMITING THAT IS NOT CONTROLLED WITH YOUR NAUSEA MEDICATION *UNUSUAL SHORTNESS OF BREATH *UNUSUAL BRUISING OR BLEEDING *URINARY PROBLEMS (pain or burning when urinating, or frequent urination) *BOWEL PROBLEMS (unusual diarrhea, constipation, pain near the anus) TENDERNESS IN MOUTH AND THROAT WITH OR WITHOUT PRESENCE OF ULCERS (sore throat, sores in mouth, or a toothache) UNUSUAL RASH, SWELLING OR PAIN  UNUSUAL VAGINAL DISCHARGE OR ITCHING   Items with * indicate a potential emergency and should be followed up as soon as possible or go to the Emergency Department if any problems should occur.  Please show the CHEMOTHERAPY ALERT CARD or IMMUNOTHERAPY ALERT CARD at check-in to the  Emergency Department and triage nurse.  Should you have questions after your visit or need to cancel or reschedule your appointment, please contact [facility]  Dept: [phone]  and follow the prompts.  Office hours are 8:00 a.m. to 4:30 p.m. Monday - Friday. Please note that voicemails left after 4:00 p.m. may not be returned until the following business day.  We are closed weekends and major holidays. You have access to a nurse at all times for urgent questions. Please call the main number to the clinic Dept: [phone] and follow the prompts.   For any non-urgent questions, you may also contact your provider using MyChart. We now offer e-Visits for anyone 18 and older to request care online for non-urgent symptoms. For details visit [email or web address].   Also download the MyChart app! Go to the app store, search "MyChart", open the app, select , and log in with your MyChart username and password.  Due to Covid, a mask is required upon entering the hospital/clinic. If you do not have a mask, one will be given to you upon arrival. For doctor visits, patients may have 1 support [place] Nies aged 18 or older with them. For treatment visits, patients cannot have anyone with them due to current Covid guidelines and our immunocompromised population.   

## 2022-03-23 ENCOUNTER — Telehealth: Payer: Self-pay | Admitting: Medical Oncology

## 2022-03-23 NOTE — Telephone Encounter (Signed)
Faxed Ct scan and labs to Dr Susie Cassette office. His office will call pt with appt.

## 2022-03-26 ENCOUNTER — Other Ambulatory Visit: Payer: Federal, State, Local not specified - PPO

## 2022-03-26 ENCOUNTER — Ambulatory Visit: Payer: Federal, State, Local not specified - PPO

## 2022-03-26 ENCOUNTER — Ambulatory Visit: Payer: Federal, State, Local not specified - PPO | Admitting: Internal Medicine

## 2022-03-26 ENCOUNTER — Ambulatory Visit: Payer: Federal, State, Local not specified - PPO | Admitting: Physician Assistant

## 2022-04-02 ENCOUNTER — Other Ambulatory Visit: Payer: Self-pay | Admitting: Internal Medicine

## 2022-04-02 DIAGNOSIS — G6289 Other specified polyneuropathies: Secondary | ICD-10-CM

## 2022-04-08 NOTE — Progress Notes (Signed)
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  PRIOR THERAPY:  1) Palliative radiotherapy to the left proximal humerus under the care of Dr. Lisbeth Renshaw. 2) Additional palliative radiotherapy to the proximal humerus under the care of Dr. Lisbeth Renshaw. Last treatment on 04/21/2020  CURRENT THERAPY:  Palliative systemic chemotherapy with carboplatin for AUC of 5 on day 1, etoposide 100 mg/M2 on days 1, 2 and 3 as well as Imfinzi 1500 mg every 3 weeks with the chemotherapy and Neulasta support. First dose on 03/16/2019. Status post 41 cycles. Starting from cycle #5 the patient will be treated with maintenance treatment with Imfinzi 1500 mg IV every 4 weeks.  INTERVAL HISTORY: Brittany Marks 81 y.o. female returns to the clinic today for a follow-up visit.  The patient is feeling fairly well today without any concerning complaints. She reports her baseline fatigue. The patient denies any recent fever, chills, or night sweats.  Her weight is stable.  She denies any chest pain or hemoptysis.  She reports her baseline dyspnea on minimal exertion. She reports her mild occasional chronic cough which comes and goes. She denies any chest pain or hemoptysis.  She denies any nausea, vomiting, diarrhea, or constipation. We are monitoring a slightly enlarging lymph node closely on imaging. If this continues to enlarge, we will likely refer to radiation. She denies any headache or visual changes. She denies any rashes or skin changes except for dry flaky skin. She still has limited mobility in her left shoulder where she previously had radiation and surgery on the metastatic bone lesion in 2020. Her most  recent CT was faxed to Dr. Susie Cassette office. She is schedule to see him on Wednesday of this week. She has had "four" surgeries on her back for multilevel degenerative disc disease and multilevel impingement which was re-demonstrated on her most recent CT scan. She is wondering if she can have a prescription for prednisone. She is here today for evaluation and repeat blood work before starting cycle #42  MEDICAL HISTORY: Past Medical History:  Diagnosis Date   Cataract    Bilateral   Constipation    pain medication   Left rotator cuff tear    Right rotator cuff tear    SCL CA dx'd 11/2018    ALLERGIES:  has No Known Allergies.  MEDICATIONS:  Current Outpatient Medications  Medication Sig Dispense Refill   Ascorbic Acid (VITAMIN C PO) Take by mouth daily.      b complex vitamins tablet Take 1 tablet by mouth daily.     COD LIVER OIL PO Take 1 tablet by mouth daily.      cyclobenzaprine (FLEXERIL) 10 MG tablet Take 1 tablet (10 mg total) by mouth 3 (three) times daily as needed for muscle spasms. 30 tablet 1   gabapentin (NEURONTIN) 100 MG capsule TAKE 2 CAPSULES(200 MG) BY MOUTH TWICE DAILY 90 capsule 2   HYDROcodone-acetaminophen (NORCO) 5-325 MG tablet Take 1 tablet by mouth every 6 (six) hours as needed for moderate pain. 20 tablet 0   hydrocortisone 2.5 % cream Apply topically as needed. (Patient taking differently: Apply 1 application  topically daily as needed (Treatment).) 3.5 g 0   ibuprofen (ADVIL) 200  MG tablet Take 600 mg by mouth daily.     lidocaine-prilocaine (EMLA) cream Apply 1 application topically every 30 (thirty) days.      Multiple Vitamin (MULTIVITAMIN) tablet Take 1 tablet by mouth daily.     ondansetron (ZOFRAN) 4 MG tablet Take 1 tablet (4 mg total) by mouth every 8 (eight) hours as needed for nausea or vomiting. 10 tablet 0   oxymetazoline (AFRIN) 0.05 % nasal spray Place 2 sprays into the nose 2 (two) times daily as needed (Bloody nose).      prochlorperazine  (COMPAZINE) 10 MG tablet Take 1 tablet (10 mg total) by mouth every 6 (six) hours as needed for nausea or vomiting. 30 tablet 0   silver sulfADIAZINE (SILVADENE) 1 % cream Apply 1 application topically 2 (two) times daily. 50 g 1   sodium chloride (OCEAN) 0.65 % nasal spray Place 1 spray into the nose at bedtime.     VITAMIN E PO Take 1 tablet by mouth daily.      No current facility-administered medications for this visit.   Facility-Administered Medications Ordered in Other Visits  Medication Dose Route Frequency Provider Last Rate Last Admin   heparin lock flush 100 unit/mL  500 Units Intracatheter Once PRN Magrinat, Virgie Dad, MD       sodium chloride flush (NS) 0.9 % injection 10 mL  10 mL Intracatheter PRN Magrinat, Virgie Dad, MD        SURGICAL HISTORY:  Past Surgical History:  Procedure Laterality Date   CATARACT EXTRACTION W/ INTRAOCULAR LENS  IMPLANT, BILATERAL  2012   DECOMPRESSIVE LUMBAR LAMINECTOMY LEVEL 2 N/A 07/29/2013   Procedure: LUMBAR LAMINECTOMY, DECOMPRESSION LUMBAR THREE TO FOUR, FOUR TO FIVE microdiscectomy l3,4 right;  Surgeon: Tobi Bastos, MD;  Location: [facility];  Service: Orthopedics;  Laterality: N/A;   I & D SUPERIOR RIGHT SHOULDER AND CLOSURE WOUND  01-10-2011   S/P ROTATOR CUFF REPAIR   IR IMAGING GUIDED PORT INSERTION  04/09/2019   LUMBAR LAMINECTOMY  1970'S   LUMBAR LAMINECTOMY/DECOMPRESSION MICRODISCECTOMY N/A 06/27/2016   Procedure: L1 - L2 DISCECTOMY;  Surgeon: Melina Schools, MD;  Location: [facility];  Service: Orthopedics;  Laterality: N/A;   LUMBAR SPINE SURGERY  1983   REVERSE SHOULDER ARTHROPLASTY Left 03/10/2020   Procedure: REVERSE SHOULDER ARTHROPLASTY;  Surgeon: Justice Britain, MD;  Location: [facility];  Service: Orthopedics;  Laterality: Left;  139min   RIGHT SHOULDER ARTHROSCOPY/ OPEN DISTAL CLAVICLE RESECTION/ SAD/ OPEN ROTATOR CUFF REPAIR  11-28-2010   SHOULDER FUSION SURGERY  03/11/2020   Shoulder Surgery , hardware placed   SHOULDER OPEN ROTATOR  CUFF REPAIR  12/19/2011   Procedure: ROTATOR CUFF REPAIR SHOULDER OPEN;  Surgeon: Magnus Sinning, MD;  Location: [facility];  Service: Orthopedics;  Laterality: Right;  RIGHT RECURRENT OPEN REPAIR OF THE ROTATOR CUFF WITH TISSUE MEND GRAFTANTERIOR CHROMIOECTOMY   VAGINAL HYSTERECTOMY  1979    REVIEW OF SYSTEMS:   Constitutional: Positive for fatigue.  Negative for appetite change, chills, fever and unexpected weight change.  HENT: Negative for mouth sores, nosebleeds, sore throat and trouble swallowing.   Eyes: Negative for eye problems and icterus.  Respiratory: Positive for stable dyspnea on exertion and mild intermittent chronic cough.  Negative for hemoptysis and wheezing.   Cardiovascular: Negative for chest pain and leg swelling.  Gastrointestinal: Negative for abdominal pain, recent diarrhea, constipation, diarrhea, nausea and vomiting.  Genitourinary: Negative for bladder incontinence, difficulty urinating, dysuria, frequency and hematuria.   Musculoskeletal: Positive  for chronic back pain. Negative for gait problem, neck pain and neck stiffness.  Skin: Negative for itching and rash.  Neurological: Positive for peripheral neuropathy. Negative for dizziness, extremity weakness, gait problem, headaches, light-headedness and seizures.  Hematological: Negative for adenopathy. Does not bruise/bleed easily.  Psychiatric/Behavioral: Negative for confusion, depression and sleep disturbance. The patient is not nervous/anxious.     PHYSICAL EXAMINATION:  Blood pressure (!) 149/71, pulse 76, temperature 98.1 F (36.7 C), temperature source Temporal, resp. rate 17, height 5\' 2"  (1.575 m), weight 142 lb 6.4 oz (64.6 kg), SpO2 92 %.  ECOG PERFORMANCE STATUS: 2  Physical Exam  Constitutional: Oriented to person, place, and time and well-developed, well-nourished, and in no distress.  Head: Normocephalic and atraumatic. Mouth/Throat: Oropharynx is clear and moist. No  oropharyngeal exudate. Eyes: Conjunctivae are normal. Right eye exhibits no discharge. Left eye exhibits no discharge. No scleral icterus. Neck: Normal range of motion. Neck supple. Cardiovascular: Normal rate, regular rhythm, systolic murmur noted and intact distal pulses.   Pulmonary/Chest: Effort normal and breath sounds normal except crackles noted bilaterally in the lung bases. No respiratory distress. No wheezes. Abdominal: Soft. Bowel sounds are normal. Exhibits no distension and no mass. There is no tenderness.  Musculoskeletal: Limited range of motion in left shoulder. Exhibits no edema.  Lymphadenopathy:    No cervical adenopathy.  Neurological: Alert and oriented to person, place, and time. Exhibits muscle wasting.  The patient was examined in the wheelchair.  Skin: No vesicles. Dry skin. Skin is warm and dry. Not diaphoretic. No pallor.  Psychiatric: Mood, memory and judgment normal. Vitals reviewed.  LABORATORY DATA: Lab Results  Component Value Date   WBC 8.3 04/16/2022   HGB 12.2 04/16/2022   HCT 35.3 (L) 04/16/2022   MCV 93.4 04/16/2022   PLT 273 04/16/2022      Chemistry      Component Value Date/Time   NA 138 04/16/2022 1417   K 3.9 04/16/2022 1417   CL 105 04/16/2022 1417   CO2 28 04/16/2022 1417   BUN 18 04/16/2022 1417   CREATININE 0.72 04/16/2022 1417      Component Value Date/Time   CALCIUM 9.0 04/16/2022 1417   ALKPHOS 80 04/16/2022 1417   AST 20 04/16/2022 1417   ALT 13 04/16/2022 1417   BILITOT 0.5 04/16/2022 1417       RADIOGRAPHIC STUDIES:  No results found.   ASSESSMENT/PLAN:  This is a very pleasant 81 year old Caucasian female diagnosed with extensive stage (T1b, N3, M1 C) small cell lung cancer.  She presented with right upper lobe pulmonary nodules in addition to bilateral hilar and right mediastinal as well as left supraclavicular and abdominal lymphadenopathy.  She also has metastatic disease to the in the left humerus.  She was  diagnosed in May 2020.   She is status post palliative radiotherapy to the left proximal humerus under the care of Dr. Lisbeth Renshaw in 2020.  She is currently undergoing palliative systemic chemotherapy with carboplatin for AUC of 5 on day 1, etoposide 100 mg/M2 on days 1, 2 and 3 as well as Imfinzi 1500 mg every 3 weeks with the chemotherapy and Neulasta support. Status post 41 cycles. Starting from cycle #5 the patient has been treated with maintenance treatment with Imfinzi 1500 mg IV every 4 weeks.   She had a pathological fracture in the left humerus underwent additional radiation to this area. Last treatment was 04/21/20.   Previous CT scans have noted in size of pretracheal lymph nodes.  Dr. Julien Nordmann previously personally compared the current images to the previous one and he did not see a significant change from the previous scan but we will continue to monitor disease mediastinal lymph nodes closely on the upcoming imaging studies and consider radiation to this area if it enlarges. She is not due for a scan at this time.    Labs were reviewed.  Recommend that she proceed with cycle #42 today scheduled.   We will see her back for follow-up visit in 4 weeks for evaluation and to review her scan before starting cycle #43  The patient has chronic back pain secondary to numerous surgeries for degenerative disc disease resulting in multilevel impingement.  She is scheduled to see Dr. Onnie Graham later this week regarding her shoulder.  The patient is requesting prednisone.  Discussed with the patient that prednisone is not ideal to be taking concomitantly with immunotherapy as it can impact the effectiveness of her immunotherapy.  Would encourage Tylenol, heating pads, and gabapentin for now and to discuss alternative options for management of her back pain with her orthopedic provider at her upcoming appointment.  The patient's most recent scan from a few weeks ago did not Marks any malignancy in this region. I  offered giving her tylenol in the infusion room today. She declined.    The patient was advised to call immediately if she has any concerning symptoms in the interval. The patient voices understanding of current disease status and treatment options and is in agreement with the current care plan. All questions were answered. The patient knows to call the clinic with any problems, questions or concerns. We can certainly see the patient much sooner if necessary         Orders Placed This Encounter  Procedures   CBC with Differential ([facility] Only)    Standing Status:   Standing    Number of Occurrences:   10    Standing Expiration Date:   04/17/2023   CMP ([facility] only)    Standing Status:   Standing    Number of Occurrences:   10    Standing Expiration Date:   04/17/2023     The total time spent in the appointment was 20-29 minutes.   Kindall Swaby L Idalee Foxworthy, PA-C 04/16/22

## 2022-04-16 ENCOUNTER — Inpatient Hospital Stay (HOSPITAL_BASED_OUTPATIENT_CLINIC_OR_DEPARTMENT_OTHER): Payer: Federal, State, Local not specified - PPO | Admitting: Physician Assistant

## 2022-04-16 ENCOUNTER — Ambulatory Visit: Payer: Federal, State, Local not specified - PPO | Admitting: Physician Assistant

## 2022-04-16 ENCOUNTER — Ambulatory Visit: Payer: Federal, State, Local not specified - PPO

## 2022-04-16 ENCOUNTER — Inpatient Hospital Stay: Payer: Federal, State, Local not specified - PPO

## 2022-04-16 ENCOUNTER — Other Ambulatory Visit: Payer: Federal, State, Local not specified - PPO

## 2022-04-16 ENCOUNTER — Other Ambulatory Visit: Payer: Self-pay

## 2022-04-16 ENCOUNTER — Inpatient Hospital Stay: Payer: Federal, State, Local not specified - PPO | Attending: Oncology

## 2022-04-16 VITALS — BP 104/67 | HR 70 | Temp 98.3°F | Resp 18

## 2022-04-16 VITALS — BP 149/71 | HR 76 | Temp 98.1°F | Resp 17 | Ht 62.0 in | Wt 142.4 lb

## 2022-04-16 DIAGNOSIS — C3411 Malignant neoplasm of upper lobe, right bronchus or lung: Secondary | ICD-10-CM | POA: Insufficient documentation

## 2022-04-16 DIAGNOSIS — R0602 Shortness of breath: Secondary | ICD-10-CM | POA: Diagnosis not present

## 2022-04-16 DIAGNOSIS — Z923 Personal history of irradiation: Secondary | ICD-10-CM | POA: Insufficient documentation

## 2022-04-16 DIAGNOSIS — G8929 Other chronic pain: Secondary | ICD-10-CM | POA: Insufficient documentation

## 2022-04-16 DIAGNOSIS — C7951 Secondary malignant neoplasm of bone: Secondary | ICD-10-CM | POA: Diagnosis not present

## 2022-04-16 DIAGNOSIS — K5903 Drug induced constipation: Secondary | ICD-10-CM | POA: Diagnosis not present

## 2022-04-16 DIAGNOSIS — T402X5A Adverse effect of other opioids, initial encounter: Secondary | ICD-10-CM | POA: Insufficient documentation

## 2022-04-16 DIAGNOSIS — Z791 Long term (current) use of non-steroidal anti-inflammatories (NSAID): Secondary | ICD-10-CM | POA: Insufficient documentation

## 2022-04-16 DIAGNOSIS — Z9221 Personal history of antineoplastic chemotherapy: Secondary | ICD-10-CM | POA: Diagnosis not present

## 2022-04-16 DIAGNOSIS — Z79899 Other long term (current) drug therapy: Secondary | ICD-10-CM | POA: Insufficient documentation

## 2022-04-16 DIAGNOSIS — Z5112 Encounter for antineoplastic immunotherapy: Secondary | ICD-10-CM | POA: Diagnosis not present

## 2022-04-16 DIAGNOSIS — M549 Dorsalgia, unspecified: Secondary | ICD-10-CM | POA: Diagnosis not present

## 2022-04-16 DIAGNOSIS — Z95828 Presence of other vascular implants and grafts: Secondary | ICD-10-CM

## 2022-04-16 LAB — CBC WITH DIFFERENTIAL (CANCER CENTER ONLY)
Abs Immature Granulocytes: 0.03 10*3/uL (ref 0.00–0.07)
Basophils Absolute: 0 10*3/uL (ref 0.0–0.1)
Basophils Relative: 1 %
Eosinophils Absolute: 0.3 10*3/uL (ref 0.0–0.5)
Eosinophils Relative: 4 %
HCT: 35.3 % — ABNORMAL LOW (ref 36.0–46.0)
Hemoglobin: 12.2 g/dL (ref 12.0–15.0)
Immature Granulocytes: 0 %
Lymphocytes Relative: 16 %
Lymphs Abs: 1.3 10*3/uL (ref 0.7–4.0)
MCH: 32.3 pg (ref 26.0–34.0)
MCHC: 34.6 g/dL (ref 30.0–36.0)
MCV: 93.4 fL (ref 80.0–100.0)
Monocytes Absolute: 0.7 10*3/uL (ref 0.1–1.0)
Monocytes Relative: 8 %
Neutro Abs: 6 10*3/uL (ref 1.7–7.7)
Neutrophils Relative %: 71 %
Platelet Count: 273 10*3/uL (ref 150–400)
RBC: 3.78 MIL/uL — ABNORMAL LOW (ref 3.87–5.11)
RDW: 13.8 % (ref 11.5–15.5)
WBC Count: 8.3 10*3/uL (ref 4.0–10.5)
nRBC: 0 % (ref 0.0–0.2)

## 2022-04-16 LAB — CMP (CANCER CENTER ONLY)
ALT: 13 U/L (ref 0–44)
AST: 20 U/L (ref 15–41)
Albumin: 3.7 g/dL (ref 3.5–5.0)
Alkaline Phosphatase: 80 U/L (ref 38–126)
Anion gap: 5 (ref 5–15)
BUN: 18 mg/dL (ref 8–23)
CO2: 28 mmol/L (ref 22–32)
Calcium: 9 mg/dL (ref 8.9–10.3)
Chloride: 105 mmol/L (ref 98–111)
Creatinine: 0.72 mg/dL (ref 0.44–1.00)
GFR, Estimated: >60 mL/min (ref >60)
Glucose, Bld: 139 mg/dL — ABNORMAL HIGH (ref 70–99)
Potassium: 3.9 mmol/L (ref 3.5–5.1)
Sodium: 138 mmol/L (ref 135–145)
Total Bilirubin: 0.5 mg/dL (ref 0.3–1.2)
Total Protein: 7.1 g/dL (ref 6.5–8.1)

## 2022-04-16 LAB — TSH: TSH: 1.774 u[IU]/mL (ref 0.350–4.500)

## 2022-04-16 MED ORDER — SODIUM CHLORIDE 0.9 % IV SOLN
1500.0000 mg | Freq: Once | INTRAVENOUS | Status: AC
  Administered 2022-04-16: 1500 mg via INTRAVENOUS
  Filled 2022-04-16: qty 30

## 2022-04-16 MED ORDER — SODIUM CHLORIDE 0.9% FLUSH
10.0000 mL | INTRAVENOUS | Status: DC | PRN
  Administered 2022-04-16: 10 mL

## 2022-04-16 MED ORDER — SODIUM CHLORIDE 0.9 % IV SOLN: Freq: Once | INTRAVENOUS | Status: AC

## 2022-04-16 MED ORDER — HEPARIN SOD (PORK) LOCK FLUSH 100 UNIT/ML IV SOLN
500.0000 [IU] | Freq: Once | INTRAVENOUS | Status: AC | PRN
  Administered 2022-04-16: 500 [IU]

## 2022-04-16 NOTE — Patient Instructions (Signed)
[facility]  Discharge Instructions: Thank you for choosing [facility] to provide your oncology and hematology care.   If you have a lab appointment with the [facility], please go directly to the [facility] and check in at the registration area.   Wear comfortable clothing and clothing appropriate for easy access to any Portacath or PICC line.   We strive to give you quality time with your provider. You may need to reschedule your appointment if you arrive late (15 or more minutes).  Arriving late affects you and other patients whose appointments are after yours.  Also, if you miss three or more appointments without notifying the office, you may be dismissed from the clinic at the provider's discretion.      For prescription refill requests, have your pharmacy contact our office and allow 72 hours for refills to be completed.    Today you received the following chemotherapy and/or immunotherapy agents: Imfinzi      To help prevent nausea and vomiting after your treatment, we encourage you to take your nausea medication as directed.  BELOW ARE SYMPTOMS THAT SHOULD BE REPORTED IMMEDIATELY: *FEVER GREATER THAN 100.4 F (38 C) OR HIGHER *CHILLS OR SWEATING *NAUSEA AND VOMITING THAT IS NOT CONTROLLED WITH YOUR NAUSEA MEDICATION *UNUSUAL SHORTNESS OF BREATH *UNUSUAL BRUISING OR BLEEDING *URINARY PROBLEMS (pain or burning when urinating, or frequent urination) *BOWEL PROBLEMS (unusual diarrhea, constipation, pain near the anus) TENDERNESS IN MOUTH AND THROAT WITH OR WITHOUT PRESENCE OF ULCERS (sore throat, sores in mouth, or a toothache) UNUSUAL RASH, SWELLING OR PAIN  UNUSUAL VAGINAL DISCHARGE OR ITCHING   Items with * indicate a potential emergency and should be followed up as soon as possible or go to the Emergency Department if any problems should occur.  Please show the CHEMOTHERAPY ALERT CARD or IMMUNOTHERAPY ALERT CARD at check-in to  the Emergency Department and triage nurse.  Should you have questions after your visit or need to cancel or reschedule your appointment, please contact [facility]  Dept: [phone]  and follow the prompts.  Office hours are 8:00 a.m. to 4:30 p.m. Monday - Friday. Please note that voicemails left after 4:00 p.m. may not be returned until the following business day.  We are closed weekends and major holidays. You have access to a nurse at all times for urgent questions. Please call the main number to the clinic Dept: [phone] and follow the prompts.   For any non-urgent questions, you may also contact your provider using MyChart. We now offer e-Visits for anyone 57 and older to request care online for non-urgent symptoms. For details visit [email or web address].   Also download the MyChart app! Go to the app store, search "MyChart", open the app, select [facility], and log in with your MyChart username and password.  Masks are optional in the cancer centers. If you would like for your care team to wear a mask while they are taking care of you, please let them know. For doctor visits, patients may have with them one support person who is at least 81 years old. At this time, visitors are not allowed in the infusion area.

## 2022-04-18 ENCOUNTER — Telehealth: Payer: Self-pay | Admitting: Medical Oncology

## 2022-04-18 NOTE — Telephone Encounter (Signed)
Please call pt re : management of  low back pain .

## 2022-04-19 ENCOUNTER — Telehealth: Payer: Self-pay | Admitting: Medical Oncology

## 2022-04-19 NOTE — Telephone Encounter (Signed)
Dr Susie Cassette office called yesterday and requested if we can manage her lower back pain -  She is taking ibuprofen 600 mg bid with mild relief. She does not want anything habit forming I.e "narcotics". Pt had back surgery in 2017-Dr Dehari Brooks. No PCP.   Please advise.

## 2022-04-23 ENCOUNTER — Other Ambulatory Visit: Payer: Self-pay | Admitting: Internal Medicine

## 2022-04-23 MED ORDER — OXYCODONE-ACETAMINOPHEN 5-325 MG PO TABS: 1.0000 | ORAL_TABLET | Freq: Three times a day (TID) | ORAL | 0 refills | Status: DC | PRN

## 2022-04-25 ENCOUNTER — Telehealth: Payer: Self-pay | Admitting: Internal Medicine

## 2022-04-25 NOTE — Telephone Encounter (Signed)
Called patient regarding upcoming August, September and October appointments. Patient is notified.

## 2022-04-30 ENCOUNTER — Other Ambulatory Visit: Payer: Self-pay

## 2022-05-01 ENCOUNTER — Other Ambulatory Visit: Payer: Self-pay

## 2022-05-02 ENCOUNTER — Other Ambulatory Visit: Payer: Self-pay | Admitting: Medical Oncology

## 2022-05-02 ENCOUNTER — Telehealth: Payer: Self-pay | Admitting: Medical Oncology

## 2022-05-02 ENCOUNTER — Other Ambulatory Visit: Payer: Self-pay | Admitting: Physician Assistant

## 2022-05-02 DIAGNOSIS — G8929 Other chronic pain: Secondary | ICD-10-CM

## 2022-05-02 MED ORDER — METHYLPREDNISOLONE 4 MG PO TBPK: ORAL_TABLET | ORAL | 0 refills | Status: DC

## 2022-05-02 NOTE — Telephone Encounter (Signed)
Medication reaction - I stopped Oxycodone .  " I threw up , walked like I was drunk after taking it". Last dose yesterday.  "It did nothing for my back pain". Is there anything else to prescribe. Prednisone helped.  Pt notified that medrol dose pack rx sent to her pharmacy. Pt declined orthopedic surgery referral. " I do not want to go back to orthopedics".

## 2022-05-03 ENCOUNTER — Other Ambulatory Visit: Payer: Self-pay

## 2022-05-14 ENCOUNTER — Ambulatory Visit: Payer: Federal, State, Local not specified - PPO

## 2022-05-14 ENCOUNTER — Inpatient Hospital Stay: Payer: Federal, State, Local not specified - PPO | Attending: Oncology

## 2022-05-14 ENCOUNTER — Ambulatory Visit: Payer: Federal, State, Local not specified - PPO | Admitting: Internal Medicine

## 2022-05-14 ENCOUNTER — Inpatient Hospital Stay: Payer: Federal, State, Local not specified - PPO

## 2022-05-14 ENCOUNTER — Inpatient Hospital Stay (HOSPITAL_BASED_OUTPATIENT_CLINIC_OR_DEPARTMENT_OTHER): Payer: Federal, State, Local not specified - PPO | Admitting: Internal Medicine

## 2022-05-14 ENCOUNTER — Encounter: Payer: Self-pay | Admitting: Internal Medicine

## 2022-05-14 ENCOUNTER — Other Ambulatory Visit: Payer: Self-pay

## 2022-05-14 ENCOUNTER — Other Ambulatory Visit: Payer: Federal, State, Local not specified - PPO

## 2022-05-14 VITALS — BP 147/85 | HR 72 | Temp 97.9°F | Resp 18 | Wt 144.2 lb

## 2022-05-14 VITALS — BP 101/63 | HR 75 | Temp 97.6°F | Resp 18

## 2022-05-14 DIAGNOSIS — Z791 Long term (current) use of non-steroidal anti-inflammatories (NSAID): Secondary | ICD-10-CM | POA: Insufficient documentation

## 2022-05-14 DIAGNOSIS — C3411 Malignant neoplasm of upper lobe, right bronchus or lung: Secondary | ICD-10-CM

## 2022-05-14 DIAGNOSIS — C7951 Secondary malignant neoplasm of bone: Secondary | ICD-10-CM | POA: Insufficient documentation

## 2022-05-14 DIAGNOSIS — K5900 Constipation, unspecified: Secondary | ICD-10-CM | POA: Diagnosis not present

## 2022-05-14 DIAGNOSIS — Z95828 Presence of other vascular implants and grafts: Secondary | ICD-10-CM

## 2022-05-14 DIAGNOSIS — Z79899 Other long term (current) drug therapy: Secondary | ICD-10-CM | POA: Diagnosis not present

## 2022-05-14 DIAGNOSIS — Z5112 Encounter for antineoplastic immunotherapy: Secondary | ICD-10-CM | POA: Diagnosis not present

## 2022-05-14 LAB — CBC WITH DIFFERENTIAL (CANCER CENTER ONLY)
Abs Immature Granulocytes: 0.05 10*3/uL (ref 0.00–0.07)
Basophils Absolute: 0 10*3/uL (ref 0.0–0.1)
Basophils Relative: 0 %
Eosinophils Absolute: 0.2 10*3/uL (ref 0.0–0.5)
Eosinophils Relative: 3 %
HCT: 35.1 % — ABNORMAL LOW (ref 36.0–46.0)
Hemoglobin: 12.4 g/dL (ref 12.0–15.0)
Immature Granulocytes: 1 %
Lymphocytes Relative: 14 %
Lymphs Abs: 1.3 10*3/uL (ref 0.7–4.0)
MCH: 33.4 pg (ref 26.0–34.0)
MCHC: 35.3 g/dL (ref 30.0–36.0)
MCV: 94.6 fL (ref 80.0–100.0)
Monocytes Absolute: 0.8 10*3/uL (ref 0.1–1.0)
Monocytes Relative: 9 %
Neutro Abs: 6.6 10*3/uL (ref 1.7–7.7)
Neutrophils Relative %: 73 %
Platelet Count: 262 10*3/uL (ref 150–400)
RBC: 3.71 MIL/uL — ABNORMAL LOW (ref 3.87–5.11)
RDW: 14 % (ref 11.5–15.5)
WBC Count: 9 10*3/uL (ref 4.0–10.5)
nRBC: 0 % (ref 0.0–0.2)

## 2022-05-14 LAB — CMP (CANCER CENTER ONLY)
ALT: 15 U/L (ref 0–44)
AST: 22 U/L (ref 15–41)
Albumin: 3.4 g/dL — ABNORMAL LOW (ref 3.5–5.0)
Alkaline Phosphatase: 71 U/L (ref 38–126)
Anion gap: 7 (ref 5–15)
BUN: 15 mg/dL (ref 8–23)
CO2: 25 mmol/L (ref 22–32)
Calcium: 8.8 mg/dL — ABNORMAL LOW (ref 8.9–10.3)
Chloride: 106 mmol/L (ref 98–111)
Creatinine: 0.77 mg/dL (ref 0.44–1.00)
GFR, Estimated: >60 mL/min (ref >60)
Glucose, Bld: 105 mg/dL — ABNORMAL HIGH (ref 70–99)
Potassium: 4.2 mmol/L (ref 3.5–5.1)
Sodium: 138 mmol/L (ref 135–145)
Total Bilirubin: 0.4 mg/dL (ref 0.3–1.2)
Total Protein: 7 g/dL (ref 6.5–8.1)

## 2022-05-14 LAB — TSH: TSH: 1.406 u[IU]/mL (ref 0.350–4.500)

## 2022-05-14 MED ORDER — HEPARIN SOD (PORK) LOCK FLUSH 100 UNIT/ML IV SOLN
500.0000 [IU] | Freq: Once | INTRAVENOUS | Status: AC | PRN
  Administered 2022-05-14: 500 [IU]

## 2022-05-14 MED ORDER — SODIUM CHLORIDE 0.9% FLUSH
10.0000 mL | Freq: Once | INTRAVENOUS | Status: AC
  Administered 2022-05-14: 10 mL

## 2022-05-14 MED ORDER — SODIUM CHLORIDE 0.9 % IV SOLN
1500.0000 mg | Freq: Once | INTRAVENOUS | Status: AC
  Administered 2022-05-14: 1500 mg via INTRAVENOUS
  Filled 2022-05-14: qty 30

## 2022-05-14 MED ORDER — SODIUM CHLORIDE 0.9% FLUSH
10.0000 mL | INTRAVENOUS | Status: DC | PRN
  Administered 2022-05-14: 10 mL

## 2022-05-14 MED ORDER — SODIUM CHLORIDE 0.9 % IV SOLN: Freq: Once | INTRAVENOUS | Status: AC

## 2022-05-14 NOTE — Patient Instructions (Signed)
[facility]  Discharge Instructions: Thank you for choosing [facility] to provide your oncology and hematology care.   If you have a lab appointment with the [facility], please go directly to the [facility] and check in at the registration area.   Wear comfortable clothing and clothing appropriate for easy access to any Portacath or PICC line.   We strive to give you quality time with your provider. You may need to reschedule your appointment if you arrive late (15 or more minutes).  Arriving late affects you and other patients whose appointments are after yours.  Also, if you miss three or more appointments without notifying the office, you may be dismissed from the clinic at the provider's discretion.      For prescription refill requests, have your pharmacy contact our office and allow 72 hours for refills to be completed.    Today you received the following chemotherapy and/or immunotherapy agents: Imfinzi      To help prevent nausea and vomiting after your treatment, we encourage you to take your nausea medication as directed.  BELOW ARE SYMPTOMS THAT SHOULD BE REPORTED IMMEDIATELY: *FEVER GREATER THAN 100.4 F (38 C) OR HIGHER *CHILLS OR SWEATING *NAUSEA AND VOMITING THAT IS NOT CONTROLLED WITH YOUR NAUSEA MEDICATION *UNUSUAL SHORTNESS OF BREATH *UNUSUAL BRUISING OR BLEEDING *URINARY PROBLEMS (pain or burning when urinating, or frequent urination) *BOWEL PROBLEMS (unusual diarrhea, constipation, pain near the anus) TENDERNESS IN MOUTH AND THROAT WITH OR WITHOUT PRESENCE OF ULCERS (sore throat, sores in mouth, or a toothache) UNUSUAL RASH, SWELLING OR PAIN  UNUSUAL VAGINAL DISCHARGE OR ITCHING   Items with * indicate a potential emergency and should be followed up as soon as possible or go to the Emergency Department if any problems should occur.  Please show the CHEMOTHERAPY ALERT CARD or IMMUNOTHERAPY ALERT CARD at check-in to  the Emergency Department and triage nurse.  Should you have questions after your visit or need to cancel or reschedule your appointment, please contact [facility]  Dept: [phone]  and follow the prompts.  Office hours are 8:00 a.m. to 4:30 p.m. Monday - Friday. Please note that voicemails left after 4:00 p.m. may not be returned until the following business day.  We are closed weekends and major holidays. You have access to a nurse at all times for urgent questions. Please call the main number to the clinic Dept: [phone] and follow the prompts.   For any non-urgent questions, you may also contact your provider using MyChart. We now offer e-Visits for anyone 70 and older to request care online for non-urgent symptoms. For details visit [email or web address].   Also download the MyChart app! Go to the app store, search "MyChart", open the app, select [facility], and log in with your MyChart username and password.  Masks are optional in the cancer centers. If you would like for your care team to wear a mask while they are taking care of you, please let them know. For doctor visits, patients may have with them one support person who is at least 81 years old. At this time, visitors are not allowed in the infusion area.

## 2022-05-14 NOTE — Patient Instructions (Signed)
Steps to Quit Smoking Smoking tobacco is the leading cause of preventable death. It can affect almost every organ in the body. Smoking puts you and people around you at risk for many serious, long-lasting (chronic) diseases. Quitting smoking can be hard, but it is one of the best things that you can do for your health. It is never too late to quit. Do not give up if you cannot quit the first time. Some people need to try many times to quit. Do your best to stick to your quit plan, and talk with your doctor if you have any questions or concerns. How do I get ready to quit? Pick a date to quit. Set a date within the next 2 weeks to give you time to prepare. Write down the reasons why you are quitting. Keep this list in places where you will see it often. Tell your family, friends, and co-workers that you are quitting. Their support is important. Talk with your doctor about the choices that may help you quit. Find out if your health insurance will pay for these treatments. Know the people, places, things, and activities that make you want to smoke (triggers). Avoid them. What first steps can I take to quit smoking? Throw away all cigarettes at home, at work, and in your car. Throw away the things that you use when you smoke, such as ashtrays and lighters. Clean your car. Empty the ashtray. Clean your home, including curtains and carpets. What can I do to help me quit smoking? Talk with your doctor about taking medicines and seeing a counselor. You are more likely to succeed when you do both. If you are pregnant or breastfeeding: Talk with your doctor about counseling or other ways to quit smoking. Do not take medicine to help you quit smoking unless your doctor tells you to. Quit right away Quit smoking completely, instead of slowly cutting back on how much you smoke over a period of time. Stopping smoking right away may be more successful than slowly quitting. Go to counseling. In-person is best  if this is an option. You are more likely to quit if you go to counseling sessions regularly. Take medicine You may take medicines to help you quit. Some medicines need a prescription, and some you can buy over-the-counter. Some medicines may contain a drug called nicotine to replace the nicotine in cigarettes. Medicines may: Help you stop having the desire to smoke (cravings). Help to stop the problems that come when you stop smoking (withdrawal symptoms). Your doctor may ask you to use: Nicotine patches, gum, or lozenges. Nicotine inhalers or sprays. Non-nicotine medicine that you take by mouth. Find resources Find resources and other ways to help you quit smoking and remain smoke-free after you quit. They include: Online chats with a counselor. Phone quitlines. Printed self-help materials. Support groups or group counseling. Text messaging programs. Mobile phone apps. Use apps on your mobile phone or tablet that can help you stick to your quit plan. Examples of free services include Quit Guide from the CDC and smokefree.gov  What can I do to make it easier to quit?  Talk to your family and friends. Ask them to support and encourage you. Call a phone quitline, such as 1-800-QUIT-NOW, reach out to support groups, or work with a counselor. Ask people who smoke to not smoke around you. Avoid places that make you want to smoke, such as: Bars. Parties. Smoke-break areas at work. Spend time with people who do not smoke. Lower   the stress in your life. Stress can make you want to smoke. Try these things to lower stress: Getting regular exercise. Doing deep-breathing exercises. Doing yoga. Meditating. What benefits will I see if I quit smoking? Over time, you may have: A better sense of smell and taste. Less coughing and sore throat. A slower heart rate. Lower blood pressure. Clearer skin. Better breathing. Fewer sick days. Summary Quitting smoking can be hard, but it is one of  the best things that you can do for your health. Do not give up if you cannot quit the first time. Some people need to try many times to quit. When you decide to quit smoking, make a plan to help you succeed. Quit smoking right away, not slowly over a period of time. When you start quitting, get help and support to keep you smoke-free. This information is not intended to replace advice given to you by your health care provider. Make sure you discuss any questions you have with your health care provider. Document Revised: 09/15/2021 Document Reviewed: 09/15/2021 Elsevier Patient Education  2023 Elsevier Inc.  

## 2022-05-14 NOTE — Progress Notes (Signed)
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   PRIOR THERAPY: None   CURRENT THERAPY: Palliative systemic chemotherapy with carboplatin for AUC of 5 on day 1, etoposide 100 mg/M2 on days 1, 2 and 3 as well as Imfinzi 1500 mg every 3 weeks with the chemotherapy and Neulasta support. First dose on 03/16/2019. Status post 42  cycles.   Starting from cycle #5 the patient will be treated with maintenance treatment with Imfinzi 1500 mg IV every 4 weeks.  INTERVAL HISTORY: Brittany Marks 81 y.o. female returns to the clinic today for follow-up visit.  The patient has no complaints today.  She completed a course of prednisone for her left shoulder pain recently.  She is feeling a little bit better she continues to take Advil twice daily for pain management.  She has no current chest pain, shortness of breath except with exertion with no cough or hemoptysis.  She has no nausea, vomiting, diarrhea or constipation.  She has no headache or visual changes.  She is here today for evaluation before starting cycle #43 of her maintenance treatment.   MEDICAL HISTORY: Past Medical History:  Diagnosis Date   Cataract    Bilateral   Constipation    pain medication   Left rotator cuff tear    Right rotator cuff tear    SCL CA dx'd 11/2018    ALLERGIES:  is allergic to oxycodone.  MEDICATIONS:  Current Outpatient Medications  Medication Sig Dispense Refill   Ascorbic Acid (VITAMIN C PO) Take by mouth daily.      b complex vitamins tablet Take 1 tablet by mouth daily.     COD LIVER OIL PO Take 1 tablet by mouth daily.       gabapentin (NEURONTIN) 100 MG capsule TAKE 2 CAPSULES(200 MG) BY MOUTH TWICE DAILY 90 capsule 2   hydrocortisone 2.5 % cream Apply topically as needed. (Patient taking differently: Apply 1 application  topically daily as needed (Treatment).) 3.5 g 0   ibuprofen (ADVIL) 200 MG tablet Take 600 mg by mouth daily.     lidocaine-prilocaine (EMLA) cream Apply 1 application topically every 30 (thirty) days.      Multiple Vitamin (MULTIVITAMIN) tablet Take 1 tablet by mouth daily.     VITAMIN E PO Take 1 tablet by mouth daily.      ondansetron (ZOFRAN) 4 MG tablet Take 1 tablet (4 mg total) by mouth every 8 (eight) hours as needed for nausea or vomiting. (Patient not taking: Reported on 05/14/2022) 10 tablet 0   oxymetazoline (AFRIN) 0.05 % nasal spray Place 2 sprays into the nose 2 (two) times daily as needed (Bloody nose).  (Patient not taking: Reported on 05/14/2022)     prochlorperazine (COMPAZINE) 10 MG tablet Take 1 tablet (10 mg total) by mouth every 6 (six) hours as needed for nausea or vomiting. (Patient not taking: Reported on 05/14/2022) 30 tablet 0   No current facility-administered medications for this visit.   Facility-Administered Medications Ordered in Other Visits  Medication Dose Route Frequency Provider Last Rate Last Admin   heparin lock flush 100 unit/mL  500 Units  Intracatheter Once PRN Magrinat, Virgie Dad, MD       sodium chloride flush (NS) 0.9 % injection 10 mL  10 mL Intracatheter PRN Magrinat, Virgie Dad, MD        SURGICAL HISTORY:  Past Surgical History:  Procedure Laterality Date   CATARACT EXTRACTION W/ INTRAOCULAR LENS  IMPLANT, BILATERAL  2012   DECOMPRESSIVE LUMBAR LAMINECTOMY LEVEL 2 N/A 07/29/2013   Procedure: LUMBAR LAMINECTOMY, DECOMPRESSION LUMBAR THREE TO FOUR, FOUR TO FIVE microdiscectomy l3,4 right;  Surgeon: Tobi Bastos, MD;  Location: [facility];  Service: Orthopedics;  Laterality: N/A;   I & D SUPERIOR RIGHT SHOULDER AND CLOSURE WOUND  01-10-2011   S/P ROTATOR  CUFF REPAIR   IR IMAGING GUIDED PORT INSERTION  04/09/2019   LUMBAR LAMINECTOMY  1970'S   LUMBAR LAMINECTOMY/DECOMPRESSION MICRODISCECTOMY N/A 06/27/2016   Procedure: L1 - L2 DISCECTOMY;  Surgeon: Melina Schools, MD;  Location: [place];  Service: Orthopedics;  Laterality: N/A;   LUMBAR SPINE SURGERY  1983   REVERSE SHOULDER ARTHROPLASTY Left 03/10/2020   Procedure: REVERSE SHOULDER ARTHROPLASTY;  Surgeon: Justice Britain, MD;  Location: [facility];  Service: Orthopedics;  Laterality: Left;  171min   RIGHT SHOULDER ARTHROSCOPY/ OPEN DISTAL CLAVICLE RESECTION/ SAD/ OPEN ROTATOR CUFF REPAIR  11-28-2010   SHOULDER FUSION SURGERY  03/11/2020   Shoulder Surgery , hardware placed   SHOULDER OPEN ROTATOR CUFF REPAIR  12/19/2011   Procedure: ROTATOR CUFF REPAIR SHOULDER OPEN;  Surgeon: Magnus Sinning, MD;  Location: [place];  Service: Orthopedics;  Laterality: Right;  RIGHT RECURRENT OPEN REPAIR OF THE ROTATOR CUFF WITH TISSUE MEND GRAFTANTERIOR CHROMIOECTOMY   VAGINAL HYSTERECTOMY  1979    REVIEW OF SYSTEMS:  A comprehensive review of systems was negative except for: Constitutional: positive for fatigue Musculoskeletal: positive for arthralgias   PHYSICAL EXAMINATION: General appearance: alert, cooperative, fatigued, and no distress Head: Normocephalic, without obvious abnormality, atraumatic Neck: no adenopathy, no JVD, supple, symmetrical, trachea midline, and thyroid not enlarged, symmetric, no tenderness/mass/nodules Lymph nodes: Cervical, supraclavicular, and axillary nodes normal. Resp: clear to auscultation bilaterally Back: symmetric, no curvature. ROM normal. No CVA tenderness. Cardio: regular rate and rhythm, S1, S2 normal, no murmur, click, rub or gallop GI: soft, non-tender; bowel sounds normal; no masses,  no organomegaly Extremities: extremities normal, atraumatic, no cyanosis or edema   ECOG PERFORMANCE STATUS: 1 - Symptomatic but completely ambulatory  Blood pressure (!)  147/85, pulse 72, temperature 97.9 F (36.6 C), temperature source Oral, resp. rate 18, weight 144 lb 3.2 oz (65.4 kg), SpO2 95 %.  LABORATORY DATA: Lab Results  Component Value Date   WBC 9.0 05/14/2022   HGB 12.4 05/14/2022   HCT 35.1 (L) 05/14/2022   MCV 94.6 05/14/2022   PLT 262 05/14/2022      Chemistry      Component Value Date/Time   NA 138 04/16/2022 1417   K 3.9 04/16/2022 1417   CL 105 04/16/2022 1417   CO2 28 04/16/2022 1417   BUN 18 04/16/2022 1417   CREATININE 0.72 04/16/2022 1417      Component Value Date/Time   CALCIUM 9.0 04/16/2022 1417   ALKPHOS 80 04/16/2022 1417   AST 20 04/16/2022 1417   ALT 13 04/16/2022 1417   BILITOT 0.5 04/16/2022 1417       RADIOGRAPHIC STUDIES: No results found.   ASSESSMENT AND PLAN: This is a very pleasant 81 years old white female with extensive stage small cell lung cancer and she is currently  undergoing treatment with carboplatin, etoposide and Imfinzi status post 5 cycles.  Starting from cycle #6 the patient is on maintenance treatment with single agent Imfinzi every 4 weeks status post 42 cycles of total treatment. She has been tolerating this treatment well with no concerning adverse effects. I recommended for her to proceed with cycle #43 today as planned. We are supposed to have repeat CT scan of the chest, abdomen and pelvis after this cycle but the patient would like to delay it by 1 more month.  She has some concern about the diarrhea and adverse effect from the oral contrast. I will see her back for follow-up visit in 1 months for evaluation before the next cycle of her treatment. For the peripheral neuropathy, she will continue her current treatment with gabapentin and I will give the patient refill of her prescription today. She was advised to call immediately if she has any other concerning symptoms in the interval. The patient voices understanding of current disease status and treatment options and is in  agreement with the current care plan.  All questions were answered. The patient knows to call the clinic with any problems, questions or concerns. We can certainly see the patient much sooner if necessary.  Disclaimer: This note was dictated with voice recognition software. Similar sounding words can inadvertently be transcribed and may not be corrected upon review.

## 2022-05-31 ENCOUNTER — Other Ambulatory Visit: Payer: Self-pay

## 2022-06-09 ENCOUNTER — Other Ambulatory Visit: Payer: Self-pay

## 2022-06-12 ENCOUNTER — Inpatient Hospital Stay: Payer: Federal, State, Local not specified - PPO | Attending: Oncology

## 2022-06-12 ENCOUNTER — Inpatient Hospital Stay (HOSPITAL_BASED_OUTPATIENT_CLINIC_OR_DEPARTMENT_OTHER): Payer: Federal, State, Local not specified - PPO | Admitting: Internal Medicine

## 2022-06-12 ENCOUNTER — Inpatient Hospital Stay: Payer: Federal, State, Local not specified - PPO

## 2022-06-12 ENCOUNTER — Ambulatory Visit: Payer: Federal, State, Local not specified - PPO | Admitting: Physician Assistant

## 2022-06-12 ENCOUNTER — Other Ambulatory Visit: Payer: Self-pay

## 2022-06-12 ENCOUNTER — Other Ambulatory Visit: Payer: Federal, State, Local not specified - PPO

## 2022-06-12 VITALS — BP 119/61 | HR 78 | Temp 97.9°F | Resp 15 | Wt 144.4 lb

## 2022-06-12 DIAGNOSIS — Z95828 Presence of other vascular implants and grafts: Secondary | ICD-10-CM

## 2022-06-12 DIAGNOSIS — Z5112 Encounter for antineoplastic immunotherapy: Secondary | ICD-10-CM | POA: Insufficient documentation

## 2022-06-12 DIAGNOSIS — C3411 Malignant neoplasm of upper lobe, right bronchus or lung: Secondary | ICD-10-CM | POA: Diagnosis present

## 2022-06-12 DIAGNOSIS — Z791 Long term (current) use of non-steroidal anti-inflammatories (NSAID): Secondary | ICD-10-CM | POA: Insufficient documentation

## 2022-06-12 DIAGNOSIS — Z79899 Other long term (current) drug therapy: Secondary | ICD-10-CM | POA: Insufficient documentation

## 2022-06-12 DIAGNOSIS — C3490 Malignant neoplasm of unspecified part of unspecified bronchus or lung: Secondary | ICD-10-CM | POA: Diagnosis not present

## 2022-06-12 DIAGNOSIS — G629 Polyneuropathy, unspecified: Secondary | ICD-10-CM | POA: Diagnosis not present

## 2022-06-12 LAB — CBC WITH DIFFERENTIAL (CANCER CENTER ONLY)
Abs Immature Granulocytes: 0.04 10*3/uL (ref 0.00–0.07)
Basophils Absolute: 0.1 10*3/uL (ref 0.0–0.1)
Basophils Relative: 1 %
Eosinophils Absolute: 0.3 10*3/uL (ref 0.0–0.5)
Eosinophils Relative: 3 %
HCT: 36.6 % (ref 36.0–46.0)
Hemoglobin: 12.7 g/dL (ref 12.0–15.0)
Immature Granulocytes: 0 %
Lymphocytes Relative: 16 %
Lymphs Abs: 1.5 10*3/uL (ref 0.7–4.0)
MCH: 32.4 pg (ref 26.0–34.0)
MCHC: 34.7 g/dL (ref 30.0–36.0)
MCV: 93.4 fL (ref 80.0–100.0)
Monocytes Absolute: 0.8 10*3/uL (ref 0.1–1.0)
Monocytes Relative: 9 %
Neutro Abs: 6.8 10*3/uL (ref 1.7–7.7)
Neutrophils Relative %: 71 %
Platelet Count: 257 10*3/uL (ref 150–400)
RBC: 3.92 MIL/uL (ref 3.87–5.11)
RDW: 13.6 % (ref 11.5–15.5)
WBC Count: 9.5 10*3/uL (ref 4.0–10.5)
nRBC: 0 % (ref 0.0–0.2)

## 2022-06-12 LAB — CMP (CANCER CENTER ONLY)
ALT: 11 U/L (ref 0–44)
AST: 20 U/L (ref 15–41)
Albumin: 3.8 g/dL (ref 3.5–5.0)
Alkaline Phosphatase: 76 U/L (ref 38–126)
Anion gap: 6 (ref 5–15)
BUN: 20 mg/dL (ref 8–23)
CO2: 29 mmol/L (ref 22–32)
Calcium: 9.1 mg/dL (ref 8.9–10.3)
Chloride: 103 mmol/L (ref 98–111)
Creatinine: 0.83 mg/dL (ref 0.44–1.00)
GFR, Estimated: >60 mL/min (ref >60)
Glucose, Bld: 116 mg/dL — ABNORMAL HIGH (ref 70–99)
Potassium: 3.7 mmol/L (ref 3.5–5.1)
Sodium: 138 mmol/L (ref 135–145)
Total Bilirubin: 0.3 mg/dL (ref 0.3–1.2)
Total Protein: 7 g/dL (ref 6.5–8.1)

## 2022-06-12 MED ORDER — SODIUM CHLORIDE 0.9% FLUSH
10.0000 mL | INTRAVENOUS | Status: DC | PRN
  Administered 2022-06-12: 10 mL

## 2022-06-12 MED ORDER — SODIUM CHLORIDE 0.9 % IV SOLN
1500.0000 mg | Freq: Once | INTRAVENOUS | Status: AC
  Administered 2022-06-12: 1500 mg via INTRAVENOUS
  Filled 2022-06-12: qty 30

## 2022-06-12 MED ORDER — HEPARIN SOD (PORK) LOCK FLUSH 100 UNIT/ML IV SOLN
500.0000 [IU] | Freq: Once | INTRAVENOUS | Status: AC | PRN
  Administered 2022-06-12: 500 [IU]

## 2022-06-12 MED ORDER — SODIUM CHLORIDE 0.9 % IV SOLN: Freq: Once | INTRAVENOUS | Status: AC

## 2022-06-12 NOTE — Progress Notes (Signed)
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   PRIOR THERAPY: None   CURRENT THERAPY: Palliative systemic chemotherapy with carboplatin for AUC of 5 on day 1, etoposide 100 mg/M2 on days 1, 2 and 3 as well as Imfinzi 1500 mg every 3 weeks with the chemotherapy and Neulasta support. First dose on 03/16/2019. Status post 43 cycles.   Starting from cycle #5 the patient will be treated with maintenance treatment with Imfinzi 1500 mg IV every 4 weeks.  INTERVAL HISTORY: Brittany Marks 81 y.o. female returns to the clinic today for follow-up visit.  The patient has no complaints today except for the baseline shortness of breath increased with exertion as well as a peripheral neuropathy.  Unfortunately she continues to smoke more than half a pack a day of cigarettes.  She is trying to quit but not working.  She denied having any current chest pain or hemoptysis.  She has no nausea, vomiting, diarrhea or constipation.  She has no headache or visual changes.  She is here today for evaluation before starting cycle #44 of her treatment.   MEDICAL HISTORY: Past Medical History:  Diagnosis Date   Cataract    Bilateral   Constipation    pain medication   Left rotator cuff tear    Right rotator cuff tear    SCL CA dx'd 11/2018    ALLERGIES:  is allergic to oxycodone.  MEDICATIONS:  Current Outpatient Medications  Medication Sig Dispense Refill   Ascorbic Acid (VITAMIN C PO) Take by mouth daily.      b complex vitamins tablet Take 1 tablet by mouth daily.     COD LIVER OIL PO Take 1 tablet by mouth daily.       gabapentin (NEURONTIN) 100 MG capsule TAKE 2 CAPSULES(200 MG) BY MOUTH TWICE DAILY 90 capsule 2   hydrocortisone 2.5 % cream Apply topically as needed. (Patient taking differently: Apply 1 application  topically daily as needed (Treatment).) 3.5 g 0   ibuprofen (ADVIL) 200 MG tablet Take 600 mg by mouth daily.     lidocaine-prilocaine (EMLA) cream Apply 1 application topically every 30 (thirty) days.      Multiple Vitamin (MULTIVITAMIN) tablet Take 1 tablet by mouth daily.     ondansetron (ZOFRAN) 4 MG tablet Take 1 tablet (4 mg total) by mouth every 8 (eight) hours as needed for nausea or vomiting. (Patient not taking: Reported on 05/14/2022) 10 tablet 0   oxymetazoline (AFRIN) 0.05 % nasal spray Place 2 sprays into the nose 2 (two) times daily as needed (Bloody nose).  (Patient not taking: Reported on 05/14/2022)     prochlorperazine (COMPAZINE) 10 MG tablet Take 1 tablet (10 mg total) by mouth every 6 (six) hours as needed for nausea or vomiting. (Patient not taking: Reported on 05/14/2022) 30 tablet 0   VITAMIN E PO Take 1 tablet by mouth daily.      No current facility-administered medications for this visit.   Facility-Administered Medications Ordered in Other Visits  Medication Dose Route Frequency Provider Last Rate Last Admin   heparin lock flush 100 unit/mL  500 Units Intracatheter  Once PRN Magrinat, Virgie Dad, MD       sodium chloride flush (NS) 0.9 % injection 10 mL  10 mL Intracatheter PRN Magrinat, Virgie Dad, MD        SURGICAL HISTORY:  Past Surgical History:  Procedure Laterality Date   CATARACT EXTRACTION W/ INTRAOCULAR LENS  IMPLANT, BILATERAL  2012   DECOMPRESSIVE LUMBAR LAMINECTOMY LEVEL 2 N/A 07/29/2013   Procedure: LUMBAR LAMINECTOMY, DECOMPRESSION LUMBAR THREE TO FOUR, FOUR TO FIVE microdiscectomy l3,4 right;  Surgeon: Tobi Bastos, MD;  Location: [facility];  Service: Orthopedics;  Laterality: N/A;   I & D SUPERIOR RIGHT SHOULDER AND CLOSURE WOUND  01-10-2011   S/P ROTATOR  CUFF REPAIR   IR IMAGING GUIDED PORT INSERTION  04/09/2019   LUMBAR LAMINECTOMY  1970'S   LUMBAR LAMINECTOMY/DECOMPRESSION MICRODISCECTOMY N/A 06/27/2016   Procedure: L1 - L2 DISCECTOMY;  Surgeon: Melina Schools, MD;  Location: [place];  Service: Orthopedics;  Laterality: N/A;   LUMBAR SPINE SURGERY  1983   REVERSE SHOULDER ARTHROPLASTY Left 03/10/2020   Procedure: REVERSE SHOULDER ARTHROPLASTY;  Surgeon: Justice Britain, MD;  Location: [facility];  Service: Orthopedics;  Laterality: Left;  136min   RIGHT SHOULDER ARTHROSCOPY/ OPEN DISTAL CLAVICLE RESECTION/ SAD/ OPEN ROTATOR CUFF REPAIR  11-28-2010   SHOULDER FUSION SURGERY  03/11/2020   Shoulder Surgery , hardware placed   SHOULDER OPEN ROTATOR CUFF REPAIR  12/19/2011   Procedure: ROTATOR CUFF REPAIR SHOULDER OPEN;  Surgeon: Magnus Sinning, MD;  Location: [place];  Service: Orthopedics;  Laterality: Right;  RIGHT RECURRENT OPEN REPAIR OF THE ROTATOR CUFF WITH TISSUE MEND GRAFTANTERIOR CHROMIOECTOMY   VAGINAL HYSTERECTOMY  1979    REVIEW OF SYSTEMS:  A comprehensive review of systems was negative except for: Constitutional: positive for fatigue Respiratory: positive for dyspnea on exertion Musculoskeletal: positive for arthralgias and muscle weakness Neurological: positive for paresthesia   PHYSICAL EXAMINATION: General appearance: alert, cooperative, fatigued, and no distress Head: Normocephalic, without obvious abnormality, atraumatic Neck: no adenopathy, no JVD, supple, symmetrical, trachea midline, and thyroid not enlarged, symmetric, no tenderness/mass/nodules Lymph nodes: Cervical, supraclavicular, and axillary nodes normal. Resp: clear to auscultation bilaterally Back: symmetric, no curvature. ROM normal. No CVA tenderness. Cardio: regular rate and rhythm, S1, S2 normal, no murmur, click, rub or gallop GI: soft, non-tender; bowel sounds normal; no masses,  no organomegaly Extremities: extremities normal, atraumatic, no  cyanosis or edema   ECOG PERFORMANCE STATUS: 1 - Symptomatic but completely ambulatory  Blood pressure 119/61, pulse 78, temperature 97.9 F (36.6 C), temperature source Oral, resp. rate 15, weight 144 lb 6.4 oz (65.5 kg), SpO2 93 %.  LABORATORY DATA: Lab Results  Component Value Date   WBC 9.5 06/12/2022   HGB 12.7 06/12/2022   HCT 36.6 06/12/2022   MCV 93.4 06/12/2022   PLT 257 06/12/2022      Chemistry      Component Value Date/Time   NA 138 05/14/2022 1413   K 4.2 05/14/2022 1413   CL 106 05/14/2022 1413   CO2 25 05/14/2022 1413   BUN 15 05/14/2022 1413   CREATININE 0.77 05/14/2022 1413      Component Value Date/Time   CALCIUM 8.8 (L) 05/14/2022 1413   ALKPHOS 71 05/14/2022 1413   AST 22 05/14/2022 1413   ALT 15 05/14/2022 1413   BILITOT 0.4 05/14/2022 1413       RADIOGRAPHIC STUDIES: No results found.   ASSESSMENT AND PLAN: This is a very pleasant 81 years old white female  with extensive stage small cell lung cancer and she is currently undergoing treatment with carboplatin, etoposide and Imfinzi status post 5 cycles.  Starting from cycle #6 the patient is on maintenance treatment with single agent Imfinzi every 4 weeks status post 43 cycles of total treatment. The patient has been tolerating this treatment fairly well with no concerning adverse effect except for the baseline fatigue. I recommended for her to proceed with cycle #44 today as planned. I will see her back for follow-up visit in 4 weeks for evaluation with repeat CT scan of the chest, abdomen and pelvis for restaging of her disease.  She is refusing the oral contrast with the scan. For the peripheral neuropathy, she will continue her current treatment with gabapentin and I will give the patient refill of her prescription today. She was advised to call immediately if she has any other concerning symptoms in the interval. The patient voices understanding of current disease status and treatment options  and is in agreement with the current care plan.  All questions were answered. The patient knows to call the clinic with any problems, questions or concerns. We can certainly see the patient much sooner if necessary.  Disclaimer: This note was dictated with voice recognition software. Similar sounding words can inadvertently be transcribed and may not be corrected upon review.

## 2022-06-12 NOTE — Patient Instructions (Signed)
[facility]  Discharge Instructions: Thank you for choosing [facility] to provide your oncology and hematology care.   If you have a lab appointment with the [facility], please go directly to the [facility] and check in at the registration area.   Wear comfortable clothing and clothing appropriate for easy access to any Portacath or PICC line.   We strive to give you quality time with your provider. You may need to reschedule your appointment if you arrive late (15 or more minutes).  Arriving late affects you and other patients whose appointments are after yours.  Also, if you miss three or more appointments without notifying the office, you may be dismissed from the clinic at the provider's discretion.      For prescription refill requests, have your pharmacy contact our office and allow 72 hours for refills to be completed.    Today you received the following chemotherapy and/or immunotherapy agent: Durvalumab (Imfinzi)   To help prevent nausea and vomiting after your treatment, we encourage you to take your nausea medication as directed.  BELOW ARE SYMPTOMS THAT SHOULD BE REPORTED IMMEDIATELY: *FEVER GREATER THAN 100.4 F (38 C) OR HIGHER *CHILLS OR SWEATING *NAUSEA AND VOMITING THAT IS NOT CONTROLLED WITH YOUR NAUSEA MEDICATION *UNUSUAL SHORTNESS OF BREATH *UNUSUAL BRUISING OR BLEEDING *URINARY PROBLEMS (pain or burning when urinating, or frequent urination) *BOWEL PROBLEMS (unusual diarrhea, constipation, pain near the anus) TENDERNESS IN MOUTH AND THROAT WITH OR WITHOUT PRESENCE OF ULCERS (sore throat, sores in mouth, or a toothache) UNUSUAL RASH, SWELLING OR PAIN  UNUSUAL VAGINAL DISCHARGE OR ITCHING   Items with * indicate a potential emergency and should be followed up as soon as possible or go to the Emergency Department if any problems should occur.  Please show the CHEMOTHERAPY ALERT CARD or IMMUNOTHERAPY ALERT CARD at  check-in to the Emergency Department and triage nurse.  Should you have questions after your visit or need to cancel or reschedule your appointment, please contact [facility]  Dept: [phone]  and follow the prompts.  Office hours are 8:00 a.m. to 4:30 p.m. Monday - Friday. Please note that voicemails left after 4:00 p.m. may not be returned until the following business day.  We are closed weekends and major holidays. You have access to a nurse at all times for urgent questions. Please call the main number to the clinic Dept: [phone] and follow the prompts.   For any non-urgent questions, you may also contact your provider using MyChart. We now offer e-Visits for anyone 75 and older to request care online for non-urgent symptoms. For details visit [email or web address].   Also download the MyChart app! Go to the app store, search "MyChart", open the app, select [facility], and log in with your MyChart username and password.  Masks are optional in the cancer centers. If you would like for your care team to wear a mask while they are taking care of you, please let them know. You may have one support person who is at least 81 years old accompany you for your appointments. Durvalumab Injection What is this medication? DURVALUMAB (dur VAL ue mab) treats some types of cancer. It works by helping your immune system slow or stop the spread of cancer cells. It is a monoclonal antibody. This medicine may be used for other purposes; ask your health care provider or pharmacist if you have questions. COMMON BRAND NAME(S): IMFINZI What should I tell my  care team before I take this medication? They need to know if you have any of these conditions: Allogeneic stem cell transplant (uses someone else's stem cells) Autoimmune diseases, such as Crohn disease, ulcerative colitis, lupus History of chest radiation Nervous system problems, such as Guillain-Barre syndrome,  myasthenia gravis Organ transplant An unusual or allergic reaction to durvalumab, other medications, foods, dyes, or preservatives Pregnant or trying to get pregnant Breast-feeding How should I use this medication? This medication is infused into a vein. It is given by your care team in a hospital or clinic setting. A special MedGuide will be given to you before each treatment. Be sure to read this information carefully each time. Talk to your care team about the use of this medication in children. Special care may be needed. Overdosage: If you think you have taken too much of this medicine contact a poison control center or emergency room at once. NOTE: This medicine is only for you. Do not share this medicine with others. What if I miss a dose? Keep appointments for follow-up doses. It is important not to miss your dose. Call your care team if you are unable to keep an appointment. What may interact with this medication? Interactions have not been studied. This list may not describe all possible interactions. Give your health care provider a list of all the medicines, herbs, non-prescription drugs, or dietary supplements you use. Also tell them if you smoke, drink alcohol, or use illegal drugs. Some items may interact with your medicine. What should I watch for while using this medication? Your condition will be monitored carefully while you are receiving this medication. You may need blood work while taking this medication. This medication may cause serious skin reactions. They can happen weeks to months after starting the medication. Contact your care team right away if you notice fevers or flu-like symptoms with a rash. The rash may be red or purple and then turn into blisters or peeling of the skin. You may also notice a red rash with swelling of the face, lips, or lymph nodes in your neck or under your arms. Tell your care team right away if you have any change in your eyesight. Talk to  your care team if you may be pregnant. Serious birth defects can occur if you take this medication during pregnancy and for 3 months after the last dose. You will need a negative pregnancy test before starting this medication. Contraception is recommended while taking this medication and for 3 months after the last dose. Your care team can help you find the option that works for you. Do not breastfeed while taking this medication and for 3 months after the last dose. What side effects may I notice from receiving this medication? Side effects that you should report to your care team as soon as possible: Allergic reactions--skin rash, itching, hives, swelling of the face, lips, tongue, or throat Dry cough, shortness of breath or trouble breathing Eye pain, redness, irritation, or discharge with blurry or decreased vision Heart muscle inflammation--unusual weakness or fatigue, shortness of breath, chest pain, fast or irregular heartbeat, dizziness, swelling of the ankles, feet, or hands Hormone gland problems--headache, sensitivity to light, unusual weakness or fatigue, dizziness, fast or irregular heartbeat, increased sensitivity to cold or heat, excessive sweating, constipation, hair loss, increased thirst or amount of urine, tremors or shaking, irritability Infusion reactions--chest pain, shortness of breath or trouble breathing, feeling faint or lightheaded Kidney injury (glomerulonephritis)--decrease in the amount of urine, red or  dark brown urine, foamy or bubbly urine, swelling of the ankles, hands, or feet Liver injury--right upper belly pain, loss of appetite, nausea, light-colored stool, dark yellow or brown urine, yellowing skin or eyes, unusual weakness or fatigue Pain, tingling, or numbness in the hands or feet, muscle weakness, change in vision, confusion or trouble speaking, loss of balance or coordination, trouble walking, seizures Rash, fever, and swollen lymph nodes Redness, blistering,  peeling, or loosening of the skin, including inside the mouth Sudden or severe stomach pain, bloody diarrhea, fever, nausea, vomiting Side effects that usually do not require medical attention (report these to your care team if they continue or are bothersome): Bone, joint, or muscle pain Diarrhea Fatigue Loss of appetite Nausea Skin rash This list may not describe all possible side effects. Call your doctor for medical advice about side effects. You may report side effects to FDA at 1-800-FDA-1088. Where should I keep my medication? This medication is given in a hospital or clinic. It will not be stored at home. NOTE: This sheet is a summary. It may not cover all possible information. If you have questions about this medicine, talk to your doctor, pharmacist, or health care provider.  2023 Elsevier/Gold Standard (2021-03-15 00:00:00)

## 2022-06-13 ENCOUNTER — Other Ambulatory Visit: Payer: Self-pay

## 2022-06-21 ENCOUNTER — Telehealth: Payer: Self-pay | Admitting: Internal Medicine

## 2022-06-21 NOTE — Telephone Encounter (Signed)
Called patient regarding October and November appointments, patient is notified.

## 2022-06-23 ENCOUNTER — Other Ambulatory Visit: Payer: Self-pay

## 2022-07-06 ENCOUNTER — Ambulatory Visit (HOSPITAL_COMMUNITY)
Admission: RE | Admit: 2022-07-06 | Discharge: 2022-07-06 | Disposition: A | Discharge status: Home / Self Care | Payer: Federal, State, Local not specified - PPO | Source: Ambulatory Visit | Attending: Internal Medicine | Admitting: Internal Medicine

## 2022-07-06 DIAGNOSIS — C3490 Malignant neoplasm of unspecified part of unspecified bronchus or lung: Secondary | ICD-10-CM | POA: Insufficient documentation

## 2022-07-06 MED ORDER — HEPARIN SOD (PORK) LOCK FLUSH 100 UNIT/ML IV SOLN: 500.0000 [IU] | Freq: Once | INTRAVENOUS | Status: AC

## 2022-07-06 MED ORDER — IOHEXOL 300 MG/ML  SOLN
100.0000 mL | Freq: Once | INTRAMUSCULAR | Status: AC | PRN
  Administered 2022-07-06: 100 mL via INTRAVENOUS

## 2022-07-06 MED ORDER — HEPARIN SOD (PORK) LOCK FLUSH 100 UNIT/ML IV SOLN
INTRAVENOUS | Status: AC
  Administered 2022-07-06: 500 [IU] via INTRAVENOUS
  Filled 2022-07-06: qty 5

## 2022-07-09 ENCOUNTER — Inpatient Hospital Stay: Payer: Federal, State, Local not specified - PPO

## 2022-07-09 ENCOUNTER — Inpatient Hospital Stay: Payer: Federal, State, Local not specified - PPO | Attending: Oncology

## 2022-07-09 ENCOUNTER — Other Ambulatory Visit: Payer: Federal, State, Local not specified - PPO

## 2022-07-09 ENCOUNTER — Ambulatory Visit: Payer: Federal, State, Local not specified - PPO | Admitting: Internal Medicine

## 2022-07-09 ENCOUNTER — Ambulatory Visit: Payer: Federal, State, Local not specified - PPO

## 2022-07-09 ENCOUNTER — Other Ambulatory Visit: Payer: Self-pay

## 2022-07-09 ENCOUNTER — Inpatient Hospital Stay (HOSPITAL_BASED_OUTPATIENT_CLINIC_OR_DEPARTMENT_OTHER): Payer: Federal, State, Local not specified - PPO | Admitting: Internal Medicine

## 2022-07-09 DIAGNOSIS — K5900 Constipation, unspecified: Secondary | ICD-10-CM | POA: Diagnosis not present

## 2022-07-09 DIAGNOSIS — Z79899 Other long term (current) drug therapy: Secondary | ICD-10-CM | POA: Insufficient documentation

## 2022-07-09 DIAGNOSIS — C3411 Malignant neoplasm of upper lobe, right bronchus or lung: Secondary | ICD-10-CM | POA: Insufficient documentation

## 2022-07-09 DIAGNOSIS — R059 Cough, unspecified: Secondary | ICD-10-CM | POA: Diagnosis not present

## 2022-07-09 DIAGNOSIS — C7951 Secondary malignant neoplasm of bone: Secondary | ICD-10-CM | POA: Diagnosis not present

## 2022-07-09 DIAGNOSIS — G629 Polyneuropathy, unspecified: Secondary | ICD-10-CM | POA: Diagnosis not present

## 2022-07-09 DIAGNOSIS — Z95828 Presence of other vascular implants and grafts: Secondary | ICD-10-CM

## 2022-07-09 DIAGNOSIS — Z923 Personal history of irradiation: Secondary | ICD-10-CM | POA: Insufficient documentation

## 2022-07-09 DIAGNOSIS — Z5112 Encounter for antineoplastic immunotherapy: Secondary | ICD-10-CM | POA: Insufficient documentation

## 2022-07-09 LAB — CBC WITH DIFFERENTIAL (CANCER CENTER ONLY)
Abs Immature Granulocytes: 0.02 10*3/uL (ref 0.00–0.07)
Basophils Absolute: 0.1 10*3/uL (ref 0.0–0.1)
Basophils Relative: 1 %
Eosinophils Absolute: 0.3 10*3/uL (ref 0.0–0.5)
Eosinophils Relative: 3 %
HCT: 36.2 % (ref 36.0–46.0)
Hemoglobin: 12.5 g/dL (ref 12.0–15.0)
Immature Granulocytes: 0 %
Lymphocytes Relative: 15 %
Lymphs Abs: 1.4 10*3/uL (ref 0.7–4.0)
MCH: 32.7 pg (ref 26.0–34.0)
MCHC: 34.5 g/dL (ref 30.0–36.0)
MCV: 94.8 fL (ref 80.0–100.0)
Monocytes Absolute: 0.8 10*3/uL (ref 0.1–1.0)
Monocytes Relative: 8 %
Neutro Abs: 6.9 10*3/uL (ref 1.7–7.7)
Neutrophils Relative %: 73 %
Platelet Count: 247 10*3/uL (ref 150–400)
RBC: 3.82 MIL/uL — ABNORMAL LOW (ref 3.87–5.11)
RDW: 13.8 % (ref 11.5–15.5)
WBC Count: 9.4 10*3/uL (ref 4.0–10.5)
nRBC: 0 % (ref 0.0–0.2)

## 2022-07-09 LAB — CMP (CANCER CENTER ONLY)
ALT: 13 U/L (ref 0–44)
AST: 21 U/L (ref 15–41)
Albumin: 3.9 g/dL (ref 3.5–5.0)
Alkaline Phosphatase: 81 U/L (ref 38–126)
Anion gap: 6 (ref 5–15)
BUN: 17 mg/dL (ref 8–23)
CO2: 29 mmol/L (ref 22–32)
Calcium: 8.8 mg/dL — ABNORMAL LOW (ref 8.9–10.3)
Chloride: 104 mmol/L (ref 98–111)
Creatinine: 0.68 mg/dL (ref 0.44–1.00)
GFR, Estimated: >60 mL/min (ref >60)
Glucose, Bld: 138 mg/dL — ABNORMAL HIGH (ref 70–99)
Potassium: 3.6 mmol/L (ref 3.5–5.1)
Sodium: 139 mmol/L (ref 135–145)
Total Bilirubin: 0.4 mg/dL (ref 0.3–1.2)
Total Protein: 6.7 g/dL (ref 6.5–8.1)

## 2022-07-09 MED ORDER — SODIUM CHLORIDE 0.9 % IV SOLN: Freq: Once | INTRAVENOUS | Status: AC

## 2022-07-09 MED ORDER — SODIUM CHLORIDE 0.9% FLUSH
10.0000 mL | INTRAVENOUS | Status: DC | PRN
  Administered 2022-07-09: 10 mL

## 2022-07-09 MED ORDER — SODIUM CHLORIDE 0.9 % IV SOLN
1500.0000 mg | Freq: Once | INTRAVENOUS | Status: AC
  Administered 2022-07-09: 1500 mg via INTRAVENOUS
  Filled 2022-07-09: qty 30

## 2022-07-09 MED ORDER — HEPARIN SOD (PORK) LOCK FLUSH 100 UNIT/ML IV SOLN
500.0000 [IU] | Freq: Once | INTRAVENOUS | Status: AC | PRN
  Administered 2022-07-09: 500 [IU]

## 2022-07-09 NOTE — Progress Notes (Signed)
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   PRIOR THERAPY: None   CURRENT THERAPY: Palliative systemic chemotherapy with carboplatin for AUC of 5 on day 1, etoposide 100 mg/M2 on days 1, 2 and 3 as well as Imfinzi 1500 mg every 3 weeks with the chemotherapy and Neulasta support. First dose on 03/16/2019. Status post 44 cycles.   Starting from cycle #5 the patient will be treated with maintenance treatment with Imfinzi 1500 mg IV every 4 weeks.  [name] 81 y.o. female returns to the clinic today for follow-up visit.  The patient is feeling fine today with no concerning complaints except for mild cough.  She denied having any chest pain, shortness of breath or hemoptysis.  She denied having any nausea, vomiting, diarrhea or constipation.  She has no headache or visual changes.  She denied having any fever or chills.  She has been tolerating her treatment with immunotherapy fairly well.  The patient had repeat CT scan of the chest, abdomen and pelvis performed recently and she is here for evaluation and discussion of her scan results before the next cycle of her treatment.   MEDICAL HISTORY: Past Medical History:  Diagnosis Date   Cataract    Bilateral   Constipation    pain medication   Left rotator cuff tear    Right rotator cuff tear    SCL CA dx'd 11/2018    ALLERGIES:  is allergic to oxycodone.  MEDICATIONS:  Current Outpatient Medications  Medication Sig Dispense Refill   Ascorbic Acid (VITAMIN C PO) Take by mouth daily.      b complex vitamins tablet Take 1 tablet by  mouth daily.     COD LIVER OIL PO Take 1 tablet by mouth daily.      gabapentin (NEURONTIN) 100 MG capsule TAKE 2 CAPSULES(200 MG) BY MOUTH TWICE DAILY 90 capsule 2   hydrocortisone 2.5 % cream Apply topically as needed. (Patient taking differently: Apply 1 application  topically daily as needed (Treatment).) 3.5 g 0   ibuprofen (ADVIL) 200 MG tablet Take 600 mg by mouth daily.     lidocaine-prilocaine (EMLA) cream Apply 1 application topically every 30 (thirty) days.      Multiple Vitamin (MULTIVITAMIN) tablet Take 1 tablet by mouth daily.     ondansetron (ZOFRAN) 4 MG tablet Take 1 tablet (4 mg total) by mouth every 8 (eight) hours as needed for nausea or vomiting. (Patient not taking: Reported on 05/14/2022) 10 tablet 0   oxymetazoline (AFRIN) 0.05 % nasal spray Place 2 sprays into the nose 2 (two) times daily as needed (Bloody nose).  (Patient not taking: Reported on 05/14/2022)     prochlorperazine (COMPAZINE) 10 MG tablet Take 1 tablet (10 mg total) by mouth every 6 (six) hours as needed for nausea or vomiting. (Patient not taking: Reported on 05/14/2022) 30 tablet 0   VITAMIN E PO Take 1 tablet by mouth daily.      No current facility-administered medications for this visit.   Facility-Administered Medications Ordered in Other Visits  Medication Dose Route Frequency Provider Last Rate Last Admin  heparin lock flush 100 unit/mL  500 Units Intracatheter Once PRN Magrinat, Virgie Dad, MD       sodium chloride flush (NS) 0.9 % injection 10 mL  10 mL Intracatheter PRN Magrinat, Virgie Dad, MD       sodium chloride flush (NS) 0.9 % injection 10 mL  10 mL Intracatheter PRN Curt Bears, MD   10 mL at 07/09/22 1115    SURGICAL HISTORY:  Past Surgical History:  Procedure Laterality Date   CATARACT EXTRACTION W/ INTRAOCULAR LENS  IMPLANT, BILATERAL  2012   DECOMPRESSIVE LUMBAR LAMINECTOMY LEVEL 2 N/A 07/29/2013   Procedure: LUMBAR LAMINECTOMY, DECOMPRESSION LUMBAR THREE TO FOUR, FOUR TO FIVE  microdiscectomy l3,4 right;  Surgeon: Tobi Bastos, MD;  Location: [facility];  Service: Orthopedics;  Laterality: N/A;   I & D SUPERIOR RIGHT SHOULDER AND CLOSURE WOUND  01-10-2011   S/P ROTATOR CUFF REPAIR   IR IMAGING GUIDED PORT INSERTION  04/09/2019   LUMBAR LAMINECTOMY  1970'S   LUMBAR LAMINECTOMY/DECOMPRESSION MICRODISCECTOMY N/A 06/27/2016   Procedure: L1 - L2 DISCECTOMY;  Surgeon: Melina Schools, MD;  Location: [facility];  Service: Orthopedics;  Laterality: N/A;   LUMBAR SPINE SURGERY  1983   REVERSE SHOULDER ARTHROPLASTY Left 03/10/2020   Procedure: REVERSE SHOULDER ARTHROPLASTY;  Surgeon: Justice Britain, MD;  Location: [facility];  Service: Orthopedics;  Laterality: Left;  176min   RIGHT SHOULDER ARTHROSCOPY/ OPEN DISTAL CLAVICLE RESECTION/ SAD/ OPEN ROTATOR CUFF REPAIR  11-28-2010   SHOULDER FUSION SURGERY  03/11/2020   Shoulder Surgery , hardware placed   SHOULDER OPEN ROTATOR CUFF REPAIR  12/19/2011   Procedure: ROTATOR CUFF REPAIR SHOULDER OPEN;  Surgeon: Magnus Sinning, MD;  Location: [facility];  Service: Orthopedics;  Laterality: Right;  RIGHT RECURRENT OPEN REPAIR OF THE ROTATOR CUFF WITH TISSUE MEND [place]   VAGINAL HYSTERECTOMY  1979    REVIEW OF SYSTEMS:  Constitutional: positive for fatigue Eyes: negative Ears, nose, mouth, throat, and face: negative Respiratory: positive for cough Cardiovascular: negative Gastrointestinal: negative Genitourinary:negative Integument/breast: negative Hematologic/lymphatic: negative Musculoskeletal:positive for arthralgias and muscle weakness Neurological: negative Behavioral/Psych: negative Endocrine: negative Allergic/Immunologic: negative   PHYSICAL EXAMINATION: General appearance: alert, cooperative, fatigued, and no distress Head: Normocephalic, without obvious abnormality, atraumatic Neck: no adenopathy, no JVD, supple, symmetrical, trachea midline, and thyroid not enlarged, symmetric, no  tenderness/mass/nodules Lymph nodes: Cervical, supraclavicular, and axillary nodes normal. Resp: clear to auscultation bilaterally Back: symmetric, no curvature. ROM normal. No CVA tenderness. Cardio: regular rate and rhythm, S1, S2 normal, no murmur, click, rub or gallop GI: soft, non-tender; bowel sounds normal; no masses,  no organomegaly Extremities: extremities normal, atraumatic, no cyanosis or edema Neurologic: Alert and oriented X 3, normal strength and tone. Normal symmetric reflexes. Normal coordination and gait   ECOG PERFORMANCE STATUS: 1 - Symptomatic but completely ambulatory  Blood pressure (!) 157/99, pulse 90, temperature 98.6 F (37 C), temperature source Oral, resp. rate 16, weight 148 lb 1 oz (67.2 kg), SpO2 90 %.  LABORATORY DATA: Lab Results  Component Value Date   WBC 9.5 06/12/2022   HGB 12.7 06/12/2022   HCT 36.6 06/12/2022   MCV 93.4 06/12/2022   PLT 257 06/12/2022      Chemistry      Component Value Date/Time   NA 138 06/12/2022 1007   K 3.7 06/12/2022 1007   CL 103 06/12/2022 1007   CO2 29 06/12/2022 1007   BUN 20 06/12/2022 1007   CREATININE 0.83 06/12/2022 1007  Component Value Date/Time   CALCIUM 9.1 06/12/2022 1007   ALKPHOS 76 06/12/2022 1007   AST 20 06/12/2022 1007   ALT 11 06/12/2022 1007   BILITOT 0.3 06/12/2022 1007       RADIOGRAPHIC STUDIES: No results found.   ASSESSMENT AND PLAN: This is a very pleasant 81 years old white female with extensive stage small cell lung cancer and she is currently undergoing treatment with carboplatin, etoposide and Imfinzi status post 5 cycles.  Starting from cycle #6 the patient is on maintenance treatment with single agent Imfinzi every 4 weeks status post 44 cycles of total treatment. The patient has been tolerating this treatment well with no concerning adverse effects. She had repeat CT scan of the chest, abdomen and pelvis performed few days ago.  Unfortunately the final report is not  available but I personally and independently reviewed the scan images and I do not see any concerning findings for disease progression in the chest but I will wait for the final report for confirmation. I recommended for the patient to proceed with cycle #45 today as planned. I will see her back for follow-up visit in 4 weeks for evaluation before the next cycle of her treatment. For the peripheral neuropathy, she will continue her current treatment with gabapentin. The patient was advised to call immediately if she has any other concerning symptoms in the interval.  The patient voices understanding of current disease status and treatment options and is in agreement with the current care plan.  All questions were answered. The patient knows to call the clinic with any problems, questions or concerns. We can certainly see the patient much sooner if necessary.  Disclaimer: This note was dictated with voice recognition software. Similar sounding words can inadvertently be transcribed and may not be corrected upon review.

## 2022-07-12 ENCOUNTER — Other Ambulatory Visit: Payer: Self-pay

## 2022-07-13 ENCOUNTER — Other Ambulatory Visit: Payer: Self-pay

## 2022-07-13 ENCOUNTER — Telehealth: Payer: Self-pay | Admitting: Internal Medicine

## 2022-07-13 NOTE — Telephone Encounter (Signed)
Called patient regarding upcoming October, November and December. Patient has been called but no voicemail was set up. [facility] will be mailed and will attempt to contact again.

## 2022-08-05 NOTE — Progress Notes (Unsigned)
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  PRIOR THERAPY:  1) Palliative radiotherapy to the left proximal humerus under the care of Dr. Lisbeth Renshaw. 2) Additional palliative radiotherapy to the proximal humerus under the care of Dr. Lisbeth Renshaw. Last treatment on 04/21/2020  CURRENT THERAPY: Palliative systemic chemotherapy with carboplatin for AUC of 5 on day 1, etoposide 100 mg/M2 on days 1, 2 and 3 as well as Imfinzi 1500 mg every 3 weeks with the chemotherapy and Neulasta support. First dose on 03/16/2019. Status post 45 cycles. Starting from cycle #5 the patient will be treated with maintenance treatment with Imfinzi 1500 mg IV every 4 weeks.  INTERVAL HISTORY: Brittany Marks 81 y.o. female returns to the clinic today for a follow-up visit.  The patient is feeling fairly well today without any concerning complaints. At her last appointment 1 month ago, she had a repeat CT scan that was reviewed with Dr. Julien Nordmann that was stable. She denies any changes in her health today. She reports her baseline fatigue. The patient denies any recent fever, chills, or night sweats.  Her weight is stable.  She denies any chest pain or hemoptysis.  She reports her baseline dyspnea on minimal exertion. She reports her mild occasional chronic cough which comes and goes. She denies any chest pain or hemoptysis.  She denies any nausea, vomiting, diarrhea, or constipation. We are monitoring a slightly enlarging lymph node closely on imaging. If this continues to enlarge, we will likely refer to radiation. She denies any headache or visual changes. She denies any rashes or skin changes except for dry  flaky skin. She still has limited mobility in her left shoulder where she previously had radiation and surgery on the metastatic bone lesion in 2020. Marland Kitchen She is here today for evaluation and repeat blood work before starting cycle #46.     MEDICAL HISTORY: Past Medical History:  Diagnosis Date   Cataract    Bilateral   Constipation    pain medication   Left rotator cuff tear    Right rotator cuff tear    SCL CA dx'd 11/2018    ALLERGIES:  is allergic to oxycodone.  MEDICATIONS:  Current Outpatient Medications  Medication Sig Dispense Refill   Ascorbic Acid (VITAMIN C PO) Take by mouth daily.      b complex vitamins tablet Take 1 tablet by mouth daily.     COD LIVER OIL PO Take 1 tablet by mouth daily.      gabapentin (NEURONTIN) 100 MG capsule TAKE 2 CAPSULES(200 MG) BY MOUTH TWICE DAILY 90 capsule 2   hydrocortisone 2.5 % cream Apply topically as needed. (Patient taking differently: Apply 1 application  topically daily as needed (Treatment).) 3.5 g 0   ibuprofen (ADVIL) 200 MG tablet Take 600 mg by mouth daily.     lidocaine-prilocaine (EMLA) cream Apply 1 application topically every 30 (thirty) days.      Multiple Vitamin (MULTIVITAMIN) tablet Take 1 tablet by mouth daily.     ondansetron (ZOFRAN) 4 MG tablet Take 1 tablet (4 mg total) by mouth every 8 (eight) hours as needed for nausea or vomiting. (Patient not taking: Reported on 05/14/2022) 10 tablet 0  oxymetazoline (AFRIN) 0.05 % nasal spray Place 2 sprays into the nose 2 (two) times daily as needed (Bloody nose).  (Patient not taking: Reported on 05/14/2022)     prochlorperazine (COMPAZINE) 10 MG tablet Take 1 tablet (10 mg total) by mouth every 6 (six) hours as needed for nausea or vomiting. (Patient not taking: Reported on 05/14/2022) 30 tablet 0   VITAMIN E PO Take 1 tablet by mouth daily.      No current facility-administered medications for this visit.   Facility-Administered Medications Ordered in Other Visits  Medication  Dose Route Frequency Provider Last Rate Last Admin   heparin lock flush 100 unit/mL  500 Units Intracatheter Once PRN Magrinat, Virgie Dad, MD       sodium chloride flush (NS) 0.9 % injection 10 mL  10 mL Intracatheter PRN Magrinat, Virgie Dad, MD        SURGICAL HISTORY:  Past Surgical History:  Procedure Laterality Date   CATARACT EXTRACTION W/ INTRAOCULAR LENS  IMPLANT, BILATERAL  2012   DECOMPRESSIVE LUMBAR LAMINECTOMY LEVEL 2 N/A 07/29/2013   Procedure: LUMBAR LAMINECTOMY, DECOMPRESSION LUMBAR THREE TO FOUR, FOUR TO FIVE microdiscectomy l3,4 right;  Surgeon: Tobi Bastos, MD;  Location: [facility];  Service: Orthopedics;  Laterality: N/A;   I & D SUPERIOR RIGHT SHOULDER AND CLOSURE WOUND  01-10-2011   S/P ROTATOR CUFF REPAIR   IR IMAGING GUIDED PORT INSERTION  04/09/2019   LUMBAR LAMINECTOMY  1970'S   LUMBAR LAMINECTOMY/DECOMPRESSION MICRODISCECTOMY N/A 06/27/2016   Procedure: L1 - L2 DISCECTOMY;  Surgeon: Melina Schools, MD;  Location: [facility];  Service: Orthopedics;  Laterality: N/A;   LUMBAR SPINE SURGERY  1983   REVERSE SHOULDER ARTHROPLASTY Left 03/10/2020   Procedure: REVERSE SHOULDER ARTHROPLASTY;  Surgeon: Justice Britain, MD;  Location: [facility];  Service: Orthopedics;  Laterality: Left;  183min   RIGHT SHOULDER ARTHROSCOPY/ OPEN DISTAL CLAVICLE RESECTION/ SAD/ OPEN ROTATOR CUFF REPAIR  11-28-2010   SHOULDER FUSION SURGERY  03/11/2020   Shoulder Surgery , hardware placed   SHOULDER OPEN ROTATOR CUFF REPAIR  12/19/2011   Procedure: ROTATOR CUFF REPAIR SHOULDER OPEN;  Surgeon: Magnus Sinning, MD;  Location: [facility];  Service: Orthopedics;  Laterality: Right;  RIGHT RECURRENT OPEN REPAIR OF THE ROTATOR CUFF WITH TISSUE MEND [place] HYSTERECTOMY  1979    REVIEW OF SYSTEMS:   Review of Systems  Constitutional: Negative for appetite change, chills, fatigue, fever and unexpected weight change.  HENT:   Negative for mouth sores, nosebleeds, sore  throat and trouble swallowing.   Eyes: Negative for eye problems and icterus.  Respiratory: Negative for cough, hemoptysis, shortness of breath and wheezing.   Cardiovascular: Negative for chest pain and leg swelling.  Gastrointestinal: Negative for abdominal pain, constipation, diarrhea, nausea and vomiting.  Genitourinary: Negative for bladder incontinence, difficulty urinating, dysuria, frequency and hematuria.   Musculoskeletal: Negative for back pain, gait problem, neck pain and neck stiffness.  Skin: Negative for itching and rash.  Neurological: Negative for dizziness, extremity weakness, gait problem, headaches, light-headedness and seizures.  Hematological: Negative for adenopathy. Does not bruise/bleed easily.  Psychiatric/Behavioral: Negative for confusion, depression and sleep disturbance. The patient is not nervous/anxious.     PHYSICAL EXAMINATION:  There were no vitals taken for this visit.  ECOG PERFORMANCE STATUS: {CHL ONC ECOG [id number]  Physical Exam  Constitutional: Oriented to person, place, and time and well-developed, well-nourished, and in no distress. No distress.  HENT:  Head: Normocephalic and atraumatic.  Mouth/Throat: Oropharynx is clear and moist. No oropharyngeal exudate.  Eyes: Conjunctivae are normal. Right eye exhibits no discharge. Left eye exhibits no discharge. No scleral icterus.  Neck: Normal range of motion. Neck supple.  Cardiovascular: Normal rate, regular rhythm, normal heart sounds and intact distal pulses.   Pulmonary/Chest: Effort normal and breath sounds normal. No respiratory distress. No wheezes. No rales.  Abdominal: Soft. Bowel sounds are normal. Exhibits no distension and no mass. There is no tenderness.  Musculoskeletal: Normal range of motion. Exhibits no edema.  Lymphadenopathy:    No cervical adenopathy.  Neurological: Alert and oriented to person, place, and time. Exhibits normal muscle tone. Gait normal. Coordination normal.   Skin: Skin is warm and dry. No rash noted. Not diaphoretic. No erythema. No pallor.  Psychiatric: Mood, memory and judgment normal.  Vitals reviewed.  LABORATORY DATA: Lab Results  Component Value Date   WBC 9.4 07/09/2022   HGB 12.5 07/09/2022   HCT 36.2 07/09/2022   MCV 94.8 07/09/2022   PLT 247 07/09/2022      Chemistry      Component Value Date/Time   NA 139 07/09/2022 1114   K 3.6 07/09/2022 1114   CL 104 07/09/2022 1114   CO2 29 07/09/2022 1114   BUN 17 07/09/2022 1114   CREATININE 0.68 07/09/2022 1114      Component Value Date/Time   CALCIUM 8.8 (L) 07/09/2022 1114   ALKPHOS 81 07/09/2022 1114   AST 21 07/09/2022 1114   ALT 13 07/09/2022 1114   BILITOT 0.4 07/09/2022 1114       RADIOGRAPHIC STUDIES:  No results found.   ASSESSMENT/PLAN:  This is a very pleasant 81 year old Caucasian female diagnosed with extensive stage (T1b, N3, M1 C) small cell lung cancer.  She presented with right upper lobe pulmonary nodules in addition to bilateral hilar and right mediastinal as well as left supraclavicular and abdominal lymphadenopathy.  She also has metastatic disease to the in the left humerus.  She was diagnosed in May 2020.   She is status post palliative radiotherapy to the left proximal humerus under the care of Dr. Lisbeth Renshaw in 2020.   She is currently undergoing palliative systemic chemotherapy with carboplatin for AUC of 5 on day 1, etoposide 100 mg/M2 on days 1, 2 and 3 as well as Imfinzi 1500 mg every 3 weeks with the chemotherapy and Neulasta support. Status post 45 cycles. Starting from cycle #5 the patient has been treated with maintenance treatment with Imfinzi 1500 mg IV every 4 weeks.   She had a pathological fracture in the left humerus underwent additional radiation to this area. Last treatment was 04/21/20.   Labs were reviewed.  Recommend that she proceed with cycle #46 today scheduled.   We will see her back for follow-up visit in 4 weeks for  evaluation before starting cycle #47  The patient was advised to call immediately if she has any concerning symptoms in the interval. The patient voices understanding of current disease status and treatment options and is in agreement with the current care plan. All questions were answered. The patient knows to call the clinic with any problems, questions or concerns. We can certainly see the patient much sooner if necessary      No orders of the defined types were placed in this encounter.    I spent {CHL ONC TIME VISIT - BPZWC:[id number]} counseling the patient face to face. The total time spent in the appointment was {CHL ONC TIME VISIT - PNTIR:[id number]}.  Ramonica Grigg L Mykeal Carrick, PA-C 08/05/22

## 2022-08-07 ENCOUNTER — Ambulatory Visit: Payer: Federal, State, Local not specified - PPO | Admitting: Internal Medicine

## 2022-08-07 ENCOUNTER — Inpatient Hospital Stay: Payer: Federal, State, Local not specified - PPO

## 2022-08-07 ENCOUNTER — Other Ambulatory Visit: Payer: Self-pay

## 2022-08-07 ENCOUNTER — Other Ambulatory Visit: Payer: Federal, State, Local not specified - PPO

## 2022-08-07 ENCOUNTER — Ambulatory Visit: Payer: Federal, State, Local not specified - PPO

## 2022-08-07 ENCOUNTER — Inpatient Hospital Stay (HOSPITAL_BASED_OUTPATIENT_CLINIC_OR_DEPARTMENT_OTHER): Payer: Federal, State, Local not specified - PPO | Admitting: Physician Assistant

## 2022-08-07 VITALS — BP 155/60 | HR 72 | Resp 18

## 2022-08-07 VITALS — BP 161/71 | HR 73 | Temp 97.5°F | Resp 15 | Wt 148.0 lb

## 2022-08-07 DIAGNOSIS — C3411 Malignant neoplasm of upper lobe, right bronchus or lung: Secondary | ICD-10-CM | POA: Diagnosis not present

## 2022-08-07 DIAGNOSIS — Z5112 Encounter for antineoplastic immunotherapy: Secondary | ICD-10-CM

## 2022-08-07 DIAGNOSIS — G6289 Other specified polyneuropathies: Secondary | ICD-10-CM | POA: Diagnosis not present

## 2022-08-07 DIAGNOSIS — Z95828 Presence of other vascular implants and grafts: Secondary | ICD-10-CM

## 2022-08-07 LAB — CMP (CANCER CENTER ONLY)
ALT: 13 U/L (ref 0–44)
AST: 22 U/L (ref 15–41)
Albumin: 3.8 g/dL (ref 3.5–5.0)
Alkaline Phosphatase: 76 U/L (ref 38–126)
Anion gap: 7 (ref 5–15)
BUN: 16 mg/dL (ref 8–23)
CO2: 26 mmol/L (ref 22–32)
Calcium: 9.5 mg/dL (ref 8.9–10.3)
Chloride: 101 mmol/L (ref 98–111)
Creatinine: 0.76 mg/dL (ref 0.44–1.00)
GFR, Estimated: >60 mL/min (ref >60)
Glucose, Bld: 130 mg/dL — ABNORMAL HIGH (ref 70–99)
Potassium: 4 mmol/L (ref 3.5–5.1)
Sodium: 134 mmol/L — ABNORMAL LOW (ref 135–145)
Total Bilirubin: 0.5 mg/dL (ref 0.3–1.2)
Total Protein: 7.3 g/dL (ref 6.5–8.1)

## 2022-08-07 LAB — CBC WITH DIFFERENTIAL (CANCER CENTER ONLY)
Abs Immature Granulocytes: 0.02 10*3/uL (ref 0.00–0.07)
Basophils Absolute: 0.1 10*3/uL (ref 0.0–0.1)
Basophils Relative: 1 %
Eosinophils Absolute: 0.3 10*3/uL (ref 0.0–0.5)
Eosinophils Relative: 3 %
HCT: 37.3 % (ref 36.0–46.0)
Hemoglobin: 13 g/dL (ref 12.0–15.0)
Immature Granulocytes: 0 %
Lymphocytes Relative: 16 %
Lymphs Abs: 1.4 10*3/uL (ref 0.7–4.0)
MCH: 33.1 pg (ref 26.0–34.0)
MCHC: 34.9 g/dL (ref 30.0–36.0)
MCV: 94.9 fL (ref 80.0–100.0)
Monocytes Absolute: 0.8 10*3/uL (ref 0.1–1.0)
Monocytes Relative: 9 %
Neutro Abs: 6.4 10*3/uL (ref 1.7–7.7)
Neutrophils Relative %: 71 %
Platelet Count: 248 10*3/uL (ref 150–400)
RBC: 3.93 MIL/uL (ref 3.87–5.11)
RDW: 13.6 % (ref 11.5–15.5)
WBC Count: 9 10*3/uL (ref 4.0–10.5)
nRBC: 0 % (ref 0.0–0.2)

## 2022-08-07 MED ORDER — SODIUM CHLORIDE 0.9% FLUSH
10.0000 mL | INTRAVENOUS | Status: DC | PRN
  Administered 2022-08-07: 10 mL

## 2022-08-07 MED ORDER — ALBUTEROL SULFATE HFA 108 (90 BASE) MCG/ACT IN AERS: 2.0000 | INHALATION_SPRAY | Freq: Four times a day (QID) | RESPIRATORY_TRACT | 2 refills | Status: DC | PRN

## 2022-08-07 MED ORDER — GABAPENTIN 100 MG PO CAPS: 100.0000 mg | ORAL_CAPSULE | Freq: Two times a day (BID) | ORAL | 2 refills | Status: DC

## 2022-08-07 MED ORDER — SODIUM CHLORIDE 0.9 % IV SOLN
1500.0000 mg | Freq: Once | INTRAVENOUS | Status: AC
  Administered 2022-08-07: 1500 mg via INTRAVENOUS
  Filled 2022-08-07: qty 30

## 2022-08-07 MED ORDER — SODIUM CHLORIDE 0.9 % IV SOLN: Freq: Once | INTRAVENOUS | Status: AC

## 2022-08-07 MED ORDER — HEPARIN SOD (PORK) LOCK FLUSH 100 UNIT/ML IV SOLN
500.0000 [IU] | Freq: Once | INTRAVENOUS | Status: AC | PRN
  Administered 2022-08-07: 500 [IU]

## 2022-08-07 MED ORDER — LIDOCAINE-PRILOCAINE 2.5-2.5 % EX CREA: 1.0000 | TOPICAL_CREAM | CUTANEOUS | 2 refills | Status: DC

## 2022-08-07 NOTE — Patient Instructions (Signed)
[facility] MEDICAL ONCOLOGY  Discharge Instructions: Thank you for choosing [facility] to provide your oncology and hematology care.   If you have a lab appointment with the Cancer Center, please go directly to the Cancer Center and check in at the registration area.   Wear comfortable clothing and clothing appropriate for easy access to any Portacath or PICC line.   We strive to give you quality time with your provider. You may need to reschedule your appointment if you arrive late (15 or more minutes).  Arriving late affects you and other patients whose appointments are after yours.  Also, if you miss three or more appointments without notifying the office, you may be dismissed from the clinic at the provider's discretion.      For prescription refill requests, have your pharmacy contact our office and allow 72 hours for refills to be completed.    Today you received the following chemotherapy and/or immunotherapy agents; Imfinzi      To help prevent nausea and vomiting after your treatment, we encourage you to take your nausea medication as directed.  BELOW ARE SYMPTOMS THAT SHOULD BE REPORTED IMMEDIATELY: *FEVER GREATER THAN 100.4 F (38 C) OR HIGHER *CHILLS OR SWEATING *NAUSEA AND VOMITING THAT IS NOT CONTROLLED WITH YOUR NAUSEA MEDICATION *UNUSUAL SHORTNESS OF BREATH *UNUSUAL BRUISING OR BLEEDING *URINARY PROBLEMS (pain or burning when urinating, or frequent urination) *BOWEL PROBLEMS (unusual diarrhea, constipation, pain near the anus) TENDERNESS IN MOUTH AND THROAT WITH OR WITHOUT PRESENCE OF ULCERS (sore throat, sores in mouth, or a toothache) UNUSUAL RASH, SWELLING OR PAIN  UNUSUAL VAGINAL DISCHARGE OR ITCHING   Items with * indicate a potential emergency and should be followed up as soon as possible or go to the Emergency Department if any problems should occur.  Please show the CHEMOTHERAPY ALERT CARD or IMMUNOTHERAPY ALERT CARD at check-in to  the Emergency Department and triage nurse.  Should you have questions after your visit or need to cancel or reschedule your appointment, please contact [facility]ICAL [facility]COL[facility]  Dept: [phone]  and follow the prompts.  Office hours are 8:00 a.m. to 4:30 p.m. Monday - Friday. Please note that voicemails left after 4:00 p.m. may not be returned until the following business day.  We are closed weekends and major holidays. You have access to a nurse at all times for urgent questions. Please call the main number to the clinic Dept: [phone] and follow the prompts.   For any non-urgent questions, you may also contact your provider using MyChart. We now offer e-Visits for anyone 18 and older to request care online for non-urgent symptoms. For details visit [email or web address].   Also download the MyChart app! Go to the app store, search "MyChart", open the app, select [facility], and log in with your MyChart username and password.  Masks are optional in the cancer centers. If you would like for your care team to wear a mask while they are taking care of you, please let them know. You may have one support person who is at least 81 years old accompany you for your appointments. 

## 2022-08-09 ENCOUNTER — Other Ambulatory Visit: Payer: Self-pay

## 2022-08-14 ENCOUNTER — Other Ambulatory Visit: Payer: Self-pay

## 2022-08-23 ENCOUNTER — Telehealth: Payer: Self-pay | Admitting: Physician Assistant

## 2022-08-23 NOTE — Telephone Encounter (Signed)
Rescheduled 11/28 and 12/26 appointments due to provider pal. Patient has been called and notified of new upcoming appointments.

## 2022-08-24 NOTE — Progress Notes (Signed)
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   PRIOR THERAPY: 1) Palliative radiotherapy to the left proximal humerus under the care of Dr. Lisbeth Renshaw. 2) Additional palliative radiotherapy to the proximal humerus under the care of Dr. Lisbeth Renshaw. Last treatment on 04/21/2020  CURRENT THERAPY: Palliative systemic chemotherapy with carboplatin for AUC of 5 on day 1, etoposide 100 mg/M2 on days 1, 2 and 3 as well as Imfinzi 1500 mg every 3 weeks with the chemotherapy and Neulasta support. First dose on 03/16/2019. Status post 46 cycles. Starting from cycle #5 the patient will be treated with maintenance treatment with Imfinzi 1500 mg IV every 4 weeks.   INTERVAL HISTORY: Brittany Marks 81 y.o. female returns to the clinic today for a follow-up visit.  The patient is feeling fine today without any concerning complaints. She denies any changes in her health today. She reports her baseline fatigue, worsened since her family visited for Thanksgiving. The patient denies any recent fever, chills, or night sweats, or weight loss. She reports her baseline dyspnea. She reports her chronic cough which is stable. She does not have a PCP. She denies any chest pain or hemoptysis.  She denies any nausea, vomiting, diarrhea, or constipation. She reports a mild headache today which is unusual for her, she denies any visual changes. She denies any rashes.   We are monitoring a slightly enlarging lymph node closely on imaging. If this continues to enlarge, we will likely refer to radiation.   She still has limited mobility in her left shoulder where she previously had radiation and surgery on the  metastatic bone lesion in 2020.   She is here today for evaluation and repeat blood work before starting cycle #47      MEDICAL HISTORY: Past Medical History:  Diagnosis Date   Cataract    Bilateral   Constipation    pain medication   Left rotator cuff tear    Right rotator cuff tear    SCL CA dx'd 11/2018    ALLERGIES:  is allergic to oxycodone.  MEDICATIONS:  Current Outpatient Medications  Medication Sig Dispense Refill   albuterol (VENTOLIN HFA) 108 (90 Base) MCG/ACT inhaler Inhale 2 puffs into the lungs every 6 (six) hours as needed for wheezing or shortness of breath. 8 g 2   Ascorbic Acid (VITAMIN C PO) Take by mouth daily.      b complex vitamins tablet Take 1 tablet by mouth daily.     COD LIVER OIL PO Take 1 tablet by mouth daily.      gabapentin (NEURONTIN) 100 MG capsule Take 1 capsule (100 mg total) by mouth 2 (two) times daily. 90 capsule 2   hydrocortisone 2.5 % cream Apply topically as needed. (Patient taking differently: Apply 1 application  topically daily as needed (Treatment).) 3.5 g 0   ibuprofen (ADVIL) 200 MG tablet Take 600 mg by mouth daily.     lidocaine-prilocaine (EMLA) cream Apply 1 Application topically every 30 (thirty) days. 30 g 2   Multiple Vitamin (MULTIVITAMIN) tablet Take 1 tablet by mouth daily.     ondansetron (ZOFRAN) 4 MG tablet Take 1 tablet (4 mg total) by mouth every 8 (eight) hours  as needed for nausea or vomiting. 10 tablet 0   oxymetazoline (AFRIN) 0.05 % nasal spray Place 2 sprays into the nose 2 (two) times daily as needed (Bloody nose).     prochlorperazine (COMPAZINE) 10 MG tablet Take 1 tablet (10 mg total) by mouth every 6 (six) hours as needed for nausea or vomiting. 30 tablet 0   VITAMIN E PO Take 1 tablet by mouth daily.      No current facility-administered medications for this visit.   Facility-Administered Medications Ordered in Other Visits  Medication Dose Route Frequency Provider Last Rate Last Admin   heparin lock  flush 100 unit/mL  500 Units Intracatheter Once PRN Magrinat, Virgie Dad, MD       sodium chloride flush (NS) 0.9 % injection 10 mL  10 mL Intracatheter PRN Magrinat, Virgie Dad, MD        SURGICAL HISTORY:  Past Surgical History:  Procedure Laterality Date   CATARACT EXTRACTION W/ INTRAOCULAR LENS  IMPLANT, BILATERAL  2012   DECOMPRESSIVE LUMBAR LAMINECTOMY LEVEL 2 N/A 07/29/2013   Procedure: LUMBAR LAMINECTOMY, DECOMPRESSION LUMBAR THREE TO FOUR, FOUR TO FIVE microdiscectomy l3,4 right;  Surgeon: Tobi Bastos, MD;  Location: [facility];  Service: Orthopedics;  Laterality: N/A;   I & D SUPERIOR RIGHT SHOULDER AND CLOSURE WOUND  01-10-2011   S/P ROTATOR CUFF REPAIR   IR IMAGING GUIDED PORT INSERTION  04/09/2019   LUMBAR LAMINECTOMY  1970'S   LUMBAR LAMINECTOMY/DECOMPRESSION MICRODISCECTOMY N/A 06/27/2016   Procedure: L1 - L2 DISCECTOMY;  Surgeon: Melina Schools, MD;  Location: [facility];  Service: Orthopedics;  Laterality: N/A;   LUMBAR SPINE SURGERY  1983   REVERSE SHOULDER ARTHROPLASTY Left 03/10/2020   Procedure: REVERSE SHOULDER ARTHROPLASTY;  Surgeon: Justice Britain, MD;  Location: [facility];  Service: Orthopedics;  Laterality: Left;  145min   RIGHT SHOULDER ARTHROSCOPY/ OPEN DISTAL CLAVICLE RESECTION/ SAD/ OPEN ROTATOR CUFF REPAIR  11-28-2010   SHOULDER FUSION SURGERY  03/11/2020   Shoulder Surgery , hardware placed   SHOULDER OPEN ROTATOR CUFF REPAIR  12/19/2011   Procedure: ROTATOR CUFF REPAIR SHOULDER OPEN;  Surgeon: Magnus Sinning, MD;  Location: [facility];  Service: Orthopedics;  Laterality: Right;  RIGHT RECURRENT OPEN REPAIR OF THE ROTATOR CUFF WITH TISSUE MEND GRAFTANTERIOR CHROMIOECTOMY   VAGINAL HYSTERECTOMY  1979    REVIEW OF SYSTEMS:   Constitutional: Positive for fatigue.  Negative for appetite change, chills, fever and unexpected weight change.  HENT: Negative for mouth sores, nosebleeds, sore throat and trouble swallowing.   Eyes: Negative for eye problems and  icterus.  Respiratory: Positive for stable dyspnea on exertion and chronic cough.  Negative for hemoptysis and wheezing.   Cardiovascular: Negative for chest pain and leg swelling.  Gastrointestinal: Negative for abdominal pain, recent diarrhea, constipation, diarrhea, nausea and vomiting.  Genitourinary: Negative for bladder incontinence, difficulty urinating, dysuria, frequency and hematuria.   Musculoskeletal: Positive for chronic back pain. Negative for gait problem, neck pain and neck stiffness.  Skin: Negative for itching and rash.  Neurological: Positive for peripheral neuropathy. Negative for dizziness, extremity weakness, gait problem, headaches, light-headedness and seizures.  Hematological: Negative for adenopathy. Does not bruise/bleed easily.  Psychiatric/Behavioral: Negative for confusion, depression and sleep disturbance. The patient is not nervous/anxious.    PHYSICAL EXAMINATION:  Blood pressure (!) 145/90, pulse 79, temperature 98.2 F (36.8 C), temperature source Oral, resp. rate 15, weight 149 lb 1.6 oz (67.6 kg), SpO2 91 %.  ECOG PERFORMANCE STATUS: 2-3  Physical  Exam  Constitutional: Oriented to person, place, and time and chronically ill appearing female and in no distress.  HENT:  Head: Normocephalic and atraumatic.  Mouth/Throat: Oropharynx is clear and moist. No oropharyngeal exudate.  Eyes: Conjunctivae are normal. Right eye exhibits no discharge. Left eye exhibits no discharge. No scleral icterus.  Neck: Normal range of motion. Neck supple.  Cardiovascular: Normal rate, regular rhythm, normal heart sounds.   Pulmonary/Chest: Effort normal and breath sounds normal. No respiratory distress. Wheezes bilaterally in lung bases. Crackles  Abdominal: Soft. Exhibits no distension and no mass. There is no tenderness.  Musculoskeletal: Normal range of motion. Exhibits no edema.  Lymphadenopathy:    No cervical adenopathy.  Neurological: Alert and oriented to person,  place, and time. Exhibits muscle wasting. Examined in the wheelchair.  Skin: Skin is warm and dry. No rash noted. Not diaphoretic. No erythema. No pallor.  Psychiatric: Mood, memory and judgment normal.  Vitals reviewed.  LABORATORY DATA: Lab Results  Component Value Date   WBC 8.9 09/03/2022   HGB 12.9 09/03/2022   HCT 37.7 09/03/2022   MCV 96.4 09/03/2022   PLT 247 09/03/2022      Chemistry      Component Value Date/Time   NA 136 09/03/2022 1412   K 4.0 09/03/2022 1412   CL 103 09/03/2022 1412   CO2 27 09/03/2022 1412   BUN 23 09/03/2022 1412   CREATININE 0.81 09/03/2022 1412      Component Value Date/Time   CALCIUM 9.7 09/03/2022 1412   ALKPHOS 73 09/03/2022 1412   AST 19 09/03/2022 1412   ALT 13 09/03/2022 1412   BILITOT 0.4 09/03/2022 1412       RADIOGRAPHIC STUDIES:  No results found.   ASSESSMENT/PLAN:  This is a very pleasant 81 year old Caucasian female diagnosed with extensive stage (T1b, N3, M1 C) small cell lung cancer.  She presented with right upper lobe pulmonary nodules in addition to bilateral hilar and right mediastinal as well as left supraclavicular and abdominal lymphadenopathy.  She also has metastatic disease to the in the left humerus.  She was diagnosed in May 2020.   She is status post palliative radiotherapy to the left proximal humerus under the care of Dr. Lisbeth Renshaw in 2020.   She is currently undergoing palliative systemic chemotherapy with carboplatin for AUC of 5 on day 1, etoposide 100 mg/M2 on days 1, 2 and 3 as well as Imfinzi 1500 mg every 3 weeks with the chemotherapy and Neulasta support. Status post 46 cycles. Starting from cycle #5 the patient has been treated with maintenance treatment with Imfinzi 1500 mg IV every 4 weeks.  She had a pathological fracture in the left humerus underwent additional radiation to this area. Last treatment was 04/21/20.   Labs were reviewed.  Recommend that she proceed with cycle #47 today scheduled.    We will see her back for follow-up visit in 4 weeks for evaluation before starting cycle #48   Her last CT scan in September, the patient had an enlarging lymph node which we are watching closely.  I will arrange for restaging CT scan of the chest, abdomen, and pelvis prior to her next appointment.  If this area continues to enlarge, we will consider radiation to this area.  The patient was advised to call immediately if she has any concerning symptoms in the interval. The patient voices understanding of current disease status and treatment options and is in agreement with the current care plan. All questions were answered.  The patient knows to call the clinic with any problems, questions or concerns. We can certainly see the patient much sooner if necessary       Orders Placed This Encounter  Procedures   CT Chest W Contrast    Standing Status:   Future    Standing Expiration Date:   09/03/2023    Order Specific Question:   If indicated for the ordered procedure, I authorize the administration of contrast media per Radiology protocol    Answer:   Yes    Order Specific Question:   Does the patient have a contrast media/X-ray dye allergy?    Answer:   No    Order Specific Question:   Preferred imaging location?    Answer:   [facility]   CT Abdomen Pelvis W Contrast    Standing Status:   Future    Standing Expiration Date:   09/03/2023    Order Specific Question:   If indicated for the ordered procedure, I authorize the administration of contrast media per Radiology protocol    Answer:   Yes    Order Specific Question:   Does the patient have a contrast media/X-ray dye allergy?    Answer:   No    Order Specific Question:   Preferred imaging location?    Answer:   [facility]    Order Specific Question:   Is Oral Contrast requested for this exam?    Answer:   Yes, Per Radiology protocol     The total time spent in the appointment was 20-29 minutes.   Romell Cavanah  L Jalynn Betzold, PA-C 09/03/22

## 2022-08-29 ENCOUNTER — Other Ambulatory Visit: Payer: Self-pay

## 2022-09-03 ENCOUNTER — Ambulatory Visit: Payer: Federal, State, Local not specified - PPO

## 2022-09-03 ENCOUNTER — Inpatient Hospital Stay: Payer: Federal, State, Local not specified - PPO | Attending: Oncology

## 2022-09-03 ENCOUNTER — Inpatient Hospital Stay: Payer: Federal, State, Local not specified - PPO

## 2022-09-03 ENCOUNTER — Inpatient Hospital Stay (HOSPITAL_BASED_OUTPATIENT_CLINIC_OR_DEPARTMENT_OTHER): Payer: Federal, State, Local not specified - PPO | Admitting: Physician Assistant

## 2022-09-03 ENCOUNTER — Other Ambulatory Visit: Payer: Federal, State, Local not specified - PPO

## 2022-09-03 ENCOUNTER — Ambulatory Visit: Payer: Federal, State, Local not specified - PPO | Admitting: Physician Assistant

## 2022-09-03 VITALS — BP 145/90 | HR 79 | Temp 98.2°F | Resp 15 | Wt 149.1 lb

## 2022-09-03 DIAGNOSIS — Z95828 Presence of other vascular implants and grafts: Secondary | ICD-10-CM

## 2022-09-03 DIAGNOSIS — Z23 Encounter for immunization: Secondary | ICD-10-CM | POA: Insufficient documentation

## 2022-09-03 DIAGNOSIS — Z923 Personal history of irradiation: Secondary | ICD-10-CM | POA: Diagnosis not present

## 2022-09-03 DIAGNOSIS — K5900 Constipation, unspecified: Secondary | ICD-10-CM | POA: Insufficient documentation

## 2022-09-03 DIAGNOSIS — Z5112 Encounter for antineoplastic immunotherapy: Secondary | ICD-10-CM

## 2022-09-03 DIAGNOSIS — Z79899 Other long term (current) drug therapy: Secondary | ICD-10-CM | POA: Insufficient documentation

## 2022-09-03 DIAGNOSIS — Z9221 Personal history of antineoplastic chemotherapy: Secondary | ICD-10-CM | POA: Insufficient documentation

## 2022-09-03 DIAGNOSIS — C3411 Malignant neoplasm of upper lobe, right bronchus or lung: Secondary | ICD-10-CM | POA: Insufficient documentation

## 2022-09-03 DIAGNOSIS — R590 Localized enlarged lymph nodes: Secondary | ICD-10-CM | POA: Diagnosis not present

## 2022-09-03 DIAGNOSIS — Z791 Long term (current) use of non-steroidal anti-inflammatories (NSAID): Secondary | ICD-10-CM | POA: Diagnosis not present

## 2022-09-03 DIAGNOSIS — C7951 Secondary malignant neoplasm of bone: Secondary | ICD-10-CM | POA: Diagnosis not present

## 2022-09-03 LAB — CMP (CANCER CENTER ONLY)
ALT: 13 U/L (ref 0–44)
AST: 19 U/L (ref 15–41)
Albumin: 4.1 g/dL (ref 3.5–5.0)
Alkaline Phosphatase: 73 U/L (ref 38–126)
Anion gap: 6 (ref 5–15)
BUN: 23 mg/dL (ref 8–23)
CO2: 27 mmol/L (ref 22–32)
Calcium: 9.7 mg/dL (ref 8.9–10.3)
Chloride: 103 mmol/L (ref 98–111)
Creatinine: 0.81 mg/dL (ref 0.44–1.00)
GFR, Estimated: >60 mL/min (ref >60)
Glucose, Bld: 122 mg/dL — ABNORMAL HIGH (ref 70–99)
Potassium: 4 mmol/L (ref 3.5–5.1)
Sodium: 136 mmol/L (ref 135–145)
Total Bilirubin: 0.4 mg/dL (ref 0.3–1.2)
Total Protein: 7.4 g/dL (ref 6.5–8.1)

## 2022-09-03 LAB — CBC WITH DIFFERENTIAL (CANCER CENTER ONLY)
Abs Immature Granulocytes: 0.03 10*3/uL (ref 0.00–0.07)
Basophils Absolute: 0.1 10*3/uL (ref 0.0–0.1)
Basophils Relative: 1 %
Eosinophils Absolute: 0.3 10*3/uL (ref 0.0–0.5)
Eosinophils Relative: 3 %
HCT: 37.7 % (ref 36.0–46.0)
Hemoglobin: 12.9 g/dL (ref 12.0–15.0)
Immature Granulocytes: 0 %
Lymphocytes Relative: 14 %
Lymphs Abs: 1.3 10*3/uL (ref 0.7–4.0)
MCH: 33 pg (ref 26.0–34.0)
MCHC: 34.2 g/dL (ref 30.0–36.0)
MCV: 96.4 fL (ref 80.0–100.0)
Monocytes Absolute: 0.7 10*3/uL (ref 0.1–1.0)
Monocytes Relative: 8 %
Neutro Abs: 6.6 10*3/uL (ref 1.7–7.7)
Neutrophils Relative %: 74 %
Platelet Count: 247 10*3/uL (ref 150–400)
RBC: 3.91 MIL/uL (ref 3.87–5.11)
RDW: 14.1 % (ref 11.5–15.5)
WBC Count: 8.9 10*3/uL (ref 4.0–10.5)
nRBC: 0 % (ref 0.0–0.2)

## 2022-09-03 MED ORDER — INFLUENZA VAC A&B SA ADJ QUAD 0.5 ML IM PRSY
0.5000 mL | PREFILLED_SYRINGE | Freq: Once | INTRAMUSCULAR | Status: AC
  Administered 2022-09-03: 0.5 mL via INTRAMUSCULAR
  Filled 2022-09-03: qty 0.5

## 2022-09-03 MED ORDER — HEPARIN SOD (PORK) LOCK FLUSH 100 UNIT/ML IV SOLN
500.0000 [IU] | Freq: Once | INTRAVENOUS | Status: AC | PRN
  Administered 2022-09-03: 500 [IU]

## 2022-09-03 MED ORDER — SODIUM CHLORIDE 0.9% FLUSH
10.0000 mL | INTRAVENOUS | Status: DC | PRN
  Administered 2022-09-03: 10 mL

## 2022-09-03 MED ORDER — SODIUM CHLORIDE 0.9 % IV SOLN
1500.0000 mg | Freq: Once | INTRAVENOUS | Status: AC
  Administered 2022-09-03: 1500 mg via INTRAVENOUS
  Filled 2022-09-03: qty 30

## 2022-09-03 MED ORDER — SODIUM CHLORIDE 0.9 % IV SOLN: Freq: Once | INTRAVENOUS | Status: AC

## 2022-09-03 NOTE — Patient Instructions (Signed)
[facility] MEDICAL [facility]COLOGY  Discharge Instructions: Thank you for choosing [facility] to provide your oncology and hematology care.   If you have a lab appointment with the Cancer Center, please go directly to the Cancer Center and check in at the registration area.   Wear comfortable clothing and clothing appropriate for easy access to any Portacath or PICC line.   We strive to give you quality time with your provider. You may need to reschedule your appointment if you arrive late (15 or more minutes).  Arriving late affects you and other patients whose appointments are after yours.  Also, if you miss three or more appointments without notifying the office, you may be dismissed from the clinic at the provider's discretion.      For prescription refill requests, have your pharmacy contact our office and allow 72 hours for refills to be completed.    Today you received the following chemotherapy and/or immunotherapy agents; Imfinzi      To help prevent nausea and vomiting after your treatment, we encourage you to take your nausea medication as directed.  BELOW ARE SYMPTOMS THAT SHOULD BE REPORTED IMMEDIATELY: *FEVER GREATER THAN 100.4 F (38 C) OR HIGHER *CHILLS OR SWEATING *NAUSEA AND VOMITING THAT IS NOT CONTROLLED WITH YOUR NAUSEA MEDICATION *UNUSUAL SHORTNESS OF BREATH *UNUSUAL BRUISING OR BLEEDING *URINARY PROBLEMS (pain or burning when urinating, or frequent urination) *BOWEL PROBLEMS (unusual diarrhea, constipation, pain near the anus) TENDERNESS IN MOUTH AND THROAT WITH OR WITHOUT PRESENCE OF ULCERS (sore throat, sores in mouth, or a toothache) UNUSUAL RASH, SWELLING OR PAIN  UNUSUAL VAGINAL DISCHARGE OR ITCHING   Items with * indicate a potential emergency and should be followed up as soon as possible or go to the Emergency Department if any problems should occur.  Please show the CHEMOTHERAPY ALERT CARD or IMMUNOTHERAPY ALERT CARD at check-in to  the Emergency Department and triage nurse.  Should you have questions after your visit or need to cancel or reschedule your appointment, please contact [facility]  Dept: [phone]  and follow the prompts.  Office hours are 8:00 a.m. to 4:30 p.m. Monday - Friday. Please note that voicemails left after 4:00 p.m. may not be returned until the following business day.  We are closed weekends and major holidays. You have access to a nurse at all times for urgent questions. Please call the main number to the clinic Dept: [phone] and follow the prompts.   For any non-urgent questions, you may also contact your provider using MyChart. We now offer e-Visits for anyone 18 and older to request care online for non-urgent symptoms. For details visit [email or web address].   Also download the MyChart app! Go to the app store, search "MyChart", open the app, select [facility], and log in with your MyChart username and password.  Masks are optional in the cancer centers. If you would like for your care team to wear a mask while they are taking care of you, please let them know. You may have one support person who is at least 81 years old accompany you for your appointments. 

## 2022-09-04 ENCOUNTER — Inpatient Hospital Stay: Payer: Federal, State, Local not specified - PPO

## 2022-09-04 ENCOUNTER — Ambulatory Visit: Payer: Federal, State, Local not specified - PPO

## 2022-09-04 ENCOUNTER — Inpatient Hospital Stay: Payer: Federal, State, Local not specified - PPO | Admitting: Internal Medicine

## 2022-09-30 NOTE — Progress Notes (Unsigned)
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    PRIOR THERAPY: 1) Palliative radiotherapy to the left proximal humerus under the care of Dr. Lisbeth Renshaw. 2) Additional palliative radiotherapy to the proximal humerus under the care of Dr. Lisbeth Renshaw. Last treatment on 04/21/2020  CURRENT THERAPY:  on day 1, etoposide 100 mg/M2 on days 1, 2 and 3 as well as Imfinzi 1500 mg every 3 weeks with the chemotherapy and Neulasta support. First dose on 03/16/2019. Status post 47 cycles. Starting from cycle #5 the patient will be treated with maintenance treatment with Imfinzi 1500 mg IV every 4 weeks.    INTERVAL HISTORY: JOLIANA CLAFLIN 81 y.o. female returns to the clinic today for a follow-up visit.  The patient is feeling fine today without any concerning complaints. She denies any changes in her health today. She reports her baseline fatigue. The patient denies any recent fever, chills, or night sweats, or weight loss. She reports her baseline dyspnea. She reports her chronic cough which is stable. She does not have a PCP. She denies any chest pain or hemoptysis.  She denies any nausea, vomiting, diarrhea, or constipation. She reports a mild headache today which is unusual for her, she denies any visual changes. She denies any rashes.  We are monitoring a slightly enlarging lymph node closely on imaging. If this continues to enlarge, we will likely refer to radiation. She was supposed to have a restaging CT scan on 12/19; however, this has not been scheduled until 1/19.Marland Kitchendue to to ****???? She still has limited mobility in her left shoulder where she previously had radiation and surgery on the  metastatic bone lesion in 2020.    She is here today for evaluation and repeat blood work before starting cycle #48      MEDICAL HISTORY: Past Medical History:  Diagnosis Date   Cataract    Bilateral   Constipation    pain medication   Left rotator cuff tear    Right rotator cuff tear    SCL CA dx'd 11/2018    ALLERGIES:  is allergic to oxycodone.  MEDICATIONS:  Current Outpatient Medications  Medication Sig Dispense Refill   albuterol (VENTOLIN HFA) 108 (90 Base) MCG/ACT inhaler Inhale 2 puffs into the lungs every 6 (six) hours as needed for wheezing or shortness of breath. 8 g 2   Ascorbic Acid (VITAMIN C PO) Take by mouth daily.      b complex vitamins tablet Take 1 tablet by mouth daily.     COD LIVER OIL PO Take 1 tablet by mouth daily.      gabapentin (NEURONTIN) 100 MG capsule Take 1 capsule (100 mg total) by mouth 2 (two) times daily. 90 capsule 2   hydrocortisone 2.5 % cream Apply topically as needed. (Patient taking differently: Apply 1 application  topically daily as needed (Treatment).) 3.5 g 0   ibuprofen (ADVIL) 200 MG tablet Take 600 mg by mouth daily.     lidocaine-prilocaine (EMLA) cream Apply 1 Application topically every 30 (thirty) days. 30 g 2   Multiple Vitamin (MULTIVITAMIN) tablet Take 1 tablet by mouth daily.     ondansetron (ZOFRAN) 4 MG tablet Take 1 tablet (4 mg  total) by mouth every 8 (eight) hours as needed for nausea or vomiting. 10 tablet 0   oxymetazoline (AFRIN) 0.05 % nasal spray Place 2 sprays into the nose 2 (two) times daily as needed (Bloody nose).     prochlorperazine (COMPAZINE) 10 MG tablet Take 1 tablet (10 mg total) by mouth every 6 (six) hours as needed for nausea or vomiting. 30 tablet 0   VITAMIN E PO Take 1 tablet by mouth daily.      No current facility-administered medications for this visit.   Facility-Administered Medications Ordered in Other Visits  Medication Dose Route Frequency Provider Last Rate Last Admin   heparin lock  flush 100 unit/mL  500 Units Intracatheter Once PRN Magrinat, Virgie Dad, MD       sodium chloride flush (NS) 0.9 % injection 10 mL  10 mL Intracatheter PRN Magrinat, Virgie Dad, MD        SURGICAL HISTORY:  Past Surgical History:  Procedure Laterality Date   CATARACT EXTRACTION W/ INTRAOCULAR LENS  IMPLANT, BILATERAL  2012   DECOMPRESSIVE LUMBAR LAMINECTOMY LEVEL 2 N/A 07/29/2013   Procedure: LUMBAR LAMINECTOMY, DECOMPRESSION LUMBAR THREE TO FOUR, FOUR TO FIVE microdiscectomy l3,4 right;  Surgeon: Tobi Bastos, MD;  Location: [facility];  Service: Orthopedics;  Laterality: N/A;   I & D SUPERIOR RIGHT SHOULDER AND CLOSURE WOUND  01-10-2011   S/P ROTATOR CUFF REPAIR   IR IMAGING GUIDED PORT INSERTION  04/09/2019   LUMBAR LAMINECTOMY  1970'S   LUMBAR LAMINECTOMY/DECOMPRESSION MICRODISCECTOMY N/A 06/27/2016   Procedure: L1 - L2 DISCECTOMY;  Surgeon: Melina Schools, MD;  Location: [facility];  Service: Orthopedics;  Laterality: N/A;   LUMBAR SPINE SURGERY  1983   REVERSE SHOULDER ARTHROPLASTY Left 03/10/2020   Procedure: REVERSE SHOULDER ARTHROPLASTY;  Surgeon: Justice Britain, MD;  Location: [facility];  Service: Orthopedics;  Laterality: Left;  150min   RIGHT SHOULDER ARTHROSCOPY/ OPEN DISTAL CLAVICLE RESECTION/ SAD/ OPEN ROTATOR CUFF REPAIR  11-28-2010   SHOULDER FUSION SURGERY  03/11/2020   Shoulder Surgery , hardware placed   SHOULDER OPEN ROTATOR CUFF REPAIR  12/19/2011   Procedure: ROTATOR CUFF REPAIR SHOULDER OPEN;  Surgeon: Magnus Sinning, MD;  Location: [facility];  Service: Orthopedics;  Laterality: Right;  RIGHT RECURRENT OPEN REPAIR OF THE ROTATOR CUFF WITH TISSUE MEND [place] HYSTERECTOMY  1979    REVIEW OF SYSTEMS:   Review of Systems  Constitutional: Negative for appetite change, chills, fatigue, fever and unexpected weight change.  HENT:   Negative for mouth sores, nosebleeds, sore throat and trouble swallowing.   Eyes: Negative for eye problems  and icterus.  Respiratory: Negative for cough, hemoptysis, shortness of breath and wheezing.   Cardiovascular: Negative for chest pain and leg swelling.  Gastrointestinal: Negative for abdominal pain, constipation, diarrhea, nausea and vomiting.  Genitourinary: Negative for bladder incontinence, difficulty urinating, dysuria, frequency and hematuria.   Musculoskeletal: Negative for back pain, gait problem, neck pain and neck stiffness.  Skin: Negative for itching and rash.  Neurological: Negative for dizziness, extremity weakness, gait problem, headaches, light-headedness and seizures.  Hematological: Negative for adenopathy. Does not bruise/bleed easily.  Psychiatric/Behavioral: Negative for confusion, depression and sleep disturbance. The patient is not nervous/anxious.     PHYSICAL EXAMINATION:  There were no vitals taken for this visit.  ECOG PERFORMANCE STATUS: {CHL ONC ECOG [id number]  Physical Exam  Constitutional: Oriented to person, place, and time and well-developed, well-nourished, and in no distress. No distress.  HENT:  Head: Normocephalic and atraumatic.  Mouth/Throat: Oropharynx is clear and moist. No oropharyngeal exudate.  Eyes: Conjunctivae are normal. Right eye exhibits no discharge. Left eye exhibits no discharge. No scleral icterus.  Neck: Normal range of motion. Neck supple.  Cardiovascular: Normal rate, regular rhythm, normal heart sounds and intact distal pulses.   Pulmonary/Chest: Effort normal and breath sounds normal. No respiratory distress. No wheezes. No rales.  Abdominal: Soft. Bowel sounds are normal. Exhibits no distension and no mass. There is no tenderness.  Musculoskeletal: Normal range of motion. Exhibits no edema.  Lymphadenopathy:    No cervical adenopathy.  Neurological: Alert and oriented to person, place, and time. Exhibits normal muscle tone. Gait normal. Coordination normal.  Skin: Skin is warm and dry. No rash noted. Not diaphoretic. No  erythema. No pallor.  Psychiatric: Mood, memory and judgment normal.  Vitals reviewed.  LABORATORY DATA: Lab Results  Component Value Date   WBC 8.9 09/03/2022   HGB 12.9 09/03/2022   HCT 37.7 09/03/2022   MCV 96.4 09/03/2022   PLT 247 09/03/2022      Chemistry      Component Value Date/Time   NA 136 09/03/2022 1412   K 4.0 09/03/2022 1412   CL 103 09/03/2022 1412   CO2 27 09/03/2022 1412   BUN 23 09/03/2022 1412   CREATININE 0.81 09/03/2022 1412      Component Value Date/Time   CALCIUM 9.7 09/03/2022 1412   ALKPHOS 73 09/03/2022 1412   AST 19 09/03/2022 1412   ALT 13 09/03/2022 1412   BILITOT 0.4 09/03/2022 1412       RADIOGRAPHIC STUDIES:  No results found.   ASSESSMENT/PLAN:  This is a very pleasant 81 year old Caucasian female diagnosed with extensive stage (T1b, N3, M1 C) small cell lung cancer.  She presented with right upper lobe pulmonary nodules in addition to bilateral hilar and right mediastinal as well as left supraclavicular and abdominal lymphadenopathy.  She also has metastatic disease to the in the left humerus.  She was diagnosed in May 2020.   She is status post palliative radiotherapy to the left proximal humerus under the care of Dr. Lisbeth Renshaw in 2020.   She is currently undergoing palliative systemic chemotherapy with carboplatin for AUC of 5 on day 1, etoposide 100 mg/M2 on days 1, 2 and 3 as well as Imfinzi 1500 mg every 3 weeks with the chemotherapy and Neulasta support. Status post 47 cycles. Starting from cycle #5 the patient has been treated with maintenance treatment with Imfinzi 1500 mg IV every 4 weeks.  She had a pathological fracture in the left humerus underwent additional radiation to this area. Last treatment was 04/21/20.   She was supposed to have a restaging CT scan prior to her appointment today; however, this has not been scheduled until 1/19 due to ***. The patient understands it is our recommendation to have this performed sooner as  if there is signs of progression, it is better to catch it sooner.    Labs were reviewed.  Recommend that she proceed with cycle #48 today scheduled.   We will see her back for follow-up visit in 4 weeks for evaluation before starting cycle #49   Her last CT scan in September, the patient had an enlarging lymph node which we are watching closely.  I will arrange for restaging CT scan of the chest, abdomen, and pelvis prior to her next appointment.  If this area continues to enlarge, we will consider radiation to this area, unless  there are additional sites of progression.   The patient was advised to call immediately if she has any concerning symptoms in the interval. The patient voices understanding of current disease status and treatment options and is in agreement with the current care plan. All questions were answered. The patient knows to call the clinic with any problems, questions or concerns. We can certainly see the patient much sooner if necessary         No orders of the defined types were placed in this encounter.    I spent {CHL ONC TIME VISIT - JLUNG:[id number]} counseling the patient face to face. The total time spent in the appointment was {CHL ONC TIME VISIT - GFREV:[id number]}.  Mayeli Bornhorst L Stormey Wilborn, PA-C 09/30/22

## 2022-10-02 ENCOUNTER — Ambulatory Visit: Payer: Federal, State, Local not specified - PPO | Admitting: Physician Assistant

## 2022-10-02 ENCOUNTER — Other Ambulatory Visit: Payer: Federal, State, Local not specified - PPO

## 2022-10-02 ENCOUNTER — Ambulatory Visit: Payer: Federal, State, Local not specified - PPO

## 2022-10-03 ENCOUNTER — Inpatient Hospital Stay: Payer: Federal, State, Local not specified - PPO

## 2022-10-03 ENCOUNTER — Inpatient Hospital Stay (HOSPITAL_BASED_OUTPATIENT_CLINIC_OR_DEPARTMENT_OTHER): Payer: Federal, State, Local not specified - PPO | Admitting: Physician Assistant

## 2022-10-03 ENCOUNTER — Inpatient Hospital Stay: Payer: Federal, State, Local not specified - PPO | Attending: Oncology

## 2022-10-03 ENCOUNTER — Other Ambulatory Visit: Payer: Self-pay

## 2022-10-03 VITALS — BP 134/74 | HR 73 | Temp 98.0°F | Resp 15 | Ht 62.0 in | Wt 149.5 lb

## 2022-10-03 DIAGNOSIS — Z5111 Encounter for antineoplastic chemotherapy: Secondary | ICD-10-CM | POA: Diagnosis not present

## 2022-10-03 DIAGNOSIS — C3411 Malignant neoplasm of upper lobe, right bronchus or lung: Secondary | ICD-10-CM

## 2022-10-03 DIAGNOSIS — Z95828 Presence of other vascular implants and grafts: Secondary | ICD-10-CM

## 2022-10-03 DIAGNOSIS — Z5112 Encounter for antineoplastic immunotherapy: Secondary | ICD-10-CM | POA: Diagnosis not present

## 2022-10-03 DIAGNOSIS — R0609 Other forms of dyspnea: Secondary | ICD-10-CM | POA: Diagnosis not present

## 2022-10-03 DIAGNOSIS — C7951 Secondary malignant neoplasm of bone: Secondary | ICD-10-CM | POA: Insufficient documentation

## 2022-10-03 DIAGNOSIS — Z79899 Other long term (current) drug therapy: Secondary | ICD-10-CM | POA: Insufficient documentation

## 2022-10-03 LAB — CBC WITH DIFFERENTIAL (CANCER CENTER ONLY)
Abs Immature Granulocytes: 0.02 10*3/uL (ref 0.00–0.07)
Basophils Absolute: 0.1 10*3/uL (ref 0.0–0.1)
Basophils Relative: 1 %
Eosinophils Absolute: 0.3 10*3/uL (ref 0.0–0.5)
Eosinophils Relative: 3 %
HCT: 38.1 % (ref 36.0–46.0)
Hemoglobin: 13 g/dL (ref 12.0–15.0)
Immature Granulocytes: 0 %
Lymphocytes Relative: 18 %
Lymphs Abs: 1.6 10*3/uL (ref 0.7–4.0)
MCH: 32.7 pg (ref 26.0–34.0)
MCHC: 34.1 g/dL (ref 30.0–36.0)
MCV: 95.7 fL (ref 80.0–100.0)
Monocytes Absolute: 0.8 10*3/uL (ref 0.1–1.0)
Monocytes Relative: 9 %
Neutro Abs: 6.2 10*3/uL (ref 1.7–7.7)
Neutrophils Relative %: 69 %
Platelet Count: 247 10*3/uL (ref 150–400)
RBC: 3.98 MIL/uL (ref 3.87–5.11)
RDW: 13.1 % (ref 11.5–15.5)
WBC Count: 8.9 10*3/uL (ref 4.0–10.5)
nRBC: 0 % (ref 0.0–0.2)

## 2022-10-03 LAB — CMP (CANCER CENTER ONLY)
ALT: 15 U/L (ref 0–44)
AST: 22 U/L (ref 15–41)
Albumin: 3.7 g/dL (ref 3.5–5.0)
Alkaline Phosphatase: 69 U/L (ref 38–126)
Anion gap: 7 (ref 5–15)
BUN: 19 mg/dL (ref 8–23)
CO2: 25 mmol/L (ref 22–32)
Calcium: 9.2 mg/dL (ref 8.9–10.3)
Chloride: 103 mmol/L (ref 98–111)
Creatinine: 0.77 mg/dL (ref 0.44–1.00)
GFR, Estimated: >60 mL/min (ref >60)
Glucose, Bld: 102 mg/dL — ABNORMAL HIGH (ref 70–99)
Potassium: 4.4 mmol/L (ref 3.5–5.1)
Sodium: 135 mmol/L (ref 135–145)
Total Bilirubin: 0.7 mg/dL (ref 0.3–1.2)
Total Protein: 7.2 g/dL (ref 6.5–8.1)

## 2022-10-03 MED ORDER — SODIUM CHLORIDE 0.9% FLUSH
10.0000 mL | INTRAVENOUS | Status: DC | PRN
  Administered 2022-10-03: 10 mL

## 2022-10-03 MED ORDER — SODIUM CHLORIDE 0.9 % IV SOLN: Freq: Once | INTRAVENOUS | Status: AC

## 2022-10-03 MED ORDER — HEPARIN SOD (PORK) LOCK FLUSH 100 UNIT/ML IV SOLN
500.0000 [IU] | Freq: Once | INTRAVENOUS | Status: AC | PRN
  Administered 2022-10-03: 500 [IU]

## 2022-10-03 MED ORDER — SODIUM CHLORIDE 0.9 % IV SOLN
1500.0000 mg | Freq: Once | INTRAVENOUS | Status: AC
  Administered 2022-10-03: 1500 mg via INTRAVENOUS
  Filled 2022-10-03: qty 30

## 2022-10-03 NOTE — Patient Instructions (Signed)
[facility]  Discharge Instructions: Thank you for choosing [facility] to provide your oncology and hematology care.   If you have a lab appointment with the [facility], please go directly to the [facility] and check in at the registration area.   Wear comfortable clothing and clothing appropriate for easy access to any Portacath or PICC line.   We strive to give you quality time with your provider. You may need to reschedule your appointment if you arrive late (15 or more minutes).  Arriving late affects you and other patients whose appointments are after yours.  Also, if you miss three or more appointments without notifying the office, you may be dismissed from the clinic at the provider's discretion.      For prescription refill requests, have your pharmacy contact our office and allow 72 hours for refills to be completed.    Today you received the following chemotherapy and/or immunotherapy agents: Imfinzi      To help prevent nausea and vomiting after your treatment, we encourage you to take your nausea medication as directed.  BELOW ARE SYMPTOMS THAT SHOULD BE REPORTED IMMEDIATELY: *FEVER GREATER THAN 100.4 F (38 C) OR HIGHER *CHILLS OR SWEATING *NAUSEA AND VOMITING THAT IS NOT CONTROLLED WITH YOUR NAUSEA MEDICATION *UNUSUAL SHORTNESS OF BREATH *UNUSUAL BRUISING OR BLEEDING *URINARY PROBLEMS (pain or burning when urinating, or frequent urination) *BOWEL PROBLEMS (unusual diarrhea, constipation, pain near the anus) TENDERNESS IN MOUTH AND THROAT WITH OR WITHOUT PRESENCE OF ULCERS (sore throat, sores in mouth, or a toothache) UNUSUAL RASH, SWELLING OR PAIN  UNUSUAL VAGINAL DISCHARGE OR ITCHING   Items with * indicate a potential emergency and should be followed up as soon as possible or go to the Emergency Department if any problems should occur.  Please show the CHEMOTHERAPY ALERT CARD or IMMUNOTHERAPY ALERT CARD at check-in to  the Emergency Department and triage nurse.  Should you have questions after your visit or need to cancel or reschedule your appointment, please contact [facility]  Dept: [phone]  and follow the prompts.  Office hours are 8:00 a.m. to 4:30 p.m. Monday - Friday. Please note that voicemails left after 4:00 p.m. may not be returned until the following business day.  We are closed weekends and major holidays. You have access to a nurse at all times for urgent questions. Please call the main number to the clinic Dept: [phone] and follow the prompts.   For any non-urgent questions, you may also contact your provider using MyChart. We now offer e-Visits for anyone 28 and older to request care online for non-urgent symptoms. For details visit [email or web address].   Also download the MyChart app! Go to the app store, search "MyChart", open the app, select [facility], and log in with your MyChart username and password.

## 2022-10-03 NOTE — Progress Notes (Signed)
Per Cassie PA-C OK to proceed with tx today without CMP result

## 2022-10-26 ENCOUNTER — Ambulatory Visit (HOSPITAL_COMMUNITY)
Admission: RE | Admit: 2022-10-26 | Discharge: 2022-10-26 | Disposition: A | Discharge status: Home / Self Care | Payer: Federal, State, Local not specified - PPO | Source: Ambulatory Visit | Attending: Physician Assistant | Admitting: Physician Assistant

## 2022-10-26 DIAGNOSIS — C3411 Malignant neoplasm of upper lobe, right bronchus or lung: Secondary | ICD-10-CM | POA: Insufficient documentation

## 2022-10-26 MED ORDER — IOHEXOL 300 MG/ML  SOLN
100.0000 mL | Freq: Once | INTRAMUSCULAR | Status: AC | PRN
  Administered 2022-10-26: 100 mL via INTRAVENOUS

## 2022-10-26 MED ORDER — HEPARIN SOD (PORK) LOCK FLUSH 100 UNIT/ML IV SOLN
500.0000 [IU] | Freq: Once | INTRAVENOUS | Status: AC
  Administered 2022-10-26: 500 [IU] via INTRAVENOUS

## 2022-10-30 ENCOUNTER — Inpatient Hospital Stay (HOSPITAL_BASED_OUTPATIENT_CLINIC_OR_DEPARTMENT_OTHER): Payer: Federal, State, Local not specified - PPO | Admitting: Internal Medicine

## 2022-10-30 ENCOUNTER — Inpatient Hospital Stay: Payer: Federal, State, Local not specified - PPO | Attending: Internal Medicine

## 2022-10-30 ENCOUNTER — Other Ambulatory Visit: Payer: Self-pay

## 2022-10-30 ENCOUNTER — Inpatient Hospital Stay: Payer: Federal, State, Local not specified - PPO

## 2022-10-30 VITALS — BP 184/83 | HR 84 | Temp 97.5°F | Resp 15 | Wt 149.6 lb

## 2022-10-30 VITALS — BP 138/78

## 2022-10-30 DIAGNOSIS — Z923 Personal history of irradiation: Secondary | ICD-10-CM | POA: Insufficient documentation

## 2022-10-30 DIAGNOSIS — Z5112 Encounter for antineoplastic immunotherapy: Secondary | ICD-10-CM | POA: Insufficient documentation

## 2022-10-30 DIAGNOSIS — Z79899 Other long term (current) drug therapy: Secondary | ICD-10-CM | POA: Insufficient documentation

## 2022-10-30 DIAGNOSIS — C3411 Malignant neoplasm of upper lobe, right bronchus or lung: Secondary | ICD-10-CM | POA: Insufficient documentation

## 2022-10-30 DIAGNOSIS — G629 Polyneuropathy, unspecified: Secondary | ICD-10-CM | POA: Diagnosis not present

## 2022-10-30 DIAGNOSIS — Z95828 Presence of other vascular implants and grafts: Secondary | ICD-10-CM

## 2022-10-30 LAB — CMP (CANCER CENTER ONLY)
ALT: 12 U/L (ref 0–44)
AST: 21 U/L (ref 15–41)
Albumin: 3.7 g/dL (ref 3.5–5.0)
Alkaline Phosphatase: 75 U/L (ref 38–126)
Anion gap: 6 (ref 5–15)
BUN: 21 mg/dL (ref 8–23)
CO2: 27 mmol/L (ref 22–32)
Calcium: 9.4 mg/dL (ref 8.9–10.3)
Chloride: 105 mmol/L (ref 98–111)
Creatinine: 0.75 mg/dL (ref 0.44–1.00)
GFR, Estimated: >60 mL/min (ref >60)
Glucose, Bld: 120 mg/dL — ABNORMAL HIGH (ref 70–99)
Potassium: 3.8 mmol/L (ref 3.5–5.1)
Sodium: 138 mmol/L (ref 135–145)
Total Bilirubin: 0.4 mg/dL (ref 0.3–1.2)
Total Protein: 6.8 g/dL (ref 6.5–8.1)

## 2022-10-30 LAB — CBC WITH DIFFERENTIAL (CANCER CENTER ONLY)
Abs Immature Granulocytes: 0.01 10*3/uL (ref 0.00–0.07)
Basophils Absolute: 0 10*3/uL (ref 0.0–0.1)
Basophils Relative: 1 %
Eosinophils Absolute: 0.3 10*3/uL (ref 0.0–0.5)
Eosinophils Relative: 4 %
HCT: 38 % (ref 36.0–46.0)
Hemoglobin: 13.2 g/dL (ref 12.0–15.0)
Immature Granulocytes: 0 %
Lymphocytes Relative: 16 %
Lymphs Abs: 1.3 10*3/uL (ref 0.7–4.0)
MCH: 33 pg (ref 26.0–34.0)
MCHC: 34.7 g/dL (ref 30.0–36.0)
MCV: 95 fL (ref 80.0–100.0)
Monocytes Absolute: 0.7 10*3/uL (ref 0.1–1.0)
Monocytes Relative: 8 %
Neutro Abs: 5.9 10*3/uL (ref 1.7–7.7)
Neutrophils Relative %: 71 %
Platelet Count: 252 10*3/uL (ref 150–400)
RBC: 4 MIL/uL (ref 3.87–5.11)
RDW: 12.9 % (ref 11.5–15.5)
WBC Count: 8.3 10*3/uL (ref 4.0–10.5)
nRBC: 0 % (ref 0.0–0.2)

## 2022-10-30 LAB — TSH: TSH: 3.517 u[IU]/mL (ref 0.350–4.500)

## 2022-10-30 MED ORDER — HEPARIN SOD (PORK) LOCK FLUSH 100 UNIT/ML IV SOLN
500.0000 [IU] | Freq: Once | INTRAVENOUS | Status: AC | PRN
  Administered 2022-10-30: 500 [IU]

## 2022-10-30 MED ORDER — SODIUM CHLORIDE 0.9 % IV SOLN: Freq: Once | INTRAVENOUS | Status: AC

## 2022-10-30 MED ORDER — SODIUM CHLORIDE 0.9% FLUSH
10.0000 mL | INTRAVENOUS | Status: DC | PRN
  Administered 2022-10-30: 10 mL

## 2022-10-30 MED ORDER — SODIUM CHLORIDE 0.9 % IV SOLN
1500.0000 mg | Freq: Once | INTRAVENOUS | Status: AC
  Administered 2022-10-30: 1500 mg via INTRAVENOUS
  Filled 2022-10-30: qty 30

## 2022-10-30 NOTE — Patient Instructions (Signed)
[facility]  Discharge Instructions: Thank you for choosing [facility] to provide your oncology and hematology care.   If you have a lab appointment with the Cancer Center, please go directly to the Cancer Center and check in at the registration area.   Wear comfortable clothing and clothing appropriate for easy access to any Portacath or PICC line.   We strive to give you quality time with your provider. You may need to reschedule your appointment if you arrive late (15 or more minutes).  Arriving late affects you and other patients whose appointments are after yours.  Also, if you miss three or more appointments without notifying the office, you may be dismissed from the clinic at the provider's discretion.      For prescription refill requests, have your pharmacy contact our office and allow 72 hours for refills to be completed.    Today you received the following chemotherapy and/or immunotherapy agents: Imfinzi      To help prevent nausea and vomiting after your treatment, we encourage you to take your nausea medication as directed.  BELOW ARE SYMPTOMS THAT SHOULD BE REPORTED IMMEDIATELY: *FEVER GREATER THAN 100.4 F (38 C) OR HIGHER *CHILLS OR SWEATING *NAUSEA AND VOMITING THAT IS NOT CONTROLLED WITH YOUR NAUSEA MEDICATION *UNUSUAL SHORTNESS OF BREATH *UNUSUAL BRUISING OR BLEEDING *URINARY PROBLEMS (pain or burning when urinating, or frequent urination) *BOWEL PROBLEMS (unusual diarrhea, constipation, pain near the anus) TENDERNESS IN MOUTH AND THROAT WITH OR WITHOUT PRESENCE OF ULCERS (sore throat, sores in mouth, or a toothache) UNUSUAL RASH, SWELLING OR PAIN  UNUSUAL VAGINAL DISCHARGE OR ITCHING   Items with * indicate a potential emergency and should be followed up as soon as possible or go to the Emergency Department if any problems should occur.  Please show the CHEMOTHERAPY ALERT CARD or IMMUNOTHERAPY ALERT CARD at  check-in to the Emergency Department and triage nurse.  Should you have questions after your visit or need to cancel or reschedule your appointment, please contact [facility]  Dept: [phone]  and follow the prompts.  Office hours are 8:00 a.m. to 4:30 p.m. Monday - Friday. Please note that voicemails left after 4:00 p.m. may not be returned until the following business day.  We are closed weekends and major holidays. You have access to a nurse at all times for urgent questions. Please call the main number to the clinic Dept: [phone] and follow the prompts.   For any non-urgent questions, you may also contact your provider using MyChart. We now offer e-Visits for anyone 13 and older to request care online for non-urgent symptoms. For details visit [email or web address].   Also download the MyChart app! Go to the app store, search "MyChart", open the app, select [facility], and log in with your MyChart username and password.

## 2022-10-30 NOTE — Progress Notes (Signed)
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   PRIOR THERAPY: None   CURRENT THERAPY: Palliative systemic chemotherapy with carboplatin for AUC of 5 on day 1, etoposide 100 mg/M2 on days 1, 2 and 3 as well as Imfinzi 1500 mg every 3 weeks with the chemotherapy and Neulasta support. First dose on 03/16/2019. Status post 48 cycles.   Starting from cycle #5 the patient will be treated with maintenance treatment with Imfinzi 1500 mg IV every 4 weeks.  INTERVAL HISTORY: Brittany Marks 82 y.o. female returns to the clinic today for follow-up visit.  The patient is feeling fine today with no concerning complaints except for the baseline fatigue and arthritis.  She denied having any current chest pain, shortness of breath, cough or hemoptysis.  She has no nausea, vomiting, diarrhea or constipation.  She has no headache or visual changes.  She denied having any recent weight loss or night sweats.  The patient continues to tolerate her treatment with Imfinzi fairly well.  She had repeat CT scan of the chest, abdomen and pelvis performed recently and she is here for evaluation and discussion of her scan results.   MEDICAL HISTORY: Past Medical History:  Diagnosis Date   Cataract    Bilateral   Constipation    pain medication   Left rotator cuff tear    Right rotator cuff tear    SCL CA dx'd 11/2018    ALLERGIES:  is allergic to oxycodone.  MEDICATIONS:  Current Outpatient Medications  Medication Sig Dispense Refill   albuterol (VENTOLIN HFA) 108 (90 Base) MCG/ACT inhaler Inhale 2 puffs into the lungs every 6 (six)  hours as needed for wheezing or shortness of breath. 8 g 2   Ascorbic Acid (VITAMIN C PO) Take by mouth daily.      b complex vitamins tablet Take 1 tablet by mouth daily.     COD LIVER OIL PO Take 1 tablet by mouth daily.      gabapentin (NEURONTIN) 100 MG capsule Take 1 capsule (100 mg total) by mouth 2 (two) times daily. 90 capsule 2   hydrocortisone 2.5 % cream Apply topically as needed. (Patient taking differently: Apply 1 application  topically daily as needed (Treatment).) 3.5 g 0   ibuprofen (ADVIL) 200 MG tablet Take 600 mg by mouth daily.     lidocaine-prilocaine (EMLA) cream Apply 1 Application topically every 30 (thirty) days. 30 g 2   Multiple Vitamin (MULTIVITAMIN) tablet Take 1 tablet by mouth daily.     ondansetron (ZOFRAN) 4 MG tablet Take 1 tablet (4 mg total) by mouth every 8 (eight) hours as needed for nausea or vomiting. 10 tablet 0   oxymetazoline (AFRIN) 0.05 % nasal spray Place 2 sprays into the nose 2 (two) times daily as needed (Bloody nose).     prochlorperazine (COMPAZINE) 10 MG tablet Take 1 tablet (10 mg total) by mouth every 6 (six) hours as needed for nausea or vomiting. 30 tablet 0   VITAMIN E PO Take 1 tablet by mouth daily.      No current facility-administered medications for this visit.   Facility-Administered  Medications Ordered in Other Visits  Medication Dose Route Frequency Provider Last Rate Last Admin   heparin lock flush 100 unit/mL  500 Units Intracatheter Once PRN Magrinat, Valentino Hue, MD       sodium chloride flush (NS) 0.9 % injection 10 mL  10 mL Intracatheter PRN Magrinat, Valentino Hue, MD        SURGICAL HISTORY:  Past Surgical History:  Procedure Laterality Date   CATARACT EXTRACTION W/ INTRAOCULAR LENS  IMPLANT, BILATERAL  2012   DECOMPRESSIVE LUMBAR LAMINECTOMY LEVEL 2 N/A 07/29/2013   Procedure: LUMBAR LAMINECTOMY, DECOMPRESSION LUMBAR THREE TO FOUR, FOUR TO FIVE microdiscectomy l3,4 right;  Surgeon: Jacki Cones, MD;  Location: [facility];   Service: Orthopedics;  Laterality: N/A;   I & D SUPERIOR RIGHT SHOULDER AND CLOSURE WOUND  01-10-2011   S/P ROTATOR CUFF REPAIR   IR IMAGING GUIDED PORT INSERTION  04/09/2019   LUMBAR LAMINECTOMY  1970'S   LUMBAR LAMINECTOMY/DECOMPRESSION MICRODISCECTOMY N/A 06/27/2016   Procedure: L1 - L2 DISCECTOMY;  Surgeon: Venita Lick, MD;  Location: [facility];  Service: Orthopedics;  Laterality: N/A;   LUMBAR SPINE SURGERY  1983   REVERSE SHOULDER ARTHROPLASTY Left 03/10/2020   Procedure: REVERSE SHOULDER ARTHROPLASTY;  Surgeon: Francena Hanly, MD;  Location: [facility];  Service: Orthopedics;  Laterality: Left;    RIGHT SHOULDER ARTHROSCOPY/ OPEN DISTAL CLAVICLE RESECTION/ SAD/ OPEN ROTATOR CUFF REPAIR  11-28-2010   SHOULDER FUSION SURGERY  03/11/2020   Shoulder Surgery , hardware placed   SHOULDER OPEN ROTATOR CUFF REPAIR  12/19/2011   Procedure: ROTATOR CUFF REPAIR SHOULDER OPEN;  Surgeon: Drucilla Schmidt, MD;  Location: [facility];  Service: Orthopedics;  Laterality: Right;  RIGHT RECURRENT OPEN REPAIR OF THE ROTATOR CUFF WITH TISSUE MEND GRAFTANTERIOR CHROMIOECTOMY   VAGINAL HYSTERECTOMY  1979    REVIEW OF SYSTEMS:  Constitutional: positive for fatigue Eyes: negative Ears, nose, mouth, throat, and face: negative Respiratory: positive for dyspnea on exertion Cardiovascular: negative Gastrointestinal: negative Genitourinary:negative Integument/breast: negative Hematologic/lymphatic: negative Musculoskeletal:positive for arthralgias and muscle weakness Neurological: negative Behavioral/Psych: negative Endocrine: negative Allergic/Immunologic: negative   PHYSICAL EXAMINATION: General appearance: alert, cooperative, fatigued, and no distress Head: Normocephalic, without obvious abnormality, atraumatic Neck: no adenopathy, no JVD, supple, symmetrical, trachea midline, and thyroid not enlarged, symmetric, no tenderness/mass/nodules Lymph nodes: Cervical, supraclavicular, and  axillary nodes normal. Resp: clear to auscultation bilaterally Back: symmetric, no curvature. ROM normal. No CVA tenderness. Cardio: regular rate and rhythm, S1, S2 normal, no murmur, click, rub or gallop GI: soft, non-tender; bowel sounds normal; no masses,  no organomegaly Extremities: extremities normal, atraumatic, no cyanosis or edema Neurologic: Alert and oriented X 3, normal strength and tone. Normal symmetric reflexes. Normal coordination and gait   ECOG PERFORMANCE STATUS: 1 - Symptomatic but completely ambulatory  Blood pressure (!) 184/83, pulse 84, temperature (!) 97.5 F (36.4 C), temperature source Oral, resp. rate 15, weight 149 lb 9.6 oz (67.9 kg), SpO2 93 %.  LABORATORY DATA: Lab Results  Component Value Date   WBC 8.3 10/30/2022   HGB 13.2 10/30/2022   HCT 38.0 10/30/2022   MCV 95.0 10/30/2022   PLT 252 10/30/2022      Chemistry      Component Value Date/Time   NA 135 10/03/2022 1429   K 4.4 10/03/2022 1429   CL 103 10/03/2022 1429   CO2 25 10/03/2022 1429   BUN 19 10/03/2022 1429   CREATININE 0.77 10/03/2022 1429      Component Value Date/Time  CALCIUM 9.2 10/03/2022 1429   ALKPHOS 69 10/03/2022 1429   AST 22 10/03/2022 1429   ALT 15 10/03/2022 1429   BILITOT 0.7 10/03/2022 1429       RADIOGRAPHIC STUDIES: CT Chest W Contrast  Result Date: 10/26/2022 CLINICAL DATA:  Assess treatment small-cell lung cancer right upper lobe. Status post XRT. Immunotherapy ongoing cough EXAM: CT CHEST, ABDOMEN, AND PELVIS WITH CONTRAST TECHNIQUE: Multidetector CT imaging of the chest, abdomen and pelvis was performed following the standard protocol during bolus administration of intravenous contrast. RADIATION DOSE REDUCTION: This exam was performed according to the departmental dose-optimization program which includes automated exposure control, adjustment of the mA and/or kV according to patient size and/or use of iterative reconstruction technique. CONTRAST:   OMNIPAQUE IOHEXOL 300 MG/ML  SOLN COMPARISON:  07/06/2022 and older FINDINGS: CT CHEST FINDINGS Cardiovascular: Right upper chest port. The thoracic aorta has a normal course and caliber there is extensive calcified plaque with areas of significant stenosis identified along several vessels including most severely involving the left subclavian artery origin. Please correlate for any specific symptoms including for subclavian steal. Small pericardial effusion. Coronary artery calcifications are seen. Mediastinum/Nodes: No specific abnormal lymph node enlargement identified in the axillary region bilaterally. Follow up of previously seen enlarged mediastinal nodes is performed. The lower right paratracheal node which previously would have measured 8 by 10 mm, today on series 505, image 20 measures 13 x 10 mm, slightly larger. Prevascular node which has a 6 mm short axis appearance on the prior, today once again has some calcification. Short axis measurement of 10 mm. Longitudinal 20 mm. Again enlarging. Subcarinal node on image 25 today measures 17 by 10 mm and previously when measured would have measured 17 by 10 mm when measured in the same fashion today. There is also some hilar nodes. On the left on series 505, image 27 measuring 14 x 12 mm and previously this would have measured 16 by 11 mm. Additional left hilar nodes are similar. Normal caliber thoracic esophagus. Small thyroid gland. Lungs/Pleura: No pneumothorax or effusion. No consolidation. Extensive areas of interstitial septal thickening, fibrosis with areas of bronchiectasis and honeycombing and peribronchial thickening. More confluent patchy ill-defined opacity peripherally left upper lobe anteriorly is also stable. Associated pleural thickening. This area in the left upper lobe anteriorly was measured in thickness on the prior 18 mm and today similar when measured in the same fashion. Musculoskeletal: Diffuse bony sclerosis identified along the spine.  Scattered advanced degenerative changes are seen. Note is made of streak artifact from left shoulder arthroplasty. There is soft tissue thickening and fluid in the tissue surrounding the arthroplasty. Advanced degenerative changes seen of the right shoulder. CT ABDOMEN PELVIS FINDINGS Hepatobiliary: Fatty liver infiltration identified. Patent portal vein. Gallbladder is contracted. Pancreas: Preserved pancreatic parenchyma.  No obvious mass. Spleen: Spleen is nonenlarged. Adrenals/Urinary Tract: Right adrenal gland is preserved. The left is slightly thickened diffusely, nonspecific and unchanged. There is mild atrophy of both kidneys. 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 No delayed enhancement or excretion from the right kidney. Caliber change at the UPJ. Again likely congenital. Otherwise uterus normal course and caliber down to the bladder. Preserved contours of the urinary bladder. Stomach/Bowel: On this non oral contrast exam, the large bowel has a normal course and caliber with scattered stool. The stomach and small bowel are nondilated. Vascular/Lymphatic: Extensive atherosclerotic calcifications along the aorta. There is an occluded right common iliac artery. Significant stenosis of the left. Please  correlate with symptoms. No specific abnormal lymph node enlargement identified in the abdomen and pelvis there continues to be some ill-defined low-density tissue surrounding the hepatic artery but this has been present since at least August 2020. No specific additional follow-up due to long-term stability. Please correlate for any known history. Reproductive: Status post hysterectomy. No adnexal masses. Other: No abdominal wall hernia or abnormality. No abdominopelvic ascites. Only a small fat containing umbilical hernia. Musculoskeletal: Advanced degenerative changes along the spine and pelvis. There are areas of stenosis along the lumbar spine with multilevel listhesis including  retrolisthesis at L2-3, L1-2 and T12-L1. IMPRESSION: Multiple mediastinal hilar lymph nodes are again identified. A few of the mediastinal nodes are slightly larger today compared to the most recent prior. Recommend continued follow-up. Left hilar nodes are similar. Extensive lung fibrosis.  No new lung mass lesion. In the abdomen no new mass lesion, fluid collection or lymph node enlargement. Stable findings. Fatty liver infiltration. Extensive atherosclerotic changes including occlusion of the right common iliac artery and high-grade stenosis of the left subclavian. Please correlate with any specific symptoms. Advanced multilevel degenerative changes along the shoulders, spine and pelvis. Ill-defined low-density tissue surrounding the hepatic artery without narrowing but has been stable since at least August 2020. Please correlate for any known history. Simple attention on follow-up. Electronically Signed   By: Karen Kays M.D.   On: 10/26/2022 18:33   CT Abdomen Pelvis W Contrast  Result Date: 10/26/2022 CLINICAL DATA:  Assess treatment small-cell lung cancer right upper lobe. Status post XRT. Immunotherapy ongoing cough EXAM: CT CHEST, ABDOMEN, AND PELVIS WITH CONTRAST TECHNIQUE: Multidetector CT imaging of the chest, abdomen and pelvis was performed following the standard protocol during bolus administration of intravenous contrast. RADIATION DOSE REDUCTION: This exam was performed according to the departmental dose-optimization program which includes automated exposure control, adjustment of the mA and/or kV according to patient size and/or use of iterative reconstruction technique. CONTRAST:  OMNIPAQUE IOHEXOL 300 MG/ML  SOLN COMPARISON:  07/06/2022 and older FINDINGS: CT CHEST FINDINGS Cardiovascular: Right upper chest port. The thoracic aorta has a normal course and caliber there is extensive calcified plaque with areas of significant stenosis identified along several vessels including most  severely involving the left subclavian artery origin. Please correlate for any specific symptoms including for subclavian steal. Small pericardial effusion. Coronary artery calcifications are seen. Mediastinum/Nodes: No specific abnormal lymph node enlargement identified in the axillary region bilaterally. Follow up of previously seen enlarged mediastinal nodes is performed. The lower right paratracheal node which previously would have measured 8 by 10 mm, today on series 505, image 20 measures 13 x 10 mm, slightly larger. Prevascular node which has a 6 mm short axis appearance on the prior, today once again has some calcification. Short axis measurement of 10 mm. Longitudinal 20 mm. Again enlarging. Subcarinal node on image 25 today measures 17 by 10 mm and previously when measured would have measured 17 by 10 mm when measured in the same fashion today. There is also some hilar nodes. On the left on series 505, image 27 measuring 14 x 12 mm and previously this would have measured 16 by 11 mm. Additional left hilar nodes are similar. Normal caliber thoracic esophagus. Small thyroid gland. Lungs/Pleura: No pneumothorax or effusion. No consolidation. Extensive areas of interstitial septal thickening, fibrosis with areas of bronchiectasis and honeycombing and peribronchial thickening. More confluent patchy ill-defined opacity peripherally left upper lobe anteriorly is also stable. Associated pleural thickening. This area in the  left upper lobe anteriorly was measured in thickness on the prior 18 mm and today similar when measured in the same fashion. Musculoskeletal: Diffuse bony sclerosis identified along the spine. Scattered advanced degenerative changes are seen. Note is made of streak artifact from left shoulder arthroplasty. There is soft tissue thickening and fluid in the tissue surrounding the arthroplasty. Advanced degenerative changes seen of the right shoulder. CT ABDOMEN PELVIS FINDINGS Hepatobiliary: Fatty  liver infiltration identified. Patent portal vein. Gallbladder is contracted. Pancreas: Preserved pancreatic parenchyma.  No obvious mass. Spleen: Spleen is nonenlarged. Adrenals/Urinary Tract: Right adrenal gland is preserved. The left is slightly thickened diffusely, nonspecific and unchanged. There is mild atrophy of both kidneys. 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 No delayed enhancement or excretion from the right kidney. Caliber change at the UPJ. Again likely congenital. Otherwise uterus normal course and caliber down to the bladder. Preserved contours of the urinary bladder. Stomach/Bowel: On this non oral contrast exam, the large bowel has a normal course and caliber with scattered stool. The stomach and small bowel are nondilated. Vascular/Lymphatic: Extensive atherosclerotic calcifications along the aorta. There is an occluded right common iliac artery. Significant stenosis of the left. Please correlate with symptoms. No specific abnormal lymph node enlargement identified in the abdomen and pelvis there continues to be some ill-defined low-density tissue surrounding the hepatic artery but this has been present since at least August 2020. No specific additional follow-up due to long-term stability. Please correlate for any known history. Reproductive: Status post hysterectomy. No adnexal masses. Other: No abdominal wall hernia or abnormality. No abdominopelvic ascites. Only a small fat containing umbilical hernia. Musculoskeletal: Advanced degenerative changes along the spine and pelvis. There are areas of stenosis along the lumbar spine with multilevel listhesis including retrolisthesis at L2-3, L1-2 and T12-L1. IMPRESSION: Multiple mediastinal hilar lymph nodes are again identified. A few of the mediastinal nodes are slightly larger today compared to the most recent prior. Recommend continued follow-up. Left hilar nodes are similar. Extensive lung fibrosis.  No new lung mass  lesion. In the abdomen no new mass lesion, fluid collection or lymph node enlargement. Stable findings. Fatty liver infiltration. Extensive atherosclerotic changes including occlusion of the right common iliac artery and high-grade stenosis of the left subclavian. Please correlate with any specific symptoms. Advanced multilevel degenerative changes along the shoulders, spine and pelvis. Ill-defined low-density tissue surrounding the hepatic artery without narrowing but has been stable since at least August 2020. Please correlate for any known history. Simple attention on follow-up. Electronically Signed   By: Karen Kays M.D.   On: 10/26/2022 18:33     ASSESSMENT AND PLAN: This is a very pleasant 82 years old white female with extensive stage small cell lung cancer and she is currently undergoing treatment with carboplatin, etoposide and Imfinzi status post 5 cycles.  Starting from cycle #6 the patient is on maintenance treatment with single agent Imfinzi every 4 weeks status post 48 cycles of total treatment. The patient has been tolerating her treatment with Imfinzi fairly well with no significant adverse effects. She had repeat CT scan of the chest, abdomen and pelvis performed recently.  I personally and independently reviewed the scan and discussed the result with the patient today. Her scan showed no concerning findings for disease progression except for slightly enlarging few mediastinal lymph nodes. I recommended for her to continue her current treatment with maintenance Imfinzi with the same dose. I will see her back for follow-up visit in 4 weeks  for evaluation before the next cycle of her treatment. She was advised to call immediately if she has any other concerning symptoms in the interval. For the peripheral neuropathy, she will continue her current treatment with gabapentin. The patient voices understanding of current disease status and treatment options and is in agreement with the current  care plan.  All questions were answered. The patient knows to call the clinic with any problems, questions or concerns. We can certainly see the patient much sooner if necessary. The total time spent in the appointment was 30 minutes.  Disclaimer: This note was dictated with voice recognition software. Similar sounding words can inadvertently be transcribed and may not be corrected upon review.

## 2022-10-31 ENCOUNTER — Other Ambulatory Visit: Payer: Self-pay

## 2022-11-02 ENCOUNTER — Telehealth: Payer: Self-pay | Admitting: Internal Medicine

## 2022-11-02 ENCOUNTER — Other Ambulatory Visit: Payer: Self-pay

## 2022-11-02 NOTE — Telephone Encounter (Signed)
Called patient to confirm appointments. Left voicemail with next appointment details.

## 2022-11-11 ENCOUNTER — Other Ambulatory Visit: Payer: Self-pay

## 2022-11-13 ENCOUNTER — Telehealth: Payer: Self-pay | Admitting: Internal Medicine

## 2022-11-13 ENCOUNTER — Telehealth: Payer: Self-pay | Admitting: Physician Assistant

## 2022-11-13 NOTE — Telephone Encounter (Signed)
Patient called transferred to me to speak about time in between treatments. Spoke with PA and RN. Patient will discuss treatment dates at 2/26 appointment.

## 2022-11-13 NOTE — Telephone Encounter (Signed)
Called patient regarding February-March appointments, patient is notified.

## 2022-11-25 ENCOUNTER — Other Ambulatory Visit: Payer: Self-pay

## 2022-11-27 ENCOUNTER — Ambulatory Visit: Payer: Federal, State, Local not specified - PPO | Admitting: Physician Assistant

## 2022-11-27 ENCOUNTER — Other Ambulatory Visit: Payer: Federal, State, Local not specified - PPO

## 2022-11-27 ENCOUNTER — Ambulatory Visit: Payer: Federal, State, Local not specified - PPO

## 2022-11-29 ENCOUNTER — Other Ambulatory Visit: Payer: Self-pay

## 2022-11-29 NOTE — Progress Notes (Signed)
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     PRIOR THERAPY: 1) Palliative radiotherapy to the left proximal humerus under the care of Dr. Lisbeth Renshaw. 2) Additional palliative radiotherapy to the proximal humerus under the care of Dr. Lisbeth Renshaw. Last treatment on 04/21/2020  CURRENT THERAPY:  Palliative systemic chemotherapy with carboplatin for AUC of 5 on day 1, etoposide 100 mg/M2 on days 1, 2 and 3 as well as Imfinzi 1500 mg every 3 weeks with the chemotherapy and Neulasta support. First dose on 03/16/2019. Status post 49 cycles. Starting from cycle #5 the patient will be treated with maintenance treatment with Imfinzi 1500 mg IV every 4 weeks.    INTERVAL HISTORY: Brittany Marks 82 y.o. female returns to the clinic today for a follow-up visit.  The patient is feeling fine today without any concerning complaints except she has chronic back pain and has a history of several back surgeries. She is not interested in going to a pain clinic or following up with orthopedics. She is wanting prednisone as she was given prednisone in the past. She denies any changes in her health today otherwise. She reports her baseline fatigue. The patient denies any recent fever, chills, or night sweats, or weight loss. She reports her baseline dyspnea on exertion depending on the activity. She reports her chronic cough, mostly at night. She does not have a PCP. She denies any chest pain or hemoptysis.  She denies any nausea, vomiting, diarrhea, or constipation. She denies any headaches or visual changes. She denies any rashes.  She still has limited mobility in her left shoulder where she previously  had radiation and surgery on the metastatic bone lesion in 2020. She is here today for evaluation and repeat blood work before starting cycle #50   MEDICAL HISTORY: Past Medical History:  Diagnosis Date   Cataract    Bilateral   Constipation    pain medication   Left rotator cuff tear    Right rotator cuff tear    SCL CA dx'd 11/2018    ALLERGIES:  is allergic to oxycodone.  MEDICATIONS:  Current Outpatient Medications  Medication Sig Dispense Refill   methylPREDNISolone (MEDROL DOSEPAK) 4 MG TBPK tablet Use as instructed 21 tablet 0   albuterol (VENTOLIN HFA) 108 (90 Base) MCG/ACT inhaler Inhale 2 puffs into the lungs every 6 (six) hours as needed for wheezing or shortness of breath. 8 g 2   Ascorbic Acid (VITAMIN C PO) Take by mouth daily.      b complex vitamins tablet Take 1 tablet by mouth daily.     COD LIVER OIL PO Take 1 tablet by mouth daily.      gabapentin (NEURONTIN) 100 MG capsule Take 1 capsule (100 mg total) by mouth 2 (two) times daily. 90 capsule 2   hydrocortisone 2.5 % cream Apply topically as needed. (Patient taking differently: Apply 1 application  topically daily as needed (Treatment).) 3.5 g 0   ibuprofen (ADVIL) 200 MG tablet Take 600 mg by mouth daily.     lidocaine-prilocaine (EMLA) cream Apply 1 Application topically every 30 (thirty) days. 30 g 2   Multiple Vitamin (MULTIVITAMIN) tablet Take 1 tablet by mouth daily.  ondansetron (ZOFRAN) 4 MG tablet Take 1 tablet (4 mg total) by mouth every 8 (eight) hours as needed for nausea or vomiting. 10 tablet 0   oxymetazoline (AFRIN) 0.05 % nasal spray Place 2 sprays into the nose 2 (two) times daily as needed (Bloody nose).     prochlorperazine (COMPAZINE) 10 MG tablet Take 1 tablet (10 mg total) by mouth every 6 (six) hours as needed for nausea or vomiting. 30 tablet 0   VITAMIN E PO Take 1 tablet by mouth daily.      No current facility-administered medications for this visit.   Facility-Administered  Medications Ordered in Other Visits  Medication Dose Route Frequency Provider Last Rate Last Admin   0.9 %  sodium chloride infusion   Intravenous Once Curt Bears, MD       durvalumab [facility]) 1,500 mg in sodium chloride 0.9 % 100 mL chemo infusion  1,500 mg Intravenous Once Curt Bears, MD       heparin lock flush 100 unit/mL  500 Units Intracatheter Once PRN Magrinat, Virgie Dad, MD       heparin lock flush 100 unit/mL  500 Units Intracatheter Once PRN Curt Bears, MD       sodium chloride flush (NS) 0.9 % injection 10 mL  10 mL Intracatheter PRN Magrinat, Virgie Dad, MD       sodium chloride flush (NS) 0.9 % injection 10 mL  10 mL Intracatheter PRN Curt Bears, MD        SURGICAL HISTORY:  Past Surgical History:  Procedure Laterality Date   CATARACT EXTRACTION W/ INTRAOCULAR LENS  IMPLANT, BILATERAL  2012   DECOMPRESSIVE LUMBAR LAMINECTOMY LEVEL 2 N/A 07/29/2013   Procedure: LUMBAR LAMINECTOMY, DECOMPRESSION LUMBAR THREE TO FOUR, FOUR TO FIVE microdiscectomy l3,4 right;  Surgeon: Tobi Bastos, MD;  Location: [facility];  Service: Orthopedics;  Laterality: N/A;   I & D SUPERIOR RIGHT SHOULDER AND CLOSURE WOUND  01-10-2011   S/P ROTATOR CUFF REPAIR   IR IMAGING GUIDED PORT INSERTION  04/09/2019   LUMBAR LAMINECTOMY  1970'S   LUMBAR LAMINECTOMY/DECOMPRESSION MICRODISCECTOMY N/A 06/27/2016   Procedure: L1 - L2 DISCECTOMY;  Surgeon: Melina Schools, MD;  Location: [place];  Service: Orthopedics;  Laterality: N/A;   LUMBAR SPINE SURGERY  1983   REVERSE SHOULDER ARTHROPLASTY Left 03/10/2020   Procedure: REVERSE SHOULDER ARTHROPLASTY;  Surgeon: Justice Britain, MD;  Location: [facility];  Service: Orthopedics;  Laterality: Left;  121mn   RIGHT SHOULDER ARTHROSCOPY/ OPEN DISTAL CLAVICLE RESECTION/ SAD/ OPEN ROTATOR CUFF REPAIR  11-28-2010   SHOULDER FUSION SURGERY  03/11/2020   Shoulder Surgery , hardware placed   SHOULDER OPEN ROTATOR CUFF REPAIR  12/19/2011   Procedure: ROTATOR CUFF  REPAIR SHOULDER OPEN;  Surgeon: JMagnus Sinning MD;  Location: [facility]  Service: Orthopedics;  Laterality: Right;  RIGHT RECURRENT OPEN REPAIR OF THE ROTATOR CUFF WITH TISSUE MEND GRAFTANTERIOR CHROMIOECTOMY   VAGINAL HYSTERECTOMY  1979    REVIEW OF SYSTEMS:   Constitutional: Positive for fatigue. Negative for appetite change, chills, fever and unexpected weight change.  HENT: Negative for mouth sores, nosebleeds, sore throat and trouble swallowing.  Eyes: Negative for eye problems and icterus.  Respiratory: Positive for stable dyspnea on exertion and chronic cough (mostly at night). Negative for hemoptysis and wheezing.   Cardiovascular: Negative for chest pain and leg swelling.  Gastrointestinal: Negative for abdominal pain, recent diarrhea, constipation, diarrhea, nausea and vomiting.  Genitourinary: Negative for bladder incontinence, difficulty urinating, dysuria, frequency  and hematuria.   Musculoskeletal: Positive for chronic back pain. Negative for gait problem, neck pain and neck stiffness.  Skin: Negative for itching and rash.  Neurological: Positive for peripheral neuropathy. Negative for dizziness, extremity weakness, gait problem, headaches, light-headedness and seizures.  Hematological: Negative for adenopathy. Does not bruise/bleed easily.  Psychiatric/Behavioral: Negative for confusion, depression and sleep disturbance. The patient is not nervous/anxious.     PHYSICAL EXAMINATION:  Blood pressure (!) 140/64, pulse 74, temperature 97.9 F (36.6 C), temperature source Temporal, resp. rate 17, height '5\' 2"'$  (1.575 m), weight 151 lb 9.6 oz (68.8 kg), SpO2 99 %.  ECOG PERFORMANCE STATUS: 2-3  Physical Exam  Constitutional: Oriented to person, place, and time and chronically ill appearing female and in no distress.  HENT:  Head: Normocephalic and atraumatic.  Mouth/Throat: Oropharynx is clear and moist. No oropharyngeal exudate.  Eyes: Conjunctivae are  normal. Right eye exhibits no discharge. Left eye exhibits no discharge. No scleral icterus.  Neck: Normal range of motion. Neck supple.  Cardiovascular: Normal rate, regular rhythm, normal heart sounds.   Pulmonary/Chest: Effort normal and breath sounds normal. No respiratory distress. Crackles at the lung bases bilaterally.  Abdominal: Soft. Exhibits no distension and no mass. There is no tenderness.  Musculoskeletal: Normal range of motion. Exhibits no edema.  Lymphadenopathy:    No cervical adenopathy.  Neurological: Alert and oriented to person, place, and time. Exhibits muscle wasting. Examined in the wheelchair.  Skin: Skin is warm and dry. No rash noted. Not diaphoretic. No erythema. No pallor.  Psychiatric: Mood, memory and judgment normal.  Vitals reviewed.  LABORATORY DATA: Lab Results  Component Value Date   WBC 9.2 12/03/2022   HGB 13.1 12/03/2022   HCT 37.7 12/03/2022   MCV 95.4 12/03/2022   PLT 245 12/03/2022      Chemistry      Component Value Date/Time   NA 133 (L) 12/03/2022 1112   K 3.9 12/03/2022 1112   CL 101 12/03/2022 1112   CO2 26 12/03/2022 1112   BUN 19 12/03/2022 1112   CREATININE 0.68 12/03/2022 1112      Component Value Date/Time   CALCIUM 8.7 (L) 12/03/2022 1112   ALKPHOS 78 12/03/2022 1112   AST 20 12/03/2022 1112   ALT 14 12/03/2022 1112   BILITOT 0.4 12/03/2022 1112       RADIOGRAPHIC STUDIES:  No results found.   ASSESSMENT/PLAN:  This is a very pleasant 82 year old Caucasian female diagnosed with extensive stage (T1b, N3, M1 C) small cell lung cancer.  She presented with right upper lobe pulmonary nodules in addition to bilateral hilar and right mediastinal as well as left supraclavicular and abdominal lymphadenopathy.  She also has metastatic disease to the in the left humerus.  She was diagnosed in May 2020.   She is status post palliative radiotherapy to the left proximal humerus under the care of Dr. Lisbeth Renshaw in 2020.   She is  currently undergoing palliative systemic chemotherapy with carboplatin for AUC of 5 on day 1, etoposide 100 mg/M2 on days 1, 2 and 3 as well as Imfinzi 1500 mg every 3 weeks with the chemotherapy and Neulasta support. Status post 49 cycles. Starting from cycle #5 the patient has been treated with maintenance treatment with Imfinzi 1500 mg IV every 4 weeks.   She had a pathological fracture in the left humerus underwent additional radiation to this area. Last treatment was 04/21/20.    Labs were reviewed.  Recommend that she proceed with cycle #  50 today as scheduled.   We will see her back for follow-up visit in 4 weeks for evaluation before starting cycle #51  The patient understands that we would not recommend prednisone while undergoing immunotherapy and I encouraged her to consider following up with a pain clinic or for orthopedic surgeon to see what other options.  The patient declined.  She is adamant on being treated with prednisone.  I reviewed with Dr. Julien Nordmann.  I will send a Medrol Dosepak to her pharmacy but she was instructed not to begin taking this until next week.  The patient was advised to call immediately if she has any concerning symptoms in the interval. The patient voices understanding of current disease status and treatment options and is in agreement with the current care plan. All questions were answered. The patient knows to call the clinic with any problems, questions or concerns. We can certainly see the patient much sooner if necessary     No orders of the defined types were placed in this encounter.    The total time spent in the appointment was 20-29 minutes.   Shaivi Rothschild L Audryna Wendt, PA-C 12/03/22

## 2022-12-03 ENCOUNTER — Inpatient Hospital Stay: Payer: Federal, State, Local not specified - PPO | Attending: Internal Medicine

## 2022-12-03 ENCOUNTER — Other Ambulatory Visit: Payer: Self-pay

## 2022-12-03 ENCOUNTER — Inpatient Hospital Stay (HOSPITAL_BASED_OUTPATIENT_CLINIC_OR_DEPARTMENT_OTHER): Payer: Federal, State, Local not specified - PPO | Admitting: Physician Assistant

## 2022-12-03 ENCOUNTER — Inpatient Hospital Stay: Payer: Federal, State, Local not specified - PPO

## 2022-12-03 VITALS — BP 140/64 | HR 74 | Temp 97.9°F | Resp 17 | Ht 62.0 in | Wt 151.6 lb

## 2022-12-03 DIAGNOSIS — Z791 Long term (current) use of non-steroidal anti-inflammatories (NSAID): Secondary | ICD-10-CM | POA: Diagnosis not present

## 2022-12-03 DIAGNOSIS — Z5112 Encounter for antineoplastic immunotherapy: Secondary | ICD-10-CM | POA: Insufficient documentation

## 2022-12-03 DIAGNOSIS — Z95828 Presence of other vascular implants and grafts: Secondary | ICD-10-CM

## 2022-12-03 DIAGNOSIS — C3411 Malignant neoplasm of upper lobe, right bronchus or lung: Secondary | ICD-10-CM | POA: Diagnosis not present

## 2022-12-03 DIAGNOSIS — G8929 Other chronic pain: Secondary | ICD-10-CM

## 2022-12-03 DIAGNOSIS — K5900 Constipation, unspecified: Secondary | ICD-10-CM | POA: Diagnosis not present

## 2022-12-03 DIAGNOSIS — M549 Dorsalgia, unspecified: Secondary | ICD-10-CM | POA: Diagnosis not present

## 2022-12-03 DIAGNOSIS — C7951 Secondary malignant neoplasm of bone: Secondary | ICD-10-CM | POA: Insufficient documentation

## 2022-12-03 DIAGNOSIS — Z79899 Other long term (current) drug therapy: Secondary | ICD-10-CM | POA: Diagnosis not present

## 2022-12-03 LAB — CMP (CANCER CENTER ONLY)
ALT: 14 U/L (ref 0–44)
AST: 20 U/L (ref 15–41)
Albumin: 3.8 g/dL (ref 3.5–5.0)
Alkaline Phosphatase: 78 U/L (ref 38–126)
Anion gap: 6 (ref 5–15)
BUN: 19 mg/dL (ref 8–23)
CO2: 26 mmol/L (ref 22–32)
Calcium: 8.7 mg/dL — ABNORMAL LOW (ref 8.9–10.3)
Chloride: 101 mmol/L (ref 98–111)
Creatinine: 0.68 mg/dL (ref 0.44–1.00)
GFR, Estimated: >60 mL/min (ref >60)
Glucose, Bld: 121 mg/dL — ABNORMAL HIGH (ref 70–99)
Potassium: 3.9 mmol/L (ref 3.5–5.1)
Sodium: 133 mmol/L — ABNORMAL LOW (ref 135–145)
Total Bilirubin: 0.4 mg/dL (ref 0.3–1.2)
Total Protein: 7.1 g/dL (ref 6.5–8.1)

## 2022-12-03 LAB — CBC WITH DIFFERENTIAL (CANCER CENTER ONLY)
Abs Immature Granulocytes: 0.03 10*3/uL (ref 0.00–0.07)
Basophils Absolute: 0.1 10*3/uL (ref 0.0–0.1)
Basophils Relative: 1 %
Eosinophils Absolute: 0.3 10*3/uL (ref 0.0–0.5)
Eosinophils Relative: 3 %
HCT: 37.7 % (ref 36.0–46.0)
Hemoglobin: 13.1 g/dL (ref 12.0–15.0)
Immature Granulocytes: 0 %
Lymphocytes Relative: 15 %
Lymphs Abs: 1.4 10*3/uL (ref 0.7–4.0)
MCH: 33.2 pg (ref 26.0–34.0)
MCHC: 34.7 g/dL (ref 30.0–36.0)
MCV: 95.4 fL (ref 80.0–100.0)
Monocytes Absolute: 0.7 10*3/uL (ref 0.1–1.0)
Monocytes Relative: 8 %
Neutro Abs: 6.7 10*3/uL (ref 1.7–7.7)
Neutrophils Relative %: 73 %
Platelet Count: 245 10*3/uL (ref 150–400)
RBC: 3.95 MIL/uL (ref 3.87–5.11)
RDW: 13.3 % (ref 11.5–15.5)
WBC Count: 9.2 10*3/uL (ref 4.0–10.5)
nRBC: 0 % (ref 0.0–0.2)

## 2022-12-03 MED ORDER — SODIUM CHLORIDE 0.9% FLUSH
10.0000 mL | INTRAVENOUS | Status: DC | PRN
  Administered 2022-12-03: 10 mL

## 2022-12-03 MED ORDER — HEPARIN SOD (PORK) LOCK FLUSH 100 UNIT/ML IV SOLN
500.0000 [IU] | Freq: Once | INTRAVENOUS | Status: AC | PRN
  Administered 2022-12-03: 500 [IU]

## 2022-12-03 MED ORDER — SODIUM CHLORIDE 0.9 % IV SOLN
1500.0000 mg | Freq: Once | INTRAVENOUS | Status: AC
  Administered 2022-12-03: 1500 mg via INTRAVENOUS
  Filled 2022-12-03: qty 30

## 2022-12-03 MED ORDER — SODIUM CHLORIDE 0.9 % IV SOLN: Freq: Once | INTRAVENOUS | Status: AC

## 2022-12-03 MED ORDER — METHYLPREDNISOLONE 4 MG PO TBPK: ORAL_TABLET | ORAL | 0 refills | Status: DC

## 2022-12-03 NOTE — Patient Instructions (Signed)
[facility]  Discharge Instructions: Thank you for choosing [facility] to provide your oncology and hematology care.   If you have a lab appointment with the Cancer Center, please go directly to the Cancer Center and check in at the registration area.   Wear comfortable clothing and clothing appropriate for easy access to any Portacath or PICC line.   We strive to give you quality time with your provider. You may need to reschedule your appointment if you arrive late (15 or more minutes).  Arriving late affects you and other patients whose appointments are after yours.  Also, if you miss three or more appointments without notifying the office, you may be dismissed from the clinic at the provider's discretion.      For prescription refill requests, have your pharmacy contact our office and allow 72 hours for refills to be completed.    Today you received the following chemotherapy and/or immunotherapy agents: Imfinzi      To help prevent nausea and vomiting after your treatment, we encourage you to take your nausea medication as directed.  BELOW ARE SYMPTOMS THAT SHOULD BE REPORTED IMMEDIATELY: *FEVER GREATER THAN 100.4 F (38 C) OR HIGHER *CHILLS OR SWEATING *NAUSEA AND VOMITING THAT IS NOT CONTROLLED WITH YOUR NAUSEA MEDICATION *UNUSUAL SHORTNESS OF BREATH *UNUSUAL BRUISING OR BLEEDING *URINARY PROBLEMS (pain or burning when urinating, or frequent urination) *BOWEL PROBLEMS (unusual diarrhea, constipation, pain near the anus) TENDERNESS IN MOUTH AND THROAT WITH OR WITHOUT PRESENCE OF ULCERS (sore throat, sores in mouth, or a toothache) UNUSUAL RASH, SWELLING OR PAIN  UNUSUAL VAGINAL DISCHARGE OR ITCHING   Items with * indicate a potential emergency and should be followed up as soon as possible or go to the Emergency Department if any problems should occur.  Please show the CHEMOTHERAPY ALERT CARD or IMMUNOTHERAPY ALERT CARD at  check-in to the Emergency Department and triage nurse.  Should you have questions after your visit or need to cancel or reschedule your appointment, please contact [facility]  Dept: [phone]  and follow the prompts.  Office hours are 8:00 a.m. to 4:30 p.m. Monday - Friday. Please note that voicemails left after 4:00 p.m. may not be returned until the following business day.  We are closed weekends and major holidays. You have access to a nurse at all times for urgent questions. Please call the main number to the clinic Dept: [phone] and follow the prompts.   For any non-urgent questions, you may also contact your provider using MyChart. We now offer e-Visits for anyone 18 and older to request care online for non-urgent symptoms. For details visit [email or web address].   Also download the MyChart app! Go to the app store, search "MyChart", open the app, select [facility], and log in with your MyChart username and password.   

## 2022-12-03 NOTE — Progress Notes (Signed)
Patient seen by PA today  Vitals are within treatment parameters.  Labs reviewed: and are within treatment parameters.  Per physician team, patient is ready for treatment and there are NO modifications to the treatment plan.

## 2022-12-10 ENCOUNTER — Other Ambulatory Visit: Payer: Self-pay

## 2022-12-25 ENCOUNTER — Ambulatory Visit: Payer: Federal, State, Local not specified - PPO

## 2022-12-25 ENCOUNTER — Other Ambulatory Visit: Payer: Federal, State, Local not specified - PPO

## 2022-12-25 ENCOUNTER — Ambulatory Visit: Payer: Federal, State, Local not specified - PPO | Admitting: Internal Medicine

## 2023-01-01 ENCOUNTER — Other Ambulatory Visit: Payer: Self-pay

## 2023-01-01 ENCOUNTER — Inpatient Hospital Stay: Payer: Federal, State, Local not specified - PPO

## 2023-01-01 ENCOUNTER — Encounter: Payer: Self-pay | Admitting: Medical Oncology

## 2023-01-01 ENCOUNTER — Inpatient Hospital Stay: Payer: Federal, State, Local not specified - PPO | Attending: Internal Medicine | Admitting: Internal Medicine

## 2023-01-01 VITALS — BP 111/83 | HR 74 | Temp 97.8°F | Resp 20

## 2023-01-01 VITALS — BP 138/69 | HR 76 | Temp 97.7°F | Resp 16 | Wt 149.0 lb

## 2023-01-01 DIAGNOSIS — Z79899 Other long term (current) drug therapy: Secondary | ICD-10-CM | POA: Insufficient documentation

## 2023-01-01 DIAGNOSIS — C7951 Secondary malignant neoplasm of bone: Secondary | ICD-10-CM | POA: Insufficient documentation

## 2023-01-01 DIAGNOSIS — K5900 Constipation, unspecified: Secondary | ICD-10-CM | POA: Insufficient documentation

## 2023-01-01 DIAGNOSIS — G6289 Other specified polyneuropathies: Secondary | ICD-10-CM

## 2023-01-01 DIAGNOSIS — Z5112 Encounter for antineoplastic immunotherapy: Secondary | ICD-10-CM | POA: Diagnosis not present

## 2023-01-01 DIAGNOSIS — Z95828 Presence of other vascular implants and grafts: Secondary | ICD-10-CM

## 2023-01-01 DIAGNOSIS — R0602 Shortness of breath: Secondary | ICD-10-CM | POA: Diagnosis not present

## 2023-01-01 DIAGNOSIS — C772 Secondary and unspecified malignant neoplasm of intra-abdominal lymph nodes: Secondary | ICD-10-CM | POA: Diagnosis not present

## 2023-01-01 DIAGNOSIS — C3411 Malignant neoplasm of upper lobe, right bronchus or lung: Secondary | ICD-10-CM

## 2023-01-01 LAB — CBC WITH DIFFERENTIAL (CANCER CENTER ONLY)
Abs Immature Granulocytes: 0.02 10*3/uL (ref 0.00–0.07)
Basophils Absolute: 0 10*3/uL (ref 0.0–0.1)
Basophils Relative: 1 %
Eosinophils Absolute: 0.3 10*3/uL (ref 0.0–0.5)
Eosinophils Relative: 3 %
HCT: 40.2 % (ref 36.0–46.0)
Hemoglobin: 13.9 g/dL (ref 12.0–15.0)
Immature Granulocytes: 0 %
Lymphocytes Relative: 17 %
Lymphs Abs: 1.3 10*3/uL (ref 0.7–4.0)
MCH: 33.3 pg (ref 26.0–34.0)
MCHC: 34.6 g/dL (ref 30.0–36.0)
MCV: 96.2 fL (ref 80.0–100.0)
Monocytes Absolute: 0.6 10*3/uL (ref 0.1–1.0)
Monocytes Relative: 7 %
Neutro Abs: 5.6 10*3/uL (ref 1.7–7.7)
Neutrophils Relative %: 72 %
Platelet Count: 212 10*3/uL (ref 150–400)
RBC: 4.18 MIL/uL (ref 3.87–5.11)
RDW: 12.9 % (ref 11.5–15.5)
WBC Count: 7.8 10*3/uL (ref 4.0–10.5)
nRBC: 0 % (ref 0.0–0.2)

## 2023-01-01 LAB — CMP (CANCER CENTER ONLY)
ALT: 16 U/L (ref 0–44)
AST: 22 U/L (ref 15–41)
Albumin: 3.9 g/dL (ref 3.5–5.0)
Alkaline Phosphatase: 69 U/L (ref 38–126)
Anion gap: 7 (ref 5–15)
BUN: 21 mg/dL (ref 8–23)
CO2: 26 mmol/L (ref 22–32)
Calcium: 9.3 mg/dL (ref 8.9–10.3)
Chloride: 105 mmol/L (ref 98–111)
Creatinine: 0.78 mg/dL (ref 0.44–1.00)
GFR, Estimated: >60 mL/min (ref >60)
Glucose, Bld: 127 mg/dL — ABNORMAL HIGH (ref 70–99)
Potassium: 4 mmol/L (ref 3.5–5.1)
Sodium: 138 mmol/L (ref 135–145)
Total Bilirubin: 0.5 mg/dL (ref 0.3–1.2)
Total Protein: 6.9 g/dL (ref 6.5–8.1)

## 2023-01-01 MED ORDER — HEPARIN SOD (PORK) LOCK FLUSH 100 UNIT/ML IV SOLN
500.0000 [IU] | Freq: Once | INTRAVENOUS | Status: AC | PRN
  Administered 2023-01-01: 500 [IU]

## 2023-01-01 MED ORDER — GABAPENTIN 100 MG PO CAPS: 100.0000 mg | ORAL_CAPSULE | Freq: Two times a day (BID) | ORAL | 2 refills | Status: DC

## 2023-01-01 MED ORDER — SODIUM CHLORIDE 0.9 % IV SOLN
1500.0000 mg | Freq: Once | INTRAVENOUS | Status: AC
  Administered 2023-01-01: 1500 mg via INTRAVENOUS
  Filled 2023-01-01: qty 30

## 2023-01-01 MED ORDER — SODIUM CHLORIDE 0.9% FLUSH
10.0000 mL | INTRAVENOUS | Status: DC | PRN
  Administered 2023-01-01: 10 mL

## 2023-01-01 MED ORDER — SODIUM CHLORIDE 0.9 % IV SOLN: Freq: Once | INTRAVENOUS | Status: AC

## 2023-01-01 NOTE — Progress Notes (Unsigned)
Patient seen by MD today  Vitals are within treatment parameters.  Labs reviewed: and are within treatment parameters.  Per physician team, patient is ready for treatment and there are NO modifications to the treatment plan.  

## 2023-01-01 NOTE — Progress Notes (Signed)
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   PRIOR THERAPY: None   CURRENT THERAPY: Palliative systemic chemotherapy with carboplatin for AUC of 5 on day 1, etoposide 100 mg/M2 on days 1, 2 and 3 as well as Imfinzi 1500 mg every 3 weeks with the chemotherapy and Neulasta support. First dose on 03/16/2019. Status post 50 cycles.   Starting from cycle #5 the patient will be treated with maintenance treatment with Imfinzi 1500 mg IV every 4 weeks.  INTERVAL HISTORY: Brittany Marks 82 y.o. female returns to the clinic today for follow-up visit.  The patient is feeling fine with no concerning complaints except for occasional shortness of breath.  She denied having any chest pain, but continues to have cough with no hemoptysis.  She has no nausea, vomiting, diarrhea or constipation.  She has no headache or visual changes.  She denied having any recent weight loss or night sweats.  She has been tolerating her treatment with maintenance Imfinzi fairly well.  She is here for evaluation before starting cycle #51.   MEDICAL HISTORY: Past Medical History:  Diagnosis Date   Cataract    Bilateral   Constipation    pain medication   Left rotator cuff tear    Right rotator cuff tear    SCL CA dx'd 11/2018    ALLERGIES:  is allergic to oxycodone.  MEDICATIONS:  Current Outpatient Medications  Medication Sig Dispense Refill   albuterol (VENTOLIN HFA) 108 (90 Base) MCG/ACT inhaler Inhale 2 puffs into the lungs every 6 (six) hours as needed for wheezing or shortness of breath. 8 g 2   Ascorbic Acid (VITAMIN C PO) Take  by mouth daily.      b complex vitamins tablet Take 1 tablet by mouth daily.     COD LIVER OIL PO Take 1 tablet by mouth daily.      gabapentin (NEURONTIN) 100 MG capsule Take 1 capsule (100 mg total) by mouth 2 (two) times daily. 90 capsule 2   hydrocortisone 2.5 % cream Apply topically as needed. (Patient taking differently: Apply 1 application  topically daily as needed (Treatment).) 3.5 g 0   ibuprofen (ADVIL) 200 MG tablet Take 600 mg by mouth daily.     lidocaine-prilocaine (EMLA) cream Apply 1 Application topically every 30 (thirty) days. 30 g 2   methylPREDNISolone (MEDROL DOSEPAK) 4 MG TBPK tablet Use as instructed 21 tablet 0   Multiple Vitamin (MULTIVITAMIN) tablet Take 1 tablet by mouth daily.     ondansetron (ZOFRAN) 4 MG tablet Take 1 tablet (4 mg total) by mouth every 8 (eight) hours as needed for nausea or vomiting. 10 tablet 0   oxymetazoline (AFRIN) 0.05 % nasal spray Place 2 sprays into the nose 2 (two) times daily as needed (Bloody nose).     prochlorperazine (COMPAZINE) 10 MG tablet Take 1 tablet (10 mg total) by mouth every 6 (six) hours as needed for nausea or vomiting. 30 tablet 0   VITAMIN E PO Take 1 tablet by mouth daily.      No current facility-administered medications for this visit.   Facility-Administered Medications Ordered  in Other Visits  Medication Dose Route Frequency Provider Last Rate Last Admin   heparin lock flush 100 unit/mL  500 Units Intracatheter Once PRN Magrinat, Virgie Dad, MD       sodium chloride flush (NS) 0.9 % injection 10 mL  10 mL Intracatheter PRN Magrinat, Virgie Dad, MD       sodium chloride flush (NS) 0.9 % injection 10 mL  10 mL Intracatheter PRN Curt Bears, MD   10 mL at 01/01/23 1126    SURGICAL HISTORY:  Past Surgical History:  Procedure Laterality Date   CATARACT EXTRACTION W/ INTRAOCULAR LENS  IMPLANT, BILATERAL  2012   DECOMPRESSIVE LUMBAR LAMINECTOMY LEVEL 2 N/A 07/29/2013   Procedure: LUMBAR LAMINECTOMY, DECOMPRESSION  LUMBAR THREE TO FOUR, FOUR TO FIVE microdiscectomy l3,4 right;  Surgeon: Tobi Bastos, MD;  Location: [facility];  Service: Orthopedics;  Laterality: N/A;   I & D SUPERIOR RIGHT SHOULDER AND CLOSURE WOUND  01-10-2011   S/P ROTATOR CUFF REPAIR   IR IMAGING GUIDED PORT INSERTION  04/09/2019   LUMBAR LAMINECTOMY  1970'S   LUMBAR LAMINECTOMY/DECOMPRESSION MICRODISCECTOMY N/A 06/27/2016   Procedure: L1 - L2 DISCECTOMY;  Surgeon: Melina Schools, MD;  Location: [facility];  Service: Orthopedics;  Laterality: N/A;   LUMBAR SPINE SURGERY  1983   REVERSE SHOULDER ARTHROPLASTY Left 03/10/2020   Procedure: REVERSE SHOULDER ARTHROPLASTY;  Surgeon: Justice Britain, MD;  Location: [facility];  Service: Orthopedics;  Laterality: Left;  123min   RIGHT SHOULDER ARTHROSCOPY/ OPEN DISTAL CLAVICLE RESECTION/ SAD/ OPEN ROTATOR CUFF REPAIR  11-28-2010   SHOULDER FUSION SURGERY  03/11/2020   Shoulder Surgery , hardware placed   SHOULDER OPEN ROTATOR CUFF REPAIR  12/19/2011   Procedure: ROTATOR CUFF REPAIR SHOULDER OPEN;  Surgeon: Magnus Sinning, MD;  Location: [facility];  Service: Orthopedics;  Laterality: Right;  RIGHT RECURRENT OPEN REPAIR OF THE ROTATOR CUFF WITH TISSUE MEND GRAFTANTERIOR CHROMIOECTOMY   VAGINAL HYSTERECTOMY  1979    REVIEW OF SYSTEMS:  A comprehensive review of systems was negative except for: Constitutional: positive for fatigue Respiratory: positive for dyspnea on exertion   PHYSICAL EXAMINATION: General appearance: alert, cooperative, fatigued, and no distress Head: Normocephalic, without obvious abnormality, atraumatic Neck: no adenopathy, no JVD, supple, symmetrical, trachea midline, and thyroid not enlarged, symmetric, no tenderness/mass/nodules Lymph nodes: Cervical, supraclavicular, and axillary nodes normal. Resp: clear to auscultation bilaterally Back: symmetric, no curvature. ROM normal. No CVA tenderness. Cardio: regular rate and rhythm, S1, S2 normal, no murmur, click, rub or  gallop GI: soft, non-tender; bowel sounds normal; no masses,  no organomegaly Extremities: extremities normal, atraumatic, no cyanosis or edema   ECOG PERFORMANCE STATUS: 1 - Symptomatic but completely ambulatory  Blood pressure 138/69, pulse 76, temperature 97.7 F (36.5 C), temperature source Oral, resp. rate 16, weight 149 lb (67.6 kg), SpO2 91 %.  LABORATORY DATA: Lab Results  Component Value Date   WBC 9.2 12/03/2022   HGB 13.1 12/03/2022   HCT 37.7 12/03/2022   MCV 95.4 12/03/2022   PLT 245 12/03/2022      Chemistry      Component Value Date/Time   NA 133 (L) 12/03/2022 1112   K 3.9 12/03/2022 1112   CL 101 12/03/2022 1112   CO2 26 12/03/2022 1112   BUN 19 12/03/2022 1112   CREATININE 0.68 12/03/2022 1112      Component Value Date/Time   CALCIUM 8.7 (L) 12/03/2022 1112   ALKPHOS 78 12/03/2022 1112   AST 20 12/03/2022 1112  ALT 14 12/03/2022 1112   BILITOT 0.4 12/03/2022 1112       RADIOGRAPHIC STUDIES: No results found.   ASSESSMENT AND PLAN: This is a very pleasant 82 years old white female with extensive stage small cell lung cancer and she is currently undergoing treatment with carboplatin, etoposide and Imfinzi status post 5 cycles.  Starting from cycle #6 the patient is on maintenance treatment with single agent Imfinzi every 4 weeks status post 50 cycles of total treatment. The patient has been tolerating this treatment well with no concerning adverse effects. I recommended for her to proceed with cycle #51 today as planned. For the peripheral neuropathy, she will continue her current treatment with gabapentin and I will give her a refill of her medication. She will come back for follow-up visit in 4 weeks for evaluation before starting cycle #52. The patient was advised to call immediately if she has any other concerning symptoms in the interval. The patient voices understanding of current disease status and treatment options and is in agreement with the  current care plan.  All questions were answered. The patient knows to call the clinic with any problems, questions or concerns. We can certainly see the patient much sooner if necessary. The total time spent in the appointment was 20 minutes.  Disclaimer: This note was dictated with voice recognition software. Similar sounding words can inadvertently be transcribed and may not be corrected upon review.

## 2023-01-01 NOTE — Patient Instructions (Signed)
[facility]  Discharge Instructions: Thank you for choosing [facility] to provide your oncology and hematology care.   If you have a lab appointment with the Cancer Center, please go directly to the Cancer Center and check in at the registration area.   Wear comfortable clothing and clothing appropriate for easy access to any Portacath or PICC line.   We strive to give you quality time with your provider. You may need to reschedule your appointment if you arrive late (15 or more minutes).  Arriving late affects you and other patients whose appointments are after yours.  Also, if you miss three or more appointments without notifying the office, you may be dismissed from the clinic at the provider's discretion.      For prescription refill requests, have your pharmacy contact our office and allow 72 hours for refills to be completed.    Today you received the following chemotherapy and/or immunotherapy agents: Imfinzi      To help prevent nausea and vomiting after your treatment, we encourage you to take your nausea medication as directed.  BELOW ARE SYMPTOMS THAT SHOULD BE REPORTED IMMEDIATELY: *FEVER GREATER THAN 100.4 F (38 C) OR HIGHER *CHILLS OR SWEATING *NAUSEA AND VOMITING THAT IS NOT CONTROLLED WITH YOUR NAUSEA MEDICATION *UNUSUAL SHORTNESS OF BREATH *UNUSUAL BRUISING OR BLEEDING *URINARY PROBLEMS (pain or burning when urinating, or frequent urination) *BOWEL PROBLEMS (unusual diarrhea, constipation, pain near the anus) TENDERNESS IN MOUTH AND THROAT WITH OR WITHOUT PRESENCE OF ULCERS (sore throat, sores in mouth, or a toothache) UNUSUAL RASH, SWELLING OR PAIN  UNUSUAL VAGINAL DISCHARGE OR ITCHING   Items with * indicate a potential emergency and should be followed up as soon as possible or go to the Emergency Department if any problems should occur.  Please show the CHEMOTHERAPY ALERT CARD or IMMUNOTHERAPY ALERT CARD at  check-in to the Emergency Department and triage nurse.  Should you have questions after your visit or need to cancel or reschedule your appointment, please contact [facility]  Dept: [phone]  and follow the prompts.  Office hours are 8:00 a.m. to 4:30 p.m. Monday - Friday. Please note that voicemails left after 4:00 p.m. may not be returned until the following business day.  We are closed weekends and major holidays. You have access to a nurse at all times for urgent questions. Please call the main number to the clinic Dept: [phone] and follow the prompts.   For any non-urgent questions, you may also contact your provider using MyChart. We now offer e-Visits for anyone 18 and older to request care online for non-urgent symptoms. For details visit [email or web address].   Also download the MyChart app! Go to the app store, search "MyChart", open the app, select [facility], and log in with your MyChart username and password.   

## 2023-01-17 ENCOUNTER — Telehealth: Payer: Self-pay | Admitting: Pharmacist

## 2023-01-22 ENCOUNTER — Other Ambulatory Visit: Payer: Federal, State, Local not specified - PPO

## 2023-01-22 ENCOUNTER — Ambulatory Visit: Payer: Federal, State, Local not specified - PPO | Admitting: Internal Medicine

## 2023-01-22 ENCOUNTER — Ambulatory Visit: Payer: Federal, State, Local not specified - PPO

## 2023-01-24 NOTE — Progress Notes (Signed)
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     PRIOR THERAPY: 1) Palliative radiotherapy to the left proximal humerus under the care of Dr. Mitzi Hansen. 2) Additional palliative radiotherapy to the proximal humerus under the care of Dr. Mitzi Hansen. Last treatment on 04/21/2020  CURRENT THERAPY: Palliative systemic chemotherapy with carboplatin for AUC of 5 on day 1, etoposide 100 mg/M2 on days 1, 2 and 3 as well as Imfinzi 1500 mg every 3 weeks with the chemotherapy and Neulasta support. First dose on 03/16/2019. Status post 51 cycles. Starting from cycle #5 the patient will be treated with maintenance treatment with Imfinzi 1500 mg IV every 4 weeks.    INTERVAL HISTORY: Brittany Marks 82 y.o. female returns to the clinic today for a follow-up visit.  The patient is feeling fine today without any concerning complaints. She denies any changes in her health today. She reports her baseline fatigue. The patient denies any recent fever, chills, or night sweats, or weight loss. She reports her baseline dyspnea on exertion depending on the activity. She reports her chronic cough, mostly at night which is stable. She denies any chest pain or hemoptysis.  She denies any nausea, vomiting, diarrhea, or constipation. She denies any headaches or visual changes. She denies any rashes.  She still has limited mobility in her left shoulder where she previously had radiation and surgery on the metastatic bone lesion in 2020. She is here today for evaluation and repeat blood work before starting cycle #52    MEDICAL HISTORY: Past Medical History:  Diagnosis Date   Cataract    Bilateral    Constipation    pain medication   Left rotator cuff tear    Right rotator cuff tear    SCL CA dx'd 11/2018    ALLERGIES:  is allergic to oxycodone.  MEDICATIONS:  Current Outpatient Medications  Medication Sig Dispense Refill   albuterol (VENTOLIN HFA) 108 (90 Base) MCG/ACT inhaler Inhale 2 puffs into the lungs every 6 (six) hours as needed for wheezing or shortness of breath. 8 g 2   Ascorbic Acid (VITAMIN C PO) Take by mouth daily.      b complex vitamins tablet Take 1 tablet by mouth daily.     COD LIVER OIL PO Take 1 tablet by mouth daily.      gabapentin (NEURONTIN) 100 MG capsule Take 1 capsule (100 mg total) by mouth 2 (two) times daily. 90 capsule 2   hydrocortisone 2.5 % cream Apply topically as needed. (Patient taking differently: Apply 1 application  topically daily as needed (Treatment).) 3.5 g 0   ibuprofen (ADVIL) 200 MG tablet Take 600 mg by mouth daily.     lidocaine-prilocaine (EMLA) cream Apply 1 Application topically every 30 (thirty) days. 30 g 2   methylPREDNISolone (MEDROL DOSEPAK) 4 MG TBPK tablet Use as instructed 21 tablet 0   Multiple Vitamin (MULTIVITAMIN) tablet Take 1 tablet by mouth daily.     ondansetron (ZOFRAN) 4 MG tablet Take 1 tablet (4 mg total) by mouth every 8 (eight) hours as needed for nausea or vomiting. 10 tablet 0   oxymetazoline (AFRIN) 0.05 % nasal spray Place 2 sprays into the nose 2 (  two) times daily as needed (Bloody nose).     prochlorperazine (COMPAZINE) 10 MG tablet Take 1 tablet (10 mg total) by mouth every 6 (six) hours as needed for nausea or vomiting. 30 tablet 0   VITAMIN E PO Take 1 tablet by mouth daily.      No current facility-administered medications for this visit.   Facility-Administered Medications Ordered in Other Visits  Medication Dose Route Frequency Provider Last Rate Last Admin   durvalumab (IMFINZI) 1,500 mg in sodium chloride 0.9 % 100 mL chemo infusion  1,500 mg Intravenous Once Si Gaul, MD       heparin  lock flush 100 unit/mL  500 Units Intracatheter Once PRN Magrinat, Valentino Hue, MD       heparin lock flush 100 unit/mL  500 Units Intracatheter Once PRN Si Gaul, MD       sodium chloride flush (NS) 0.9 % injection 10 mL  10 mL Intracatheter PRN Magrinat, Valentino Hue, MD       sodium chloride flush (NS) 0.9 % injection 10 mL  10 mL Intracatheter PRN Si Gaul, MD        SURGICAL HISTORY:  Past Surgical History:  Procedure Laterality Date   CATARACT EXTRACTION W/ INTRAOCULAR LENS  IMPLANT, BILATERAL  2012   DECOMPRESSIVE LUMBAR LAMINECTOMY LEVEL 2 N/A 07/29/2013   Procedure: LUMBAR LAMINECTOMY, DECOMPRESSION LUMBAR THREE TO FOUR, FOUR TO FIVE microdiscectomy l3,4 right;  Surgeon: Jacki Cones, MD;  Location: [facility];  Service: Orthopedics;  Laterality: N/A;   I & D SUPERIOR RIGHT SHOULDER AND CLOSURE WOUND  01-10-2011   S/P ROTATOR CUFF REPAIR   IR IMAGING GUIDED PORT INSERTION  04/09/2019   LUMBAR LAMINECTOMY  1970'S   LUMBAR LAMINECTOMY/DECOMPRESSION MICRODISCECTOMY N/A 06/27/2016   Procedure: L1 - L2 DISCECTOMY;  Surgeon: Venita Lick, MD;  Location: [facility];  Service: Orthopedics;  Laterality: N/A;   LUMBAR SPINE SURGERY  1983   REVERSE SHOULDER ARTHROPLASTY Left 03/10/2020   Procedure: REVERSE SHOULDER ARTHROPLASTY;  Surgeon: Francena Hanly, MD;  Location: [facility];  Service: Orthopedics;  Laterality: Left;    RIGHT SHOULDER ARTHROSCOPY/ OPEN DISTAL CLAVICLE RESECTION/ SAD/ OPEN ROTATOR CUFF REPAIR  11-28-2010   SHOULDER FUSION SURGERY  03/11/2020   Shoulder Surgery , hardware placed   SHOULDER OPEN ROTATOR CUFF REPAIR  12/19/2011   Procedure: ROTATOR CUFF REPAIR SHOULDER OPEN;  Surgeon: Drucilla Schmidt, MD;  Location: [facility];  Service: Orthopedics;  Laterality: Right;  RIGHT RECURRENT OPEN REPAIR OF THE ROTATOR CUFF WITH TISSUE MEND GRAFTANTERIOR CHROMIOECTOMY   VAGINAL HYSTERECTOMY  1979    REVIEW OF SYSTEMS:   Constitutional: Positive for fatigue.  Negative for appetite change, chills, fever and unexpected weight change.  HENT: Negative for mouth sores, nosebleeds, sore throat and trouble swallowing.  Eyes: Negative for eye problems and icterus.  Respiratory: Positive for stable dyspnea on exertion and chronic cough (mostly at night). Negative for hemoptysis and wheezing.   Cardiovascular: Negative for chest pain and leg swelling.  Gastrointestinal: Negative for abdominal pain, recent diarrhea, constipation, diarrhea, nausea and vomiting.  Genitourinary: Negative for bladder incontinence, difficulty urinating, dysuria, frequency and hematuria.   Musculoskeletal: Positive for chronic back pain. Negative for gait problem, neck pain and neck stiffness.  Skin: Negative for itching and rash.  Neurological: Positive for peripheral neuropathy. Negative for dizziness, extremity weakness, gait problem, headaches, light-headedness and seizures.  Hematological: Negative for adenopathy. Does not bruise/bleed easily.  Psychiatric/Behavioral: Negative for confusion, depression and sleep  disturbance. The patient is not nervous/anxious.     PHYSICAL EXAMINATION:  Blood pressure 135/79, pulse 76, temperature 97.6 F (36.4 C), temperature source Oral, resp. rate 15, weight 150 lb 9.6 oz (68.3 kg), SpO2 91 %.  ECOG PERFORMANCE STATUS: 2-3  Physical Exam  Constitutional: Oriented to person, place, and time and chronically ill appearing female and in no distress.  HENT:  Head: Normocephalic and atraumatic.  Mouth/Throat: Oropharynx is clear and moist. No oropharyngeal exudate.  Eyes: Conjunctivae are normal. Right eye exhibits no discharge. Left eye exhibits no discharge. No scleral icterus.  Neck: Normal range of motion. Neck supple.  Cardiovascular: Normal rate, regular rhythm, normal heart sounds.   Pulmonary/Chest: Effort normal and breath sounds normal. No respiratory distress.  Abdominal: Soft. Exhibits no distension and no mass. There is no  tenderness.  Musculoskeletal: Normal range of motion. Exhibits no edema.  Lymphadenopathy:    No cervical adenopathy.  Neurological: Alert and oriented to person, place, and time. Exhibits muscle wasting. Examined in the wheelchair.  Skin: Skin is warm and dry. No rash noted. Not diaphoretic. No erythema. No pallor.  Psychiatric: Mood, memory and judgment normal.  Vitals reviewed.  LABORATORY DATA: Lab Results  Component Value Date   WBC 8.4 01/28/2023   HGB 13.6 01/28/2023   HCT 39.8 01/28/2023   MCV 96.8 01/28/2023   PLT 249 01/28/2023      Chemistry      Component Value Date/Time   NA 135 01/28/2023 1501   K 4.2 01/28/2023 1501   CL 102 01/28/2023 1501   CO2 25 01/28/2023 1501   BUN 22 01/28/2023 1501   CREATININE 0.72 01/28/2023 1501      Component Value Date/Time   CALCIUM 9.5 01/28/2023 1501   ALKPHOS 80 01/28/2023 1501   AST 22 01/28/2023 1501   ALT 15 01/28/2023 1501   BILITOT 0.4 01/28/2023 1501       RADIOGRAPHIC STUDIES:  No results found.   ASSESSMENT/PLAN:  This is a very pleasant 82 year old Caucasian female diagnosed with extensive stage (T1b, N3, M1 C) small cell lung cancer.  She presented with right upper lobe pulmonary nodules in addition to bilateral hilar and right mediastinal as well as left supraclavicular and abdominal lymphadenopathy.  She also has metastatic disease to the in the left humerus.  She was diagnosed in May 2020.   She is status post palliative radiotherapy to the left proximal humerus under the care of Dr. Mitzi Hansen in 2020.   She is currently undergoing palliative systemic chemotherapy with carboplatin for AUC of 5 on day 1, etoposide 100 mg/M2 on days 1, 2 and 3 as well as Imfinzi 1500 mg every 3 weeks with the chemotherapy and Neulasta support. Status post 451 cycles. Starting from cycle #5 the patient has been treated with maintenance treatment with Imfinzi 1500 mg IV every 4 weeks.   She had a pathological fracture in the left  humerus underwent additional radiation to this area. Last treatment was 04/21/20.    Labs were reviewed.  Recommend that she proceed with cycle #52 today as scheduled.    We will see her back for follow-up visit in 4 weeks for evaluation before starting cycle #53   I will arrange for a restaging CT scan of the chest, abdomen, and pelvis prior to her next cycle of treatment.   The patient was advised to call immediately if she has any concerning symptoms in the interval. The patient voices understanding of current disease status and  treatment options and is in agreement with the current care plan. All questions were answered. The patient knows to call the clinic with any problems, questions or concerns. We can certainly see the patient much sooner if necessary    Orders Placed This Encounter  Procedures   CT CHEST ABDOMEN PELVIS W CONTRAST    Standing Status:   Future    Standing Expiration Date:   01/28/2024    Order Specific Question:   If indicated for the ordered procedure, I authorize the administration of contrast media per Radiology protocol    Answer:   Yes    Order Specific Question:   Does the patient have a contrast media/X-ray dye allergy?    Answer:   No    Order Specific Question:   Preferred imaging location?    Answer:   [facility]    Order Specific Question:   If indicated for the ordered procedure, I authorize the administration of oral contrast media per Radiology protocol    Answer:   Yes     The total time spent in the appointment was 20-29 minutes  Hazael Olveda L Besan Ketchem, PA-C 01/28/23

## 2023-01-26 ENCOUNTER — Other Ambulatory Visit: Payer: Self-pay

## 2023-01-28 ENCOUNTER — Ambulatory Visit: Payer: Federal, State, Local not specified - PPO

## 2023-01-28 ENCOUNTER — Inpatient Hospital Stay: Payer: Federal, State, Local not specified - PPO

## 2023-01-28 ENCOUNTER — Other Ambulatory Visit: Payer: Federal, State, Local not specified - PPO

## 2023-01-28 ENCOUNTER — Other Ambulatory Visit: Payer: Self-pay

## 2023-01-28 ENCOUNTER — Ambulatory Visit: Payer: Federal, State, Local not specified - PPO | Admitting: Internal Medicine

## 2023-01-28 ENCOUNTER — Inpatient Hospital Stay: Payer: Federal, State, Local not specified - PPO | Attending: Internal Medicine

## 2023-01-28 ENCOUNTER — Inpatient Hospital Stay (HOSPITAL_BASED_OUTPATIENT_CLINIC_OR_DEPARTMENT_OTHER): Payer: Federal, State, Local not specified - PPO | Admitting: Physician Assistant

## 2023-01-28 VITALS — BP 109/76 | HR 76 | Resp 16

## 2023-01-28 VITALS — BP 135/79 | HR 76 | Temp 97.6°F | Resp 15 | Wt 150.6 lb

## 2023-01-28 DIAGNOSIS — C7951 Secondary malignant neoplasm of bone: Secondary | ICD-10-CM | POA: Diagnosis not present

## 2023-01-28 DIAGNOSIS — R0602 Shortness of breath: Secondary | ICD-10-CM | POA: Diagnosis not present

## 2023-01-28 DIAGNOSIS — Z5112 Encounter for antineoplastic immunotherapy: Secondary | ICD-10-CM | POA: Diagnosis not present

## 2023-01-28 DIAGNOSIS — C3411 Malignant neoplasm of upper lobe, right bronchus or lung: Secondary | ICD-10-CM

## 2023-01-28 DIAGNOSIS — K5900 Constipation, unspecified: Secondary | ICD-10-CM | POA: Insufficient documentation

## 2023-01-28 DIAGNOSIS — Z79899 Other long term (current) drug therapy: Secondary | ICD-10-CM | POA: Diagnosis not present

## 2023-01-28 DIAGNOSIS — Z95828 Presence of other vascular implants and grafts: Secondary | ICD-10-CM

## 2023-01-28 LAB — CBC WITH DIFFERENTIAL (CANCER CENTER ONLY)
Abs Immature Granulocytes: 0.02 10*3/uL (ref 0.00–0.07)
Basophils Absolute: 0.1 10*3/uL (ref 0.0–0.1)
Basophils Relative: 1 %
Eosinophils Absolute: 0.3 10*3/uL (ref 0.0–0.5)
Eosinophils Relative: 4 %
HCT: 39.8 % (ref 36.0–46.0)
Hemoglobin: 13.6 g/dL (ref 12.0–15.0)
Immature Granulocytes: 0 %
Lymphocytes Relative: 17 %
Lymphs Abs: 1.4 10*3/uL (ref 0.7–4.0)
MCH: 33.1 pg (ref 26.0–34.0)
MCHC: 34.2 g/dL (ref 30.0–36.0)
MCV: 96.8 fL (ref 80.0–100.0)
Monocytes Absolute: 0.7 10*3/uL (ref 0.1–1.0)
Monocytes Relative: 8 %
Neutro Abs: 6 10*3/uL (ref 1.7–7.7)
Neutrophils Relative %: 70 %
Platelet Count: 249 10*3/uL (ref 150–400)
RBC: 4.11 MIL/uL (ref 3.87–5.11)
RDW: 13.4 % (ref 11.5–15.5)
WBC Count: 8.4 10*3/uL (ref 4.0–10.5)
nRBC: 0 % (ref 0.0–0.2)

## 2023-01-28 LAB — CMP (CANCER CENTER ONLY)
ALT: 15 U/L (ref 0–44)
AST: 22 U/L (ref 15–41)
Albumin: 3.9 g/dL (ref 3.5–5.0)
Alkaline Phosphatase: 80 U/L (ref 38–126)
Anion gap: 8 (ref 5–15)
BUN: 22 mg/dL (ref 8–23)
CO2: 25 mmol/L (ref 22–32)
Calcium: 9.5 mg/dL (ref 8.9–10.3)
Chloride: 102 mmol/L (ref 98–111)
Creatinine: 0.72 mg/dL (ref 0.44–1.00)
GFR, Estimated: >60 mL/min (ref >60)
Glucose, Bld: 99 mg/dL (ref 70–99)
Potassium: 4.2 mmol/L (ref 3.5–5.1)
Sodium: 135 mmol/L (ref 135–145)
Total Bilirubin: 0.4 mg/dL (ref 0.3–1.2)
Total Protein: 7.3 g/dL (ref 6.5–8.1)

## 2023-01-28 MED ORDER — HEPARIN SOD (PORK) LOCK FLUSH 100 UNIT/ML IV SOLN
500.0000 [IU] | Freq: Once | INTRAVENOUS | Status: AC | PRN
  Administered 2023-01-28: 500 [IU]

## 2023-01-28 MED ORDER — SODIUM CHLORIDE 0.9% FLUSH
10.0000 mL | INTRAVENOUS | Status: DC | PRN
  Administered 2023-01-28: 10 mL

## 2023-01-28 MED ORDER — SODIUM CHLORIDE 0.9 % IV SOLN
1500.0000 mg | Freq: Once | INTRAVENOUS | Status: AC
  Administered 2023-01-28: 1500 mg via INTRAVENOUS
  Filled 2023-01-28: qty 30

## 2023-01-28 MED ORDER — SODIUM CHLORIDE 0.9 % IV SOLN: Freq: Once | INTRAVENOUS | Status: AC

## 2023-01-28 NOTE — Patient Instructions (Signed)
[facility]  Discharge Instructions: Thank you for choosing [facility] to provide your oncology and hematology care.   If you have a lab appointment with the Cancer Center, please go directly to the Cancer Center and check in at the registration area.   Wear comfortable clothing and clothing appropriate for easy access to any Portacath or PICC line.   We strive to give you quality time with your provider. You may need to reschedule your appointment if you arrive late (15 or more minutes).  Arriving late affects you and other patients whose appointments are after yours.  Also, if you miss three or more appointments without notifying the office, you may be dismissed from the clinic at the provider's discretion.      For prescription refill requests, have your pharmacy contact our office and allow 72 hours for refills to be completed.    Today you received the following chemotherapy and/or immunotherapy agents: Imfinzi      To help prevent nausea and vomiting after your treatment, we encourage you to take your nausea medication as directed.  BELOW ARE SYMPTOMS THAT SHOULD BE REPORTED IMMEDIATELY: *FEVER GREATER THAN 100.4 F (38 C) OR HIGHER *CHILLS OR SWEATING *NAUSEA AND VOMITING THAT IS NOT CONTROLLED WITH YOUR NAUSEA MEDICATION *UNUSUAL SHORTNESS OF BREATH *UNUSUAL BRUISING OR BLEEDING *URINARY PROBLEMS (pain or burning when urinating, or frequent urination) *BOWEL PROBLEMS (unusual diarrhea, constipation, pain near the anus) TENDERNESS IN MOUTH AND THROAT WITH OR WITHOUT PRESENCE OF ULCERS (sore throat, sores in mouth, or a toothache) UNUSUAL RASH, SWELLING OR PAIN  UNUSUAL VAGINAL DISCHARGE OR ITCHING   Items with * indicate a potential emergency and should be followed up as soon as possible or go to the Emergency Department if any problems should occur.  Please show the CHEMOTHERAPY ALERT CARD or IMMUNOTHERAPY ALERT CARD at  check-in to the Emergency Department and triage nurse.  Should you have questions after your visit or need to cancel or reschedule your appointment, please contact [facility]  Dept: [phone]  and follow the prompts.  Office hours are 8:00 a.m. to 4:30 p.m. Monday - Friday. Please note that voicemails left after 4:00 p.m. may not be returned until the following business day.  We are closed weekends and major holidays. You have access to a nurse at all times for urgent questions. Please call the main number to the clinic Dept: [phone] and follow the prompts.   For any non-urgent questions, you may also contact your provider using MyChart. We now offer e-Visits for anyone 18 and older to request care online for non-urgent symptoms. For details visit [email or web address].   Also download the MyChart app! Go to the app store, search "MyChart", open the app, select [facility], and log in with your MyChart username and password.   

## 2023-01-29 LAB — TSH: TSH: 2.324 u[IU]/mL (ref 0.350–4.500)

## 2023-01-30 ENCOUNTER — Other Ambulatory Visit: Payer: Self-pay

## 2023-02-20 NOTE — Progress Notes (Deleted)
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     PRIOR THERAPY: 1) Palliative radiotherapy to the left proximal humerus under the care of Dr. Mitzi Hansen. 2) Additional palliative radiotherapy to the proximal humerus under the care of Dr. Mitzi Hansen. Last treatment on 04/21/2020  CURRENT THERAPY: Palliative systemic chemotherapy with carboplatin for AUC of 5 on day 1, etoposide 100 mg/M2 on days 1, 2 and 3 as well as Imfinzi 1500 mg every 3 weeks with the chemotherapy and Neulasta support. First dose on 03/16/2019. Status post 52 cycles. Starting from cycle #5 the patient will be treated with maintenance treatment with Imfinzi 1500 mg IV every 4 weeks.    INTERVAL HISTORY: Brittany Marks 82 y.o. female returns to the clinic today for a follow-up visit.  The patient is feeling fine today without any concerning complaints. She denies any changes in her health today. She reports her baseline fatigue. The patient denies any recent fever, chills, or night sweats, or weight loss. She reports her baseline dyspnea on exertion depending on the activity. She reports her chronic cough, mostly at night which is stable. She denies any chest pain or hemoptysis.  She denies any nausea, vomiting, diarrhea, or constipation. She denies any headaches or visual changes. She denies any rashes.  She still has limited mobility in her left shoulder where she previously had radiation and surgery on the metastatic bone lesion in 2020. She is supposed to have a restaging scan but it is not scheduled until 03/22/23. She is here today for evaluation and repeat blood work before starting cycle #53      MEDICAL  HISTORY: Past Medical History:  Diagnosis Date   Cataract    Bilateral   Constipation    pain medication   Left rotator cuff tear    Right rotator cuff tear    SCL CA dx'd 11/2018    ALLERGIES:  is allergic to oxycodone.  MEDICATIONS:  Current Outpatient Medications  Medication Sig Dispense Refill   albuterol (VENTOLIN HFA) 108 (90 Base) MCG/ACT inhaler Inhale 2 puffs into the lungs every 6 (six) hours as needed for wheezing or shortness of breath. 8 g 2   Ascorbic Acid (VITAMIN C PO) Take by mouth daily.      b complex vitamins tablet Take 1 tablet by mouth daily.     COD LIVER OIL PO Take 1 tablet by mouth daily.      gabapentin (NEURONTIN) 100 MG capsule Take 1 capsule (100 mg total) by mouth 2 (two) times daily. 90 capsule 2   hydrocortisone 2.5 % cream Apply topically as needed. (Patient taking differently: Apply 1 application  topically daily as needed (Treatment).) 3.5 g 0   ibuprofen (ADVIL) 200 MG tablet Take 600 mg by mouth daily.     lidocaine-prilocaine (EMLA) cream Apply 1 Application topically every 30 (thirty) days. 30 g 2   methylPREDNISolone (MEDROL DOSEPAK) 4 MG TBPK tablet Use as instructed 21 tablet 0   Multiple Vitamin (MULTIVITAMIN) tablet Take 1 tablet by mouth daily.     ondansetron (ZOFRAN) 4 MG tablet Take 1 tablet (4 mg total) by mouth every 8 (eight) hours as needed for nausea or vomiting. 10  tablet 0   oxymetazoline (AFRIN) 0.05 % nasal spray Place 2 sprays into the nose 2 (two) times daily as needed (Bloody nose).     prochlorperazine (COMPAZINE) 10 MG tablet Take 1 tablet (10 mg total) by mouth every 6 (six) hours as needed for nausea or vomiting. 30 tablet 0   VITAMIN E PO Take 1 tablet by mouth daily.      No current facility-administered medications for this visit.   Facility-Administered Medications Ordered in Other Visits  Medication Dose Route Frequency Provider Last Rate Last Admin   heparin lock flush 100 unit/mL  500 Units Intracatheter Once  PRN Magrinat, Valentino Hue, MD       sodium chloride flush (NS) 0.9 % injection 10 mL  10 mL Intracatheter PRN Magrinat, Valentino Hue, MD        SURGICAL HISTORY:  Past Surgical History:  Procedure Laterality Date   CATARACT EXTRACTION W/ INTRAOCULAR LENS  IMPLANT, BILATERAL  2012   DECOMPRESSIVE LUMBAR LAMINECTOMY LEVEL 2 N/A 07/29/2013   Procedure: LUMBAR LAMINECTOMY, DECOMPRESSION LUMBAR THREE TO FOUR, FOUR TO FIVE microdiscectomy l3,4 right;  Surgeon: Jacki Cones, MD;  Location: [facility];  Service: Orthopedics;  Laterality: N/A;   I & D SUPERIOR RIGHT SHOULDER AND CLOSURE WOUND  01-10-2011   S/P ROTATOR CUFF REPAIR   IR IMAGING GUIDED PORT INSERTION  04/09/2019   LUMBAR LAMINECTOMY  1970'S   LUMBAR LAMINECTOMY/DECOMPRESSION MICRODISCECTOMY N/A 06/27/2016   Procedure: L1 - L2 DISCECTOMY;  Surgeon: Venita Lick, MD;  Location: [facility];  Service: Orthopedics;  Laterality: N/A;   LUMBAR SPINE SURGERY  1983   REVERSE SHOULDER ARTHROPLASTY Left 03/10/2020   Procedure: REVERSE SHOULDER ARTHROPLASTY;  Surgeon: Francena Hanly, MD;  Location: [facility];  Service: Orthopedics;  Laterality: Left;    RIGHT SHOULDER ARTHROSCOPY/ OPEN DISTAL CLAVICLE RESECTION/ SAD/ OPEN ROTATOR CUFF REPAIR  11-28-2010   SHOULDER FUSION SURGERY  03/11/2020   Shoulder Surgery , hardware placed   SHOULDER OPEN ROTATOR CUFF REPAIR  12/19/2011   Procedure: ROTATOR CUFF REPAIR SHOULDER OPEN;  Surgeon: Drucilla Schmidt, MD;  Location: [facility];  Service: Orthopedics;  Laterality: Right;  RIGHT RECURRENT OPEN REPAIR OF THE ROTATOR CUFF WITH TISSUE MEND GRAFTANTERIOR CHROMIOECTOMY   VAGINAL HYSTERECTOMY  1979    REVIEW OF SYSTEMS:   Review of Systems  Constitutional: Negative for appetite change, chills, fatigue, fever and unexpected weight change.  HENT:   Negative for mouth sores, nosebleeds, sore throat and trouble swallowing.   Eyes: Negative for eye problems and icterus.  Respiratory: Negative for cough,  hemoptysis, shortness of breath and wheezing.   Cardiovascular: Negative for chest pain and leg swelling.  Gastrointestinal: Negative for abdominal pain, constipation, diarrhea, nausea and vomiting.  Genitourinary: Negative for bladder incontinence, difficulty urinating, dysuria, frequency and hematuria.   Musculoskeletal: Negative for back pain, gait problem, neck pain and neck stiffness.  Skin: Negative for itching and rash.  Neurological: Negative for dizziness, extremity weakness, gait problem, headaches, light-headedness and seizures.  Hematological: Negative for adenopathy. Does not bruise/bleed easily.  Psychiatric/Behavioral: Negative for confusion, depression and sleep disturbance. The patient is not nervous/anxious.     PHYSICAL EXAMINATION:  There were no vitals taken for this visit.  ECOG PERFORMANCE STATUS: {CHL ONC ECOG [id number]  Physical Exam  Constitutional: Oriented to person, place, and time and well-developed, well-nourished, and in no distress. No distress.  HENT:  Head: Normocephalic and atraumatic.  Mouth/Throat: Oropharynx is clear and moist. No oropharyngeal  exudate.  Eyes: Conjunctivae are normal. Right eye exhibits no discharge. Left eye exhibits no discharge. No scleral icterus.  Neck: Normal range of motion. Neck supple.  Cardiovascular: Normal rate, regular rhythm, normal heart sounds and intact distal pulses.   Pulmonary/Chest: Effort normal and breath sounds normal. No respiratory distress. No wheezes. No rales.  Abdominal: Soft. Bowel sounds are normal. Exhibits no distension and no mass. There is no tenderness.  Musculoskeletal: Normal range of motion. Exhibits no edema.  Lymphadenopathy:    No cervical adenopathy.  Neurological: Alert and oriented to person, place, and time. Exhibits normal muscle tone. Gait normal. Coordination normal.  Skin: Skin is warm and dry. No rash noted. Not diaphoretic. No erythema. No pallor.  Psychiatric: Mood, memory  and judgment normal.  Vitals reviewed.  LABORATORY DATA: Lab Results  Component Value Date   WBC 8.4 01/28/2023   HGB 13.6 01/28/2023   HCT 39.8 01/28/2023   MCV 96.8 01/28/2023   PLT 249 01/28/2023      Chemistry      Component Value Date/Time   NA 135 01/28/2023 1501   K 4.2 01/28/2023 1501   CL 102 01/28/2023 1501   CO2 25 01/28/2023 1501   BUN 22 01/28/2023 1501   CREATININE 0.72 01/28/2023 1501      Component Value Date/Time   CALCIUM 9.5 01/28/2023 1501   ALKPHOS 80 01/28/2023 1501   AST 22 01/28/2023 1501   ALT 15 01/28/2023 1501   BILITOT 0.4 01/28/2023 1501       RADIOGRAPHIC STUDIES:  No results found.   ASSESSMENT/PLAN:  This is a very pleasant 82 year old Caucasian female diagnosed with extensive stage (T1b, N3, M1 C) small cell lung cancer.  She presented with right upper lobe pulmonary nodules in addition to bilateral hilar and right mediastinal as well as left supraclavicular and abdominal lymphadenopathy.  She also has metastatic disease to the in the left humerus.  She was diagnosed in May 2020.   She is status post palliative radiotherapy to the left proximal humerus under the care of Dr. Mitzi Hansen in 2020.   She is currently undergoing palliative systemic chemotherapy with carboplatin for AUC of 5 on day 1, etoposide 100 mg/M2 on days 1, 2 and 3 as well as Imfinzi 1500 mg every 3 weeks with the chemotherapy and Neulasta support. Status post 52 cycles. Starting from cycle #5 the patient has been treated with maintenance treatment with Imfinzi 1500 mg IV every 4 weeks.  She had a pathological fracture in the left humerus underwent additional radiation to this area. Last treatment was 04/21/20.    Labs were reviewed.  Recommend that she proceed with cycle #53 today as scheduled.  We will see her back for follow-up visit in 4 weeks for evaluation before starting cycle #53   I will arrange for a restaging CT scan of the chest, abdomen, and pelvis prior to  her next cycle of treatment which is scheduled for ***  The patient was advised to call immediately if she has any concerning symptoms in the interval. The patient voices understanding of current disease status and treatment options and is in agreement with the current care plan. All questions were answered. The patient knows to call the clinic with any problems, questions or concerns. We can certainly see the patient much sooner if necessary     No orders of the defined types were placed in this encounter.    I spent {CHL ONC TIME VISIT - [id number]} counseling the  patient face to face. The total time spent in the appointment was {CHL ONC TIME VISIT - ZOXWR:[id number]}.  Jearlean Demauro L Lavelle Akel, PA-C 02/20/23

## 2023-02-23 ENCOUNTER — Other Ambulatory Visit: Payer: Self-pay

## 2023-02-25 ENCOUNTER — Ambulatory Visit: Payer: Federal, State, Local not specified - PPO

## 2023-02-25 ENCOUNTER — Other Ambulatory Visit: Payer: Federal, State, Local not specified - PPO

## 2023-02-25 ENCOUNTER — Inpatient Hospital Stay: Payer: Federal, State, Local not specified - PPO

## 2023-02-25 ENCOUNTER — Other Ambulatory Visit: Payer: Self-pay

## 2023-02-25 ENCOUNTER — Inpatient Hospital Stay: Payer: Federal, State, Local not specified - PPO | Admitting: Physician Assistant

## 2023-02-25 ENCOUNTER — Ambulatory Visit: Payer: Federal, State, Local not specified - PPO | Admitting: Internal Medicine

## 2023-02-27 ENCOUNTER — Ambulatory Visit: Payer: Federal, State, Local not specified - PPO

## 2023-02-27 ENCOUNTER — Other Ambulatory Visit: Payer: Federal, State, Local not specified - PPO

## 2023-02-27 ENCOUNTER — Telehealth: Payer: Self-pay | Admitting: Internal Medicine

## 2023-02-27 ENCOUNTER — Ambulatory Visit: Payer: Federal, State, Local not specified - PPO | Admitting: Internal Medicine

## 2023-02-27 NOTE — Telephone Encounter (Signed)
Scheduled per 05/20 scheduled message, patient has been called and notified. 

## 2023-02-28 ENCOUNTER — Inpatient Hospital Stay: Payer: Federal, State, Local not specified - PPO | Attending: Internal Medicine | Admitting: Physician Assistant

## 2023-02-28 ENCOUNTER — Inpatient Hospital Stay: Payer: Federal, State, Local not specified - PPO

## 2023-02-28 VITALS — BP 119/59 | HR 72 | Temp 97.5°F | Resp 17 | Ht 62.0 in | Wt 148.3 lb

## 2023-02-28 DIAGNOSIS — C778 Secondary and unspecified malignant neoplasm of lymph nodes of multiple regions: Secondary | ICD-10-CM | POA: Diagnosis not present

## 2023-02-28 DIAGNOSIS — Z79899 Other long term (current) drug therapy: Secondary | ICD-10-CM | POA: Insufficient documentation

## 2023-02-28 DIAGNOSIS — Z791 Long term (current) use of non-steroidal anti-inflammatories (NSAID): Secondary | ICD-10-CM | POA: Diagnosis not present

## 2023-02-28 DIAGNOSIS — R0609 Other forms of dyspnea: Secondary | ICD-10-CM | POA: Diagnosis not present

## 2023-02-28 DIAGNOSIS — C7951 Secondary malignant neoplasm of bone: Secondary | ICD-10-CM | POA: Diagnosis not present

## 2023-02-28 DIAGNOSIS — C3411 Malignant neoplasm of upper lobe, right bronchus or lung: Secondary | ICD-10-CM

## 2023-02-28 DIAGNOSIS — Z5112 Encounter for antineoplastic immunotherapy: Secondary | ICD-10-CM | POA: Diagnosis not present

## 2023-02-28 DIAGNOSIS — K5900 Constipation, unspecified: Secondary | ICD-10-CM | POA: Diagnosis not present

## 2023-02-28 DIAGNOSIS — Z95828 Presence of other vascular implants and grafts: Secondary | ICD-10-CM

## 2023-02-28 DIAGNOSIS — R053 Chronic cough: Secondary | ICD-10-CM | POA: Insufficient documentation

## 2023-02-28 LAB — CBC WITH DIFFERENTIAL (CANCER CENTER ONLY)
Abs Immature Granulocytes: 0.15 10*3/uL — ABNORMAL HIGH (ref 0.00–0.07)
Basophils Absolute: 0.1 10*3/uL (ref 0.0–0.1)
Basophils Relative: 1 %
Eosinophils Absolute: 0.3 10*3/uL (ref 0.0–0.5)
Eosinophils Relative: 3 %
HCT: 40.5 % (ref 36.0–46.0)
Hemoglobin: 13.9 g/dL (ref 12.0–15.0)
Immature Granulocytes: 2 %
Lymphocytes Relative: 14 %
Lymphs Abs: 1.4 10*3/uL (ref 0.7–4.0)
MCH: 32.9 pg (ref 26.0–34.0)
MCHC: 34.3 g/dL (ref 30.0–36.0)
MCV: 96 fL (ref 80.0–100.0)
Monocytes Absolute: 0.7 10*3/uL (ref 0.1–1.0)
Monocytes Relative: 7 %
Neutro Abs: 7 10*3/uL (ref 1.7–7.7)
Neutrophils Relative %: 73 %
Platelet Count: 250 10*3/uL (ref 150–400)
RBC: 4.22 MIL/uL (ref 3.87–5.11)
RDW: 12.8 % (ref 11.5–15.5)
WBC Count: 9.5 10*3/uL (ref 4.0–10.5)
nRBC: 0 % (ref 0.0–0.2)

## 2023-02-28 LAB — CMP (CANCER CENTER ONLY)
ALT: 14 U/L (ref 0–44)
AST: 21 U/L (ref 15–41)
Albumin: 4 g/dL (ref 3.5–5.0)
Alkaline Phosphatase: 73 U/L (ref 38–126)
Anion gap: 5 (ref 5–15)
BUN: 21 mg/dL (ref 8–23)
CO2: 28 mmol/L (ref 22–32)
Calcium: 9.5 mg/dL (ref 8.9–10.3)
Chloride: 102 mmol/L (ref 98–111)
Creatinine: 0.7 mg/dL (ref 0.44–1.00)
GFR, Estimated: >60 mL/min (ref >60)
Glucose, Bld: 117 mg/dL — ABNORMAL HIGH (ref 70–99)
Potassium: 4.1 mmol/L (ref 3.5–5.1)
Sodium: 135 mmol/L (ref 135–145)
Total Bilirubin: 0.4 mg/dL (ref 0.3–1.2)
Total Protein: 7.1 g/dL (ref 6.5–8.1)

## 2023-02-28 MED ORDER — SODIUM CHLORIDE 0.9 % IV SOLN: Freq: Once | INTRAVENOUS | Status: AC

## 2023-02-28 MED ORDER — SODIUM CHLORIDE 0.9% FLUSH
10.0000 mL | INTRAVENOUS | Status: DC | PRN
  Administered 2023-02-28: 10 mL

## 2023-02-28 MED ORDER — HEPARIN SOD (PORK) LOCK FLUSH 100 UNIT/ML IV SOLN
500.0000 [IU] | Freq: Once | INTRAVENOUS | Status: AC | PRN
  Administered 2023-02-28: 500 [IU]

## 2023-02-28 MED ORDER — SODIUM CHLORIDE 0.9 % IV SOLN
1500.0000 mg | Freq: Once | INTRAVENOUS | Status: AC
  Administered 2023-02-28: 1500 mg via INTRAVENOUS
  Filled 2023-02-28: qty 30

## 2023-02-28 NOTE — Patient Instructions (Signed)
[facility]  Discharge Instructions: Thank you for choosing [facility] to provide your oncology and hematology care.   If you have a lab appointment with the Cancer Center, please go directly to the Cancer Center and check in at the registration area.   Wear comfortable clothing and clothing appropriate for easy access to any Portacath or PICC line.   We strive to give you quality time with your provider. You may need to reschedule your appointment if you arrive late (15 or more minutes).  Arriving late affects you and other patients whose appointments are after yours.  Also, if you miss three or more appointments without notifying the office, you may be dismissed from the clinic at the provider's discretion.      For prescription refill requests, have your pharmacy contact our office and allow 72 hours for refills to be completed.    Today you received the following chemotherapy and/or immunotherapy agents: Imfinzi      To help prevent nausea and vomiting after your treatment, we encourage you to take your nausea medication as directed.  BELOW ARE SYMPTOMS THAT SHOULD BE REPORTED IMMEDIATELY: *FEVER GREATER THAN 100.4 F (38 C) OR HIGHER *CHILLS OR SWEATING *NAUSEA AND VOMITING THAT IS NOT CONTROLLED WITH YOUR NAUSEA MEDICATION *UNUSUAL SHORTNESS OF BREATH *UNUSUAL BRUISING OR BLEEDING *URINARY PROBLEMS (pain or burning when urinating, or frequent urination) *BOWEL PROBLEMS (unusual diarrhea, constipation, pain near the anus) TENDERNESS IN MOUTH AND THROAT WITH OR WITHOUT PRESENCE OF ULCERS (sore throat, sores in mouth, or a toothache) UNUSUAL RASH, SWELLING OR PAIN  UNUSUAL VAGINAL DISCHARGE OR ITCHING   Items with * indicate a potential emergency and should be followed up as soon as possible or go to the Emergency Department if any problems should occur.  Please show the CHEMOTHERAPY ALERT CARD or IMMUNOTHERAPY ALERT CARD at  check-in to the Emergency Department and triage nurse.  Should you have questions after your visit or need to cancel or reschedule your appointment, please contact [facility]  Dept: [phone]  and follow the prompts.  Office hours are 8:00 a.m. to 4:30 p.m. Monday - Friday. Please note that voicemails left after 4:00 p.m. may not be returned until the following business day.  We are closed weekends and major holidays. You have access to a nurse at all times for urgent questions. Please call the main number to the clinic Dept: [phone] and follow the prompts.   For any non-urgent questions, you may also contact your provider using MyChart. We now offer e-Visits for anyone 18 and older to request care online for non-urgent symptoms. For details visit [email or web address].   Also download the MyChart app! Go to the app store, search "MyChart", open the app, select [facility] [place], and log in with your MyChart username and password.   

## 2023-02-28 NOTE — Progress Notes (Signed)
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     PRIOR THERAPY: 1) Palliative radiotherapy to the left proximal humerus under the care of Dr. Mitzi Hansen. 2) Additional palliative radiotherapy to the proximal humerus under the care of Dr. Mitzi Hansen. Last treatment on 04/21/2020  CURRENT THERAPY: Palliative systemic chemotherapy with carboplatin for AUC of 5 on day 1, etoposide 100 mg/M2 on days 1, 2 and 3 as well as Imfinzi 1500 mg every 3 weeks with the chemotherapy and Neulasta support. First dose on 03/16/2019. Status post 52 cycles. Starting from cycle #5 the patient will be treated with maintenance treatment with Imfinzi 1500 mg IV every 4 weeks.    INTERVAL HISTORY: Brittany Marks 82 y.o. female returns to the clinic today for a follow-up visit.  The patient is feeling fine today without any concerning complaints. She denies any changes in her health today. She reports her baseline fatigue. She missed her appointment earlier this week due to transportation concerns. She is wondering how to enroll in the transportation program at [facility]. The patient denies any recent fever, chills, or night sweats, or weight loss. She reports her baseline dyspnea on exertion depending on the activity. She reports her chronic cough, mostly at night which is stable. She denies any chest pain or hemoptysis.  She denies any nausea, vomiting, diarrhea, or constipation. She denies any headaches or visual changes. She denies any rashes.  She still has limited mobility in her left shoulder where she previously had radiation and surgery on the metastatic bone lesion in 2020. She was supposed to have a  restaging CT scan prior to her appointment today but this is scheduled for 03/22/23. She is here today for evaluation and repeat blood work before starting cycle #53    MEDICAL HISTORY: Past Medical History:  Diagnosis Date   Cataract    Bilateral   Constipation    pain medication   Left rotator cuff tear    Right rotator cuff tear    SCL CA dx'd 11/2018    ALLERGIES:  is allergic to oxycodone.  MEDICATIONS:  Current Outpatient Medications  Medication Sig Dispense Refill   albuterol (VENTOLIN HFA) 108 (90 Base) MCG/ACT inhaler Inhale 2 puffs into the lungs every 6 (six) hours as needed for wheezing or shortness of breath. 8 g 2   Ascorbic Acid (VITAMIN C PO) Take by mouth daily.      b complex vitamins tablet Take 1 tablet by mouth daily.     COD LIVER OIL PO Take 1 tablet by mouth daily.      gabapentin (NEURONTIN) 100 MG capsule Take 1 capsule (100 mg total) by mouth 2 (two) times daily. 90 capsule 2   hydrocortisone 2.5 % cream Apply topically as needed. (Patient taking differently: Apply 1 application  topically daily as needed (Treatment).) 3.5 g 0   ibuprofen (ADVIL) 200 MG tablet Take 600 mg by mouth daily.     lidocaine-prilocaine (EMLA) cream Apply 1 Application topically every 30 (thirty) days. 30 g 2   methylPREDNISolone (MEDROL DOSEPAK) 4 MG TBPK tablet Use as instructed 21 tablet 0   Multiple Vitamin (MULTIVITAMIN) tablet Take 1 tablet by mouth daily.  ondansetron (ZOFRAN) 4 MG tablet Take 1 tablet (4 mg total) by mouth every 8 (eight) hours as needed for nausea or vomiting. 10 tablet 0   oxymetazoline (AFRIN) 0.05 % nasal spray Place 2 sprays into the nose 2 (two) times daily as needed (Bloody nose).     prochlorperazine (COMPAZINE) 10 MG tablet Take 1 tablet (10 mg total) by mouth every 6 (six) hours as needed for nausea or vomiting. 30 tablet 0   VITAMIN E PO Take 1 tablet by mouth daily.      No current facility-administered medications for this visit.    Facility-Administered Medications Ordered in Other Visits  Medication Dose Route Frequency Provider Last Rate Last Admin   heparin lock flush 100 unit/mL  500 Units Intracatheter Once PRN Magrinat, Valentino Hue, MD       sodium chloride flush (NS) 0.9 % injection 10 mL  10 mL Intracatheter PRN Magrinat, Valentino Hue, MD        SURGICAL HISTORY:  Past Surgical History:  Procedure Laterality Date   CATARACT EXTRACTION W/ INTRAOCULAR LENS  IMPLANT, BILATERAL  2012   DECOMPRESSIVE LUMBAR LAMINECTOMY LEVEL 2 N/A 07/29/2013   Procedure: LUMBAR LAMINECTOMY, DECOMPRESSION LUMBAR THREE TO FOUR, FOUR TO FIVE microdiscectomy l3,4 right;  Surgeon: Jacki Cones, MD;  Location: [facility];  Service: Orthopedics;  Laterality: N/A;   I & D SUPERIOR RIGHT SHOULDER AND CLOSURE WOUND  01-10-2011   S/P ROTATOR CUFF REPAIR   IR IMAGING GUIDED PORT INSERTION  04/09/2019   LUMBAR LAMINECTOMY  1970'S   LUMBAR LAMINECTOMY/DECOMPRESSION MICRODISCECTOMY N/A 06/27/2016   Procedure: L1 - L2 DISCECTOMY;  Surgeon: Venita Lick, MD;  Location: [facility];  Service: Orthopedics;  Laterality: N/A;   LUMBAR SPINE SURGERY  1983   REVERSE SHOULDER ARTHROPLASTY Left 03/10/2020   Procedure: REVERSE SHOULDER ARTHROPLASTY;  Surgeon: Francena Hanly, MD;  Location: [facility];  Service: Orthopedics;  Laterality: Left;    RIGHT SHOULDER ARTHROSCOPY/ OPEN DISTAL CLAVICLE RESECTION/ SAD/ OPEN ROTATOR CUFF REPAIR  11-28-2010   SHOULDER FUSION SURGERY  03/11/2020   Shoulder Surgery , hardware placed   SHOULDER OPEN ROTATOR CUFF REPAIR  12/19/2011   Procedure: ROTATOR CUFF REPAIR SHOULDER OPEN;  Surgeon: Drucilla Schmidt, MD;  Location: [facility];  Service: Orthopedics;  Laterality: Right;  RIGHT RECURRENT OPEN REPAIR OF THE ROTATOR CUFF WITH TISSUE MEND GRAFTANTERIOR CHROMIOECTOMY   VAGINAL HYSTERECTOMY  1979    REVIEW OF SYSTEMS:   Constitutional: Positive for fatigue. Negative for appetite change, chills, fever and unexpected  weight change.  HENT: Negative for mouth sores, nosebleeds, sore throat and trouble swallowing.  Eyes: Negative for eye problems and icterus.  Respiratory: Positive for stable dyspnea on exertion and chronic cough (mostly at night). Negative for hemoptysis and wheezing.   Cardiovascular: Negative for chest pain and leg swelling.  Gastrointestinal: Negative for abdominal pain, recent diarrhea, constipation, diarrhea, nausea and vomiting.  Genitourinary: Negative for bladder incontinence, difficulty urinating, dysuria, frequency and hematuria.   Musculoskeletal: Positive for chronic back pain. Negative for gait problem, neck pain and neck stiffness.  Skin: Negative for itching and rash.  Neurological: Positive for peripheral neuropathy. Negative for dizziness, extremity weakness, gait problem, headaches, light-headedness and seizures.  Hematological: Negative for adenopathy. Does not bruise/bleed easily.  Psychiatric/Behavioral: Negative for confusion, depression and sleep disturbance. The patient is not nervous/anxious.     PHYSICAL EXAMINATION:  Blood pressure (!) 119/59, pulse 72, temperature (!) 97.5 F (36.4 C), temperature source Oral, resp.  rate 17, height 5\' 2"  (1.575 m), weight 148 lb 5 oz (67.3 kg), SpO2 94 %.  ECOG PERFORMANCE STATUS: 2-3 minutes  Physical Exam  Constitutional: Oriented to person, place, and time and chronically ill appearing female and in no distress.  HENT:  Head: Normocephalic and atraumatic.  Mouth/Throat: Oropharynx is clear and moist. No oropharyngeal exudate.  Eyes: Conjunctivae are normal. Right eye exhibits no discharge. Left eye exhibits no discharge. No scleral icterus.  Neck: Normal range of motion. Neck supple.  Cardiovascular: Normal rate, regular rhythm, normal heart sounds.   Pulmonary/Chest: Effort normal and breath sounds normal. No respiratory distress.  Abdominal: Soft. Exhibits no distension and no mass. There is no tenderness.   Musculoskeletal: Normal range of motion. Exhibits no edema.  Lymphadenopathy:    No cervical adenopathy.  Neurological: Alert and oriented to person, place, and time. Exhibits muscle wasting. Examined in the wheelchair.  Skin: Skin is warm and dry. No rash noted. Not diaphoretic. No erythema. No pallor.  Psychiatric: Mood, memory and judgment normal.  Vitals reviewed.  LABORATORY DATA: Lab Results  Component Value Date   WBC 9.5 02/28/2023   HGB 13.9 02/28/2023   HCT 40.5 02/28/2023   MCV 96.0 02/28/2023   PLT 250 02/28/2023      Chemistry      Component Value Date/Time   NA 135 01/28/2023 1501   K 4.2 01/28/2023 1501   CL 102 01/28/2023 1501   CO2 25 01/28/2023 1501   BUN 22 01/28/2023 1501   CREATININE 0.72 01/28/2023 1501      Component Value Date/Time   CALCIUM 9.5 01/28/2023 1501   ALKPHOS 80 01/28/2023 1501   AST 22 01/28/2023 1501   ALT 15 01/28/2023 1501   BILITOT 0.4 01/28/2023 1501       RADIOGRAPHIC STUDIES:  No results found.   ASSESSMENT/PLAN:  This is a very pleasant 82 year old Caucasian female diagnosed with extensive stage (T1b, N3, M1 C) small cell lung cancer.  She presented with right upper lobe pulmonary nodules in addition to bilateral hilar and right mediastinal as well as left supraclavicular and abdominal lymphadenopathy.  She also has metastatic disease to the in the left humerus.  She was diagnosed in May 2020.   She is status post palliative radiotherapy to the left proximal humerus under the care of Dr. Mitzi Hansen in 2020.   She is currently undergoing palliative systemic chemotherapy with carboplatin for AUC of 5 on day 1, etoposide 100 mg/M2 on days 1, 2 and 3 as well as Imfinzi 1500 mg every 3 weeks with the chemotherapy and Neulasta support. Status post 52 cycles. Starting from cycle #5 the patient has been treated with maintenance treatment with Imfinzi 1500 mg IV every 4 weeks.   She had a pathological fracture in the left humerus  underwent additional radiation to this area. Last treatment was 04/21/20.    Labs were reviewed.  Recommend that she proceed with cycle #53 today as scheduled.    We will see her back for follow-up visit in 4 weeks for evaluation before starting cycle #54   I will arrange for a restaging CT scan of the chest, abdomen, and pelvis prior to her next cycle of treatment.   We will refer her to the [facility] transportation program.    The patient was advised to call immediately if she has any concerning symptoms in the interval. The patient voices understanding of current disease status and treatment options and is in agreement with the current  care plan. All questions were answered. The patient knows to call the clinic with any problems, questions or concerns. We can certainly see the patient much sooner if necessary    No orders of the defined types were placed in this encounter.     The total time spent in the appointment was 20-29 minutes  Lynette Noah L Masaichi Kracht, PA-C 02/28/23

## 2023-03-02 ENCOUNTER — Other Ambulatory Visit: Payer: Self-pay

## 2023-03-05 ENCOUNTER — Telehealth: Payer: Self-pay | Admitting: Internal Medicine

## 2023-03-05 NOTE — Telephone Encounter (Signed)
Scheduled per 05/24 los, patient has been called and notified.  

## 2023-03-22 ENCOUNTER — Ambulatory Visit (HOSPITAL_COMMUNITY)
Admission: RE | Admit: 2023-03-22 | Discharge: 2023-03-22 | Disposition: A | Discharge status: Home / Self Care | Payer: Federal, State, Local not specified - PPO | Source: Ambulatory Visit | Attending: Physician Assistant | Admitting: Physician Assistant

## 2023-03-22 ENCOUNTER — Other Ambulatory Visit: Payer: Self-pay

## 2023-03-22 DIAGNOSIS — C3411 Malignant neoplasm of upper lobe, right bronchus or lung: Secondary | ICD-10-CM | POA: Insufficient documentation

## 2023-03-22 MED ORDER — HEPARIN SOD (PORK) LOCK FLUSH 100 UNIT/ML IV SOLN
500.0000 [IU] | Freq: Once | INTRAVENOUS | Status: AC
  Administered 2023-03-22: 500 [IU] via INTRAVENOUS

## 2023-03-22 MED ORDER — HEPARIN SOD (PORK) LOCK FLUSH 100 UNIT/ML IV SOLN
INTRAVENOUS | Status: AC
  Filled 2023-03-22: qty 5

## 2023-03-22 MED ORDER — IOHEXOL 300 MG/ML  SOLN
100.0000 mL | Freq: Once | INTRAMUSCULAR | Status: AC | PRN
  Administered 2023-03-22: 100 mL via INTRAVENOUS

## 2023-03-25 ENCOUNTER — Inpatient Hospital Stay: Payer: Federal, State, Local not specified - PPO

## 2023-03-25 ENCOUNTER — Ambulatory Visit: Payer: Federal, State, Local not specified - PPO | Admitting: Physician Assistant

## 2023-03-25 ENCOUNTER — Ambulatory Visit: Payer: Federal, State, Local not specified - PPO

## 2023-03-25 ENCOUNTER — Inpatient Hospital Stay: Payer: Federal, State, Local not specified - PPO | Admitting: Physician Assistant

## 2023-03-25 ENCOUNTER — Other Ambulatory Visit: Payer: Federal, State, Local not specified - PPO

## 2023-03-26 ENCOUNTER — Other Ambulatory Visit: Payer: Self-pay

## 2023-03-26 NOTE — Progress Notes (Unsigned)
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     PRIOR THERAPY: 1) Palliative radiotherapy to the left proximal humerus under the care of Dr. Mitzi Hansen. 2) Additional palliative radiotherapy to the proximal humerus under the care of Dr. Mitzi Hansen. Last treatment on 04/21/2020  CURRENT THERAPY: Palliative systemic chemotherapy with carboplatin for AUC of 5 on day 1, etoposide 100 mg/M2 on days 1, 2 and 3 as well as Imfinzi 1500 mg every 3 weeks with the chemotherapy and Neulasta support. First dose on 03/16/2019. Status post 53 cycles. Starting from cycle #5 the patient will be treated with maintenance treatment with Imfinzi 1500 mg IV every 4 weeks.    INTERVAL HISTORY: Brittany Marks 82 y.o. female returns to the clinic today for a follow-up visit.  The patient is feeling fine today without any concerning complaints. She denies any changes in her health today. She reports her baseline fatigue.   The patient denies any recent fever, chills, or night sweats, or weight loss. She reports her baseline dyspnea on exertion depending on the activity. She reports her chronic cough, mostly at night which is stable. She denies any chest pain or hemoptysis.  She denies any nausea, vomiting, diarrhea, or constipation. She denies any headaches or visual changes. She denies any rashes.  She still has limited mobility in her left shoulder where she previously had radiation and surgery on the metastatic bone lesion in 2020.  Recently had a restaging CT scan performed.  She is here today for evaluation and to review her scan results reason for starting cycle #54.  MEDICAL HISTORY: Past Medical  History:  Diagnosis Date   Cataract    Bilateral   Constipation    pain medication   Left rotator cuff tear    Right rotator cuff tear    SCL CA dx'd 11/2018    ALLERGIES:  is allergic to oxycodone.  MEDICATIONS:  Current Outpatient Medications  Medication Sig Dispense Refill   albuterol (VENTOLIN HFA) 108 (90 Base) MCG/ACT inhaler Inhale 2 puffs into the lungs every 6 (six) hours as needed for wheezing or shortness of breath. 8 g 2   Ascorbic Acid (VITAMIN C PO) Take by mouth daily.      b complex vitamins tablet Take 1 tablet by mouth daily.     COD LIVER OIL PO Take 1 tablet by mouth daily.      gabapentin (NEURONTIN) 100 MG capsule Take 1 capsule (100 mg total) by mouth 2 (two) times daily. 90 capsule 2   hydrocortisone 2.5 % cream Apply topically as needed. (Patient taking differently: Apply 1 application  topically daily as needed (Treatment).) 3.5 g 0   ibuprofen (ADVIL) 200 MG tablet Take 600 mg by mouth daily.     lidocaine-prilocaine (EMLA) cream Apply 1 Application topically every 30 (thirty) days. 30 g 2   methylPREDNISolone (MEDROL DOSEPAK) 4 MG TBPK tablet Use as instructed 21 tablet 0   Multiple Vitamin (MULTIVITAMIN) tablet Take 1 tablet by mouth daily.     ondansetron (ZOFRAN) 4 MG tablet Take 1 tablet (4 mg total) by mouth every 8 (eight) hours as needed for nausea or vomiting. 10 tablet 0   oxymetazoline (  AFRIN) 0.05 % nasal spray Place 2 sprays into the nose 2 (two) times daily as needed (Bloody nose).     prochlorperazine (COMPAZINE) 10 MG tablet Take 1 tablet (10 mg total) by mouth every 6 (six) hours as needed for nausea or vomiting. 30 tablet 0   VITAMIN E PO Take 1 tablet by mouth daily.      No current facility-administered medications for this visit.   Facility-Administered Medications Ordered in Other Visits  Medication Dose Route Frequency Provider Last Rate Last Admin   heparin lock flush 100 unit/mL  500 Units Intracatheter Once PRN Magrinat, Valentino Hue,  MD       sodium chloride flush (NS) 0.9 % injection 10 mL  10 mL Intracatheter PRN Magrinat, Valentino Hue, MD        SURGICAL HISTORY:  Past Surgical History:  Procedure Laterality Date   CATARACT EXTRACTION W/ INTRAOCULAR LENS  IMPLANT, BILATERAL  2012   DECOMPRESSIVE LUMBAR LAMINECTOMY LEVEL 2 N/A 07/29/2013   Procedure: LUMBAR LAMINECTOMY, DECOMPRESSION LUMBAR THREE TO FOUR, FOUR TO FIVE microdiscectomy l3,4 right;  Surgeon: Jacki Cones, MD;  Location: [facility];  Service: Orthopedics;  Laterality: N/A;   I & D SUPERIOR RIGHT SHOULDER AND CLOSURE WOUND  01-10-2011   S/P ROTATOR CUFF REPAIR   IR IMAGING GUIDED PORT INSERTION  04/09/2019   LUMBAR LAMINECTOMY  1970'S   LUMBAR LAMINECTOMY/DECOMPRESSION MICRODISCECTOMY N/A 06/27/2016   Procedure: L1 - L2 DISCECTOMY;  Surgeon: Venita Lick, MD;  Location: [facility];  Service: Orthopedics;  Laterality: N/A;   LUMBAR SPINE SURGERY  1983   REVERSE SHOULDER ARTHROPLASTY Left 03/10/2020   Procedure: REVERSE SHOULDER ARTHROPLASTY;  Surgeon: Francena Hanly, MD;  Location: [facility];  Service: Orthopedics;  Laterality: Left;    RIGHT SHOULDER ARTHROSCOPY/ OPEN DISTAL CLAVICLE RESECTION/ SAD/ OPEN ROTATOR CUFF REPAIR  11-28-2010   SHOULDER FUSION SURGERY  03/11/2020   Shoulder Surgery , hardware placed   SHOULDER OPEN ROTATOR CUFF REPAIR  12/19/2011   Procedure: ROTATOR CUFF REPAIR SHOULDER OPEN;  Surgeon: Drucilla Schmidt, MD;  Location: [facility];  Service: Orthopedics;  Laterality: Right;  RIGHT RECURRENT OPEN REPAIR OF THE ROTATOR CUFF WITH TISSUE MEND GRAFTANTERIOR CHROMIOECTOMY   VAGINAL HYSTERECTOMY  1979    REVIEW OF SYSTEMS:   Review of Systems  Constitutional: Negative for appetite change, chills, fatigue, fever and unexpected weight change.  HENT:   Negative for mouth sores, nosebleeds, sore throat and trouble swallowing.   Eyes: Negative for eye problems and icterus.  Respiratory: Negative for cough, hemoptysis, shortness of  breath and wheezing.   Cardiovascular: Negative for chest pain and leg swelling.  Gastrointestinal: Negative for abdominal pain, constipation, diarrhea, nausea and vomiting.  Genitourinary: Negative for bladder incontinence, difficulty urinating, dysuria, frequency and hematuria.   Musculoskeletal: Negative for back pain, gait problem, neck pain and neck stiffness.  Skin: Negative for itching and rash.  Neurological: Negative for dizziness, extremity weakness, gait problem, headaches, light-headedness and seizures.  Hematological: Negative for adenopathy. Does not bruise/bleed easily.  Psychiatric/Behavioral: Negative for confusion, depression and sleep disturbance. The patient is not nervous/anxious.     PHYSICAL EXAMINATION:  There were no vitals taken for this visit.  ECOG PERFORMANCE STATUS: {CHL ONC ECOG [id number]  Physical Exam  Constitutional: Oriented to person, place, and time and well-developed, well-nourished, and in no distress. No distress.  HENT:  Head: Normocephalic and atraumatic.  Mouth/Throat: Oropharynx is clear and moist. No oropharyngeal exudate.  Eyes: Conjunctivae are  normal. Right eye exhibits no discharge. Left eye exhibits no discharge. No scleral icterus.  Neck: Normal range of motion. Neck supple.  Cardiovascular: Normal rate, regular rhythm, normal heart sounds and intact distal pulses.   Pulmonary/Chest: Effort normal and breath sounds normal. No respiratory distress. No wheezes. No rales.  Abdominal: Soft. Bowel sounds are normal. Exhibits no distension and no mass. There is no tenderness.  Musculoskeletal: Normal range of motion. Exhibits no edema.  Lymphadenopathy:    No cervical adenopathy.  Neurological: Alert and oriented to person, place, and time. Exhibits normal muscle tone. Gait normal. Coordination normal.  Skin: Skin is warm and dry. No rash noted. Not diaphoretic. No erythema. No pallor.  Psychiatric: Mood, memory and judgment normal.   Vitals reviewed.  LABORATORY DATA: Lab Results  Component Value Date   WBC 9.5 02/28/2023   HGB 13.9 02/28/2023   HCT 40.5 02/28/2023   MCV 96.0 02/28/2023   PLT 250 02/28/2023      Chemistry      Component Value Date/Time   NA 135 02/28/2023 1450   K 4.1 02/28/2023 1450   CL 102 02/28/2023 1450   CO2 28 02/28/2023 1450   BUN 21 02/28/2023 1450   CREATININE 0.70 02/28/2023 1450      Component Value Date/Time   CALCIUM 9.5 02/28/2023 1450   ALKPHOS 73 02/28/2023 1450   AST 21 02/28/2023 1450   ALT 14 02/28/2023 1450   BILITOT 0.4 02/28/2023 1450       RADIOGRAPHIC STUDIES:  CT CHEST ABDOMEN PELVIS W CONTRAST  Result Date: 03/23/2023 CLINICAL DATA:  Metastatic disease evaluation, small-cell lung cancer * Tracking Code: BO * EXAM: CT CHEST, ABDOMEN, AND PELVIS WITH CONTRAST TECHNIQUE: Multidetector CT imaging of the chest, abdomen and pelvis was performed following the standard protocol during bolus administration of intravenous contrast. RADIATION DOSE REDUCTION: This exam was performed according to the departmental dose-optimization program which includes automated exposure control, adjustment of the mA and/or kV according to patient size and/or use of iterative reconstruction technique. CONTRAST:  OMNIPAQUE IOHEXOL 300 MG/ML  SOLN COMPARISON:  10/26/2022 FINDINGS: CT CHEST FINDINGS Cardiovascular: Right chest port catheter. Aortic atherosclerosis. Cardiomegaly. Three-vessel coronary artery calcifications. No pericardial effusion. Mediastinum/Nodes: Unchanged prominent mediastinal and hilar lymph nodes, index pretracheal node measuring 1.3 x 1.0 cm (series 2, image 19). Index prevascular node measures 1.2 x 0.9 cm (series 2, image 22). Thyroid gland, trachea, and esophagus demonstrate no significant findings. Lungs/Pleura: Unchanged appearance of the chest. Subpleural radiation fibrosis of the anterior left upper lobe measuring 3.7 x 2.1 cm (series 4, image 27). Moderate  underlying pulmonary fibrosis in a pattern with apical to basal gradient featuring irregular peripheral interstitial opacity, septal thickening, traction bronchiectasis, and areas of subpleural bronchiolectasis of the lung bases without clear evidence of honeycombing. Mild underlying centrilobular and paraseptal emphysema. No pleural effusion or pneumothorax. Musculoskeletal: No chest wall abnormality. No acute osseous findings. CT ABDOMEN PELVIS FINDINGS Hepatobiliary: No solid liver abnormality is seen. No gallstones, gallbladder wall thickening, or biliary dilatation. Pancreas: Unremarkable. No pancreatic ductal dilatation or surrounding inflammatory changes. Spleen: Normal in size without significant abnormality. Adrenals/Urinary Tract: Adrenal glands are unremarkable. Similar severe enlargement of the right renal pelvis with moderate hydronephrosis and abrupt caliber change of the ureteropelvic junction. No left-sided hydronephrosis. No calculi or suspicious renal lesions. Bladder is unremarkable. Stomach/Bowel: Stomach is within normal limits. Appendix appears normal. No evidence of bowel wall thickening, distention, or inflammatory changes. Sigmoid diverticula. Vascular/Lymphatic: Severe, pipe like aortic  atherosclerosis. 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 Unchanged, ill-defined soft tissue about the common hepatic artery consistent with treated lymphadenopathy (series 2, image 51). No enlarged abdominal or pelvic lymph nodes. Reproductive: Status post hysterectomy. Other: No abdominal wall hernia or abnormality. No ascites. Musculoskeletal: No acute osseous findings. Diffuse osseous sclerosis suggesting renal osteodystrophy. IMPRESSION: 1. Unchanged appearance of the chest with subpleural radiation fibrosis of the anterior left upper lobe. 2. Unchanged prominent mediastinal and hilar lymph nodes. 3. Unchanged ill-defined soft tissue about the common hepatic artery, in keeping  with treated lymphadenopathy. 4. No evidence of new metastatic disease in the chest, abdomen, or pelvis. 5. Moderate underlying pulmonary fibrosis in a pattern with apical to basal gradient featuring irregular peripheral interstitial opacity, septal thickening, traction bronchiectasis, and areas of subpleural bronchiolectasis of the lung bases without clear evidence of honeycombing. Underlying mild emphysema. 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 6. Similar severe enlargement of the right renal pelvis with moderate hydronephrosis and abrupt caliber change of the ureteropelvic junction, consistent with chronic UPJ stricture. Functional significance could be assessed by nuclear scintigraphic Lasix renogram if desired. 7. Severe atherosclerosis including unchanged high-grade stenosis or occlusion of the left subclavian artery origin and long segment occlusion of the right common iliac artery. 8. Coronary artery disease Aortic Atherosclerosis (ICD10-I70.0) and Emphysema (ICD10-J43.9). Electronically Signed   By: Jearld Lesch M.D.   On: 03/23/2023 07:12     ASSESSMENT/PLAN:  This is a very pleasant 82 year old Caucasian female diagnosed with extensive stage (T1b, N3, M1 C) small cell lung cancer.  She presented with right upper lobe pulmonary nodules in addition to bilateral hilar and right mediastinal as well as left supraclavicular and abdominal lymphadenopathy.  She also has metastatic disease to the in the left humerus.  She was diagnosed in May 2020.   She is status post palliative radiotherapy to the left proximal humerus under the care of Dr. Mitzi Hansen in 2020.   She is currently undergoing palliative systemic chemotherapy with carboplatin for AUC of 5 on day 1, etoposide 100 mg/M2 on days 1, 2 and 3 as well as  Imfinzi 1500 mg every 3 weeks with the chemotherapy and Neulasta support. Status post 53 cycles. Starting from cycle #5 the patient has been treated with maintenance treatment with Imfinzi 1500 mg IV every 4 weeks.  She had a pathological fracture in the left humerus underwent additional radiation to this area. Last treatment was 04/21/20.  Patient recently had a restaging CT scan performed.  The patient was seen with Dr. Arbutus Ped today.  Dr. Arbutus Ped personally and independently reviewed the scan and discussed the results with the patient today.  The scan showed no evidence of disease progression.  Labs were reviewed.  Recommend that she proceed with cycle #54 today as scheduled.   We will see her back for follow-up visit in 4 weeks for evaluation before starting cycle #55  The patient was advised to call immediately if she has any concerning symptoms in the interval. The patient voices understanding of current disease status and treatment options and is in agreement with the current care plan. All questions were answered. The patient knows to call the clinic with any problems, questions or concerns. We can certainly see the patient much sooner if necessary   No orders of the  defined types were placed in this encounter.    I spent {CHL ONC TIME VISIT - ZOXWR:[id number]} counseling the patient face to face. The total time spent in the appointment was {CHL ONC TIME VISIT - BJYNW:[id number]}.  Neysa Arts L Brysen Shankman, PA-C 03/26/23

## 2023-03-28 ENCOUNTER — Other Ambulatory Visit: Payer: Self-pay

## 2023-03-28 ENCOUNTER — Inpatient Hospital Stay: Payer: Federal, State, Local not specified - PPO

## 2023-03-28 ENCOUNTER — Inpatient Hospital Stay (HOSPITAL_BASED_OUTPATIENT_CLINIC_OR_DEPARTMENT_OTHER): Payer: Federal, State, Local not specified - PPO | Admitting: Physician Assistant

## 2023-03-28 ENCOUNTER — Inpatient Hospital Stay: Payer: Federal, State, Local not specified - PPO | Attending: Internal Medicine

## 2023-03-28 VITALS — BP 140/60 | HR 75 | Resp 16

## 2023-03-28 VITALS — BP 114/88 | HR 75 | Temp 97.9°F | Resp 17 | Wt 150.2 lb

## 2023-03-28 DIAGNOSIS — C3411 Malignant neoplasm of upper lobe, right bronchus or lung: Secondary | ICD-10-CM

## 2023-03-28 DIAGNOSIS — Z5112 Encounter for antineoplastic immunotherapy: Secondary | ICD-10-CM

## 2023-03-28 DIAGNOSIS — Z95828 Presence of other vascular implants and grafts: Secondary | ICD-10-CM

## 2023-03-28 DIAGNOSIS — C7951 Secondary malignant neoplasm of bone: Secondary | ICD-10-CM | POA: Diagnosis not present

## 2023-03-28 LAB — CBC WITH DIFFERENTIAL (CANCER CENTER ONLY)
Abs Immature Granulocytes: 0.02 10*3/uL (ref 0.00–0.07)
Basophils Absolute: 0 10*3/uL (ref 0.0–0.1)
Basophils Relative: 0 %
Eosinophils Absolute: 0.3 10*3/uL (ref 0.0–0.5)
Eosinophils Relative: 3 %
HCT: 39.5 % (ref 36.0–46.0)
Hemoglobin: 13.4 g/dL (ref 12.0–15.0)
Immature Granulocytes: 0 %
Lymphocytes Relative: 14 %
Lymphs Abs: 1.3 10*3/uL (ref 0.7–4.0)
MCH: 32.9 pg (ref 26.0–34.0)
MCHC: 33.9 g/dL (ref 30.0–36.0)
MCV: 97.1 fL (ref 80.0–100.0)
Monocytes Absolute: 0.7 10*3/uL (ref 0.1–1.0)
Monocytes Relative: 8 %
Neutro Abs: 6.9 10*3/uL (ref 1.7–7.7)
Neutrophils Relative %: 75 %
Platelet Count: 256 10*3/uL (ref 150–400)
RBC: 4.07 MIL/uL (ref 3.87–5.11)
RDW: 12.9 % (ref 11.5–15.5)
WBC Count: 9.2 10*3/uL (ref 4.0–10.5)
nRBC: 0 % (ref 0.0–0.2)

## 2023-03-28 LAB — CMP (CANCER CENTER ONLY)
ALT: 14 U/L (ref 0–44)
AST: 20 U/L (ref 15–41)
Albumin: 3.6 g/dL (ref 3.5–5.0)
Alkaline Phosphatase: 75 U/L (ref 38–126)
Anion gap: 7 (ref 5–15)
BUN: 21 mg/dL (ref 8–23)
CO2: 25 mmol/L (ref 22–32)
Calcium: 9.3 mg/dL (ref 8.9–10.3)
Chloride: 103 mmol/L (ref 98–111)
Creatinine: 0.78 mg/dL (ref 0.44–1.00)
GFR, Estimated: >60 mL/min (ref >60)
Glucose, Bld: 117 mg/dL — ABNORMAL HIGH (ref 70–99)
Potassium: 4.2 mmol/L (ref 3.5–5.1)
Sodium: 135 mmol/L (ref 135–145)
Total Bilirubin: 0.4 mg/dL (ref 0.3–1.2)
Total Protein: 6.8 g/dL (ref 6.5–8.1)

## 2023-03-28 MED ORDER — SODIUM CHLORIDE 0.9% FLUSH
10.0000 mL | INTRAVENOUS | Status: DC | PRN
  Administered 2023-03-28: 10 mL

## 2023-03-28 MED ORDER — SODIUM CHLORIDE 0.9 % IV SOLN: Freq: Once | INTRAVENOUS | Status: AC

## 2023-03-28 MED ORDER — HEPARIN SOD (PORK) LOCK FLUSH 100 UNIT/ML IV SOLN
500.0000 [IU] | Freq: Once | INTRAVENOUS | Status: AC | PRN
  Administered 2023-03-28: 500 [IU]

## 2023-03-28 MED ORDER — SODIUM CHLORIDE 0.9 % IV SOLN
1500.0000 mg | Freq: Once | INTRAVENOUS | Status: AC
  Administered 2023-03-28: 1500 mg via INTRAVENOUS
  Filled 2023-03-28: qty 30

## 2023-03-28 NOTE — Patient Instructions (Signed)
[facility]  Discharge Instructions: Thank you for choosing [facility] to provide your oncology and hematology care.   If you have a lab appointment with the Cancer Center, please go directly to the Cancer Center and check in at the registration area.   Wear comfortable clothing and clothing appropriate for easy access to any Portacath or PICC line.   We strive to give you quality time with your provider. You may need to reschedule your appointment if you arrive late (15 or more minutes).  Arriving late affects you and other patients whose appointments are after yours.  Also, if you miss three or more appointments without notifying the office, you may be dismissed from the clinic at the provider's discretion.      For prescription refill requests, have your pharmacy contact our office and allow 72 hours for refills to be completed.    Today you received the following chemotherapy and/or immunotherapy agents: Imfinzi      To help prevent nausea and vomiting after your treatment, we encourage you to take your nausea medication as directed.  BELOW ARE SYMPTOMS THAT SHOULD BE REPORTED IMMEDIATELY: *FEVER GREATER THAN 100.4 F (38 C) OR HIGHER *CHILLS OR SWEATING *NAUSEA AND VOMITING THAT IS NOT CONTROLLED WITH YOUR NAUSEA MEDICATION *UNUSUAL SHORTNESS OF BREATH *UNUSUAL BRUISING OR BLEEDING *URINARY PROBLEMS (pain or burning when urinating, or frequent urination) *BOWEL PROBLEMS (unusual diarrhea, constipation, pain near the anus) TENDERNESS IN MOUTH AND THROAT WITH OR WITHOUT PRESENCE OF ULCERS (sore throat, sores in mouth, or a toothache) UNUSUAL RASH, SWELLING OR PAIN  UNUSUAL VAGINAL DISCHARGE OR ITCHING   Items with * indicate a potential emergency and should be followed up as soon as possible or go to the Emergency Department if any problems should occur.  Please show the CHEMOTHERAPY ALERT CARD or IMMUNOTHERAPY ALERT CARD at  check-in to the Emergency Department and triage nurse.  Should you have questions after your visit or need to cancel or reschedule your appointment, please contact [facility]  Dept: [phone]  and follow the prompts.  Office hours are 8:00 a.m. to 4:30 p.m. Monday - Friday. Please note that voicemails left after 4:00 p.m. may not be returned until the following business day.  We are closed weekends and major holidays. You have access to a nurse at all times for urgent questions. Please call the main number to the clinic Dept: [phone] and follow the prompts.   For any non-urgent questions, you may also contact your provider using MyChart. We now offer e-Visits for anyone 18 and older to request care online for non-urgent symptoms. For details visit [email or web address].   Also download the MyChart app! Go to the app store, search "MyChart", open the app, select [facility], and log in with your MyChart username and password.   

## 2023-03-31 ENCOUNTER — Other Ambulatory Visit: Payer: Self-pay

## 2023-04-04 ENCOUNTER — Other Ambulatory Visit: Payer: Self-pay

## 2023-04-09 ENCOUNTER — Other Ambulatory Visit: Payer: Self-pay

## 2023-04-10 ENCOUNTER — Other Ambulatory Visit: Payer: Self-pay

## 2023-04-21 NOTE — Progress Notes (Signed)
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     PRIOR THERAPY: 1) Palliative radiotherapy to the left proximal humerus under the care of Dr. Mitzi Hansen. 2) Additional palliative radiotherapy to the proximal humerus under the care of Dr. Mitzi Hansen. Last treatment on 04/21/2020  CURRENT THERAPY: Palliative systemic chemotherapy with carboplatin for AUC of 5 on day 1, etoposide 100 mg/M2 on days 1, 2 and 3 as well as Imfinzi 1500 mg every 3 weeks with the chemotherapy and Neulasta support. First dose on 03/16/2019. Status post 54 cycles. Starting from cycle #5 the patient will be treated with maintenance treatment with Imfinzi 1500 mg IV every 4 weeks.    INTERVAL HISTORY: Brittany Marks 82 y.o. female returns to the clinic today for a follow-up visit.  The patient is feeling fine today without any concerning complaint except she mentions she has trouble breathing with the hot weather. Her breathing is at its baseline when she is indoors. She also mentions she has trouble sleeping the night before her treatments, even though she is not nervous to come to our visit.   She reports her baseline fatigue. The patient denies any recent fever, chills, or night sweats. She reports her chronic cough, mostly at night which is stable compared to her baseline. She denies any chest pain or hemoptysis.  She denies any nausea, vomiting, diarrhea, or constipation. She denies any headaches or visual changes. She denies any rashes. She is here today for evaluation and repeat blood work before undergoing cycle #55.    MEDICAL HISTORY: Past Medical History:  Diagnosis Date   Cataract    Bilateral    Constipation    pain medication   Left rotator cuff tear    Right rotator cuff tear    SCL CA dx'd 11/2018    ALLERGIES:  is allergic to oxycodone.  MEDICATIONS:  Current Outpatient Medications  Medication Sig Dispense Refill   albuterol (VENTOLIN HFA) 108 (90 Base) MCG/ACT inhaler Inhale 2 puffs into the lungs every 6 (six) hours as needed for wheezing or shortness of breath. 8 g 2   Ascorbic Acid (VITAMIN C PO) Take by mouth daily.      b complex vitamins tablet Take 1 tablet by mouth daily.     COD LIVER OIL PO Take 1 tablet by mouth daily.      gabapentin (NEURONTIN) 100 MG capsule Take 1 capsule (100 mg total) by mouth 2 (two) times daily. 90 capsule 2   hydrocortisone 2.5 % cream Apply topically as needed. (Patient taking differently: Apply 1 application  topically daily as needed (Treatment).) 3.5 g 0   ibuprofen (ADVIL) 200 MG tablet Take 600 mg by mouth daily.     lidocaine-prilocaine (EMLA) cream Apply 1 Application topically every 30 (thirty) days. 30 g 2   methylPREDNISolone (MEDROL DOSEPAK) 4 MG TBPK tablet Use as instructed 21 tablet 0   Multiple Vitamin (MULTIVITAMIN) tablet Take 1 tablet by mouth daily.     ondansetron (ZOFRAN) 4 MG tablet Take 1 tablet (4 mg total) by mouth every 8 (eight) hours as needed for nausea or vomiting. 10 tablet 0   oxymetazoline (AFRIN) 0.05 % nasal spray Place 2  sprays into the nose 2 (two) times daily as needed (Bloody nose).     prochlorperazine (COMPAZINE) 10 MG tablet Take 1 tablet (10 mg total) by mouth every 6 (six) hours as needed for nausea or vomiting. 30 tablet 0   VITAMIN E PO Take 1 tablet by mouth daily.      No current facility-administered medications for this visit.   Facility-Administered Medications Ordered in Other Visits  Medication Dose Route Frequency Provider Last Rate Last Admin   heparin lock flush 100 unit/mL  500 Units Intracatheter Once PRN Magrinat, Valentino Hue, MD       sodium chloride flush (NS) 0.9 %  injection 10 mL  10 mL Intracatheter PRN Magrinat, Valentino Hue, MD        SURGICAL HISTORY:  Past Surgical History:  Procedure Laterality Date   CATARACT EXTRACTION W/ INTRAOCULAR LENS  IMPLANT, BILATERAL  2012   DECOMPRESSIVE LUMBAR LAMINECTOMY LEVEL 2 N/A 07/29/2013   Procedure: LUMBAR LAMINECTOMY, DECOMPRESSION LUMBAR THREE TO FOUR, FOUR TO FIVE microdiscectomy l3,4 right;  Surgeon: Jacki Cones, MD;  Location: [facility];  Service: Orthopedics;  Laterality: N/A;   I & D SUPERIOR RIGHT SHOULDER AND CLOSURE WOUND  01-10-2011   S/P ROTATOR CUFF REPAIR   IR IMAGING GUIDED PORT INSERTION  04/09/2019   LUMBAR LAMINECTOMY  1970'S   LUMBAR LAMINECTOMY/DECOMPRESSION MICRODISCECTOMY N/A 06/27/2016   Procedure: L1 - L2 DISCECTOMY;  Surgeon: Venita Lick, MD;  Location: [facility];  Service: Orthopedics;  Laterality: N/A;   LUMBAR SPINE SURGERY  1983   REVERSE SHOULDER ARTHROPLASTY Left 03/10/2020   Procedure: REVERSE SHOULDER ARTHROPLASTY;  Surgeon: Francena Hanly, MD;  Location: [facility];  Service: Orthopedics;  Laterality: Left;    RIGHT SHOULDER ARTHROSCOPY/ OPEN DISTAL CLAVICLE RESECTION/ SAD/ OPEN ROTATOR CUFF REPAIR  11-28-2010   SHOULDER FUSION SURGERY  03/11/2020   Shoulder Surgery , hardware placed   SHOULDER OPEN ROTATOR CUFF REPAIR  12/19/2011   Procedure: ROTATOR CUFF REPAIR SHOULDER OPEN;  Surgeon: Drucilla Schmidt, MD;  Location: [facility];  Service: Orthopedics;  Laterality: Right;  RIGHT RECURRENT OPEN REPAIR OF THE ROTATOR CUFF WITH TISSUE MEND GRAFTANTERIOR CHROMIOECTOMY   VAGINAL HYSTERECTOMY  1979    REVIEW OF SYSTEMS:   Constitutional: Positive for fatigue. Negative for appetite change, chills, fever and unexpected weight change.  HENT: Negative for mouth sores, nosebleeds, sore throat and trouble swallowing.  Eyes: Negative for eye problems and icterus.  Respiratory: Positive for stable dyspnea on exertion and chronic cough (mostly at night). Negative for  hemoptysis and wheezing.   Cardiovascular: Negative for chest pain and leg swelling.  Gastrointestinal: Negative for abdominal pain, recent diarrhea, constipation, diarrhea, nausea and vomiting.  Genitourinary: Negative for bladder incontinence, difficulty urinating, dysuria, frequency and hematuria.   Musculoskeletal: Positive for chronic back pain. Positive for knee pain. Negative for gait problem, neck pain and neck stiffness.  Skin: Negative for itching and rash.  Neurological: Positive for peripheral neuropathy. Negative for dizziness, extremity weakness, gait problem, headaches, light-headedness and seizures.  Hematological: Negative for adenopathy. Does not bruise/bleed easily.  Psychiatric/Behavioral: Positive for trouble sleeping the day before infusions. Negative for confusion, depression and sleep disturbance. The patient is not nervous/anxious     PHYSICAL EXAMINATION:  Blood pressure 124/72, pulse 71, temperature 97.8 F (36.6 C), temperature source Oral, resp. rate 14, weight 147 lb 12.8 oz (67 kg), SpO2 90%.  ECOG PERFORMANCE STATUS: 2-3  Physical Exam  Constitutional: Oriented to person, place, and  time and chronically ill appearing female and in no distress.  HENT:  Head: Normocephalic and atraumatic.  Mouth/Throat: Oropharynx is clear and moist. No oropharyngeal exudate.  Eyes: Conjunctivae are normal. Right eye exhibits no discharge. Left eye exhibits no discharge. No scleral icterus.  Neck: Normal range of motion. Neck supple.  Cardiovascular: Normal rate, regular rhythm, normal heart sounds.   Pulmonary/Chest: Effort normal and breath sounds normal. No respiratory distress.  Abdominal: Soft. Exhibits no distension and no mass. There is no tenderness.  Musculoskeletal: Normal range of motion. Exhibits no edema.  Lymphadenopathy:    No cervical adenopathy.  Neurological: Alert and oriented to person, place, and time. Exhibits muscle wasting. Examined in the  wheelchair.  Skin: Skin is warm and dry. No rash noted. Not diaphoretic. No erythema. No pallor.  Psychiatric: Mood, memory and judgment normal.  Vitals reviewed.    LABORATORY DATA: Lab Results  Component Value Date   WBC 8.8 04/24/2023   HGB 14.2 04/24/2023   HCT 41.4 04/24/2023   MCV 96.7 04/24/2023   PLT 240 04/24/2023      Chemistry      Component Value Date/Time   NA 135 03/28/2023 1424   K 4.2 03/28/2023 1424   CL 103 03/28/2023 1424   CO2 25 03/28/2023 1424   BUN 21 03/28/2023 1424   CREATININE 0.78 03/28/2023 1424      Component Value Date/Time   CALCIUM 9.3 03/28/2023 1424   ALKPHOS 75 03/28/2023 1424   AST 20 03/28/2023 1424   ALT 14 03/28/2023 1424   BILITOT 0.4 03/28/2023 1424       RADIOGRAPHIC STUDIES:  No results found.   ASSESSMENT/PLAN:  This is a very pleasant 82 year old Caucasian female diagnosed with extensive stage (T1b, N3, M1 C) small cell lung cancer.  She presented with right upper lobe pulmonary nodules in addition to bilateral hilar and right mediastinal as well as left supraclavicular and abdominal lymphadenopathy.  She also has metastatic disease to the in the left humerus.  She was diagnosed in May 2020.   She is status post palliative radiotherapy to the left proximal humerus under the care of Dr. Mitzi Hansen in 2020.  She is currently undergoing palliative systemic chemotherapy with carboplatin for AUC of 5 on day 1, etoposide 100 mg/M2 on days 1, 2 and 3 as well as Imfinzi 1500 mg every 3 weeks with the chemotherapy and Neulasta support. Status post 54 cycles. Starting from cycle #5 the patient has been treated with maintenance treatment with Imfinzi 1500 mg IV every 4 weeks.   She had a pathological fracture in the left humerus underwent additional radiation to this area. Last treatment was 04/21/20.   Labs were reviewed. Recommend that she proceed with cycle #55 today as scheduled.   We will see her back for follow-up visit in 4 weeks  for evaluation before starting cycle #56.   The patient was advised to call immediately if she has any concerning symptoms in the interval. The patient voices understanding of current disease status and treatment options and is in agreement with the current care plan. All questions were answered. The patient knows to call the clinic with any problems, questions or concerns. We can certainly see the patient much sooner if necessary           No orders of the defined types were placed in this encounter.    The total time spent in the appointment was 20-29 minutes  Ronelle Smallman L Kaysi Ourada, PA-C 04/24/23

## 2023-04-22 ENCOUNTER — Ambulatory Visit: Payer: Federal, State, Local not specified - PPO | Admitting: Internal Medicine

## 2023-04-22 ENCOUNTER — Ambulatory Visit: Payer: Federal, State, Local not specified - PPO

## 2023-04-22 ENCOUNTER — Other Ambulatory Visit: Payer: Federal, State, Local not specified - PPO

## 2023-04-24 ENCOUNTER — Inpatient Hospital Stay: Payer: Federal, State, Local not specified - PPO

## 2023-04-24 ENCOUNTER — Inpatient Hospital Stay: Payer: Federal, State, Local not specified - PPO | Attending: Internal Medicine

## 2023-04-24 ENCOUNTER — Inpatient Hospital Stay: Payer: Federal, State, Local not specified - PPO | Admitting: Physician Assistant

## 2023-04-24 VITALS — BP 124/72 | HR 71 | Temp 97.8°F | Resp 14 | Wt 147.8 lb

## 2023-04-24 DIAGNOSIS — C3411 Malignant neoplasm of upper lobe, right bronchus or lung: Secondary | ICD-10-CM

## 2023-04-24 DIAGNOSIS — Z5112 Encounter for antineoplastic immunotherapy: Secondary | ICD-10-CM | POA: Insufficient documentation

## 2023-04-24 DIAGNOSIS — C7951 Secondary malignant neoplasm of bone: Secondary | ICD-10-CM | POA: Insufficient documentation

## 2023-04-24 LAB — CMP (CANCER CENTER ONLY)
ALT: 12 U/L (ref 0–44)
AST: 22 U/L (ref 15–41)
Albumin: 3.9 g/dL (ref 3.5–5.0)
Alkaline Phosphatase: 73 U/L (ref 38–126)
Anion gap: 6 (ref 5–15)
BUN: 23 mg/dL (ref 8–23)
CO2: 28 mmol/L (ref 22–32)
Calcium: 9.8 mg/dL (ref 8.9–10.3)
Chloride: 101 mmol/L (ref 98–111)
Creatinine: 0.8 mg/dL (ref 0.44–1.00)
GFR, Estimated: >60 mL/min (ref >60)
Glucose, Bld: 113 mg/dL — ABNORMAL HIGH (ref 70–99)
Potassium: 4.1 mmol/L (ref 3.5–5.1)
Sodium: 135 mmol/L (ref 135–145)
Total Bilirubin: 0.6 mg/dL (ref 0.3–1.2)
Total Protein: 7.2 g/dL (ref 6.5–8.1)

## 2023-04-24 LAB — CBC WITH DIFFERENTIAL (CANCER CENTER ONLY)
Abs Immature Granulocytes: 0.02 10*3/uL (ref 0.00–0.07)
Basophils Absolute: 0 10*3/uL (ref 0.0–0.1)
Basophils Relative: 0 %
Eosinophils Absolute: 0.2 10*3/uL (ref 0.0–0.5)
Eosinophils Relative: 3 %
HCT: 41.4 % (ref 36.0–46.0)
Hemoglobin: 14.2 g/dL (ref 12.0–15.0)
Immature Granulocytes: 0 %
Lymphocytes Relative: 13 %
Lymphs Abs: 1.1 10*3/uL (ref 0.7–4.0)
MCH: 33.2 pg (ref 26.0–34.0)
MCHC: 34.3 g/dL (ref 30.0–36.0)
MCV: 96.7 fL (ref 80.0–100.0)
Monocytes Absolute: 0.8 10*3/uL (ref 0.1–1.0)
Monocytes Relative: 9 %
Neutro Abs: 6.7 10*3/uL (ref 1.7–7.7)
Neutrophils Relative %: 75 %
Platelet Count: 240 10*3/uL (ref 150–400)
RBC: 4.28 MIL/uL (ref 3.87–5.11)
RDW: 13.2 % (ref 11.5–15.5)
WBC Count: 8.8 10*3/uL (ref 4.0–10.5)
nRBC: 0 % (ref 0.0–0.2)

## 2023-04-24 MED ORDER — HEPARIN SOD (PORK) LOCK FLUSH 100 UNIT/ML IV SOLN
500.0000 [IU] | Freq: Once | INTRAVENOUS | Status: AC | PRN
  Administered 2023-04-24: 500 [IU]

## 2023-04-24 MED ORDER — SODIUM CHLORIDE 0.9% FLUSH
10.0000 mL | INTRAVENOUS | Status: DC | PRN
  Administered 2023-04-24: 10 mL

## 2023-04-24 MED ORDER — SODIUM CHLORIDE 0.9 % IV SOLN
1500.0000 mg | Freq: Once | INTRAVENOUS | Status: AC
  Administered 2023-04-24: 1500 mg via INTRAVENOUS
  Filled 2023-04-24: qty 30

## 2023-04-24 MED ORDER — SODIUM CHLORIDE 0.9 % IV SOLN: Freq: Once | INTRAVENOUS | Status: AC

## 2023-04-24 NOTE — Patient Instructions (Signed)
[facility]  Discharge Instructions: Thank you for choosing [facility] to provide your oncology and hematology care.   If you have a lab appointment with the Cancer Center, please go directly to the Cancer Center and check in at the registration area.   Wear comfortable clothing and clothing appropriate for easy access to any Portacath or PICC line.   We strive to give you quality time with your provider. You may need to reschedule your appointment if you arrive late (15 or more minutes).  Arriving late affects you and other patients whose appointments are after yours.  Also, if you miss three or more appointments without notifying the office, you may be dismissed from the clinic at the provider's discretion.      For prescription refill requests, have your pharmacy contact our office and allow 72 hours for refills to be completed.    Today you received the following chemotherapy and/or immunotherapy agents imfinzi      To help prevent nausea and vomiting after your treatment, we encourage you to take your nausea medication as directed.  BELOW ARE SYMPTOMS THAT SHOULD BE REPORTED IMMEDIATELY: *FEVER GREATER THAN 100.4 F (38 C) OR HIGHER *CHILLS OR SWEATING *NAUSEA AND VOMITING THAT IS NOT CONTROLLED WITH YOUR NAUSEA MEDICATION *UNUSUAL SHORTNESS OF BREATH *UNUSUAL BRUISING OR BLEEDING *URINARY PROBLEMS (pain or burning when urinating, or frequent urination) *BOWEL PROBLEMS (unusual diarrhea, constipation, pain near the anus) TENDERNESS IN MOUTH AND THROAT WITH OR WITHOUT PRESENCE OF ULCERS (sore throat, sores in mouth, or a toothache) UNUSUAL RASH, SWELLING OR PAIN  UNUSUAL VAGINAL DISCHARGE OR ITCHING   Items with * indicate a potential emergency and should be followed up as soon as possible or go to the Emergency Department if any problems should occur.  Please show the CHEMOTHERAPY ALERT CARD or IMMUNOTHERAPY ALERT CARD at check-in  to the Emergency Department and triage nurse.  Should you have questions after your visit or need to cancel or reschedule your appointment, please contact [facility]  Dept: [phone]  and follow the prompts.  Office hours are 8:00 a.m. to 4:30 p.m. Monday - Friday. Please note that voicemails left after 4:00 p.m. may not be returned until the following business day.  We are closed weekends and major holidays. You have access to a nurse at all times for urgent questions. Please call the main number to the clinic Dept: [phone] and follow the prompts.   For any non-urgent questions, you may also contact your provider using MyChart. We now offer e-Visits for anyone 18 and older to request care online for non-urgent symptoms. For details visit [email or web address].   Also download the MyChart app! Go to the app store, search "MyChart", open the app, select [facility], and log in with your MyChart username and password.   

## 2023-04-25 ENCOUNTER — Ambulatory Visit: Payer: Federal, State, Local not specified - PPO

## 2023-04-25 ENCOUNTER — Telehealth: Payer: Self-pay | Admitting: Internal Medicine

## 2023-04-25 ENCOUNTER — Ambulatory Visit: Payer: Federal, State, Local not specified - PPO | Admitting: Internal Medicine

## 2023-04-25 ENCOUNTER — Other Ambulatory Visit: Payer: Federal, State, Local not specified - PPO

## 2023-04-25 NOTE — Telephone Encounter (Signed)
Called patient regarding AUGUST AND SEPTEMBER APPOINTMENTS, PATIENT IS NOTIFIED

## 2023-04-29 ENCOUNTER — Other Ambulatory Visit: Payer: Self-pay

## 2023-05-09 ENCOUNTER — Other Ambulatory Visit: Payer: Self-pay

## 2023-05-20 ENCOUNTER — Ambulatory Visit: Payer: Federal, State, Local not specified - PPO

## 2023-05-20 ENCOUNTER — Other Ambulatory Visit: Payer: Federal, State, Local not specified - PPO

## 2023-05-20 ENCOUNTER — Ambulatory Visit: Payer: Federal, State, Local not specified - PPO | Admitting: Internal Medicine

## 2023-05-22 ENCOUNTER — Ambulatory Visit: Payer: Federal, State, Local not specified - PPO | Admitting: Physician Assistant

## 2023-05-23 ENCOUNTER — Ambulatory Visit: Payer: Federal, State, Local not specified - PPO

## 2023-05-23 ENCOUNTER — Other Ambulatory Visit: Payer: Federal, State, Local not specified - PPO

## 2023-05-23 ENCOUNTER — Inpatient Hospital Stay: Payer: Federal, State, Local not specified - PPO

## 2023-05-23 ENCOUNTER — Ambulatory Visit: Payer: Federal, State, Local not specified - PPO | Admitting: Internal Medicine

## 2023-05-23 ENCOUNTER — Inpatient Hospital Stay: Payer: Federal, State, Local not specified - PPO | Attending: Internal Medicine | Admitting: Internal Medicine

## 2023-05-23 VITALS — BP 146/73 | HR 73 | Temp 97.4°F | Resp 17 | Ht 62.0 in | Wt 149.1 lb

## 2023-05-23 DIAGNOSIS — Z9221 Personal history of antineoplastic chemotherapy: Secondary | ICD-10-CM | POA: Diagnosis not present

## 2023-05-23 DIAGNOSIS — Z5112 Encounter for antineoplastic immunotherapy: Secondary | ICD-10-CM | POA: Diagnosis present

## 2023-05-23 DIAGNOSIS — C3411 Malignant neoplasm of upper lobe, right bronchus or lung: Secondary | ICD-10-CM

## 2023-05-23 DIAGNOSIS — C778 Secondary and unspecified malignant neoplasm of lymph nodes of multiple regions: Secondary | ICD-10-CM | POA: Insufficient documentation

## 2023-05-23 DIAGNOSIS — Z79899 Other long term (current) drug therapy: Secondary | ICD-10-CM | POA: Insufficient documentation

## 2023-05-23 DIAGNOSIS — C7801 Secondary malignant neoplasm of right lung: Secondary | ICD-10-CM | POA: Insufficient documentation

## 2023-05-23 DIAGNOSIS — Z95828 Presence of other vascular implants and grafts: Secondary | ICD-10-CM

## 2023-05-23 DIAGNOSIS — K5900 Constipation, unspecified: Secondary | ICD-10-CM | POA: Insufficient documentation

## 2023-05-23 DIAGNOSIS — C7951 Secondary malignant neoplasm of bone: Secondary | ICD-10-CM | POA: Diagnosis not present

## 2023-05-23 DIAGNOSIS — R5383 Other fatigue: Secondary | ICD-10-CM | POA: Insufficient documentation

## 2023-05-23 DIAGNOSIS — C7802 Secondary malignant neoplasm of left lung: Secondary | ICD-10-CM | POA: Diagnosis not present

## 2023-05-23 DIAGNOSIS — G629 Polyneuropathy, unspecified: Secondary | ICD-10-CM | POA: Diagnosis not present

## 2023-05-23 LAB — CBC WITH DIFFERENTIAL (CANCER CENTER ONLY)
Abs Immature Granulocytes: 0.02 10*3/uL (ref 0.00–0.07)
Basophils Absolute: 0 10*3/uL (ref 0.0–0.1)
Basophils Relative: 0 %
Eosinophils Absolute: 0.3 10*3/uL (ref 0.0–0.5)
Eosinophils Relative: 3 %
HCT: 39.7 % (ref 36.0–46.0)
Hemoglobin: 13.8 g/dL (ref 12.0–15.0)
Immature Granulocytes: 0 %
Lymphocytes Relative: 16 %
Lymphs Abs: 1.5 10*3/uL (ref 0.7–4.0)
MCH: 33.3 pg (ref 26.0–34.0)
MCHC: 34.8 g/dL (ref 30.0–36.0)
MCV: 95.9 fL (ref 80.0–100.0)
Monocytes Absolute: 0.8 10*3/uL (ref 0.1–1.0)
Monocytes Relative: 8 %
Neutro Abs: 6.7 10*3/uL (ref 1.7–7.7)
Neutrophils Relative %: 73 %
Platelet Count: 245 10*3/uL (ref 150–400)
RBC: 4.14 MIL/uL (ref 3.87–5.11)
RDW: 13.1 % (ref 11.5–15.5)
WBC Count: 9.3 10*3/uL (ref 4.0–10.5)
nRBC: 0 % (ref 0.0–0.2)

## 2023-05-23 LAB — CMP (CANCER CENTER ONLY)
ALT: 10 U/L (ref 10–47)
AST: 13 U/L — ABNORMAL LOW (ref 15–41)
Albumin: 3.7 g/dL (ref 3.5–5.0)
Alkaline Phosphatase: 88 U/L (ref 38–126)
Anion gap: 6 (ref 5–15)
BUN: 12 mg/dL (ref 8–23)
CO2: 29 mmol/L (ref 22–32)
Calcium: 8.6 mg/dL — ABNORMAL LOW (ref 8.9–10.3)
Chloride: 104 mmol/L (ref 98–111)
Creatinine: 0.79 mg/dL (ref 0.60–1.20)
GFR, Estimated: >60 mL/min (ref >60)
Glucose, Bld: 88 mg/dL (ref 70–99)
Potassium: 3.8 mmol/L (ref 3.5–5.1)
Sodium: 139 mmol/L (ref 135–145)
Total Bilirubin: 0.8 mg/dL (ref 0.3–1.2)
Total Protein: 7.5 g/dL (ref 6.5–8.1)

## 2023-05-23 MED ORDER — SODIUM CHLORIDE 0.9 % IV SOLN
1500.0000 mg | Freq: Once | INTRAVENOUS | Status: AC
  Administered 2023-05-23: 1500 mg via INTRAVENOUS
  Filled 2023-05-23: qty 30

## 2023-05-23 MED ORDER — SODIUM CHLORIDE 0.9% FLUSH
10.0000 mL | INTRAVENOUS | Status: DC | PRN
  Administered 2023-05-23: 10 mL

## 2023-05-23 MED ORDER — HEPARIN SOD (PORK) LOCK FLUSH 100 UNIT/ML IV SOLN
500.0000 [IU] | Freq: Once | INTRAVENOUS | Status: AC | PRN
  Administered 2023-05-23: 500 [IU]

## 2023-05-23 MED ORDER — SODIUM CHLORIDE 0.9 % IV SOLN: Freq: Once | INTRAVENOUS | Status: AC

## 2023-05-23 NOTE — Progress Notes (Signed)
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   PRIOR THERAPY: None   CURRENT THERAPY: Palliative systemic chemotherapy with carboplatin for AUC of 5 on day 1, etoposide 100 mg/M2 on days 1, 2 and 3 as well as Imfinzi 1500 mg every 3 weeks with the chemotherapy and Neulasta support. First dose on 03/16/2019. Status post 55 cycles.   Starting from cycle #5 the patient will be treated with maintenance treatment with Imfinzi 1500 mg IV every 4 weeks.  INTERVAL HISTORY: Brittany Marks 82 y.o. female returns to the clinic today for follow-up visit.  The patient is feeling fine today with no concerning complaints except for the baseline fatigue but no significant chest pain, shortness of breath, cough or hemoptysis.  She has no nausea, vomiting, diarrhea or constipation.  She has no headache or visual changes.  She denied having any recent weight loss or night sweats.  She is here today for evaluation before starting cycle #56 of her treatment.  MEDICAL HISTORY: Past Medical History:  Diagnosis Date   Cataract    Bilateral   Constipation    pain medication   Left rotator cuff tear    Right rotator cuff tear    SCL CA dx'd 11/2018    ALLERGIES:  is allergic to oxycodone.  MEDICATIONS:  Current Outpatient Medications  Medication Sig Dispense Refill   albuterol (VENTOLIN HFA) 108 (90 Base) MCG/ACT inhaler Inhale 2 puffs into the lungs every 6 (six) hours as needed for wheezing or shortness of breath. 8 g 2   Ascorbic Acid (VITAMIN C PO) Take by mouth daily.      b complex vitamins tablet Take 1 tablet by mouth  daily.     COD LIVER OIL PO Take 1 tablet by mouth daily.      gabapentin (NEURONTIN) 100 MG capsule Take 1 capsule (100 mg total) by mouth 2 (two) times daily. 90 capsule 2   hydrocortisone 2.5 % cream Apply topically as needed. (Patient taking differently: Apply 1 application  topically daily as needed (Treatment).) 3.5 g 0   ibuprofen (ADVIL) 200 MG tablet Take 600 mg by mouth daily.     lidocaine-prilocaine (EMLA) cream Apply 1 Application topically every 30 (thirty) days. 30 g 2   methylPREDNISolone (MEDROL DOSEPAK) 4 MG TBPK tablet Use as instructed 21 tablet 0   Multiple Vitamin (MULTIVITAMIN) tablet Take 1 tablet by mouth daily.     ondansetron (ZOFRAN) 4 MG tablet Take 1 tablet (4 mg total) by mouth every 8 (eight) hours as needed for nausea or vomiting. 10 tablet 0   oxymetazoline (AFRIN) 0.05 % nasal spray Place 2 sprays into the nose 2 (two) times daily as needed (Bloody nose).     prochlorperazine (COMPAZINE) 10 MG tablet Take 1 tablet (10 mg total) by mouth every 6 (six) hours as needed for nausea or vomiting. 30 tablet 0   VITAMIN E PO Take 1 tablet by mouth daily.      No current facility-administered medications for this visit.   Facility-Administered Medications Ordered in Other Visits  Medication Dose Route Frequency Provider Last Rate Last Admin  heparin lock flush 100 unit/mL  500 Units Intracatheter Once PRN Magrinat, Valentino Hue, MD       sodium chloride flush (NS) 0.9 % injection 10 mL  10 mL Intracatheter PRN Magrinat, Valentino Hue, MD        SURGICAL HISTORY:  Past Surgical History:  Procedure Laterality Date   CATARACT EXTRACTION W/ INTRAOCULAR LENS  IMPLANT, BILATERAL  2012   DECOMPRESSIVE LUMBAR LAMINECTOMY LEVEL 2 N/A 07/29/2013   Procedure: LUMBAR LAMINECTOMY, DECOMPRESSION LUMBAR THREE TO FOUR, FOUR TO FIVE microdiscectomy l3,4 right;  Surgeon: Jacki Cones, MD;  Location: [facility];  Service: Orthopedics;  Laterality: N/A;   I & D SUPERIOR RIGHT SHOULDER AND  CLOSURE WOUND  01-10-2011   S/P ROTATOR CUFF REPAIR   IR IMAGING GUIDED PORT INSERTION  04/09/2019   LUMBAR LAMINECTOMY  1970'S   LUMBAR LAMINECTOMY/DECOMPRESSION MICRODISCECTOMY N/A 06/27/2016   Procedure: L1 - L2 DISCECTOMY;  Surgeon: Venita Lick, MD;  Location: [facility];  Service: Orthopedics;  Laterality: N/A;   LUMBAR SPINE SURGERY  1983   REVERSE SHOULDER ARTHROPLASTY Left 03/10/2020   Procedure: REVERSE SHOULDER ARTHROPLASTY;  Surgeon: Francena Hanly, MD;  Location: [facility];  Service: Orthopedics;  Laterality: Left;    RIGHT SHOULDER ARTHROSCOPY/ OPEN DISTAL CLAVICLE RESECTION/ SAD/ OPEN ROTATOR CUFF REPAIR  11-28-2010   SHOULDER FUSION SURGERY  03/11/2020   Shoulder Surgery , hardware placed   SHOULDER OPEN ROTATOR CUFF REPAIR  12/19/2011   Procedure: ROTATOR CUFF REPAIR SHOULDER OPEN;  Surgeon: Drucilla Schmidt, MD;  Location: [facility];  Service: Orthopedics;  Laterality: Right;  RIGHT RECURRENT OPEN REPAIR OF THE ROTATOR CUFF WITH TISSUE MEND GRAFTANTERIOR CHROMIOECTOMY   VAGINAL HYSTERECTOMY  1979    REVIEW OF SYSTEMS:  A comprehensive review of systems was negative except for: Constitutional: positive for fatigue Respiratory: positive for dyspnea on exertion   PHYSICAL EXAMINATION: General appearance: alert, cooperative, fatigued, and no distress Head: Normocephalic, without obvious abnormality, atraumatic Neck: no adenopathy, no JVD, supple, symmetrical, trachea midline, and thyroid not enlarged, symmetric, no tenderness/mass/nodules Lymph nodes: Cervical, supraclavicular, and axillary nodes normal. Resp: clear to auscultation bilaterally Back: symmetric, no curvature. ROM normal. No CVA tenderness. Cardio: regular rate and rhythm, S1, S2 normal, no murmur, click, rub or gallop GI: soft, non-tender; bowel sounds normal; no masses,  no organomegaly Extremities: extremities normal, atraumatic, no cyanosis or edema   ECOG PERFORMANCE STATUS: 1 - Symptomatic  but completely ambulatory  Blood pressure (!) 146/73, pulse 73, temperature (!) 97.4 F (36.3 C), temperature source Oral, resp. rate 17, height 5\' 2"  (1.575 m), weight 149 lb 1.6 oz (67.6 kg), SpO2 92%.  LABORATORY DATA: Lab Results  Component Value Date   WBC 8.8 04/24/2023   HGB 14.2 04/24/2023   HCT 41.4 04/24/2023   MCV 96.7 04/24/2023   PLT 240 04/24/2023      Chemistry      Component Value Date/Time   NA 135 04/24/2023 1407   K 4.1 04/24/2023 1407   CL 101 04/24/2023 1407   CO2 28 04/24/2023 1407   BUN 23 04/24/2023 1407   CREATININE 0.80 04/24/2023 1407      Component Value Date/Time   CALCIUM 9.8 04/24/2023 1407   ALKPHOS 73 04/24/2023 1407   AST 22 04/24/2023 1407   ALT 12 04/24/2023 1407   BILITOT 0.6 04/24/2023 1407       RADIOGRAPHIC STUDIES: No results found.   ASSESSMENT AND PLAN: This is a very pleasant 82 years  old white female with extensive stage small cell lung cancer and she is currently undergoing treatment with carboplatin, etoposide and Imfinzi status post 5 cycles.  Starting from cycle #6 the patient is on maintenance treatment with single agent Imfinzi every 4 weeks status post 55 cycles of total treatment. The patient has been tolerating this treatment well with no concerning adverse effects. I recommended for her to proceed with cycle #56 today as planned. I will see her back for follow-up visit in 4 weeks for evaluation before the next cycle of her treatment. She was advised to call immediately if she has any other concerning symptoms in the interval. For the peripheral neuropathy, she will continue her current treatment with gabapentin and I will give her a refill of her medication.  The patient voices understanding of current disease status and treatment options and is in agreement with the current care plan.  All questions were answered. The patient knows to call the clinic with any problems, questions or concerns. We can certainly see  the patient much sooner if necessary. The total time spent in the appointment was 20 minutes.  Disclaimer: This note was dictated with voice recognition software. Similar sounding words can inadvertently be transcribed and may not be corrected upon review.

## 2023-05-23 NOTE — Patient Instructions (Signed)
[facility]  Discharge Instructions: Thank you for choosing [facility] to provide your oncology and hematology care.   If you have a lab appointment with the Cancer Center, please go directly to the Cancer Center and check in at the registration area.   Wear comfortable clothing and clothing appropriate for easy access to any Portacath or PICC line.   We strive to give you quality time with your provider. You may need to reschedule your appointment if you arrive late (15 or more minutes).  Arriving late affects you and other patients whose appointments are after yours.  Also, if you miss three or more appointments without notifying the office, you may be dismissed from the clinic at the provider's discretion.      For prescription refill requests, have your pharmacy contact our office and allow 72 hours for refills to be completed.    Today you received the following chemotherapy and/or immunotherapy agents imfinzi      To help prevent nausea and vomiting after your treatment, we encourage you to take your nausea medication as directed.  BELOW ARE SYMPTOMS THAT SHOULD BE REPORTED IMMEDIATELY: *FEVER GREATER THAN 100.4 F (38 C) OR HIGHER *CHILLS OR SWEATING *NAUSEA AND VOMITING THAT IS NOT CONTROLLED WITH YOUR NAUSEA MEDICATION *UNUSUAL SHORTNESS OF BREATH *UNUSUAL BRUISING OR BLEEDING *URINARY PROBLEMS (pain or burning when urinating, or frequent urination) *BOWEL PROBLEMS (unusual diarrhea, constipation, pain near the anus) TENDERNESS IN MOUTH AND THROAT WITH OR WITHOUT PRESENCE OF ULCERS (sore throat, sores in mouth, or a toothache) UNUSUAL RASH, SWELLING OR PAIN  UNUSUAL VAGINAL DISCHARGE OR ITCHING   Items with * indicate a potential emergency and should be followed up as soon as possible or go to the Emergency Department if any problems should occur.  Please show the CHEMOTHERAPY ALERT CARD or IMMUNOTHERAPY ALERT CARD at check-in  to the Emergency Department and triage nurse.  Should you have questions after your visit or need to cancel or reschedule your appointment, please contact [facility]  Dept: [phone]  and follow the prompts.  Office hours are 8:00 a.m. to 4:30 p.m. Monday - Friday. Please note that voicemails left after 4:00 p.m. may not be returned until the following business day.  We are closed weekends and major holidays. You have access to a nurse at all times for urgent questions. Please call the main number to the clinic Dept: [phone] and follow the prompts.   For any non-urgent questions, you may also contact your provider using MyChart. We now offer e-Visits for anyone 18 and older to request care online for non-urgent symptoms. For details visit [email or web address].   Also download the MyChart app! Go to the app store, search "MyChart", open the app, select [facility], and log in with your MyChart username and password.   

## 2023-05-25 ENCOUNTER — Other Ambulatory Visit: Payer: Self-pay

## 2023-05-27 ENCOUNTER — Other Ambulatory Visit: Payer: Self-pay | Admitting: Medical Oncology

## 2023-05-27 DIAGNOSIS — G6289 Other specified polyneuropathies: Secondary | ICD-10-CM

## 2023-05-27 MED ORDER — GABAPENTIN 100 MG PO CAPS
100.0000 mg | ORAL_CAPSULE | Freq: Two times a day (BID) | ORAL | 2 refills | Status: DC
Start: 2023-05-27 — End: 2023-12-03

## 2023-05-27 NOTE — Telephone Encounter (Signed)
Gabapentin refill requested.

## 2023-05-29 ENCOUNTER — Other Ambulatory Visit: Payer: Self-pay | Admitting: Medical Oncology

## 2023-06-14 NOTE — Progress Notes (Unsigned)
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     PRIOR THERAPY: 1) Palliative radiotherapy to the left proximal humerus under the care of Dr. Mitzi Hansen. 2) Additional palliative radiotherapy to the proximal humerus under the care of Dr. Mitzi Hansen. Last treatment on 04/21/2020  CURRENT THERAPY: Palliative systemic chemotherapy with carboplatin for AUC of 5 on day 1, etoposide 100 mg/M2 on days 1, 2 and 3 as well as Imfinzi 1500 mg every 3 weeks with the chemotherapy and Neulasta support. First dose on 03/16/2019. Status post 56 cycles. Starting from cycle #5 the patient will be treated with maintenance treatment with Imfinzi 1500 mg IV every 4 weeks.    INTERVAL HISTORY: Brittany ALBALADEJO 82 y.o. female returns to the clinic today for a follow-up visit.  The patient is feeling fine today without any concerning complaints. She reports baseline fatigue. The patient denies any recent fever, chills, or night sweats. She reports her chronic cough, mostly at night which is stable compared to her baseline.  She reports stable dyspnea on exertion. She denies any chest pain or hemoptysis.  She denies any nausea, vomiting, diarrhea, or constipation. She denies any headaches or visual changes. She denies any rashes. She is here for evaluation and repeat blood work before undergoing cycle #57.   MEDICAL HISTORY: Past Medical History:  Diagnosis Date   Cataract    Bilateral   Constipation    pain medication   Left rotator cuff tear    Right rotator cuff tear    SCL CA dx'd 11/2018    ALLERGIES:  is allergic to oxycodone.  MEDICATIONS:  Current Outpatient Medications  Medication Sig  Dispense Refill   albuterol (VENTOLIN HFA) 108 (90 Base) MCG/ACT inhaler Inhale 2 puffs into the lungs every 6 (six) hours as needed for wheezing or shortness of breath. 8 g 2   Ascorbic Acid (VITAMIN C PO) Take by mouth daily.      b complex vitamins tablet Take 1 tablet by mouth daily.     COD LIVER OIL PO Take 1 tablet by mouth daily.      gabapentin (NEURONTIN) 100 MG capsule Take 1 capsule (100 mg total) by mouth 2 (two) times daily. 90 capsule 2   hydrocortisone 2.5 % cream Apply topically as needed. (Patient taking differently: Apply 1 application  topically daily as needed (Treatment).) 3.5 g 0   ibuprofen (ADVIL) 200 MG tablet Take 600 mg by mouth daily.     lidocaine-prilocaine (EMLA) cream Apply 1 Application topically every 30 (thirty) days. 30 g 2   Multiple Vitamin (MULTIVITAMIN) tablet Take 1 tablet by mouth daily.     oxymetazoline (AFRIN) 0.05 % nasal spray Place 2 sprays into the nose 2 (two) times daily as needed (Bloody nose).     VITAMIN E PO Take 1 tablet by mouth daily.      ondansetron (ZOFRAN) 4 MG tablet Take 1 tablet (4 mg total) by mouth every 8 (eight) hours as needed for nausea or vomiting. 10 tablet 0   prochlorperazine (COMPAZINE) 10 MG tablet Take 1 tablet (10 mg total) by mouth every 6 (six) hours as needed for nausea or vomiting. 30 tablet 0  No current facility-administered medications for this visit.   Facility-Administered Medications Ordered in Other Visits  Medication Dose Route Frequency Provider Last Rate Last Admin   heparin lock flush 100 unit/mL  500 Units Intracatheter Once PRN Magrinat, Valentino Hue, MD       sodium chloride flush (NS) 0.9 % injection 10 mL  10 mL Intracatheter PRN Magrinat, Valentino Hue, MD        SURGICAL HISTORY:  Past Surgical History:  Procedure Laterality Date   CATARACT EXTRACTION W/ INTRAOCULAR LENS  IMPLANT, BILATERAL  2012   DECOMPRESSIVE LUMBAR LAMINECTOMY LEVEL 2 N/A 07/29/2013   Procedure: LUMBAR LAMINECTOMY,  DECOMPRESSION LUMBAR THREE TO FOUR, FOUR TO FIVE microdiscectomy l3,4 right;  Surgeon: Jacki Cones, MD;  Location: [facility];  Service: Orthopedics;  Laterality: N/A;   I & D SUPERIOR RIGHT SHOULDER AND CLOSURE WOUND  01-10-2011   S/P ROTATOR CUFF REPAIR   IR IMAGING GUIDED PORT INSERTION  04/09/2019   LUMBAR LAMINECTOMY  1970'S   LUMBAR LAMINECTOMY/DECOMPRESSION MICRODISCECTOMY N/A 06/27/2016   Procedure: L1 - L2 DISCECTOMY;  Surgeon: Venita Lick, MD;  Location: [facility];  Service: Orthopedics;  Laterality: N/A;   LUMBAR SPINE SURGERY  1983   REVERSE SHOULDER ARTHROPLASTY Left 03/10/2020   Procedure: REVERSE SHOULDER ARTHROPLASTY;  Surgeon: Francena Hanly, MD;  Location: [facility];  Service: Orthopedics;  Laterality: Left;    RIGHT SHOULDER ARTHROSCOPY/ OPEN DISTAL CLAVICLE RESECTION/ SAD/ OPEN ROTATOR CUFF REPAIR  11-28-2010   SHOULDER FUSION SURGERY  03/11/2020   Shoulder Surgery , hardware placed   SHOULDER OPEN ROTATOR CUFF REPAIR  12/19/2011   Procedure: ROTATOR CUFF REPAIR SHOULDER OPEN;  Surgeon: Drucilla Schmidt, MD;  Location:  SURGERY CENTER;  Service: Orthopedics;  Laterality: Right;  RIGHT RECURRENT OPEN REPAIR OF THE ROTATOR CUFF WITH TISSUE MEND GRAFTANTERIOR CHROMIOECTOMY   VAGINAL HYSTERECTOMY  1979    REVIEW OF SYSTEMS:   Constitutional: Positive for fatigue. Negative for appetite change, chills, fever and unexpected weight change.  HENT: Negative for mouth sores, nosebleeds, sore throat and trouble swallowing.  Eyes: Negative for eye problems and icterus.  Respiratory: Positive for stable dyspnea on exertion and chronic cough (mostly at night). Negative for hemoptysis and wheezing.   Cardiovascular: Negative for chest pain and leg swelling.  Gastrointestinal: Negative for abdominal pain, recent diarrhea, constipation, diarrhea, nausea and vomiting.  Genitourinary: Negative for bladder incontinence, difficulty urinating, dysuria, frequency and hematuria.    Musculoskeletal: Positive for chronic back pain. Positive for knee pain. Negative for gait problem, neck pain and neck stiffness.  Skin: Negative for itching and rash.  Neurological: Negative for dizziness, extremity weakness, gait problem, headaches, light-headedness and seizures.  Hematological: Negative for adenopathy. Does not bruise/bleed easily.  Psychiatric/Behavioral: Positive for trouble sleeping the day before infusions. Negative for confusion, depression and sleep disturbance. The patient is not nervous/anxious       PHYSICAL EXAMINATION:  Blood pressure (!) 147/84, pulse 81, temperature 97.6 F (36.4 C), resp. rate 16, weight 147 lb 14.4 oz (67.1 kg), SpO2 90%.  ECOG PERFORMANCE STATUS: 2-3  Physical Exam  Constitutional: Oriented to person, place, and time and chronically ill appearing female and in no distress.  HENT:  Head: Normocephalic and atraumatic.  Mouth/Throat: Oropharynx is clear and moist. No oropharyngeal exudate.  Eyes: Conjunctivae are normal. Right eye exhibits no discharge. Left eye exhibits no discharge. No scleral icterus.  Neck: Normal range of motion. Neck supple.  Cardiovascular: Normal rate, regular rhythm, normal heart sounds.  Pulmonary/Chest: Effort normal. Crackles noted at lung bases bilaterally. No respiratory distress.  Abdominal: Soft. Exhibits no distension and no mass. There is no tenderness.  Musculoskeletal: Normal range of motion. Exhibits no edema.  Lymphadenopathy:    No cervical adenopathy.  Neurological: Alert and oriented to person, place, and time. Exhibits muscle wasting. Examined in the wheelchair.  Skin: Skin is warm and dry. No rash noted. Not diaphoretic. No erythema. No pallor.  Psychiatric: Mood, memory and judgment normal.  Vitals reviewed.  LABORATORY DATA: Lab Results  Component Value Date   WBC 8.9 06/18/2023   HGB 14.0 06/18/2023   HCT 40.1 06/18/2023   MCV 95.5 06/18/2023   PLT 240 06/18/2023       Chemistry      Component Value Date/Time   NA 134 (L) 06/18/2023 1428   K 4.0 06/18/2023 1428   CL 102 06/18/2023 1428   CO2 26 06/18/2023 1428   BUN 19 06/18/2023 1428   CREATININE 0.75 06/18/2023 1428      Component Value Date/Time   CALCIUM 9.3 06/18/2023 1428   ALKPHOS 78 06/18/2023 1428   AST 21 06/18/2023 1428   ALT 13 06/18/2023 1428   BILITOT 0.5 06/18/2023 1428       RADIOGRAPHIC STUDIES:  No results found.   ASSESSMENT/PLAN:  This is a very pleasant 82 year old Caucasian female diagnosed with extensive stage (T1b, N3, M1 C) small cell lung cancer.  She presented with right upper lobe pulmonary nodules in addition to bilateral hilar and right mediastinal as well as left supraclavicular and abdominal lymphadenopathy.  She also has metastatic disease to the in the left humerus.  She was diagnosed in May 2020.   She is status post palliative radiotherapy to the left proximal humerus under the care of Dr. Mitzi Hansen in 2020.   She is currently undergoing palliative systemic chemotherapy with carboplatin for AUC of 5 on day 1, etoposide 100 mg/M2 on days 1, 2 and 3 as well as Imfinzi 1500 mg every 3 weeks with the chemotherapy and Neulasta support. Status post 56 cycles. Starting from cycle #5 the patient has been treated with maintenance treatment with Imfinzi 1500 mg IV every 4 weeks.   She had a pathological fracture in the left humerus underwent additional radiation to this area. Last treatment was 04/21/20.   Labs were reviewed. Recommend that she proceed with cycle #57 today as scheduled.   I will arrange for restaging CT scan of the chest, abdomen, pelvis prior to her next appointment.   We will see her back for follow-up visit in 4 weeks for evaluation before starting cycle #58.   The patient was advised to call immediately if she has any concerning symptoms in the interval. The patient voices understanding of current disease status and treatment options and is in  agreement with the current care plan. All questions were answered. The patient knows to call the clinic with any problems, questions or concerns. We can certainly see the patient much sooner if necessary     Orders Placed This Encounter  Procedures   CT CHEST ABDOMEN PELVIS W CONTRAST    Standing Status:   Future    Standing Expiration Date:   06/17/2024    Order Specific Question:   If indicated for the ordered procedure, I authorize the administration of contrast media per Radiology protocol    Answer:   Yes    Order Specific Question:   Does the patient have a contrast media/X-ray dye allergy?  Answer:   No    Order Specific Question:   Preferred imaging location?    Answer:   [facility]    Order Specific Question:   If indicated for the ordered procedure, I authorize the administration of oral contrast media per Radiology protocol    Answer:   Yes      The total time spent in the appointment was 20-29 minutes  Sareen Randon L Rayanna Matusik, PA-C 06/18/23

## 2023-06-18 ENCOUNTER — Ambulatory Visit: Payer: Federal, State, Local not specified - PPO | Admitting: Physician Assistant

## 2023-06-18 ENCOUNTER — Inpatient Hospital Stay: Payer: Federal, State, Local not specified - PPO | Attending: Internal Medicine

## 2023-06-18 ENCOUNTER — Inpatient Hospital Stay: Payer: Federal, State, Local not specified - PPO

## 2023-06-18 ENCOUNTER — Other Ambulatory Visit: Payer: Federal, State, Local not specified - PPO

## 2023-06-18 ENCOUNTER — Ambulatory Visit: Payer: Federal, State, Local not specified - PPO

## 2023-06-18 ENCOUNTER — Inpatient Hospital Stay (HOSPITAL_BASED_OUTPATIENT_CLINIC_OR_DEPARTMENT_OTHER): Payer: Federal, State, Local not specified - PPO | Attending: Internal Medicine | Admitting: Physician Assistant

## 2023-06-18 VITALS — BP 147/84 | HR 81 | Temp 97.6°F | Resp 16 | Wt 147.9 lb

## 2023-06-18 DIAGNOSIS — C3411 Malignant neoplasm of upper lobe, right bronchus or lung: Secondary | ICD-10-CM | POA: Diagnosis not present

## 2023-06-18 DIAGNOSIS — R0609 Other forms of dyspnea: Secondary | ICD-10-CM | POA: Insufficient documentation

## 2023-06-18 DIAGNOSIS — Z9221 Personal history of antineoplastic chemotherapy: Secondary | ICD-10-CM | POA: Insufficient documentation

## 2023-06-18 DIAGNOSIS — Z79899 Other long term (current) drug therapy: Secondary | ICD-10-CM | POA: Diagnosis not present

## 2023-06-18 DIAGNOSIS — C7951 Secondary malignant neoplasm of bone: Secondary | ICD-10-CM | POA: Insufficient documentation

## 2023-06-18 DIAGNOSIS — K5900 Constipation, unspecified: Secondary | ICD-10-CM | POA: Diagnosis not present

## 2023-06-18 DIAGNOSIS — R053 Chronic cough: Secondary | ICD-10-CM | POA: Insufficient documentation

## 2023-06-18 DIAGNOSIS — Z5112 Encounter for antineoplastic immunotherapy: Secondary | ICD-10-CM | POA: Diagnosis not present

## 2023-06-18 DIAGNOSIS — Z95828 Presence of other vascular implants and grafts: Secondary | ICD-10-CM

## 2023-06-18 DIAGNOSIS — Z923 Personal history of irradiation: Secondary | ICD-10-CM | POA: Insufficient documentation

## 2023-06-18 DIAGNOSIS — Z791 Long term (current) use of non-steroidal anti-inflammatories (NSAID): Secondary | ICD-10-CM | POA: Diagnosis not present

## 2023-06-18 LAB — CMP (CANCER CENTER ONLY)
ALT: 13 U/L (ref 0–44)
AST: 21 U/L (ref 15–41)
Albumin: 3.8 g/dL (ref 3.5–5.0)
Alkaline Phosphatase: 78 U/L (ref 38–126)
Anion gap: 6 (ref 5–15)
BUN: 19 mg/dL (ref 8–23)
CO2: 26 mmol/L (ref 22–32)
Calcium: 9.3 mg/dL (ref 8.9–10.3)
Chloride: 102 mmol/L (ref 98–111)
Creatinine: 0.75 mg/dL (ref 0.44–1.00)
GFR, Estimated: >60 mL/min (ref >60)
Glucose, Bld: 127 mg/dL — ABNORMAL HIGH (ref 70–99)
Potassium: 4 mmol/L (ref 3.5–5.1)
Sodium: 134 mmol/L — ABNORMAL LOW (ref 135–145)
Total Bilirubin: 0.5 mg/dL (ref 0.3–1.2)
Total Protein: 7.1 g/dL (ref 6.5–8.1)

## 2023-06-18 LAB — CBC WITH DIFFERENTIAL (CANCER CENTER ONLY)
Abs Immature Granulocytes: 0.04 10*3/uL (ref 0.00–0.07)
Basophils Absolute: 0.1 10*3/uL (ref 0.0–0.1)
Basophils Relative: 1 %
Eosinophils Absolute: 0.3 10*3/uL (ref 0.0–0.5)
Eosinophils Relative: 3 %
HCT: 40.1 % (ref 36.0–46.0)
Hemoglobin: 14 g/dL (ref 12.0–15.0)
Immature Granulocytes: 0 %
Lymphocytes Relative: 15 %
Lymphs Abs: 1.4 10*3/uL (ref 0.7–4.0)
MCH: 33.3 pg (ref 26.0–34.0)
MCHC: 34.9 g/dL (ref 30.0–36.0)
MCV: 95.5 fL (ref 80.0–100.0)
Monocytes Absolute: 0.7 10*3/uL (ref 0.1–1.0)
Monocytes Relative: 8 %
Neutro Abs: 6.5 10*3/uL (ref 1.7–7.7)
Neutrophils Relative %: 73 %
Platelet Count: 240 10*3/uL (ref 150–400)
RBC: 4.2 MIL/uL (ref 3.87–5.11)
RDW: 13 % (ref 11.5–15.5)
WBC Count: 8.9 10*3/uL (ref 4.0–10.5)
nRBC: 0 % (ref 0.0–0.2)

## 2023-06-18 MED ORDER — SODIUM CHLORIDE 0.9 % IV SOLN: Freq: Once | INTRAVENOUS | Status: AC

## 2023-06-18 MED ORDER — SODIUM CHLORIDE 0.9 % IV SOLN
1500.0000 mg | Freq: Once | INTRAVENOUS | Status: AC
  Administered 2023-06-18: 1500 mg via INTRAVENOUS
  Filled 2023-06-18: qty 30

## 2023-06-18 MED ORDER — SODIUM CHLORIDE 0.9% FLUSH
10.0000 mL | INTRAVENOUS | Status: DC | PRN
  Administered 2023-06-18: 10 mL

## 2023-06-19 ENCOUNTER — Other Ambulatory Visit: Payer: Federal, State, Local not specified - PPO

## 2023-06-19 ENCOUNTER — Ambulatory Visit: Payer: Federal, State, Local not specified - PPO | Admitting: Physician Assistant

## 2023-06-19 ENCOUNTER — Ambulatory Visit: Payer: Federal, State, Local not specified - PPO

## 2023-06-20 ENCOUNTER — Ambulatory Visit: Payer: Federal, State, Local not specified - PPO

## 2023-06-20 ENCOUNTER — Ambulatory Visit: Payer: Federal, State, Local not specified - PPO | Admitting: Physician Assistant

## 2023-06-20 ENCOUNTER — Other Ambulatory Visit: Payer: Federal, State, Local not specified - PPO

## 2023-06-21 ENCOUNTER — Other Ambulatory Visit: Payer: Self-pay

## 2023-07-02 ENCOUNTER — Other Ambulatory Visit: Payer: Self-pay

## 2023-07-10 NOTE — Progress Notes (Signed)
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     PRIOR THERAPY: 1) Palliative radiotherapy to the left proximal humerus under the care of Dr. Mitzi Hansen. 2) Additional palliative radiotherapy to the proximal humerus under the care of Dr. Mitzi Hansen. Last treatment on 04/21/2020  CURRENT THERAPY: Palliative systemic chemotherapy with carboplatin for AUC of 5 on day 1, etoposide 100 mg/M2 on days 1, 2 and 3 as well as Imfinzi 1500 mg every 3 weeks with the chemotherapy and Neulasta support. First dose on 03/16/2019. Status post 57 cycles. Starting from cycle #5 the patient will be treated with maintenance treatment with Imfinzi 1500 mg IV every 4 weeks.   INTERVAL HISTORY: Brittany Marks 82 y.o. female returns to the clinic today for a follow-up visit.  The patient is feeling fine today without any concerning complaints. She reports baseline fatigue. The patient denies any recent fever, chills, or night sweats. She reports her chronic cough, mostly at night which is stable compared to her baseline. She states food does not taste good. She does not have any evidence of thrush. She reports stable dyspnea on exertion. She is wondering what your oxygen level has to be to qualify for supplemental oxygen. She has several comorbities but states she can't afford to see other specialists and often declines recommendations for referrals to other specialties. She denies any chest pain or hemoptysis.  She denies any nausea, vomiting, diarrhea (except for one episode of diarrhea yesterday that was self limiting), or constipation. She denies any headaches or visual changes. She denies any rashes. She  recently had a restaging CT scan.  She is here for evaluation and repeat blood work before undergoing cycle #58.   MEDICAL HISTORY: Past Medical History:  Diagnosis Date   Cataract    Bilateral   Constipation    pain medication   Left rotator cuff tear    Right rotator cuff tear    SCL CA dx'd 11/2018    ALLERGIES:  is allergic to oxycodone.  MEDICATIONS:  Current Outpatient Medications  Medication Sig Dispense Refill   albuterol (VENTOLIN HFA) 108 (90 Base) MCG/ACT inhaler Inhale 2 puffs into the lungs every 6 (six) hours as needed for wheezing or shortness of breath. 8 g 2   Ascorbic Acid (VITAMIN C PO) Take by mouth daily.      b complex vitamins tablet Take 1 tablet by mouth daily.     COD LIVER OIL PO Take 1 tablet by mouth daily.      gabapentin (NEURONTIN) 100 MG capsule Take 1 capsule (100 mg total) by mouth 2 (two) times daily. 90 capsule 2   hydrocortisone 2.5 % cream Apply topically as needed. (Patient taking differently: Apply 1 application  topically daily as needed (Treatment).) 3.5 g 0   ibuprofen (ADVIL) 200 MG tablet Take 600 mg by mouth daily.     lidocaine-prilocaine (EMLA) cream Apply 1 Application topically every 30 (thirty) days. 30 g 2   Multiple Vitamin (MULTIVITAMIN) tablet Take 1 tablet by mouth daily.     ondansetron (ZOFRAN) 4 MG tablet Take 1 tablet (4 mg total) by mouth every 8 (eight) hours as needed for nausea or vomiting. 10 tablet  0   oxymetazoline (AFRIN) 0.05 % nasal spray Place 2 sprays into the nose 2 (two) times daily as needed (Bloody nose).     prochlorperazine (COMPAZINE) 10 MG tablet Take 1 tablet (10 mg total) by mouth every 6 (six) hours as needed for nausea or vomiting. 30 tablet 0   VITAMIN E PO Take 1 tablet by mouth daily.      No current facility-administered medications for this visit.   Facility-Administered Medications Ordered in Other Visits  Medication Dose Route Frequency Provider Last Rate Last Admin   heparin lock flush 100  unit/mL  500 Units Intracatheter Once PRN Magrinat, Valentino Hue, MD       sodium chloride flush (NS) 0.9 % injection 10 mL  10 mL Intracatheter PRN Magrinat, Valentino Hue, MD        SURGICAL HISTORY:  Past Surgical History:  Procedure Laterality Date   CATARACT EXTRACTION W/ INTRAOCULAR LENS  IMPLANT, BILATERAL  2012   DECOMPRESSIVE LUMBAR LAMINECTOMY LEVEL 2 N/A 07/29/2013   Procedure: LUMBAR LAMINECTOMY, DECOMPRESSION LUMBAR THREE TO FOUR, FOUR TO FIVE microdiscectomy l3,4 right;  Surgeon: Jacki Cones, MD;  Location: [facility];  Service: Orthopedics;  Laterality: N/A;   I & D SUPERIOR RIGHT SHOULDER AND CLOSURE WOUND  01-10-2011   S/P ROTATOR CUFF REPAIR   IR IMAGING GUIDED PORT INSERTION  04/09/2019   LUMBAR LAMINECTOMY  1970'S   LUMBAR LAMINECTOMY/DECOMPRESSION MICRODISCECTOMY N/A 06/27/2016   Procedure: L1 - L2 DISCECTOMY;  Surgeon: Venita Lick, MD;  Location: [facility];  Service: Orthopedics;  Laterality: N/A;   LUMBAR SPINE SURGERY  1983   REVERSE SHOULDER ARTHROPLASTY Left 03/10/2020   Procedure: REVERSE SHOULDER ARTHROPLASTY;  Surgeon: Francena Hanly, MD;  Location: [facility];  Service: Orthopedics;  Laterality: Left;    RIGHT SHOULDER ARTHROSCOPY/ OPEN DISTAL CLAVICLE RESECTION/ SAD/ OPEN ROTATOR CUFF REPAIR  11-28-2010   SHOULDER FUSION SURGERY  03/11/2020   Shoulder Surgery , hardware placed   SHOULDER OPEN ROTATOR CUFF REPAIR  12/19/2011   Procedure: ROTATOR CUFF REPAIR SHOULDER OPEN;  Surgeon: Drucilla Schmidt, MD;  Location: [facility];  Service: Orthopedics;  Laterality: Right;  RIGHT RECURRENT OPEN REPAIR OF THE ROTATOR CUFF WITH TISSUE MEND GRAFTANTERIOR CHROMIOECTOMY   VAGINAL HYSTERECTOMY  1979    REVIEW OF SYSTEMS:   Constitutional: Positive for fatigue. Negative for appetite change, chills, fever and unexpected weight change.  HENT: Negative for mouth sores, nosebleeds, sore throat and trouble swallowing.  Eyes: Negative for eye problems and icterus.   Respiratory: Positive for stable dyspnea on exertion and chronic cough (mostly at night). Negative for hemoptysis and wheezing.   Cardiovascular: Negative for chest pain and leg swelling.  Gastrointestinal: Negative for abdominal pain, recent diarrhea, constipation, diarrhea, nausea and vomiting.  Genitourinary: Negative for bladder incontinence, difficulty urinating, dysuria, frequency and hematuria.   Musculoskeletal: Positive for chronic back pain. Positive for knee pain. Negative for gait problem, neck pain and neck stiffness.  Skin: Negative for itching and rash.  Neurological: Negative for dizziness, extremity weakness, gait problem, headaches, light-headedness and seizures.  Hematological: Negative for adenopathy. Does not bruise/bleed easily.  Psychiatric/Behavioral: Positive for trouble sleeping the day before infusions. Negative for confusion, depression and sleep disturbance. The patient is not nervous/anxious     PHYSICAL EXAMINATION:  Blood pressure 136/88, pulse 75, temperature 97.9 F (36.6 C), temperature source Oral, resp. rate 18, weight 149 lb 1 oz (67.6 kg), SpO2 92%.  ECOG PERFORMANCE STATUS: 2-3  Physical Exam  Constitutional: Oriented to person, place, and time and chronically ill appearing female and in no distress.  HENT:  Head: Normocephalic and atraumatic.  Mouth/Throat: Oropharynx is clear and moist. No oropharyngeal exudate.  Eyes: Conjunctivae are normal. Right eye exhibits no discharge. Left eye exhibits no discharge. No scleral icterus.  Neck: Normal range of motion. Neck supple.  Cardiovascular: Normal rate, regular rhythm, normal heart sounds.   Pulmonary/Chest: Effort normal. Crackles noted at lung bases bilaterally. No respiratory distress.  Abdominal: Soft. Exhibits no distension and no mass. There is no tenderness.  Musculoskeletal: Normal range of motion. Exhibits no edema.  Lymphadenopathy:    No cervical adenopathy.  Neurological: Alert and  oriented to person, place, and time. Exhibits muscle wasting. Examined in the wheelchair.  Skin: Skin is warm and dry. No rash noted. Not diaphoretic. No erythema. No pallor.  Psychiatric: Mood, memory and judgment normal.  Vitals reviewed.  LABORATORY DATA: Lab Results  Component Value Date   WBC 10.4 07/16/2023   HGB 14.3 07/16/2023   HCT 40.4 07/16/2023   MCV 94.8 07/16/2023   PLT 231 07/16/2023      Chemistry      Component Value Date/Time   NA 133 (L) 07/16/2023 1240   K 3.9 07/16/2023 1240   CL 100 07/16/2023 1240   CO2 26 07/16/2023 1240   BUN 22 07/16/2023 1240   CREATININE 0.79 07/16/2023 1240      Component Value Date/Time   CALCIUM 10.3 07/16/2023 1240   ALKPHOS 75 07/16/2023 1240   AST 21 07/16/2023 1240   ALT 14 07/16/2023 1240   BILITOT 0.4 07/16/2023 1240       RADIOGRAPHIC STUDIES:  CT CHEST ABDOMEN PELVIS W CONTRAST  Result Date: 07/16/2023 CLINICAL DATA:  Restaging of small cell lung cancer. Ongoing immunotherapy. * Tracking Code: BO * EXAM: CT CHEST, ABDOMEN, AND PELVIS WITH CONTRAST TECHNIQUE: Multidetector CT imaging of the chest, abdomen and pelvis was performed following the standard protocol during bolus administration of intravenous contrast. RADIATION DOSE REDUCTION: This exam was performed according to the departmental dose-optimization program which includes automated exposure control, adjustment of the mA and/or kV according to patient size and/or use of iterative reconstruction technique. CONTRAST:  OMNIPAQUE IOHEXOL 300 MG/ML  SOLN COMPARISON:  Multiple exams, including 03/22/2023 FINDINGS: CT CHEST FINDINGS Cardiovascular: Coronary, aortic arch, and branch vessel atherosclerotic vascular disease. Mild cardiomegaly. Mitral and aortic valve calcifications. Right IJ Port-A-Cath tip: Right atrium. Mediastinum/Nodes: Index right paratracheal node 0.7 cm in short axis on image 22 series 2, formerly measured at 1.0 cm. Prevascular lymph node 0.7  cm in short axis on image 24 series 2, previously measured at 0.9 cm. Overall appearance of mediastinal lymph nodes is modestly improved. Lungs/Pleura: Biapical pleuroparenchymal scarring. Peripheral fibrosis. Some peripheral honeycombing. Cylindrical bronchiectasis in the lower lobes and right middle lobe. Stable pattern of peripheral subpleural density in the left upper lobe presumably from radiation therapy, up to 2.0 cm in thickness, previously measured at 2.1 cm in thickness. A somewhat nodular inferior margin of this process measures 0.9 by 1.2 cm on image 47 of series 4, previously 0.8 by 1.0 cm, attention to this vicinity suggested given the slight increase in prominence of the small component. Musculoskeletal: Verse left shoulder arthroplasty with ill definition and possible destructive findings along the glenoid and glenoid neck, with lucency and potential expansion in this region on image 21 series 4 increased from previous. Some of this appearance could be from old fracture. Markedly severe arthropathy  of the right glenohumeral joint with resorption of much of the right humeral head with concave expansion and and remodeling of the right glenoid. Right distal clavicular resection versus remote osteolysis. Thoracic spondylosis and kyphosis. CT ABDOMEN PELVIS FINDINGS Hepatobiliary: Unremarkable Pancreas: Unremarkable Spleen: Unremarkable Adrenals/Urinary Tract: Both adrenal glands appear normal. Continued marked expansion of the right renal collecting system associated mild to moderate right hydronephrosis. No hydroureter, query chronic UPJ stenosis. There is excretion of contrast from the right kidney into the capacious right collecting system as on prior exams. No focal lesion of the renal parenchyma is identified. Urinary bladder unremarkable. Stomach/Bowel: Small type 1 hiatal hernia. Vascular/Lymphatic: Extensive systemic atherosclerosis is present, including aortoiliac atherosclerotic disease. Dense  atheromatous vascular calcifications proximally in the celiac trunk and SMA without overt occlusion identified. Segmental occlusion of the right common iliac artery with reconstitution distally. Reproductive: Uterus absent.  Adnexa unremarkable. Other: No supplemental non-categorized findings. Musculoskeletal: Lumbar spondylosis and degenerative disc disease with grade 1 retrolisthesis at L1-2 and L2-3, and suspected posterior decompression and right facetectomy L1-2. Posterior decompression at L4. Small umbilical hernia contains adipose tissue. Prominent chondral thinning in the hip joints. IMPRESSION: 1. Stable pattern of peripheral subpleural density in the left upper lobe presumably from radiation therapy, up to 2.0 cm in thickness, previously measured at 2.1 cm in thickness. A somewhat nodular inferior margin of this process measures 0.9 by 1.2 cm, previously 0.8 by 1.0 cm, attention to this vicinity suggested on follow up imaging given the slight increase in prominence of the small component. 2. Mildly reduced size of mediastinal lymph nodes, currently within normal limits. 3. Continued marked expansion of the right renal collecting system associated mild to moderate right hydronephrosis. No hydroureter, query chronic UPJ stenosis. 4. Extensive systemic atherosclerosis, including coronary, aortic arch, and branch vessel atherosclerotic vascular disease. 5. Mild cardiomegaly. Mitral and aortic valve calcifications. 6. Small type 1 hiatal hernia. 7. Bony ill definition of the left glenoid along the screw attachments from the reverse shoulder arthroplasty, cannot exclude osteolysis. Severe right glenohumeral arthropathy including chronic volume loss in the right humeral head and scalloped expanded appearance of the right glenoid. 8. Lumbar spondylosis and degenerative disc disease. Aortic Atherosclerosis (ICD10-I70.0). Electronically Signed   By: Gaylyn Rong M.D.   On: 07/16/2023 09:06      ASSESSMENT/PLAN:  This is a very pleasant 82 year old Caucasian female diagnosed with extensive stage (T1b, N3, M1 C) small cell lung cancer.  She presented with right upper lobe pulmonary nodules in addition to bilateral hilar and right mediastinal as well as left supraclavicular and abdominal lymphadenopathy.  She also has metastatic disease to the in the left humerus.  She was diagnosed in May 2020.   She is status post palliative radiotherapy to the left proximal humerus under the care of Dr. Mitzi Hansen in 2020.   She is currently undergoing palliative systemic chemotherapy with carboplatin for AUC of 5 on day 1, etoposide 100 mg/M2 on days 1, 2 and 3 as well as Imfinzi 1500 mg every 3 weeks with the chemotherapy and Neulasta support. Status post 57 cycles. Starting from cycle #5 the patient has been treated with maintenance treatment with Imfinzi 1500 mg IV every 4 weeks.   She had a pathological fracture in the left humerus underwent additional radiation to this area. Last treatment was 04/21/20.  The patient was seen with Dr. Arbutus Ped today.  Dr. Arbutus Ped personally and independently reviewed the scan and discussed results with the patient today.  The scan showed  no clear evidence for progression. Of course, she has a lot of chronic incidental findings.  Dr. Arbutus Ped recommends she continue on her current treatment at the same dose.   Labs were reviewed. Recommend that she proceed with cycle #58 today as scheduled.   We will see her back for follow-up visit in 4 weeks for evaluation before starting cycle #59.    The patient was advised to call immediately if she has any concerning symptoms in the interval. The patient voices understanding of current disease status and treatment options and is in agreement with the current care plan. All questions were answered. The patient knows to call the clinic with any problems, questions or concerns. We can certainly see the patient much sooner if  necessary  No orders of the defined types were placed in this encounter.    Janeice Stegall L Sheena Simonis, PA-C 07/16/23  ADDENDUM: Hematology/Oncology Attending: I had a face-to-face encounter with the patient today.  I reviewed her records, lab, scan and recommended her care plan.  This is a very pleasant 82 years old white female with extensive stage small cell lung cancer that was diagnosed in May 2020.  The patient started palliative systemic chemotherapy initially with carboplatin, etoposide and Imfinzi for 4 cycles and starting from cycle #5 she has been on maintenance treatment with single agent Imfinzi status post a total treatment of 57 cycles.  She has been tolerating her treatment with immunotherapy fairly well with no concerning adverse effects except for the mild fatigue. She had repeat CT scan of the chest, abdomen and pelvis performed recently.  I personally and independently reviewed the scan and discussed the result with the patient today. Her scan showed no concerning findings for disease progression. I recommended for her to continue her current treatment with maintenance Imfinzi and she will proceed with cycle #58 today. The patient will come back for follow-up visit in 4 weeks for evaluation before the next cycle of her treatment. She was advised to call immediately if she has any other concerning symptoms in the interval. The total time spent in the appointment was 30 minutes. Disclaimer: This Marks was dictated with voice recognition software. Similar sounding words can inadvertently be transcribed and may be missed upon review. Lajuana Matte, MD

## 2023-07-11 ENCOUNTER — Ambulatory Visit (HOSPITAL_COMMUNITY)
Admission: RE | Admit: 2023-07-11 | Discharge: 2023-07-11 | Disposition: A | Discharge status: Home / Self Care | Payer: Federal, State, Local not specified - PPO | Source: Ambulatory Visit | Attending: Physician Assistant | Admitting: Physician Assistant

## 2023-07-11 DIAGNOSIS — C3411 Malignant neoplasm of upper lobe, right bronchus or lung: Secondary | ICD-10-CM | POA: Insufficient documentation

## 2023-07-11 MED ORDER — HEPARIN SOD (PORK) LOCK FLUSH 100 UNIT/ML IV SOLN
500.0000 [IU] | Freq: Once | INTRAVENOUS | Status: AC
  Administered 2023-07-11: 500 [IU] via INTRAVENOUS

## 2023-07-11 MED ORDER — SODIUM CHLORIDE (PF) 0.9 % IJ SOLN
INTRAMUSCULAR | Status: AC
  Filled 2023-07-11: qty 50

## 2023-07-11 MED ORDER — IOHEXOL 300 MG/ML  SOLN
100.0000 mL | Freq: Once | INTRAMUSCULAR | Status: AC | PRN
  Administered 2023-07-11: 100 mL via INTRAVENOUS

## 2023-07-16 ENCOUNTER — Other Ambulatory Visit: Payer: Federal, State, Local not specified - PPO

## 2023-07-16 ENCOUNTER — Ambulatory Visit: Payer: Federal, State, Local not specified - PPO | Admitting: Internal Medicine

## 2023-07-16 ENCOUNTER — Inpatient Hospital Stay: Payer: Federal, State, Local not specified - PPO

## 2023-07-16 ENCOUNTER — Inpatient Hospital Stay: Payer: Federal, State, Local not specified - PPO | Attending: Internal Medicine | Admitting: Physician Assistant

## 2023-07-16 ENCOUNTER — Inpatient Hospital Stay: Payer: Federal, State, Local not specified - PPO | Attending: Internal Medicine

## 2023-07-16 ENCOUNTER — Ambulatory Visit: Payer: Federal, State, Local not specified - PPO

## 2023-07-16 VITALS — BP 144/66 | HR 76

## 2023-07-16 VITALS — BP 136/88 | HR 75 | Temp 97.9°F | Resp 18 | Wt 149.1 lb

## 2023-07-16 DIAGNOSIS — Z5112 Encounter for antineoplastic immunotherapy: Secondary | ICD-10-CM | POA: Insufficient documentation

## 2023-07-16 DIAGNOSIS — Z791 Long term (current) use of non-steroidal anti-inflammatories (NSAID): Secondary | ICD-10-CM | POA: Diagnosis not present

## 2023-07-16 DIAGNOSIS — C3411 Malignant neoplasm of upper lobe, right bronchus or lung: Secondary | ICD-10-CM | POA: Insufficient documentation

## 2023-07-16 DIAGNOSIS — R053 Chronic cough: Secondary | ICD-10-CM | POA: Insufficient documentation

## 2023-07-16 DIAGNOSIS — C7951 Secondary malignant neoplasm of bone: Secondary | ICD-10-CM | POA: Insufficient documentation

## 2023-07-16 DIAGNOSIS — Z95828 Presence of other vascular implants and grafts: Secondary | ICD-10-CM

## 2023-07-16 DIAGNOSIS — I700 Atherosclerosis of aorta: Secondary | ICD-10-CM | POA: Insufficient documentation

## 2023-07-16 DIAGNOSIS — Z79899 Other long term (current) drug therapy: Secondary | ICD-10-CM | POA: Insufficient documentation

## 2023-07-16 LAB — CBC WITH DIFFERENTIAL (CANCER CENTER ONLY)
Abs Immature Granulocytes: 0.03 10*3/uL (ref 0.00–0.07)
Basophils Absolute: 0.1 10*3/uL (ref 0.0–0.1)
Basophils Relative: 1 %
Eosinophils Absolute: 0.4 10*3/uL (ref 0.0–0.5)
Eosinophils Relative: 4 %
HCT: 40.4 % (ref 36.0–46.0)
Hemoglobin: 14.3 g/dL (ref 12.0–15.0)
Immature Granulocytes: 0 %
Lymphocytes Relative: 13 %
Lymphs Abs: 1.3 10*3/uL (ref 0.7–4.0)
MCH: 33.6 pg (ref 26.0–34.0)
MCHC: 35.4 g/dL (ref 30.0–36.0)
MCV: 94.8 fL (ref 80.0–100.0)
Monocytes Absolute: 0.8 10*3/uL (ref 0.1–1.0)
Monocytes Relative: 7 %
Neutro Abs: 7.9 10*3/uL — ABNORMAL HIGH (ref 1.7–7.7)
Neutrophils Relative %: 75 %
Platelet Count: 231 10*3/uL (ref 150–400)
RBC: 4.26 MIL/uL (ref 3.87–5.11)
RDW: 13 % (ref 11.5–15.5)
WBC Count: 10.4 10*3/uL (ref 4.0–10.5)
nRBC: 0 % (ref 0.0–0.2)

## 2023-07-16 LAB — CMP (CANCER CENTER ONLY)
ALT: 14 U/L (ref 0–44)
AST: 21 U/L (ref 15–41)
Albumin: 4 g/dL (ref 3.5–5.0)
Alkaline Phosphatase: 75 U/L (ref 38–126)
Anion gap: 7 (ref 5–15)
BUN: 22 mg/dL (ref 8–23)
CO2: 26 mmol/L (ref 22–32)
Calcium: 10.3 mg/dL (ref 8.9–10.3)
Chloride: 100 mmol/L (ref 98–111)
Creatinine: 0.79 mg/dL (ref 0.44–1.00)
GFR, Estimated: >60 mL/min (ref >60)
Glucose, Bld: 109 mg/dL — ABNORMAL HIGH (ref 70–99)
Potassium: 3.9 mmol/L (ref 3.5–5.1)
Sodium: 133 mmol/L — ABNORMAL LOW (ref 135–145)
Total Bilirubin: 0.4 mg/dL (ref 0.3–1.2)
Total Protein: 7.4 g/dL (ref 6.5–8.1)

## 2023-07-16 MED ORDER — SODIUM CHLORIDE 0.9% FLUSH
10.0000 mL | INTRAVENOUS | Status: DC | PRN
  Administered 2023-07-16: 10 mL

## 2023-07-16 MED ORDER — HEPARIN SOD (PORK) LOCK FLUSH 100 UNIT/ML IV SOLN
500.0000 [IU] | Freq: Once | INTRAVENOUS | Status: AC | PRN
  Administered 2023-07-16: 500 [IU]

## 2023-07-16 MED ORDER — SODIUM CHLORIDE 0.9 % IV SOLN
1500.0000 mg | Freq: Once | INTRAVENOUS | Status: AC
  Administered 2023-07-16: 1500 mg via INTRAVENOUS
  Filled 2023-07-16: qty 30

## 2023-07-16 MED ORDER — SODIUM CHLORIDE 0.9 % IV SOLN: Freq: Once | INTRAVENOUS | Status: AC

## 2023-07-16 NOTE — Patient Instructions (Signed)

## 2023-07-18 ENCOUNTER — Other Ambulatory Visit: Payer: Self-pay

## 2023-08-08 NOTE — Progress Notes (Signed)
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    PRIOR THERAPY: 1) Palliative radiotherapy to the left proximal humerus under the care of Dr. Mitzi Hansen. 2) Additional palliative radiotherapy to the proximal humerus under the care of Dr. Mitzi Hansen. Last treatment on 04/21/2020  CURRENT THERAPY: Palliative systemic chemotherapy with carboplatin for AUC of 5 on day 1, etoposide 100 mg/M2 on days 1, 2 and 3 as well as Imfinzi 1500 mg every 3 weeks with the chemotherapy and Neulasta support. First dose on 03/16/2019. Status post 58 cycles. Starting from cycle #5 the patient will be treated with maintenance treatment with Imfinzi 1500 mg IV every 4 weeks   INTERVAL HISTORY: Brittany Marks 82 y.o. female returns to the clinic today for a follow-up visit unaccompanied.  The patient is feeling fine today without any concerning complaints except for baseline fatigue and she did not sleep well last night. The patient lives alone and does not follow yo with any other providers. She has refused referral to primary care and pulmonary medicine in the past. I offered again today and she declined.   The patient denies any recent fever, chills, or night sweats. She typically has a chronic cough, mostly at night. She thinks she may be coughing more but not consistently.  He has not been using any cough medication as she states Mucinex typically takes care of it.  She typically coughs up white phlegm.  She has trouble using her inhaler due to decreased muscle strength in her hand.  She also lives alone.  She denies any chest pain or hemoptysis.  She has some underlying lung disease on her scans with  fibrosis.  Her cough is not consistent and it "varies".  She is still smoking 10 to 15 cigarettes/day  She denies any nausea or vomiting except she had 1 episode last week after eating a hot dog with associated diarrhea that resolved after using Imodium.  Denies any rashes or skin changes.  Denies any headache or visual changes.  She is here today for evaluation repeat blood work before undergoing cycle #59.     MEDICAL HISTORY: Past Medical History:  Diagnosis Date   Cataract    Bilateral   Constipation    pain medication   Left rotator cuff tear    Right rotator cuff tear    SCL CA dx'd 11/2018    ALLERGIES:  is allergic to oxycodone.  MEDICATIONS:  Current Outpatient Medications  Medication Sig Dispense Refill   albuterol (VENTOLIN HFA) 108 (90 Base) MCG/ACT inhaler Inhale 2 puffs into the lungs every 6 (six) hours as needed for wheezing or shortness of breath. 8 g 2   Ascorbic Acid (VITAMIN C PO) Take by mouth daily.      b complex vitamins tablet Take 1 tablet by mouth daily.     COD LIVER OIL PO Take 1 tablet by mouth daily.      gabapentin (NEURONTIN) 100 MG capsule Take 1 capsule (100 mg total) by mouth 2 (two) times daily. 90 capsule 2   hydrocortisone 2.5 % cream Apply topically as needed. (Patient taking differently: Apply 1 application  topically daily as needed (Treatment).) 3.5 g 0  ibuprofen (ADVIL) 200 MG tablet Take 600 mg by mouth daily.     lidocaine-prilocaine (EMLA) cream Apply 1 Application topically every 30 (thirty) days. 30 g 2   Multiple Vitamin (MULTIVITAMIN) tablet Take 1 tablet by mouth daily.     ondansetron (ZOFRAN) 4 MG tablet Take 1 tablet (4 mg total) by mouth every 8 (eight) hours as needed for nausea or vomiting. 10 tablet 0   oxymetazoline (AFRIN) 0.05 % nasal spray Place 2 sprays into the nose 2 (two) times daily as needed (Bloody nose).     prochlorperazine (COMPAZINE) 10 MG tablet Take 1 tablet (10 mg total) by mouth every 6 (six) hours as  needed for nausea or vomiting. 30 tablet 0   VITAMIN E PO Take 1 tablet by mouth daily.      No current facility-administered medications for this visit.   Facility-Administered Medications Ordered in Other Visits  Medication Dose Route Frequency Provider Last Rate Last Admin   heparin lock flush 100 unit/mL  500 Units Intracatheter Once PRN Magrinat, Valentino Hue, MD       sodium chloride flush (NS) 0.9 % injection 10 mL  10 mL Intracatheter PRN Magrinat, Valentino Hue, MD        SURGICAL HISTORY:  Past Surgical History:  Procedure Laterality Date   CATARACT EXTRACTION W/ INTRAOCULAR LENS  IMPLANT, BILATERAL  2012   DECOMPRESSIVE LUMBAR LAMINECTOMY LEVEL 2 N/A 07/29/2013   Procedure: LUMBAR LAMINECTOMY, DECOMPRESSION LUMBAR THREE TO FOUR, FOUR TO FIVE microdiscectomy l3,4 right;  Surgeon: Jacki Cones, MD;  Location: [facility];  Service: Orthopedics;  Laterality: N/A;   I & D SUPERIOR RIGHT SHOULDER AND CLOSURE WOUND  01-10-2011   S/P ROTATOR CUFF REPAIR   IR IMAGING GUIDED PORT INSERTION  04/09/2019   LUMBAR LAMINECTOMY  1970'S   LUMBAR LAMINECTOMY/DECOMPRESSION MICRODISCECTOMY N/A 06/27/2016   Procedure: L1 - L2 DISCECTOMY;  Surgeon: Venita Lick, MD;  Location: [facility];  Service: Orthopedics;  Laterality: N/A;   LUMBAR SPINE SURGERY  1983   REVERSE SHOULDER ARTHROPLASTY Left 03/10/2020   Procedure: REVERSE SHOULDER ARTHROPLASTY;  Surgeon: Francena Hanly, MD;  Location: [facility];  Service: Orthopedics;  Laterality: Left;    RIGHT SHOULDER ARTHROSCOPY/ OPEN DISTAL CLAVICLE RESECTION/ SAD/ OPEN ROTATOR CUFF REPAIR  11-28-2010   SHOULDER FUSION SURGERY  03/11/2020   Shoulder Surgery , hardware placed   SHOULDER OPEN ROTATOR CUFF REPAIR  12/19/2011   Procedure: ROTATOR CUFF REPAIR SHOULDER OPEN;  Surgeon: Drucilla Schmidt, MD;  Location: [facility];  Service: Orthopedics;  Laterality: Right;  RIGHT RECURRENT OPEN REPAIR OF THE ROTATOR CUFF WITH TISSUE MEND GRAFTANTERIOR  CHROMIOECTOMY   VAGINAL HYSTERECTOMY  1979    REVIEW OF SYSTEMS:   Constitutional: Positive for fatigue. Negative for appetite change, chills, fever and unexpected weight change.  HENT: Negative for mouth sores, nosebleeds, sore throat and trouble swallowing.  Eyes: Negative for eye problems and icterus.  Respiratory: Positive for dyspnea on exertion and cough (mostly at night). Negative for hemoptysis and wheezing.   Cardiovascular: Negative for chest pain and leg swelling.  Gastrointestinal: Negative for abdominal pain, diarrhea (resolved), constipation, diarrhea, nausea and vomiting (resolved).  Genitourinary: Negative for bladder incontinence, difficulty urinating, dysuria, frequency and hematuria.   Musculoskeletal: Positive for chronic back pain. Positive for knee pain. Negative for gait problem, neck pain and neck stiffness.  Skin: Negative for itching and rash.  Neurological: Negative for dizziness, extremity weakness, gait problem, headaches, light-headedness and seizures.  Hematological:  Negative for adenopathy. Does not bruise/bleed easily.  Psychiatric/Behavioral: Positive for trouble sleeping the day before infusions. Negative for confusion, depression and sleep disturbance. The patient is not nervous/anxious    PHYSICAL EXAMINATION:  There were no vitals taken for this visit.  ECOG PERFORMANCE STATUS: 2-3  Physical Exam  Constitutional: Oriented to person, place, and time and chronically ill appearing female and in no distress.  HENT:  Head: Normocephalic and atraumatic.  Mouth/Throat: Oropharynx is clear and moist. No oropharyngeal exudate.  Eyes: Conjunctivae are normal. Right eye exhibits no discharge. Left eye exhibits no discharge. No scleral icterus.  Neck: Normal range of motion. Neck supple.  Cardiovascular: Normal rate, regular rhythm, normal heart sounds.   Pulmonary/Chest: Effort normal. Crackles noted at lung bases bilaterally (baseline). No respiratory  distress.  Abdominal: Soft. Exhibits no distension and no mass. There is no tenderness.  Musculoskeletal: Normal range of motion. Exhibits no edema.  Lymphadenopathy:    No cervical adenopathy.  Neurological: Alert and oriented to person, place, and time. Exhibits muscle wasting. Examined in the wheelchair.  Skin: Skin is warm and dry. No rash noted. Not diaphoretic. No erythema. No pallor.  Psychiatric: Mood, memory and judgment normal.  Vitals reviewed.  LABORATORY DATA: Lab Results  Component Value Date   WBC 10.4 07/16/2023   HGB 14.3 07/16/2023   HCT 40.4 07/16/2023   MCV 94.8 07/16/2023   PLT 231 07/16/2023      Chemistry      Component Value Date/Time   NA 133 (L) 07/16/2023 1240   K 3.9 07/16/2023 1240   CL 100 07/16/2023 1240   CO2 26 07/16/2023 1240   BUN 22 07/16/2023 1240   CREATININE 0.79 07/16/2023 1240      Component Value Date/Time   CALCIUM 10.3 07/16/2023 1240   ALKPHOS 75 07/16/2023 1240   AST 21 07/16/2023 1240   ALT 14 07/16/2023 1240   BILITOT 0.4 07/16/2023 1240       RADIOGRAPHIC STUDIES:  CT CHEST ABDOMEN PELVIS W CONTRAST  Result Date: 07/16/2023 CLINICAL DATA:  Restaging of small cell lung cancer. Ongoing immunotherapy. * Tracking Code: BO * EXAM: CT CHEST, ABDOMEN, AND PELVIS WITH CONTRAST TECHNIQUE: Multidetector CT imaging of the chest, abdomen and pelvis was performed following the standard protocol during bolus administration of intravenous contrast. RADIATION DOSE REDUCTION: This exam was performed according to the departmental dose-optimization program which includes automated exposure control, adjustment of the mA and/or kV according to patient size and/or use of iterative reconstruction technique. CONTRAST:  OMNIPAQUE IOHEXOL 300 MG/ML  SOLN COMPARISON:  Multiple exams, including 03/22/2023 FINDINGS: CT CHEST FINDINGS Cardiovascular: Coronary, aortic arch, and branch vessel atherosclerotic vascular disease. Mild cardiomegaly.  Mitral and aortic valve calcifications. Right IJ Port-A-Cath tip: Right atrium. Mediastinum/Nodes: Index right paratracheal node 0.7 cm in short axis on image 22 series 2, formerly measured at 1.0 cm. Prevascular lymph node 0.7 cm in short axis on image 24 series 2, previously measured at 0.9 cm. Overall appearance of mediastinal lymph nodes is modestly improved. Lungs/Pleura: Biapical pleuroparenchymal scarring. Peripheral fibrosis. Some peripheral honeycombing. Cylindrical bronchiectasis in the lower lobes and right middle lobe. Stable pattern of peripheral subpleural density in the left upper lobe presumably from radiation therapy, up to 2.0 cm in thickness, previously measured at 2.1 cm in thickness. A somewhat nodular inferior margin of this process measures 0.9 by 1.2 cm on image 47 of series 4, previously 0.8 by 1.0 cm, attention to this vicinity suggested given the slight  increase in prominence of the small component. Musculoskeletal: Verse left shoulder arthroplasty with ill definition and possible destructive findings along the glenoid and glenoid neck, with lucency and potential expansion in this region on image 21 series 4 increased from previous. Some of this appearance could be from old fracture. Markedly severe arthropathy of the right glenohumeral joint with resorption of much of the right humeral head with concave expansion and and remodeling of the right glenoid. Right distal clavicular resection versus remote osteolysis. Thoracic spondylosis and kyphosis. CT ABDOMEN PELVIS FINDINGS Hepatobiliary: Unremarkable Pancreas: Unremarkable Spleen: Unremarkable Adrenals/Urinary Tract: Both adrenal glands appear normal. Continued marked expansion of the right renal collecting system associated mild to moderate right hydronephrosis. No hydroureter, query chronic UPJ stenosis. There is excretion of contrast from the right kidney into the capacious right collecting system as on prior exams. No focal lesion of  the renal parenchyma is identified. Urinary bladder unremarkable. Stomach/Bowel: Small type 1 hiatal hernia. Vascular/Lymphatic: Extensive systemic atherosclerosis is present, including aortoiliac atherosclerotic disease. Dense atheromatous vascular calcifications proximally in the celiac trunk and SMA without overt occlusion identified. Segmental occlusion of the right common iliac artery with reconstitution distally. Reproductive: Uterus absent.  Adnexa unremarkable. Other: No supplemental non-categorized findings. Musculoskeletal: Lumbar spondylosis and degenerative disc disease with grade 1 retrolisthesis at L1-2 and L2-3, and suspected posterior decompression and right facetectomy L1-2. Posterior decompression at L4. Small umbilical hernia contains adipose tissue. Prominent chondral thinning in the hip joints. IMPRESSION: 1. Stable pattern of peripheral subpleural density in the left upper lobe presumably from radiation therapy, up to 2.0 cm in thickness, previously measured at 2.1 cm in thickness. A somewhat nodular inferior margin of this process measures 0.9 by 1.2 cm, previously 0.8 by 1.0 cm, attention to this vicinity suggested on follow up imaging given the slight increase in prominence of the small component. 2. Mildly reduced size of mediastinal lymph nodes, currently within normal limits. 3. Continued marked expansion of the right renal collecting system associated mild to moderate right hydronephrosis. No hydroureter, query chronic UPJ stenosis. 4. Extensive systemic atherosclerosis, including coronary, aortic arch, and branch vessel atherosclerotic vascular disease. 5. Mild cardiomegaly. Mitral and aortic valve calcifications. 6. Small type 1 hiatal hernia. 7. Bony ill definition of the left glenoid along the screw attachments from the reverse shoulder arthroplasty, cannot exclude osteolysis. Severe right glenohumeral arthropathy including chronic volume loss in the right humeral head and scalloped  expanded appearance of the right glenoid. 8. Lumbar spondylosis and degenerative disc disease. Aortic Atherosclerosis (ICD10-I70.0). Electronically Signed   By: Gaylyn Rong M.D.   On: 07/16/2023 09:06     ASSESSMENT/PLAN:  This is a very pleasant 82 year old Caucasian female diagnosed with extensive stage (T1b, N3, M1 C) small cell lung cancer.  She presented with right upper lobe pulmonary nodules in addition to bilateral hilar and right mediastinal as well as left supraclavicular and abdominal lymphadenopathy.  She also has metastatic disease to the in the left humerus.  She was diagnosed in May 2020.   She is status post palliative radiotherapy to the left proximal humerus under the care of Dr. Mitzi Hansen in 2020.   She is currently undergoing palliative systemic chemotherapy with carboplatin for AUC of 5 on day 1, etoposide 100 mg/M2 on days 1, 2 and 3 as well as Imfinzi 1500 mg every 3 weeks with the chemotherapy and Neulasta support. Status post 58 cycles. Starting from cycle #5 the patient has been treated with maintenance treatment with Imfinzi 1500 mg IV  every 4 weeks.   She had a pathological fracture in the left humerus underwent additional radiation to this area. Last treatment was 04/21/20.   I had a lengthy discussion with the patient today about the importance of following with a family provider and other specialists if needed.  I had this discussion with the patient previously.  I would strongly encouraged her to have a PCP to manage all her other medical comorbidities and for referral to pulmonary medicine due to her pulmonary fibrosis and management of her underlying lung disease to optimize her breathing.  She initially reported that she feels like she is coughing more frequently but denies needing a cough suppressant or any symptoms of infection such as fever, sore throat, nasal congestion, etc.  I still recommended obtaining a chest x-ray today to ensure no underlying infection and  possible antibiotics if needed but the patient declined and states that her cough is not consistent and that "today is not a good day" and she is fatigued from not sleeping well and wants to go home.  I did caution the patient should she have new or worsening symptoms that I would highly recommend that she call back for reevaluation. I reviewed her symptoms with Dr. Arbutus Ped.   Labs were reviewed. Recommend that she proceed with cycle #59 today as scheduled.    We will see her back for follow-up visit in 4 weeks for evaluation before starting cycle #60.   The patient is interested in the flu shot.  She will receive a flu shot while in the infusion room today  The patient was advised to call immediately if she has any concerning symptoms in the interval. The patient voices understanding of current disease status and treatment options and is in agreement with the current care plan. All questions were answered. The patient knows to call the clinic with any problems, questions or concerns. We can certainly see the patient much sooner if necessary    No orders of the defined types were placed in this encounter.    The total time spent in the appointment was 30-39 minutes  Vetra Shinall L Amor Hyle, PA-C 08/08/23

## 2023-08-14 ENCOUNTER — Inpatient Hospital Stay: Payer: Federal, State, Local not specified - PPO

## 2023-08-14 ENCOUNTER — Inpatient Hospital Stay: Payer: Federal, State, Local not specified - PPO | Attending: Internal Medicine

## 2023-08-14 ENCOUNTER — Inpatient Hospital Stay (HOSPITAL_BASED_OUTPATIENT_CLINIC_OR_DEPARTMENT_OTHER): Payer: Federal, State, Local not specified - PPO | Admitting: Physician Assistant

## 2023-08-14 VITALS — BP 129/95 | HR 87 | Temp 98.3°F | Resp 18 | Ht 62.0 in | Wt 152.3 lb

## 2023-08-14 DIAGNOSIS — K429 Umbilical hernia without obstruction or gangrene: Secondary | ICD-10-CM | POA: Insufficient documentation

## 2023-08-14 DIAGNOSIS — Z79899 Other long term (current) drug therapy: Secondary | ICD-10-CM | POA: Insufficient documentation

## 2023-08-14 DIAGNOSIS — J984 Other disorders of lung: Secondary | ICD-10-CM | POA: Diagnosis not present

## 2023-08-14 DIAGNOSIS — M51369 Other intervertebral disc degeneration, lumbar region without mention of lumbar back pain or lower extremity pain: Secondary | ICD-10-CM | POA: Insufficient documentation

## 2023-08-14 DIAGNOSIS — N1330 Unspecified hydronephrosis: Secondary | ICD-10-CM | POA: Insufficient documentation

## 2023-08-14 DIAGNOSIS — C7951 Secondary malignant neoplasm of bone: Secondary | ICD-10-CM | POA: Diagnosis not present

## 2023-08-14 DIAGNOSIS — K5900 Constipation, unspecified: Secondary | ICD-10-CM | POA: Insufficient documentation

## 2023-08-14 DIAGNOSIS — Z5112 Encounter for antineoplastic immunotherapy: Secondary | ICD-10-CM | POA: Insufficient documentation

## 2023-08-14 DIAGNOSIS — K449 Diaphragmatic hernia without obstruction or gangrene: Secondary | ICD-10-CM | POA: Insufficient documentation

## 2023-08-14 DIAGNOSIS — I3481 Nonrheumatic mitral (valve) annulus calcification: Secondary | ICD-10-CM | POA: Diagnosis not present

## 2023-08-14 DIAGNOSIS — M47816 Spondylosis without myelopathy or radiculopathy, lumbar region: Secondary | ICD-10-CM | POA: Insufficient documentation

## 2023-08-14 DIAGNOSIS — Z791 Long term (current) use of non-steroidal anti-inflammatories (NSAID): Secondary | ICD-10-CM | POA: Diagnosis not present

## 2023-08-14 DIAGNOSIS — Z9221 Personal history of antineoplastic chemotherapy: Secondary | ICD-10-CM | POA: Diagnosis not present

## 2023-08-14 DIAGNOSIS — Z23 Encounter for immunization: Secondary | ICD-10-CM | POA: Insufficient documentation

## 2023-08-14 DIAGNOSIS — C3411 Malignant neoplasm of upper lobe, right bronchus or lung: Secondary | ICD-10-CM | POA: Insufficient documentation

## 2023-08-14 DIAGNOSIS — I700 Atherosclerosis of aorta: Secondary | ICD-10-CM | POA: Insufficient documentation

## 2023-08-14 DIAGNOSIS — R590 Localized enlarged lymph nodes: Secondary | ICD-10-CM | POA: Insufficient documentation

## 2023-08-14 DIAGNOSIS — Z95828 Presence of other vascular implants and grafts: Secondary | ICD-10-CM

## 2023-08-14 DIAGNOSIS — Z923 Personal history of irradiation: Secondary | ICD-10-CM | POA: Insufficient documentation

## 2023-08-14 LAB — CBC WITH DIFFERENTIAL (CANCER CENTER ONLY)
Abs Immature Granulocytes: 0.04 10*3/uL (ref 0.00–0.07)
Basophils Absolute: 0 10*3/uL (ref 0.0–0.1)
Basophils Relative: 0 %
Eosinophils Absolute: 0.2 10*3/uL (ref 0.0–0.5)
Eosinophils Relative: 2 %
HCT: 39.2 % (ref 36.0–46.0)
Hemoglobin: 13.3 g/dL (ref 12.0–15.0)
Immature Granulocytes: 0 %
Lymphocytes Relative: 10 %
Lymphs Abs: 1.1 10*3/uL (ref 0.7–4.0)
MCH: 32.5 pg (ref 26.0–34.0)
MCHC: 33.9 g/dL (ref 30.0–36.0)
MCV: 95.8 fL (ref 80.0–100.0)
Monocytes Absolute: 0.7 10*3/uL (ref 0.1–1.0)
Monocytes Relative: 7 %
Neutro Abs: 8.6 10*3/uL — ABNORMAL HIGH (ref 1.7–7.7)
Neutrophils Relative %: 81 %
Platelet Count: 238 10*3/uL (ref 150–400)
RBC: 4.09 MIL/uL (ref 3.87–5.11)
RDW: 13.7 % (ref 11.5–15.5)
WBC Count: 10.6 10*3/uL — ABNORMAL HIGH (ref 4.0–10.5)
nRBC: 0 % (ref 0.0–0.2)

## 2023-08-14 LAB — CMP (CANCER CENTER ONLY)
ALT: 35 U/L (ref 0–44)
AST: 33 U/L (ref 15–41)
Albumin: 3.8 g/dL (ref 3.5–5.0)
Alkaline Phosphatase: 89 U/L (ref 38–126)
Anion gap: 5 (ref 5–15)
BUN: 18 mg/dL (ref 8–23)
CO2: 28 mmol/L (ref 22–32)
Calcium: 9.1 mg/dL (ref 8.9–10.3)
Chloride: 97 mmol/L — ABNORMAL LOW (ref 98–111)
Creatinine: 0.63 mg/dL (ref 0.44–1.00)
GFR, Estimated: >60 mL/min (ref >60)
Glucose, Bld: 140 mg/dL — ABNORMAL HIGH (ref 70–99)
Potassium: 3.9 mmol/L (ref 3.5–5.1)
Sodium: 130 mmol/L — ABNORMAL LOW (ref 135–145)
Total Bilirubin: 0.6 mg/dL (ref <1.2)
Total Protein: 7.1 g/dL (ref 6.5–8.1)

## 2023-08-14 MED ORDER — SODIUM CHLORIDE 0.9 % IV SOLN
1500.0000 mg | Freq: Once | INTRAVENOUS | Status: AC
  Administered 2023-08-14: 1500 mg via INTRAVENOUS
  Filled 2023-08-14: qty 30

## 2023-08-14 MED ORDER — SODIUM CHLORIDE 0.9 % IV SOLN: Freq: Once | INTRAVENOUS | Status: AC

## 2023-08-14 MED ORDER — HEPARIN SOD (PORK) LOCK FLUSH 100 UNIT/ML IV SOLN
500.0000 [IU] | Freq: Once | INTRAVENOUS | Status: AC | PRN
Start: 2023-08-14 — End: 2023-08-14
  Administered 2023-08-14: 500 [IU]

## 2023-08-14 MED ORDER — INFLUENZA VAC A&B SURF ANT ADJ 0.5 ML IM SUSY
0.5000 mL | PREFILLED_SYRINGE | Freq: Once | INTRAMUSCULAR | Status: AC
  Administered 2023-08-14: 0.5 mL via INTRAMUSCULAR
  Filled 2023-08-14: qty 0.5

## 2023-08-14 MED ORDER — SODIUM CHLORIDE 0.9% FLUSH
10.0000 mL | INTRAVENOUS | Status: DC | PRN
  Administered 2023-08-14: 10 mL via INTRAVENOUS

## 2023-08-14 NOTE — Patient Instructions (Signed)
[facility] - A DEPT OF [facility]  Discharge Instructions: Thank you for choosing [facility] to provide your oncology and hematology care.   If you have a lab appointment with the Cancer Center, please go directly to the Cancer Center and check in at the registration area.   Wear comfortable clothing and clothing appropriate for easy access to any Portacath or PICC line.   We strive to give you quality time with your provider. You may need to reschedule your appointment if you arrive late (15 or more minutes).  Arriving late affects you and other patients whose appointments are after yours.  Also, if you miss three or more appointments without notifying the office, you may be dismissed from the clinic at the provider's discretion.      For prescription refill requests, have your pharmacy contact our office and allow 72 hours for refills to be completed.    Today you received the following chemotherapy and/or immunotherapy agents: Durvalumab.       To help prevent nausea and vomiting after your treatment, we encourage you to take your nausea medication as directed.  BELOW ARE SYMPTOMS THAT SHOULD BE REPORTED IMMEDIATELY: *FEVER GREATER THAN 100.4 F (38 C) OR HIGHER *CHILLS OR SWEATING *NAUSEA AND VOMITING THAT IS NOT CONTROLLED WITH YOUR NAUSEA MEDICATION *UNUSUAL SHORTNESS OF BREATH *UNUSUAL BRUISING OR BLEEDING *URINARY PROBLEMS (pain or burning when urinating, or frequent urination) *BOWEL PROBLEMS (unusual diarrhea, constipation, pain near the anus) TENDERNESS IN MOUTH AND THROAT WITH OR WITHOUT PRESENCE OF ULCERS (sore throat, sores in mouth, or a toothache) UNUSUAL RASH, SWELLING OR PAIN  UNUSUAL VAGINAL DISCHARGE OR ITCHING   Items with * indicate a potential emergency and should be followed up as soon as possible or go to the Emergency Department if any problems should occur.  Please show the CHEMOTHERAPY ALERT CARD or  IMMUNOTHERAPY ALERT CARD at check-in to the Emergency Department and triage nurse.  Should you have questions after your visit or need to cancel or reschedule your appointment, please contact [facility] - A DEPT OF [facility]  Dept: [phone]  and follow the prompts.  Office hours are 8:00 a.m. to 4:30 p.m. Monday - Friday. Please note that voicemails left after 4:00 p.m. may not be returned until the following business day.  We are closed weekends and major holidays. You have access to a nurse at all times for urgent questions. Please call the main number to the clinic Dept: [phone] and follow the prompts.   For any non-urgent questions, you may also contact your provider using MyChart. We now offer e-Visits for anyone 9 and older to request care online for non-urgent symptoms. For details visit [email or web address].   Also download the MyChart app! Go to the app store, search "MyChart", open the app, select [facility], and log in with your MyChart username and password.

## 2023-08-16 ENCOUNTER — Other Ambulatory Visit: Payer: Self-pay

## 2023-08-18 ENCOUNTER — Other Ambulatory Visit: Payer: Self-pay

## 2023-08-29 ENCOUNTER — Encounter: Payer: Self-pay | Admitting: Oncology

## 2023-08-29 ENCOUNTER — Encounter: Payer: Self-pay | Admitting: Internal Medicine

## 2023-08-29 NOTE — Telephone Encounter (Signed)
Telephone call  

## 2023-09-01 ENCOUNTER — Other Ambulatory Visit: Payer: Self-pay

## 2023-09-02 ENCOUNTER — Encounter: Payer: Self-pay | Admitting: Internal Medicine

## 2023-09-02 NOTE — Progress Notes (Signed)
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    PRIOR THERAPY: 1) Palliative radiotherapy to the left proximal humerus under the care of Dr. Mitzi Hansen. 2) Additional palliative radiotherapy to the proximal humerus under the care of Dr. Mitzi Hansen. Last treatment on 04/21/2020  CURRENT THERAPY:  Palliative systemic chemotherapy with carboplatin for AUC of 5 on day 1, etoposide 100 mg/M2 on days 1, 2 and 3 as well as Imfinzi 1500 mg every 3 weeks with the chemotherapy and Neulasta support. First dose on 03/16/2019. Status post 59 cycles. Starting from cycle #5 the patient will be treated with maintenance treatment with Imfinzi 1500 mg IV every 4 weeks   INTERVAL HISTORY: Brittany Marks 82 y.o. female returns to the clinic today for a follow-up visit unaccompanied.  The patient is feeling fine today without any concerning complaints except for baseline fatigue and intermittent shortness of breath. This patient is very well known to me. I have strongly encouraged the patient several times in the past to establish care with a family doctor. I also have strongly recommended to establish care with a pulmonologist due to her scans showing pulmonary fibrosis and emphysema. Her oxygen tends to run in the low 90's when she is at rest in the clinic, however, she is typically not ambulatory in the clinic so unclear what her oxygen status is with exertion. She has never had oxygen at home. She always declines referral to pulmonary medicine. I offered referral to social work if she has financial concerns but she declines. She does not use her rescue inhaler at home due to not having grip strength.  She lives alone.   Regarding the shortness of breath, she states that it has slowly and gradually worsened over several months. Her shortness of breath is not persistent. She notices her breathing is worse with the cold weather but seems to be better if indoors. She still smokes about 10-15 cigarettes per day. She may have intermittent cough. Denies chest pain or hemoptysis. Denies recent signs of infection such as sore throat, nasal congestions, or fever.   She denies any nausea or vomiting. Denies any rashes or skin changes. Denies any headache or visual changes. She denies diarrhea or constipation. She is here today for evaluation repeat blood work before undergoing cycle #60   MEDICAL HISTORY: Past Medical History:  Diagnosis Date   Cataract    Bilateral   Constipation    pain medication   Left rotator cuff tear    Right rotator cuff tear    SCL CA dx'd 11/2018    ALLERGIES:  is allergic to oxycodone.  MEDICATIONS:  Current Outpatient Medications  Medication Sig Dispense Refill   albuterol (VENTOLIN HFA) 108 (90 Base) MCG/ACT inhaler Inhale 2 puffs into the lungs every 6 (six) hours as needed for wheezing or shortness of breath. 8 g 2   Ascorbic Acid (VITAMIN C PO) Take by mouth daily.      b complex vitamins tablet Take 1 tablet by mouth daily.     COD LIVER OIL PO Take 1 tablet by mouth daily.      gabapentin (NEURONTIN) 100 MG capsule Take 1 capsule (100 mg total) by mouth 2 (  two) times daily. 90 capsule 2   hydrocortisone 2.5 % cream Apply topically as needed. (Patient taking differently: Apply 1 application  topically daily as needed (Treatment).) 3.5 g 0   ibuprofen (ADVIL) 200 MG tablet Take 600 mg by mouth daily.     lidocaine-prilocaine (EMLA) cream Apply 1 Application topically every 30 (thirty) days. 30 g 2   Multiple Vitamin (MULTIVITAMIN) tablet Take 1 tablet by mouth daily.     ondansetron (ZOFRAN) 4 MG tablet Take 1 tablet (4 mg total) by mouth every 8 (eight) hours  as needed for nausea or vomiting. 10 tablet 0   oxymetazoline (AFRIN) 0.05 % nasal spray Place 2 sprays into the nose 2 (two) times daily as needed (Bloody nose).     prochlorperazine (COMPAZINE) 10 MG tablet Take 1 tablet (10 mg total) by mouth every 6 (six) hours as needed for nausea or vomiting. 30 tablet 0   VITAMIN E PO Take 1 tablet by mouth daily.      No current facility-administered medications for this visit.   Facility-Administered Medications Ordered in Other Visits  Medication Dose Route Frequency Provider Last Rate Last Admin   heparin lock flush 100 unit/mL  500 Units Intracatheter Once PRN Magrinat, Valentino Hue, MD       sodium chloride flush (NS) 0.9 % injection 10 mL  10 mL Intracatheter PRN Magrinat, Valentino Hue, MD        SURGICAL HISTORY:  Past Surgical History:  Procedure Laterality Date   CATARACT EXTRACTION W/ INTRAOCULAR LENS  IMPLANT, BILATERAL  2012   DECOMPRESSIVE LUMBAR LAMINECTOMY LEVEL 2 N/A 07/29/2013   Procedure: LUMBAR LAMINECTOMY, DECOMPRESSION LUMBAR THREE TO FOUR, FOUR TO FIVE microdiscectomy l3,4 right;  Surgeon: Jacki Cones, MD;  Location: [facility];  Service: Orthopedics;  Laterality: N/A;   I & D SUPERIOR RIGHT SHOULDER AND CLOSURE WOUND  01-10-2011   S/P ROTATOR CUFF REPAIR   IR IMAGING GUIDED PORT INSERTION  04/09/2019   LUMBAR LAMINECTOMY  1970'S   LUMBAR LAMINECTOMY/DECOMPRESSION MICRODISCECTOMY N/A 06/27/2016   Procedure: L1 - L2 DISCECTOMY;  Surgeon: Venita Lick, MD;  Location: [facility];  Service: Orthopedics;  Laterality: N/A;   LUMBAR SPINE SURGERY  1983   REVERSE SHOULDER ARTHROPLASTY Left 03/10/2020   Procedure: REVERSE SHOULDER ARTHROPLASTY;  Surgeon: Francena Hanly, MD;  Location: [facility];  Service: Orthopedics;  Laterality: Left;    RIGHT SHOULDER ARTHROSCOPY/ OPEN DISTAL CLAVICLE RESECTION/ SAD/ OPEN ROTATOR CUFF REPAIR  11-28-2010   SHOULDER FUSION SURGERY  03/11/2020   Shoulder Surgery , hardware placed   SHOULDER OPEN ROTATOR CUFF  REPAIR  12/19/2011   Procedure: ROTATOR CUFF REPAIR SHOULDER OPEN;  Surgeon: Drucilla Schmidt, MD;  Location: [facility];  Service: Orthopedics;  Laterality: Right;  RIGHT RECURRENT OPEN REPAIR OF THE ROTATOR CUFF WITH TISSUE MEND GRAFTANTERIOR CHROMIOECTOMY   VAGINAL HYSTERECTOMY  1979    REVIEW OF SYSTEMS:   Constitutional: Positive for fatigue. Negative for appetite change, chills, fever and unexpected weight change.  HENT: Negative for mouth sores, nosebleeds, sore throat and trouble swallowing.  Eyes: Negative for eye problems and icterus.  Respiratory: Positive for intermittent dyspnea on exertion that has gradually worsened over the last few months and intermittent cough. Negative for hemoptysis and wheezing.   Cardiovascular: Negative for chest pain and leg swelling.  Gastrointestinal: Negative for abdominal pain, diarrhea, constipation, diarrhea, nausea and vomiting. Genitourinary: Negative for bladder incontinence, difficulty urinating, dysuria, frequency and hematuria.   Musculoskeletal: Positive for  chronic back pain. Positive for knee pain. Negative for gait problem, neck pain and neck stiffness.  Skin: Negative for itching and rash.  Neurological: Negative for dizziness, extremity weakness, gait problem, headaches, light-headedness and seizures.  Hematological: Negative for adenopathy. Does not bruise/bleed easily.  Psychiatric/Behavioral: Positive for trouble sleeping the day before infusions. Negative for confusion, depression and sleep disturbance. The patient is not nervous/anxious    PHYSICAL EXAMINATION:  There were no vitals taken for this visit.  ECOG PERFORMANCE STATUS: 2-3  Physical Exam  Constitutional: Oriented to person, place, and time and chronically ill appearing female and in no distress.  HENT:  Head: Normocephalic and atraumatic.  Mouth/Throat: Oropharynx is clear and moist. No oropharyngeal exudate.  Eyes: Conjunctivae are normal. Right  eye exhibits no discharge. Left eye exhibits no discharge. No scleral icterus.  Neck: Normal range of motion. Neck supple.  Cardiovascular: Normal rate, regular rhythm, normal heart sounds.   Pulmonary/Chest: Effort normal. Crackles noted at lung bases bilaterally (baseline). No respiratory distress.  Abdominal: Soft. Exhibits no distension and no mass. There is no tenderness.  Musculoskeletal: Normal range of motion. Exhibits no edema.  Lymphadenopathy:    No cervical adenopathy.  Neurological: Alert and oriented to person, place, and time. Exhibits muscle wasting. Examined in the wheelchair.  Skin: Skin is warm and dry. No rash noted. Not diaphoretic. No erythema. No pallor.  Psychiatric: Mood, memory and judgment normal.  Vitals reviewed.  LABORATORY DATA: Lab Results  Component Value Date   WBC 10.6 (H) 08/14/2023   HGB 13.3 08/14/2023   HCT 39.2 08/14/2023   MCV 95.8 08/14/2023   PLT 238 08/14/2023      Chemistry      Component Value Date/Time   NA 130 (L) 08/14/2023 1403   K 3.9 08/14/2023 1403   CL 97 (L) 08/14/2023 1403   CO2 28 08/14/2023 1403   BUN 18 08/14/2023 1403   CREATININE 0.63 08/14/2023 1403      Component Value Date/Time   CALCIUM 9.1 08/14/2023 1403   ALKPHOS 89 08/14/2023 1403   AST 33 08/14/2023 1403   ALT 35 08/14/2023 1403   BILITOT 0.6 08/14/2023 1403       RADIOGRAPHIC STUDIES:  No results found.   ASSESSMENT/PLAN:  This is a very pleasant 82 year old Caucasian female diagnosed with extensive stage (T1b, N3, M1 C) small cell lung cancer.  She presented with right upper lobe pulmonary nodules in addition to bilateral hilar and right mediastinal as well as left supraclavicular and abdominal lymphadenopathy.  She also has metastatic disease to the in the left humerus.  She was diagnosed in May 2020.   She is status post palliative radiotherapy to the left proximal humerus under the care of Dr. Mitzi Hansen in 2020.   She is currently undergoing  palliative systemic chemotherapy with carboplatin for AUC of 5 on day 1, etoposide 100 mg/M2 on days 1, 2 and 3 as well as Imfinzi 1500 mg every 3 weeks with the chemotherapy and Neulasta support. Status post 59 cycles. Starting from cycle #5 the patient has been treated with maintenance treatment with Imfinzi 1500 mg IV every 4 weeks.   She had a pathological fracture in the left humerus underwent additional radiation to this area. Last treatment was 04/21/20.   Labs were reviewed. Recommend she proceed with cycle #60 today as scheduled.   We will see her back for follow-up visit in 4 weeks for evaluation before starting cycle #61.   The patient's oxygen tends  to be in the low 90s at rest while in the clinic.  She has not had this checked while ambulating.  Given her significant underlying lung disease, I would anticipate that her oxygen drops when she ambulates. We performed a walking test today and she does qualify for oxygen with exertion.  I once again strongly recommended that the patient establish care with pulmonary medicine.  She declined.  I let the patient know that I am going to enter the referral into strongly reconsider when they call her for an appointment.  Regarding her shortness of breath, we discussed the options today.  She can have a chest x-ray performed today to assess her shortness of breath, although the patient understands there is some limitations to a chest x-ray.  The other option is to arrange for her restaging CT scan to be performed in the next week which will be able to assess her lungs better but requires prior auth prior to scheduling.  Since the patient's breathing is not acutely worsened we will arrange for CT scan of the chest, abdomen, and pelvis to check her response to treatment and any other causes of her gradually worsening shortness of breath.  I will call the patient to review the results and arrange for follow-up sooner if needed based on the scan results.  However,r the patient understands she will need to be evaluated sooner if she has acutely worsening shortness of breath.   I offered a referral to social work which she declined.  The patient was advised to call immediately if she has any concerning symptoms in the interval. The patient voices understanding of current disease status and treatment options and is in agreement with the current care plan. All questions were answered. The patient knows to call the clinic with any problems, questions or concerns. We can certainly see the patient much sooner if necessary          No orders of the defined types were placed in this encounter.   The total time spent in the appointment was 30-39 minutes.   Nikiesha Milford L Quisha Mabie, PA-C 09/02/23

## 2023-09-10 ENCOUNTER — Inpatient Hospital Stay: Payer: Federal, State, Local not specified - PPO | Attending: Internal Medicine

## 2023-09-10 ENCOUNTER — Inpatient Hospital Stay: Payer: Federal, State, Local not specified - PPO

## 2023-09-10 ENCOUNTER — Inpatient Hospital Stay (HOSPITAL_BASED_OUTPATIENT_CLINIC_OR_DEPARTMENT_OTHER): Payer: Federal, State, Local not specified - PPO | Admitting: Physician Assistant

## 2023-09-10 ENCOUNTER — Encounter: Payer: Self-pay | Admitting: Internal Medicine

## 2023-09-10 VITALS — BP 146/64 | HR 85 | Temp 98.5°F | Resp 17 | Wt 147.4 lb

## 2023-09-10 DIAGNOSIS — Z791 Long term (current) use of non-steroidal anti-inflammatories (NSAID): Secondary | ICD-10-CM | POA: Diagnosis not present

## 2023-09-10 DIAGNOSIS — Z923 Personal history of irradiation: Secondary | ICD-10-CM | POA: Insufficient documentation

## 2023-09-10 DIAGNOSIS — C3411 Malignant neoplasm of upper lobe, right bronchus or lung: Secondary | ICD-10-CM

## 2023-09-10 DIAGNOSIS — Z79899 Other long term (current) drug therapy: Secondary | ICD-10-CM | POA: Diagnosis not present

## 2023-09-10 DIAGNOSIS — Z9221 Personal history of antineoplastic chemotherapy: Secondary | ICD-10-CM | POA: Insufficient documentation

## 2023-09-10 DIAGNOSIS — C778 Secondary and unspecified malignant neoplasm of lymph nodes of multiple regions: Secondary | ICD-10-CM | POA: Diagnosis not present

## 2023-09-10 DIAGNOSIS — J8410 Pulmonary fibrosis, unspecified: Secondary | ICD-10-CM

## 2023-09-10 DIAGNOSIS — R0602 Shortness of breath: Secondary | ICD-10-CM | POA: Diagnosis not present

## 2023-09-10 DIAGNOSIS — Z5112 Encounter for antineoplastic immunotherapy: Secondary | ICD-10-CM | POA: Diagnosis present

## 2023-09-10 DIAGNOSIS — F17210 Nicotine dependence, cigarettes, uncomplicated: Secondary | ICD-10-CM | POA: Diagnosis not present

## 2023-09-10 DIAGNOSIS — Z602 Problems related to living alone: Secondary | ICD-10-CM | POA: Insufficient documentation

## 2023-09-10 DIAGNOSIS — Z95828 Presence of other vascular implants and grafts: Secondary | ICD-10-CM

## 2023-09-10 DIAGNOSIS — C7951 Secondary malignant neoplasm of bone: Secondary | ICD-10-CM | POA: Insufficient documentation

## 2023-09-10 LAB — CBC WITH DIFFERENTIAL (CANCER CENTER ONLY)
Abs Immature Granulocytes: 0.02 10*3/uL (ref 0.00–0.07)
Basophils Absolute: 0.1 10*3/uL (ref 0.0–0.1)
Basophils Relative: 1 %
Eosinophils Absolute: 0.2 10*3/uL (ref 0.0–0.5)
Eosinophils Relative: 3 %
HCT: 43 % (ref 36.0–46.0)
Hemoglobin: 15.2 g/dL — ABNORMAL HIGH (ref 12.0–15.0)
Immature Granulocytes: 0 %
Lymphocytes Relative: 16 %
Lymphs Abs: 1.4 10*3/uL (ref 0.7–4.0)
MCH: 33.9 pg (ref 26.0–34.0)
MCHC: 35.3 g/dL (ref 30.0–36.0)
MCV: 95.8 fL (ref 80.0–100.0)
Monocytes Absolute: 0.7 10*3/uL (ref 0.1–1.0)
Monocytes Relative: 9 %
Neutro Abs: 6 10*3/uL (ref 1.7–7.7)
Neutrophils Relative %: 71 %
Platelet Count: 273 10*3/uL (ref 150–400)
RBC: 4.49 MIL/uL (ref 3.87–5.11)
RDW: 13.6 % (ref 11.5–15.5)
WBC Count: 8.4 10*3/uL (ref 4.0–10.5)
nRBC: 0 % (ref 0.0–0.2)

## 2023-09-10 LAB — CMP (CANCER CENTER ONLY)
ALT: 12 U/L (ref 0–44)
AST: 21 U/L (ref 15–41)
Albumin: 4.1 g/dL (ref 3.5–5.0)
Alkaline Phosphatase: 81 U/L (ref 38–126)
Anion gap: 8 (ref 5–15)
BUN: 16 mg/dL (ref 8–23)
CO2: 26 mmol/L (ref 22–32)
Calcium: 9.6 mg/dL (ref 8.9–10.3)
Chloride: 95 mmol/L — ABNORMAL LOW (ref 98–111)
Creatinine: 0.66 mg/dL (ref 0.44–1.00)
GFR, Estimated: >60 mL/min (ref >60)
Glucose, Bld: 92 mg/dL (ref 70–99)
Potassium: 4.2 mmol/L (ref 3.5–5.1)
Sodium: 129 mmol/L — ABNORMAL LOW (ref 135–145)
Total Bilirubin: 0.7 mg/dL (ref <1.2)
Total Protein: 7.4 g/dL (ref 6.5–8.1)

## 2023-09-10 MED ORDER — SODIUM CHLORIDE 0.9% FLUSH
10.0000 mL | INTRAVENOUS | Status: DC | PRN
  Administered 2023-09-10: 10 mL via INTRAVENOUS

## 2023-09-10 MED ORDER — SODIUM CHLORIDE 0.9 % IV SOLN
1500.0000 mg | Freq: Once | INTRAVENOUS | Status: AC
  Administered 2023-09-10: 1500 mg via INTRAVENOUS
  Filled 2023-09-10: qty 30

## 2023-09-10 MED ORDER — HEPARIN SOD (PORK) LOCK FLUSH 100 UNIT/ML IV SOLN
500.0000 [IU] | Freq: Once | INTRAVENOUS | Status: AC | PRN
  Administered 2023-09-10: 500 [IU]

## 2023-09-10 MED ORDER — SODIUM CHLORIDE 0.9 % IV SOLN: Freq: Once | INTRAVENOUS | Status: AC

## 2023-09-10 NOTE — Patient Instructions (Signed)
[facility] - A DEPT OF [facility]  Discharge Instructions: Thank you for choosing [facility] to provide your oncology and hematology care.   If you have a lab appointment with the Cancer Center, please go directly to the Cancer Center and check in at the registration area.   Wear comfortable clothing and clothing appropriate for easy access to any Portacath or PICC line.   We strive to give you quality time with your provider. You may need to reschedule your appointment if you arrive late (15 or more minutes).  Arriving late affects you and other patients whose appointments are after yours.  Also, if you miss three or more appointments without notifying the office, you may be dismissed from the clinic at the provider's discretion.      For prescription refill requests, have your pharmacy contact our office and allow 72 hours for refills to be completed.    Today you received the following chemotherapy and/or immunotherapy agents: Imfinzi      To help prevent nausea and vomiting after your treatment, we encourage you to take your nausea medication as directed.  BELOW ARE SYMPTOMS THAT SHOULD BE REPORTED IMMEDIATELY: *FEVER GREATER THAN 100.4 F (38 C) OR HIGHER *CHILLS OR SWEATING *NAUSEA AND VOMITING THAT IS NOT CONTROLLED WITH YOUR NAUSEA MEDICATION *UNUSUAL SHORTNESS OF BREATH *UNUSUAL BRUISING OR BLEEDING *URINARY PROBLEMS (pain or burning when urinating, or frequent urination) *BOWEL PROBLEMS (unusual diarrhea, constipation, pain near the anus) TENDERNESS IN MOUTH AND THROAT WITH OR WITHOUT PRESENCE OF ULCERS (sore throat, sores in mouth, or a toothache) UNUSUAL RASH, SWELLING OR PAIN  UNUSUAL VAGINAL DISCHARGE OR ITCHING   Items with * indicate a potential emergency and should be followed up as soon as possible or go to the Emergency Department if any problems should occur.  Please show the CHEMOTHERAPY ALERT CARD or IMMUNOTHERAPY  ALERT CARD at check-in to the Emergency Department and triage nurse.  Should you have questions after your visit or need to cancel or reschedule your appointment, please contact [facility] - A DEPT OF [facility]  Dept: [phone]  and follow the prompts.  Office hours are 8:00 a.m. to 4:30 p.m. Monday - Friday. Please note that voicemails left after 4:00 p.m. may not be returned until the following business day.  We are closed weekends and major holidays. You have access to a nurse at all times for urgent questions. Please call the main number to the clinic Dept: [phone] and follow the prompts.   For any non-urgent questions, you may also contact your provider using MyChart. We now offer e-Visits for anyone 80 and older to request care online for non-urgent symptoms. For details visit [email or web address].   Also download the MyChart app! Go to the app store, search "MyChart", open the app, select , and log in with your MyChart username and password.

## 2023-09-10 NOTE — Progress Notes (Signed)
SATURATION QUALIFICATIONS: (This note is used to comply with regulatory documentation for home oxygen)  Patient Saturations on Room Air at Rest = 90%  Patient Saturations on Room Air while Ambulating = 73%  Patient Saturations on 3 Liters of oxygen while Ambulating = 65%  Please briefly explain why patient needs home oxygen: Patient requires 3% of oxygen while ambulating to maintain saturations at an acceptable level . There is no other method to increase her oxygen saturation.  Oxygen ordered per Cassie and in basket message sent to [facility] staff.

## 2023-09-17 ENCOUNTER — Ambulatory Visit (HOSPITAL_COMMUNITY)
Admission: RE | Admit: 2023-09-17 | Discharge: 2023-09-17 | Disposition: A | Discharge status: Home / Self Care | Payer: Federal, State, Local not specified - PPO | Source: Ambulatory Visit | Attending: Physician Assistant | Admitting: Physician Assistant

## 2023-09-17 DIAGNOSIS — C3411 Malignant neoplasm of upper lobe, right bronchus or lung: Secondary | ICD-10-CM | POA: Insufficient documentation

## 2023-09-17 MED ORDER — HEPARIN SOD (PORK) LOCK FLUSH 100 UNIT/ML IV SOLN
INTRAVENOUS | Status: AC
  Filled 2023-09-17: qty 5

## 2023-09-17 MED ORDER — HEPARIN SOD (PORK) LOCK FLUSH 100 UNIT/ML IV SOLN
500.0000 [IU] | Freq: Once | INTRAVENOUS | Status: AC
  Administered 2023-09-17: 500 [IU] via INTRAVENOUS

## 2023-09-17 MED ORDER — IOHEXOL 300 MG/ML  SOLN
100.0000 mL | Freq: Once | INTRAMUSCULAR | Status: AC | PRN
  Administered 2023-09-17: 100 mL via INTRAVENOUS

## 2023-09-30 NOTE — Progress Notes (Signed)
 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    PRIOR THERAPY: 1) Palliative radiotherapy to the left proximal humerus under the care of Dr. Dewey. 2) Additional palliative radiotherapy to the proximal humerus under the care of Dr. Dewey. Last treatment on 04/21/2020  CURRENT THERAPY: Palliative systemic chemotherapy with carboplatin  for AUC of 5 on day 1, etoposide  100 mg/M2 on days 1, 2 and 3 as well as Imfinzi  1500 mg every 3 weeks with the chemotherapy and Neulasta  support. First dose on 03/16/2019. Status post 60 cycles. Starting from cycle #5 the patient will be treated with maintenance treatment with Imfinzi  1500 mg IV every 4 weeks   INTERVAL HISTORY: YECENIA DALGLEISH 82 y.o. female returns to the clinic today for a follow-up visit unaccompanied. At her last appointment, the patient was continuing to endorse intermittent dyspnea. We discussed referral to pulmonary medicine at several appointments due to significant underlying lung disease (pulmonary fibrosis and emphysema) although she has always declines and is reluctant to see any other providers/specialities.   She did qualify for supplemental oxygen  with walking when checked at her last appointment. An order was placed for supplemental oxygen  and I did place referral to pulmonary medicine. The patient did receive her oxygen  tank. However, she is not compliant with it because she does not like lugging it around. She is requesting a portable oxygen  concentrator like [employer]. We followed up with the oxygen  supply company who let us  know they scheduled an assessment for her on 12/11 but she cancelled her appointment and  did not want to reschedule. She lives alone and her daughter lives in [place] . When asked, she does not want us  contacting her daughter related to her health. She has financial concerns with these referrals in which I offered social work which she declined.   Today, she does not have any new concerns except wanting a portable oxygen  tank. Her oxygen  was 86% on RA since she was not wearing her oxygen  but improved to 96% on 2 L of supplemental oxygen . She states her shortness of breath is stable since lasting being seen.  She still smokes about 10-15 cigarettes per day. She may have intermittent cough but denies any changes in this. Denies chest pain or hemoptysis. Denies recent signs of infection such as sore throat, nasal congestions, or fever.    She denies any nausea or vomiting. Denies any rashes or skin changes. Denies any headache or visual changes. She denies diarrhea or constipation.  She recently had a restaging CT scan performed.  She is here today for evaluation repeat blood work before undergoing cycle #61  MEDICAL HISTORY: Past Medical History:  Diagnosis Date   Cataract    Bilateral   Constipation    pain medication   Left rotator cuff tear    Right rotator cuff tear    SCL CA dx'd 11/2018    ALLERGIES:  is allergic to oxycodone .  MEDICATIONS:  Current Outpatient Medications  Medication Sig Dispense Refill   albuterol  (VENTOLIN  HFA) 108 (90 Base) MCG/ACT inhaler Inhale 2 puffs into the lungs every 6 (six) hours as needed for wheezing or shortness of breath. 8 g 2   Ascorbic Acid (VITAMIN C  PO) Take by mouth daily.      b complex vitamins tablet Take 1 tablet by mouth daily.     COD LIVER OIL PO Take 1 tablet by mouth daily.      gabapentin  (NEURONTIN ) 100 MG capsule Take 1 capsule (100 mg total) by mouth 2 (two) times daily. 90 capsule 2   hydrocortisone  2.5 % cream Apply topically as needed. (Patient taking differently: Apply 1 application  topically daily as needed  (Treatment).) 3.5 g 0   ibuprofen (ADVIL) 200 MG tablet Take 600 mg by mouth daily.     lidocaine -prilocaine  (EMLA ) cream Apply 1 Application topically every 30 (thirty) days. 30 g 2   Multiple Vitamin (MULTIVITAMIN) tablet Take 1 tablet by mouth daily.     ondansetron  (ZOFRAN ) 4 MG tablet Take 1 tablet (4 mg total) by mouth every 8 (eight) hours as needed for nausea or vomiting. 10 tablet 0   oxymetazoline (AFRIN) 0.05 % nasal spray Place 2 sprays into the nose 2 (two) times daily as needed (Bloody nose).     prochlorperazine  (COMPAZINE ) 10 MG tablet Take 1 tablet (10 mg total) by mouth every 6 (six) hours as needed for nausea or vomiting. 30 tablet 0   VITAMIN E PO Take 1 tablet by mouth daily.      No current facility-administered medications for this visit.   Facility-Administered Medications Ordered in Other Visits  Medication Dose Route Frequency Provider Last Rate Last Admin   durvalumab  (IMFINZI ) 1,500 mg in sodium chloride  0.9 % 100 mL chemo infusion  1,500 mg Intravenous Once Sherrod Sherrod, MD 130 mL/hr at 10/08/23 1610 1,500 mg at 10/08/23 1610   heparin  lock flush 100 unit/mL  500 Units Intracatheter Once PRN Magrinat, Sandria BROCKS, MD       heparin  lock flush 100 unit/mL  500 Units Intracatheter Once PRN Sherrod Sherrod, MD       sodium chloride  flush (NS) 0.9 % injection 10 mL  10 mL Intracatheter PRN Magrinat, Sandria BROCKS, MD       sodium chloride  flush (NS) 0.9 % injection 10 mL  10 mL Intracatheter PRN Sherrod Sherrod, MD   10 mL at 10/08/23 1551    SURGICAL HISTORY:  Past Surgical History:  Procedure Laterality Date   CATARACT EXTRACTION W/ INTRAOCULAR LENS  IMPLANT, BILATERAL  2012   DECOMPRESSIVE LUMBAR LAMINECTOMY LEVEL 2 N/A 07/29/2013   Procedure: LUMBAR LAMINECTOMY, DECOMPRESSION LUMBAR THREE TO FOUR, FOUR TO FIVE microdiscectomy l3,4 right;  Surgeon: Tanda DELENA Heading, MD;  Location: [facility];  Service: Orthopedics;  Laterality: N/A;   I & D SUPERIOR RIGHT SHOULDER AND  CLOSURE WOUND  01-10-2011   S/P ROTATOR CUFF REPAIR   IR IMAGING GUIDED PORT INSERTION  04/09/2019   LUMBAR LAMINECTOMY  1970'S   LUMBAR LAMINECTOMY/DECOMPRESSION MICRODISCECTOMY N/A 06/27/2016   Procedure: L1 - L2 DISCECTOMY;  Surgeon: Donaciano Sprang, MD;  Location: [facility];  Service: Orthopedics;  Laterality: N/A;   LUMBAR SPINE SURGERY  1983   REVERSE SHOULDER ARTHROPLASTY Left 03/10/2020   Procedure: REVERSE SHOULDER ARTHROPLASTY;  Surgeon: Melita Drivers, MD;  Location: [facility];  Service: Orthopedics;  Laterality: Left;    RIGHT SHOULDER ARTHROSCOPY/ OPEN DISTAL CLAVICLE RESECTION/ SAD/ OPEN ROTATOR CUFF REPAIR  11-28-2010   SHOULDER FUSION SURGERY  03/11/2020   Shoulder Surgery , hardware placed   SHOULDER OPEN ROTATOR CUFF REPAIR  12/19/2011   Procedure: ROTATOR CUFF REPAIR SHOULDER OPEN;  Surgeon: Lynwood SHAUNNA Bern, MD;  Location: [facility];  Service: Orthopedics;  Laterality: Right;  RIGHT RECURRENT OPEN REPAIR OF THE ROTATOR CUFF WITH TISSUE MEND GRAFTANTERIOR CHROMIOECTOMY   VAGINAL HYSTERECTOMY  1979    REVIEW OF SYSTEMS:   Constitutional: Positive for fatigue. Negative for appetite change, chills, fever and unexpected weight change.  HENT: Negative for mouth sores, nosebleeds, sore throat and trouble swallowing.  Eyes: Negative for eye problems and icterus.  Respiratory: Positive for intermittent dyspnea on exertion and intermittent cough. Negative for hemoptysis and wheezing.   Cardiovascular: Negative for chest pain and leg swelling.  Gastrointestinal: Negative for abdominal pain, diarrhea, constipation, diarrhea, nausea and vomiting. Genitourinary: Negative for bladder incontinence, difficulty urinating, dysuria, frequency and hematuria.   Musculoskeletal: Positive for chronic back pain. Negative for gait problem, neck pain and neck stiffness.  Skin: Negative for itching and rash.  Neurological: Negative for dizziness, extremity weakness, gait problem,  headaches, light-headedness and seizures.  Hematological: Negative for adenopathy. Does not bruise/bleed easily.  Psychiatric/Behavioral: Positive for trouble sleeping the day before infusions. Negative for confusion, depression and sleep disturbance. The patient is not nervous/anxious    PHYSICAL EXAMINATION:  Blood pressure (!) 140/79, pulse 80, temperature 98.2 F (36.8 C), temperature source Temporal, resp. rate 18, weight 144 lb 3.2 oz (65.4 kg), SpO2 96%.  ECOG PERFORMANCE STATUS: 3  Physical Exam  Constitutional: Oriented to person, place, and time and chronically ill appearing female and in no distress.  HENT:  Head: Normocephalic and atraumatic.  Mouth/Throat: Oropharynx is clear and moist. No oropharyngeal exudate.  Eyes: Conjunctivae are normal. Right eye exhibits no discharge. Left eye exhibits no discharge. No scleral icterus.  Neck: Normal range of motion. Neck supple.  Cardiovascular: Normal rate, regular rhythm, normal heart sounds.   Pulmonary/Chest: Effort normal. Crackles noted at lung bases bilaterally (baseline). No respiratory distress.  Abdominal: Soft. Exhibits no distension and no mass. There is no tenderness.  Musculoskeletal: Normal range of motion. Exhibits no edema.  Lymphadenopathy:    No cervical adenopathy.  Neurological: Alert and oriented to person, place, and time. Exhibits muscle wasting. Examined in the wheelchair.  Skin: Skin is warm and dry. No rash noted. Not diaphoretic. No erythema. No pallor.  Psychiatric: Mood, memory and judgment normal.  Vitals reviewed.  LABORATORY DATA: Lab Results  Component Value Date   WBC 8.5 10/08/2023   HGB 14.7 10/08/2023   HCT 43.2 10/08/2023   MCV 94.9 10/08/2023   PLT 234 10/08/2023      Chemistry      Component Value Date/Time   NA 134 (L) 10/08/2023 1438   K 3.9 10/08/2023 1438   CL 102 10/08/2023 1438   CO2 24 10/08/2023 1438   BUN 16 10/08/2023 1438   CREATININE 0.73 10/08/2023 1438       Component Value Date/Time   CALCIUM  9.1 10/08/2023 1438   ALKPHOS 77 10/08/2023 1438   AST 22 10/08/2023 1438   ALT 14 10/08/2023 1438   BILITOT 0.5 10/08/2023 1438       RADIOGRAPHIC STUDIES:  CT CHEST ABDOMEN PELVIS W CONTRAST Result Date: 09/19/2023 CLINICAL DATA:  Small-cell lung cancer restaging, assess treatment response, worsening shortness of breath * Tracking Code: BO * EXAM: CT CHEST, ABDOMEN, AND PELVIS WITH CONTRAST TECHNIQUE: Multidetector CT imaging of the chest, abdomen and pelvis was performed following the standard protocol during bolus administration of intravenous contrast. RADIATION DOSE REDUCTION: This exam was performed according to the departmental dose-optimization program which includes automated exposure control, adjustment of the mA and/or kV according  to patient size and/or use of iterative reconstruction technique. CONTRAST:  OMNIPAQUE  IOHEXOL  300 MG/ML  SOLN COMPARISON:  07/11/2023 FINDINGS: CT CHEST FINDINGS Cardiovascular: Right neck vascular catheter. Aortic atherosclerosis. Normal heart size. Three-vessel coronary artery calcifications. No pericardial effusion. Mediastinum/Nodes: Unchanged prominent subcentimeter mediastinal and left hilar lymph nodes as well as treated soft tissue about the left hilum (series 2, image 77). Thyroid  gland, trachea, and esophagus demonstrate no significant findings. Lungs/Pleura: Moderate pulmonary fibrosis in an apical to basal gradient, with irregular peripheral interstitial opacity, septal thickening, traction bronchiectasis, and areas of honeycombing at the lung bases. Mild underlying centrilobular and paraseptal emphysema. Redemonstrated subpleural radiation fibrosis of the anterior left upper lobe. Continued increase in size of a spiculated nodule arising from the central inferior aspect of this region of fibrosis, measuring 2.0 x 1.3 cm, previously 1.2 x 0.9 cm when measured similarly (series 5, image 44). No pleural  effusion or pneumothorax. Musculoskeletal: No chest wall abnormality. No acute osseous findings. Status post left shoulder reverse arthroplasty about an osseous metastasis. CT ABDOMEN PELVIS FINDINGS Hepatobiliary: No solid liver abnormality is seen. No gallstones, gallbladder wall thickening, or biliary dilatation. Pancreas: Unremarkable. No pancreatic ductal dilatation or surrounding inflammatory changes. Spleen: Normal in size without significant abnormality. Adrenals/Urinary Tract: Adrenal glands are unremarkable. Severe dilatation of the right renal pelvis with abrupt caliber change at the ureteropelvic junction, in keeping with ureteropelvic junction stricture. Kidneys are otherwise normal, without renal calculi, solid lesion, or left-sided hydronephrosis. Bladder is unremarkable. Stomach/Bowel: Stomach is within normal limits. Appendix appears normal. No evidence of bowel wall thickening, distention, or inflammatory changes. Vascular/Lymphatic: Severe, pipe like aortic atherosclerosis. No enlarged abdominal or pelvic lymph nodes. Reproductive: Status post hysterectomy. Other: No abdominal wall hernia or abnormality. No ascites. Musculoskeletal: No acute osseous findings. IMPRESSION: 1. Redemonstrated subpleural radiation fibrosis of the anterior left upper lobe. Continued increase in size of a spiculated nodule arising from the central inferior aspect of this region of fibrosis, measuring 2.0 x 1.3 cm, previously 1.2 x 0.9 cm when measured similarly. Findings are consistent with malignancy. 2. Unchanged prominent subcentimeter mediastinal and left hilar lymph nodes as well as treated soft tissue about the left hilum. PET-CT may be helpful to assess for abnormal metabolic activity associated with any of these lymph nodes. 3. No evidence of lymphadenopathy or new metastatic disease in the abdomen or pelvis. 4. Status post left shoulder reverse arthroplasty about an osseous metastasis of the proximal left  humerus. 5. Moderate pulmonary fibrosis in an apical to basal gradient, with irregular peripheral interstitial opacity, septal thickening, traction bronchiectasis, and areas of honeycombing at the lung bases. 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 6. Emphysema. 7. Coronary artery disease. 8. Severe dilatation of the right renal pelvis with abrupt caliber change at the ureteropelvic junction, in keeping with ureteropelvic junction stricture. This may be assessed for functional significance by nuclear scintigraphic Lasix  renogram if desired. Aortic Atherosclerosis (ICD10-I70.0) and Emphysema (ICD10-J43.9). Electronically Signed   By: Marolyn JONETTA Jaksch M.D.   On: 09/19/2023 16:33     ASSESSMENT/PLAN:  This is a very pleasant 82 year old Caucasian female diagnosed with extensive stage (T1b, N3, M1 C) small cell lung cancer.  She presented with right upper lobe pulmonary nodules in addition to bilateral hilar and right mediastinal as well as left supraclavicular and abdominal lymphadenopathy.  She also has metastatic disease to the in the left humerus.  She was diagnosed in May 2020.   She is status post palliative radiotherapy to the  left proximal humerus under the care of Dr. Dewey in 2020.   She is currently undergoing palliative systemic chemotherapy with carboplatin  for AUC of 5 on day 1, etoposide  100 mg/M2 on days 1, 2 and 3 as well as Imfinzi  1500 mg every 3 weeks with the chemotherapy and Neulasta  support. Status post 60 cycles. Starting from cycle #5 the patient has been treated with maintenance treatment with Imfinzi  1500 mg IV every 4 weeks.   She had a pathological fracture in the left humerus underwent additional radiation to this area. Last treatment was 04/21/20.  The patient was seen with Dr. Sherrod today.  Dr. Sherrod personally and independently reviewed the scan and discussed results with the patient today.  The scan showed demonstration of the subpleural radiation fibrosis  in the left upper lobe and some increase in size of the spiculated nodule arising from the inferior aspect of this region which Dr. Sherrod recommends we continue to monitor.  She has unchanged subcentimeter mediastinal left hilar lymph nodes and treated soft tissue in the left hilum.  There was no new lymphadenopathy or metastatic disease.  She continues to have moderate pulmonary fibrosis with septal thickening, traction bronchiectasis, and honeycombing at the lung bases.  She also has emphysema.  Dr. Sherrod recommends she continue on the same treatment with immunotherapy for now with close monitoring.   However, once again, her breathing and oxygen  saturation likely secondary to the underlying lung disease with pulmonary fibrosis and emphysema. Her oxygen  quickly improved to 96% on 2 L of supplemental oxygen . She does have a oxygen  tank at the house, however, she is not compliant with this. I have strongly encouraged the patient to establish care with pulmonary medicine to see how they can optimize her breathing with her underlying lung disease and a PCP in the past and she is always reluctant and declines.  I have offered to refer her to social work if she has any financial concerns with seen specialist. Dr. Sherrod I both firmly talk to the patient about the importance of being compliant with her supplemental oxygen , especially since she lives alone and hypoxia can cause altered mental status.  She understands that hypoxia can be fatal.  The patient's closest family member is in [place] . The patient does not want me to call her daughter to update her on what we discussed with her.   We reached out to the oxygen  supply company about the status of an [employer] (portable oxygen  tank). They informed us  that the patient cancelled an office assessment that was on 12/11. I reached out to them to ask that they call her to reschedule. I also relayed to her infusion RN to let the patient know she needs to complete this  assessment to see if she qualifies for portable oxygen  tank. She was reportedly reluctant.   Labs were reviewed. Recommend she proceed with cycle #61 today as scheduled.    We will see her back for follow-up visit in 4 weeks for evaluation before starting cycle #62.   The patient was advised to call immediately if she has any concerning symptoms in the interval. The patient voices understanding of current disease status and treatment options and is in agreement with the current care plan. All questions were answered. The patient knows to call the clinic with any problems, questions or concerns. We can certainly see the patient much sooner if necessary     No orders of the defined types were placed in this encounter.  Master Touchet L Salem Lembke, PA-C 10/08/23  ADDENDUM: Hematology/Oncology Attending: I had a face-to-face encounter with the patient today.  I reviewed her records, lab, scan and recommended her care plan.  This is a very pleasant 82 years old white female with extensive stage small cell lung cancer diagnosed in May 2020.  She started systemic chemotherapy with carboplatin , etoposide  and Imfinzi  for 4 cycles followed by maintenance treatment with single agent Imfinzi  every 4 weeks status post 60 cycles.  The patient has been tolerating this treatment fairly well with no concerning adverse effects except for the baseline fatigue and shortness of breath secondary to COPD.  She currently has home oxygen  but she does not use it at regular basis. She had repeat CT scan of the chest, abdomen and pelvis performed recently.  I personally and independently reviewed the scan and discussed the result with the patient today.  Her scan showed no concerning findings for disease progression. I recommended for her to continue her current treatment with maintenance Imfinzi  every 4 weeks and she will proceed with cycle #61 today. I strongly encouraged the patient to use her home oxygen  at regular  basis and to consult with pulmonary medicine for management of her COPD. She will come back for follow-up visit in 4 weeks for evaluation before the next cycle of her treatment. The patient was advised to call immediately if she has any other concerning symptoms in the interval. The total time spent in the appointment was 30 minutes. Disclaimer: This note was dictated with voice recognition software. Similar sounding words can inadvertently be transcribed and may be missed upon review. Sherrod MARLA Sherrod, MD

## 2023-10-08 ENCOUNTER — Inpatient Hospital Stay: Payer: Federal, State, Local not specified - PPO

## 2023-10-08 ENCOUNTER — Inpatient Hospital Stay (HOSPITAL_BASED_OUTPATIENT_CLINIC_OR_DEPARTMENT_OTHER): Payer: Federal, State, Local not specified - PPO | Admitting: Physician Assistant

## 2023-10-08 VITALS — BP 140/79 | HR 80 | Temp 98.2°F | Resp 18 | Wt 144.2 lb

## 2023-10-08 VITALS — BP 126/90 | HR 78

## 2023-10-08 DIAGNOSIS — Z5112 Encounter for antineoplastic immunotherapy: Secondary | ICD-10-CM

## 2023-10-08 DIAGNOSIS — C3411 Malignant neoplasm of upper lobe, right bronchus or lung: Secondary | ICD-10-CM

## 2023-10-08 DIAGNOSIS — Z95828 Presence of other vascular implants and grafts: Secondary | ICD-10-CM

## 2023-10-08 LAB — CMP (CANCER CENTER ONLY)
ALT: 14 U/L (ref 0–44)
AST: 22 U/L (ref 15–41)
Albumin: 3.9 g/dL (ref 3.5–5.0)
Alkaline Phosphatase: 77 U/L (ref 38–126)
Anion gap: 8 (ref 5–15)
BUN: 16 mg/dL (ref 8–23)
CO2: 24 mmol/L (ref 22–32)
Calcium: 9.1 mg/dL (ref 8.9–10.3)
Chloride: 102 mmol/L (ref 98–111)
Creatinine: 0.73 mg/dL (ref 0.44–1.00)
GFR, Estimated: >60 mL/min (ref >60)
Glucose, Bld: 99 mg/dL (ref 70–99)
Potassium: 3.9 mmol/L (ref 3.5–5.1)
Sodium: 134 mmol/L — ABNORMAL LOW (ref 135–145)
Total Bilirubin: 0.5 mg/dL (ref 0.0–1.2)
Total Protein: 7.4 g/dL (ref 6.5–8.1)

## 2023-10-08 LAB — CBC WITH DIFFERENTIAL (CANCER CENTER ONLY)
Abs Immature Granulocytes: 0.02 10*3/uL (ref 0.00–0.07)
Basophils Absolute: 0 10*3/uL (ref 0.0–0.1)
Basophils Relative: 1 %
Eosinophils Absolute: 0.3 10*3/uL (ref 0.0–0.5)
Eosinophils Relative: 3 %
HCT: 43.2 % (ref 36.0–46.0)
Hemoglobin: 14.7 g/dL (ref 12.0–15.0)
Immature Granulocytes: 0 %
Lymphocytes Relative: 16 %
Lymphs Abs: 1.4 10*3/uL (ref 0.7–4.0)
MCH: 32.3 pg (ref 26.0–34.0)
MCHC: 34 g/dL (ref 30.0–36.0)
MCV: 94.9 fL (ref 80.0–100.0)
Monocytes Absolute: 0.6 10*3/uL (ref 0.1–1.0)
Monocytes Relative: 7 %
Neutro Abs: 6.2 10*3/uL (ref 1.7–7.7)
Neutrophils Relative %: 73 %
Platelet Count: 234 10*3/uL (ref 150–400)
RBC: 4.55 MIL/uL (ref 3.87–5.11)
RDW: 13 % (ref 11.5–15.5)
WBC Count: 8.5 10*3/uL (ref 4.0–10.5)
nRBC: 0 % (ref 0.0–0.2)

## 2023-10-08 MED ORDER — SODIUM CHLORIDE 0.9% FLUSH
10.0000 mL | INTRAVENOUS | Status: DC | PRN
  Administered 2023-10-08: 10 mL via INTRAVENOUS

## 2023-10-08 MED ORDER — SODIUM CHLORIDE 0.9 % IV SOLN: Freq: Once | INTRAVENOUS | Status: AC

## 2023-10-08 MED ORDER — SODIUM CHLORIDE 0.9% FLUSH
10.0000 mL | INTRAVENOUS | Status: DC | PRN
  Administered 2023-10-08 (×2): 10 mL

## 2023-10-08 MED ORDER — SODIUM CHLORIDE 0.9 % IV SOLN
1500.0000 mg | Freq: Once | INTRAVENOUS | Status: AC
  Administered 2023-10-08: 1500 mg via INTRAVENOUS
  Filled 2023-10-08: qty 30

## 2023-10-08 MED ORDER — HEPARIN SOD (PORK) LOCK FLUSH 100 UNIT/ML IV SOLN
500.0000 [IU] | Freq: Once | INTRAVENOUS | Status: AC | PRN
Start: 2023-10-08 — End: 2023-10-08
  Administered 2023-10-08: 500 [IU]

## 2023-10-08 NOTE — Progress Notes (Signed)
Pt took O2 off while here & O2 sat down to 87.  Replaced O2 2 2l/min & sat  came back up to 95.  She doesn't have O2 with her.  Instructed pt & neighbor that she needs her O2 when she gets home.

## 2023-10-08 NOTE — Patient Instructions (Signed)
 [facility] - A DEPT OF [facility]  Discharge Instructions: Thank you for choosing [facility] to provide your oncology and hematology care.   If you have a lab appointment with the Cancer Center, please go directly to the Cancer Center and check in at the registration area.   Wear comfortable clothing and clothing appropriate for easy access to any Portacath or PICC line.   We strive to give you quality time with your provider. You may need to reschedule your appointment if you arrive late (15 or more minutes).  Arriving late affects you and other patients whose appointments are after yours.  Also, if you miss three or more appointments without notifying the office, you may be dismissed from the clinic at the provider's discretion.      For prescription refill requests, have your pharmacy contact our office and allow 72 hours for refills to be completed.    Today you received the following chemotherapy and/or immunotherapy agents: durvalumab       To help prevent nausea and vomiting after your treatment, we encourage you to take your nausea medication as directed.  BELOW ARE SYMPTOMS THAT SHOULD BE REPORTED IMMEDIATELY: *FEVER GREATER THAN 100.4 F (38 C) OR HIGHER *CHILLS OR SWEATING *NAUSEA AND VOMITING THAT IS NOT CONTROLLED WITH YOUR NAUSEA MEDICATION *UNUSUAL SHORTNESS OF BREATH *UNUSUAL BRUISING OR BLEEDING *URINARY PROBLEMS (pain or burning when urinating, or frequent urination) *BOWEL PROBLEMS (unusual diarrhea, constipation, pain near the anus) TENDERNESS IN MOUTH AND THROAT WITH OR WITHOUT PRESENCE OF ULCERS (sore throat, sores in mouth, or a toothache) UNUSUAL RASH, SWELLING OR PAIN  UNUSUAL VAGINAL DISCHARGE OR ITCHING   Items with * indicate a potential emergency and should be followed up as soon as possible or go to the Emergency Department if any problems should occur.  Please show the CHEMOTHERAPY ALERT CARD or IMMUNOTHERAPY  ALERT CARD at check-in to the Emergency Department and triage nurse.  Should you have questions after your visit or need to cancel or reschedule your appointment, please contact [facility] - A DEPT OF [facility]  Dept: [phone]  and follow the prompts.  Office hours are 8:00 a.m. to 4:30 p.m. Monday - Friday. Please note that voicemails left after 4:00 p.m. may not be returned until the following business day.  We are closed weekends and major holidays. You have access to a nurse at all times for urgent questions. Please call the main number to the clinic Dept: [phone] and follow the prompts.   For any non-urgent questions, you may also contact your provider using MyChart. We now offer e-Visits for anyone 26 and older to request care online for non-urgent symptoms. For details visit [email or web address].   Also download the MyChart app! Go to the app store, search MyChart, open the app, select [place], and log in with your MyChart username and password.

## 2023-10-09 ENCOUNTER — Other Ambulatory Visit: Payer: Self-pay

## 2023-10-11 ENCOUNTER — Encounter: Payer: Self-pay | Admitting: Medical Oncology

## 2023-10-22 ENCOUNTER — Other Ambulatory Visit: Payer: Self-pay

## 2023-11-05 ENCOUNTER — Inpatient Hospital Stay: Payer: Federal, State, Local not specified - PPO | Attending: Internal Medicine

## 2023-11-05 ENCOUNTER — Inpatient Hospital Stay: Payer: Federal, State, Local not specified - PPO

## 2023-11-05 ENCOUNTER — Inpatient Hospital Stay (HOSPITAL_BASED_OUTPATIENT_CLINIC_OR_DEPARTMENT_OTHER): Payer: Federal, State, Local not specified - PPO | Admitting: Internal Medicine

## 2023-11-05 VITALS — BP 131/90 | HR 85 | Temp 98.1°F | Resp 16 | Ht 62.0 in | Wt 142.9 lb

## 2023-11-05 DIAGNOSIS — Z95828 Presence of other vascular implants and grafts: Secondary | ICD-10-CM

## 2023-11-05 DIAGNOSIS — K5900 Constipation, unspecified: Secondary | ICD-10-CM | POA: Insufficient documentation

## 2023-11-05 DIAGNOSIS — Z5112 Encounter for antineoplastic immunotherapy: Secondary | ICD-10-CM | POA: Insufficient documentation

## 2023-11-05 DIAGNOSIS — Z79899 Other long term (current) drug therapy: Secondary | ICD-10-CM | POA: Insufficient documentation

## 2023-11-05 DIAGNOSIS — C7951 Secondary malignant neoplasm of bone: Secondary | ICD-10-CM | POA: Diagnosis not present

## 2023-11-05 DIAGNOSIS — R0602 Shortness of breath: Secondary | ICD-10-CM | POA: Diagnosis not present

## 2023-11-05 DIAGNOSIS — C3411 Malignant neoplasm of upper lobe, right bronchus or lung: Secondary | ICD-10-CM | POA: Insufficient documentation

## 2023-11-05 DIAGNOSIS — Z791 Long term (current) use of non-steroidal anti-inflammatories (NSAID): Secondary | ICD-10-CM | POA: Diagnosis not present

## 2023-11-05 DIAGNOSIS — G4700 Insomnia, unspecified: Secondary | ICD-10-CM | POA: Insufficient documentation

## 2023-11-05 LAB — CBC WITH DIFFERENTIAL (CANCER CENTER ONLY)
Abs Immature Granulocytes: 0.03 10*3/uL (ref 0.00–0.07)
Basophils Absolute: 0.1 10*3/uL (ref 0.0–0.1)
Basophils Relative: 1 %
Eosinophils Absolute: 0.3 10*3/uL (ref 0.0–0.5)
Eosinophils Relative: 3 %
HCT: 41.4 % (ref 36.0–46.0)
Hemoglobin: 14.4 g/dL (ref 12.0–15.0)
Immature Granulocytes: 0 %
Lymphocytes Relative: 12 %
Lymphs Abs: 1.3 10*3/uL (ref 0.7–4.0)
MCH: 32.9 pg (ref 26.0–34.0)
MCHC: 34.8 g/dL (ref 30.0–36.0)
MCV: 94.5 fL (ref 80.0–100.0)
Monocytes Absolute: 0.8 10*3/uL (ref 0.1–1.0)
Monocytes Relative: 8 %
Neutro Abs: 7.9 10*3/uL — ABNORMAL HIGH (ref 1.7–7.7)
Neutrophils Relative %: 76 %
Platelet Count: 230 10*3/uL (ref 150–400)
RBC: 4.38 MIL/uL (ref 3.87–5.11)
RDW: 12.7 % (ref 11.5–15.5)
WBC Count: 10.3 10*3/uL (ref 4.0–10.5)
nRBC: 0 % (ref 0.0–0.2)

## 2023-11-05 LAB — CMP (CANCER CENTER ONLY)
ALT: 12 U/L (ref 0–44)
AST: 20 U/L (ref 15–41)
Albumin: 3.9 g/dL (ref 3.5–5.0)
Alkaline Phosphatase: 73 U/L (ref 38–126)
Anion gap: 6 (ref 5–15)
BUN: 19 mg/dL (ref 8–23)
CO2: 27 mmol/L (ref 22–32)
Calcium: 9.5 mg/dL (ref 8.9–10.3)
Chloride: 97 mmol/L — ABNORMAL LOW (ref 98–111)
Creatinine: 0.67 mg/dL (ref 0.44–1.00)
GFR, Estimated: >60 mL/min (ref >60)
Glucose, Bld: 118 mg/dL — ABNORMAL HIGH (ref 70–99)
Potassium: 3.7 mmol/L (ref 3.5–5.1)
Sodium: 130 mmol/L — ABNORMAL LOW (ref 135–145)
Total Bilirubin: 0.5 mg/dL (ref 0.0–1.2)
Total Protein: 7.2 g/dL (ref 6.5–8.1)

## 2023-11-05 MED ORDER — SODIUM CHLORIDE 0.9% FLUSH
10.0000 mL | INTRAVENOUS | Status: DC | PRN
  Administered 2023-11-05: 10 mL via INTRAVENOUS

## 2023-11-05 MED ORDER — HEPARIN SOD (PORK) LOCK FLUSH 100 UNIT/ML IV SOLN
500.0000 [IU] | Freq: Once | INTRAVENOUS | Status: AC | PRN
  Administered 2023-11-05: 500 [IU]

## 2023-11-05 MED ORDER — SODIUM CHLORIDE 0.9 % IV SOLN: Freq: Once | INTRAVENOUS | Status: AC

## 2023-11-05 MED ORDER — SODIUM CHLORIDE 0.9 % IV SOLN
1500.0000 mg | Freq: Once | INTRAVENOUS | Status: AC
Start: 2023-11-05 — End: 2023-11-05
  Administered 2023-11-05: 1500 mg via INTRAVENOUS
  Filled 2023-11-05: qty 30

## 2023-11-05 MED ORDER — SODIUM CHLORIDE 0.9% FLUSH
10.0000 mL | INTRAVENOUS | Status: DC | PRN
  Administered 2023-11-05: 10 mL

## 2023-11-05 NOTE — Progress Notes (Signed)
error

## 2023-11-05 NOTE — Patient Instructions (Signed)
[facility] - A DEPT OF [facility]  Discharge Instructions: Thank you for choosing [facility] to provide your oncology and hematology care.   If you have a lab appointment with the Cancer Center, please go directly to the Cancer Center and check in at the registration area.   Wear comfortable clothing and clothing appropriate for easy access to any Portacath or PICC line.   We strive to give you quality time with your provider. You may need to reschedule your appointment if you arrive late (15 or more minutes).  Arriving late affects you and other patients whose appointments are after yours.  Also, if you miss three or more appointments without notifying the office, you may be dismissed from the clinic at the provider's discretion.      For prescription refill requests, have your pharmacy contact our office and allow 72 hours for refills to be completed.    Today you received the following chemotherapy and/or immunotherapy agents :  durvalumab      To help prevent nausea and vomiting after your treatment, we encourage you to take your nausea medication as directed.  BELOW ARE SYMPTOMS THAT SHOULD BE REPORTED IMMEDIATELY: *FEVER GREATER THAN 100.4 F (38 C) OR HIGHER *CHILLS OR SWEATING *NAUSEA AND VOMITING THAT IS NOT CONTROLLED WITH YOUR NAUSEA MEDICATION *UNUSUAL SHORTNESS OF BREATH *UNUSUAL BRUISING OR BLEEDING *URINARY PROBLEMS (pain or burning when urinating, or frequent urination) *BOWEL PROBLEMS (unusual diarrhea, constipation, pain near the anus) TENDERNESS IN MOUTH AND THROAT WITH OR WITHOUT PRESENCE OF ULCERS (sore throat, sores in mouth, or a toothache) UNUSUAL RASH, SWELLING OR PAIN  UNUSUAL VAGINAL DISCHARGE OR ITCHING   Items with * indicate a potential emergency and should be followed up as soon as possible or go to the Emergency Department if any problems should occur.  Please show the CHEMOTHERAPY ALERT CARD or  IMMUNOTHERAPY ALERT CARD at check-in to the Emergency Department and triage nurse.  Should you have questions after your visit or need to cancel or reschedule your appointment, please contact [facility] - A DEPT [facility]  Dept: [phone]  and follow the prompts.  Office hours are 8:00 a.m. to 4:30 p.m. Monday - Friday. Please note that voicemails left after 4:00 p.m. may not be returned until the following business day.  We are closed weekends and major holidays. You have access to a nurse at all times for urgent questions. Please call the main number to the clinic Dept: [phone] and follow the prompts.   For any non-urgent questions, you may also contact your provider using MyChart. We now offer e-Visits for anyone 30 and older to request care online for non-urgent symptoms. For details visit [email or web address].   Also download the MyChart app! Go to the app store, search "MyChart", open the app, select [place], and log in with your MyChart username and password.

## 2023-11-05 NOTE — Progress Notes (Signed)
SATURATION QUALIFICATIONS: (This note is used to comply with regulatory documentation for home oxygen)  Patient Saturations on Room Air at Rest = 91%  Patient Saturations on Room Air while Ambulating = 85%  Patient Saturations on 3 Liters of oxygen while Ambulating = 92%  Please briefly explain why patient needs home oxygen: Patient requires 3% of oxygen while ambulating to maintain saturations at an acceptable level . There is no other method to increase her oxygen saturation.

## 2023-11-05 NOTE — Progress Notes (Signed)
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   PRIOR THERAPY: None   CURRENT THERAPY: Palliative systemic chemotherapy with carboplatin for AUC of 5 on day 1, etoposide 100 mg/M2 on days 1, 2 and 3 as well as Imfinzi 1500 mg every 3 weeks with the chemotherapy and Neulasta support. First dose on 03/16/2019. Status post 61 cycles.   Starting from cycle #5 the patient will be treated with maintenance treatment with Imfinzi 1500 mg IV every 4 weeks.  INTERVAL HISTORY: Brittany Marks 83 y.o. female returns to clinic today for follow-up visit.Discussed the use of AI scribe software for clinical note transcription with the patient, who gave verbal consent to proceed.  History of Present Illness   The patient is a 83 year old female with extensive stage small cell lung cancer who presents for chemotherapy follow-up.  She was initially diagnosed with extensive stage small cell lung cancer in May 2020 and began chemotherapy with carboplatin and etoposide. She is currently undergoing cycle number sixty-two of chemotherapy. Despite the aggressive nature of the cancer, she has been responding well to treatment, surviving beyond the typical prognosis for this type of cancer.  She experiences intermittent shortness of breath. Attempts to arrange home oxygen have been delayed due to logistical issues, and she is scheduled for another test today to address this.  She has difficulty sleeping the night before her appointments, attributing it to anxiety about the visits. She does not take any sleeping aids.  No chest pain,  nausea, vomiting, diarrhea, or recent weight loss.       MEDICAL HISTORY: Past Medical History:  Diagnosis Date   Cataract    Bilateral   Constipation    pain medication   Left rotator cuff tear    Right rotator cuff tear    SCL CA dx'd 11/2018    ALLERGIES:  is allergic to oxycodone.  MEDICATIONS:  Current Outpatient Medications  Medication Sig Dispense Refill   albuterol (VENTOLIN HFA) 108 (90 Base) MCG/ACT inhaler Inhale 2 puffs into the lungs every 6 (six) hours as needed for wheezing or shortness of breath. 8 g 2   Ascorbic Acid (VITAMIN C PO) Take by mouth daily.      b complex vitamins tablet Take 1 tablet by mouth daily.     COD LIVER OIL PO Take 1 tablet by mouth daily.      gabapentin (NEURONTIN) 100 MG capsule Take 1 capsule (100 mg total) by mouth 2 (two) times daily. 90 capsule 2   hydrocortisone 2.5 % cream Apply topically as needed. (Patient taking differently: Apply 1 application  topically daily as needed (Treatment).) 3.5 g 0   ibuprofen (ADVIL) 200 MG tablet Take 600 mg by mouth daily.     lidocaine-prilocaine (EMLA) cream Apply 1 Application topically every 30 (thirty) days. 30 g 2   Multiple Vitamin (MULTIVITAMIN) tablet Take 1 tablet by mouth daily.     ondansetron (ZOFRAN) 4 MG tablet Take 1 tablet (4 mg total) by mouth every 8 (eight) hours as needed for nausea or vomiting. 10 tablet 0   oxymetazoline (AFRIN) 0.05 % nasal  spray Place 2 sprays into the nose 2 (two) times daily as needed (Bloody nose).     prochlorperazine (COMPAZINE) 10 MG tablet Take 1 tablet (10 mg total) by mouth every 6 (six) hours as needed for nausea or vomiting. 30 tablet 0   VITAMIN E PO Take 1 tablet by mouth daily.      No current facility-administered medications for this visit.   Facility-Administered Medications Ordered in Other Visits  Medication Dose Route Frequency Provider Last Rate Last Admin   heparin lock flush 100 unit/mL  500 Units Intracatheter Once PRN Magrinat,  Valentino Hue, MD       sodium chloride flush (NS) 0.9 % injection 10 mL  10 mL Intracatheter PRN Magrinat, Valentino Hue, MD        SURGICAL HISTORY:  Past Surgical History:  Procedure Laterality Date   CATARACT EXTRACTION W/ INTRAOCULAR LENS  IMPLANT, BILATERAL  2012   DECOMPRESSIVE LUMBAR LAMINECTOMY LEVEL 2 N/A 07/29/2013   Procedure: LUMBAR LAMINECTOMY, DECOMPRESSION LUMBAR THREE TO FOUR, FOUR TO FIVE microdiscectomy l3,4 right;  Surgeon: Jacki Cones, MD;  [facility];  Service: Orthopedics;  Laterality: N/A;   I & D SUPERIOR RIGHT SHOULDER AND CLOSURE WOUND  01-10-2011   S/P ROTATOR CUFF REPAIR   IR IMAGING GUIDED PORT INSERTION  04/09/2019   LUMBAR LAMINECTOMY  1970'S   LUMBAR LAMINECTOMY/DECOMPRESSION MICRODISCECTOMY N/A 06/27/2016   Procedure: L1 - L2 DISCECTOMY;  Surgeon: Venita Lick, MD;  Location: [facility];  Service: Orthopedics;  Laterality: N/A;   LUMBAR SPINE SURGERY  1983   REVERSE SHOULDER ARTHROPLASTY Left 03/10/2020   Procedure: REVERSE SHOULDER ARTHROPLASTY;  Surgeon: Francena Hanly, MD;  Location: [facility];  Service: Orthopedics;  Laterality: Left;    RIGHT SHOULDER ARTHROSCOPY/ OPEN DISTAL CLAVICLE RESECTION/ SAD/ OPEN ROTATOR CUFF REPAIR  11-28-2010   SHOULDER FUSION SURGERY  03/11/2020   Shoulder Surgery , hardware placed   SHOULDER OPEN ROTATOR CUFF REPAIR  12/19/2011   Procedure: ROTATOR CUFF REPAIR SHOULDER OPEN;  Surgeon: Drucilla Schmidt, MD;  Location: [facility];  Service: Orthopedics;  Laterality: Right;  RIGHT RECURRENT OPEN REPAIR OF THE ROTATOR CUFF WITH TISSUE MEND GRAFTANTERIOR CHROMIOECTOMY   VAGINAL HYSTERECTOMY  1979    REVIEW OF SYSTEMS:  A comprehensive review of systems was negative except for: Constitutional: positive for fatigue Respiratory: positive for dyspnea on exertion Musculoskeletal: positive for muscle weakness   PHYSICAL EXAMINATION: General appearance: alert, cooperative, fatigued, and no distress Head:  Normocephalic, without obvious abnormality, atraumatic Neck: no adenopathy, no JVD, supple, symmetrical, trachea midline, and thyroid not enlarged, symmetric, no tenderness/mass/nodules Lymph nodes: Cervical, supraclavicular, and axillary nodes normal. Resp: clear to auscultation bilaterally Back: symmetric, no curvature. ROM normal. No CVA tenderness. Cardio: regular rate and rhythm, S1, S2 normal, no murmur, click, rub or gallop GI: soft, non-tender; bowel sounds normal; no masses,  no organomegaly Extremities: extremities normal, atraumatic, no cyanosis or edema   ECOG PERFORMANCE STATUS: 1 - Symptomatic but completely ambulatory  Blood pressure (!) 131/90, pulse 85, temperature 98.1 F (36.7 C), temperature source Temporal, resp. rate 16, height 5\' 2"  (1.575 m), weight 142 lb 14.4 oz (64.8 kg), SpO2 90%.  LABORATORY DATA: Lab Results  Component Value Date   WBC 10.3 11/05/2023   HGB 14.4 11/05/2023   HCT 41.4 11/05/2023   MCV 94.5 11/05/2023   PLT 230 11/05/2023      Chemistry      Component Value Date/Time   NA 134 (L)  10/08/2023 1438   K 3.9 10/08/2023 1438   CL 102 10/08/2023 1438   CO2 24 10/08/2023 1438   BUN 16 10/08/2023 1438   CREATININE 0.73 10/08/2023 1438      Component Value Date/Time   CALCIUM 9.1 10/08/2023 1438   ALKPHOS 77 10/08/2023 1438   AST 22 10/08/2023 1438   ALT 14 10/08/2023 1438   BILITOT 0.5 10/08/2023 1438       RADIOGRAPHIC STUDIES: No results found.   ASSESSMENT AND PLAN: This is a very pleasant 83 years old white female with extensive stage small cell lung cancer and she is currently undergoing treatment with carboplatin, etoposide and Imfinzi status post 5 cycles.  Starting from cycle #6 the patient is on maintenance treatment with single agent Imfinzi every 4 weeks status post 61 cycles of total treatment. The patient has been tolerating this treatment fairly well with no concerning adverse effects.    Extensive Stage Small Cell  Lung Cancer Diagnosed in May 2020. Currently undergoing chemotherapy with carboplatin, etoposide, and atezolizumab. Condition is well-managed with no recent weight loss, nausea, or vomiting. Intermittent dyspnea, no chest pain. Blood work within normal limits. Survival beyond five years is rare for this diagnosis. Treatment will continue indefinitely unless it ceases to be effective or adverse events occur. - Proceed with cycle 62 of chemotherapy today - Run oxygen saturation test - Follow-up in four weeks  Insomnia Experiences difficulty sleeping the night before appointments due to anxiety. No current use of sleep aids. Discussed potential use of diphenhydramine (Tylenol PM) as a sleep aid. - Consider taking diphenhydramine the night before appointments  Follow-up - Follow-up in four weeks.   The patient was advised to call immediately if she has any concerning symptoms in the interval. The patient voices understanding of current disease status and treatment options and is in agreement with the current care plan.  All questions were answered. The patient knows to call the clinic with any problems, questions or concerns. We can certainly see the patient much sooner if necessary. The total time spent in the appointment was 20 minutes.  Disclaimer: This note was dictated with voice recognition software. Similar sounding words can inadvertently be transcribed and may not be corrected upon review.

## 2023-11-06 ENCOUNTER — Telehealth: Payer: Self-pay

## 2023-11-06 NOTE — Telephone Encounter (Signed)
Patient seen Dr. Arbutus Ped 1/28 and office performed O2 test.  Sent results to [facility].  Per Florina Ou from [facility]- Patient has been contacted and VM left for return call for an appt in our office for POC (portable oxygen concentrator) evaluation to be performed.

## 2023-11-30 ENCOUNTER — Other Ambulatory Visit: Payer: Self-pay

## 2023-12-03 ENCOUNTER — Inpatient Hospital Stay: Payer: Federal, State, Local not specified - PPO | Attending: Internal Medicine | Admitting: Internal Medicine

## 2023-12-03 ENCOUNTER — Inpatient Hospital Stay: Payer: Federal, State, Local not specified - PPO

## 2023-12-03 DIAGNOSIS — Z79899 Other long term (current) drug therapy: Secondary | ICD-10-CM | POA: Diagnosis not present

## 2023-12-03 DIAGNOSIS — C7951 Secondary malignant neoplasm of bone: Secondary | ICD-10-CM | POA: Diagnosis not present

## 2023-12-03 DIAGNOSIS — G629 Polyneuropathy, unspecified: Secondary | ICD-10-CM | POA: Diagnosis not present

## 2023-12-03 DIAGNOSIS — G6289 Other specified polyneuropathies: Secondary | ICD-10-CM

## 2023-12-03 DIAGNOSIS — Z791 Long term (current) use of non-steroidal anti-inflammatories (NSAID): Secondary | ICD-10-CM | POA: Diagnosis not present

## 2023-12-03 DIAGNOSIS — Z9221 Personal history of antineoplastic chemotherapy: Secondary | ICD-10-CM | POA: Insufficient documentation

## 2023-12-03 DIAGNOSIS — R0902 Hypoxemia: Secondary | ICD-10-CM | POA: Diagnosis not present

## 2023-12-03 DIAGNOSIS — Z5112 Encounter for antineoplastic immunotherapy: Secondary | ICD-10-CM | POA: Insufficient documentation

## 2023-12-03 DIAGNOSIS — C3411 Malignant neoplasm of upper lobe, right bronchus or lung: Secondary | ICD-10-CM

## 2023-12-03 DIAGNOSIS — C778 Secondary and unspecified malignant neoplasm of lymph nodes of multiple regions: Secondary | ICD-10-CM | POA: Insufficient documentation

## 2023-12-03 DIAGNOSIS — K5900 Constipation, unspecified: Secondary | ICD-10-CM | POA: Insufficient documentation

## 2023-12-03 DIAGNOSIS — Z95828 Presence of other vascular implants and grafts: Secondary | ICD-10-CM

## 2023-12-03 LAB — CBC WITH DIFFERENTIAL (CANCER CENTER ONLY)
Abs Immature Granulocytes: 0.02 10*3/uL (ref 0.00–0.07)
Basophils Absolute: 0.1 10*3/uL (ref 0.0–0.1)
Basophils Relative: 1 %
Eosinophils Absolute: 0.2 10*3/uL (ref 0.0–0.5)
Eosinophils Relative: 3 %
HCT: 40.4 % (ref 36.0–46.0)
Hemoglobin: 14 g/dL (ref 12.0–15.0)
Immature Granulocytes: 0 %
Lymphocytes Relative: 14 %
Lymphs Abs: 1.2 10*3/uL (ref 0.7–4.0)
MCH: 32.8 pg (ref 26.0–34.0)
MCHC: 34.7 g/dL (ref 30.0–36.0)
MCV: 94.6 fL (ref 80.0–100.0)
Monocytes Absolute: 0.7 10*3/uL (ref 0.1–1.0)
Monocytes Relative: 8 %
Neutro Abs: 6.7 10*3/uL (ref 1.7–7.7)
Neutrophils Relative %: 74 %
Platelet Count: 236 10*3/uL (ref 150–400)
RBC: 4.27 MIL/uL (ref 3.87–5.11)
RDW: 13.3 % (ref 11.5–15.5)
WBC Count: 8.8 10*3/uL (ref 4.0–10.5)
nRBC: 0 % (ref 0.0–0.2)

## 2023-12-03 LAB — CMP (CANCER CENTER ONLY)
ALT: 11 U/L (ref 0–44)
AST: 20 U/L (ref 15–41)
Albumin: 3.8 g/dL (ref 3.5–5.0)
Alkaline Phosphatase: 77 U/L (ref 38–126)
Anion gap: 7 (ref 5–15)
BUN: 18 mg/dL (ref 8–23)
CO2: 26 mmol/L (ref 22–32)
Calcium: 9.2 mg/dL (ref 8.9–10.3)
Chloride: 101 mmol/L (ref 98–111)
Creatinine: 0.68 mg/dL (ref 0.44–1.00)
GFR, Estimated: >60 mL/min (ref >60)
Glucose, Bld: 104 mg/dL — ABNORMAL HIGH (ref 70–99)
Potassium: 3.9 mmol/L (ref 3.5–5.1)
Sodium: 134 mmol/L — ABNORMAL LOW (ref 135–145)
Total Bilirubin: 0.7 mg/dL (ref 0.0–1.2)
Total Protein: 7.1 g/dL (ref 6.5–8.1)

## 2023-12-03 MED ORDER — SODIUM CHLORIDE 0.9 % IV SOLN: Freq: Once | INTRAVENOUS | Status: AC

## 2023-12-03 MED ORDER — SODIUM CHLORIDE 0.9% FLUSH
10.0000 mL | INTRAVENOUS | Status: DC | PRN
  Administered 2023-12-03: 10 mL via INTRAVENOUS

## 2023-12-03 MED ORDER — HEPARIN SOD (PORK) LOCK FLUSH 100 UNIT/ML IV SOLN
500.0000 [IU] | Freq: Once | INTRAVENOUS | Status: AC | PRN
  Administered 2023-12-03: 500 [IU]

## 2023-12-03 MED ORDER — GABAPENTIN 100 MG PO CAPS: 100.0000 mg | ORAL_CAPSULE | Freq: Two times a day (BID) | ORAL | 2 refills | Status: DC

## 2023-12-03 MED ORDER — SODIUM CHLORIDE 0.9 % IV SOLN
1500.0000 mg | Freq: Once | INTRAVENOUS | Status: AC
  Administered 2023-12-03: 1500 mg via INTRAVENOUS
  Filled 2023-12-03: qty 30

## 2023-12-03 MED ORDER — SODIUM CHLORIDE 0.9% FLUSH
10.0000 mL | INTRAVENOUS | Status: DC | PRN
  Administered 2023-12-03: 10 mL

## 2023-12-03 NOTE — Progress Notes (Signed)
 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   PRIOR THERAPY: None   CURRENT THERAPY: Palliative systemic chemotherapy with carboplatin for AUC of 5 on day 1, etoposide 100 mg/M2 on days 1, 2 and 3 as well as Imfinzi 1500 mg every 3 weeks with the chemotherapy and Neulasta support. First dose on 03/16/2019. Status post 62 cycles.   Starting from cycle #5 the patient will be treated with maintenance treatment with Imfinzi 1500 mg IV every 4 weeks.  INTERVAL HISTORY: Brittany Marks 83 y.o. female returns to clinic today for follow-up visit.Discussed the use of AI scribe software for clinical note transcription with the patient, who gave verbal consent to proceed.  History of Present Illness   Brittany Marks "Brittany Marks" is an 83 year old female with small cell lung cancer who presents for cycle number sixty-three of immunotherapy treatment.  She has a history of small cell lung cancer diagnosed in May 2020. Initially, she underwent chemotherapy and immunotherapy with carboplatin, etoposide, and durvalumab (Imfinzi). After completing four cycles, she continued with durvalumab alone every four weeks. She has completed sixty-two cycles and is here for cycle number sixty-three.  She is on supplemental oxygen, although she is not using it at the moment. She acknowledges the need to use oxygen continuously, especially when going out. No shortness of breath.  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 She requests a prescription for medication to manage this,  which she believes might be gabapentin. She uses [facility] and [facility] for her prescriptions.        MEDICAL HISTORY: Past Medical History:  Diagnosis Date   Cataract    Bilateral   Constipation    pain medication   Left rotator cuff tear    Right rotator cuff tear    SCL CA dx'd 11/2018    ALLERGIES:  is allergic to oxycodone.  MEDICATIONS:  Current Outpatient Medications  Medication Sig Dispense Refill   albuterol (VENTOLIN HFA) 108 (90 Base) MCG/ACT inhaler Inhale 2 puffs into the lungs every 6 (six) hours as needed for wheezing or shortness of breath. 8 g 2   Ascorbic Acid (VITAMIN C PO) Take by mouth daily.      b complex vitamins tablet Take 1 tablet by mouth daily.     COD LIVER OIL PO Take 1 tablet by mouth daily.      gabapentin (NEURONTIN) 100 MG capsule Take 1 capsule (100 mg total) by mouth 2 (two) times daily. 90 capsule 2   hydrocortisone 2.5 % cream Apply topically as needed. (Patient taking differently: Apply 1 application  topically daily as needed (Treatment).) 3.5 g 0   ibuprofen (ADVIL) 200 MG tablet Take 600 mg by mouth daily.     lidocaine-prilocaine (EMLA) cream Apply 1 Application topically every 30 (thirty) days. 30 g 2   Multiple Vitamin (MULTIVITAMIN) tablet Take 1 tablet by mouth daily.     ondansetron (ZOFRAN) 4 MG tablet Take 1 tablet (4 mg total) by mouth every 8 (eight) hours as needed for nausea or vomiting. 10 tablet 0  oxymetazoline (AFRIN) 0.05 % nasal spray Place 2 sprays into the nose 2 (two) times daily as needed (Bloody nose).     prochlorperazine (COMPAZINE) 10 MG tablet Take 1 tablet (10 mg total) by mouth every 6 (six) hours as needed for nausea or vomiting. 30 tablet 0   VITAMIN E PO Take 1 tablet by mouth daily.      No current facility-administered medications for this visit.   Facility-Administered Medications Ordered in Other Visits  Medication Dose Route Frequency Provider Last Rate Last Admin   heparin lock flush  100 unit/mL  500 Units Intracatheter Once PRN Magrinat, Valentino Hue, MD       sodium chloride flush (NS) 0.9 % injection 10 mL  10 mL Intracatheter PRN Magrinat, Valentino Hue, MD        SURGICAL HISTORY:  Past Surgical History:  Procedure Laterality Date   CATARACT EXTRACTION W/ INTRAOCULAR LENS  IMPLANT, BILATERAL  2012   DECOMPRESSIVE LUMBAR LAMINECTOMY LEVEL 2 N/A 07/29/2013   Procedure: LUMBAR LAMINECTOMY, DECOMPRESSION LUMBAR THREE TO FOUR, FOUR TO FIVE microdiscectomy l3,4 right;  Surgeon: Jacki Cones, MD;  Location: [facility];  Service: Orthopedics;  Laterality: N/A;   I & D SUPERIOR RIGHT SHOULDER AND CLOSURE WOUND  01-10-2011   S/P ROTATOR CUFF REPAIR   IR IMAGING GUIDED PORT INSERTION  04/09/2019   LUMBAR LAMINECTOMY  1970'S   LUMBAR LAMINECTOMY/DECOMPRESSION MICRODISCECTOMY N/A 06/27/2016   Procedure: L1 - L2 DISCECTOMY;  Surgeon: Venita Lick, MD;  Location: [facility];  Service: Orthopedics;  Laterality: N/A;   LUMBAR SPINE SURGERY  1983   REVERSE SHOULDER ARTHROPLASTY Left 03/10/2020   Procedure: REVERSE SHOULDER ARTHROPLASTY;  Surgeon: Francena Hanly, MD;  Location: [facility];  Service: Orthopedics;  Laterality: Left;    RIGHT SHOULDER ARTHROSCOPY/ OPEN DISTAL CLAVICLE RESECTION/ SAD/ OPEN ROTATOR CUFF REPAIR  11-28-2010   SHOULDER FUSION SURGERY  03/11/2020   Shoulder Surgery , hardware placed   SHOULDER OPEN ROTATOR CUFF REPAIR  12/19/2011   Procedure: ROTATOR CUFF REPAIR SHOULDER OPEN;  Surgeon: Drucilla Schmidt, MD;  Location: [facility];  Service: Orthopedics;  Laterality: Right;  RIGHT RECURRENT OPEN REPAIR OF THE ROTATOR CUFF WITH TISSUE MEND GRAFTANTERIOR CHROMIOECTOMY   VAGINAL HYSTERECTOMY  1979    REVIEW OF SYSTEMS:  A comprehensive review of systems was negative except for: Constitutional: positive for fatigue Respiratory: positive for dyspnea on exertion Musculoskeletal: positive for arthralgias and muscle weakness   PHYSICAL EXAMINATION: General  appearance: alert, cooperative, fatigued, and no distress Head: Normocephalic, without obvious abnormality, atraumatic Neck: no adenopathy, no JVD, supple, symmetrical, trachea midline, and thyroid not enlarged, symmetric, no tenderness/mass/nodules Lymph nodes: Cervical, supraclavicular, and axillary nodes normal. Resp: clear to auscultation bilaterally Back: symmetric, no curvature. ROM normal. No CVA tenderness. Cardio: regular rate and rhythm, S1, S2 normal, no murmur, click, rub or gallop GI: soft, non-tender; bowel sounds normal; no masses,  no organomegaly Extremities: extremities normal, atraumatic, no cyanosis or edema   ECOG PERFORMANCE STATUS: 1 - Symptomatic but completely ambulatory  Blood pressure 132/85, pulse 75, temperature 97.7 F (36.5 C), temperature source Temporal, resp. rate 15, height 5\' 2"  (1.575 m), weight 140 lb 4.8 oz (63.6 kg), SpO2 (!) 85%.  LABORATORY DATA: Lab Results  Component Value Date   WBC 8.8 12/03/2023   HGB 14.0 12/03/2023   HCT 40.4 12/03/2023   MCV 94.6 12/03/2023   PLT 236 12/03/2023      Chemistry      Component  Value Date/Time   NA 130 (L) 11/05/2023 1316   K 3.7 11/05/2023 1316   CL 97 (L) 11/05/2023 1316   CO2 27 11/05/2023 1316   BUN 19 11/05/2023 1316   CREATININE 0.67 11/05/2023 1316      Component Value Date/Time   CALCIUM 9.5 11/05/2023 1316   ALKPHOS 73 11/05/2023 1316   AST 20 11/05/2023 1316   ALT 12 11/05/2023 1316   BILITOT 0.5 11/05/2023 1316       RADIOGRAPHIC STUDIES: No results found.   ASSESSMENT AND PLAN: This is a very pleasant 83 years old white female with extensive stage small cell lung cancer and she is currently undergoing treatment with carboplatin, etoposide and Imfinzi status post 5 cycles.  Starting from cycle #6 the patient is on maintenance treatment with single agent Imfinzi every 4 weeks status post 62 cycles of total treatment. Assessment and Plan    Small Cell Lung Cancer Diagnosed in  May 2020. Currently on durvalumab (Imfinzi) every 4 weeks. Completed 62 cycles, here for cycle 63. Previous treatment included 4 cycles of carboplatin, etoposide, and durvalumab. No new symptoms. Plan to delay next scan until after the next cycle per patient's preference to avoid frequent scans. - Administer cycle 63 of durvalumab today - Schedule follow-up visit in 4 weeks - Plan for imaging after the next cycle  Hypoxemia Oxygen saturation at 87%. Not using prescribed supplemental oxygen. Emphasized importance of maintaining adequate saturation levels. Patient prefers to avoid using oxygen in public. - Ensure patient uses supplemental oxygen while in the clinic and until she gets home - Reinforce the need for continuous use of supplemental oxygen during activities  Peripheral Neuropathy Affecting the hands. Requests medication for symptom management. Previously prescribed gabapentin. - Send prescription for gabapentin to [facility] and [facility].   The patient was advised to call immediately if she has any concerning symptoms in the interval. The patient voices understanding of current disease status and treatment options and is in agreement with the current care plan.  All questions were answered. The patient knows to call the clinic with any problems, questions or concerns. We can certainly see the patient much sooner if necessary. The total time spent in the appointment was 20 minutes.  Disclaimer: This note was dictated with voice recognition software. Similar sounding words can inadvertently be transcribed and may not be corrected upon review.

## 2023-12-03 NOTE — Patient Instructions (Signed)
 [facility] - A DEPT OF [facility]  Discharge Instructions: Thank you for choosing [facility] to provide your oncology and hematology care.   If you have a lab appointment with the Cancer Center, please go directly to the Cancer Center and check in at the registration area.   Wear comfortable clothing and clothing appropriate for easy access to any Portacath or PICC line.   We strive to give you quality time with your provider. You may need to reschedule your appointment if you arrive late (15 or more minutes).  Arriving late affects you and other patients whose appointments are after yours.  Also, if you miss three or more appointments without notifying the office, you may be dismissed from the clinic at the provider's discretion.      For prescription refill requests, have your pharmacy contact our office and allow 72 hours for refills to be completed.    Today you received the following chemotherapy and/or immunotherapy agents: Durvalumab       To help prevent nausea and vomiting after your treatment, we encourage you to take your nausea medication as directed.  BELOW ARE SYMPTOMS THAT SHOULD BE REPORTED IMMEDIATELY: *FEVER GREATER THAN 100.4 F (38 C) OR HIGHER *CHILLS OR SWEATING *NAUSEA AND VOMITING THAT IS NOT CONTROLLED WITH YOUR NAUSEA MEDICATION *UNUSUAL SHORTNESS OF BREATH *UNUSUAL BRUISING OR BLEEDING *URINARY PROBLEMS (pain or burning when urinating, or frequent urination) *BOWEL PROBLEMS (unusual diarrhea, constipation, pain near the anus) TENDERNESS IN MOUTH AND THROAT WITH OR WITHOUT PRESENCE OF ULCERS (sore throat, sores in mouth, or a toothache) UNUSUAL RASH, SWELLING OR PAIN  UNUSUAL VAGINAL DISCHARGE OR ITCHING   Items with * indicate a potential emergency and should be followed up as soon as possible or go to the Emergency Department if any problems should occur.  Please show the CHEMOTHERAPY ALERT CARD or  IMMUNOTHERAPY ALERT CARD at check-in to the Emergency Department and triage nurse.  Should you have questions after your visit or need to cancel or reschedule your appointment, please contact [facility] - A DEPT OF [facility]  Dept: [phone]  and follow the prompts.  Office hours are 8:00 a.m. to 4:30 p.m. Monday - Friday. Please note that voicemails left after 4:00 p.m. may not be returned until the following business day.  We are closed weekends and major holidays. You have access to a nurse at all times for urgent questions. Please call the main number to the clinic Dept: [phone] and follow the prompts.   For any non-urgent questions, you may also contact your provider using MyChart. We now offer e-Visits for anyone 18 and older to request care online for non-urgent symptoms. For details visit [email or web address].   Also download the MyChart app! Go to the app store, search "MyChart", open the app, select [facility], and log in with your MyChart username and password.

## 2023-12-07 ENCOUNTER — Emergency Department (HOSPITAL_COMMUNITY)

## 2023-12-07 ENCOUNTER — Encounter (HOSPITAL_COMMUNITY): Payer: Self-pay | Admitting: Family Medicine

## 2023-12-07 ENCOUNTER — Other Ambulatory Visit: Payer: Self-pay

## 2023-12-07 ENCOUNTER — Inpatient Hospital Stay (HOSPITAL_COMMUNITY)
Admission: EM | Admit: 2023-12-07 | Discharge: 2023-12-12 | DRG: 065 | Disposition: A | Discharge status: Skilled Nursing Facility | Attending: Internal Medicine | Admitting: Internal Medicine

## 2023-12-07 DIAGNOSIS — I6523 Occlusion and stenosis of bilateral carotid arteries: Secondary | ICD-10-CM | POA: Diagnosis not present

## 2023-12-07 DIAGNOSIS — M48062 Spinal stenosis, lumbar region with neurogenic claudication: Secondary | ICD-10-CM | POA: Diagnosis present

## 2023-12-07 DIAGNOSIS — E860 Dehydration: Secondary | ICD-10-CM | POA: Diagnosis present

## 2023-12-07 DIAGNOSIS — E785 Hyperlipidemia, unspecified: Secondary | ICD-10-CM | POA: Diagnosis present

## 2023-12-07 DIAGNOSIS — Z8249 Family history of ischemic heart disease and other diseases of the circulatory system: Secondary | ICD-10-CM

## 2023-12-07 DIAGNOSIS — I6389 Other cerebral infarction: Secondary | ICD-10-CM | POA: Diagnosis not present

## 2023-12-07 DIAGNOSIS — C3411 Malignant neoplasm of upper lobe, right bronchus or lung: Secondary | ICD-10-CM | POA: Diagnosis present

## 2023-12-07 DIAGNOSIS — R296 Repeated falls: Secondary | ICD-10-CM | POA: Diagnosis present

## 2023-12-07 DIAGNOSIS — J449 Chronic obstructive pulmonary disease, unspecified: Secondary | ICD-10-CM | POA: Diagnosis present

## 2023-12-07 DIAGNOSIS — T451X5A Adverse effect of antineoplastic and immunosuppressive drugs, initial encounter: Secondary | ICD-10-CM | POA: Diagnosis present

## 2023-12-07 DIAGNOSIS — Z7982 Long term (current) use of aspirin: Secondary | ICD-10-CM

## 2023-12-07 DIAGNOSIS — S42301A Unspecified fracture of shaft of humerus, right arm, initial encounter for closed fracture: Secondary | ICD-10-CM

## 2023-12-07 DIAGNOSIS — I639 Cerebral infarction, unspecified: Secondary | ICD-10-CM | POA: Diagnosis present

## 2023-12-07 DIAGNOSIS — Z79899 Other long term (current) drug therapy: Secondary | ICD-10-CM

## 2023-12-07 DIAGNOSIS — W010XXA Fall on same level from slipping, tripping and stumbling without subsequent striking against object, initial encounter: Secondary | ICD-10-CM | POA: Diagnosis present

## 2023-12-07 DIAGNOSIS — Z96612 Presence of left artificial shoulder joint: Secondary | ICD-10-CM | POA: Diagnosis present

## 2023-12-07 DIAGNOSIS — S42294A Other nondisplaced fracture of upper end of right humerus, initial encounter for closed fracture: Secondary | ICD-10-CM | POA: Diagnosis present

## 2023-12-07 DIAGNOSIS — Z9981 Dependence on supplemental oxygen: Secondary | ICD-10-CM | POA: Diagnosis not present

## 2023-12-07 DIAGNOSIS — Z66 Do not resuscitate: Secondary | ICD-10-CM | POA: Diagnosis present

## 2023-12-07 DIAGNOSIS — I63422 Cerebral infarction due to embolism of left anterior cerebral artery: Secondary | ICD-10-CM | POA: Diagnosis present

## 2023-12-07 DIAGNOSIS — Z90710 Acquired absence of both cervix and uterus: Secondary | ICD-10-CM | POA: Diagnosis not present

## 2023-12-07 DIAGNOSIS — Z720 Tobacco use: Secondary | ICD-10-CM

## 2023-12-07 DIAGNOSIS — C7951 Secondary malignant neoplasm of bone: Secondary | ICD-10-CM | POA: Diagnosis present

## 2023-12-07 DIAGNOSIS — G620 Drug-induced polyneuropathy: Secondary | ICD-10-CM | POA: Diagnosis present

## 2023-12-07 DIAGNOSIS — Z9181 History of falling: Secondary | ICD-10-CM

## 2023-12-07 DIAGNOSIS — I63412 Cerebral infarction due to embolism of left middle cerebral artery: Principal | ICD-10-CM | POA: Diagnosis present

## 2023-12-07 DIAGNOSIS — D72829 Elevated white blood cell count, unspecified: Secondary | ICD-10-CM | POA: Diagnosis present

## 2023-12-07 DIAGNOSIS — I6502 Occlusion and stenosis of left vertebral artery: Secondary | ICD-10-CM | POA: Diagnosis not present

## 2023-12-07 DIAGNOSIS — R9431 Abnormal electrocardiogram [ECG] [EKG]: Secondary | ICD-10-CM | POA: Insufficient documentation

## 2023-12-07 DIAGNOSIS — F17210 Nicotine dependence, cigarettes, uncomplicated: Secondary | ICD-10-CM | POA: Diagnosis present

## 2023-12-07 DIAGNOSIS — Z961 Presence of intraocular lens: Secondary | ICD-10-CM | POA: Diagnosis present

## 2023-12-07 DIAGNOSIS — J9611 Chronic respiratory failure with hypoxia: Secondary | ICD-10-CM | POA: Diagnosis present

## 2023-12-07 DIAGNOSIS — R29705 NIHSS score 5: Secondary | ICD-10-CM | POA: Diagnosis present

## 2023-12-07 DIAGNOSIS — E876 Hypokalemia: Secondary | ICD-10-CM | POA: Diagnosis present

## 2023-12-07 DIAGNOSIS — I959 Hypotension, unspecified: Secondary | ICD-10-CM | POA: Diagnosis present

## 2023-12-07 HISTORY — DX: Cerebral infarction, unspecified: I63.9

## 2023-12-07 LAB — TSH: TSH: 1.847 u[IU]/mL (ref 0.350–4.500)

## 2023-12-07 LAB — HEPATIC FUNCTION PANEL
ALT: 24 U/L (ref 0–44)
AST: 44 U/L — ABNORMAL HIGH (ref 15–41)
Albumin: 3.3 g/dL — ABNORMAL LOW (ref 3.5–5.0)
Alkaline Phosphatase: 68 U/L (ref 38–126)
Bilirubin, Direct: 0.2 mg/dL (ref 0.0–0.2)
Indirect Bilirubin: 0.5 mg/dL (ref 0.3–0.9)
Total Bilirubin: 0.7 mg/dL (ref 0.0–1.2)
Total Protein: 6.7 g/dL (ref 6.5–8.1)

## 2023-12-07 LAB — I-STAT CHEM 8, ED
BUN: 25 mg/dL — ABNORMAL HIGH (ref 8–23)
Calcium, Ion: 1.13 mmol/L — ABNORMAL LOW (ref 1.15–1.40)
Chloride: 100 mmol/L (ref 98–111)
Creatinine, Ser: 1 mg/dL (ref 0.44–1.00)
Glucose, Bld: 123 mg/dL — ABNORMAL HIGH (ref 70–99)
HCT: 41 % (ref 36.0–46.0)
Hemoglobin: 13.9 g/dL (ref 12.0–15.0)
Potassium: 4.2 mmol/L (ref 3.5–5.1)
Sodium: 135 mmol/L (ref 135–145)
TCO2: 25 mmol/L (ref 22–32)

## 2023-12-07 LAB — CBC WITH DIFFERENTIAL/PLATELET
Abs Immature Granulocytes: 0.06 10*3/uL (ref 0.00–0.07)
Basophils Absolute: 0.1 10*3/uL (ref 0.0–0.1)
Basophils Relative: 0 %
Eosinophils Absolute: 0.1 10*3/uL (ref 0.0–0.5)
Eosinophils Relative: 1 %
HCT: 44.7 % (ref 36.0–46.0)
Hemoglobin: 14.5 g/dL (ref 12.0–15.0)
Immature Granulocytes: 0 %
Lymphocytes Relative: 8 %
Lymphs Abs: 1 10*3/uL (ref 0.7–4.0)
MCH: 32.2 pg (ref 26.0–34.0)
MCHC: 32.4 g/dL (ref 30.0–36.0)
MCV: 99.3 fL (ref 80.0–100.0)
Monocytes Absolute: 0.9 10*3/uL (ref 0.1–1.0)
Monocytes Relative: 7 %
Neutro Abs: 11.5 10*3/uL — ABNORMAL HIGH (ref 1.7–7.7)
Neutrophils Relative %: 84 %
Platelets: 264 10*3/uL (ref 150–400)
RBC: 4.5 MIL/uL (ref 3.87–5.11)
RDW: 13.7 % (ref 11.5–15.5)
WBC: 13.7 10*3/uL — ABNORMAL HIGH (ref 4.0–10.5)
nRBC: 0 % (ref 0.0–0.2)

## 2023-12-07 LAB — BASIC METABOLIC PANEL
Anion gap: 13 (ref 5–15)
BUN: 29 mg/dL — ABNORMAL HIGH (ref 8–23)
CO2: 22 mmol/L (ref 22–32)
Calcium: 8.5 mg/dL — ABNORMAL LOW (ref 8.9–10.3)
Chloride: 97 mmol/L — ABNORMAL LOW (ref 98–111)
Creatinine, Ser: 0.94 mg/dL (ref 0.44–1.00)
GFR, Estimated: >60 mL/min (ref >60)
Glucose, Bld: 115 mg/dL — ABNORMAL HIGH (ref 70–99)
Potassium: 3.1 mmol/L — ABNORMAL LOW (ref 3.5–5.1)
Sodium: 132 mmol/L — ABNORMAL LOW (ref 135–145)

## 2023-12-07 LAB — URINALYSIS, W/ REFLEX TO CULTURE (INFECTION SUSPECTED)
Bilirubin Urine: NEGATIVE
Cellular Cast, UA: 1
Glucose, UA: NEGATIVE mg/dL
Hgb urine dipstick: NEGATIVE
Ketones, ur: NEGATIVE mg/dL
Nitrite: NEGATIVE
Protein, ur: NEGATIVE mg/dL
Specific Gravity, Urine: 1.023 (ref 1.005–1.030)
pH: 5 (ref 5.0–8.0)

## 2023-12-07 LAB — RAPID URINE DRUG SCREEN, HOSP PERFORMED
Amphetamines: NOT DETECTED
Barbiturates: NOT DETECTED
Benzodiazepines: NOT DETECTED
Cocaine: NOT DETECTED
Opiates: POSITIVE — AB
Tetrahydrocannabinol: NOT DETECTED

## 2023-12-07 LAB — APTT: aPTT: 28 s (ref 24–36)

## 2023-12-07 LAB — VITAMIN B12: Vitamin B-12: 820 pg/mL (ref 180–914)

## 2023-12-07 LAB — MAGNESIUM: Magnesium: 2 mg/dL (ref 1.7–2.4)

## 2023-12-07 LAB — PROTIME-INR
INR: 1.2 (ref 0.8–1.2)
Prothrombin Time: 15.7 s — ABNORMAL HIGH (ref 11.4–15.2)

## 2023-12-07 LAB — ETHANOL: Alcohol, Ethyl (B): <10 mg/dL (ref <10)

## 2023-12-07 MED ORDER — ACETAMINOPHEN 325 MG PO TABS
650.0000 mg | ORAL_TABLET | ORAL | Status: DC | PRN
  Administered 2023-12-09 – 2023-12-10 (×2): 650 mg via ORAL
  Filled 2023-12-07 (×2): qty 2

## 2023-12-07 MED ORDER — MORPHINE SULFATE (PF) 2 MG/ML IV SOLN
2.0000 mg | Freq: Once | INTRAVENOUS | Status: AC
  Administered 2023-12-07: 2 mg via INTRAVENOUS
  Filled 2023-12-07: qty 1

## 2023-12-07 MED ORDER — NICOTINE 14 MG/24HR TD PT24
14.0000 mg | MEDICATED_PATCH | Freq: Every day | TRANSDERMAL | Status: DC
  Administered 2023-12-07 – 2023-12-12 (×6): 14 mg via TRANSDERMAL
  Filled 2023-12-07 (×6): qty 1

## 2023-12-07 MED ORDER — SENNOSIDES-DOCUSATE SODIUM 8.6-50 MG PO TABS: 1.0000 | ORAL_TABLET | Freq: Every evening | ORAL | Status: DC | PRN

## 2023-12-07 MED ORDER — GADOBUTROL 1 MMOL/ML IV SOLN
6.0000 mL | Freq: Once | INTRAVENOUS | Status: AC | PRN
  Administered 2023-12-07: 6 mL via INTRAVENOUS

## 2023-12-07 MED ORDER — ACETAMINOPHEN 650 MG RE SUPP: 650.0000 mg | RECTAL | Status: DC | PRN

## 2023-12-07 MED ORDER — IOHEXOL 350 MG/ML SOLN
100.0000 mL | Freq: Once | INTRAVENOUS | Status: AC | PRN
  Administered 2023-12-07: 75 mL via INTRAVENOUS

## 2023-12-07 MED ORDER — ALBUTEROL SULFATE (2.5 MG/3ML) 0.083% IN NEBU: 2.5000 mg | INHALATION_SOLUTION | Freq: Four times a day (QID) | RESPIRATORY_TRACT | Status: DC | PRN

## 2023-12-07 MED ORDER — ACETAMINOPHEN 160 MG/5ML PO SOLN: 650.0000 mg | ORAL | Status: DC | PRN

## 2023-12-07 MED ORDER — ALBUTEROL SULFATE HFA 108 (90 BASE) MCG/ACT IN AERS: 2.0000 | INHALATION_SPRAY | Freq: Four times a day (QID) | RESPIRATORY_TRACT | Status: DC | PRN

## 2023-12-07 MED ORDER — SODIUM CHLORIDE 0.9% FLUSH: 3.0000 mL | INTRAVENOUS | Status: DC | PRN

## 2023-12-07 MED ORDER — GABAPENTIN 100 MG PO CAPS
100.0000 mg | ORAL_CAPSULE | Freq: Two times a day (BID) | ORAL | Status: DC
  Administered 2023-12-08 – 2023-12-12 (×9): 100 mg via ORAL
  Filled 2023-12-07 (×10): qty 1

## 2023-12-07 MED ORDER — SODIUM CHLORIDE 0.9% FLUSH
3.0000 mL | Freq: Two times a day (BID) | INTRAVENOUS | Status: DC
  Administered 2023-12-07 – 2023-12-10 (×5): 10 mL via INTRAVENOUS
  Administered 2023-12-10 – 2023-12-11 (×2): 3 mL via INTRAVENOUS

## 2023-12-07 MED ORDER — POTASSIUM CHLORIDE CRYS ER 20 MEQ PO TBCR
80.0000 meq | EXTENDED_RELEASE_TABLET | Freq: Once | ORAL | Status: AC
  Administered 2023-12-07: 80 meq via ORAL
  Filled 2023-12-07: qty 4

## 2023-12-07 MED ORDER — SODIUM CHLORIDE 0.9 % IV BOLUS
1000.0000 mL | Freq: Once | INTRAVENOUS | Status: AC
  Administered 2023-12-07: 1000 mL via INTRAVENOUS

## 2023-12-07 MED ORDER — ENOXAPARIN SODIUM 40 MG/0.4ML IJ SOSY
40.0000 mg | PREFILLED_SYRINGE | Freq: Every day | INTRAMUSCULAR | Status: DC
  Administered 2023-12-07 – 2023-12-11 (×5): 40 mg via SUBCUTANEOUS
  Filled 2023-12-07 (×5): qty 0.4

## 2023-12-07 MED ORDER — MORPHINE SULFATE (PF) 2 MG/ML IV SOLN: 1.0000 mg | INTRAVENOUS | Status: DC | PRN

## 2023-12-07 MED ORDER — ADULT MULTIVITAMIN W/MINERALS CH
1.0000 | ORAL_TABLET | Freq: Every day | ORAL | Status: DC
  Administered 2023-12-08 – 2023-12-12 (×5): 1 via ORAL
  Filled 2023-12-07 (×5): qty 1

## 2023-12-07 MED ORDER — STROKE: EARLY STAGES OF RECOVERY BOOK
Freq: Once | Status: AC
  Administered 2023-12-08: 1
  Filled 2023-12-07: qty 1

## 2023-12-07 NOTE — ED Provider Notes (Signed)
 Patient is a handoff from [employer], [place]. Pending orthopedics consultation for right humeral fracture. Patient already admitted to hospitalist to [facility]. Neurovascularly intact. Physical Exam  BP (!) 124/103 (BP Location: Left Arm)   Pulse 79   Temp 98.2 F (36.8 C) (Oral)   Resp 16   Ht 5\' 2"  (1.575 m)   Wt 63.6 kg   SpO2 96%   BMI 25.66 kg/m   Physical Exam Vitals and nursing note reviewed.  Constitutional:      General: She is not in acute distress.    Appearance: She is well-developed.  HENT:     Head: Normocephalic and atraumatic.  Eyes:     Conjunctiva/sclera: Conjunctivae normal.  Cardiovascular:     Rate and Rhythm: Normal rate and regular rhythm.     Heart sounds: No murmur heard. Pulmonary:     Effort: Pulmonary effort is normal. No respiratory distress.     Breath sounds: Normal breath sounds.  Abdominal:     Palpations: Abdomen is soft.     Tenderness: There is no abdominal tenderness.  Musculoskeletal:        General: No swelling.     Cervical back: Neck supple.     Comments: Deformity to the right anterior proximal humerus with tenderness to palpation. Tenderness to right shoulder with yellow-colored bruise present. Shoulder ROM impaired second to pain. No right elbow, forearm, wrist, or hand tenderness or deformity. Palpable radial pulse. Grip strength intact. No tenderness or injuries     Skin:    General: Skin is warm and dry.     Capillary Refill: Capillary refill takes less than 2 seconds.  Neurological:     Mental Status: She is alert.  Psychiatric:        Mood and Affect: Mood normal.     Procedures  Procedures  ED Course / MDM    Medical Decision Making Amount and/or Complexity of Data Reviewed Labs: ordered. Radiology: ordered.  Risk Prescription drug management. Decision regarding hospitalization.   Patient is a handoff from [employer], [place]. Pending orthopedics consultation for right humeral fracture. Patient already admitted by  hospitalist to [facility]. Neurovascularly intact.  Dr. Iver Nestle with neurology consulted given concerning findings of acute infarcts on MRI. Advised transfer to [facility].  Patient refusing admission to [facility]. Dr. Artis Flock informed me patient only wanting admission to [facility]. Dr. Iver Nestle informed and feels that some monitoring and evaluation at [facility] is better than her leaving AMA so she is comfortable with that for now.  Spoke with [name], orthopedics, who did not advise any emergent intervention. Shoulder sling for immobilization and plan for outpatient follow up with [name] in clinic in 5-7 days. Informed Dr. Artis Flock, hospitalist, of these recommendations.      Smitty Knudsen, PA-C 12/07/23 1747    Derwood Kaplan, MD 12/07/23 1911

## 2023-12-07 NOTE — Assessment & Plan Note (Addendum)
 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

## 2023-12-07 NOTE — ED Notes (Signed)
 Upon triage patient sat was 80s. Patient stated she uses 3l o2 at baseline,   Asked why it was not on, patient says they left it at house.  This nurse applied 3l o2 via [place] Sat increased to 100%

## 2023-12-07 NOTE — Progress Notes (Signed)
 Orthopedic Tech Progress Note Patient Details:  Brittany Marks [birth year] [id number]  Ortho Devices Type of Ortho Device: Coapt Ortho Device/Splint Location: RUE Ortho Device/Splint Interventions: Ordered   Post Interventions Patient Tolerated: Well Instructions Provided: Care of device  Tonye Pearson 12/07/2023, 11:41 AM

## 2023-12-07 NOTE — Plan of Care (Signed)
 Discussed with EDP CTA head and neck for vessel imaging  Admit to [facility] for stroke workup  Notify neurology on arrival to [facility]

## 2023-12-07 NOTE — Assessment & Plan Note (Addendum)
 S/p fall she has an acute, obliquely oriented fracture deformity of the proximal shaft of the right humerus In splint, ortho consulted.  Recommend no acute intervention. Continue sling, f/[facility] with [facility] Jun[facility] in 5-7 days. NWB Limited use of arms, OT to see

## 2023-12-07 NOTE — Assessment & Plan Note (Addendum)
 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 She is refusing to go to [facility] and will leave AMA if admitted there. Have admitted her at [facility] and discussed with neurology, they are aware she is here and refusing to go over there.  -neurology consulted  -Neurochecks per protocol -CTA head/neck pending  -echo -lipid panel and A1C  -AC per neurology, will decide when they see her.  -Permissive hypertension first 24 hours <220/110 -N.p.o. until bedside swallow screen-passed.  -PT/ OT/ SLP consult

## 2023-12-07 NOTE — Assessment & Plan Note (Signed)
 4 falls in last day, states accident, but likely secondary to acute CVAs  Stroke w/u per #1 Check TSH/B12 PT to see, lives alone

## 2023-12-07 NOTE — Assessment & Plan Note (Signed)
 Nicotine patch  No interest in stopping smoking

## 2023-12-07 NOTE — Progress Notes (Signed)
 Consult received from EDP for R proximal humerus shaft fx. Admitted for CVA. Plan for sling, NWB RUE. F/u with [name] within 1 week after d/c. Call [phone] to schedule.

## 2023-12-07 NOTE — Assessment & Plan Note (Signed)
 Continue low dose gabapentin BID

## 2023-12-07 NOTE — Assessment & Plan Note (Signed)
 Check magnesium Repleted in ED Continue tele and trend

## 2023-12-07 NOTE — H&P (Signed)
 History and Physical    Patient: Brittany Marks XBJ:[id number] DOB: [birth year] DOA: 12/07/2023 DOS: the patient was seen and examined on 12/07/2023 PCP: Pcp, No  Patient coming from: Home - lives alone. Uses cane and walker    Chief Complaint: recurrent falls   HPI: Brittany Marks is a 83 y.o. 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 She had fallen 4 times yesterday. She states on the last fall she had sat down on her walker and it slipped out from underneath her and she fell forward on an outstretched arm. She states this prompted her to come to ED. She denies any  headaches, vision changes, weakness, numbness, focal deficits, chest pain or palpitations. She states the other falls were also accidents.   He has been feeling good. Denies any fever/chills, vision changes/headaches, chest pain or palpitations, shortness of breath or cough, abdominal pain, N/V/D, dysuria or leg swelling.   She smokes 5-15 cig/day. She does not drink alcohol.    ER Course:  vitals: afebrile, bp: 166/95, HR: 86, RR: 16, oxygen: 85% on RA Pertinent labs: WBC: 13.7, potassium: 3.1, BUN: 29 (all other labs pending)  MRI brain: small acute infarcts scattered in left ACA and MCA distribution   Right humerus xray: acute, obliquely oriented, fx deformity involving the proximal shaft of the right humerus Right shoulder xray: above, OA of glenohumeral joint  CTA head/neck pending In ED: neurology consulted. Given 1L IVF bolus, of potassium and TRH asked to admit.    Review of Systems: As mentioned in the history of present illness. All other systems reviewed and are negative. Past Medical History:  Diagnosis Date   Acute CVA (cerebrovascular accident) (HCC) 12/07/2023   Cataract     Bilateral   Constipation    pain medication   Left rotator cuff tear    Right rotator cuff tear    SCL CA dx'd 11/2018   Past Surgical History:  Procedure Laterality Date   CATARACT EXTRACTION W/ INTRAOCULAR LENS  IMPLANT, BILATERAL  2012   DECOMPRESSIVE LUMBAR LAMINECTOMY LEVEL 2 N/A 07/29/2013   Procedure: LUMBAR LAMINECTOMY, DECOMPRESSION LUMBAR THREE TO FOUR, FOUR TO FIVE microdiscectomy l3,4 right;  Surgeon: Jacki Cones, MD;  Location: [facility];  Service: Orthopedics;  Laterality: N/A;   I & D SUPERIOR RIGHT SHOULDER AND CLOSURE WOUND  01-10-2011   S/P ROTATOR CUFF REPAIR   IR IMAGING GUIDED PORT INSERTION  04/09/2019   LUMBAR LAMINECTOMY  1970'S   LUMBAR LAMINECTOMY/DECOMPRESSION MICRODISCECTOMY N/A 06/27/2016   Procedure: L1 - L2 DISCECTOMY;  Surgeon: Venita Lick, MD;  Location: [facility];  Service: Orthopedics;  Laterality: N/A;   LUMBAR SPINE SURGERY  1983   REVERSE SHOULDER ARTHROPLASTY Left 03/10/2020   Procedure: REVERSE SHOULDER ARTHROPLASTY;  Surgeon: Francena Hanly, MD;  Location: [facility];  Service: Orthopedics;  Laterality: Left;    RIGHT SHOULDER ARTHROSCOPY/ OPEN DISTAL CLAVICLE RESECTION/ SAD/ OPEN ROTATOR CUFF REPAIR  11-28-2010   SHOULDER FUSION SURGERY  03/11/2020   Shoulder Surgery , hardware placed   SHOULDER OPEN ROTATOR CUFF REPAIR  12/19/2011   Procedure: ROTATOR CUFF REPAIR SHOULDER OPEN;  Surgeon: Drucilla Schmidt, MD;  Location: [facility];  Service: Orthopedics;  Laterality: Right;  RIGHT RECURRENT OPEN REPAIR OF THE  ROTATOR CUFF WITH TISSUE MEND GRAFTANTERIOR CHROMIOECTOMY   VAGINAL HYSTERECTOMY  1979   Social History:  reports that she has been smoking cigarettes. She has a 25 pack-year smoking history. She has never used smokeless tobacco. She reports current alcohol use. She reports that she does not use drugs.  Allergies  Allergen Reactions   Oxycodone Other (See Comments)    Confusion/ near fall    Family History  Problem  Relation Age of Onset   Congestive Heart Failure Mother    Congestive Heart Failure Father     Prior to Admission medications   Medication Sig Start Date End Date Taking? Authorizing Provider  albuterol (VENTOLIN HFA) 108 (90 Base) MCG/ACT inhaler Inhale 2 puffs into the lungs every 6 (six) hours as needed for wheezing or shortness of breath. 08/07/22   Heilingoetter, Cassandra L, PA-C  Ascorbic Acid (VITAMIN C PO) Take by mouth daily.     [provider]  b complex vitamins tablet Take 1 tablet by mouth daily.    [provider]  COD LIVER OIL PO Take 1 tablet by mouth daily.     [provider]  gabapentin (NEURONTIN) 100 MG capsule Take 1 capsule (100 mg total) by mouth 2 (two) times daily. 12/03/23   Si Gaul, MD  hydrocortisone 2.5 % cream Apply topically as needed. Patient taking differently: Apply 1 application  topically daily as needed (Treatment). 12/21/19   Heilingoetter, Cassandra L, PA-C  ibuprofen (ADVIL) 200 MG tablet Take 600 mg by mouth daily.    [provider]  lidocaine-prilocaine (EMLA) cream Apply 1 Application topically every 30 (thirty) days. 08/07/22   Heilingoetter, Cassandra L, PA-C  Multiple Vitamin (MULTIVITAMIN) tablet Take 1 tablet by mouth daily.    [provider]  ondansetron (ZOFRAN) 4 MG tablet Take 1 tablet (4 mg total) by mouth every 8 (eight) hours as needed for nausea or vomiting. 03/11/20   Shuford, French Ana, PA-C  oxymetazoline (AFRIN) 0.05 % nasal spray Place 2 sprays into the nose 2 (two) times daily as needed (Bloody nose).    [provider]  prochlorperazine (COMPAZINE) 10 MG tablet Take 1 tablet (10 mg total) by mouth every 6 (six) hours as needed for nausea or vomiting. 03/10/19   Si Gaul, MD  VITAMIN E PO Take 1 tablet by mouth daily.     [provider]    Physical Exam: Vitals:   12/07/23 0920 12/07/23 1515 12/07/23 1600 12/07/23 1734  BP:  113/81 (!) 136/109 (!)  124/103  Pulse:  82 90 79  Resp:  16    Temp:  97.6 F (36.4 C)  98.2 F (36.8 C)  TempSrc:    Oral  SpO2: 100% 93% 99% 96%  Weight:      Height:       General:  Appears calm and comfortable and is in NAD Eyes:  PERRL, EOMI, normal lids, iris ENT:  grossly normal hearing, lips & tongue, mmm; dentures  Neck:  no LAD, masses or thyromegaly; no carotid bruits Cardiovascular:  RRR, no m/r/g. No LE edema.  Respiratory:   CTA bilaterally with no wheezes/rales/rhonchi.  Normal respiratory effort. Abdomen:  soft, NT, ND, NABS Back:   normal alignment, no CVAT Skin:  no rash or induration seen on limited exam. Port in place in RU chest wall.  Musculoskeletal:  grossly normal tone BUE/BLE, good ROM, no bony abnormality. She has very limited ROM in her bilateral UE due to OA/shoulder surgery. Can not lift  arms, baseline.  Lower extremity:  No LE edema.  Limited foot exam with no ulcerations.  2+ distal pulses. Psychiatric:  grossly normal mood and affect, speech fluent and appropriate, AOx3 Neurologic:  CN 2-12 grossly intact, moves all extremities in coordinated fashion, sensation intact. HTK intact bilaterally. FTN limited, but can do with left UE. Right UE in cast.    Radiological Exams on Admission: Independently reviewed - see discussion in A/P where applicable  CT ANGIO HEAD NECK W WO CM Result Date: 12/07/2023 CLINICAL DATA:  Acute left ACA and left caudate infarcts. Small cell lung cancer. EXAM: CT ANGIOGRAPHY HEAD AND NECK WITH AND WITHOUT CONTRAST TECHNIQUE: Multidetector CT imaging of the head and neck was performed using the standard protocol during bolus administration of intravenous contrast. Multiplanar CT image reconstructions and MIPs were obtained to evaluate the vascular anatomy. Carotid stenosis measurements (when applicable) are obtained utilizing NASCET criteria, using the distal internal carotid diameter as the denominator. RADIATION DOSE REDUCTION: This exam was performed  according to the departmental dose-optimization program which includes automated exposure control, adjustment of the mA and/or kV according to patient size and/or use of iterative reconstruction technique. CONTRAST:  75mL OMNIPAQUE IOHEXOL 350 MG/ML SOLN COMPARISON:  MR head without and with contrast 12/07/2023. CT head without and with contrast 06/16/2019 FINDINGS: CT HEAD FINDINGS Brain: The acute ACA territory infarcts are below the resolution of CT. Advanced white matter disease is again noted. Remote left basal ganglia infarct is present. Deep gray nuclei are otherwise within normal limits. The ventricles are proportionate to the degree of atrophy. No significant extraaxial fluid collection is present. The brainstem and cerebellum are within normal limits. Midline structures are within normal limits. Vascular: Atherosclerotic calcifications are present within the cavernous internal carotid arteries bilaterally and at the dural margin of the left vertebral artery. No hyperdense vessel is present. Skull: Calvarium is intact. No focal lytic or blastic lesions are present. No significant extracranial soft tissue lesion is present. Sinuses/Orbits: The paranasal sinuses and mastoid air cells are clear. Bilateral lens replacements are noted. Globes and orbits are otherwise unremarkable. Review of the MIP images confirms the above findings CTA NECK FINDINGS Aortic arch: Extensive atherosclerotic calcifications are present the aortic arch and great vessel origins. High-grade, near occlusive stenosis is present at the proximal left subclavian artery. No dissection or aneurysm is present. Right carotid system: The right common carotid artery demonstrates some mural calcification without focal stenosis. Dense atherosclerotic calcifications are present at the right carotid bifurcation lumen is narrowed to 1 mm. More distal cervical right ICA is within normal limits. Left carotid system: The left common carotid artery  demonstrates extensive mural calcifications without focal stenosis relative to the more distal vessel. Atherosclerotic calcifications are present at the bifurcation and proximal left ICA. Lumen is narrowed to 2 mm. The more distal cervical left ICA is of normal caliber. Mural calcification is present in the vessel just below the skull base without focal stenosis. Vertebral arteries: Right vertebral artery is dominant vessel. High-grade stenosis is present at the origin of the left vertebral artery. Calcifications are present in the left vertebral artery in the proximal left V2 segment without a tandem stenosis. Skeleton: Multilevel degenerative changes are present in the cervical spine. Uncovertebral spurring contributes to moderate foraminal stenosis bilaterally at C5-6. No focal osseous lesions are present. Other neck: The soft tissues of the neck are otherwise unremarkable. Salivary glands are within normal limits. Thyroid is normal. No significant adenopathy is present. No focal mucosal  or submucosal lesions are present. Upper chest: Post radiation changes are again noted in the left upper lobe. Paraseptal emphysematous changes are present bilaterally. Review of the MIP images confirms the above findings CTA HEAD FINDINGS Anterior circulation: Atherosclerotic calcifications are present within the cavernous internal carotid arteries bilaterally without significant stenosis through the ICA termini. The A1 and M1 segments are normal. The left A1 segment is dominant. The anterior communicating artery is patent. MCA bifurcations are normal bilaterally. The ACA and MCA branch vessels are normal bilaterally. Posterior circulation: Atherosclerotic calcifications are present at the dural margin of the left vertebral artery without focal stenosis. The PICA origins are visualized and normal. The vertebrobasilar junction basilar artery is normal. Both posterior cerebral arteries scratched at the superior cerebellar arteries  are patent. Both posterior cerebral arteries originate from the basilar tip. Mild narrowing is present in the mid left P2 segment. Venous sinuses: The dural sinuses are patent. The straight sinus and deep cerebral veins are intact. Cortical veins are within normal limits. No significant vascular malformation is evident. Anatomic variants: None Review of the MIP images confirms the above findings IMPRESSION: 1. The acute left ACA territory infarcts are below the resolution of CT. 2. Advanced white matter disease likely reflects the sequela of chronic microvascular ischemia. 3. Remote left basal ganglia infarct. 4. 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 5. High-grade stenosis at the origin of the left vertebral artery. 6. Severe stenosis at the right carotid bifurcation with luminal narrowing of 1 mm. 7. Mild narrowing in the mid left P2 segment. 8. No other significant proximal stenosis, aneurysm, or branch vessel occlusion within the Circle of Willis. 9. Post radiation changes in the left upper lobe. 10. Multilevel degenerative changes in the cervical spine. 11. Aortic Atherosclerosis (ICD10-I70.0) and Emphysema (ICD10-J43.9). Electronically Signed   By: Marin Roberts M.D.   On: 12/07/2023 17:31   MR BRAIN W WO CONTRAST Result Date: 12/07/2023 CLINICAL DATA:  Metastatic disease evaluation EXAM: MRI HEAD WITHOUT AND WITH CONTRAST TECHNIQUE: Multiplanar, multiecho pulse sequences of the brain and surrounding structures were obtained without and with intravenous contrast. CONTRAST:  6mL GADAVIST GADOBUTROL 1 MMOL/ML IV SOLN COMPARISON:  None Available. FINDINGS: Brain: Small foci of restricted diffusion in the anterior left temporal lobe, left caudate head, and parasagittal left frontal lobe. These span the left MCA and ACA distributions. Extensive chronic small vessel ischemia in the cerebral white matter with confluent FLAIR hyperintensity. Chronic perforator infarct  affecting the left basal ganglia. Small chronic right frontal microhemorrhage Vascular: Major flow voids and vascular enhancements are preserved. Skull and upper cervical spine: Normal marrow signal. Degeneration with C2-3 facet ankylosis. Sinuses/Orbits: Negative IMPRESSION: Small acute infarcts scattered in the left ACA and MCA distribution. Advanced chronic small vessel disease. Electronically Signed   By: Tiburcio Pea M.D.   On: 12/07/2023 12:38   DG Humerus Right Result Date: 12/07/2023 CLINICAL DATA:  Status post fall.  Decreased mobility. EXAM: RIGHT HUMERUS - 2+ VIEW COMPARISON:  None Available. FINDINGS: There is an acute, obliquely oriented, fracture deformity involving the proximal shaft of the right humerus. There is lateral and posterior angulation of the distal fracture fragments. Advanced arthro pathic changes involving the right glenohumeral joint. IMPRESSION: Acute, obliquely oriented, fracture deformity involving the proximal shaft of the right humerus. Electronically Signed   By: Signa Kell M.D.   On: 12/07/2023 10:26   DG Shoulder Right Result Date: 12/07/2023 CLINICAL DATA:  Fall.  Pain and decreased mobility.  EXAM: RIGHT SHOULDER - 2+ VIEW COMPARISON:  None Available. FINDINGS: There is and acute, obliquely oriented fracture deformity involving the proximal shaft of the right humerus. There is posterior and lateral angulation of the distal fracture fragments. Advanced arthro pathic changes are noted involving the glenohumeral joint. Foreshortening of the right clavicle may be posttraumatic or postsurgical. IMPRESSION: 1. Acute, obliquely oriented fracture deformity involving the proximal shaft of the right humerus. 2. Advanced glenohumeral osteoarthritis. Electronically Signed   By: Signa Kell M.D.   On: 12/07/2023 10:24    EKG: Independently reviewed.  NSR with rate 80; nonspecific ST changes with no evidence of acute ischemia. Borderline QT    Labs on Admission: I have  personally reviewed the available labs and imaging studies at the time of the admission.  Pertinent labs:   WBC: 13.7,  potassium: 3.1,  BUN: 29   Assessment and Plan: Principal Problem:   Acute CVA (cerebrovascular accident) (HCC) Active Problems:   Closed right humeral fracture   Hypokalemia   Recurrent falls   Leukocytosis   Small cell lung cancer, right upper lobe (HCC)   Chronic respiratory failure with hypoxia (HCC)   Chemotherapy-induced neuropathy (HCC)   Tobacco abuse   borderline prolonged QT    Assessment and Plan: * Acute CVA (cerebrovascular accident) (HCC) 83 year old female presenting for recurrent falls over the past 2 days found to have small acute infarcts scattered in the left ACA and MCA distrubution on MRI, ?embolic source  -admit to progressive for acute CVA work up at [facility]. She is refusing to go to [facility] and will leave AMA if admitted there. Have admitted her at [facility] and discussed with neurology, they are aware she is here and refusing to go over there.  -neurology consulted  -Neurochecks per protocol -CTA head/neck pending  -echo -lipid panel and A1C  -AC per neurology, will decide when they see her.  -Permissive hypertension first 24 hours <220/110 -N.p.o. until bedside swallow screen-passed.  -PT/ OT/ SLP consult   Closed right humeral fracture S/p fall she has an acute, obliquely oriented fracture deformity of the proximal shaft of the right humerus In splint, ortho consulted.  Recommend no acute intervention. Continue sling, f/u with Dr. Junita Push at [facility] in 5-7 days. NWB Limited use of arms, OT to see   Hypokalemia Check magnesium Repleted in ED Continue tele and trend   Recurrent falls 4 falls in last day, states accident, but likely secondary to acute CVAs  Stroke w/u per #1 Check TSH/B12 PT to see, lives alone   Leukocytosis Likely hemoconcetrated, no other signs of infection or SIRS criteria  Given IVF, trend    Small cell  lung cancer, right upper lobe (HCC) Currently on palliative chemo with imfinzi q 4 weeks Last infusion 12/03/2023   Chronic respiratory failure with hypoxia (HCC) On 3L [place] at home PRN Stable, continue oxygen   Chemotherapy-induced neuropathy (HCC) Continue low dose gabapentin BID   Tobacco abuse Nicotine patch  No interest in stopping smoking   borderline prolonged QT Optimize electrolytes Keep on telemetry Avoid qt prolonging drugs  Repeat ekg in AM      Advance Care Planning:   Code Status: Limited: Do not attempt resuscitation (DNR) -DNR-LIMITED -Do Not Intubate/DNI    Consults: neurology   DVT Prophylaxis: lovenox   Family Communication: none   Severity of Illness: The appropriate patient status for this patient is INPATIENT. Inpatient status is judged to be reasonable and necessary in order  to provide the required intensity of service to ensure the patient's safety. The patient's presenting symptoms, physical exam findings, and initial radiographic and laboratory data in the context of their chronic comorbidities is felt to place them at high risk for further clinical deterioration. Furthermore, it is not anticipated that the patient will be medically stable for discharge from the hospital within 2 midnights of admission.   * I certify that at the point of admission it is my clinical judgment that the patient will require inpatient hospital care spanning beyond 2 midnights from the point of admission due to high intensity of service, high risk for further deterioration and high frequency of surveillance required.*  Author: Orland Mustard, MD 12/07/2023 7:48 PM  For on call review [email or web address].

## 2023-12-07 NOTE — Assessment & Plan Note (Signed)
 On 3L [place] at home PRN Stable, continue oxygen

## 2023-12-07 NOTE — ED Notes (Signed)
 This nurse called dining services and requested tray be sent to patient. Dining services agreed.

## 2023-12-07 NOTE — Assessment & Plan Note (Signed)
 Currently on palliative chemo with imfinzi q 4 weeks Last infusion 12/03/2023

## 2023-12-07 NOTE — Assessment & Plan Note (Signed)
 Optimize electrolytes Keep on telemetry Avoid qt prolonging drugs  Repeat ekg in AM

## 2023-12-07 NOTE — ED Provider Notes (Signed)
 [place] EMERGENCY DEPARTMENT AT [facility] Provider Note   CSN: [id number] Arrival date & time: 12/07/23  0900     History {Add pertinent medical, surgical, social history, OB history to HPI:1} Chief Complaint  Patient presents with   Brittany Marks is a 83 y.o. female with history of lung cancer and COPD presents the ED today after fall.  Patient reports she was turning off like the room and turned around and fell injuring her right shoulder and upper arm while she tried to catch herself.  She denies head injury or LOC.  She is not on blood thinners. States that this is her third fall in the past 24 hours. No associated lightheadedness, dizziness, or weakness.  Denies any pain to the neck back or lower extremities.  No additional complaints or concerns at this time.    Home Medications Prior to Admission medications   Medication Sig Start Date End Date Taking? Authorizing Provider  albuterol (VENTOLIN HFA) 108 (90 Base) MCG/ACT inhaler Inhale 2 puffs into the lungs every 6 (six) hours as needed for wheezing or shortness of breath. 08/07/22   Heilingoetter, Cassandra L, PA-C  Ascorbic Acid (VITAMIN C PO) Take by mouth daily.     [provider]  b complex vitamins tablet Take 1 tablet by mouth daily.    [provider]  COD LIVER OIL PO Take 1 tablet by mouth daily.     [provider]  gabapentin (NEURONTIN) 100 MG capsule Take 1 capsule (100 mg total) by mouth 2 (two) times daily. 12/03/23   Si Gaul, MD  hydrocortisone 2.5 % cream Apply topically as needed. Patient taking differently: Apply 1 application  topically daily as needed (Treatment). 12/21/19   Heilingoetter, Cassandra L, PA-C  ibuprofen (ADVIL) 200 MG tablet Take 600 mg by mouth daily.    [provider]  lidocaine-prilocaine (EMLA) cream Apply 1 Application topically every 30 (thirty) days. 08/07/22   Heilingoetter, Cassandra L, PA-C  Multiple Vitamin  (MULTIVITAMIN) tablet Take 1 tablet by mouth daily.    [provider]  ondansetron (ZOFRAN) 4 MG tablet Take 1 tablet (4 mg total) by mouth every 8 (eight) hours as needed for nausea or vomiting. 03/11/20   Shuford, French Ana, PA-C  oxymetazoline (AFRIN) 0.05 % nasal spray Place 2 sprays into the nose 2 (two) times daily as needed (Bloody nose).    [provider]  prochlorperazine (COMPAZINE) 10 MG tablet Take 1 tablet (10 mg total) by mouth every 6 (six) hours as needed for nausea or vomiting. 03/10/19   Si Gaul, MD  VITAMIN E PO Take 1 tablet by mouth daily.     [provider]      Allergies    Oxycodone    Review of Systems   Review of Systems  Musculoskeletal:  Positive for arthralgias.  All other systems reviewed and are negative.   Physical Exam Updated Vital Signs BP 113/81   Pulse 82   Temp 97.6 F (36.4 C)   Resp 16   Ht 5\' 2"  (1.575 m)   Wt 63.6 kg   SpO2 93%   BMI 25.66 kg/m  Physical Exam Vitals and nursing note reviewed.  Constitutional:      General: She is not in acute distress.    Appearance: Normal appearance.  HENT:     Head: Normocephalic and atraumatic.     Mouth/Throat:     Mouth: Mucous membranes are moist.  Eyes:  Conjunctiva/sclera: Conjunctivae normal.     Pupils: Pupils are equal, round, and reactive to light.  Cardiovascular:     Rate and Rhythm: Normal rate and regular rhythm.     Pulses: Normal pulses.  Pulmonary:     Effort: Pulmonary effort is normal.     Breath sounds: Normal breath sounds.  Abdominal:     Palpations: Abdomen is soft.     Tenderness: There is no abdominal tenderness.  Musculoskeletal:        General: Deformity present.     Comments: Deformity to the right anterior proximal humerus with tenderness to palpation. Tenderness to right shoulder with yellow-colored bruise present. Shoulder ROM impaired second to pain. No right elbow, forearm, wrist, or hand tenderness or deformity. Palpable  radial pulse. Grip strength intact. No tenderness or injuries   Skin:    General: Skin is warm and dry.     Findings: No rash.  Neurological:     General: No focal deficit present.     Mental Status: She is alert.  Psychiatric:        Mood and Affect: Mood normal.        Behavior: Behavior normal.    ED Results / Procedures / Treatments   Labs (all labs ordered are listed, but only abnormal results are displayed) Labs Reviewed  BASIC METABOLIC PANEL - Abnormal; Notable for the following components:      Result Value   Sodium 132 (*)    Potassium 3.1 (*)    Chloride 97 (*)    Glucose, Bld 115 (*)    BUN 29 (*)    Calcium 8.5 (*)    All other components within normal limits  CBC WITH DIFFERENTIAL/PLATELET - Abnormal; Notable for the following components:   WBC 13.7 (*)    Neutro Abs 11.5 (*)    All other components within normal limits  URINALYSIS, W/ REFLEX TO CULTURE (INFECTION SUSPECTED)  ETHANOL  PROTIME-INR  APTT  RAPID URINE DRUG SCREEN, HOSP PERFORMED  HEPATIC FUNCTION PANEL  MAGNESIUM  I-STAT CHEM 8, ED    EKG None  Radiology MR BRAIN W WO CONTRAST Result Date: 12/07/2023 CLINICAL DATA:  Metastatic disease evaluation EXAM: MRI HEAD WITHOUT AND WITH CONTRAST TECHNIQUE: Multiplanar, multiecho pulse sequences of the brain and surrounding structures were obtained without and with intravenous contrast. CONTRAST:  6mL GADAVIST GADOBUTROL 1 MMOL/ML IV SOLN COMPARISON:  None Available. FINDINGS: Brain: Small foci of restricted diffusion in the anterior left temporal lobe, left caudate head, and parasagittal left frontal lobe. These span the left MCA and ACA distributions. Extensive chronic small vessel ischemia in the cerebral white matter with confluent FLAIR hyperintensity. 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 Small chronic right frontal microhemorrhage Vascular: Major flow voids and vascular enhancements are preserved. Skull and upper cervical  spine: Normal marrow signal. Degeneration with C2-3 facet ankylosis. Sinuses/Orbits: Negative IMPRESSION: Small acute infarcts scattered in the left ACA and MCA distribution. Advanced chronic small vessel disease. Electronically Signed   By: Tiburcio Pea M.D.   On: 12/07/2023 12:38   DG Humerus Right Result Date: 12/07/2023 CLINICAL DATA:  Status post fall.  Decreased mobility. EXAM: RIGHT HUMERUS - 2+ VIEW COMPARISON:  None Available. FINDINGS: There is an acute, obliquely oriented, fracture deformity involving the proximal shaft of the right humerus. There is lateral and posterior angulation of the distal fracture fragments. Advanced arthro pathic changes involving the right glenohumeral joint. IMPRESSION: Acute, obliquely oriented, fracture deformity involving the proximal shaft  of the right humerus. Electronically Signed   By: Signa Kell M.D.   On: 12/07/2023 10:26   DG Shoulder Right Result Date: 12/07/2023 CLINICAL DATA:  Fall.  Pain and decreased mobility. EXAM: RIGHT SHOULDER - 2+ VIEW COMPARISON:  None Available. FINDINGS: There is and acute, obliquely oriented fracture deformity involving the proximal shaft of the right humerus. There is posterior and lateral angulation of the distal fracture fragments. Advanced arthro pathic changes are noted involving the glenohumeral joint. Foreshortening of the right clavicle may be posttraumatic or postsurgical. IMPRESSION: 1. Acute, obliquely oriented fracture deformity involving the proximal shaft of the right humerus. 2. Advanced glenohumeral osteoarthritis. Electronically Signed   By: Signa Kell M.D.   On: 12/07/2023 10:24    Procedures .Ortho Injury Treatment  Date/Time: 12/07/2023 12:40 PM  Performed by: Tonye Pearson Authorized by: Maxwell Marion, PA-C  Injury location: upper arm Location details: right upper arm Injury type: fracture Fracture type: humeral shaft Pre-procedure neurovascular assessment: neurovascularly  intact Immobilization: splint Splint type: coaptation. Splint Applied by: Milon Dikes   .Critical Care  Performed by: Maxwell Marion, PA-C Authorized by: Maxwell Marion, PA-C   Critical care provider statement:    Critical care time (minutes):  30   Critical care was necessary to treat or prevent imminent or life-threatening deterioration of the following conditions:  CNS failure or compromise   Critical care was time spent personally by me on the following activities:  Development of treatment plan with patient or surrogate, discussions with consultants, evaluation of patient's response to treatment, examination of patient, ordering and review of laboratory studies, ordering and review of radiographic studies, ordering and performing treatments and interventions, pulse oximetry, re-evaluation of patient's condition and review of old charts  {Document cardiac monitor, telemetry assessment procedure when appropriate:1}  Medications Ordered in ED Medications  iohexol (OMNIPAQUE) 350 MG/ML injection 100 mL (has no administration in time range)  sodium chloride 0.9 % bolus 1,000 mL (0 mLs Intravenous Stopped 12/07/23 1230)  potassium chloride SA (KLOR-CON M) CR tablet 80 mEq (80 mEq Oral Given 12/07/23 1054)  morphine (PF) 2 MG/ML injection 2 mg (2 mg Intravenous Given 12/07/23 1126)  gadobutrol (GADAVIST) 1 MMOL/ML injection 6 mL (6 mLs Intravenous Contrast Given 12/07/23 1211)    ED Course/ Medical Decision Making/ A&P   {   Click here for ABCD2, HEART and other calculatorsREFRESH Note before signing :1}                              Medical Decision Making Amount and/or Complexity of Data Reviewed Labs: ordered. Radiology: ordered.  Risk Prescription drug management.   This patient presents to the ED for concern of right shoulder and humerus pain after fall, this involves an extensive number of treatment options, and is a complaint that carries with it a high risk of complications and  morbidity.   Differential diagnosis includes: fracture, dislocation, contusion, abrasion, muscle strain, ligamentous injury, dehydration, UTI, cancer metastasis, etc.   Comorbidities  See HPI above   Additional History  Additional history obtained from prior records   Lab Tests  I ordered and personally interpreted labs.  The pertinent results include:   Potassium of 3.1, BUN of 29 WBC of 13.7   Imaging Studies  I ordered imaging studies including right shoulder and humerus x-rays, MRI brain I independently visualized and interpreted imaging which showed:  Acute, obliquely oriented fractured deformity involving the proximal shaft  of right humerus I agree with the radiologist interpretation   Consultations  I requested consultation with Dr. Iver Nestle with neurology,  and discussed lab and imaging findings as well as pertinent plan - they recommend: admit to hospitalist at [facility], get CTA head and neck I requested consultation with Dr. Artis Flock with [facility],  and discussed lab and imaging findings as well as pertinent plan - they recommend: admission for further workup and evaluation. Advised to consult orthopedics to make sure nothing else is required. Orthopedic consult pending at shift change.   Problem List / ED Course / Critical Interventions / Medication Management  Right upper arm and shoulder pain after fall this morning. This is her third fall in 24 hours.  Denies associated dizziness, lightheadedness, or weakness.  She has not hit her head during these falls or lost consciousness.  She is not on blood thinners. There's gross deformity of the right humerus and impaired ROM second to pain. Right arm remains neurovascularly intact. Staffed with my attending, Dr. Rubin Payor. I ordered medications including: Morphine for pain Oral potassium for hypokalemia Normal saline for dehydration  Reevaluation of the patient after these medicines showed that the patient  improved. Patient's arm has been splinted and sling has been given. I have reviewed the patients home medicines and have made adjustments as needed Discussed findings with patient.  All questions were answered.  She is agreeable with plan for admission.   Social Determinants of Health  Physical activity   Test / Admission - Considered  Patient is agreeable with plan for admission. {Document critical care time when appropriate:1} {Document review of labs and clinical decision tools ie heart score, Chads2Vasc2 etc:1}  {Document your independent review of radiology images, and any outside records:1} {Document your discussion with family members, caretakers, and with consultants:1} {Document social determinants of health affecting pt's care:1} {Document your decision making why or why not admission, treatments were needed:1} Final Clinical Impression(s) / ED Diagnoses Final diagnoses:  Other closed nondisplaced fracture of proximal end of right humerus, initial encounter  Cerebrovascular accident (CVA), unspecified mechanism (HCC)    Rx / DC Orders ED Discharge Orders     None

## 2023-12-07 NOTE — Progress Notes (Signed)
 Orthopedic Tech Progress Note Patient Details:  Brittany Marks Apr 11, 1941 [id number]  Ortho Devices Type of Ortho Device: Shoulder immobilizer Ortho Device/Splint Location: RUE Ortho Device/Splint Interventions: Ordered, Application, Adjustment   Post Interventions Patient Tolerated: Well Instructions Provided: Care of device  Tonye Pearson 12/07/2023, 2:41 PM

## 2023-12-07 NOTE — ED Notes (Signed)
 Provider notified patient will be admitted. Okay to order diet. Patient needs swallow screen prior to diet order. Order for swallow screen entered at this time.

## 2023-12-07 NOTE — ED Notes (Signed)
 Patient gives permission for [place] to speak to her neighbor James [place]. Fayrene Fearing states he is also the transportation for patient if discharged. Fayrene Fearing number is [phone] and is requesting a call if patient is discharged and/or call if patient is going to be admitted.

## 2023-12-07 NOTE — ED Notes (Signed)
 Assumed care of patient. Patient currently has a splint applied to the right humeral area. The proximal hand is a deep red with blueish/purpleish tent noted to it. Called the Ortho Tech to come and reassess patient.

## 2023-12-07 NOTE — ED Notes (Signed)
 Patient took potassium . Pills were broken in half, patient took two halves with sips of water and without difficulty

## 2023-12-07 NOTE — ED Triage Notes (Addendum)
 Patient bib gems C/o fall this morning Tried to catch self, right shoulder hurts 3rd fall in 24 hours Pain rated by patient 8/10 when moving arm No pain when moving arm  Fall risk bracelet applied to patient  Patient denies loc Denies hitting head Says falling a lot after last cancer treatment this week Denies any UTI symptoms

## 2023-12-08 ENCOUNTER — Inpatient Hospital Stay (HOSPITAL_COMMUNITY)

## 2023-12-08 ENCOUNTER — Other Ambulatory Visit: Payer: Self-pay

## 2023-12-08 ENCOUNTER — Encounter (HOSPITAL_COMMUNITY): Payer: Self-pay | Admitting: Family Medicine

## 2023-12-08 DIAGNOSIS — I6389 Other cerebral infarction: Secondary | ICD-10-CM | POA: Diagnosis not present

## 2023-12-08 DIAGNOSIS — I639 Cerebral infarction, unspecified: Secondary | ICD-10-CM

## 2023-12-08 LAB — ECHOCARDIOGRAM COMPLETE
AR max vel: 1.19 cm2
AV Area VTI: 1.15 cm2
AV Area mean vel: 0.99 cm2
AV Mean grad: 7 mmHg
AV Peak grad: 10.2 mmHg
Ao pk vel: 1.6 m/s
Area-P 1/2: 4.19 cm2
Calc EF: 49.3 %
Height: 62 in
MV VTI: 1.11 cm2
S' Lateral: 4.1 cm
Single Plane A2C EF: 48 %
Single Plane A4C EF: 47.5 %
Weight: 2244.81 [oz_av]

## 2023-12-08 LAB — HEMOGLOBIN A1C
Hgb A1c MFr Bld: 5.9 % — ABNORMAL HIGH (ref 4.8–5.6)
Mean Plasma Glucose: 122.63 mg/dL

## 2023-12-08 LAB — LIPID PANEL
Cholesterol: 173 mg/dL (ref 0–200)
HDL: 53 mg/dL (ref >40)
LDL Cholesterol: 103 mg/dL — ABNORMAL HIGH (ref 0–99)
Total CHOL/HDL Ratio: 3.3 ratio
Triglycerides: 86 mg/dL (ref <150)
VLDL: 17 mg/dL (ref 0–40)

## 2023-12-08 MED ORDER — ASPIRIN 81 MG PO TBEC
81.0000 mg | DELAYED_RELEASE_TABLET | Freq: Every day | ORAL | Status: DC
  Administered 2023-12-08 – 2023-12-12 (×5): 81 mg via ORAL
  Filled 2023-12-08 (×5): qty 1

## 2023-12-08 MED ORDER — CARMEX CLASSIC LIP BALM EX OINT
TOPICAL_OINTMENT | CUTANEOUS | Status: DC | PRN
  Filled 2023-12-08: qty 10

## 2023-12-08 MED ORDER — ATORVASTATIN CALCIUM 40 MG PO TABS
40.0000 mg | ORAL_TABLET | Freq: Every day | ORAL | Status: DC
  Administered 2023-12-08 – 2023-12-12 (×5): 40 mg via ORAL
  Filled 2023-12-08 (×5): qty 1

## 2023-12-08 NOTE — TOC Initial Note (Signed)
 Transition of Care [facility]) - Initial/Assessment Note    Patient Details  Name: Brittany Marks MRN: [id number] Date of Birth: [birth year]  [facility] CM/SW Contact:    Diona Browner, LCSW Phone Number: 12/08/2023, 12:39 PM  Clinical Narrative:                 Pt from home alone. Awaiting PT recommendation. TOC following.    Barriers to Discharge: Continued Medical Work up   Patient Goals and CMS Choice Patient states their goals for this hospitalization and ongoing recovery are:: return home          Expected Discharge Plan and Services       Living arrangements for the past 2 months: Single Family Home                                      Prior Living Arrangements/Services Living arrangements for the past 2 months: Single Family Home Lives with:: Self Patient language and need for interpreter reviewed:: Yes Do you feel safe going back to the place where you live?: Yes      Need for Family Participation in Patient Care: Yes (Comment) Care giver support system in place?: Yes (comment)   Criminal Activity/Legal Involvement Pertinent to Current Situation/Hospitalization: No - Comment as needed  Activities of Daily Living   ADL Screening (condition at time of admission) Independently performs ADLs?: Yes (appropriate for developmental age) Is the patient deaf or have difficulty hearing?: No Does the patient have difficulty seeing, even when wearing glasses/contacts?: No Does the patient have difficulty concentrating, remembering, or making decisions?: No  Permission Sought/Granted                  Emotional Assessment Appearance:: Appears stated age Attitude/Demeanor/Rapport: Engaged Affect (typically observed): Accepting Orientation: : Oriented to Self, Oriented to Place, Oriented to  Time, Oriented to Situation Alcohol / Substance Use: Not Applicable Psych Involvement: No (comment)  Admission diagnosis:  Acute CVA (cerebrovascular  accident) (HCC) [I63.9] Cerebrovascular accident (CVA), unspecified mechanism (HCC) [I63.9] Other closed nondisplaced fracture of proximal end of right humerus, initial encounter [S42.294A] Patient Active Problem List   Diagnosis Date Noted   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   Acute CVA (cerebrovascular accident) (HCC) 12/07/2023   Closed right humeral fracture 12/07/2023   Hypokalemia 12/07/2023   Tobacco abuse 12/07/2023   Chronic respiratory failure with hypoxia (HCC) 12/07/2023   Leukocytosis 12/07/2023   Recurrent falls 12/07/2023   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   S/P reverse total shoulder arthroplasty, 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   Port-A-Cath in place 05/25/2019   Small cell lung cancer, 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   Goals of care, counseling/discussion 03/10/2019   Encounter for antineoplastic chemotherapy 03/10/2019   Encounter for antineoplastic immunotherapy 03/10/2019   Pathologic fracture of right humerus 02/25/2019   Lymphadenopathy, abdominal 01/02/2019   Lymphadenopathy, mediastinal 01/02/2019   Pathological fracture, left humerus, initial encounter for fracture 12/26/2018   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   PCP:  Pcp, No Pharmacy:   [id number] - [place], [place] - [address] AT [facility] [address] [place] [address] Phone: [phone] Fax: [phone]     Social Drivers of Health (SDOH) Social History: SDOH Screenings   Food Insecurity: No Food Insecurity (12/07/2023)  Housing: Low Risk  (12/07/2023)  Transportation Needs:  No Transportation Needs (12/07/2023)  Utilities: Not At Risk (12/07/2023)  Social Connections: Unknown (12/07/2023)  Tobacco Use: High Risk (12/08/2023)   SDOH Interventions:     Readmission Risk Interventions    12/08/2023   12:37 PM  Readmission Risk Prevention Plan  Post Dischage Appt Complete   Medication Screening Complete  Transportation Screening Complete

## 2023-12-08 NOTE — Evaluation (Signed)
 Occupational Therapy Evaluation Patient Details Name: Brittany Marks MRN: [id number] DOB: [birth year] Today's Date: 12/08/2023   History of Present Illness   Patient is a 83 year old female who was admitted on 3/1 with right shoulder pain. Patient was admitted with acute CVA, right proximal humerus fracture, hypokalemia. 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     Clinical Impressions Patient is a 83 year old female who presented for above. Patient was living at home alone prior level per patient report. Patient was noted to have significant restrictions with limited ROM of LUE and RUE in sling with NWB restrictions. Patient needed max A for All ADLs. Patient reported plan was to return  home alone but patient would need 24/7 caregiver support to be successful in next level of care. Patient was noted to have decreased functional activity tolernace, decreased ROM, decreased BUE strength, decreased endurance, decreased sitting balance, decreased standing balanced, decreased safety awareness, and decreased knowledge of AE/AD impacting participation in ADLs. Patient would continue to benefit from skilled OT services at this time while admitted and after d/c to address noted deficits in order to improve overall safety and independence in ADLs. Patient will benefit from continued inpatient follow up therapy, <3 hours/day.      If plan is discharge home, recommend the following:   A lot of help with walking and/or transfers;A lot of help with bathing/dressing/bathroom;Assistance with cooking/housework;Direct supervision/assist for medications management;Assist for transportation;Help with stairs or ramp for entrance;Direct supervision/assist for financial management;Assistance with feeding     Functional Status Assessment   Patient has had a recent decline in their functional status and  demonstrates the ability to make significant improvements in function in a reasonable and predictable amount of time.       Precautions/Restrictions   Precautions Precautions: Fall Recall of Precautions/Restrictions: Impaired Required Braces or Orthoses: Sling Restrictions Weight Bearing Restrictions Per Provider Order: Yes RUE Weight Bearing Per Provider Order: Non weight bearing     Mobility Bed Mobility Overal bed mobility: Needs Assistance             General bed mobility comments: up on BSC upon entrance to room          Balance Overall balance assessment: Mild deficits observed, not formally tested               ADL either performed or assessed with clinical judgement   ADL Overall ADL's : Needs assistance/impaired Eating/Feeding: Maximal assistance;Sitting Eating/Feeding Details (indicate cue type and reason): per nursing. elbow WFL on LUE simulated tasks Grooming: Sitting;Maximal assistance   Upper Body Bathing: Sitting;Total assistance   Lower Body Bathing: Bed level;Total assistance   Upper Body Dressing : Sitting;Total assistance   Lower Body Dressing: Bed level;Total assistance   Toilet Transfer: Minimal assistance;Stand-pivot Toilet Transfer Details (indicate cue type and reason): HHA Toileting- Clothing Manipulation and Hygiene: Sit to/from stand;Total assistance Toileting - Clothing Manipulation Details (indicate cue type and reason): patient physically unable to reach posteriorlyduring session with LUE             Vision   Vision Assessment?: No apparent visual deficits            Pertinent Vitals/Pain Pain Assessment Pain Assessment: Faces Faces Pain Scale: Hurts little more Pain Location: RUE Pain Descriptors / Indicators: Grimacing, Guarding, Constant Pain Intervention(s): Limited activity within patient's tolerance, Monitored during session     Extremity/Trunk  Assessment Upper Extremity Assessment Upper Extremity  Assessment: Right hand dominant;RUE deficits/detail;LUE deficits/detail RUE Deficits / Details: able to wiggle digits. no WB and sling all times per ortho recs RUE: Unable to fully assess due to pain;Unable to fully assess due to immobilization RUE Coordination: decreased fine motor;decreased gross motor LUE Deficits / Details: h/o injury per patient able to ROM shoulder about 20 degrees max FF, elbow WFL LUE Coordination: decreased gross motor   Lower Extremity Assessment Lower Extremity Assessment: Defer to PT evaluation   Cervical / Trunk Assessment Cervical / Trunk Assessment: Kyphotic      Cognition Arousal: Alert Behavior During Therapy: Flat affect Cognition: Difficult to assess             OT - Cognition Comments: patient was easily angered during session. very intrinsically motivated.                 Following commands: Impaired                  Home Living Family/patient expects to be discharged to:: Private residence Living Arrangements: Alone Available Help at Discharge: Neighbor;Available PRN/intermittently Type of Home: House Home Access: Stairs to enter [employer] of Steps: 4 Entrance Stairs-Rails: Right Home Layout: One level     Bathroom Shower/Tub: Walk-in shower                    Prior Functioning/Environment Prior Level of Function : Independent/Modified Independent                    OT Problem List: Decreased strength;Impaired balance (sitting and/or standing);Decreased knowledge of precautions;Cardiopulmonary status limiting activity;Decreased safety awareness;Decreased knowledge of use of DME or AE;Decreased activity tolerance   OT Treatment/Interventions: Self-care/ADL training;DME and/or AE instruction;Therapeutic activities;Balance training;Therapeutic exercise;Patient/family education;Energy conservation      OT Goals(Current goals can be found in the care plan section)   Acute Rehab OT  Goals Patient Stated Goal: to go home OT Goal Formulation: Patient unable to participate in goal setting Time For Goal Achievement: 12/22/23 Potential to Achieve Goals: Fair   OT Frequency:  Min 1X/week       AM-PAC OT "6 Clicks" Daily Activity     Outcome Measure Help from another person eating meals?: A Little Help from another person taking care of personal grooming?: A Lot Help from another person toileting, which includes using toliet, bedpan, or urinal?: A Lot Help from another person bathing (including washing, rinsing, drying)?: A Lot Help from another person to put on and taking off regular upper body clothing?: A Lot Help from another person to put on and taking off regular lower body clothing?: A Lot 6 Click Score: 13   End of Session Equipment Utilized During Treatment: Gait belt;Other (comment) [facility]) Nurse Communication: Mobility status  Activity Tolerance: Patient tolerated treatment well Patient left: in chair;with call bell/phone within reach;with chair alarm set  OT Visit Diagnosis: Unsteadiness on feet (R26.81);Other abnormalities of gait and mobility (R26.89);Pain Pain - Right/Left: Right Pain - part of body: Shoulder                Time: 0902-0919 OT Time Calculation (min): 17 min Charges:  OT General Charges $OT Visit: 1 Visit OT Evaluation $OT Eval Moderate Complexity: 1 Mod  Hermen Mario OTR/L, MS Acute Rehabilitation Department Office# [phone]   Selinda Flavin 12/08/2023, 1:42 PM

## 2023-12-08 NOTE — Evaluation (Signed)
 Physical Therapy Evaluation Patient Details Name: Brittany Marks MRN: [id number] DOB: Feb 17, 1941 Today's Date: 12/08/2023  History of Present Illness  Patient is a 83 year old female who was admitted on 3/1 with right shoulder pain. Patient was admitted with acute CVA, right proximal humerus fracture, hypokalemia. 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  Clinical Impression  Pt very flat affect during eval and seemed to be very sleepy and no forth coming when asking my questions to learn more about her PLOF. She did perform and want to get up and walk in room. 1 Hand held assist   and 3 L O2. Challenge for her will be bed mobility and maintaining her balance and ADLS all day long home alone with her dominant arm with a fracture and NWB. Will continue to follow while here. Patient will benefit from continued inpatient follow up therapy, <3 hours/day       If plan is discharge home, recommend the following: A little help with walking and/or transfers;A little help with bathing/dressing/bathroom;Assistance with cooking/housework;Assistance with feeding;Assist for transportation   Can travel by private vehicle   Yes    Equipment Recommendations Other (comment) (may need a hemiwalker)  Recommendations for Other Services       Functional Status Assessment Patient has had a recent decline in their functional status and demonstrates the ability to make significant improvements in function in a reasonable and predictable amount of time.     Precautions / Restrictions Precautions Precautions: Fall Recall of Precautions/Restrictions: Impaired Required Braces or Orthoses: Sling Restrictions Weight Bearing Restrictions Per Provider Order: Yes RUE Weight Bearing Per Provider Order: Non weight bearing      Mobility  Bed Mobility               General bed mobility comments: pot was up  in recliner when I arrived    Transfers Overall transfer level: Needs assistance Equipment used: 1 person hand held assist                    Ambulation/Gait   Gait Distance (Feet): 25 Feet Assistive device: 1 person hand held assist Gait Pattern/deviations: Step-through pattern       General Gait Details: assisted gait with LUE hand held assist. Walked to window and back , will need to bring O2 tank next time to try with cane and go further.  Stairs            Wheelchair Mobility     Tilt Bed    Modified Rankin (Stroke Patients Only)       Balance Overall balance assessment: Mild deficits observed, not formally tested                                           Pertinent Vitals/Pain Pain Assessment Faces Pain Scale: Hurts a little bit Pain Location: RUE Pain Descriptors / Indicators: Sore Pain Intervention(s): Limited activity within patient's tolerance, Monitored during session    Home Living Family/patient expects to be discharged to:: Private residence Living Arrangements: Alone Available Help at Discharge: Neighbor;Available PRN/intermittently Type of Home: House Home Access: Stairs to enter Entrance Stairs-Rails: Right Entrance Stairs-Number of Steps: 4   Home Layout: One level Home Equipment: Cane - single point;Rollator (4 wheels)      Prior Function  Prior Level of Function : Independent/Modified Independent             Mobility Comments: stated she doesn't have family, but has friedn who brings her groceries. and housekeeper to help with home cleaning. Otherwise she was mobile wiht no AD prior to this event. has O2 3L at home with home concentrator.       Extremity/Trunk Assessment   Upper Extremity Assessment Upper Extremity Assessment: Right hand dominant;RUE deficits/detail;LUE deficits/detail RUE Deficits / Details: able to wiggle digits. no WB and sling all times per ortho recs RUE: Unable to fully  assess due to pain;Unable to fully assess due to immobilization RUE Coordination: decreased fine motor;decreased gross motor LUE Deficits / Details: h/o injury per patient able to ROM shoulder about 20 degrees max FF, elbow WFL LUE Coordination: decreased gross motor    Lower Extremity Assessment Lower Extremity Assessment: Overall WFL for tasks assessed    Cervical / Trunk Assessment Cervical / Trunk Assessment: Kyphotic  Communication        Cognition Arousal: Alert Behavior During Therapy: Flat affect                           PT - Cognition Comments: pt stated she was really groggy, not talkative at all, seemed frustrated with the situatin but really just a flat affect.         Cueing Cueing Techniques: Verbal cues, Visual cues     General Comments      Exercises     Assessment/Plan    PT Assessment Patient needs continued PT services  PT Problem List Decreased activity tolerance;Decreased balance;Decreased mobility       PT Treatment Interventions DME instruction;Gait training;Stair training;Functional mobility training;Therapeutic activities;Therapeutic exercise;Patient/family education    PT Goals (Current goals can be found in the Care Plan section)  Acute Rehab PT Goals Patient Stated Goal: I want to go home PT Goal Formulation: With patient Time For Goal Achievement: 12/22/23 Potential to Achieve Goals: Fair    Frequency Min 3X/week     Co-evaluation               AM-PAC PT "6 Clicks" Mobility  Outcome Measure Help needed turning from your back to your side while in a flat bed without using bedrails?: A Little Help needed moving from lying on your back to sitting on the side of a flat bed without using bedrails?: A Little Help needed moving to and from a bed to a chair (including a wheelchair)?: A Little Help needed standing up from a chair using your arms (e.g., wheelchair or bedside chair)?: A Little Help needed to walk in  hospital room?: A Little Help needed climbing 3-5 steps with a railing? : A Little 6 Click Score: 18    End of Session   Activity Tolerance: Patient tolerated treatment well Patient left: in chair;with call bell/phone within reach;with chair alarm set Nurse Communication: Mobility status PT Visit Diagnosis: Other abnormalities of gait and mobility (R26.89)    Time: 1030-1058 PT Time Calculation (min) (ACUTE ONLY): 28 min   Charges:   PT Evaluation $PT Eval Low Complexity: 1 Low PT Treatments $Gait Training: 8-22 mins PT General Charges $$ ACUTE PT VISIT: 1 Visit          Trevia Nop, PT, MPT Acute Rehabilitation Services Office: [phone] If a weekend: secure chat groups: [facility] PT, [facility] OT, [facility] SLP 12/08/2023   Isaid Salvia 12/08/2023, 2:00 PM

## 2023-12-08 NOTE — Consult Note (Signed)
 NEUROLOGY CONSULT NOTE   Date of service: December 08, 2023 Patient Name: Brittany Marks MRN:  [id number] DOB:  09/10/1941 Chief Complaint: "falls, stroke on MRI" Requesting Provider: Orland Mustard, MD  History of Present Illness  Brittany Marks is a 83 y.o. 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  She reports that she tried to sit down on her walker and it slipped out from underneath her and she fell on her outstretched arm and fractured her arm.  She was put in a sling when she came to the ED.  She endorses that one of her fall, she felt that her right knee gave out and with another 1, she felt like she lost her balance.  She denies any other falls except for the for over the last week.  She had workup here with MRI brain which demonstrated embolic appearing infarcts and neurology was consulted further evaluation and workup.  LKW: Unclear since she does not have any focal deficit at this time. Modified rankin score: 1-No significant post stroke disability and can perform usual duties with stroke symptoms IV Thrombolysis: Not offered due to unclear last known well.   EVT: Not offered due to unclear last known well and no LVO.    NIHSS components Score: Comment  1a Level of Conscious 0[x]  1[]  2[]  3[]      1b LOC Questions 0[x]  1[]  2[]       1c LOC Commands 0[x]  1[]  2[]       2 Best Gaze 0[x]  1[]  2[]       3 Visual 0[x]  1[]  2[]  3[]      4 Facial Palsy 0[x]  1[]  2[]  3[]      5a Motor Arm - left 0[]  1[]  2[x]  3[]  4[]  UN[]  Weak L arm shoulder abduction due to prior tumor removal requiring removal of her deltoid muscle.  5b Motor Arm - Right 0[]  1[]  2[]  3[x]  4[]  UN[]  R arm in sling due to fracture  6a Motor Leg - Left 0[x]  1[]  2[]  3[]  4[]  UN[]    6b Motor Leg - Right 0[x]  1[]  2[]  3[]  4[]  UN[]    7 Limb Ataxia 0[x]  1[]  2[]  3[]  UN[]     8 Sensory 0[x]  1[]   2[]  UN[]      9 Best Language 0[x]  1[]  2[]  3[]      10 Dysarthria 0[x]  1[]  2[]  UN[]      11 Extinct. and Inattention 0[x]  1[]  2[]       TOTAL: 5     ROS  Comprehensive ROS performed and pertinent positives documented in HPI   Past History   Past Medical History:  Diagnosis Date   Acute CVA (cerebrovascular accident) (HCC) 12/07/2023   Cataract    Bilateral   Constipation    pain medication   Left rotator cuff tear    Right rotator cuff tear    SCL CA dx'd 11/2018    Past Surgical History:  Procedure Laterality Date   CATARACT EXTRACTION W/ INTRAOCULAR LENS  IMPLANT, BILATERAL  2012   DECOMPRESSIVE LUMBAR LAMINECTOMY LEVEL 2 N/A 07/29/2013   Procedure: LUMBAR LAMINECTOMY, DECOMPRESSION LUMBAR THREE TO FOUR, FOUR TO FIVE microdiscectomy l3,4 right;  Surgeon: Jacki Cones, MD;  Location: [facility];  Service: Orthopedics;  Laterality: N/A;   I & D SUPERIOR RIGHT SHOULDER AND CLOSURE WOUND  01-10-2011   S/P ROTATOR CUFF REPAIR  IR IMAGING GUIDED PORT INSERTION  04/09/2019   LUMBAR LAMINECTOMY  1970'S   LUMBAR LAMINECTOMY/DECOMPRESSION MICRODISCECTOMY N/A 06/27/2016   Procedure: L1 - L2 DISCECTOMY;  Surgeon: Venita Lick, MD;  Location: [facility];  Service: Orthopedics;  Laterality: N/A;   LUMBAR SPINE SURGERY  1983   REVERSE SHOULDER ARTHROPLASTY Left 03/10/2020   Procedure: REVERSE SHOULDER ARTHROPLASTY;  Surgeon: Francena Hanly, MD;  Location: [facility];  Service: Orthopedics;  Laterality: Left;    RIGHT SHOULDER ARTHROSCOPY/ OPEN DISTAL CLAVICLE RESECTION/ SAD/ OPEN ROTATOR CUFF REPAIR  11-28-2010   SHOULDER FUSION SURGERY  03/11/2020   Shoulder Surgery , hardware placed   SHOULDER OPEN ROTATOR CUFF REPAIR  12/19/2011   Procedure: ROTATOR CUFF REPAIR SHOULDER OPEN;  Surgeon: Drucilla Schmidt, MD;  Location: [facility];  Service: Orthopedics;  Laterality: Right;  RIGHT RECURRENT OPEN REPAIR OF THE ROTATOR CUFF WITH TISSUE MEND GRAFTANTERIOR CHROMIOECTOMY   VAGINAL  HYSTERECTOMY  1979    Family History: Family History  Problem Relation Age of Onset   Congestive Heart Failure Mother    Congestive Heart Failure Father     Social History  reports that she has been smoking cigarettes. She has a 25 pack-year smoking history. She has never used smokeless tobacco. She reports current alcohol use. She reports that she does not use drugs.  Allergies  Allergen Reactions   Oxycodone Other (See Comments)    Confusion/ near fall    Medications   Current Facility-Administered Medications:     stroke: early stages of recovery book, , Does not apply, Once, Orland Mustard, MD   acetaminophen (TYLENOL) tablet 650 mg, 650 mg, Oral, Q4H PRN **OR** acetaminophen (TYLENOL) 160 MG/5ML solution 650 mg, 650 mg, Per Tube, Q4H PRN **OR** acetaminophen (TYLENOL) suppository 650 mg, 650 mg, Rectal, Q4H PRN, Orland Mustard, MD   albuterol (PROVENTIL) (2.5 MG/3ML) 0.083% nebulizer solution 2.5 mg, 2.5 mg, Nebulization, Q6H PRN, Orland Mustard, MD   enoxaparin (LOVENOX) injection 40 mg, 40 mg, Subcutaneous, QHS, Orland Mustard, MD, 40 mg at 12/07/23 2151   gabapentin (NEURONTIN) capsule 100 mg, 100 mg, Oral, BID, Orland Mustard, MD   morphine (PF) 2 MG/ML injection 1 mg, 1 mg, Intravenous, Q3H PRN, Orland Mustard, MD   multivitamin with minerals tablet 1 tablet, 1 tablet, Oral, Q breakfast, Orland Mustard, MD   nicotine (NICODERM CQ - dosed in mg/24 hours) patch 14 mg, 14 mg, Transdermal, Daily, Orland Mustard, MD, 14 mg at 12/07/23 1625   senna-docusate (Senokot-S) tablet 1 tablet, 1 tablet, Oral, QHS PRN, Orland Mustard, MD   sodium chloride flush (NS) 0.9 % injection 3-10 mL, 3-10 mL, Intravenous, Q12H, Orland Mustard, MD, 10 mL at 12/07/23 2151   sodium chloride flush (NS) 0.9 % injection 3-10 mL, 3-10 mL, Intravenous, PRN, Orland Mustard, MD  Facility-Administered Medications Ordered in Other Encounters:    heparin lock flush 100 unit/mL, 500 Units, Intracatheter, Once  PRN, Magrinat, Valentino Hue, MD   sodium chloride flush (NS) 0.9 % injection 10 mL, 10 mL, Intracatheter, PRN, Lowella Dell, MD  Vitals   Vitals:   12/07/23 1600 12/07/23 1734 12/07/23 2142 12/08/23 0123  BP: (!) 136/109 (!) 124/103 (!) 92/57 119/81  Pulse: 90 79 82 80  Resp:   15 20  Temp:  98.2 F (36.8 C) 98.2 F (36.8 C) 98.2 F (36.8 C)  TempSrc:  Oral Oral Oral  SpO2: 99% 96% 92% 95%  Weight:      Height:  Body mass index is 25.66 kg/m.  Physical Exam   General: Laying comfortably in bed; in no acute distress.  HENT: Normal oropharynx and mucosa. Normal external appearance of ears and nose.  Neck: Supple, no pain or tenderness  CV: No JVD. No peripheral edema.  Pulmonary: Symmetric Chest rise. Normal respiratory effort.  Abdomen: Soft to touch, non-tender.  Ext: No cyanosis, edema, or deformity  Skin: No rash. Normal palpation of skin.   Musculoskeletal: Normal digits and nails by inspection. No clubbing.   Neurologic Examination  Mental status/Cognition: Alert, oriented to self, place, month and year, good attention.  Speech/language: Fluent, comprehension intact, object naming intact, repetition intact.  Cranial nerves:   CN II Pupils equal and reactive to light, no VF deficits    CN III,IV,VI EOM intact, no gaze preference or deviation, no nystagmus    CN V normal sensation in V1, V2, and V3 segments bilaterally    CN VII no asymmetry, no nasolabial fold flattening    CN VIII normal hearing to speech    CN IX & X normal palatal elevation, no uvular deviation    CN XI 5/5 head turn and 5/5 shoulder shrug bilaterally    CN XII midline tongue protrusion    Motor:  Muscle bulk: poor, tone normal Mvmt Root Nerve  Muscle Right Left Comments  SA C5/6 Ax Deltoid - 2 Right arm in sling due to fracture  EF C5/6 Mc Biceps - 4   EE C6/7/8 Rad Triceps - 4   WF C6/7 Med FCR     WE C7/8 PIN ECU     F Ab C8/T1 U ADM/FDI 5 5   HF L1/2/3 Fem Illopsoas 4 4   KE  L2/3/4 Fem Quad 4+ 4+   DF L4/5 D Peron Tib Ant 5 5   PF S1/2 Tibial Grc/Sol 5 5    Sensation:  Light touch Intact throughout   Pin prick    Temperature    Vibration   Proprioception    Coordination/Complex Motor:  - Finger to Nose intact on the left.  Unable to do with the right arm due to it being in a sling. - Heel to shin intact bilaterally - Rapid alternating movement are slowed bilaterally. - Gait: Furred for patient's safety given multiple falls. Labs/Imaging/Neurodiagnostic studies   CBC:  Recent Labs  Lab 2023/12/20 1338 12/07/23 0954 12/07/23 1605  WBC 8.8 13.7*  --   NEUTROABS 6.7 11.5*  --   HGB 14.0 14.5 13.9  HCT 40.4 44.7 41.0  MCV 94.6 99.3  --   PLT 236 264  --    Basic Metabolic Panel:  Lab Results  Component Value Date   NA 135 12/07/2023   K 4.2 12/07/2023   CO2 22 12/07/2023   GLUCOSE 123 (H) 12/07/2023   BUN 25 (H) 12/07/2023   CREATININE 1.00 12/07/2023   CALCIUM 8.5 (L) 12/07/2023   GFRNONAA >60 12/07/2023   GFRAA >60 07/11/2020   Lipid Panel: No results found for: "LDLCALC" HgbA1c: No results found for: "HGBA1C" Urine Drug Screen:     Component Value Date/Time   LABOPIA POSITIVE (A) 12/07/2023 1720   COCAINSCRNUR NONE DETECTED 12/07/2023 1720   LABBENZ NONE DETECTED 12/07/2023 1720   AMPHETMU NONE DETECTED 12/07/2023 1720   THCU NONE DETECTED 12/07/2023 1720   LABBARB NONE DETECTED 12/07/2023 1720    Alcohol Level     Component Value Date/Time   ETH <10 12/07/2023 1557   INR  Lab Results  Component Value Date   INR 1.2 12/07/2023   APTT  Lab Results  Component Value Date   APTT 28 12/07/2023   AED levels: No results found for: "PHENYTOIN", "ZONISAMIDE", "LAMOTRIGINE", "LEVETIRACETA"  CT angio Head and Neck with contrast(Personally reviewed): No LVO  MRI Brain(Personally reviewed): Small acute infarcts scattered in the left ACA and MCA distribution.   ASSESSMENT   Brittany Marks is a 83 y.o. 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  Suspect likely embolic secondary to hypercoagulable state from lung cancer. However, patient is hesitant about starting anticoagulation due to falls but is agreeable to Aspirin 81mg  daily.  RECOMMENDATIONS  - Frequent Neuro checks per stroke unit protocol - Recommend brain imaging with MRI Brain without contrast - Recommend obtaining TTE - Recommend obtaining Lipid panel with LDL - Please start statin if LDL > 70 - Recommend HbA1c to evaluate for diabetes and how well it is controlled. - Antithrombotic - Aspirin 81mg  daily. - Recommend DVT ppx - SBP goal - permissive hypertension first 24 h < 220/110. Held home meds.  - Recommend Telemetry monitoring for arrythmia - Recommend bedside swallow screen prior to PO intake. - Stroke education booklet - Recommend PT/OT/SLP consult  ______________________________________________________________________    Signed, Erick Blinks, MD [facility]

## 2023-12-08 NOTE — Progress Notes (Addendum)
 PROGRESS NOTE    Brittany Marks  ZOX:[id number] DOB: [birth year] DOA: 12/07/2023 PCP: Oneita Hurt, No    Brief Narrative:   Brittany Marks is a 83 y.o. 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 ED on 12/07/2023 via EMS from home with right shoulder pain following fall at home.  Currently lives alone.  Has had recurrent falls; with reported for [facility] on day of arrival.  Last fall was trying to sit down on her walker and which is slipped out from underneath her and fell forward on outstretched arm causing right shoulder pain.  Denies headache, no visual changes, no chest pain, no palpitations, no fever/chills, no paresthesias, no urinary symptoms.  Continues to endorse tobacco use.  Denies alcohol use.  In the ED, temperature 97.5 F, HR 86, RR 16, BP 166/95, SpO2 100% on 3 L nasal cannula which is her baseline.  WBC 13.7, hemoglobin 14.5, platelet count 264.  Sodium 132, potassium 3.1, chloride 97, CO2 22, glucose 115, BUN 29, creatinine 0.94.  AST 44, ALT 24.  Total bilirubin 0.7.  INR 1.2.  Urinalysis unrevealing.  UDS positive for opiates, otherwise negative.  Right shoulder x-ray with acute obliquely oriented fracture deformity involving the proximal shaft of the right humerus, advanced glenohumeral osteoarthritis.  MR brain with and without contrast with small acute infarct scattered in the left ACA/MCA distribution, advanced chronic small vessel disease.  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  Assessment & Plan:   Acute CVA Patient presenting to the ED following multiple recurrent falls at home. MR brain with and without contrast with small acute infarct scattered in the left ACA/MCA distribution, advanced chronic small vessel disease.  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  Concern acute CVA related to embolic nature from underlying malignancy.  EKG normal sinus rhythm. -- Neurology following, appreciate assistance -- LDL 103 -- Hemoglobin A1c: Pending -- TTE: 5.9 -- Atorvastatin 40 mg p.o. daily -- Aspirin 81 mg p.o. daily -- Monitor on telemetry -- PT/OT/SLP: Pending -- Anticipate likely need of SNF placement, TOC consulted  Right proximal humerus fracture Right shoulder x-ray with acute obliquely oriented fracture deformity involving the proximal shaft of the right humerus.  Seen by orthopedics, [name] who recommend outpatient follow-up with [name] in 1 week. -- NWB RUE with sling in place -- Tylenol as needed mild pain  Hypokalemia Potassium 3.1 admission, repleted. -- repeat electrolytes in the a.m.  History of small cell lung cancer with bony metastasis Follows with medical oncology outpatient, Dr. [name].  Currently on palliative chemotherapy with Imfinzi every 4 weeks.  Chronic hypoxic respiratory failure At baseline on 3 L nasal cannula.  Stable. -- Albuterol neb as needed for shortness of breath/wheezing  Neuropathy -- Gabapentin 100 mg p.o. twice daily  Tobacco use disorder Continues to endorse tobacco use daily.  Counseled on need for complete  cessation/abstinence -- Nicotine patch   DVT prophylaxis: enoxaparin (LOVENOX) injection 40 mg Start: 12/07/23 2200    Code Status: Limited: Do not attempt resuscitation (DNR) -DNR-LIMITED -Do Not Intubate/DNI  Family Communication: No family present at bedside this morning  Disposition Plan:  Level of care: Progressive Status is: Inpatient Remains inpatient appropriate because: Pending TTE, therapy evaluation, anticipate likely need of SNF placement    Consultants:  Neurology  Procedures:  TTE: Pending  Antimicrobials:  None   Subjective: Patient seen examined bedside, lying in bed.  Eating breakfast.  NT present at bedside.  Reports pain currently controlled.  No other specific complaints or concerns at this time.  Discussed with patient regarding acute stroke, humerus fracture and that she lives alone may need placement as she has no close family members able to support her in the local area.  Daughter lives in [place].  Patient with no other specific complaints, concerns or questions at this time.  Denies headache, no vision changes, no chest pain, no palpitations, no shortness of breath, no abdominal pain, no fever/chills/night sweats, no nausea/vomiting/diarrhea, no focal weakness, no fatigue, no paresthesias.  No acute events overnight per nursing.  Objective: Vitals:   12/07/23 2142 12/08/23 0123 12/08/23 0539 12/08/23 0800  BP: (!) 92/57 119/81 (!) 112/94 124/78  Pulse: 82 80 85 79  Resp: 15 20 18    Temp: 98.2 F (36.8 C) 98.2 F (36.8 C) 98.4 F (36.9 C) 98 F (36.7 C)  TempSrc: Oral Oral Oral Oral  SpO2: 92% 95% 94% 97%  Weight:      Height:        Intake/Output Summary (Last 24 hours) at 12/08/2023 1239 Last data filed at 12/08/2023 0945 Gross per 24 hour  Intake 360 ml  Output 130 ml  Net 230 ml   Filed Weights   12/07/23 0906  Weight: 63.6 kg    Examination:  Physical Exam: GEN: NAD, alert and oriented x 3, chronically ill appearance, appears  older than stated age HEENT: NCAT, PERRL, EOMI, sclera clear, MMM PULM: CTAB w/o wheezes/crackles, normal respiratory effort, on 3 L nasal cannula which is her baseline with SpO2 94% at rest CV: RRR w/o M/G/R GI: abd soft, NTND, NABS, no R/G/M MSK: no peripheral edema, right upper extremity in sling, neurovascular intact, otherwise moves all extremities independently NEURO: No focal neurological deficit PSYCH: normal mood/affect Integumentary: No concerning rashes/lesions/wounds noted on exposed skin surfaces    Data Reviewed: I have personally reviewed following labs and imaging studies  CBC: Recent Labs  Lab 12/03/23 1338 12/07/23 0954 12/07/23 1605  WBC 8.8 13.7*  --   NEUTROABS 6.7 11.5*  --   HGB 14.0 14.5 13.9  HCT 40.4 44.7 41.0  MCV 94.6 99.3  --   PLT 236 264  --    Basic Metabolic Panel: Recent Labs  Lab 12/03/23 1338 12/07/23 0954 12/07/23 1557 12/07/23 1605  NA 134* 132*  --  135  K 3.9 3.1*  --  4.2  CL 101 97*  --  100  CO2 26 22  --   --   GLUCOSE 104* 115*  --  123*  BUN 18 29*  --  25*  CREATININE 0.68 0.94  --  1.00  CALCIUM 9.2 8.5*  --   --   MG  --   --  2.0  --    GFR: Estimated Creatinine Clearance: 38  mL/min (by C-G formula based on SCr of 1 mg/dL). Liver Function Tests: Recent Labs  Lab 12/03/23 1338 12/07/23 1557  AST 20 44*  ALT 11 24  ALKPHOS 77 68  BILITOT 0.7 0.7  PROT 7.1 6.7  ALBUMIN 3.8 3.3*   No results for input(s): "LIPASE", "AMYLASE" in the last 168 hours. No results for input(s): "AMMONIA" in the last 168 hours. Coagulation Profile: Recent Labs  Lab 12/07/23 1557  INR 1.2   Cardiac Enzymes: No results for input(s): "CKTOTAL", "CKMB", "CKMBINDEX", "TROPONINI" in the last 168 hours. BNP (last 3 results) No results for input(s): "PROBNP" in the last 8760 hours. HbA1C: Recent Labs    12/07/23 1626  HGBA1C 5.9*   CBG: No results for input(s): "GLUCAP" in the last 168 hours. Lipid Profile: Recent Labs     12/08/23 0415  CHOL 173  HDL 53  LDLCALC 103*  TRIG 86  CHOLHDL 3.3   Thyroid Function Tests: Recent Labs    12/07/23 1630  TSH 1.847   Anemia Panel: Recent Labs    12/07/23 1630  VITAMINB12 820   Sepsis Labs: No results for input(s): "PROCALCITON", "LATICACIDVEN" in the last 168 hours.  No results found for this or any previous visit (from the past 240 hours).       Radiology Studies: CT ANGIO HEAD NECK W WO CM Result Date: 12/07/2023 CLINICAL DATA:  Acute left ACA and left caudate infarcts. Small cell lung cancer. EXAM: CT ANGIOGRAPHY HEAD AND NECK WITH AND WITHOUT CONTRAST TECHNIQUE: Multidetector CT imaging of the head and neck was performed using the standard protocol during bolus administration of intravenous contrast. Multiplanar CT image reconstructions and MIPs were obtained to evaluate the vascular anatomy. Carotid stenosis measurements (when applicable) are obtained utilizing NASCET criteria, using the distal internal carotid diameter as the denominator. RADIATION DOSE REDUCTION: This exam was performed according to the departmental dose-optimization program which includes automated exposure control, adjustment of the mA and/or kV according to patient size and/or use of iterative reconstruction technique. CONTRAST:  75mL OMNIPAQUE IOHEXOL 350 MG/ML SOLN COMPARISON:  MR head without and with contrast 12/07/2023. CT head without and with contrast 06/16/2019 FINDINGS: CT HEAD FINDINGS Brain: The acute ACA territory infarcts are below the resolution of CT. Advanced white matter disease is again noted. Remote left basal ganglia infarct is present. Deep gray nuclei are otherwise within normal limits. The ventricles are proportionate to the degree of atrophy. No significant extraaxial fluid collection is present. The brainstem and cerebellum are within normal limits. Midline structures are within normal limits. Vascular: Atherosclerotic calcifications are present within the  cavernous internal carotid arteries bilaterally and at the dural margin of the left vertebral artery. No hyperdense vessel is present. Skull: Calvarium is intact. No focal lytic or blastic lesions are present. No significant extracranial soft tissue lesion is present. Sinuses/Orbits: The paranasal sinuses and mastoid air cells are clear. Bilateral lens replacements are noted. Globes and orbits are otherwise unremarkable. Review of the MIP images confirms the above findings CTA NECK FINDINGS Aortic arch: Extensive atherosclerotic calcifications are present the aortic arch and great vessel origins. High-grade, near occlusive stenosis is present at the proximal left subclavian artery. No dissection or aneurysm is present. Right carotid system: The right common carotid artery demonstrates some mural calcification without focal stenosis. Dense atherosclerotic calcifications are present at the right carotid bifurcation lumen is narrowed to 1 mm. More distal cervical right ICA is within normal limits. Left carotid system: The left common carotid artery  demonstrates extensive mural calcifications without focal stenosis relative to the more distal vessel. Atherosclerotic calcifications are present at the bifurcation and proximal left ICA. Lumen is narrowed to 2 mm. The more distal cervical left ICA is of normal caliber. Mural calcification is present in the vessel just below the skull base without focal stenosis. Vertebral arteries: Right vertebral artery is dominant vessel. High-grade stenosis is present at the origin of the left vertebral artery. Calcifications are present in the left vertebral artery in the proximal left V2 segment without a tandem stenosis. Skeleton: Multilevel degenerative changes are present in the cervical spine. Uncovertebral spurring contributes to moderate foraminal stenosis bilaterally at C5-6. No focal osseous lesions are present. Other neck: The soft tissues of the neck are otherwise  unremarkable. Salivary glands are within normal limits. Thyroid is normal. No significant adenopathy is present. No focal mucosal or submucosal lesions are present. Upper chest: Post radiation changes are again noted in the left upper lobe. Paraseptal emphysematous changes are present bilaterally. Review of the MIP images confirms the above findings CTA HEAD FINDINGS Anterior circulation: Atherosclerotic calcifications are present within the cavernous internal carotid arteries bilaterally without significant stenosis through the ICA termini. The A1 and M1 segments are normal. The left A1 segment is dominant. The anterior communicating artery is patent. MCA bifurcations are normal bilaterally. The ACA and MCA branch vessels are normal bilaterally. Posterior circulation: Atherosclerotic calcifications are present at the dural margin of the left vertebral artery without focal stenosis. The PICA origins are visualized and normal. The vertebrobasilar junction basilar artery is normal. Both posterior cerebral arteries scratched at the superior cerebellar arteries are patent. Both posterior cerebral arteries originate from the basilar tip. Mild narrowing is present in the mid left P2 segment. Venous sinuses: The dural sinuses are patent. The straight sinus and deep cerebral veins are intact. Cortical veins are within normal limits. No significant vascular malformation is evident. Anatomic variants: None Review of the MIP images confirms the above findings IMPRESSION: 1. The acute left ACA territory infarcts are below the resolution of CT. 2. Advanced white matter disease likely reflects the sequela of chronic microvascular ischemia. 3. Remote left basal ganglia infarct. 4. 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 5. High-grade stenosis at the origin of the left vertebral artery. 6. Severe stenosis at the right carotid bifurcation with luminal narrowing of 1 mm. 7. Mild narrowing in the  mid left P2 segment. 8. No other significant proximal stenosis, aneurysm, or branch vessel occlusion within the Circle of Willis. 9. Post radiation changes in the left upper lobe. 10. Multilevel degenerative changes in the cervical spine. 11. Aortic Atherosclerosis (ICD10-I70.0) and Emphysema (ICD10-J43.9). Electronically Signed   By: Marin Roberts M.D.   On: 12/07/2023 17:31   MR BRAIN W WO CONTRAST Result Date: 12/07/2023 CLINICAL DATA:  Metastatic disease evaluation EXAM: MRI HEAD WITHOUT AND WITH CONTRAST TECHNIQUE: Multiplanar, multiecho pulse sequences of the brain and surrounding structures were obtained without and with intravenous contrast. CONTRAST:  6mL GADAVIST GADOBUTROL 1 MMOL/ML IV SOLN COMPARISON:  None Available. FINDINGS: Brain: Small foci of restricted diffusion in the anterior left temporal lobe, left caudate head, and parasagittal left frontal lobe. These span the left MCA and ACA distributions. Extensive chronic small vessel ischemia in the cerebral white matter with confluent FLAIR hyperintensity. 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 Small chronic right frontal microhemorrhage Vascular: Major flow voids and vascular enhancements are preserved. Skull and upper cervical spine: Normal marrow signal.  Degeneration with C2-3 facet ankylosis. Sinuses/Orbits: Negative IMPRESSION: Small acute infarcts scattered in the left ACA and MCA distribution. Advanced chronic small vessel disease. Electronically Signed   By: Tiburcio Pea M.D.   On: 12/07/2023 12:38   DG Humerus Right Result Date: 12/07/2023 CLINICAL DATA:  Status post fall.  Decreased mobility. EXAM: RIGHT HUMERUS - 2+ VIEW COMPARISON:  None Available. FINDINGS: There is an acute, obliquely oriented, fracture deformity involving the proximal shaft of the right humerus. There is lateral and posterior angulation of the distal fracture fragments. Advanced arthro pathic changes involving the right glenohumeral  joint. IMPRESSION: Acute, obliquely oriented, fracture deformity involving the proximal shaft of the right humerus. Electronically Signed   By: Signa Kell M.D.   On: 12/07/2023 10:26   DG Shoulder Right Result Date: 12/07/2023 CLINICAL DATA:  Fall.  Pain and decreased mobility. EXAM: RIGHT SHOULDER - 2+ VIEW COMPARISON:  None Available. FINDINGS: There is and acute, obliquely oriented fracture deformity involving the proximal shaft of the right humerus. There is posterior and lateral angulation of the distal fracture fragments. Advanced arthro pathic changes are noted involving the glenohumeral joint. Foreshortening of the right clavicle may be posttraumatic or postsurgical. IMPRESSION: 1. Acute, obliquely oriented fracture deformity involving the proximal shaft of the right humerus. 2. Advanced glenohumeral osteoarthritis. Electronically Signed   By: Signa Kell M.D.   On: 12/07/2023 10:24        Scheduled Meds:  aspirin EC  81 mg Oral Daily   atorvastatin  40 mg Oral Daily   enoxaparin (LOVENOX) injection  40 mg Subcutaneous QHS   gabapentin  100 mg Oral BID   multivitamin with minerals  1 tablet Oral Q breakfast   nicotine  14 mg Transdermal Daily   sodium chloride flush  3-10 mL Intravenous Q12H   Continuous Infusions:   LOS: 1 day    Time spent: 53 minutes spent on chart review, discussion with nursing staff, consultants, updating family and interview/physical exam; more than 50% of that time was spent in counseling and/or coordination of care.    Brittany Philips [place], DO [facility] Available via Epic secure chat 7am-7pm After these hours, please refer to coverage provider listed on [email or web address] 12/08/2023, 12:39 PM

## 2023-12-09 ENCOUNTER — Other Ambulatory Visit: Payer: Self-pay

## 2023-12-09 DIAGNOSIS — E785 Hyperlipidemia, unspecified: Secondary | ICD-10-CM | POA: Diagnosis not present

## 2023-12-09 DIAGNOSIS — I6523 Occlusion and stenosis of bilateral carotid arteries: Secondary | ICD-10-CM | POA: Diagnosis not present

## 2023-12-09 DIAGNOSIS — I6502 Occlusion and stenosis of left vertebral artery: Secondary | ICD-10-CM

## 2023-12-09 DIAGNOSIS — F17210 Nicotine dependence, cigarettes, uncomplicated: Secondary | ICD-10-CM | POA: Diagnosis not present

## 2023-12-09 DIAGNOSIS — I639 Cerebral infarction, unspecified: Secondary | ICD-10-CM | POA: Diagnosis not present

## 2023-12-09 DIAGNOSIS — I63422 Cerebral infarction due to embolism of left anterior cerebral artery: Secondary | ICD-10-CM | POA: Diagnosis not present

## 2023-12-09 LAB — COMPREHENSIVE METABOLIC PANEL
ALT: 20 U/L (ref 0–44)
AST: 33 U/L (ref 15–41)
Albumin: 3.2 g/dL — ABNORMAL LOW (ref 3.5–5.0)
Alkaline Phosphatase: 56 U/L (ref 38–126)
Anion gap: 8 (ref 5–15)
BUN: 21 mg/dL (ref 8–23)
CO2: 23 mmol/L (ref 22–32)
Calcium: 8.5 mg/dL — ABNORMAL LOW (ref 8.9–10.3)
Chloride: 104 mmol/L (ref 98–111)
Creatinine, Ser: 0.6 mg/dL (ref 0.44–1.00)
GFR, Estimated: >60 mL/min (ref >60)
Glucose, Bld: 85 mg/dL (ref 70–99)
Potassium: 4.1 mmol/L (ref 3.5–5.1)
Sodium: 135 mmol/L (ref 135–145)
Total Bilirubin: 1.1 mg/dL (ref 0.0–1.2)
Total Protein: 6.2 g/dL — ABNORMAL LOW (ref 6.5–8.1)

## 2023-12-09 LAB — CBC
HCT: 39.9 % (ref 36.0–46.0)
Hemoglobin: 13 g/dL (ref 12.0–15.0)
MCH: 32.3 pg (ref 26.0–34.0)
MCHC: 32.6 g/dL (ref 30.0–36.0)
MCV: 99 fL (ref 80.0–100.0)
Platelets: 217 10*3/uL (ref 150–400)
RBC: 4.03 MIL/uL (ref 3.87–5.11)
RDW: 13.7 % (ref 11.5–15.5)
WBC: 9.6 10*3/uL (ref 4.0–10.5)
nRBC: 0 % (ref 0.0–0.2)

## 2023-12-09 LAB — MAGNESIUM: Magnesium: 1.9 mg/dL (ref 1.7–2.4)

## 2023-12-09 NOTE — Progress Notes (Addendum)
 STROKE TEAM PROGRESS NOTE   SUBJECTIVE (INTERVAL HISTORY) No family at bedside.  Overall her condition is rapidly improving.  She still has right arm in sling, however both lower extremities muscle strength equal.  Denies any weakness on the right side lower extremity.   OBJECTIVE Temp:  [97.8 F (36.6 C)-98.6 F (37 C)] 98.5 F (36.9 C) (03/03 1301) Pulse Rate:  [74-83] 83 (03/03 1301) Cardiac Rhythm: Normal sinus rhythm (03/03 0800) Resp:  [16-18] 18 (03/03 1301) BP: (115-145)/(62-87) 123/62 (03/03 1301) SpO2:  [90 %-94 %] 93 % (03/03 1301)  No results for input(s): "GLUCAP" in the last 168 hours. Recent Labs  Lab 12/03/23 1338 12/07/23 0954 12/07/23 1557 12/07/23 1605 12/09/23 0406  NA 134* 132*  --  135 135  K 3.9 3.1*  --  4.2 4.1  CL 101 97*  --  100 104  CO2 26 22  --   --  23  GLUCOSE 104* 115*  --  123* 85  BUN 18 29*  --  25* 21  CREATININE 0.68 0.94  --  1.00 0.60  CALCIUM 9.2 8.5*  --   --  8.5*  MG  --   --  2.0  --  1.9   Recent Labs  Lab 12/03/23 1338 12/07/23 1557 12/09/23 0406  AST 20 44* 33  ALT 11 24 20   ALKPHOS 77 68 56  BILITOT 0.7 0.7 1.1  PROT 7.1 6.7 6.2*  ALBUMIN 3.8 3.3* 3.2*   Recent Labs  Lab 12/03/23 1338 12/07/23 0954 12/07/23 1605 12/09/23 0859  WBC 8.8 13.7*  --  9.6  NEUTROABS 6.7 11.5*  --   --   HGB 14.0 14.5 13.9 13.0  HCT 40.4 44.7 41.0 39.9  MCV 94.6 99.3  --  99.0  PLT 236 264  --  217   No results for input(s): "CKTOTAL", "CKMB", "CKMBINDEX", "TROPONINI" in the last 168 hours. Recent Labs    12/07/23 1557  LABPROT 15.7*  INR 1.2   Recent Labs    12/07/23 1719  COLORURINE YELLOW  LABSPEC 1.023  PHURINE 5.0  GLUCOSEU NEGATIVE  HGBUR NEGATIVE  BILIRUBINUR NEGATIVE  KETONESUR NEGATIVE  PROTEINUR NEGATIVE  NITRITE NEGATIVE  LEUKOCYTESUR TRACE*       Component Value Date/Time   CHOL 173 12/08/2023 0415   TRIG 86 12/08/2023 0415   HDL 53 12/08/2023 0415   CHOLHDL 3.3 12/08/2023 0415   VLDL 17  12/08/2023 0415   LDLCALC 103 (H) 12/08/2023 0415   Lab Results  Component Value Date   HGBA1C 5.9 (H) 12/07/2023      Component Value Date/Time   LABOPIA POSITIVE (A) 12/07/2023 1720   COCAINSCRNUR NONE DETECTED 12/07/2023 1720   LABBENZ NONE DETECTED 12/07/2023 1720   AMPHETMU NONE DETECTED 12/07/2023 1720   THCU NONE DETECTED 12/07/2023 1720   LABBARB NONE DETECTED 12/07/2023 1720    Recent Labs  Lab 12/07/23 1557  ETH <10    I have personally reviewed the radiological images below and agree with the radiology interpretations.  VAS [place] LOWER EXTREMITY VENOUS (DVT) Result Date: 12/08/2023  Lower Venous DVT Study Patient Name:  Brittany Marks  Date of Exam:   12/08/2023 Medical Rec #: [id number]          Accession #:    [id number] Date of Birth: 03/11/1941          Patient Gender: F Patient Age:   55 years Exam Location:  [facility] Procedure:  VAS [place] LOWER EXTREMITY VENOUS (DVT) Referring Phys: Scheryl Marten Nitasha Jewel --------------------------------------------------------------------------------  Indications: Stroke, and Embolic stroke, lung cancer, recent fall/injury.  Comparison Study: No prior exam. Performing Technologist: Fernande Bras  Examination Guidelines: A complete evaluation includes B-mode imaging, spectral Doppler, color Doppler, and power Doppler as needed of all accessible portions of each vessel. Bilateral testing is considered an integral part of a complete examination. Limited examinations for reoccurring indications may be performed as noted. The reflux portion of the exam is performed with the patient in reverse Trendelenburg.  +---------+---------------+---------+-----------+----------+--------------+ RIGHT    CompressibilityPhasicitySpontaneityPropertiesThrombus Aging +---------+---------------+---------+-----------+----------+--------------+ CFV      Full           Yes      Yes                                  +---------+---------------+---------+-----------+----------+--------------+ SFJ      Full           Yes      Yes                                 +---------+---------------+---------+-----------+----------+--------------+ FV Prox  Full                                                        +---------+---------------+---------+-----------+----------+--------------+ FV Mid   Full                                                        +---------+---------------+---------+-----------+----------+--------------+ FV DistalFull                                                        +---------+---------------+---------+-----------+----------+--------------+ PFV      Full                                                        +---------+---------------+---------+-----------+----------+--------------+ POP      Full           Yes      Yes                                 +---------+---------------+---------+-----------+----------+--------------+ PTV      Full                                                        +---------+---------------+---------+-----------+----------+--------------+ PERO     Full                                                        +---------+---------------+---------+-----------+----------+--------------+   +---------+---------------+---------+-----------+----------+--------------+  LEFT     CompressibilityPhasicitySpontaneityPropertiesThrombus Aging +---------+---------------+---------+-----------+----------+--------------+ CFV      Full           Yes      Yes                                 +---------+---------------+---------+-----------+----------+--------------+ SFJ      Full           Yes      Yes                                 +---------+---------------+---------+-----------+----------+--------------+ FV Prox  Full                                                         +---------+---------------+---------+-----------+----------+--------------+ FV Mid   Full                                                        +---------+---------------+---------+-----------+----------+--------------+ FV DistalFull                                                        +---------+---------------+---------+-----------+----------+--------------+ PFV      Full                                                        +---------+---------------+---------+-----------+----------+--------------+ POP      Full           Yes      Yes                                 +---------+---------------+---------+-----------+----------+--------------+ PTV      Full                                                        +---------+---------------+---------+-----------+----------+--------------+ PERO     Full                                                        +---------+---------------+---------+-----------+----------+--------------+     Summary: BILATERAL: - No evidence of deep vein thrombosis seen in the lower extremities, bilaterally. -No evidence of popliteal cyst, bilaterally.   *See table(s) above for measurements and observations. Electronically signed by Coral Else MD on 12/08/2023 at 9:28:12 PM.    Final    ECHOCARDIOGRAM  COMPLETE Result Date: 12/08/2023    ECHOCARDIOGRAM REPORT   Patient Name:   Brittany Marks Date of Exam: 12/08/2023 Medical Rec #:  [id number]         Height:       62.0 in Accession #:    [id number]        Weight:       140.3 lb Date of Birth:  1941-06-18         BSA:          1.644 m Patient Age:    82 years          BP:           112/94 mmHg Patient Gender: F                 HR:           80 bpm. Exam Location:  Inpatient Procedure: 2D Echo, Cardiac Doppler and Color Doppler (Both Spectral and Color            Flow Doppler were utilized during procedure). Indications:    Stroke  History:        Patient has no prior history of Echocardiogram  examinations.                 Cancer; Risk Factors:Former Smoker.  Sonographer:    Amy Chionchio Referring Phys: [id number] ALLISON WOLFE IMPRESSIONS  1. Limited windows. Left ventricular ejection fraction, by estimation, is 40 to 45%. The left ventricle has mildly decreased function. The left ventricle demonstrates global hypokinesis. There is mild left ventricular hypertrophy. Left ventricular diastolic parameters are consistent with Grade II diastolic dysfunction (pseudonormalization).  2. Right ventricular systolic function is mildly reduced. The right ventricular size is normal. There is normal pulmonary artery systolic pressure. The estimated right ventricular systolic pressure is 24.0 mmHg.  3. Left atrial size was severely dilated.  4. Mild mitral valve regurgitation.  5. The aortic valve was not well visualized. Aortic valve regurgitation is not visualized.  6. The inferior vena cava is normal in size with greater than 50% respiratory variability, suggesting right atrial pressure of 3 mmHg. FINDINGS  Left Ventricle: Limited windows. Left ventricular ejection fraction, by estimation, is 40 to 45%. The left ventricle has mildly decreased function. The left ventricle demonstrates global hypokinesis. Strain imaging was not performed. The left ventricular internal cavity size was normal in size. There is mild left ventricular hypertrophy. Left ventricular diastolic parameters are consistent with Grade II diastolic dysfunction (pseudonormalization). Right Ventricle: The right ventricular size is normal. Right ventricular systolic function is mildly reduced. There is normal pulmonary artery systolic pressure. The tricuspid regurgitant velocity is 2.29 m/s, and with an assumed right atrial pressure of  3 mmHg, the estimated right ventricular systolic pressure is 24.0 mmHg. Left Atrium: Left atrial size was severely dilated. Right Atrium: Right atrial size was normal in size. Pericardium: There is no evidence of  pericardial effusion. Mitral Valve: Mild mitral valve regurgitation. MV peak gradient, 10.1 mmHg. The mean mitral valve gradient is 5.0 mmHg. Tricuspid Valve: Tricuspid valve regurgitation is mild. Aortic Valve: The aortic valve was not well visualized. Aortic valve regurgitation is not visualized. Aortic valve mean gradient measures 7.0 mmHg. Aortic valve peak gradient measures 10.2 mmHg. Aortic valve area, by VTI measures 1.15 cm. Pulmonic Valve: Pulmonic valve regurgitation is not visualized. Aorta: The aortic root and ascending aorta are structurally normal, with no evidence of dilitation. Venous: The inferior vena cava is normal in  size with greater than 50% respiratory variability, suggesting right atrial pressure of 3 mmHg. IAS/Shunts: No atrial level shunt detected by color flow Doppler. Additional Comments: 3D imaging was not performed.  LEFT VENTRICLE PLAX 2D LVIDd:         4.80 cm     Diastology LVIDs:         4.10 cm     LV e' medial:    5.33 cm/s LV PW:         0.90 cm     LV E/e' medial:  27.4 LV IVS:        1.10 cm     LV e' lateral:   9.68 cm/s LVOT diam:     2.00 cm     LV E/e' lateral: 15.1 LV SV:         40 LV SV Index:   24 LVOT Area:     3.14 cm  LV Volumes (MOD) LV vol d, MOD A2C: 86.1 ml LV vol d, MOD A4C: 88.3 ml LV vol s, MOD A2C: 44.8 ml LV vol s, MOD A4C: 46.4 ml LV SV MOD A2C:     41.3 ml LV SV MOD A4C:     88.3 ml LV SV MOD BP:      44.5 ml RIGHT VENTRICLE            IVC RV Basal diam:  3.50 cm    IVC diam: 1.60 cm RV S prime:     5.87 cm/s TAPSE (M-mode): 0.9 cm LEFT ATRIUM              Index        RIGHT ATRIUM           Index LA Vol (A2C):   85.4 ml  51.94 ml/m  RA Area:     13.60 cm LA Vol (A4C):   101.0 ml 61.42 ml/m  RA Volume:   32.50 ml  19.77 ml/m LA Biplane Vol: 94.7 ml  57.59 ml/m  AORTIC VALVE                     PULMONIC VALVE AV Area (Vmax):    1.19 cm      PV Vmax:       0.63 m/s AV Area (Vmean):   0.99 cm      PV Peak grad:  1.6 mmHg AV Area (VTI):     1.15 cm  AV Vmax:           160.00 cm/s AV Vmean:          125.000 cm/s AV VTI:            0.350 m AV Peak Grad:      10.2 mmHg AV Mean Grad:      7.0 mmHg LVOT Vmax:         60.60 cm/s LVOT Vmean:        39.300 cm/s LVOT VTI:          0.128 m LVOT/AV VTI ratio: 0.37  AORTA Ao Root diam: 2.70 cm Ao Asc diam:  3.00 cm MITRAL VALVE                TRICUSPID VALVE MV Area (PHT): 4.19 cm     TR Peak grad:   21.0 mmHg MV Area VTI:   1.11 cm     TR Vmax:        229.00 cm/s MV Peak grad:  10.1 mmHg MV Mean  grad:  5.0 mmHg     SHUNTS MV Vmax:       1.59 m/s     Systemic VTI:  0.13 m MV Vmean:      111.0 cm/s   Systemic Diam: 2.00 cm MV Decel Time: 181 msec MV E velocity: 146.00 cm/s MV A velocity: 115.00 cm/s MV E/A ratio:  1.27 Carolan Clines Electronically signed by Carolan Clines Signature Date/Time: 12/08/2023/2:11:03 PM    Final    CT ANGIO HEAD NECK W WO CM Result Date: 12/07/2023 CLINICAL DATA:  Acute left ACA and left caudate infarcts. Small cell lung cancer. EXAM: CT ANGIOGRAPHY HEAD AND NECK WITH AND WITHOUT CONTRAST TECHNIQUE: Multidetector CT imaging of the head and neck was performed using the standard protocol during bolus administration of intravenous contrast. Multiplanar CT image reconstructions and MIPs were obtained to evaluate the vascular anatomy. Carotid stenosis measurements (when applicable) are obtained utilizing NASCET criteria, using the distal internal carotid diameter as the denominator. RADIATION DOSE REDUCTION: This exam was performed according to the departmental dose-optimization program which includes automated exposure control, adjustment of the mA and/or kV according to patient size and/or use of iterative reconstruction technique. CONTRAST:  75mL OMNIPAQUE IOHEXOL 350 MG/ML SOLN COMPARISON:  MR head without and with contrast 12/07/2023. CT head without and with contrast 06/16/2019 FINDINGS: CT HEAD FINDINGS Brain: The acute ACA territory infarcts are below the resolution of CT. Advanced white matter  disease is again noted. Remote left basal ganglia infarct is present. Deep gray nuclei are otherwise within normal limits. The ventricles are proportionate to the degree of atrophy. No significant extraaxial fluid collection is present. The brainstem and cerebellum are within normal limits. Midline structures are within normal limits. Vascular: Atherosclerotic calcifications are present within the cavernous internal carotid arteries bilaterally and at the dural margin of the left vertebral artery. No hyperdense vessel is present. Skull: Calvarium is intact. No focal lytic or blastic lesions are present. No significant extracranial soft tissue lesion is present. Sinuses/Orbits: The paranasal sinuses and mastoid air cells are clear. Bilateral lens replacements are noted. Globes and orbits are otherwise unremarkable. Review of the MIP images confirms the above findings CTA NECK FINDINGS Aortic arch: Extensive atherosclerotic calcifications are present the aortic arch and great vessel origins. High-grade, near occlusive stenosis is present at the proximal left subclavian artery. No dissection or aneurysm is present. Right carotid system: The right common carotid artery demonstrates some mural calcification without focal stenosis. Dense atherosclerotic calcifications are present at the right carotid bifurcation lumen is narrowed to 1 mm. More distal cervical right ICA is within normal limits. Left carotid system: The left common carotid artery demonstrates extensive mural calcifications without focal stenosis relative to the more distal vessel. Atherosclerotic calcifications are present at the bifurcation and proximal left ICA. Lumen is narrowed to 2 mm. The more distal cervical left ICA is of normal caliber. Mural calcification is present in the vessel just below the skull base without focal stenosis. Vertebral arteries: Right vertebral artery is dominant vessel. High-grade stenosis is present at the origin of the left  vertebral artery. Calcifications are present in the left vertebral artery in the proximal left V2 segment without a tandem stenosis. Skeleton: Multilevel degenerative changes are present in the cervical spine. Uncovertebral spurring contributes to moderate foraminal stenosis bilaterally at C5-6. No focal osseous lesions are present. Other neck: The soft tissues of the neck are otherwise unremarkable. Salivary glands are within normal limits. Thyroid is normal. No significant adenopathy is present.  No focal mucosal or submucosal lesions are present. Upper chest: Post radiation changes are again noted in the left upper lobe. Paraseptal emphysematous changes are present bilaterally. Review of the MIP images confirms the above findings CTA HEAD FINDINGS Anterior circulation: Atherosclerotic calcifications are present within the cavernous internal carotid arteries bilaterally without significant stenosis through the ICA termini. The A1 and M1 segments are normal. The left A1 segment is dominant. The anterior communicating artery is patent. MCA bifurcations are normal bilaterally. The ACA and MCA branch vessels are normal bilaterally. Posterior circulation: Atherosclerotic calcifications are present at the dural margin of the left vertebral artery without focal stenosis. The PICA origins are visualized and normal. The vertebrobasilar junction basilar artery is normal. Both posterior cerebral arteries scratched at the superior cerebellar arteries are patent. Both posterior cerebral arteries originate from the basilar tip. Mild narrowing is present in the mid left P2 segment. Venous sinuses: The dural sinuses are patent. The straight sinus and deep cerebral veins are intact. Cortical veins are within normal limits. No significant vascular malformation is evident. Anatomic variants: None Review of the MIP images confirms the above findings IMPRESSION: 1. The acute left ACA territory infarcts are below the resolution of CT.  2. Advanced white matter disease likely reflects the sequela of chronic microvascular ischemia. 3. Remote left basal ganglia infarct. 4. 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 5. High-grade stenosis at the origin of the left vertebral artery. 6. Severe stenosis at the right carotid bifurcation with luminal narrowing of 1 mm. 7. Mild narrowing in the mid left P2 segment. 8. No other significant proximal stenosis, aneurysm, or branch vessel occlusion within the Circle of Willis. 9. Post radiation changes in the left upper lobe. 10. Multilevel degenerative changes in the cervical spine. 11. Aortic Atherosclerosis (ICD10-I70.0) and Emphysema (ICD10-J43.9). Electronically Signed   By: Marin Roberts M.D.   On: 12/07/2023 17:31   MR BRAIN W WO CONTRAST Result Date: 12/07/2023 CLINICAL DATA:  Metastatic disease evaluation EXAM: MRI HEAD WITHOUT AND WITH CONTRAST TECHNIQUE: Multiplanar, multiecho pulse sequences of the brain and surrounding structures were obtained without and with intravenous contrast. CONTRAST:  6mL GADAVIST GADOBUTROL 1 MMOL/ML IV SOLN COMPARISON:  None Available. FINDINGS: Brain: Small foci of restricted diffusion in the anterior left temporal lobe, left caudate head, and parasagittal left frontal lobe. These span the left MCA and ACA distributions. Extensive chronic small vessel ischemia in the cerebral white matter with confluent FLAIR hyperintensity. 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 Small chronic right frontal microhemorrhage Vascular: Major flow voids and vascular enhancements are preserved. Skull and upper cervical spine: Normal marrow signal. Degeneration with C2-3 facet ankylosis. Sinuses/Orbits: Negative IMPRESSION: Small acute infarcts scattered in the left ACA and MCA distribution. Advanced chronic small vessel disease. Electronically Signed   By: Tiburcio Pea M.D.   On: 12/07/2023 12:38   DG Humerus Right Result  Date: 12/07/2023 CLINICAL DATA:  Status post fall.  Decreased mobility. EXAM: RIGHT HUMERUS - 2+ VIEW COMPARISON:  None Available. FINDINGS: There is an acute, obliquely oriented, fracture deformity involving the proximal shaft of the right humerus. There is lateral and posterior angulation of the distal fracture fragments. Advanced arthro pathic changes involving the right glenohumeral joint. IMPRESSION: Acute, obliquely oriented, fracture deformity involving the proximal shaft of the right humerus. Electronically Signed   By: Signa Kell M.D.   On: 12/07/2023 10:26   DG Shoulder Right Result Date: 12/07/2023 CLINICAL DATA:  Fall.  Pain  and decreased mobility. EXAM: RIGHT SHOULDER - 2+ VIEW COMPARISON:  None Available. FINDINGS: There is and acute, obliquely oriented fracture deformity involving the proximal shaft of the right humerus. There is posterior and lateral angulation of the distal fracture fragments. Advanced arthro pathic changes are noted involving the glenohumeral joint. Foreshortening of the right clavicle may be posttraumatic or postsurgical. IMPRESSION: 1. Acute, obliquely oriented fracture deformity involving the proximal shaft of the right humerus. 2. Advanced glenohumeral osteoarthritis. Electronically Signed   By: Signa Kell M.D.   On: 12/07/2023 10:24     PHYSICAL EXAM  Temp:  [97.8 F (36.6 C)-98.6 F (37 C)] 98.5 F (36.9 C) (03/03 1301) Pulse Rate:  [74-83] 83 (03/03 1301) Resp:  [16-18] 18 (03/03 1301) BP: (115-145)/(62-87) 123/62 (03/03 1301) SpO2:  [90 %-94 %] 93 % (03/03 1301)  General - Well nourished, well developed, in no apparent distress.  Ophthalmologic - fundi not visualized due to noncooperation.  Cardiovascular - Regular rhythm and rate.  Mental Status -  Level of arousal and orientation to time, place, and person were intact. Language including expression, naming, repetition, comprehension was assessed and found intact. Fund of Knowledge was  assessed and was intact.  Cranial Nerves II - XII - II - Visual field intact OU. III, IV, VI - Extraocular movements intact. V - Facial sensation intact bilaterally. VII - Facial movement intact bilaterally. VIII - Hearing & vestibular intact bilaterally. X - Palate elevates symmetrically. XI - Chin turning & shoulder shrug intact bilaterally. XII - Tongue protrusion intact.  Motor Strength - The patient's strength exam showed left upper extremity limited shoulder ROM, chronic, however bicep and tricep handgrip 5/5.  Right upper remedy in sling, however handgrip 5/5.  Bilateral lower extremity 5/5.  Bulk was normal and fasciculations were absent.   Motor Tone - Muscle tone was assessed at the neck and appendages and was normal.  Reflexes - The patient's reflexes were symmetrical in all extremities and she had no pathological reflexes.  Sensory - Light touch, temperature/pinprick were assessed and were symmetrical.    Coordination - The patient had normal movements in the L FTN with no ataxia or dysmetria.  Tremor was absent.  Gait and Station - deferred.   ASSESSMENT/PLAN Ms. Brittany Marks is a 83 y.o. 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 No TNK given due to outside window.    Stroke:  left ACA scattered subacute and acute infarcts, as well as 2 punctate left BG/caudate head infarcts embolic likely secondary to hypercoagulable state from advanced malignancy  CT no acute finding, left BG old infarct CT head and neck near occlusion left subclavian artery and brachial cephalic artery, high-grade stenosis left VA origin, bilateral carotid bulb severe stenosis, left CCA midportion and right CCA proximal atherosclerosis. MRI subacute/acute left ACA infarcts, and 2 punctate left BG/caudate head infarcts. 2D Echo EF 40 to 45% LE venous Doppler no DVT LDL  103 HgbA1c 5.9 UDS positive for opiates Lovenox for VTE prophylaxis No antithrombotic prior to admission, now on aspirin 81 mg daily.  Patient reluctant with anticoagulation at this time.  Continue aspirin on discharge Ongoing aggressive stroke risk factor management Therapy recommendations: SNF Disposition: Pending  Intracranial and extracranial stenosis CT head and neck near occlusion left subclavian artery and brachial cephalic artery, high-grade stenosis left VA origin, bilateral carotid bulb severe stenosis, left CCA  midportion and right CCA proximal atherosclerosis. 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 Please call with questions. Pt will follow up with stroke clinic NP at [facility] in about 4-6 weeks. Thanks for the consult.   Marvel Plan, MD PhD Stroke Neurology 12/09/2023 7:06 PM    To contact Stroke Continuity provider, please refer to [email or web address]. After hours, contact General Neurology `

## 2023-12-09 NOTE — Plan of Care (Signed)

## 2023-12-09 NOTE — NC FL2 (Signed)
 [place] MEDICAID FL2 LEVEL OF CARE FORM     IDENTIFICATION  Patient Name: HETAL PROANO Birthdate: 1941-02-14 Sex: female Admission Date (Current Location): 12/07/2023  [facility] and [place] Number:  Producer, television/film/video and Address:  [facility],  [address] [place], [place] [address]      Provider Number: [id number]  Attending Physician Name and Address:  [place], Eric J, DO  Relative Name and Phone Number:  Vassie Moselle (Daughter)  [phone] [facility])    Current Level of Care: Hospital Recommended Level of Care: Skilled Nursing Facility Prior Approval Number:    Date Approved/Denied:   PASRR Number: [id number]  Discharge Plan: SNF    Current Diagnoses: Patient Active Problem List   Diagnosis Date Noted   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   Acute CVA (cerebrovascular accident) (HCC) 12/07/2023   Closed right humeral fracture 12/07/2023   Hypokalemia 12/07/2023   Tobacco abuse 12/07/2023   Chronic respiratory failure with hypoxia (HCC) 12/07/2023   Leukocytosis 12/07/2023   Recurrent falls 12/07/2023   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   S/P reverse total shoulder arthroplasty, 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   Port-A-Cath in place 05/25/2019   Small cell lung cancer, 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   Goals of care, counseling/discussion 03/10/2019   Encounter for antineoplastic chemotherapy 03/10/2019   Encounter for antineoplastic immunotherapy 03/10/2019   Pathologic fracture of right humerus 02/25/2019   Lymphadenopathy, abdominal 01/02/2019   Lymphadenopathy, mediastinal 01/02/2019   Pathological fracture, left humerus, initial encounter for fracture 12/26/2018   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    Orientation RESPIRATION BLADDER Height & Weight     Self, Time, Place  O2 (3) Continent Weight: 140 lb 4.8 oz (63.6 kg) Height:  5\' 2"  (157.5 cm)  BEHAVIORAL SYMPTOMS/MOOD NEUROLOGICAL  BOWEL NUTRITION STATUS      Continent Diet (regular)  AMBULATORY STATUS COMMUNICATION OF NEEDS Skin   Limited Assist Verbally Normal                       Personal Care Assistance Level of Assistance  Bathing, Dressing, Feeding Bathing Assistance: Limited assistance Feeding assistance: Independent Dressing Assistance: Limited assistance     Functional Limitations Info  Sight, Hearing, Speech Sight Info: Adequate Hearing Info: Adequate Speech Info: Adequate    SPECIAL CARE FACTORS FREQUENCY  OT (By licensed OT), PT (By licensed PT)     PT Frequency: 5 x a week OT Frequency: 5 x a week            Contractures Contractures Info: Not present    Additional Factors Info  Code Status, Allergies Code Status Info: DNR Allergies Info: Oxycodone           Current Medications (12/09/2023):  This is the current hospital active medication list Current Facility-Administered Medications  Medication Dose Route Frequency Provider Last Rate Last Admin   acetaminophen (TYLENOL) tablet 650 mg  650 mg Oral Q4H PRN Orland Mustard, MD       Or   acetaminophen (TYLENOL) 160 MG/5ML solution 650 mg  650 mg Per Tube Q4H PRN Orland Mustard, MD       Or   acetaminophen (TYLENOL) suppository 650 mg  650 mg Rectal Q4H PRN Orland Mustard, MD       albuterol (PROVENTIL) (2.5 MG/3ML) 0.083% nebulizer solution 2.5 mg  2.5 mg Nebulization Q6H PRN Orland Mustard, MD       aspirin EC tablet 81 mg  81 mg Oral  Daily [place], Eric J, DO   81 mg at 12/09/23 0820   atorvastatin (LIPITOR) tablet 40 mg  40 mg Oral Daily [place], Eric J, DO   40 mg at 12/09/23 0820   enoxaparin (LOVENOX) injection 40 mg  40 mg Subcutaneous QHS Orland Mustard, MD   40 mg at 12/08/23 2132   gabapentin (NEURONTIN) capsule 100 mg  100 mg Oral BID Orland Mustard, MD   100 mg at 12/09/23 0820   lip balm (CARMEX) ointment   Topical PRN [place], Eric J, DO   Given at 12/08/23 7829   morphine (PF) 2 MG/ML injection 1 mg  1 mg  Intravenous Q3H PRN Orland Mustard, MD       multivitamin with minerals tablet 1 tablet  1 tablet Oral Q breakfast Orland Mustard, MD   1 tablet at 12/09/23 0820   nicotine (NICODERM CQ - dosed in mg/24 hours) patch 14 mg  14 mg Transdermal Daily Orland Mustard, MD   14 mg at 12/09/23 5621   senna-docusate (Senokot-S) tablet 1 tablet  1 tablet Oral QHS PRN Orland Mustard, MD       sodium chloride flush (NS) 0.9 % injection 3-10 mL  3-10 mL Intravenous Q12H Orland Mustard, MD   10 mL at 12/08/23 2200   sodium chloride flush (NS) 0.9 % injection 3-10 mL  3-10 mL Intravenous PRN Orland Mustard, MD       Facility-Administered Medications Ordered in Other Encounters  Medication Dose Route Frequency Provider Last Rate Last Admin   heparin lock flush 100 unit/mL  500 Units Intracatheter Once PRN Magrinat, Valentino Hue, MD       sodium chloride flush (NS) 0.9 % injection 10 mL  10 mL Intracatheter PRN Magrinat, Valentino Hue, MD         Discharge Medications: Please see discharge summary for a list of discharge medications.  Relevant Imaging Results:  Relevant Lab Results:   Additional Information [id number]  Valentina Shaggy Kaeleigh Westendorf, LCSW

## 2023-12-09 NOTE — Evaluation (Signed)
 Speech Language Pathology Evaluation Patient Details Name: Brittany Marks MRN: [id number] DOB: 1941/03/01 Today's Date: 12/09/2023 Time: 1220-1240 SLP Time Calculation (min) (ACUTE ONLY): 20 min  Problem List:  Patient Active Problem List   Diagnosis Date Noted   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   Acute CVA (cerebrovascular accident) (HCC) 12/07/2023   Closed right humeral fracture 12/07/2023   Hypokalemia 12/07/2023   Tobacco abuse 12/07/2023   Chronic respiratory failure with hypoxia (HCC) 12/07/2023   Leukocytosis 12/07/2023   Recurrent falls 12/07/2023   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   S/P reverse total shoulder arthroplasty, 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   Port-A-Cath in place 05/25/2019   Small cell lung cancer, 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   Goals of care, counseling/discussion 03/10/2019   Encounter for antineoplastic chemotherapy 03/10/2019   Encounter for antineoplastic immunotherapy 03/10/2019   Pathologic fracture of right humerus 02/25/2019   Lymphadenopathy, abdominal 01/02/2019   Lymphadenopathy, mediastinal 01/02/2019   Pathological fracture, left humerus, initial encounter for fracture 12/26/2018   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   Past Medical History:  Past Medical History:  Diagnosis Date   Acute CVA (cerebrovascular accident) (HCC) 12/07/2023   Cataract    Bilateral   Constipation    pain medication   Left rotator cuff tear    Right rotator cuff tear    SCL CA dx'd 11/2018   Past Surgical History:  Past Surgical History:  Procedure Laterality Date   CATARACT EXTRACTION W/ INTRAOCULAR LENS  IMPLANT, BILATERAL  2012   DECOMPRESSIVE LUMBAR LAMINECTOMY LEVEL 2 N/A 07/29/2013   Procedure: LUMBAR LAMINECTOMY, DECOMPRESSION LUMBAR THREE TO FOUR, FOUR TO FIVE microdiscectomy l3,4 right;  Surgeon: Jacki Cones, MD;  Location: [facility];  Service: Orthopedics;  Laterality: N/A;   I & D SUPERIOR  RIGHT SHOULDER AND CLOSURE WOUND  01-10-2011   S/P ROTATOR CUFF REPAIR   IR IMAGING GUIDED PORT INSERTION  04/09/2019   LUMBAR LAMINECTOMY  1970'S   LUMBAR LAMINECTOMY/DECOMPRESSION MICRODISCECTOMY N/A 06/27/2016   Procedure: L1 - L2 DISCECTOMY;  Surgeon: Venita Lick, MD;  Location: [facility] OR;  Service: Orthopedics;  Laterality: N/A;   LUMBAR SPINE SURGERY  1983   REVERSE SHOULDER ARTHROPLASTY Left 03/10/2020   Procedure: REVERSE SHOULDER ARTHROPLASTY;  Surgeon: Francena Hanly, MD;  Location: [facility];  Service: Orthopedics;  Laterality: Left;    RIGHT SHOULDER ARTHROSCOPY/ OPEN DISTAL CLAVICLE RESECTION/ SAD/ OPEN ROTATOR CUFF REPAIR  11-28-2010   SHOULDER FUSION SURGERY  03/11/2020   Shoulder Surgery , hardware placed   SHOULDER OPEN ROTATOR CUFF REPAIR  12/19/2011   Procedure: ROTATOR CUFF REPAIR SHOULDER OPEN;  Surgeon: Drucilla Schmidt, MD;  Location: [facility];  Service: Orthopedics;  Laterality: Right;  RIGHT RECURRENT OPEN REPAIR OF THE ROTATOR CUFF WITH TISSUE MEND GRAFTANTERIOR CHROMIOECTOMY   VAGINAL HYSTERECTOMY  1979   HPI:  Patient is an 83 y.o. female with PMH: small cell lung cancer with metastasis to bone, on palliative chemotherapy, chemo induced neuropathy, chronic respiratory failure on 3L oxygen via nasal cannula at baseline. She presented to the hospital on 12/07/23 via EMS with right shoulder pain following a fall at home. UA unrevealing, UDS positive for opiates but otherwise negative. Right shoulder x-ray with acute obliquely oriented fracture deformity involving the proximal shaft of the right humerus, advanced glenohumeral osteoarthritis.  MR brain with and without contrast with small acute infarct scattered in the left ACA/MCA distribution, advanced chronic small vessel disease.   Assessment /  Plan / Recommendation Clinical Impression  Patient is presenting with a questionable impairment of cognition, however she is currently fairly apathetic, frustrated  about being in the hospital and being unable to move arms much and her ability and willingness to fully participate in cognitive evaluation was limited. She was oriented to year, month, place, situation. She acknowledged that she will have to go to a rehab place prior to returning home, but she does not want to do this. When asked about her activity level at home, she reported that she sits and watches TV most of the time and has a cat she cares for. She told SLP that she no longer is able to perform her hobbies of yardwork and sewing. SLP is recommending skilled services at next venue of care to ensure that she is able to perform complex problem solving tasks/ADL's.    SLP Assessment  SLP Recommendation/Assessment: All further Speech Lanaguage Pathology  needs can be addressed in the next venue of care SLP Visit Diagnosis: Cognitive communication deficit (R41.841)    Recommendations for follow up therapy are one component of a multi-disciplinary discharge planning process, led by the attending physician.  Recommendations may be updated based on patient status, additional functional criteria and insurance authorization.    Follow Up Recommendations  Skilled nursing-short term rehab (<3 hours/day)    Assistance Recommended at Discharge  Intermittent Supervision/Assistance  Functional Status Assessment Patient has had a recent decline in their functional status and demonstrates the ability to make significant improvements in function in a reasonable and predictable amount of time.  Frequency and Duration           SLP Evaluation Cognition  Overall Cognitive Status: No family/caregiver present to determine baseline cognitive functioning Arousal/Alertness: Awake/alert Orientation Level: Oriented to person;Oriented to place;Oriented to situation;Other (comment) (oriented to year and month not DOW or date) Year: 2025 Month: March Memory: Impaired Memory Impairment: Storage deficit Behaviors:  Poor frustration tolerance       Comprehension  Auditory Comprehension Overall Auditory Comprehension: Appears within functional limits for tasks assessed    Expression Expression Primary Mode of Expression: Verbal Verbal Expression Overall Verbal Expression: Appears within functional limits for tasks assessed   Oral / Motor  Oral Motor/Sensory Function Overall Oral Motor/Sensory Function: Within functional limits Motor Speech Overall Motor Speech: Appears within functional limits for tasks assessed Respiration: Within functional limits Resonance: Within functional limits Articulation: Within functional limitis            Angela Nevin, MA, CCC-SLP Speech Therapy

## 2023-12-09 NOTE — TOC Progression Note (Signed)
 Transition of Care [facility]) - Progression Note    Patient Details  Name: Brittany Marks MRN: [id number] Date of Birth: [birth year]  Transition of Care [facility]) CM/SW Contact  Larrie Kass, LCSW Phone Number: 12/09/2023, 4:23 PM  Clinical Narrative:    CSW spoke with the pt at bedside. Pt is oriented to self, time, and place. CSW discussed recommendations for rehab. The pt is agreeable and would like to stay in the [facility] area. CSW explained the process, noting that the pt will need insurance authorization. Pt has not given permission to speak with her daughter. CSW will fax the pt's information for SNF placement. TOC to follow   Expected Discharge Plan: Skilled Nursing Facility Barriers to Discharge: Continued Medical Work up  Expected Discharge Plan and Services       Living arrangements for the past 2 months: Single Family Home                                       Social Determinants of Health (SDOH) Interventions SDOH Screenings   Food Insecurity: No Food Insecurity (12/07/2023)  Housing: Low Risk  (12/07/2023)  Transportation Needs: No Transportation Needs (12/07/2023)  Utilities: Not At Risk (12/07/2023)  Social Connections: Unknown (12/07/2023)  Tobacco Use: High Risk (12/08/2023)    Readmission Risk Interventions    12/08/2023   12:37 PM  Readmission Risk Prevention Plan  Post Dischage Appt Complete  Medication Screening Complete  Transportation Screening Complete

## 2023-12-09 NOTE — Progress Notes (Signed)
 PROGRESS NOTE    Brittany Marks  WUJ:[id number] DOB: Aug 19, 1941 DOA: 12/07/2023 PCP: Oneita Hurt, No    Brief Narrative:   Brittany Marks is a 83 y.o. 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 ED on 12/07/2023 via EMS from home with right shoulder pain following fall at home.  Currently lives alone.  Has had recurrent falls; with reported for [facility] on day of arrival.  Last fall was trying to sit down on her walker and which is slipped out from underneath her and fell forward on outstretched arm causing right shoulder pain.  Denies headache, no visual changes, no chest pain, no palpitations, no fever/chills, no paresthesias, no urinary symptoms.  Continues to endorse tobacco use.  Denies alcohol use.  In the ED, temperature 97.5 F, HR 86, RR 16, BP 166/95, SpO2 100% on 3 L nasal cannula which is her baseline.  WBC 13.7, hemoglobin 14.5, platelet count 264.  Sodium 132, potassium 3.1, chloride 97, CO2 22, glucose 115, BUN 29, creatinine 0.94.  AST 44, ALT 24.  Total bilirubin 0.7.  INR 1.2.  Urinalysis unrevealing.  UDS positive for opiates, otherwise negative.  Right shoulder x-ray with acute obliquely oriented fracture deformity involving the proximal shaft of the right humerus, advanced glenohumeral osteoarthritis.  MR brain with and without contrast with small acute infarct scattered in the left ACA/MCA distribution, advanced chronic small vessel disease.  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  Assessment & Plan:   Acute CVA Patient presenting to the ED following multiple recurrent falls at home. MR brain with and without contrast with small acute infarct scattered in the left ACA/MCA distribution, advanced chronic small vessel disease.  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  Concern acute CVA related to embolic nature from underlying malignancy.  EKG normal sinus rhythm. TTE: LVEF 40-45%, LV global hypokinesis, mild LVH, grade 2 diastolic dysfunction, RV systolic function mildly reduced, LA severely dilated, mild MR, IVC normal. -- Neurology following, appreciate assistance -- LDL 103 -- Hemoglobin A1c: 5.9 -- Atorvastatin 40 mg p.o. daily -- Aspirin 81 mg p.o. daily -- Monitor on telemetry -- PT/OT: Recommending SNF placement; TOC consulted  Right proximal humerus fracture Right shoulder x-ray with acute obliquely oriented fracture deformity involving the proximal shaft of the right humerus.  Seen by orthopedics, [name] who recommend outpatient follow-up with [name] in 1 week. -- NWB RUE with sling in place -- Tylenol as needed mild pain  Hypokalemia Repleted.  Potassium 4.1 this morning.  History of small cell lung cancer with bony metastasis Follows with medical oncology outpatient, Dr. [name].  Currently on palliative chemotherapy with Imfinzi every 4 weeks.  Chronic hypoxic respiratory failure At baseline on 3 L nasal cannula.  Stable. -- Albuterol neb as needed for  shortness of breath/wheezing  Neuropathy -- Gabapentin 100 mg p.o. twice daily  Tobacco use  disorder Continues to endorse tobacco use daily.  Counseled on need for complete cessation/abstinence -- Nicotine patch   DVT prophylaxis: enoxaparin (LOVENOX) injection 40 mg Start: 12/07/23 2200    Code Status: Limited: Do not attempt resuscitation (DNR) -DNR-LIMITED -Do Not Intubate/DNI  Family Communication: No family present at bedside this morning  Disposition Plan:  Level of care: Progressive Status is: Inpatient Remains inpatient appropriate because: Pending SNF placement    Consultants:  Neurology Orthopedics, [name]  Procedures:  TTE:   Antimicrobials:  None   Subjective: Patient seen examined bedside, lying in bed.  No specific complaints this morning.  Pain controlled. Denies headache, no vision changes, no chest pain, no palpitations, no shortness of breath, no abdominal pain, no fever/chills/night sweats, no nausea/vomiting/diarrhea, no focal weakness, no fatigue, no paresthesias.  No acute events overnight per nursing staff.  Objective: Vitals:   12/08/23 1459 12/08/23 2120 12/09/23 0552 12/09/23 1301  BP: 103/66 (!) 145/73 115/87 123/62  Pulse: 75 74 77 83  Resp: 20 18 16 18   Temp: 98.4 F (36.9 C) 98.6 F (37 C) 97.8 F (36.6 C) 98.5 F (36.9 C)  TempSrc: Oral Oral Oral Oral  SpO2: 97% 94% 90% 93%  Weight:      Height:        Intake/Output Summary (Last 24 hours) at 12/09/2023 1306 Last data filed at 12/09/2023 0900 Gross per 24 hour  Intake 360 ml  Output --  Net 360 ml   Filed Weights   12/07/23 0906  Weight: 63.6 kg    Examination:  Physical Exam: GEN: NAD, alert and oriented x 3, chronically ill appearance, appears older than stated age HEENT: NCAT, PERRL, EOMI, sclera clear, MMM PULM: CTAB w/o wheezes/crackles, normal respiratory effort, on 3 L nasal cannula which is her baseline with SpO2 93% at rest CV: RRR w/o M/G/R GI: abd soft, NTND, NABS, no R/G/M MSK: no peripheral edema, right upper extremity in sling, neurovascular  intact, otherwise moves all extremities independently NEURO: No focal neurological deficit PSYCH: normal mood/affect Integumentary: No concerning rashes/lesions/wounds noted on exposed skin surfaces    Data Reviewed: I have personally reviewed following labs and imaging studies  CBC: Recent Labs  Lab 12/03/23 1338 12/07/23 0954 12/07/23 1605 12/09/23 0859  WBC 8.8 13.7*  --  9.6  NEUTROABS 6.7 11.5*  --   --   HGB 14.0 14.5 13.9 13.0  HCT 40.4 44.7 41.0 39.9  MCV 94.6 99.3  --  99.0  PLT 236 264  --  217   Basic Metabolic Panel: Recent Labs  Lab 12/03/23 1338 12/07/23 0954 12/07/23 1557 12/07/23 1605 12/09/23 0406  NA 134* 132*  --  135 135  K 3.9 3.1*  --  4.2 4.1  CL 101 97*  --  100 104  CO2 26 22  --   --  23  GLUCOSE 104* 115*  --  123* 85  BUN 18 29*  --  25* 21  CREATININE 0.68 0.94  --  1.00 0.60  CALCIUM 9.2 8.5*  --   --  8.5*  MG  --   --  2.0  --  1.9   GFR: Estimated Creatinine Clearance: 47.5 mL/min (by C-G formula based on SCr of 0.6 mg/dL). Liver Function Tests: Recent Labs  Lab 12/03/23 1338 12/07/23 1557 12/09/23 0406  AST 20 44* 33  ALT 11 24 20   ALKPHOS 77  68 56  BILITOT 0.7 0.7 1.1  PROT 7.1 6.7 6.2*  ALBUMIN 3.8 3.3* 3.2*   No results for input(s): "LIPASE", "AMYLASE" in the last 168 hours. No results for input(s): "AMMONIA" in the last 168 hours. Coagulation Profile: Recent Labs  Lab 12/07/23 1557  INR 1.2   Cardiac Enzymes: No results for input(s): "CKTOTAL", "CKMB", "CKMBINDEX", "TROPONINI" in the last 168 hours. BNP (last 3 results) No results for input(s): "PROBNP" in the last 8760 hours. HbA1C: Recent Labs    12/07/23 1626  HGBA1C 5.9*   CBG: No results for input(s): "GLUCAP" in the last 168 hours. Lipid Profile: Recent Labs    12/08/23 0415  CHOL 173  HDL 53  LDLCALC 103*  TRIG 86  CHOLHDL 3.3   Thyroid Function Tests: Recent Labs    12/07/23 1630  TSH 1.847   Anemia Panel: Recent Labs     12/07/23 1630  VITAMINB12 820   Sepsis Labs: No results for input(s): "PROCALCITON", "LATICACIDVEN" in the last 168 hours.  No results found for this or any previous visit (from the past 240 hours).       Radiology Studies: VAS [place] LOWER EXTREMITY VENOUS (DVT) Result Date: 12/08/2023  Lower Venous DVT Study Patient Name:  Brittany Marks  Date of Exam:   12/08/2023 Medical Rec #: [id number]          Accession #:    [id number] Date of Birth: 1941/02/14          Patient Gender: F Patient Age:   21 years Exam Location:  [facility] Procedure:      VAS [place] LOWER EXTREMITY VENOUS (DVT) Referring Phys: Scheryl Marten XU --------------------------------------------------------------------------------  Indications: Stroke, and Embolic stroke, lung cancer, recent fall/injury.  Comparison Study: No prior exam. Performing Technologist: Fernande Bras  Examination Guidelines: A complete evaluation includes B-mode imaging, spectral Doppler, color Doppler, and power Doppler as needed of all accessible portions of each vessel. Bilateral testing is considered an integral part of a complete examination. Limited examinations for reoccurring indications may be performed as noted. The reflux portion of the exam is performed with the patient in reverse Trendelenburg.  +---------+---------------+---------+-----------+----------+--------------+ RIGHT    CompressibilityPhasicitySpontaneityPropertiesThrombus Aging +---------+---------------+---------+-----------+----------+--------------+ CFV      Full           Yes      Yes                                 +---------+---------------+---------+-----------+----------+--------------+ SFJ      Full           Yes      Yes                                 +---------+---------------+---------+-----------+----------+--------------+ FV Prox  Full                                                         +---------+---------------+---------+-----------+----------+--------------+ FV Mid   Full                                                        +---------+---------------+---------+-----------+----------+--------------+  FV DistalFull                                                        +---------+---------------+---------+-----------+----------+--------------+ PFV      Full                                                        +---------+---------------+---------+-----------+----------+--------------+ POP      Full           Yes      Yes                                 +---------+---------------+---------+-----------+----------+--------------+ PTV      Full                                                        +---------+---------------+---------+-----------+----------+--------------+ PERO     Full                                                        +---------+---------------+---------+-----------+----------+--------------+   +---------+---------------+---------+-----------+----------+--------------+ LEFT     CompressibilityPhasicitySpontaneityPropertiesThrombus Aging +---------+---------------+---------+-----------+----------+--------------+ CFV      Full           Yes      Yes                                 +---------+---------------+---------+-----------+----------+--------------+ SFJ      Full           Yes      Yes                                 +---------+---------------+---------+-----------+----------+--------------+ FV Prox  Full                                                        +---------+---------------+---------+-----------+----------+--------------+ FV Mid   Full                                                        +---------+---------------+---------+-----------+----------+--------------+ FV DistalFull                                                         +---------+---------------+---------+-----------+----------+--------------+  PFV      Full                                                        +---------+---------------+---------+-----------+----------+--------------+ POP      Full           Yes      Yes                                 +---------+---------------+---------+-----------+----------+--------------+ PTV      Full                                                        +---------+---------------+---------+-----------+----------+--------------+ PERO     Full                                                        +---------+---------------+---------+-----------+----------+--------------+     Summary: BILATERAL: - No evidence of deep vein thrombosis seen in the lower extremities, bilaterally. -No evidence of popliteal cyst, bilaterally.   *See table(s) above for measurements and observations. Electronically signed by Coral Else MD on 12/08/2023 at 9:28:12 PM.    Final    ECHOCARDIOGRAM COMPLETE Result Date: 12/08/2023    ECHOCARDIOGRAM REPORT   Patient Name:   Brittany Marks Date of Exam: 12/08/2023 Medical Rec #:  [id number]         Height:       62.0 in Accession #:    [id number]        Weight:       140.3 lb Date of Birth:  02-Mar-1941         BSA:          1.644 m Patient Age:    82 years          BP:           112/94 mmHg Patient Gender: F                 HR:           80 bpm. Exam Location:  Inpatient Procedure: 2D Echo, Cardiac Doppler and Color Doppler (Both Spectral and Color            Flow Doppler were utilized during procedure). Indications:    Stroke  History:        Patient has no prior history of Echocardiogram examinations.                 Cancer; Risk Factors:Former Smoker.  Sonographer:    Amy Chionchio Referring Phys: [id number] ALLISON WOLFE IMPRESSIONS  1. Limited windows. Left ventricular ejection fraction, by estimation, is 40 to 45%. The left ventricle has mildly decreased function. The left ventricle  demonstrates global hypokinesis. There is mild left ventricular hypertrophy. Left ventricular diastolic parameters are consistent with Grade II diastolic dysfunction (pseudonormalization).  2. Right ventricular systolic function is mildly reduced. The right ventricular size is normal. There  is normal pulmonary artery systolic pressure. The estimated right ventricular systolic pressure is 24.0 mmHg.  3. Left atrial size was severely dilated.  4. Mild mitral valve regurgitation.  5. The aortic valve was not well visualized. Aortic valve regurgitation is not visualized.  6. The inferior vena cava is normal in size with greater than 50% respiratory variability, suggesting right atrial pressure of 3 mmHg. FINDINGS  Left Ventricle: Limited windows. Left ventricular ejection fraction, by estimation, is 40 to 45%. The left ventricle has mildly decreased function. The left ventricle demonstrates global hypokinesis. Strain imaging was not performed. The left ventricular internal cavity size was normal in size. There is mild left ventricular hypertrophy. Left ventricular diastolic parameters are consistent with Grade II diastolic dysfunction (pseudonormalization). Right Ventricle: The right ventricular size is normal. Right ventricular systolic function is mildly reduced. There is normal pulmonary artery systolic pressure. The tricuspid regurgitant velocity is 2.29 m/s, and with an assumed right atrial pressure of  3 mmHg, the estimated right ventricular systolic pressure is 24.0 mmHg. Left Atrium: Left atrial size was severely dilated. Right Atrium: Right atrial size was normal in size. Pericardium: There is no evidence of pericardial effusion. Mitral Valve: Mild mitral valve regurgitation. MV peak gradient, 10.1 mmHg. The mean mitral valve gradient is 5.0 mmHg. Tricuspid Valve: Tricuspid valve regurgitation is mild. Aortic Valve: The aortic valve was not well visualized. Aortic valve regurgitation is not visualized. Aortic  valve mean gradient measures 7.0 mmHg. Aortic valve peak gradient measures 10.2 mmHg. Aortic valve area, by VTI measures 1.15 cm. Pulmonic Valve: Pulmonic valve regurgitation is not visualized. Aorta: The aortic root and ascending aorta are structurally normal, with no evidence of dilitation. Venous: The inferior vena cava is normal in size with greater than 50% respiratory variability, suggesting right atrial pressure of 3 mmHg. IAS/Shunts: No atrial level shunt detected by color flow Doppler. Additional Comments: 3D imaging was not performed.  LEFT VENTRICLE PLAX 2D LVIDd:         4.80 cm     Diastology LVIDs:         4.10 cm     LV e' medial:    5.33 cm/s LV PW:         0.90 cm     LV E/e' medial:  27.4 LV IVS:        1.10 cm     LV e' lateral:   9.68 cm/s LVOT diam:     2.00 cm     LV E/e' lateral: 15.1 LV SV:         40 LV SV Index:   24 LVOT Area:     3.14 cm  LV Volumes (MOD) LV vol d, MOD A2C: 86.1 ml LV vol d, MOD A4C: 88.3 ml LV vol s, MOD A2C: 44.8 ml LV vol s, MOD A4C: 46.4 ml LV SV MOD A2C:     41.3 ml LV SV MOD A4C:     88.3 ml LV SV MOD BP:      44.5 ml RIGHT VENTRICLE            IVC RV Basal diam:  3.50 cm    IVC diam: 1.60 cm RV S prime:     5.87 cm/s TAPSE (M-mode): 0.9 cm LEFT ATRIUM              Index        RIGHT ATRIUM           Index LA Vol (A2C):  85.4 ml  51.94 ml/m  RA Area:     13.60 cm LA Vol (A4C):   101.0 ml 61.42 ml/m  RA Volume:   32.50 ml  19.77 ml/m LA Biplane Vol: 94.7 ml  57.59 ml/m  AORTIC VALVE                     PULMONIC VALVE AV Area (Vmax):    1.19 cm      PV Vmax:       0.63 m/s AV Area (Vmean):   0.99 cm      PV Peak grad:  1.6 mmHg AV Area (VTI):     1.15 cm AV Vmax:           160.00 cm/s AV Vmean:          125.000 cm/s AV VTI:            0.350 m AV Peak Grad:      10.2 mmHg AV Mean Grad:      7.0 mmHg LVOT Vmax:         60.60 cm/s LVOT Vmean:        39.300 cm/s LVOT VTI:          0.128 m LVOT/AV VTI ratio: 0.37  AORTA Ao Root diam: 2.70 cm Ao Asc diam:  3.00  cm MITRAL VALVE                TRICUSPID VALVE MV Area (PHT): 4.19 cm     TR Peak grad:   21.0 mmHg MV Area VTI:   1.11 cm     TR Vmax:        229.00 cm/s MV Peak grad:  10.1 mmHg MV Mean grad:  5.0 mmHg     SHUNTS MV Vmax:       1.59 m/s     Systemic VTI:  0.13 m MV Vmean:      111.0 cm/s   Systemic Diam: 2.00 cm MV Decel Time: 181 msec MV E velocity: 146.00 cm/s MV A velocity: 115.00 cm/s MV E/A ratio:  1.27 Carolan Clines Electronically signed by Carolan Clines Signature Date/Time: 12/08/2023/2:11:03 PM    Final    CT ANGIO HEAD NECK W WO CM Result Date: 12/07/2023 CLINICAL DATA:  Acute left ACA and left caudate infarcts. Small cell lung cancer. EXAM: CT ANGIOGRAPHY HEAD AND NECK WITH AND WITHOUT CONTRAST TECHNIQUE: Multidetector CT imaging of the head and neck was performed using the standard protocol during bolus administration of intravenous contrast. Multiplanar CT image reconstructions and MIPs were obtained to evaluate the vascular anatomy. Carotid stenosis measurements (when applicable) are obtained utilizing NASCET criteria, using the distal internal carotid diameter as the denominator. RADIATION DOSE REDUCTION: This exam was performed according to the departmental dose-optimization program which includes automated exposure control, adjustment of the mA and/or kV according to patient size and/or use of iterative reconstruction technique. CONTRAST:  75mL OMNIPAQUE IOHEXOL 350 MG/ML SOLN COMPARISON:  MR head without and with contrast 12/07/2023. CT head without and with contrast 06/16/2019 FINDINGS: CT HEAD FINDINGS Brain: The acute ACA territory infarcts are below the resolution of CT. Advanced white matter disease is again noted. Remote left basal ganglia infarct is present. Deep gray nuclei are otherwise within normal limits. The ventricles are proportionate to the degree of atrophy. No significant extraaxial fluid collection is present. The brainstem and cerebellum are within normal limits. Midline  structures are within normal limits. Vascular: Atherosclerotic calcifications are present within the cavernous  internal carotid arteries bilaterally and at the dural margin of the left vertebral artery. No hyperdense vessel is present. Skull: Calvarium is intact. No focal lytic or blastic lesions are present. No significant extracranial soft tissue lesion is present. Sinuses/Orbits: The paranasal sinuses and mastoid air cells are clear. Bilateral lens replacements are noted. Globes and orbits are otherwise unremarkable. Review of the MIP images confirms the above findings CTA NECK FINDINGS Aortic arch: Extensive atherosclerotic calcifications are present the aortic arch and great vessel origins. High-grade, near occlusive stenosis is present at the proximal left subclavian artery. No dissection or aneurysm is present. Right carotid system: The right common carotid artery demonstrates some mural calcification without focal stenosis. Dense atherosclerotic calcifications are present at the right carotid bifurcation lumen is narrowed to 1 mm. More distal cervical right ICA is within normal limits. Left carotid system: The left common carotid artery demonstrates extensive mural calcifications without focal stenosis relative to the more distal vessel. Atherosclerotic calcifications are present at the bifurcation and proximal left ICA. Lumen is narrowed to 2 mm. The more distal cervical left ICA is of normal caliber. Mural calcification is present in the vessel just below the skull base without focal stenosis. Vertebral arteries: Right vertebral artery is dominant vessel. High-grade stenosis is present at the origin of the left vertebral artery. Calcifications are present in the left vertebral artery in the proximal left V2 segment without a tandem stenosis. Skeleton: Multilevel degenerative changes are present in the cervical spine. Uncovertebral spurring contributes to moderate foraminal stenosis bilaterally at C5-6. No  focal osseous lesions are present. Other neck: The soft tissues of the neck are otherwise unremarkable. Salivary glands are within normal limits. Thyroid is normal. No significant adenopathy is present. No focal mucosal or submucosal lesions are present. Upper chest: Post radiation changes are again noted in the left upper lobe. Paraseptal emphysematous changes are present bilaterally. Review of the MIP images confirms the above findings CTA HEAD FINDINGS Anterior circulation: Atherosclerotic calcifications are present within the cavernous internal carotid arteries bilaterally without significant stenosis through the ICA termini. The A1 and M1 segments are normal. The left A1 segment is dominant. The anterior communicating artery is patent. MCA bifurcations are normal bilaterally. The ACA and MCA branch vessels are normal bilaterally. Posterior circulation: Atherosclerotic calcifications are present at the dural margin of the left vertebral artery without focal stenosis. The PICA origins are visualized and normal. The vertebrobasilar junction basilar artery is normal. Both posterior cerebral arteries scratched at the superior cerebellar arteries are patent. Both posterior cerebral arteries originate from the basilar tip. Mild narrowing is present in the mid left P2 segment. Venous sinuses: The dural sinuses are patent. The straight sinus and deep cerebral veins are intact. Cortical veins are within normal limits. No significant vascular malformation is evident. Anatomic variants: None Review of the MIP images confirms the above findings IMPRESSION: 1. The acute left ACA territory infarcts are below the resolution of CT. 2. Advanced white matter disease likely reflects the sequela of chronic microvascular ischemia. 3. Remote left basal ganglia infarct. 4. 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 5. High-grade stenosis at the origin of the left vertebral artery. 6. Severe stenosis at  the right carotid bifurcation with luminal narrowing of 1 mm. 7. Mild narrowing in the mid left P2 segment. 8. No other significant proximal stenosis, aneurysm, or branch vessel occlusion within the Circle of Willis. 9. Post radiation changes in the left upper lobe. 10. Multilevel degenerative changes  in the cervical spine. 11. Aortic Atherosclerosis (ICD10-I70.0) and Emphysema (ICD10-J43.9). Electronically Signed   By: Marin Roberts M.D.   On: 12/07/2023 17:31        Scheduled Meds:  aspirin EC  81 mg Oral Daily   atorvastatin  40 mg Oral Daily   enoxaparin (LOVENOX) injection  40 mg Subcutaneous QHS   gabapentin  100 mg Oral BID   multivitamin with minerals  1 tablet Oral Q breakfast   nicotine  14 mg Transdermal Daily   sodium chloride flush  3-10 mL Intravenous Q12H   Continuous Infusions:   LOS: 2 days    Time spent: 48 minutes spent on chart review, discussion with nursing staff, consultants, updating family and interview/physical exam; more than 50% of that time was spent in counseling and/or coordination of care.    Alvira Philips [place], DO [facility] Available via Epic secure chat 7am-7pm After these hours, please refer to coverage provider listed on [email or web address] 12/09/2023, 1:06 PM

## 2023-12-10 ENCOUNTER — Institutional Professional Consult (permissible substitution): Payer: Federal, State, Local not specified - PPO | Admitting: Pulmonary Disease

## 2023-12-10 DIAGNOSIS — I639 Cerebral infarction, unspecified: Secondary | ICD-10-CM | POA: Diagnosis not present

## 2023-12-10 NOTE — Plan of Care (Signed)
   Problem: Coping: Goal: Will verbalize positive feelings about self Outcome: Progressing

## 2023-12-10 NOTE — Progress Notes (Signed)
 Physical Therapy Treatment Patient Details Name: Brittany Marks MRN: [id number] DOB: 04-07-1941 Today's Date: 12/10/2023   History of Present Illness Patient is a 83 year old female who was admitted on 3/1 with right shoulder pain. Patient was admitted with acute CVA, right proximal humerus fracture, hypokalemia. 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    PT Comments  PT - Cognition Comments: AxO x 3 pleasant and willing but fatigues easily.  Friend/neighbor in room very supportive and positive.  Pt lives home alone with her cat.  Her Friend assists with grocceries, transport MD and some house keeping. Pt was OOB in recliner.  "Feels a little better after a bath", stated Friend. Assisted out of recliner to [facility] was some what difficult.  General transfer comment: Pt required Mod?Min Assist to rise with difficulty due to R UE in sling.  Assisted with 1/4 turn to [facility].  Assisted with doffing under pants.  Pt had a med reg BM so assisted with peri care as pt was unable to self perform while standing due to impaired balance.  Assisted with donning under pants then another 1/4 turn back to recliner.  Pt required a seated rest break before attempting to amb.  Pt remainined on 3 lts which is her base.  sats decreased from 90% at rest 82%.  VC's for "purse lip" breathing. General Gait Details: decreased amb distance this session due to increased c/o fatigue and dyspnea 3/4.  Pt on 3 lts (baseline) sats decreased to 82% and dyspnea 3/4.  Ceased actvity and increased supplemental oxygen to 4 lts.  Slow recovery to low 90"s.  HR 87.  Pt c/o max "fatigue" and admits "it doesn't take much". Reported to RN drop in sats as well as poss need for IS and/or flutter.   Pt will need ST Rehab at SNF to address mobility and functional decline prior to safely returning home.    If plan is discharge home, recommend the  following: A little help with walking and/or transfers;A little help with bathing/dressing/bathroom;Assistance with cooking/housework;Assistance with feeding;Assist for transportation   Can travel by private vehicle     Yes  Equipment Recommendations   (Hemi Environmental consultant)    Recommendations for Other Services       Precautions / Restrictions Precautions Precautions: Fall Precaution/Restrictions Comments: Home oxygen 3lts Required Braces or Orthoses: Sling Restrictions Weight Bearing Restrictions Per Provider Order: Yes RUE Weight Bearing Per Provider Order: Non weight bearing     Mobility  Bed Mobility               General bed mobility comments: OOB in recliner    Transfers Overall transfer level: Needs assistance Equipment used: 1 person hand held assist Transfers: Bed to chair/wheelchair/BSC   Stand pivot transfers: Min assist, Mod assist         General transfer comment: Pt required Mod?Min Assist to rise with difficulty due to R UE in sling.  Assisted with 1/4 turn to [facility].  Assisted with doffing under pants.  Pt had a med reg BM so assisted with peri care as pt was unable to self perform while standing due to impaired balance.  Assisted with donning under pants then another 1/4 turn back to recliner.  Pt required a seated rest break before attempting to amb.  Pt remainined on 3 lts which is her base.  sats decreased from 90% at rest 82%.  VC's  for "purse lip" breathing.    Ambulation/Gait Ambulation/Gait assistance: Mod assist, +2 safety/equipment Gait Distance (Feet): 18 Feet Assistive device: 1 person hand held assist Gait Pattern/deviations: Step-through pattern Gait velocity: decreased     General Gait Details: decreased amb distance this session due to increased c/o fatigue and dyspnea 3/4.  Pt on 3 lts (baseline) sats decreased to 82% and dyspnea 3/4.  Ceased actvity and increased supplemental oxygen to 4 lts.  Slow recovery to low 90"s.  HR 87.  Pt c/o max  "fatigue" and admits "it doesn't take much".   Stairs             Wheelchair Mobility     Tilt Bed    Modified Rankin (Stroke Patients Only)       Balance                                            Communication    Cognition Arousal: Alert Behavior During Therapy: WFL for tasks assessed/performed   PT - Cognitive impairments: No apparent impairments                       PT - Cognition Comments: AxO x 3 pleasant and willing but fatigues easily.  Friend/neighbor in room very supportive and positive.  Pt lives home alone with her cat.  Her Friend assists with grocceries, transport MD and some house keeping.        Cueing    Exercises      General Comments        Pertinent Vitals/Pain Pain Assessment Pain Assessment: Faces Faces Pain Scale: Hurts a little bit Pain Location: RUE Pain Descriptors / Indicators: Sore Pain Intervention(s): Monitored during session    Home Living                          Prior Function            PT Goals (current goals can now be found in the care plan section) Progress towards PT goals: Progressing toward goals    Frequency    Min 3X/week      PT Plan      Co-evaluation              AM-PAC PT "6 Clicks" Mobility   Outcome Measure  Help needed turning from your back to your side while in a flat bed without using bedrails?: A Lot Help needed moving from lying on your back to sitting on the side of a flat bed without using bedrails?: A Lot Help needed moving to and from a bed to a chair (including a wheelchair)?: A Lot Help needed standing up from a chair using your arms (e.g., wheelchair or bedside chair)?: A Lot Help needed to walk in hospital room?: A Lot Help needed climbing 3-5 steps with a railing? : Total 6 Click Score: 11    End of Session Equipment Utilized During Treatment: Gait belt Activity Tolerance: Patient limited by fatigue Patient left: in  chair;with call bell/phone within reach;with chair alarm set Nurse Communication: Mobility status PT Visit Diagnosis: Other abnormalities of gait and mobility (R26.89)     Time: 1420-1445 PT Time Calculation (min) (ACUTE ONLY): 25 min  Charges:    $Gait Training: 8-22 mins $Therapeutic Activity: 8-22 mins PT General Charges $$ ACUTE PT VISIT:  1 Visit                     {Lillianne Eick [facility]  PTA Acute  Rehabilitation Services Office M-F          [phone]

## 2023-12-10 NOTE — Plan of Care (Signed)

## 2023-12-10 NOTE — TOC Progression Note (Signed)
 Transition of Care [facility]) - Progression Note    Patient Details  Name: Brittany Marks MRN: [id number] Date of Birth: 1941-03-25  [facility]) CM/SW Contact  Larrie Kass, LCSW Phone Number: 12/10/2023, 3:06 PM  Clinical Narrative:    CSW presented bed offers to the p, the pt's friend, and the pt's daughter. Pt has chosen [facility]. Pt's primary insurance coverage has been switched to Medicare as primary. Pt will not need insurance authorization. CSW confirmed with [facility] that the pt can be discharged to the facility tomorrow. TOC to follow   Expected Discharge Plan: Skilled Nursing Facility   Expected Discharge Plan and Services       Living arrangements for the past 2 months: Single Family Home                                       Social Determinants of Health (SDOH) Interventions SDOH Screenings   Food Insecurity: No Food Insecurity (12/07/2023)  Housing: Low Risk  (12/07/2023)  Transportation Needs: No Transportation Needs (12/07/2023)  Utilities: Not At Risk (12/07/2023)  Social Connections: Unknown (12/07/2023)  Tobacco Use: High Risk (12/08/2023)    Readmission Risk Interventions    12/08/2023   12:37 PM  Readmission Risk Prevention Plan  Post Dischage Appt Complete  Medication Screening Complete  Transportation Screening Complete

## 2023-12-10 NOTE — Progress Notes (Signed)
 IV removed d/t to leaking when flushed. Ok to leave out per Dr [place].

## 2023-12-10 NOTE — Progress Notes (Signed)
 PROGRESS NOTE    Brittany Marks  ZOX:[id number] DOB: 1941/05/29 DOA: 12/07/2023 PCP: Oneita Hurt, No    Brief Narrative:   Brittany Marks is a 83 y.o. 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  Currently lives alone.  Has had recurrent falls; with reported for [facility] on day of arrival.  Last fall was trying to sit down on her walker and which is slipped out from underneath her and fell forward on outstretched arm causing right shoulder pain.  Denies headache, no visual changes, no chest pain, no palpitations, no fever/chills, no paresthesias, no urinary symptoms.  Continues to endorse tobacco use.  Denies alcohol use.  In the ED, temperature 97.5 F, HR 86, RR 16, BP 166/95, SpO2 100% on 3 L nasal cannula which is her baseline.  WBC 13.7, hemoglobin 14.5, platelet count 264.  Sodium 132, potassium 3.1, chloride 97, CO2 22, glucose 115, BUN 29, creatinine 0.94.  AST 44, ALT 24.  Total bilirubin 0.7.  INR 1.2.  Urinalysis unrevealing.  UDS positive for opiates, otherwise negative.  Right shoulder x-ray with acute obliquely oriented fracture deformity involving the proximal shaft of the right humerus, advanced glenohumeral osteoarthritis.  MR brain with and without contrast with small acute infarct scattered in the left ACA/MCA distribution, advanced chronic small vessel disease.  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  Assessment & Plan:   Acute CVA Patient presenting to the ED following multiple recurrent falls at home. MR brain with and without contrast with small acute infarct scattered in the left ACA/MCA distribution, advanced chronic small vessel disease.  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  Concern acute CVA related to embolic nature from underlying malignancy.  EKG normal sinus rhythm. TTE: LVEF 40-45%, LV global hypokinesis, mild LVH, grade 2 diastolic dysfunction, RV systolic function mildly reduced, LA severely dilated, mild MR, IVC normal.  Seen by neurology and now signed off. -- LDL 103 -- Hemoglobin A1c: 5.9 -- Atorvastatin 40 mg p.o. daily -- Aspirin 81 mg p.o. daily -- Monitor on telemetry -- Outpatient follow-up with neurology 4 to 6 weeks -- PT/OT: Recommending SNF placement; TOC consulted  Right proximal humerus fracture Right shoulder x-ray with acute obliquely oriented fracture deformity involving the proximal shaft of the right humerus.  Seen by orthopedics, [name] who recommend outpatient follow-up with [name] in 1 week. -- NWB RUE with sling in place -- Tylenol as needed mild pain  Hypokalemia Repleted.  Potassium 4.1 this morning.  History of small cell lung cancer with bony metastasis Follows with medical oncology outpatient, Dr. [name].  Currently on palliative chemotherapy with Imfinzi every 4 weeks.  Chronic hypoxic respiratory failure At baseline on  3 L nasal cannula.  Stable. -- Albuterol neb as needed for shortness of breath/wheezing  Neuropathy --  Gabapentin 100 mg p.o. twice daily  Tobacco use disorder Continues to endorse tobacco use daily.  Counseled on need for complete cessation/abstinence -- Nicotine patch   DVT prophylaxis: enoxaparin (LOVENOX) injection 40 mg Start: 12/07/23 2200    Code Status: Limited: Do not attempt resuscitation (DNR) -DNR-LIMITED -Do Not Intubate/DNI  Family Communication: No family present at bedside this morning  Disposition Plan:  Level of care: Progressive Status is: Inpatient Remains inpatient appropriate because: Pending SNF placement    Consultants:  Neurology Orthopedics, [name]  Procedures:  TTE:   Antimicrobials:  None   Subjective: Patient seen examined bedside, lying in bed.  No specific complaints this morning.  RN present at bedside.  Pain controlled. Denies headache, no vision changes, no chest pain, no palpitations, no shortness of breath, no abdominal pain, no fever/chills/night sweats, no nausea/vomiting/diarrhea, no focal weakness, no fatigue, no paresthesias.  No acute events overnight per nursing staff.  Medically stable for discharge to SNF once bed available.  Objective: Vitals:   12/09/23 1301 12/09/23 2121 12/10/23 0629 12/10/23 1250  BP: 123/62 (!) 149/108 121/78 104/87  Pulse: 83 84 75 86  Resp: 18 20 20  (!) 22  Temp: 98.5 F (36.9 C) 98 F (36.7 C) 98.2 F (36.8 C) 98.1 F (36.7 C)  TempSrc: Oral Oral Oral Oral  SpO2: 93% 95% 95% 94%  Weight:      Height:        Intake/Output Summary (Last 24 hours) at 12/10/2023 1254 Last data filed at 12/10/2023 0940 Gross per 24 hour  Intake 123 ml  Output 300 ml  Net -177 ml   Filed Weights   12/07/23 0906  Weight: 63.6 kg    Examination:  Physical Exam: GEN: NAD, alert and oriented x 3, chronically ill appearance, appears older than stated age HEENT: NCAT, PERRL, EOMI, sclera clear, MMM PULM: CTAB w/o wheezes/crackles, normal respiratory effort, on 3 L nasal cannula which is her baseline with  SpO2 93% at rest CV: RRR w/o M/G/R GI: abd soft, NTND, NABS, no R/G/M MSK: no peripheral edema, right upper extremity in sling, neurovascular intact, otherwise moves all extremities independently NEURO: No focal neurological deficit PSYCH: normal mood/affect Integumentary: No concerning rashes/lesions/wounds noted on exposed skin surfaces    Data Reviewed: I have personally reviewed following labs and imaging studies  CBC: Recent Labs  Lab 12/03/23 1338 12/07/23 0954 12/07/23 1605 12/09/23 0859  WBC 8.8 13.7*  --  9.6  NEUTROABS 6.7 11.5*  --   --   HGB 14.0 14.5 13.9 13.0  HCT 40.4 44.7 41.0 39.9  MCV 94.6 99.3  --  99.0  PLT 236 264  --  217   Basic Metabolic Panel: Recent Labs  Lab 12/03/23 1338 12/07/23 0954 12/07/23 1557 12/07/23 1605 12/09/23 0406  NA 134* 132*  --  135 135  K 3.9 3.1*  --  4.2 4.1  CL 101 97*  --  100 104  CO2 26 22  --   --  23  GLUCOSE 104* 115*  --  123* 85  BUN 18 29*  --  25* 21  CREATININE 0.68 0.94  --  1.00 0.60  CALCIUM 9.2 8.5*  --   --  8.5*  MG  --   --  2.0  --  1.9   GFR: Estimated Creatinine Clearance: 47.5 mL/min (by C-G formula based on SCr  of 0.6 mg/dL). Liver Function Tests: Recent Labs  Lab 12/03/23 1338 12/07/23 1557 12/09/23 0406  AST 20 44* 33  ALT 11 24 20   ALKPHOS 77 68 56  BILITOT 0.7 0.7 1.1  PROT 7.1 6.7 6.2*  ALBUMIN 3.8 3.3* 3.2*   No results for input(s): "LIPASE", "AMYLASE" in the last 168 hours. No results for input(s): "AMMONIA" in the last 168 hours. Coagulation Profile: Recent Labs  Lab 12/07/23 1557  INR 1.2   Cardiac Enzymes: No results for input(s): "CKTOTAL", "CKMB", "CKMBINDEX", "TROPONINI" in the last 168 hours. BNP (last 3 results) No results for input(s): "PROBNP" in the last 8760 hours. HbA1C: Recent Labs    12/07/23 1626  HGBA1C 5.9*   CBG: No results for input(s): "GLUCAP" in the last 168 hours. Lipid Profile: Recent Labs    12/08/23 0415  CHOL 173  HDL 53   LDLCALC 103*  TRIG 86  CHOLHDL 3.3   Thyroid Function Tests: Recent Labs    12/07/23 1630  TSH 1.847   Anemia Panel: Recent Labs    12/07/23 1630  VITAMINB12 820   Sepsis Labs: No results for input(s): "PROCALCITON", "LATICACIDVEN" in the last 168 hours.  No results found for this or any previous visit (from the past 240 hours).       Radiology Studies: No results found.       Scheduled Meds:  aspirin EC  81 mg Oral Daily   atorvastatin  40 mg Oral Daily   enoxaparin (LOVENOX) injection  40 mg Subcutaneous QHS   gabapentin  100 mg Oral BID   multivitamin with minerals  1 tablet Oral Q breakfast   nicotine  14 mg Transdermal Daily   sodium chloride flush  3-10 mL Intravenous Q12H   Continuous Infusions:   LOS: 3 days    Time spent: 48 minutes spent on chart review, discussion with nursing staff, consultants, updating family and interview/physical exam; more than 50% of that time was spent in counseling and/or coordination of care.    Alvira Philips [place], DO [facility] Available via Epic secure chat 7am-7pm After these hours, please refer to coverage provider listed on [email or web address] 12/10/2023, 12:54 PM

## 2023-12-11 DIAGNOSIS — I639 Cerebral infarction, unspecified: Secondary | ICD-10-CM | POA: Diagnosis not present

## 2023-12-11 MED ORDER — SODIUM CHLORIDE 0.9% FLUSH: 10.0000 mL | INTRAVENOUS | Status: DC | PRN

## 2023-12-11 MED ORDER — CHLORHEXIDINE GLUCONATE CLOTH 2 % EX PADS
6.0000 | MEDICATED_PAD | Freq: Every day | CUTANEOUS | Status: DC
  Administered 2023-12-11 – 2023-12-12 (×2): 6 via TOPICAL

## 2023-12-11 MED ORDER — SENNOSIDES-DOCUSATE SODIUM 8.6-50 MG PO TABS: 1.0000 | ORAL_TABLET | Freq: Every evening | ORAL | Status: DC | PRN

## 2023-12-11 MED ORDER — SODIUM CHLORIDE 0.9% FLUSH
10.0000 mL | Freq: Two times a day (BID) | INTRAVENOUS | Status: DC
  Administered 2023-12-11: 20 mL
  Administered 2023-12-12: 10 mL

## 2023-12-11 MED ORDER — ASPIRIN 81 MG PO TBEC: 81.0000 mg | DELAYED_RELEASE_TABLET | Freq: Every day | ORAL | Status: DC

## 2023-12-11 MED ORDER — ATORVASTATIN CALCIUM 40 MG PO TABS: 40.0000 mg | ORAL_TABLET | Freq: Every day | ORAL | Status: DC

## 2023-12-11 MED ORDER — SODIUM CHLORIDE 0.9 % IV BOLUS
1000.0000 mL | Freq: Once | INTRAVENOUS | Status: AC
  Administered 2023-12-11: 1000 mL via INTRAVENOUS

## 2023-12-11 MED ORDER — ACETAMINOPHEN 325 MG PO TABS: 650.0000 mg | ORAL_TABLET | ORAL | Status: DC | PRN

## 2023-12-11 NOTE — Progress Notes (Signed)
 Patient was waiting to go to SNF for rehab and during rounds BP was low 70s/ as charted both manual and Dinamap.  Got her back to bed to reassess and improved into low 90s/.  MD notified and will give a fluid bolus and monitor overnight.  Daughter and Rehab notified.

## 2023-12-11 NOTE — TOC Transition Note (Signed)
 Transition of Care [facility]) - Discharge Note   Patient Details  Name: Brittany Marks MRN: [id number] Date of Birth: 1941/01/26  Transition of Care [facility]) CM/SW Contact:  Larrie Kass, LCSW Phone Number: 12/11/2023, 9:52 AM   Clinical Narrative:    Pt to be discharged to [facility]. CSW spoke with the pt's daughter, Isaiah Serge, and informed her of the pt's discharge plan. She agrees with the plan. Pt's room assignment is 508P. The RN is to call the report to [phone]. PTAR was called. TOC sign-off     Barriers to Discharge: Barriers Resolved   Patient Goals and CMS Choice Patient states their goals for this hospitalization and ongoing recovery are:: SNf to get stonger CMS Medicare.gov Compare Post Acute Care list provided to:: Patient Choice offered to / list presented to : Patient, Adult Children      Discharge Placement              Patient chooses bed at: [facility] Patient to be transferred to facility by: EMS Name of family member notified: Vassie Moselle (Daughter)  [phone] (Mobile) Patient and family notified of of transfer: 12/11/23  Discharge Plan and Services Additional resources added to the After Visit Summary for                                       Social Drivers of Health (SDOH) Interventions SDOH Screenings   Food Insecurity: No Food Insecurity (12/07/2023)  Housing: Low Risk  (12/07/2023)  Transportation Needs: No Transportation Needs (12/07/2023)  Utilities: Not At Risk (12/07/2023)  Social Connections: Unknown (12/07/2023)  Tobacco Use: High Risk (12/08/2023)     Readmission Risk Interventions    12/08/2023   12:37 PM  Readmission Risk Prevention Plan  Post Dischage Appt Complete  Medication Screening Complete  Transportation Screening Complete

## 2023-12-11 NOTE — Plan of Care (Signed)
  Problem: Coping: Goal: Will verbalize positive feelings about self Outcome: Progressing   Problem: Self-Care: Goal: Ability to participate in self-care as condition permits will improve Outcome: Progressing Goal: Verbalization of feelings and concerns over difficulty with self-care will improve Outcome: Progressing   Problem: Education: Goal: Knowledge of General Education information will improve Description: Including pain rating scale, medication(s)/side effects and non-pharmacologic comfort measures Outcome: Progressing   Problem: Activity: Goal: Risk for activity intolerance will decrease Outcome: Progressing

## 2023-12-11 NOTE — Discharge Summary (Signed)
 Physician Discharge Summary  [name]:[id number] DOB: [birth year] DOA: 12/07/2023  PCP: Oneita Hurt, No  Admit date: 12/07/2023 Discharge date: 12/12/2023  Admitted From: Home Disposition: [facility]  Recommendations for Outpatient Follow-up:  Follow up with PCP in 1-2 weeks Outpatient follow-up with orthopedics, [name] in 5 days Outpatient follow-up with neurology Started on aspirin 81 mg p.o. daily, atorvastatin 40 mg p.o. daily for acute CVA   Discharge Condition: Stable CODE STATUS: DNR Diet recommendation:   History of present illness:  Brittany Marks is a 83 y.o. 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  Currently lives alone.  Has had recurrent falls; with reported for [facility] on day of arrival.  Last fall was trying to sit down on her walker and which is slipped out from underneath her and fell forward on outstretched arm causing right shoulder pain.  Denies headache, no visual changes, no chest pain, no palpitations, no fever/chills, no paresthesias, no urinary symptoms.  Continues to endorse tobacco use.  Denies alcohol use.   In the ED, temperature 97.5 F, HR 86, RR 16, BP 166/95, SpO2 100% on 3 L nasal cannula which is her baseline.  WBC 13.7, hemoglobin 14.5, platelet count 264.  Sodium 132, potassium 3.1, chloride 97, CO2 22, glucose 115, BUN 29, creatinine 0.94.  AST 44, ALT 24.  Total bilirubin 0.7.  INR 1.2.  Urinalysis unrevealing.  UDS positive for opiates, otherwise negative.  Right shoulder x-ray with acute obliquely oriented fracture deformity involving the proximal shaft of the right humerus, advanced glenohumeral osteoarthritis.  MR brain with and without contrast with  small acute infarct scattered in the left ACA/MCA distribution, advanced chronic small vessel disease.  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  Hospital course:  Acute CVA Patient presenting to the ED following multiple recurrent falls at home. MR brain with and without contrast with small acute infarct scattered in the left ACA/MCA distribution, advanced chronic small vessel disease.  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  Concern acute CVA related to embolic nature from underlying malignancy.  EKG normal sinus rhythm. TTE: LVEF 40-45%, LV global hypokinesis, mild LVH, grade 2 diastolic dysfunction, RV systolic function mildly reduced, LA severely dilated, mild MR, IVC normal.  Hemoglobin C 5.9, LDL 103.  Started on aspirin 81 mg p.o. daily, atorvastatin 40 mg p.o. daily.  Seen by neurology and now signed off.  Outpatient follow neurology 4-6 weeks.  Discharging to SNF.   Right proximal humerus fracture Right shoulder x-ray with acute obliquely oriented fracture deformity involving the proximal shaft of the right humerus.  Seen by orthopedics, [name] who recommend outpatient follow-up with [name] in 1 week.  Continue nonweightbearing right upper extremity sling in place.   Hypokalemia Repleted  during hospitalization.   History of small cell lung cancer with bony metastasis Follows with medical oncology outpatient, Dr. [name].  Currently on palliative chemotherapy with Imfinzi every 4 weeks.   Chronic hypoxic respiratory failure At baseline on 3 L nasal cannula.  Stable. Albuterol neb as needed for shortness of breath/wheezing   Neuropathy Gabapentin 100 mg p.o. twice daily   Tobacco use disorder Continues to endorse tobacco use daily.  Counseled on need for complete cessation/abstinence. Nicotine patch   Discharge Diagnoses:  Principal Problem:   Acute CVA (cerebrovascular accident) (HCC) Active Problems:   Closed right humeral fracture   Hypokalemia   Recurrent falls   Leukocytosis   Small cell lung cancer, right upper lobe (HCC)   Chronic respiratory failure with hypoxia (HCC)   Chemotherapy-induced neuropathy (HCC)   Tobacco abuse   borderline prolonged QT    Discharge Instructions  Discharge Instructions     Ambulatory referral to Neurology   Complete by: As directed    Follow up with stroke clinic NP at [facility] in about 4-6 weeks. Thanks.   Call MD for:  difficulty breathing, headache or visual disturbances   Complete by: As directed    Call MD for:  difficulty breathing, headache or visual disturbances   Complete by: As directed    Call MD for:  extreme fatigue   Complete by: As directed    Call MD for:  extreme fatigue   Complete by: As directed    Call MD for:  persistant dizziness or light-headedness   Complete by: As directed    Call MD for:  persistant dizziness or light-headedness   Complete by: As directed    Call MD for:  persistant nausea and vomiting   Complete by: As directed    Call MD for:  persistant nausea and vomiting   Complete by: As directed    Call MD for:  severe uncontrolled pain   Complete by: As directed    Call MD for:  severe uncontrolled pain   Complete by: As directed    Call MD for:  temperature >100.4   Complete  by: As directed    Call MD for:  temperature >100.4   Complete by: As directed    Diet - low sodium heart healthy   Complete by: As directed    Increase activity slowly   Complete by: As directed    Increase activity slowly   Complete by: As directed       Allergies as of 12/12/2023       Reactions   Oxycodone Other (See Comments)   Confusion/ near fall        Medication List     STOP taking these medications    hydrocortisone 2.5 % cream       TAKE these medications    acetaminophen 325 MG tablet Commonly known as: TYLENOL Take 2 tablets (650 mg total) by mouth every 4 (four) hours as needed for mild pain (pain score 1-3) (or temp > 37.5 C (99.5 F)).   albuterol 108 (90 Base) MCG/ACT inhaler Commonly known as: VENTOLIN HFA Inhale 2 puffs into the lungs every 6 (six) hours as needed for wheezing or shortness of breath.   aspirin EC 81 MG tablet Take 1 tablet (81 mg total) by mouth daily. Swallow whole.   atorvastatin 40 MG tablet Commonly known as: LIPITOR Take 1 tablet (40 mg total) by mouth  daily.   b complex vitamins tablet Take 1 tablet by mouth daily.   COD LIVER OIL PO Take 1 capsule by mouth daily.   gabapentin 100 MG capsule Commonly known as: NEURONTIN Take 1 capsule (100 mg total) by mouth 2 (two) times daily. What changed:  when to take this additional instructions   ibuprofen 200 MG tablet Commonly known as: ADVIL Take 600 mg by mouth See admin instructions. Take 600 mg by mouth in the morning and evening   lidocaine-prilocaine cream Commonly known as: EMLA Apply 1 Application topically every 30 (thirty) days. What changed: when to take this   multivitamin tablet Take 1 tablet by mouth daily with breakfast.   nicotine 14 mg/24hr patch Commonly known as: NICODERM CQ - dosed in mg/24 hours Place 14 mg onto the skin daily as needed (for the urge to smoke while hospitalized).   ondansetron 4 MG tablet Commonly known as: Zofran Take  1 tablet (4 mg total) by mouth every 8 (eight) hours as needed for nausea or vomiting.   oxymetazoline 0.05 % nasal spray Commonly known as: AFRIN Place 2 sprays into both nostrils 2 (two) times daily as needed (for a bloody nose).   prochlorperazine 10 MG tablet Commonly known as: COMPAZINE Take 1 tablet (10 mg total) by mouth every 6 (six) hours as needed for nausea or vomiting.   senna-docusate 8.6-50 MG tablet Commonly known as: Senokot-S Take 1 tablet by mouth at bedtime as needed for mild constipation.   VITAMIN C PO Take 1 tablet by mouth daily.   VITAMIN E PO Take 1 capsule by mouth daily.        Follow-up Information     Yolonda Kida, MD. Schedule an appointment as soon as possible for a visit in 5 day(s).   Specialty: Orthopedic Surgery Contact information: [address] [place] [address] [phone]         [facility]. Schedule an appointment as soon as possible for a visit in 6 week(s).   Specialty: Neurology Contact information: [address] [place] [address] [phone]               Allergies  Allergen Reactions   Oxycodone Other (See Comments)    Confusion/ near fall    Consultations: Orthopedics, [name] Neurology   Procedures/Studies: VAS [place] LOWER EXTREMITY VENOUS (DVT) Result Date: 12/08/2023  Lower Venous DVT Study Patient Name:  ANNALEI FRIESZ  Date of Exam:   12/08/2023 Medical Rec #: [id number]          Accession #:    [id number] Date of Birth: 1941/01/16          Patient Gender: F Patient Age:   59 years Exam Location:  [facility] Procedure:      VAS [place] LOWER EXTREMITY VENOUS (DVT) Referring Phys: Marvel Plan --------------------------------------------------------------------------------  Indications: Stroke, and Embolic stroke, lung cancer, recent fall/injury.  Comparison Study: No prior exam. Performing Technologist: Fernande Bras  Examination Guidelines: A complete evaluation includes B-mode imaging, spectral Doppler, color Doppler, and power Doppler as needed of all accessible portions of each vessel. Bilateral testing is considered an integral part of a complete examination. Limited examinations for reoccurring indications may be performed as noted. The reflux portion of the exam is performed with the patient in reverse Trendelenburg.  +---------+---------------+---------+-----------+----------+--------------+ RIGHT    CompressibilityPhasicitySpontaneityPropertiesThrombus Aging +---------+---------------+---------+-----------+----------+--------------+ CFV      Full  Yes      Yes                                 +---------+---------------+---------+-----------+----------+--------------+ SFJ      Full           Yes      Yes                                 +---------+---------------+---------+-----------+----------+--------------+ FV Prox  Full                                                        +---------+---------------+---------+-----------+----------+--------------+ FV Mid   Full                                                        +---------+---------------+---------+-----------+----------+--------------+ FV DistalFull                                                        +---------+---------------+---------+-----------+----------+--------------+ PFV      Full                                                        +---------+---------------+---------+-----------+----------+--------------+ POP      Full           Yes      Yes                                 +---------+---------------+---------+-----------+----------+--------------+ PTV      Full                                                        +---------+---------------+---------+-----------+----------+--------------+ PERO     Full                                                         +---------+---------------+---------+-----------+----------+--------------+   +---------+---------------+---------+-----------+----------+--------------+ LEFT     CompressibilityPhasicitySpontaneityPropertiesThrombus Aging +---------+---------------+---------+-----------+----------+--------------+ CFV      Full           Yes      Yes                                 +---------+---------------+---------+-----------+----------+--------------+ SFJ      Full  Yes      Yes                                 +---------+---------------+---------+-----------+----------+--------------+ FV Prox  Full                                                        +---------+---------------+---------+-----------+----------+--------------+ FV Mid   Full                                                        +---------+---------------+---------+-----------+----------+--------------+ FV DistalFull                                                        +---------+---------------+---------+-----------+----------+--------------+ PFV      Full                                                        +---------+---------------+---------+-----------+----------+--------------+ POP      Full           Yes      Yes                                 +---------+---------------+---------+-----------+----------+--------------+ PTV      Full                                                        +---------+---------------+---------+-----------+----------+--------------+ PERO     Full                                                        +---------+---------------+---------+-----------+----------+--------------+     Summary: BILATERAL: - No evidence of deep vein thrombosis seen in the lower extremities, bilaterally. -No evidence of popliteal cyst, bilaterally.   *See table(s) above for measurements and observations. Electronically signed by Coral Else MD on 12/08/2023 at 9:28:12  PM.    Final    ECHOCARDIOGRAM COMPLETE Result Date: 12/08/2023    ECHOCARDIOGRAM REPORT   Patient Name:   MARRI MCNEFF Date of Exam: 12/08/2023 Medical Rec #:  [id number]         Height:       62.0 in Accession #:    [id number]        Weight:       140.3 lb Date of Birth:  02-02-1941         BSA:  1.644 m Patient Age:    82 years          BP:           112/94 mmHg Patient Gender: F                 HR:           80 bpm. Exam Location:  Inpatient Procedure: 2D Echo, Cardiac Doppler and Color Doppler (Both Spectral and Color            Flow Doppler were utilized during procedure). Indications:    Stroke  History:        Patient has no prior history of Echocardiogram examinations.                 Cancer; Risk Factors:Former Smoker.  Sonographer:    Amy Chionchio Referring Phys: [id number] ALLISON WOLFE IMPRESSIONS  1. Limited windows. Left ventricular ejection fraction, by estimation, is 40 to 45%. The left ventricle has mildly decreased function. The left ventricle demonstrates global hypokinesis. There is mild left ventricular hypertrophy. Left ventricular diastolic parameters are consistent with Grade II diastolic dysfunction (pseudonormalization).  2. Right ventricular systolic function is mildly reduced. The right ventricular size is normal. There is normal pulmonary artery systolic pressure. The estimated right ventricular systolic pressure is 24.0 mmHg.  3. Left atrial size was severely dilated.  4. Mild mitral valve regurgitation.  5. The aortic valve was not well visualized. Aortic valve regurgitation is not visualized.  6. The inferior vena cava is normal in size with greater than 50% respiratory variability, suggesting right atrial pressure of 3 mmHg. FINDINGS  Left Ventricle: Limited windows. Left ventricular ejection fraction, by estimation, is 40 to 45%. The left ventricle has mildly decreased function. The left ventricle demonstrates global hypokinesis. Strain imaging was not performed. The left  ventricular internal cavity size was normal in size. There is mild left ventricular hypertrophy. Left ventricular diastolic parameters are consistent with Grade II diastolic dysfunction (pseudonormalization). Right Ventricle: The right ventricular size is normal. Right ventricular systolic function is mildly reduced. There is normal pulmonary artery systolic pressure. The tricuspid regurgitant velocity is 2.29 m/s, and with an assumed right atrial pressure of  3 mmHg, the estimated right ventricular systolic pressure is 24.0 mmHg. Left Atrium: Left atrial size was severely dilated. Right Atrium: Right atrial size was normal in size. Pericardium: There is no evidence of pericardial effusion. Mitral Valve: Mild mitral valve regurgitation. MV peak gradient, 10.1 mmHg. The mean mitral valve gradient is 5.0 mmHg. Tricuspid Valve: Tricuspid valve regurgitation is mild. Aortic Valve: The aortic valve was not well visualized. Aortic valve regurgitation is not visualized. Aortic valve mean gradient measures 7.0 mmHg. Aortic valve peak gradient measures 10.2 mmHg. Aortic valve area, by VTI measures 1.15 cm. Pulmonic Valve: Pulmonic valve regurgitation is not visualized. Aorta: The aortic root and ascending aorta are structurally normal, with no evidence of dilitation. Venous: The inferior vena cava is normal in size with greater than 50% respiratory variability, suggesting right atrial pressure of 3 mmHg. IAS/Shunts: No atrial level shunt detected by color flow Doppler. Additional Comments: 3D imaging was not performed.  LEFT VENTRICLE PLAX 2D LVIDd:         4.80 cm     Diastology LVIDs:         4.10 cm     LV e' medial:    5.33 cm/s LV PW:         0.90 cm  LV E/e' medial:  27.4 LV IVS:        1.10 cm     LV e' lateral:   9.68 cm/s LVOT diam:     2.00 cm     LV E/e' lateral: 15.1 LV SV:         40 LV SV Index:   24 LVOT Area:     3.14 cm  LV Volumes (MOD) LV vol d, MOD A2C: 86.1 ml LV vol d, MOD A4C: 88.3 ml LV vol s,  MOD A2C: 44.8 ml LV vol s, MOD A4C: 46.4 ml LV SV MOD A2C:     41.3 ml LV SV MOD A4C:     88.3 ml LV SV MOD BP:      44.5 ml RIGHT VENTRICLE            IVC RV Basal diam:  3.50 cm    IVC diam: 1.60 cm RV S prime:     5.87 cm/s TAPSE (M-mode): 0.9 cm LEFT ATRIUM              Index        RIGHT ATRIUM           Index LA Vol (A2C):   85.4 ml  51.94 ml/m  RA Area:     13.60 cm LA Vol (A4C):   101.0 ml 61.42 ml/m  RA Volume:   32.50 ml  19.77 ml/m LA Biplane Vol: 94.7 ml  57.59 ml/m  AORTIC VALVE                     PULMONIC VALVE AV Area (Vmax):    1.19 cm      PV Vmax:       0.63 m/s AV Area (Vmean):   0.99 cm      PV Peak grad:  1.6 mmHg AV Area (VTI):     1.15 cm AV Vmax:           160.00 cm/s AV Vmean:          125.000 cm/s AV VTI:            0.350 m AV Peak Grad:      10.2 mmHg AV Mean Grad:      7.0 mmHg LVOT Vmax:         60.60 cm/s LVOT Vmean:        39.300 cm/s LVOT VTI:          0.128 m LVOT/AV VTI ratio: 0.37  AORTA Ao Root diam: 2.70 cm Ao Asc diam:  3.00 cm MITRAL VALVE                TRICUSPID VALVE MV Area (PHT): 4.19 cm     TR Peak grad:   21.0 mmHg MV Area VTI:   1.11 cm     TR Vmax:        229.00 cm/s MV Peak grad:  10.1 mmHg MV Mean grad:  5.0 mmHg     SHUNTS MV Vmax:       1.59 m/s     Systemic VTI:  0.13 m MV Vmean:      111.0 cm/s   Systemic Diam: 2.00 cm MV Decel Time: 181 msec MV E velocity: 146.00 cm/s MV A velocity: 115.00 cm/s MV E/A ratio:  1.27 Carolan Clines Electronically signed by Carolan Clines Signature Date/Time: 12/08/2023/2:11:03 PM    Final    CT ANGIO HEAD NECK W WO CM Result Date: 12/07/2023  CLINICAL DATA:  Acute left ACA and left caudate infarcts. Small cell lung cancer. EXAM: CT ANGIOGRAPHY HEAD AND NECK WITH AND WITHOUT CONTRAST TECHNIQUE: Multidetector CT imaging of the head and neck was performed using the standard protocol during bolus administration of intravenous contrast. Multiplanar CT image reconstructions and MIPs were obtained to evaluate the vascular anatomy.  Carotid stenosis measurements (when applicable) are obtained utilizing NASCET criteria, using the distal internal carotid diameter as the denominator. RADIATION DOSE REDUCTION: This exam was performed according to the departmental dose-optimization program which includes automated exposure control, adjustment of the mA and/or kV according to patient size and/or use of iterative reconstruction technique. CONTRAST:  75mL OMNIPAQUE IOHEXOL 350 MG/ML SOLN COMPARISON:  MR head without and with contrast 12/07/2023. CT head without and with contrast 06/16/2019 FINDINGS: CT HEAD FINDINGS Brain: The acute ACA territory infarcts are below the resolution of CT. Advanced white matter disease is again noted. Remote left basal ganglia infarct is present. Deep gray nuclei are otherwise within normal limits. The ventricles are proportionate to the degree of atrophy. No significant extraaxial fluid collection is present. The brainstem and cerebellum are within normal limits. Midline structures are within normal limits. Vascular: Atherosclerotic calcifications are present within the cavernous internal carotid arteries bilaterally and at the dural margin of the left vertebral artery. No hyperdense vessel is present. Skull: Calvarium is intact. No focal lytic or blastic lesions are present. No significant extracranial soft tissue lesion is present. Sinuses/Orbits: The paranasal sinuses and mastoid air cells are clear. Bilateral lens replacements are noted. Globes and orbits are otherwise unremarkable. Review of the MIP images confirms the above findings CTA NECK FINDINGS Aortic arch: Extensive atherosclerotic calcifications are present the aortic arch and great vessel origins. High-grade, near occlusive stenosis is present at the proximal left subclavian artery. No dissection or aneurysm is present. Right carotid system: The right common carotid artery demonstrates some mural calcification without focal stenosis. Dense atherosclerotic  calcifications are present at the right carotid bifurcation lumen is narrowed to 1 mm. More distal cervical right ICA is within normal limits. Left carotid system: The left common carotid artery demonstrates extensive mural calcifications without focal stenosis relative to the more distal vessel. Atherosclerotic calcifications are present at the bifurcation and proximal left ICA. Lumen is narrowed to 2 mm. The more distal cervical left ICA is of normal caliber. Mural calcification is present in the vessel just below the skull base without focal stenosis. Vertebral arteries: Right vertebral artery is dominant vessel. High-grade stenosis is present at the origin of the left vertebral artery. Calcifications are present in the left vertebral artery in the proximal left V2 segment without a tandem stenosis. Skeleton: Multilevel degenerative changes are present in the cervical spine. Uncovertebral spurring contributes to moderate foraminal stenosis bilaterally at C5-6. No focal osseous lesions are present. Other neck: The soft tissues of the neck are otherwise unremarkable. Salivary glands are within normal limits. Thyroid is normal. No significant adenopathy is present. No focal mucosal or submucosal lesions are present. Upper chest: Post radiation changes are again noted in the left upper lobe. Paraseptal emphysematous changes are present bilaterally. Review of the MIP images confirms the above findings CTA HEAD FINDINGS Anterior circulation: Atherosclerotic calcifications are present within the cavernous internal carotid arteries bilaterally without significant stenosis through the ICA termini. The A1 and M1 segments are normal. The left A1 segment is dominant. The anterior communicating artery is patent. MCA bifurcations are normal bilaterally. The ACA and MCA branch vessels are normal  bilaterally. Posterior circulation: Atherosclerotic calcifications are present at the dural margin of the left vertebral artery  without focal stenosis. The PICA origins are visualized and normal. The vertebrobasilar junction basilar artery is normal. Both posterior cerebral arteries scratched at the superior cerebellar arteries are patent. Both posterior cerebral arteries originate from the basilar tip. Mild narrowing is present in the mid left P2 segment. Venous sinuses: The dural sinuses are patent. The straight sinus and deep cerebral veins are intact. Cortical veins are within normal limits. No significant vascular malformation is evident. Anatomic variants: None Review of the MIP images confirms the above findings IMPRESSION: 1. The acute left ACA territory infarcts are below the resolution of CT. 2. Advanced white matter disease likely reflects the sequela of chronic microvascular ischemia. 3. Remote left basal ganglia infarct. 4. 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 5. High-grade stenosis at the origin of the left vertebral artery. 6. Severe stenosis at the right carotid bifurcation with luminal narrowing of 1 mm. 7. Mild narrowing in the mid left P2 segment. 8. No other significant proximal stenosis, aneurysm, or branch vessel occlusion within the Circle of Willis. 9. Post radiation changes in the left upper lobe. 10. Multilevel degenerative changes in the cervical spine. 11. Aortic Atherosclerosis (ICD10-I70.0) and Emphysema (ICD10-J43.9). Electronically Signed   By: Marin Roberts M.D.   On: 12/07/2023 17:31   MR BRAIN W WO CONTRAST Result Date: 12/07/2023 CLINICAL DATA:  Metastatic disease evaluation EXAM: MRI HEAD WITHOUT AND WITH CONTRAST TECHNIQUE: Multiplanar, multiecho pulse sequences of the brain and surrounding structures were obtained without and with intravenous contrast. CONTRAST:  6mL GADAVIST GADOBUTROL 1 MMOL/ML IV SOLN COMPARISON:  None Available. FINDINGS: Brain: Small foci of restricted diffusion in the anterior left temporal lobe, left caudate head, and parasagittal  left frontal lobe. These span the left MCA and ACA distributions. Extensive chronic small vessel ischemia in the cerebral white matter with confluent FLAIR hyperintensity. 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 Small chronic right frontal microhemorrhage Vascular: Major flow voids and vascular enhancements are preserved. Skull and upper cervical spine: Normal marrow signal. Degeneration with C2-3 facet ankylosis. Sinuses/Orbits: Negative IMPRESSION: Small acute infarcts scattered in the left ACA and MCA distribution. Advanced chronic small vessel disease. Electronically Signed   By: Tiburcio Pea M.D.   On: 12/07/2023 12:38   DG Humerus Right Result Date: 12/07/2023 CLINICAL DATA:  Status post fall.  Decreased mobility. EXAM: RIGHT HUMERUS - 2+ VIEW COMPARISON:  None Available. FINDINGS: There is an acute, obliquely oriented, fracture deformity involving the proximal shaft of the right humerus. There is lateral and posterior angulation of the distal fracture fragments. Advanced arthro pathic changes involving the right glenohumeral joint. IMPRESSION: Acute, obliquely oriented, fracture deformity involving the proximal shaft of the right humerus. Electronically Signed   By: Signa Kell M.D.   On: 12/07/2023 10:26   DG Shoulder Right Result Date: 12/07/2023 CLINICAL DATA:  Fall.  Pain and decreased mobility. EXAM: RIGHT SHOULDER - 2+ VIEW COMPARISON:  None Available. FINDINGS: There is and acute, obliquely oriented fracture deformity involving the proximal shaft of the right humerus. There is posterior and lateral angulation of the distal fracture fragments. Advanced arthro pathic changes are noted involving the glenohumeral joint. Foreshortening of the right clavicle may be posttraumatic or postsurgical. IMPRESSION: 1. Acute, obliquely oriented fracture deformity involving the proximal shaft of the right humerus. 2. Advanced glenohumeral osteoarthritis. Electronically Signed   By:  Veronda Prude.D.  On: 12/07/2023 10:24     Subjective: Patient seen examined bedside, lying in bed.  Asking for assistance ordering her breakfast.  Discharging to SNF today.  No other questions, concerns or complaints at this time.  Denies headache, no dizziness, no chest pain, no palpitations, no shortness of breath, no abdominal pain, no fever/chills/night sweats, no nausea/vomiting/diarrhea, no focal weakness, no fatigue, no paresthesia.  No acute events overnight per nursing.  Discharge Exam: Vitals:   12/11/23 2326 12/12/23 0421  BP: 125/71 118/79  Pulse: 83 85  Resp: (!) 21   Temp: 98.7 F (37.1 C) 98.3 F (36.8 C)  SpO2: 94% 94%   Vitals:   12/11/23 1938 12/11/23 2207 12/11/23 2326 12/12/23 0421  BP: (!) 148/70 114/61 125/71 118/79  Pulse: 88 89 83 85  Resp: 20 18 (!) 21   Temp: 97.6 F (36.4 C)  98.7 F (37.1 C) 98.3 F (36.8 C)  TempSrc: Oral  Oral Oral  SpO2: 97% 93% 94% 94%  Weight:      Height:        Physical Exam: GEN: NAD, alert and oriented x 3, chronically ill appearance, appears older than stated age HEENT: NCAT, PERRL, EOMI, sclera clear, MMM PULM: CTAB w/o wheezes/crackles, normal respiratory effort, on 3 L nasal cannula which is her baseline with SpO2 93% at rest CV: RRR w/o M/G/R GI: abd soft, NTND, NABS, no R/G/M MSK: no peripheral edema, right upper extremity in sling, neurovascular intact, otherwise moves all extremities independently NEURO: No focal neurological deficit PSYCH: normal mood/affect Integumentary: No concerning rashes/lesions/wounds noted on exposed skin surfaces    The results of significant diagnostics from this hospitalization (including imaging, microbiology, ancillary and laboratory) are listed below for reference.     Microbiology: No results found for this or any previous visit (from the past 240 hours).   Labs: BNP (last 3 results) No results for input(s): "BNP" in the last 8760 hours. Basic Metabolic  Panel: Recent Labs  Lab 12/07/23 0954 12/07/23 1557 12/07/23 1605 12/09/23 0406  NA 132*  --  135 135  K 3.1*  --  4.2 4.1  CL 97*  --  100 104  CO2 22  --   --  23  GLUCOSE 115*  --  123* 85  BUN 29*  --  25* 21  CREATININE 0.94  --  1.00 0.60  CALCIUM 8.5*  --   --  8.5*  MG  --  2.0  --  1.9   Liver Function Tests: Recent Labs  Lab 12/07/23 1557 12/09/23 0406  AST 44* 33  ALT 24 20  ALKPHOS 68 56  BILITOT 0.7 1.1  PROT 6.7 6.2*  ALBUMIN 3.3* 3.2*   No results for input(s): "LIPASE", "AMYLASE" in the last 168 hours. No results for input(s): "AMMONIA" in the last 168 hours. CBC: Recent Labs  Lab 12/07/23 0954 12/07/23 1605 12/09/23 0859  WBC 13.7*  --  9.6  NEUTROABS 11.5*  --   --   HGB 14.5 13.9 13.0  HCT 44.7 41.0 39.9  MCV 99.3  --  99.0  PLT 264  --  217   Cardiac Enzymes: No results for input(s): "CKTOTAL", "CKMB", "CKMBINDEX", "TROPONINI" in the last 168 hours. BNP: Invalid input(s): "POCBNP" CBG: No results for input(s): "GLUCAP" in the last 168 hours. D-Dimer No results for input(s): "DDIMER" in the last 72 hours. Hgb A1c No results for input(s): "HGBA1C" in the last 72 hours. Lipid Profile No results for input(s): "CHOL", "HDL", "  LDLCALC", "TRIG", "CHOLHDL", "LDLDIRECT" in the last 72 hours. Thyroid function studies No results for input(s): "TSH", "T4TOTAL", "T3FREE", "THYROIDAB" in the last 72 hours.  Invalid input(s): "FREET3" Anemia work up No results for input(s): "VITAMINB12", "FOLATE", "FERRITIN", "TIBC", "IRON", "RETICCTPCT" in the last 72 hours. Urinalysis    Component Value Date/Time   COLORURINE YELLOW 12/07/2023 1719   APPEARANCEUR CLEAR 12/07/2023 1719   LABSPEC 1.023 12/07/2023 1719   PHURINE 5.0 12/07/2023 1719   GLUCOSEU NEGATIVE 12/07/2023 1719   HGBUR NEGATIVE 12/07/2023 1719   BILIRUBINUR NEGATIVE 12/07/2023 1719   KETONESUR NEGATIVE 12/07/2023 1719   PROTEINUR NEGATIVE 12/07/2023 1719   UROBILINOGEN 0.2 07/23/2013  1332   NITRITE NEGATIVE 12/07/2023 1719   LEUKOCYTESUR TRACE (A) 12/07/2023 1719   Sepsis Labs Recent Labs  Lab 12/07/23 0954 12/09/23 0859  WBC 13.7* 9.6   Microbiology No results found for this or any previous visit (from the past 240 hours).   Time coordinating discharge: Over 30 minutes  SIGNED:   Alvira Philips [place], DO  [facility] 12/12/2023, 9:10 AM

## 2023-12-11 NOTE — Plan of Care (Signed)
 Continue to monitor patients BP through night.  Was low around 1430, and started fluid bolus.  Will hold on discharge until tomorrow.  BP has improved with fluid challenge.  Patient remains alert and oriented but does appear very fatigued.

## 2023-12-12 DIAGNOSIS — I639 Cerebral infarction, unspecified: Secondary | ICD-10-CM | POA: Diagnosis not present

## 2023-12-12 MED ORDER — HEPARIN SOD (PORK) LOCK FLUSH 100 UNIT/ML IV SOLN
500.0000 [IU] | INTRAVENOUS | Status: AC | PRN
  Administered 2023-12-12: 500 [IU]

## 2023-12-12 NOTE — Progress Notes (Signed)
 [facility] place SNF room 508 nurse Luther Parody) at [phone] and gave report.

## 2023-12-12 NOTE — Progress Notes (Signed)
 PROGRESS NOTE    Brittany Marks  SWF:[id number] DOB: 07-Nov-1940 DOA: 12/07/2023 PCP: Oneita Hurt, No    Brief Narrative:   Brittany Marks is a 83 y.o. 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  Currently lives alone.  Has had recurrent falls; with reported for [name] on day of arrival.  Last fall was trying to sit down on her walker and which is slipped out from underneath her and fell forward on outstretched arm causing right shoulder pain.  Denies headache, no visual changes, no chest pain, no palpitations, no fever/chills, no paresthesias, no urinary symptoms.  Continues to endorse tobacco use.  Denies alcohol use.  In the ED, temperature 97.5 F, HR 86, RR 16, BP 166/95, SpO2 100% on 3 L nasal cannula which is her baseline.  WBC 13.7, hemoglobin 14.5, platelet count 264.  Sodium 132, potassium 3.1, chloride 97, CO2 22, glucose 115, BUN 29, creatinine 0.94.  AST 44, ALT 24.  Total bilirubin 0.7.  INR 1.2.  Urinalysis unrevealing.  UDS positive for opiates, otherwise negative.  Right shoulder x-ray with acute obliquely oriented fracture deformity involving the proximal shaft of the right humerus, advanced glenohumeral osteoarthritis.  MR brain with and without contrast with small acute infarct scattered in the left ACA/MCA distribution, advanced chronic small vessel disease.  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  Assessment & Plan:   Acute CVA Patient presenting to the ED following multiple recurrent falls at home. MR brain with and without contrast with small acute infarct scattered in the left ACA/MCA distribution, advanced chronic small vessel disease.  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  Concern acute CVA related to embolic nature from underlying malignancy.  EKG normal sinus rhythm. TTE: LVEF 40-45%, LV global hypokinesis, mild LVH, grade 2 diastolic dysfunction, RV systolic function mildly reduced, LA severely dilated, mild MR, IVC normal.  Seen by neurology and now signed off. -- LDL 103 -- Hemoglobin A1c: 5.9 -- Atorvastatin 40 mg p.o. daily -- Aspirin 81 mg p.o. daily -- Monitor on telemetry -- Outpatient follow-up with neurology 4 to 6 weeks -- PT/OT: Recommending SNF placement; TOC consulted  Right proximal humerus fracture Right shoulder x-ray with acute obliquely oriented fracture deformity involving the proximal shaft of the right humerus.  Seen by orthopedics, [name] who recommend outpatient follow-up with [name] in 1 week. -- NWB RUE with sling in place -- Tylenol as needed mild pain  Hypotension/vasovagal event: Resolved Patient with fatigue, noted to be hypotensive likely secondary to vasovagal event with sling pushing on carotid artery.  Improved with IV fluid bolus.  Now back to baseline.  Hypokalemia Repleted.  Potassium 4.1 this morning.  History  of small cell lung cancer with bony metastasis Follows with medical oncology outpatient, Dr. [name].  Currently on  palliative chemotherapy with Imfinzi every 4 weeks.  Chronic hypoxic respiratory failure At baseline on 3 L nasal cannula.  Stable. -- Albuterol neb as needed for shortness of breath/wheezing  Neuropathy -- Gabapentin 100 mg p.o. twice daily  Tobacco use disorder Continues to endorse tobacco use daily.  Counseled on need for complete cessation/abstinence -- Nicotine patch   DVT prophylaxis: enoxaparin (LOVENOX) injection 40 mg Start: 12/07/23 2200    Code Status: Limited: Do not attempt resuscitation (DNR) -DNR-LIMITED -Do Not Intubate/DNI  Family Communication: No family present at bedside this morning  Disposition Plan:  Level of care: Progressive Status is: Inpatient Remains inpatient appropriate because: Pending SNF placement    Consultants:  Neurology Orthopedics, [name]  Procedures:  TTE:   Antimicrobials:  None   Subjective: Patient seen examined bedside, lying in bed.  No specific complaints this morning.  RN present at bedside.  Pain controlled. Denies headache, no vision changes, no chest pain, no palpitations, no shortness of breath, no abdominal pain, no fever/chills/night sweats, no nausea/vomiting/diarrhea, no focal weakness, no fatigue, no paresthesias.  No acute events overnight per nursing staff.  Medically stable for discharge to SNF once bed available.  Objective: Vitals:   12/11/23 1938 12/11/23 2207 12/11/23 2326 12/12/23 0421  BP: (!) 148/70 114/61 125/71 118/79  Pulse: 88 89 83 85  Resp: 20 18 (!) 21   Temp: 97.6 F (36.4 C)  98.7 F (37.1 C) 98.3 F (36.8 C)  TempSrc: Oral  Oral Oral  SpO2: 97% 93% 94% 94%  Weight:      Height:        Intake/Output Summary (Last 24 hours) at 12/12/2023 0908 Last data filed at 12/12/2023 0500 Gross per 24 hour  Intake 660 ml  Output 1850 ml  Net -1190 ml   Filed Weights   12/07/23 0906  Weight: 63.6 kg    Examination:  Physical Exam: GEN: NAD, alert and oriented x 3, chronically ill  appearance, appears older than stated age HEENT: NCAT, PERRL, EOMI, sclera clear, MMM PULM: CTAB w/o wheezes/crackles, normal respiratory effort, on 3 L nasal cannula which is her baseline with SpO2 93% at rest CV: RRR w/o M/G/R GI: abd soft, NTND, NABS, no R/G/M MSK: no peripheral edema, right upper extremity in sling, neurovascular intact, otherwise moves all extremities independently NEURO: No focal neurological deficit PSYCH: normal mood/affect Integumentary: No concerning rashes/lesions/wounds noted on exposed skin surfaces    Data Reviewed: I have personally reviewed following labs and imaging studies  CBC: Recent Labs  Lab 12/07/23 0954 12/07/23 1605 12/09/23 0859  WBC 13.7*  --  9.6  NEUTROABS 11.5*  --   --   HGB 14.5 13.9 13.0  HCT 44.7 41.0 39.9  MCV 99.3  --  99.0  PLT 264  --  217   Basic Metabolic Panel: Recent Labs  Lab 12/07/23 0954 12/07/23 1557 12/07/23 1605 12/09/23 0406  NA 132*  --  135 135  K 3.1*  --  4.2 4.1  CL 97*  --  100 104  CO2 22  --   --  23  GLUCOSE 115*  --  123* 85  BUN 29*  --  25* 21  CREATININE 0.94  --  1.00 0.60  CALCIUM 8.5*  --   --  8.5*  MG  --  2.0  --  1.9   GFR: Estimated Creatinine  Clearance: 47.5 mL/min (by C-G formula based on SCr of 0.6 mg/dL). Liver Function Tests: Recent Labs  Lab 12/07/23 1557 12/09/23 0406  AST 44* 33  ALT 24 20  ALKPHOS 68 56  BILITOT 0.7 1.1  PROT 6.7 6.2*  ALBUMIN 3.3* 3.2*   No results for input(s): "LIPASE", "AMYLASE" in the last 168 hours. No results for input(s): "AMMONIA" in the last 168 hours. Coagulation Profile: Recent Labs  Lab 12/07/23 1557  INR 1.2   Cardiac Enzymes: No results for input(s): "CKTOTAL", "CKMB", "CKMBINDEX", "TROPONINI" in the last 168 hours. BNP (last 3 results) No results for input(s): "PROBNP" in the last 8760 hours. HbA1C: No results for input(s): "HGBA1C" in the last 72 hours.  CBG: No results for input(s): "GLUCAP" in the last 168  hours. Lipid Profile: No results for input(s): "CHOL", "HDL", "LDLCALC", "TRIG", "CHOLHDL", "LDLDIRECT" in the last 72 hours.  Thyroid Function Tests: No results for input(s): "TSH", "T4TOTAL", "FREET4", "T3FREE", "THYROIDAB" in the last 72 hours.  Anemia Panel: No results for input(s): "VITAMINB12", "FOLATE", "FERRITIN", "TIBC", "IRON", "RETICCTPCT" in the last 72 hours.  Sepsis Labs: No results for input(s): "PROCALCITON", "LATICACIDVEN" in the last 168 hours.  No results found for this or any previous visit (from the past 240 hours).       Radiology Studies: No results found.       Scheduled Meds:  aspirin EC  81 mg Oral Daily   atorvastatin  40 mg Oral Daily   Chlorhexidine Gluconate Cloth  6 each Topical Daily   enoxaparin (LOVENOX) injection  40 mg Subcutaneous QHS   gabapentin  100 mg Oral BID   multivitamin with minerals  1 tablet Oral Q breakfast   nicotine  14 mg Transdermal Daily   sodium chloride flush  10-40 mL Intracatheter Q12H   sodium chloride flush  3-10 mL Intravenous Q12H   Continuous Infusions:   LOS: 5 days    Time spent: 48 minutes spent on chart review, discussion with nursing staff, consultants, updating family and interview/physical exam; more than 50% of that time was spent in counseling and/or coordination of care.    Alvira Philips [place], DO [facility] Available via Epic secure chat 7am-7pm After these hours, please refer to coverage provider listed on [email or web address] 12/12/2023, 9:08 AM

## 2023-12-12 NOTE — TOC Transition Note (Signed)
 Transition of Care [facility]) - Discharge Note  Patient Details  Name: Brittany Marks MRN: [id number] Date of Birth: August 13, 1941  [facility] CM/SW Contact:  Ewing Schlein, LCSW Phone Number: 12/12/2023, 10:13 AM  Clinical Narrative: Patient is medically ready to discharge to [facility]. The number for report is [phone]. Discharge summary, discharge orders, and SNF transfer report faxed to facility in hub. Medical necessity form done; PTAR scheduled. Discharge packet completed. CSW notified daughter, Brittany Marks, of transportation being set up. RN updated. TOC signing off.     Final next level of care: Skilled Nursing Facility Barriers to Discharge: Barriers Resolved  Patient Goals and CMS Choice Patient states their goals for this hospitalization and ongoing recovery are:: SNf to get stonger CMS Medicare.gov Compare Post Acute Care list provided to:: Patient Choice offered to / list presented to : Patient, Adult Children  Discharge Placement         Patient chooses bed at: [facility] Patient to be transferred to facility by: PTAR Name of family member notified: Brittany Marks (daughter) Patient and family notified of of transfer: 12/12/23  Discharge Plan and Services Additional resources added to the After Visit Summary for        DME Arranged: N/A DME Agency: NA  Social Drivers of Health (SDOH) Interventions SDOH Screenings   Food Insecurity: No Food Insecurity (12/07/2023)  Housing: Low Risk  (12/07/2023)  Transportation Needs: No Transportation Needs (12/07/2023)  Utilities: Not At Risk (12/07/2023)  Social Connections: Unknown (12/07/2023)  Tobacco Use: High Risk (12/08/2023)   Readmission Risk Interventions    12/08/2023   12:37 PM  Readmission Risk Prevention Plan  Post Dischage Appt Complete  Medication Screening Complete  Transportation Screening Complete

## 2023-12-12 NOTE — Progress Notes (Signed)
 Occupational Therapy Treatment Patient Details Name: Brittany Marks MRN: [id number] DOB: [birth year] Today's Date: 12/12/2023   History of present illness Patient is a 83 year old female who was admitted on 3/1 with right shoulder pain. Patient was admitted with acute CVA, right proximal humerus fracture, hypokalemia. 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   OT comments  Pt with slower progress toward ADL goals due to restricted use of R dominant UE and baseline L shoulder impairments hindering ability to reach requiring up to Total A for UB/LB ADLs. Pt required Mod-Total A for bed mobility, Mod A for standing at bedside but noted to quickly fatigue with these tasks. BP WFL during session (140/80). Pt hopeful to DC to rehab today.       If plan is discharge home, recommend the following:  A lot of help with walking and/or transfers;A lot of help with bathing/dressing/bathroom;Assistance with cooking/housework;Direct supervision/assist for medications management;Assist for transportation;Help with stairs or ramp for entrance;Direct supervision/assist for financial management;Assistance with feeding;Two people to help with walking and/or transfers   Equipment Recommendations  Other (comment) (TBD)    Recommendations for Other Services      Precautions / Restrictions Precautions Precautions: Fall Recall of Precautions/Restrictions: Impaired Precaution/Restrictions Comments: Home oxygen 3lts Required Braces or Orthoses: Sling Restrictions Weight Bearing Restrictions Per Provider Order: Yes RUE Weight Bearing Per Provider Order: Non weight bearing       Mobility Bed Mobility Overal bed mobility: Needs Assistance Bed Mobility: Supine to Sit, Sit to Supine     Supine to sit: Mod assist, HOB elevated Sit to supine: Total assist   General bed mobility comments: cues to bring LE off  of bed with some assist needed from OT. handheld assist to lift trunk with LUE w/ assist needed to scoot toward EOB. Total A for return to supine as pt reported unable to use LUE on bedrail to assist guiding trunk    Transfers Overall transfer level: Needs assistance Equipment used: 1 person hand held assist Transfers: Sit to/from Stand Sit to Stand: Mod assist           General transfer comment: Mod A to stand at bedside to adjust bedpad. declined OOB transfer this AM     Balance Overall balance assessment: Needs assistance Sitting-balance support: Feet supported, No upper extremity supported Sitting balance-Leahy Scale: Fair Sitting balance - Comments: able to sit statically once positioned correctly, LOb noted with dynamic challenges like kicking LE out w/ Min-mod A to correct Postural control: Posterior lean Standing balance support: Single extremity supported Standing balance-Leahy Scale: Poor                             ADL either performed or assessed with clinical judgement   ADL Overall ADL's : Needs assistance/impaired   Eating/Feeding Details (indicate cue type and reason): pt with difficulty reaching to scratch eye but able to reach to mouth. discussed with NT- with positional strategies, pt has been able to feed self with L hand but it has been difficult. pt declined further ADL assessments this AM             Upper Body Dressing : Total assistance;Bed level;Sitting Upper Body Dressing Details (indicate cue type and reason): Total A for sling mgmt due to baseline L UE impairments and new RUE restrictions Lower Body Dressing: Total assistance;Bed level  General ADL Comments: Plcaed bed control remote within reach to allow pt to adjust bed for preferred comfort. pt reports "i've just been letting the staff do it" reinforced working to regain independence    Extremity/Trunk Assessment Upper Extremity Assessment Upper Extremity  Assessment: Right hand dominant;RUE deficits/detail;LUE deficits/detail RUE Deficits / Details: mild edema noted in hand, elevated UE at end of session today. Pt reports being "very right handed" at baseline RUE Coordination: decreased fine motor;decreased gross motor LUE Deficits / Details: baseline shoulder flexion impairments with flexion to approx 25*. Reports impaired since tumor removed a few years ago, unable to reach up to face with this UE or reach across abdomen to scratch itch on R arm LUE Coordination: decreased gross motor   Lower Extremity Assessment Lower Extremity Assessment: Defer to PT evaluation        Vision   Vision Assessment?: No apparent visual deficits   Perception     Praxis     Communication Communication Communication: No apparent difficulties   Cognition Arousal: Alert Behavior During Therapy: WFL for tasks assessed/performed, Flat affect Cognition: Cognition impaired     Awareness: Intellectual awareness intact, Online awareness impaired   Attention impairment (select first level of impairment): Selective attention Executive functioning impairment (select all impairments): Reasoning, Problem solving, Initiation OT - Cognition Comments: flat affect, follows directions consistently and able to recall plan for SNF and BP issues. Pt requires cues to attempt tasks before assist given- decreased problem solving and initiation noted to maximize independence during session                 Following commands: Intact        Cueing   Cueing Techniques: Verbal cues, Visual cues  Exercises      Shoulder Instructions       General Comments      Pertinent Vitals/ Pain       Pain Assessment Pain Assessment: No/denies pain  Home Living                                          Prior Functioning/Environment              Frequency  Min 1X/week        Progress Toward Goals  OT Goals(current goals can now be found  in the care plan section)  Progress towards OT goals: OT to reassess next treatment  Acute Rehab OT Goals Patient Stated Goal: go to rehab today OT Goal Formulation: Patient unable to participate in goal setting Time For Goal Achievement: 12/22/23 Potential to Achieve Goals: Fair ADL Goals Pt Will Perform Grooming: with supervision;sitting Pt Will Perform Lower Body Dressing: with supervision;with adaptive equipment;sit to/from stand Pt Will Transfer to Toilet: with supervision;ambulating;regular height toilet Pt Will Perform Toileting - Clothing Manipulation and hygiene: with min assist;sitting/lateral leans  Plan      Co-evaluation                 AM-PAC OT "6 Clicks" Daily Activity     Outcome Measure   Help from another person eating meals?: A Little Help from another person taking care of personal grooming?: A Lot Help from another person toileting, which includes using toliet, bedpan, or urinal?: Total Help from another person bathing (including washing, rinsing, drying)?: A Lot Help from another person to put on and taking off regular upper body clothing?: Total  Help from another person to put on and taking off regular lower body clothing?: Total 6 Click Score: 10    End of Session Equipment Utilized During Treatment: Oxygen  OT Visit Diagnosis: Unsteadiness on feet (R26.81);Other abnormalities of gait and mobility (R26.89);Pain Pain - Right/Left: Right Pain - part of body: Shoulder   Activity Tolerance Patient limited by fatigue   Patient Left in bed;with call bell/phone within reach   Nurse Communication Other (comment) (BP; discussed with NT)        Time: 4098-1191 OT Time Calculation (min): 22 min  Charges: OT General Charges $OT Visit: 1 Visit OT Treatments $Therapeutic Activity: 8-22 mins  Bradd Canary, OTR/L Acute Rehab Services Office: [phone]   Lorre Munroe 12/12/2023, 8:05 AM

## 2023-12-18 ENCOUNTER — Encounter: Payer: Self-pay | Admitting: Oncology

## 2023-12-18 ENCOUNTER — Encounter: Payer: Self-pay | Admitting: Internal Medicine

## 2023-12-19 ENCOUNTER — Other Ambulatory Visit: Payer: Self-pay

## 2023-12-24 ENCOUNTER — Other Ambulatory Visit: Payer: Self-pay | Admitting: Internal Medicine

## 2023-12-24 DIAGNOSIS — C3411 Malignant neoplasm of upper lobe, right bronchus or lung: Secondary | ICD-10-CM

## 2023-12-25 ENCOUNTER — Other Ambulatory Visit: Payer: Self-pay

## 2023-12-31 ENCOUNTER — Inpatient Hospital Stay: Payer: Federal, State, Local not specified - PPO

## 2023-12-31 ENCOUNTER — Inpatient Hospital Stay: Payer: Federal, State, Local not specified - PPO | Attending: Internal Medicine

## 2023-12-31 ENCOUNTER — Other Ambulatory Visit: Payer: Self-pay

## 2023-12-31 ENCOUNTER — Inpatient Hospital Stay (HOSPITAL_BASED_OUTPATIENT_CLINIC_OR_DEPARTMENT_OTHER): Payer: Federal, State, Local not specified - PPO | Admitting: Internal Medicine

## 2023-12-31 VITALS — BP 87/56 | HR 69 | Temp 98.1°F | Resp 17 | Wt 144.0 lb

## 2023-12-31 VITALS — BP 106/76

## 2023-12-31 DIAGNOSIS — Z7982 Long term (current) use of aspirin: Secondary | ICD-10-CM | POA: Diagnosis not present

## 2023-12-31 DIAGNOSIS — I700 Atherosclerosis of aorta: Secondary | ICD-10-CM | POA: Insufficient documentation

## 2023-12-31 DIAGNOSIS — C7951 Secondary malignant neoplasm of bone: Secondary | ICD-10-CM | POA: Insufficient documentation

## 2023-12-31 DIAGNOSIS — C3411 Malignant neoplasm of upper lobe, right bronchus or lung: Secondary | ICD-10-CM | POA: Insufficient documentation

## 2023-12-31 DIAGNOSIS — Z79899 Other long term (current) drug therapy: Secondary | ICD-10-CM | POA: Diagnosis not present

## 2023-12-31 DIAGNOSIS — Z5112 Encounter for antineoplastic immunotherapy: Secondary | ICD-10-CM | POA: Insufficient documentation

## 2023-12-31 DIAGNOSIS — Z87891 Personal history of nicotine dependence: Secondary | ICD-10-CM | POA: Diagnosis not present

## 2023-12-31 DIAGNOSIS — Z8673 Personal history of transient ischemic attack (TIA), and cerebral infarction without residual deficits: Secondary | ICD-10-CM | POA: Insufficient documentation

## 2023-12-31 DIAGNOSIS — Z9981 Dependence on supplemental oxygen: Secondary | ICD-10-CM | POA: Diagnosis not present

## 2023-12-31 DIAGNOSIS — Z95828 Presence of other vascular implants and grafts: Secondary | ICD-10-CM

## 2023-12-31 LAB — CMP (CANCER CENTER ONLY)
ALT: 13 U/L (ref 0–44)
AST: 22 U/L (ref 15–41)
Albumin: 3.8 g/dL (ref 3.5–5.0)
Alkaline Phosphatase: 78 U/L (ref 38–126)
Anion gap: 6 (ref 5–15)
BUN: 23 mg/dL (ref 8–23)
CO2: 28 mmol/L (ref 22–32)
Calcium: 9.4 mg/dL (ref 8.9–10.3)
Chloride: 94 mmol/L — ABNORMAL LOW (ref 98–111)
Creatinine: 0.66 mg/dL (ref 0.44–1.00)
GFR, Estimated: >60 mL/min (ref >60)
Glucose, Bld: 131 mg/dL — ABNORMAL HIGH (ref 70–99)
Potassium: 3.5 mmol/L (ref 3.5–5.1)
Sodium: 128 mmol/L — ABNORMAL LOW (ref 135–145)
Total Bilirubin: 0.6 mg/dL (ref 0.0–1.2)
Total Protein: 7.1 g/dL (ref 6.5–8.1)

## 2023-12-31 LAB — CBC WITH DIFFERENTIAL (CANCER CENTER ONLY)
Abs Immature Granulocytes: 0.03 10*3/uL (ref 0.00–0.07)
Basophils Absolute: 0 10*3/uL (ref 0.0–0.1)
Basophils Relative: 0 %
Eosinophils Absolute: 0.2 10*3/uL (ref 0.0–0.5)
Eosinophils Relative: 2 %
HCT: 34.2 % — ABNORMAL LOW (ref 36.0–46.0)
Hemoglobin: 12.2 g/dL (ref 12.0–15.0)
Immature Granulocytes: 0 %
Lymphocytes Relative: 8 %
Lymphs Abs: 0.9 10*3/uL (ref 0.7–4.0)
MCH: 32.5 pg (ref 26.0–34.0)
MCHC: 35.7 g/dL (ref 30.0–36.0)
MCV: 91.2 fL (ref 80.0–100.0)
Monocytes Absolute: 0.9 10*3/uL (ref 0.1–1.0)
Monocytes Relative: 8 %
Neutro Abs: 8.7 10*3/uL — ABNORMAL HIGH (ref 1.7–7.7)
Neutrophils Relative %: 82 %
Platelet Count: 282 10*3/uL (ref 150–400)
RBC: 3.75 MIL/uL — ABNORMAL LOW (ref 3.87–5.11)
RDW: 12 % (ref 11.5–15.5)
WBC Count: 10.8 10*3/uL — ABNORMAL HIGH (ref 4.0–10.5)
nRBC: 0 % (ref 0.0–0.2)

## 2023-12-31 MED ORDER — SODIUM CHLORIDE 0.9 % IV SOLN
INTRAVENOUS | Status: AC
Start: 2023-12-31 — End: 2023-12-31

## 2023-12-31 MED ORDER — HEPARIN SOD (PORK) LOCK FLUSH 100 UNIT/ML IV SOLN: 500.0000 [IU] | Freq: Once | INTRAVENOUS | Status: DC | PRN

## 2023-12-31 MED ORDER — SODIUM CHLORIDE 0.9 % IV SOLN
1500.0000 mg | Freq: Once | INTRAVENOUS | Status: AC
  Administered 2023-12-31: 1500 mg via INTRAVENOUS
  Filled 2023-12-31: qty 30

## 2023-12-31 MED ORDER — SODIUM CHLORIDE 0.9% FLUSH
10.0000 mL | INTRAVENOUS | Status: DC | PRN
  Administered 2023-12-31: 10 mL via INTRAVENOUS

## 2023-12-31 MED ORDER — SODIUM CHLORIDE 0.9% FLUSH: 10.0000 mL | INTRAVENOUS | Status: DC | PRN

## 2023-12-31 NOTE — Patient Instructions (Signed)
 [facility] - A DEPT OF [facility]  Discharge Instructions: Thank you for choosing [facility] to provide your oncology and hematology care.   If you have a lab appointment with the Cancer Center, please go directly to the Cancer Center and check in at the registration area.   Wear comfortable clothing and clothing appropriate for easy access to any Portacath or PICC line.   We strive to give you quality time with your provider. You may need to reschedule your appointment if you arrive late (15 or more minutes).  Arriving late affects you and other patients whose appointments are after yours.  Also, if you miss three or more appointments without notifying the office, you may be dismissed from the clinic at the provider's discretion.      For prescription refill requests, have your pharmacy contact our office and allow 72 hours for refills to be completed.    Today you received the following chemotherapy and/or immunotherapy agents :  durvalumab      To help prevent nausea and vomiting after your treatment, we encourage you to take your nausea medication as directed.  BELOW ARE SYMPTOMS THAT SHOULD BE REPORTED IMMEDIATELY: *FEVER GREATER THAN 100.4 F (38 C) OR HIGHER *CHILLS OR SWEATING *NAUSEA AND VOMITING THAT IS NOT CONTROLLED WITH YOUR NAUSEA MEDICATION *UNUSUAL SHORTNESS OF BREATH *UNUSUAL BRUISING OR BLEEDING *URINARY PROBLEMS (pain or burning when urinating, or frequent urination) *BOWEL PROBLEMS (unusual diarrhea, constipation, pain near the anus) TENDERNESS IN MOUTH AND THROAT WITH OR WITHOUT PRESENCE OF ULCERS (sore throat, sores in mouth, or a toothache) UNUSUAL RASH, SWELLING OR PAIN  UNUSUAL VAGINAL DISCHARGE OR ITCHING   Items with * indicate a potential emergency and should be followed up as soon as possible or go to the Emergency Department if any problems should occur.  Please show the CHEMOTHERAPY ALERT CARD or  IMMUNOTHERAPY ALERT CARD at check-in to the Emergency Department and triage nurse.  Should you have questions after your visit or need to cancel or reschedule your appointment, please contact [facility] - A DE[facility]  Dept: [phone]  and follow the prompts.  Office hours are 8:00 a.m. to 4:30 p.m. Monday - Friday. Please note that voicemails left after 4:00 p.m. may not be returned until the following business day.  We are closed weekends and major holidays. You have access to a nurse at all times for urgent questions. Please call the main number to the clinic Dept: [phone] and follow the prompts.   For any non-urgent questions, you may also contact your provider using MyChart. We now offer e-Visits for anyone 30 and older to request care online for non-urgent symptoms. For details visit [email or web address].   Also download the MyChart app! Go to the app store, search "MyChart", open the app, select [place], and log in with your MyChart username and password.

## 2023-12-31 NOTE — Progress Notes (Signed)
 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   PRIOR THERAPY: None   CURRENT THERAPY: Palliative systemic chemotherapy with carboplatin for AUC of 5 on day 1, etoposide 100 mg/M2 on days 1, 2 and 3 as well as Imfinzi 1500 mg every 3 weeks with the chemotherapy and Neulasta support. First dose on 03/16/2019. Status post 63 cycles.   Starting from cycle #5 the patient will be treated with maintenance treatment with Imfinzi 1500 mg IV every 4 weeks.  INTERVAL HISTORY: Brittany Marks 83 y.o. female returns to clinic today for follow-up visit.Discussed the use of AI scribe software for clinical note transcription with the patient, who gave verbal consent to proceed.  History of Present Illness   Brittany Marks "[name]" is an 83 year old female with extensive stage small cell lung cancer who presents for cycle number sixty-four of her treatment.  She was initially diagnosed with extensive stage small cell lung cancer in May 2020 and began systemic chemotherapy with carboplatin, Etoposide, and Imfinzi, completing four cycles. She is currently on cycle number sixty-four and has been on maintenance treatment with Imfinzi every four weeks.  Recently, she experienced four falls, leading to a hospital visit where an MRI of the brain and a CT angiogram of the neck were performed. These studies revealed small strokes. During the last fall, she sustained a fracture to her right arm. A cast was initially applied, but due to previous injuries and surgeries on that arm, it was decided to leave it without further intervention. She is  currently residing at [facility], where she is undergoing physical therapy to aid her recovery.  She reports feeling fine today with no fatigue, weakness, nausea, or vomiting. She is on oxygen therapy.       MEDICAL HISTORY: Past Medical History:  Diagnosis Date   Acute CVA (cerebrovascular accident) (HCC) 12/07/2023   Cataract    Bilateral   Constipation    pain medication   Left rotator cuff tear    Right rotator cuff tear    SCL CA dx'd 11/2018    ALLERGIES:  is allergic to oxycodone.  MEDICATIONS:  Current Outpatient Medications  Medication Sig Dispense Refill   acetaminophen (TYLENOL) 325 MG tablet Take 2 tablets (650 mg total) by mouth every 4 (four) hours as needed for mild pain (pain score 1-3) (or temp > 37.5 C (99.5 F)).     albuterol (VENTOLIN HFA) 108 (90 Base) MCG/ACT inhaler Inhale 2 puffs into the lungs every 6 (six) hours as needed for wheezing or shortness of breath. 8 g 2   Ascorbic Acid (VITAMIN C PO) Take 1 tablet by mouth daily.     aspirin EC 81 MG tablet Take 1 tablet (81 mg total) by mouth daily. Swallow whole.     atorvastatin (LIPITOR) 40 MG tablet Take 1 tablet (40 mg total) by mouth daily.     b complex vitamins tablet Take 1 tablet by mouth daily.     COD LIVER OIL PO Take 1 capsule by mouth daily.     gabapentin (NEURONTIN) 100 MG capsule Take 1 capsule (100 mg  total) by mouth 2 (two) times daily. (Patient taking differently: Take 100 mg by mouth See admin instructions. Take 100 mg by mouth in the morning and evening) 90 capsule 2   ibuprofen (ADVIL) 200 MG tablet Take 600 mg by mouth See admin instructions. Take 600 mg by mouth in the morning and evening     lidocaine-prilocaine (EMLA) cream Apply 1 Application topically every 30 (thirty) days. (Patient taking differently: Apply 1 Application topically as directed.) 30 g 2   Multiple Vitamin (MULTIVITAMIN) tablet Take 1 tablet by mouth daily with breakfast.     nicotine (NICODERM CQ - DOSED IN MG/24  HOURS) 14 mg/24hr patch Place 14 mg onto the skin daily as needed (for the urge to smoke while hospitalized).     ondansetron (ZOFRAN) 4 MG tablet Take 1 tablet (4 mg total) by mouth every 8 (eight) hours as needed for nausea or vomiting. 10 tablet 0   oxymetazoline (AFRIN) 0.05 % nasal spray Place 2 sprays into both nostrils 2 (two) times daily as needed (for a bloody nose).     prochlorperazine (COMPAZINE) 10 MG tablet Take 1 tablet (10 mg total) by mouth every 6 (six) hours as needed for nausea or vomiting. 30 tablet 0   senna-docusate (SENOKOT-S) 8.6-50 MG tablet Take 1 tablet by mouth at bedtime as needed for mild constipation.     VITAMIN E PO Take 1 capsule by mouth daily.     No current facility-administered medications for this visit.   Facility-Administered Medications Ordered in Other Visits  Medication Dose Route Frequency Provider Last Rate Last Admin   heparin lock flush 100 unit/mL  500 Units Intracatheter Once PRN Magrinat, Valentino Hue, MD       sodium chloride flush (NS) 0.9 % injection 10 mL  10 mL Intracatheter PRN Magrinat, Valentino Hue, MD        SURGICAL HISTORY:  Past Surgical History:  Procedure Laterality Date   CATARACT EXTRACTION W/ INTRAOCULAR LENS  IMPLANT, BILATERAL  2012   DECOMPRESSIVE LUMBAR LAMINECTOMY LEVEL 2 N/A 07/29/2013   Procedure: LUMBAR LAMINECTOMY, DECOMPRESSION LUMBAR THREE TO FOUR, FOUR TO FIVE microdiscectomy l3,4 right;  Surgeon: Jacki Cones, MD;  Location: [facility];  Service: Orthopedics;  Laterality: N/A;   I & D SUPERIOR RIGHT SHOULDER AND CLOSURE WOUND  01-10-2011   S/P ROTATOR CUFF REPAIR   IR IMAGING GUIDED PORT INSERTION  04/09/2019   LUMBAR LAMINECTOMY  1970'S   LUMBAR LAMINECTOMY/DECOMPRESSION MICRODISCECTOMY N/A 06/27/2016   Procedure: L1 - L2 DISCECTOMY;  Surgeon: Venita Lick, MD;  Location: [facility];  Service: Orthopedics;  Laterality: N/A;   LUMBAR SPINE SURGERY  1983   REVERSE SHOULDER ARTHROPLASTY Left 03/10/2020   Procedure: REVERSE  SHOULDER ARTHROPLASTY;  Surgeon: Francena Hanly, MD;  Location: [facility];  Service: Orthopedics;  Laterality: Left;    RIGHT SHOULDER ARTHROSCOPY/ OPEN DISTAL CLAVICLE RESECTION/ SAD/ OPEN ROTATOR CUFF REPAIR  11-28-2010   SHOULDER FUSION SURGERY  03/11/2020   Shoulder Surgery , hardware placed   SHOULDER OPEN ROTATOR CUFF REPAIR  12/19/2011   Procedure: ROTATOR CUFF REPAIR SHOULDER OPEN;  Surgeon: Drucilla Schmidt, MD;  Location: [facility];  Service: Orthopedics;  Laterality: Right;  RIGHT RECURRENT OPEN REPAIR OF THE ROTATOR CUFF WITH TISSUE MEND GRAFTANTERIOR CHROMIOECTOMY   VAGINAL HYSTERECTOMY  1979    REVIEW OF SYSTEMS:  A comprehensive review of systems was negative except for: Constitutional: positive for fatigue Respiratory: positive for dyspnea on exertion Musculoskeletal: positive for  arthralgias and muscle weakness   PHYSICAL EXAMINATION: General appearance: alert, cooperative, fatigued, and no distress Head: Normocephalic, without obvious abnormality, atraumatic Neck: no adenopathy, no JVD, supple, symmetrical, trachea midline, and thyroid not enlarged, symmetric, no tenderness/mass/nodules Lymph nodes: Cervical, supraclavicular, and axillary nodes normal. Resp: clear to auscultation bilaterally Back: symmetric, no curvature. ROM normal. No CVA tenderness. Cardio: regular rate and rhythm, S1, S2 normal, no murmur, click, rub or gallop GI: soft, non-tender; bowel sounds normal; no masses,  no organomegaly Extremities: extremities normal, atraumatic, no cyanosis or edema   ECOG PERFORMANCE STATUS: 1 - Symptomatic but completely ambulatory  Blood pressure (!) 87/56, pulse 69, temperature 98.1 F (36.7 C), temperature source Temporal, resp. rate 17, weight 144 lb (65.3 kg), SpO2 90%.  LABORATORY DATA: Lab Results  Component Value Date   WBC 10.8 (H) 12/31/2023   HGB 12.2 12/31/2023   HCT 34.2 (L) 12/31/2023   MCV 91.2 12/31/2023   PLT 282 12/31/2023       Chemistry      Component Value Date/Time   NA 135 12/09/2023 0406   K 4.1 12/09/2023 0406   CL 104 12/09/2023 0406   CO2 23 12/09/2023 0406   BUN 21 12/09/2023 0406   CREATININE 0.60 12/09/2023 0406   CREATININE 0.68 12/03/2023 1338      Component Value Date/Time   CALCIUM 8.5 (L) 12/09/2023 0406   ALKPHOS 56 12/09/2023 0406   AST 33 12/09/2023 0406   AST 20 12/03/2023 1338   ALT 20 12/09/2023 0406   ALT 11 12/03/2023 1338   BILITOT 1.1 12/09/2023 0406   BILITOT 0.7 12/03/2023 1338       RADIOGRAPHIC STUDIES: VAS [place] LOWER EXTREMITY VENOUS (DVT) Result Date: 12/08/2023  Lower Venous DVT Study Patient Name:  COLLETTA SPILLERS  Date of Exam:   12/08/2023 Medical Rec #: [id number]          Accession #:    [id number] Date of Birth: 1940/10/30          Patient Gender: F Patient Age:   58 years Exam Location:  [facility] Procedure:      VAS [place] LOWER EXTREMITY VENOUS (DVT) Referring Phys: Scheryl Marten XU --------------------------------------------------------------------------------  Indications: Stroke, and Embolic stroke, lung cancer, recent fall/injury.  Comparison Study: No prior exam. Performing Technologist: Fernande Bras  Examination Guidelines: A complete evaluation includes B-mode imaging, spectral Doppler, color Doppler, and power Doppler as needed of all accessible portions of each vessel. Bilateral testing is considered an integral part of a complete examination. Limited examinations for reoccurring indications may be performed as noted. The reflux portion of the exam is performed with the patient in reverse Trendelenburg.  +---------+---------------+---------+-----------+----------+--------------+ RIGHT    CompressibilityPhasicitySpontaneityPropertiesThrombus Aging +---------+---------------+---------+-----------+----------+--------------+ CFV      Full           Yes      Yes                                  +---------+---------------+---------+-----------+----------+--------------+ SFJ      Full           Yes      Yes                                 +---------+---------------+---------+-----------+----------+--------------+ FV Prox  Full                                                        +---------+---------------+---------+-----------+----------+--------------+  FV Mid   Full                                                        +---------+---------------+---------+-----------+----------+--------------+ FV DistalFull                                                        +---------+---------------+---------+-----------+----------+--------------+ PFV      Full                                                        +---------+---------------+---------+-----------+----------+--------------+ POP      Full           Yes      Yes                                 +---------+---------------+---------+-----------+----------+--------------+ PTV      Full                                                        +---------+---------------+---------+-----------+----------+--------------+ PERO     Full                                                        +---------+---------------+---------+-----------+----------+--------------+   +---------+---------------+---------+-----------+----------+--------------+ LEFT     CompressibilityPhasicitySpontaneityPropertiesThrombus Aging +---------+---------------+---------+-----------+----------+--------------+ CFV      Full           Yes      Yes                                 +---------+---------------+---------+-----------+----------+--------------+ SFJ      Full           Yes      Yes                                 +---------+---------------+---------+-----------+----------+--------------+ FV Prox  Full                                                         +---------+---------------+---------+-----------+----------+--------------+ FV Mid   Full                                                        +---------+---------------+---------+-----------+----------+--------------+  FV DistalFull                                                        +---------+---------------+---------+-----------+----------+--------------+ PFV      Full                                                        +---------+---------------+---------+-----------+----------+--------------+ POP      Full           Yes      Yes                                 +---------+---------------+---------+-----------+----------+--------------+ PTV      Full                                                        +---------+---------------+---------+-----------+----------+--------------+ PERO     Full                                                        +---------+---------------+---------+-----------+----------+--------------+     Summary: BILATERAL: - No evidence of deep vein thrombosis seen in the lower extremities, bilaterally. -No evidence of popliteal cyst, bilaterally.   *See table(s) above for measurements and observations. Electronically signed by Coral Else MD on 12/08/2023 at 9:28:12 PM.    Final    ECHOCARDIOGRAM COMPLETE Result Date: 12/08/2023    ECHOCARDIOGRAM REPORT   Patient Name:   Brittany Marks Date of Exam: 12/08/2023 Medical Rec #:  [id number]         Height:       62.0 in Accession #:    [id number]        Weight:       140.3 lb Date of Birth:  [birth year]         BSA:          1.644 m Patient Age:    82 years          BP:           112/94 mmHg Patient Gender: F                 HR:           80 bpm. Exam Location:  Inpatient Procedure: 2D Echo, Cardiac Doppler and Color Doppler (Both Spectral and Color            Flow Doppler were utilized during procedure). Indications:    Stroke  History:        Patient has no prior history of Echocardiogram  examinations.                 Cancer; Risk Factors:Former Smoker.  Sonographer:    Amy Chionchio Referring Phys: [id number] ALLISON WOLFE IMPRESSIONS  1. Limited  windows. Left ventricular ejection fraction, by estimation, is 40 to 45%. The left ventricle has mildly decreased function. The left ventricle demonstrates global hypokinesis. There is mild left ventricular hypertrophy. Left ventricular diastolic parameters are consistent with Grade II diastolic dysfunction (pseudonormalization).  2. Right ventricular systolic function is mildly reduced. The right ventricular size is normal. There is normal pulmonary artery systolic pressure. The estimated right ventricular systolic pressure is 24.0 mmHg.  3. Left atrial size was severely dilated.  4. Mild mitral valve regurgitation.  5. The aortic valve was not well visualized. Aortic valve regurgitation is not visualized.  6. The inferior vena cava is normal in size with greater than 50% respiratory variability, suggesting right atrial pressure of 3 mmHg. FINDINGS  Left Ventricle: Limited windows. Left ventricular ejection fraction, by estimation, is 40 to 45%. The left ventricle has mildly decreased function. The left ventricle demonstrates global hypokinesis. Strain imaging was not performed. The left ventricular internal cavity size was normal in size. There is mild left ventricular hypertrophy. Left ventricular diastolic parameters are consistent with Grade II diastolic dysfunction (pseudonormalization). Right Ventricle: The right ventricular size is normal. Right ventricular systolic function is mildly reduced. There is normal pulmonary artery systolic pressure. The tricuspid regurgitant velocity is 2.29 m/s, and with an assumed right atrial pressure of  3 mmHg, the estimated right ventricular systolic pressure is 24.0 mmHg. Left Atrium: Left atrial size was severely dilated. Right Atrium: Right atrial size was normal in size. Pericardium: There is no evidence of  pericardial effusion. Mitral Valve: Mild mitral valve regurgitation. MV peak gradient, 10.1 mmHg. The mean mitral valve gradient is 5.0 mmHg. Tricuspid Valve: Tricuspid valve regurgitation is mild. Aortic Valve: The aortic valve was not well visualized. Aortic valve regurgitation is not visualized. Aortic valve mean gradient measures 7.0 mmHg. Aortic valve peak gradient measures 10.2 mmHg. Aortic valve area, by VTI measures 1.15 cm. Pulmonic Valve: Pulmonic valve regurgitation is not visualized. Aorta: The aortic root and ascending aorta are structurally normal, with no evidence of dilitation. Venous: The inferior vena cava is normal in size with greater than 50% respiratory variability, suggesting right atrial pressure of 3 mmHg. IAS/Shunts: No atrial level shunt detected by color flow Doppler. Additional Comments: 3D imaging was not performed.  LEFT VENTRICLE PLAX 2D LVIDd:         4.80 cm     Diastology LVIDs:         4.10 cm     LV e' medial:    5.33 cm/s LV PW:         0.90 cm     LV E/e' medial:  27.4 LV IVS:        1.10 cm     LV e' lateral:   9.68 cm/s LVOT diam:     2.00 cm     LV E/e' lateral: 15.1 LV SV:         40 LV SV Index:   24 LVOT Area:     3.14 cm  LV Volumes (MOD) LV vol d, MOD A2C: 86.1 ml LV vol d, MOD A4C: 88.3 ml LV vol s, MOD A2C: 44.8 ml LV vol s, MOD A4C: 46.4 ml LV SV MOD A2C:     41.3 ml LV SV MOD A4C:     88.3 ml LV SV MOD BP:      44.5 ml RIGHT VENTRICLE            IVC RV Basal diam:  3.50 cm  IVC diam: 1.60 cm RV S prime:     5.87 cm/s TAPSE (M-mode): 0.9 cm LEFT ATRIUM              Index        RIGHT ATRIUM           Index LA Vol (A2C):   85.4 ml  51.94 ml/m  RA Area:     13.60 cm LA Vol (A4C):   101.0 ml 61.42 ml/m  RA Volume:   32.50 ml  19.77 ml/m LA Biplane Vol: 94.7 ml  57.59 ml/m  AORTIC VALVE                     PULMONIC VALVE AV Area (Vmax):    1.19 cm      PV Vmax:       0.63 m/s AV Area (Vmean):   0.99 cm      PV Peak grad:  1.6 mmHg AV Area (VTI):     1.15 cm  AV Vmax:           160.00 cm/s AV Vmean:          125.000 cm/s AV VTI:            0.350 m AV Peak Grad:      10.2 mmHg AV Mean Grad:      7.0 mmHg LVOT Vmax:         60.60 cm/s LVOT Vmean:        39.300 cm/s LVOT VTI:          0.128 m LVOT/AV VTI ratio: 0.37  AORTA Ao Root diam: 2.70 cm Ao Asc diam:  3.00 cm MITRAL VALVE                TRICUSPID VALVE MV Area (PHT): 4.19 cm     TR Peak grad:   21.0 mmHg MV Area VTI:   1.11 cm     TR Vmax:        229.00 cm/s MV Peak grad:  10.1 mmHg MV Mean grad:  5.0 mmHg     SHUNTS MV Vmax:       1.59 m/s     Systemic VTI:  0.13 m MV Vmean:      111.0 cm/s   Systemic Diam: 2.00 cm MV Decel Time: 181 msec MV E velocity: 146.00 cm/s MV A velocity: 115.00 cm/s MV E/A ratio:  1.27 Carolan Clines Electronically signed by Carolan Clines Signature Date/Time: 12/08/2023/2:11:03 PM    Final    CT ANGIO HEAD NECK W WO CM Result Date: 12/07/2023 CLINICAL DATA:  Acute left ACA and left caudate infarcts. Small cell lung cancer. EXAM: CT ANGIOGRAPHY HEAD AND NECK WITH AND WITHOUT CONTRAST TECHNIQUE: Multidetector CT imaging of the head and neck was performed using the standard protocol during bolus administration of intravenous contrast. Multiplanar CT image reconstructions and MIPs were obtained to evaluate the vascular anatomy. Carotid stenosis measurements (when applicable) are obtained utilizing NASCET criteria, using the distal internal carotid diameter as the denominator. RADIATION DOSE REDUCTION: This exam was performed according to the departmental dose-optimization program which includes automated exposure control, adjustment of the mA and/or kV according to patient size and/or use of iterative reconstruction technique. CONTRAST:  75mL OMNIPAQUE IOHEXOL 350 MG/ML SOLN COMPARISON:  MR head without and with contrast 12/07/2023. CT head without and with contrast 06/16/2019 FINDINGS: CT HEAD FINDINGS Brain: The acute ACA territory infarcts are below the resolution of CT. Advanced white matter  disease is again noted. Remote left basal ganglia infarct is present. Deep gray nuclei are otherwise within normal limits. The ventricles are proportionate to the degree of atrophy. No significant extraaxial fluid collection is present. The brainstem and cerebellum are within normal limits. Midline structures are within normal limits. Vascular: Atherosclerotic calcifications are present within the cavernous internal carotid arteries bilaterally and at the dural margin of the left vertebral artery. No hyperdense vessel is present. Skull: Calvarium is intact. No focal lytic or blastic lesions are present. No significant extracranial soft tissue lesion is present. Sinuses/Orbits: The paranasal sinuses and mastoid air cells are clear. Bilateral lens replacements are noted. Globes and orbits are otherwise unremarkable. Review of the MIP images confirms the above findings CTA NECK FINDINGS Aortic arch: Extensive atherosclerotic calcifications are present the aortic arch and great vessel origins. High-grade, near occlusive stenosis is present at the proximal left subclavian artery. No dissection or aneurysm is present. Right carotid system: The right common carotid artery demonstrates some mural calcification without focal stenosis. Dense atherosclerotic calcifications are present at the right carotid bifurcation lumen is narrowed to 1 mm. More distal cervical right ICA is within normal limits. Left carotid system: The left common carotid artery demonstrates extensive mural calcifications without focal stenosis relative to the more distal vessel. Atherosclerotic calcifications are present at the bifurcation and proximal left ICA. Lumen is narrowed to 2 mm. The more distal cervical left ICA is of normal caliber. Mural calcification is present in the vessel just below the skull base without focal stenosis. Vertebral arteries: Right vertebral artery is dominant vessel. High-grade stenosis is present at the origin of the left  vertebral artery. Calcifications are present in the left vertebral artery in the proximal left V2 segment without a tandem stenosis. Skeleton: Multilevel degenerative changes are present in the cervical spine. Uncovertebral spurring contributes to moderate foraminal stenosis bilaterally at C5-6. No focal osseous lesions are present. Other neck: The soft tissues of the neck are otherwise unremarkable. Salivary glands are within normal limits. Thyroid is normal. No significant adenopathy is present. No focal mucosal or submucosal lesions are present. Upper chest: Post radiation changes are again noted in the left upper lobe. Paraseptal emphysematous changes are present bilaterally. Review of the MIP images confirms the above findings CTA HEAD FINDINGS Anterior circulation: Atherosclerotic calcifications are present within the cavernous internal carotid arteries bilaterally without significant stenosis through the ICA termini. The A1 and M1 segments are normal. The left A1 segment is dominant. The anterior communicating artery is patent. MCA bifurcations are normal bilaterally. The ACA and MCA branch vessels are normal bilaterally. Posterior circulation: Atherosclerotic calcifications are present at the dural margin of the left vertebral artery without focal stenosis. The PICA origins are visualized and normal. The vertebrobasilar junction basilar artery is normal. Both posterior cerebral arteries scratched at the superior cerebellar arteries are patent. Both posterior cerebral arteries originate from the basilar tip. Mild narrowing is present in the mid left P2 segment. Venous sinuses: The dural sinuses are patent. The straight sinus and deep cerebral veins are intact. Cortical veins are within normal limits. No significant vascular malformation is evident. Anatomic variants: None Review of the MIP images confirms the above findings IMPRESSION: 1. The acute left ACA territory infarcts are below the resolution of CT.  2. Advanced white matter disease likely reflects the sequela of chronic microvascular ischemia. 3. Remote left basal ganglia infarct. 4. 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 5. High-grade stenosis at the origin  of the left vertebral artery. 6. Severe stenosis at the right carotid bifurcation with luminal narrowing of 1 mm. 7. Mild narrowing in the mid left P2 segment. 8. No other significant proximal stenosis, aneurysm, or branch vessel occlusion within the Circle of Willis. 9. Post radiation changes in the left upper lobe. 10. Multilevel degenerative changes in the cervical spine. 11. Aortic Atherosclerosis (ICD10-I70.0) and Emphysema (ICD10-J43.9). Electronically Signed   By: Marin Roberts M.D.   On: 12/07/2023 17:31   MR BRAIN W WO CONTRAST Result Date: 12/07/2023 CLINICAL DATA:  Metastatic disease evaluation EXAM: MRI HEAD WITHOUT AND WITH CONTRAST TECHNIQUE: Multiplanar, multiecho pulse sequences of the brain and surrounding structures were obtained without and with intravenous contrast. CONTRAST:  6mL GADAVIST GADOBUTROL 1 MMOL/ML IV SOLN COMPARISON:  None Available. FINDINGS: Brain: Small foci of restricted diffusion in the anterior left temporal lobe, left caudate head, and parasagittal left frontal lobe. These span the left MCA and ACA distributions. Extensive chronic small vessel ischemia in the cerebral white matter with confluent FLAIR hyperintensity. 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 Small chronic right frontal microhemorrhage Vascular: Major flow voids and vascular enhancements are preserved. Skull and upper cervical spine: Normal marrow signal. Degeneration with C2-3 facet ankylosis. Sinuses/Orbits: Negative IMPRESSION: Small acute infarcts scattered in the left ACA and MCA distribution. Advanced chronic small vessel disease. Electronically Signed   By: Tiburcio Pea M.D.   On: 12/07/2023 12:38   DG Humerus Right Result  Date: 12/07/2023 CLINICAL DATA:  Status post fall.  Decreased mobility. EXAM: RIGHT HUMERUS - 2+ VIEW COMPARISON:  None Available. FINDINGS: There is an acute, obliquely oriented, fracture deformity involving the proximal shaft of the right humerus. There is lateral and posterior angulation of the distal fracture fragments. Advanced arthro pathic changes involving the right glenohumeral joint. IMPRESSION: Acute, obliquely oriented, fracture deformity involving the proximal shaft of the right humerus. Electronically Signed   By: Signa Kell M.D.   On: 12/07/2023 10:26   DG Shoulder Right Result Date: 12/07/2023 CLINICAL DATA:  Fall.  Pain and decreased mobility. EXAM: RIGHT SHOULDER - 2+ VIEW COMPARISON:  None Available. FINDINGS: There is and acute, obliquely oriented fracture deformity involving the proximal shaft of the right humerus. There is posterior and lateral angulation of the distal fracture fragments. Advanced arthro pathic changes are noted involving the glenohumeral joint. Foreshortening of the right clavicle may be posttraumatic or postsurgical. IMPRESSION: 1. Acute, obliquely oriented fracture deformity involving the proximal shaft of the right humerus. 2. Advanced glenohumeral osteoarthritis. Electronically Signed   By: Signa Kell M.D.   On: 12/07/2023 10:24     ASSESSMENT AND PLAN: This is a very pleasant 83 years old white female with extensive stage small cell lung cancer and she is currently undergoing treatment with carboplatin, etoposide and Imfinzi status post 5 cycles.  Starting from cycle #6 the patient is on maintenance treatment with single agent Imfinzi every 4 weeks status post 63 cycles of total treatment.     Extensive stage small cell lung cancer Diagnosed in May 2020. Currently on maintenance treatment with Imfinzi every four weeks. She has completed 63 cycles and is here for cycle number 64. She has been doing well with the treatment, with no new symptoms such as  fatigue, weakness, nausea, or vomiting reported. The decision to continue with the current treatment plan is based on her positive response to therapy. - Administer cycle number 64 of Imfinzi - Schedule next treatment in four weeks -  Plan for a scan after the next treatment cycle  Oxygen dependency On oxygen therapy, likely due to lung cancer-related respiratory issues. No new symptoms such as fatigue, weakness, nausea, or vomiting reported. - Continue oxygen therapy as needed  Falls with right arm fracture Experienced four falls recently, resulting in a right arm fracture. The fracture was managed conservatively with a cast, which has since been removed. She is currently undergoing rehabilitation at [facility] with physical therapy to regain normal function. - Continue physical therapy at [facility]  Cerebrovascular accident (CVA) Had a recent cerebrovascular accident, confirmed by MRI brain and CT angiogram. No acute treatment was provided as the stroke was minor. She is currently without new neurological symptoms. - Ensure follow-up with primary care or neurology as needed   The patient was advised to call immediately if she has any concerning symptoms in the interval. The patient voices understanding of current disease status and treatment options and is in agreement with the current care plan.  All questions were answered. The patient knows to call the clinic with any problems, questions or concerns. We can certainly see the patient much sooner if necessary. The total time spent in the appointment was 20 minutes.  Disclaimer: This note was dictated with voice recognition software. Similar sounding words can inadvertently be transcribed and may not be corrected upon review.

## 2024-01-08 ENCOUNTER — Other Ambulatory Visit: Payer: Self-pay

## 2024-01-15 NOTE — Progress Notes (Unsigned)
 [facility] [address] [place].  [id number] [phone]       HOSPITAL FOLLOW UP NOTE  Ms. Brittany Marks Date of Birth:  23-Dec-1940 Medical Record Number:  [id number]   Reason for Referral:  hospital stroke follow up    SUBJECTIVE:   CHIEF COMPLAINT:  No chief complaint on file.   HPI:   Ms. Brittany Marks is a 83 y.o. 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  Stroke workup revealed left ACA scattered subacute and acute infarcts, as well as 2 punctate left BG/caudate head infarcts, embolic likely secondary to hypercoagulable state from advanced malignancy.  Noted intracranial and extracranial stenosis (see below), recommended avoidance of low BP.  Placed on aspirin 81 mg daily, patient reluctant to initiate anticoagulation.  Initiated atorvastatin 40 mg daily, LDL 103.  Tobacco cessation counseling provided.  Discharged to SNF.        PERTINENT IMAGING  CT no acute finding, left BG old infarct CT head and neck near occlusion left subclavian artery and brachial cephalic artery, high-grade stenosis left VA origin, bilateral carotid bulb severe stenosis, left CCA midportion and right CCA proximal atherosclerosis. MRI subacute/acute left ACA infarcts, and 2 punctate left BG/caudate head infarcts. 2D Echo EF 40 to 45% LE venous Doppler no DVT LDL 103 HgbA1c 5.9 UDS positive for opiates    ROS:   14 system review of systems performed and negative with exception of ***  PMH:  Past Medical History:  Diagnosis Date   Acute CVA (cerebrovascular accident) (HCC) 12/07/2023   Cataract    Bilateral   Constipation    pain medication   Left rotator cuff tear    Right rotator cuff tear    SCL CA dx'd 11/2018    PSH:  Past Surgical History:  Procedure Laterality Date   CATARACT  EXTRACTION W/ INTRAOCULAR LENS  IMPLANT, BILATERAL  2012   DECOMPRESSIVE LUMBAR LAMINECTOMY LEVEL 2 N/A 07/29/2013   Procedure: LUMBAR LAMINECTOMY, DECOMPRESSION LUMBAR THREE TO FOUR, FOUR TO FIVE microdiscectomy l3,4 right;  Surgeon: Jacki Cones, MD;  Location: [facility];  Service: Orthopedics;  Laterality: N/A;   I & D SUPERIOR RIGHT SHOULDER AND CLOSURE WOUND  01-10-2011   S/P ROTATOR CUFF REPAIR   IR IMAGING GUIDED PORT INSERTION  04/09/2019   LUMBAR LAMINECTOMY  1970'S   LUMBAR LAMINECTOMY/DECOMPRESSION MICRODISCECTOMY N/A 06/27/2016   Procedure: L1 - L2 DISCECTOMY;  Surgeon: Venita Lick, MD;  Location: [facility];  Service: Orthopedics;  Laterality: N/A;   LUMBAR SPINE SURGERY  1983   REVERSE SHOULDER ARTHROPLASTY Left 03/10/2020   Procedure: REVERSE SHOULDER ARTHROPLASTY;  Surgeon: Francena Hanly, MD;  Location: [facility];  Service: Orthopedics;  Laterality: Left;    RIGHT SHOULDER ARTHROSCOPY/ OPEN DISTAL CLAVICLE RESECTION/ SAD/ OPEN ROTATOR CUFF REPAIR  11-28-2010   SHOULDER FUSION SURGERY  03/11/2020   Shoulder Surgery , hardware placed   SHOULDER OPEN ROTATOR CUFF REPAIR  12/19/2011   Procedure: ROTATOR CUFF REPAIR SHOULDER OPEN;  Surgeon: Drucilla Schmidt, MD;  Location: [facility];  Service: Orthopedics;  Laterality: Right;  RIGHT RECURRENT OPEN REPAIR OF THE ROTATOR CUFF WITH TISSUE MEND GRAFTANTERIOR CHROMIOECTOMY   VAGINAL HYSTERECTOMY  1979    Social History:  Social History   Socioeconomic History   Marital status: Divorced    Spouse  name: Not on file   Number of children: 1   Years of education: Not on file   Highest education level: Not on file  Occupational History   Not on file  Tobacco Use   Smoking status: Every Day    Current packs/day: 0.50    Average packs/day: 0.5 packs/day for 50.0 years (25.0 ttl pk-yrs)    Types: Cigarettes   Smokeless tobacco: Never   Tobacco comments:    trying to quit now, using the patch  Vaping Use   Vaping  status: Never Used  Substance and Sexual Activity   Alcohol use: Yes    Comment: RARE   Drug use: No   Sexual activity: Not on file  Other Topics Concern   Not on file  Social History Narrative   Not on file   Social Drivers of Health   Financial Resource Strain: Not on file  Food Insecurity: No Food Insecurity (12/07/2023)   Hunger Vital Sign    Worried About Running Out of Food in the Last Year: Never true    Ran Out of Food in the Last Year: Never true  Transportation Needs: No Transportation Needs (12/07/2023)   PRAPARE - Administrator, Civil Service (Medical): No    Lack of Transportation (Non-Medical): No  Physical Activity: Not on file  Stress: Not on file  Social Connections: Unknown (12/07/2023)   Social Connection and Isolation Panel [NHANES]    Frequency of Communication with Friends and Family: Twice a week    Frequency of Social Gatherings with Friends and Family: Once a week    Attends Religious Services: Patient declined    Database administrator or Organizations: Patient declined    Attends Banker Meetings: 1 to 4 times per year    Marital Status: Patient declined  Intimate Partner Violence: Not At Risk (12/07/2023)   Humiliation, Afraid, Rape, and Kick questionnaire    Fear of Current or Ex-Partner: No    Emotionally Abused: No    Physically Abused: No    Sexually Abused: No    Family History:  Family History  Problem Relation Age of Onset   Congestive Heart Failure Mother    Congestive Heart Failure Father     Medications:   Current Outpatient Medications on File Prior to Visit  Medication Sig Dispense Refill   acetaminophen (TYLENOL) 325 MG tablet Take 2 tablets (650 mg total) by mouth every 4 (four) hours as needed for mild pain (pain score 1-3) (or temp > 37.5 C (99.5 F)).     albuterol (VENTOLIN HFA) 108 (90 Base) MCG/ACT inhaler Inhale 2 puffs into the lungs every 6 (six) hours as needed for wheezing or shortness of breath. 8  g 2   Ascorbic Acid (VITAMIN C PO) Take 1 tablet by mouth daily.     aspirin EC 81 MG tablet Take 1 tablet (81 mg total) by mouth daily. Swallow whole.     atorvastatin (LIPITOR) 40 MG tablet Take 1 tablet (40 mg total) by mouth daily.     b complex vitamins tablet Take 1 tablet by mouth daily.     COD LIVER OIL PO Take 1 capsule by mouth daily.     gabapentin (NEURONTIN) 100 MG capsule Take 1 capsule (100 mg total) by mouth 2 (two) times daily. (Patient taking differently: Take 100 mg by mouth See admin instructions. Take 100 mg by mouth in the morning and evening) 90 capsule 2   ibuprofen (ADVIL)  200 MG tablet Take 600 mg by mouth See admin instructions. Take 600 mg by mouth in the morning and evening     lidocaine-prilocaine (EMLA) cream Apply 1 Application topically every 30 (thirty) days. (Patient taking differently: Apply 1 Application topically as directed.) 30 g 2   Multiple Vitamin (MULTIVITAMIN) tablet Take 1 tablet by mouth daily with breakfast.     nicotine (NICODERM CQ - DOSED IN MG/24 HOURS) 14 mg/24hr patch Place 14 mg onto the skin daily as needed (for the urge to smoke while hospitalized).     ondansetron (ZOFRAN) 4 MG tablet Take 1 tablet (4 mg total) by mouth every 8 (eight) hours as needed for nausea or vomiting. 10 tablet 0   oxymetazoline (AFRIN) 0.05 % nasal spray Place 2 sprays into both nostrils 2 (two) times daily as needed (for a bloody nose).     prochlorperazine (COMPAZINE) 10 MG tablet Take 1 tablet (10 mg total) by mouth every 6 (six) hours as needed for nausea or vomiting. 30 tablet 0   senna-docusate (SENOKOT-S) 8.6-50 MG tablet Take 1 tablet by mouth at bedtime as needed for mild constipation.     VITAMIN E PO Take 1 capsule by mouth daily.     Current Facility-Administered Medications on File Prior to Visit  Medication Dose Route Frequency Provider Last Rate Last Admin   heparin lock flush 100 unit/mL  500 Units Intracatheter Once PRN Magrinat, Valentino Hue, MD        sodium chloride flush (NS) 0.9 % injection 10 mL  10 mL Intracatheter PRN Magrinat, Valentino Hue, MD        Allergies:   Allergies  Allergen Reactions   Oxycodone Other (See Comments)    Confusion/ near fall      OBJECTIVE:  Physical Exam  There were no vitals filed for this visit. There is no height or weight on file to calculate BMI. No results found.   General: well developed, well nourished, seated, in no evident distress Head: head normocephalic and atraumatic.   Neck: supple with no carotid or supraclavicular bruits Cardiovascular: regular rate and rhythm, no murmurs Musculoskeletal: no deformity Skin:  no rash/petichiae Vascular:  Normal pulses all extremities   Neurologic Exam Mental Status: Awake and fully alert. Oriented to place and time. Recent and remote memory intact. Attention span, concentration and fund of knowledge appropriate. Mood and affect appropriate.  Cranial Nerves: Fundoscopic exam reveals sharp disc margins. Pupils equal, briskly reactive to light. Extraocular movements full without nystagmus. Visual fields full to confrontation. Hearing intact. Facial sensation intact. Face, tongue, palate moves normally and symmetrically.  Motor: Normal bulk and tone. Normal strength in all tested extremity muscles Sensory.: intact to touch , pinprick , position and vibratory sensation.  Coordination: Rapid alternating movements normal in all extremities. Finger-to-nose and heel-to-shin performed accurately bilaterally. Gait and Station: Arises from chair without difficulty. Stance is normal. Gait demonstrates normal stride length and balance with ***. Tandem walk and heel toe ***.  Reflexes: 1+ and symmetric. Toes downgoing.     NIHSS  *** Modified Rankin  ***      ASSESSMENT: Brittany Marks is a 83 y.o. 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 Vascular risk factors include intracranial extracranial stenosis, HLD, tobacco use and advanced age.  PLAN:  Embolic stroke:  Residual deficit: ***.  Continue aspirin 81mg  daily and atorvastatin (Lipitor) for secondary stroke prevention managed/prescribed by PCP.  Declined interest in anticoagulation Discussed secondary stroke prevention measures and importance of close PCP follow up for aggressive stroke risk factor management including BP goal<130/90, HLD with LDL goal<70 and DM with A1c.<7 .  Stroke labs 12/2023: LDL 103, A1c 5.9 I have gone over the pathophysiology of stroke, warning signs and symptoms, risk factors and their management in some detail with instructions to go to the closest emergency room for symptoms of concern.     Follow up in *** or call earlier if needed   CC:  [facility] provider: Dr. Pearlean Brownie PCP: Pcp, No    I spent *** minutes of face-to-face and non-face-to-face time with patient.  This included previsit chart review including review of recent hospitalization, lab review, study review, order entry, electronic health record documentation, patient education regarding recent stroke including etiology, secondary stroke prevention measures and importance of managing stroke risk factors, residual deficits and typical recovery time and answered all other questions to patient satisfaction   Ihor Austin, AGNP-BC  [facility] [address] [place], [place] [address]  Phone [phone] Fax [phone] Note: This document was prepared with digital dictation and possible smart phrase technology. Any transcriptional errors that result from this process are unintentional.

## 2024-01-16 ENCOUNTER — Encounter: Payer: Self-pay | Admitting: Adult Health

## 2024-01-16 ENCOUNTER — Ambulatory Visit (INDEPENDENT_AMBULATORY_CARE_PROVIDER_SITE_OTHER): Admitting: Adult Health

## 2024-01-16 VITALS — BP 98/65 | HR 76 | Ht 62.0 in

## 2024-01-16 DIAGNOSIS — I63422 Cerebral infarction due to embolism of left anterior cerebral artery: Secondary | ICD-10-CM | POA: Diagnosis not present

## 2024-01-16 DIAGNOSIS — D6859 Other primary thrombophilia: Secondary | ICD-10-CM

## 2024-01-16 NOTE — Patient Instructions (Addendum)
 Continue aspirin 81 mg daily  and atorvastatin for secondary stroke prevention  Continue to follow up with PCP regarding cholesterol management  Maintain strict control of cholesterol with LDL cholesterol (bad cholesterol) goal below 70 mg/dL.  Please avoid low blood pressure to ensure adequate perfusion with known intracranial extracranial stenosis  Signs of a Stroke? Follow the BEFAST method:  Balance Watch for a sudden loss of balance, trouble with coordination or vertigo Eyes Is there a sudden loss of vision in one or both eyes? Or double vision?  Face: Ask the person to smile. Does one side of the face droop or is it numb?  Arms: Ask the person to raise both arms. Does one arm drift downward? Is there weakness or numbness of a leg? Speech: Ask the person to repeat a simple phrase. Does the speech sound slurred/strange? Is the person confused ? Time: If you observe any of these signs, call 911.    Stable from stroke standpoint and can follow up on an as needed basis.     Thank you for coming to see [place] at [facility]. I hope we have been able to provide you high quality care today.  You may receive a patient satisfaction survey over the next few weeks. We would appreciate your feedback and comments so that we may continue to improve ourselves and the health of our patients.

## 2024-01-17 NOTE — Progress Notes (Signed)
 I agree with the above plan

## 2024-01-23 NOTE — Progress Notes (Signed)
 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    PRIOR THERAPY: 1) Palliative radiotherapy to the left proximal humerus under the care of Dr. Jeryl Moris. 2) Additional palliative radiotherapy to the proximal humerus under the care of Dr. Jeryl Moris. Last treatment on 04/21/2020  CURRENT THERAPY:  Palliative systemic chemotherapy with carboplatin  for AUC of 5 on day 1, etoposide  100 mg/M2 on days 1, 2 and 3 as well as Imfinzi  1500 mg every 3 weeks with the chemotherapy and Neulasta  support. First dose on 03/16/2019. Status post 64 cycles. Starting from cycle #5 the patient will be treated with maintenance treatment with Imfinzi  1500 mg IV every 4 weeks   INTERVAL HISTORY: Brittany Marks 83 y.o. female returns to the clinic today for a follow-up visit unaccompanied. The patient saw Dr. Marguerita Shih 4 weeks ago. In the interval since last being seen, she saw her neurologist due to her recent stroke in March 2025. She currently is in rehab at [facility].   She still continues consolidation immunotherapy. She is status post 64 cycles of treatment.   She reports chronic fatigue. She denies fever, chills, night sweats, or unexplained weight loss. She is on supplemental oxygen  with 2 L. She has dyspnea which she states is unchanged. She denies hemoptysis and chest pain. She denies cough. She denies any nausea or vomiting. Denies any rashes or skin changes. Denies any headache or visual changes. She denies diarrhea or constipation.  She is here for evaluation and repeat blood work before undergoing cycle #65.    MEDICAL HISTORY: Past Medical History:  Diagnosis Date   Acute CVA  (cerebrovascular accident) (HCC) 12/07/2023   Cataract    Bilateral   Constipation    pain medication   Left rotator cuff tear    Right rotator cuff tear    SCL CA dx'd 11/2018    ALLERGIES:  is allergic to oxycodone .  MEDICATIONS:  Current Outpatient Medications  Medication Sig Dispense Refill   acetaminophen  (TYLENOL ) 325 MG tablet Take 2 tablets (650 mg total) by mouth every 4 (four) hours as needed for mild pain (pain score 1-3) (or temp > 37.5 C (99.5 F)).     albuterol  (VENTOLIN  HFA) 108 (90 Base) MCG/ACT inhaler Inhale 2 puffs into the lungs every 6 (six) hours as needed for wheezing or shortness of breath. 8 g 2   Ascorbic Acid (VITAMIN C PO) Take 1 tablet by mouth daily.     aspirin  EC 81 MG tablet Take 1 tablet (81 mg total) by mouth daily. Swallow whole.     atorvastatin  (LIPITOR) 40 MG tablet Take 1 tablet (40 mg total) by mouth daily.     b complex vitamins tablet Take 1 tablet by mouth daily.     gabapentin  (NEURONTIN ) 100 MG capsule Take 1 capsule (100 mg total) by mouth 2 (two) times daily. (Patient taking differently: Take 100 mg by mouth See admin instructions. Take 100 mg by mouth in the morning and evening) 90 capsule 2   ibuprofen (ADVIL) 200 MG tablet Take 600 mg by mouth See admin instructions. Take 600 mg by mouth in the morning and evening     Multiple Vitamin (MULTIVITAMIN) tablet Take 1  tablet by mouth daily with breakfast.     nicotine  (NICODERM CQ  - DOSED IN MG/24 HOURS) 14 mg/24hr patch Place 14 mg onto the skin daily as needed (for the urge to smoke while hospitalized).     ondansetron  (ZOFRAN ) 4 MG tablet Take 1 tablet (4 mg total) by mouth every 8 (eight) hours as needed for nausea or vomiting. 10 tablet 0   oxymetazoline (AFRIN) 0.05 % nasal spray Place 2 sprays into both nostrils 2 (two) times daily as needed (for a bloody nose).     senna-docusate (SENOKOT-S) 8.6-50 MG tablet Take 1 tablet by mouth at bedtime as needed for mild constipation.     No  current facility-administered medications for this visit.   Facility-Administered Medications Ordered in Other Visits  Medication Dose Route Frequency Provider Last Rate Last Admin   heparin  lock flush 100 unit/mL  500 Units Intracatheter Once PRN Magrinat, Rozella Cornfield, MD       sodium chloride  flush (NS) 0.9 % injection 10 mL  10 mL Intracatheter PRN Magrinat, Rozella Cornfield, MD        SURGICAL HISTORY:  Past Surgical History:  Procedure Laterality Date   CATARACT EXTRACTION W/ INTRAOCULAR LENS  IMPLANT, BILATERAL  2012   DECOMPRESSIVE LUMBAR LAMINECTOMY LEVEL 2 N/A 07/29/2013   Procedure: LUMBAR LAMINECTOMY, DECOMPRESSION LUMBAR THREE TO FOUR, FOUR TO FIVE microdiscectomy l3,4 right;  Surgeon: Florencia Hunter, MD;  Location: [facility];  Service: Orthopedics;  Laterality: N/A;   I & D SUPERIOR RIGHT SHOULDER AND CLOSURE WOUND  01-10-2011   S/P ROTATOR CUFF REPAIR   IR IMAGING GUIDED PORT INSERTION  04/09/2019   LUMBAR LAMINECTOMY  1970'S   LUMBAR LAMINECTOMY/DECOMPRESSION MICRODISCECTOMY N/A 06/27/2016   Procedure: L1 - L2 DISCECTOMY;  Surgeon: Mort Ards, MD;  Location: [facility];  Service: Orthopedics;  Laterality: N/A;   LUMBAR SPINE SURGERY  1983   REVERSE SHOULDER ARTHROPLASTY Left 03/10/2020   Procedure: REVERSE SHOULDER ARTHROPLASTY;  Surgeon: Ellard Gunning, MD;  Location: [facility];  Service: Orthopedics;  Laterality: Left;    RIGHT SHOULDER ARTHROSCOPY/ OPEN DISTAL CLAVICLE RESECTION/ SAD/ OPEN ROTATOR CUFF REPAIR  11-28-2010   SHOULDER FUSION SURGERY  03/11/2020   Shoulder Surgery , hardware placed   SHOULDER OPEN ROTATOR CUFF REPAIR  12/19/2011   Procedure: ROTATOR CUFF REPAIR SHOULDER OPEN;  Surgeon: Verlinda Gloss, MD;  Location: [facility];  Service: Orthopedics;  Laterality: Right;  RIGHT RECURRENT OPEN REPAIR OF THE ROTATOR CUFF WITH TISSUE MEND GRAFTANTERIOR CHROMIOECTOMY   VAGINAL HYSTERECTOMY  1979    REVIEW OF SYSTEMS:   Review of Systems  Constitutional:  Positive for fatigue. Negative for appetite change, chills, fever and unexpected weight change.  HENT: Negative for mouth sores, nosebleeds, sore throat and trouble swallowing.  Eyes: Negative for eye problems and icterus.  Respiratory: Positive for stable intermittent dyspnea on exertion.  Negative for hemoptysis, and wheezing.   Cardiovascular: Negative for chest pain and leg swelling.  Gastrointestinal: Negative for abdominal pain, diarrhea, constipation, diarrhea, nausea and vomiting. Genitourinary: Negative for bladder incontinence, difficulty urinating, dysuria, frequency and hematuria.   Musculoskeletal: Positive for chronic back pain. Negative for gait problem, neck pain and neck stiffness.  Skin: Negative for itching and rash.  Neurological: Negative for dizziness, extremity weakness, gait problem, headaches, light-headedness and seizures.  Hematological: Negative for adenopathy. Does not bruise/bleed easily.  Psychiatric/Behavioral: Positive for trouble sleeping the day before infusions. Negative for confusion, depression and sleep disturbance. The patient is not  nervous/anxious      PHYSICAL EXAMINATION:  There were no vitals taken for this visit.  ECOG PERFORMANCE STATUS: 3  Physical Exam  Constitutional: Oriented to person, place, and time and chronically ill appearing female and in no distress.  HENT:  Head: Normocephalic and atraumatic.  Mouth/Throat: Oropharynx is clear and moist. No oropharyngeal exudate.  Eyes: Conjunctivae are normal. Right eye exhibits no discharge. Left eye exhibits no discharge. No scleral icterus.  Neck: Normal range of motion. Neck supple.  Cardiovascular: Normal rate, regular rhythm, normal heart sounds.   Pulmonary/Chest: Effort normal. Crackles noted at lung bases bilaterally (baseline). No respiratory distress. On supplemental oxygen .  Abdominal: Soft. Exhibits no distension and no mass. There is no tenderness.  Musculoskeletal: Normal range  of motion. Exhibits no edema.  Lymphadenopathy:    No cervical adenopathy.  Neurological: Alert and oriented to person, place, and time. Exhibits muscle wasting. Examined in the wheelchair.  Skin: Skin is warm and dry. No rash noted. Not diaphoretic. No erythema. No pallor.  Psychiatric: Mood, memory and judgment normal.  Vitals reviewed.  LABORATORY DATA: Lab Results  Component Value Date   WBC 10.8 (H) 12/31/2023   HGB 12.2 12/31/2023   HCT 34.2 (L) 12/31/2023   MCV 91.2 12/31/2023   PLT 282 12/31/2023      Chemistry      Component Value Date/Time   NA 128 (L) 12/31/2023 1311   K 3.5 12/31/2023 1311   CL 94 (L) 12/31/2023 1311   CO2 28 12/31/2023 1311   BUN 23 12/31/2023 1311   CREATININE 0.66 12/31/2023 1311      Component Value Date/Time   CALCIUM  9.4 12/31/2023 1311   ALKPHOS 78 12/31/2023 1311   AST 22 12/31/2023 1311   ALT 13 12/31/2023 1311   BILITOT 0.6 12/31/2023 1311       RADIOGRAPHIC STUDIES:  No results found.   ASSESSMENT/PLAN:  This is a very pleasant 83 year old Caucasian female diagnosed with extensive stage (T1b, N3, M1 C) small cell lung cancer.  She presented with right upper lobe pulmonary nodules in addition to bilateral hilar and right mediastinal as well as left supraclavicular and abdominal lymphadenopathy.  She also has metastatic disease to the in the left humerus.  She was diagnosed in May 2020.   She is status post palliative radiotherapy to the left proximal humerus under the care of Dr. Jeryl Moris in 2020.   She is currently undergoing palliative systemic chemotherapy with carboplatin  for AUC of 5 on day 1, etoposide  100 mg/M2 on days 1, 2 and 3 as well as Imfinzi  1500 mg every 3 weeks with the chemotherapy and Neulasta  support. Status post 64 cycles. Starting from cycle #5 the patient has been treated with maintenance treatment with Imfinzi  1500 mg IV every 4 weeks.   She had a pathological fracture in the left humerus underwent additional  radiation to this area. Last treatment was 04/21/20  Labs were reviewed. Recommend she proceed with cycle #65 today as scheduled.   We will see her back for labs were reviewed. Recommend she proceed with cycle #65 today as scheduled.   We will see her back in 4 weeks for evaluation and repeat blood work before undergoing cycle #66  I will arrange for a restaging CT of the CAP prior to her next cycle of treatment  The patient was advised to call immediately if she has any concerning symptoms in the interval. The patient voices understanding of current disease status and treatment options  and is in agreement with the current care plan. All questions were answered. The patient knows to call the clinic with any problems, questions or concerns. We can certainly see the patient much sooner if necessary      No orders of the defined types were placed in this encounter.   The total time spent in the appointment was 20-29 minutes  Nealy Karapetian L Enaya Howze, PA-C 01/23/24

## 2024-01-29 ENCOUNTER — Inpatient Hospital Stay

## 2024-01-29 ENCOUNTER — Inpatient Hospital Stay: Attending: Internal Medicine | Admitting: Physician Assistant

## 2024-01-29 VITALS — BP 112/81 | HR 70 | Temp 98.2°F | Resp 14 | Wt 132.7 lb

## 2024-01-29 DIAGNOSIS — Z79899 Other long term (current) drug therapy: Secondary | ICD-10-CM | POA: Insufficient documentation

## 2024-01-29 DIAGNOSIS — Z8673 Personal history of transient ischemic attack (TIA), and cerebral infarction without residual deficits: Secondary | ICD-10-CM | POA: Insufficient documentation

## 2024-01-29 DIAGNOSIS — Z5112 Encounter for antineoplastic immunotherapy: Secondary | ICD-10-CM | POA: Diagnosis not present

## 2024-01-29 DIAGNOSIS — C3411 Malignant neoplasm of upper lobe, right bronchus or lung: Secondary | ICD-10-CM | POA: Insufficient documentation

## 2024-01-29 DIAGNOSIS — R0600 Dyspnea, unspecified: Secondary | ICD-10-CM | POA: Insufficient documentation

## 2024-01-29 DIAGNOSIS — C7951 Secondary malignant neoplasm of bone: Secondary | ICD-10-CM | POA: Insufficient documentation

## 2024-01-29 DIAGNOSIS — Z923 Personal history of irradiation: Secondary | ICD-10-CM | POA: Insufficient documentation

## 2024-01-29 DIAGNOSIS — Z9221 Personal history of antineoplastic chemotherapy: Secondary | ICD-10-CM | POA: Diagnosis not present

## 2024-01-29 DIAGNOSIS — Z7982 Long term (current) use of aspirin: Secondary | ICD-10-CM | POA: Diagnosis not present

## 2024-01-29 DIAGNOSIS — Z95828 Presence of other vascular implants and grafts: Secondary | ICD-10-CM

## 2024-01-29 DIAGNOSIS — K5900 Constipation, unspecified: Secondary | ICD-10-CM | POA: Diagnosis not present

## 2024-01-29 LAB — CBC WITH DIFFERENTIAL (CANCER CENTER ONLY)
Abs Immature Granulocytes: 0.05 10*3/uL (ref 0.00–0.07)
Basophils Absolute: 0.1 10*3/uL (ref 0.0–0.1)
Basophils Relative: 1 %
Eosinophils Absolute: 0.4 10*3/uL (ref 0.0–0.5)
Eosinophils Relative: 3 %
HCT: 31.8 % — ABNORMAL LOW (ref 36.0–46.0)
Hemoglobin: 11.1 g/dL — ABNORMAL LOW (ref 12.0–15.0)
Immature Granulocytes: 0 %
Lymphocytes Relative: 8 %
Lymphs Abs: 1 10*3/uL (ref 0.7–4.0)
MCH: 32.5 pg (ref 26.0–34.0)
MCHC: 34.9 g/dL (ref 30.0–36.0)
MCV: 93 fL (ref 80.0–100.0)
Monocytes Absolute: 0.7 10*3/uL (ref 0.1–1.0)
Monocytes Relative: 7 %
Neutro Abs: 9.1 10*3/uL — ABNORMAL HIGH (ref 1.7–7.7)
Neutrophils Relative %: 81 %
Platelet Count: 269 10*3/uL (ref 150–400)
RBC: 3.42 MIL/uL — ABNORMAL LOW (ref 3.87–5.11)
RDW: 12.2 % (ref 11.5–15.5)
WBC Count: 11.3 10*3/uL — ABNORMAL HIGH (ref 4.0–10.5)
nRBC: 0 % (ref 0.0–0.2)

## 2024-01-29 LAB — CMP (CANCER CENTER ONLY)
ALT: 21 U/L (ref 0–44)
AST: 25 U/L (ref 15–41)
Albumin: 3.8 g/dL (ref 3.5–5.0)
Alkaline Phosphatase: 74 U/L (ref 38–126)
Anion gap: 5 (ref 5–15)
BUN: 16 mg/dL (ref 8–23)
CO2: 28 mmol/L (ref 22–32)
Calcium: 9.5 mg/dL (ref 8.9–10.3)
Chloride: 99 mmol/L (ref 98–111)
Creatinine: 0.6 mg/dL (ref 0.44–1.00)
GFR, Estimated: >60 mL/min (ref >60)
Glucose, Bld: 101 mg/dL — ABNORMAL HIGH (ref 70–99)
Potassium: 4.2 mmol/L (ref 3.5–5.1)
Sodium: 132 mmol/L — ABNORMAL LOW (ref 135–145)
Total Bilirubin: 0.6 mg/dL (ref 0.0–1.2)
Total Protein: 7 g/dL (ref 6.5–8.1)

## 2024-01-29 MED ORDER — SODIUM CHLORIDE 0.9 % IV SOLN
1500.0000 mg | Freq: Once | INTRAVENOUS | Status: AC
  Administered 2024-01-29: 1500 mg via INTRAVENOUS
  Filled 2024-01-29: qty 30

## 2024-01-29 MED ORDER — SODIUM CHLORIDE 0.9 % IV SOLN
Freq: Once | INTRAVENOUS | Status: AC
Start: 2024-01-29 — End: 2024-01-29

## 2024-01-29 MED ORDER — SODIUM CHLORIDE 0.9% FLUSH
10.0000 mL | INTRAVENOUS | Status: DC | PRN
  Administered 2024-01-29: 10 mL via INTRAVENOUS

## 2024-01-29 NOTE — Patient Instructions (Signed)
 [facility] - A DEPT OF [facility]  Discharge Instructions: Thank you for choosing [facility] to provide your oncology and hematology care.   If you have a lab appointment with the Cancer Center, please go directly to the Cancer Center and check in at the registration area.   Wear comfortable clothing and clothing appropriate for easy access to any Portacath or PICC line.   We strive to give you quality time with your provider. You may need to reschedule your appointment if you arrive late (15 or more minutes).  Arriving late affects you and other patients whose appointments are after yours.  Also, if you miss three or more appointments without notifying the office, you may be dismissed from the clinic at the provider's discretion.      For prescription refill requests, have your pharmacy contact our office and allow 72 hours for refills to be completed.    Today you received the following chemotherapy and/or immunotherapy agents: imfinzi   To help prevent nausea and vomiting after your treatment, we encourage you to take your nausea medication as directed.  BELOW ARE SYMPTOMS THAT SHOULD BE REPORTED IMMEDIATELY: *FEVER GREATER THAN 100.4 F (38 C) OR HIGHER *CHILLS OR SWEATING *NAUSEA AND VOMITING THAT IS NOT CONTROLLED WITH YOUR NAUSEA MEDICATION *UNUSUAL SHORTNESS OF BREATH *UNUSUAL BRUISING OR BLEEDING *URINARY PROBLEMS (pain or burning when urinating, or frequent urination) *BOWEL PROBLEMS (unusual diarrhea, constipation, pain near the anus) TENDERNESS IN MOUTH AND THROAT WITH OR WITHOUT PRESENCE OF ULCERS (sore throat, sores in mouth, or a toothache) UNUSUAL RASH, SWELLING OR PAIN  UNUSUAL VAGINAL DISCHARGE OR ITCHING   Items with * indicate a potential emergency and should be followed up as soon as possible or go to the Emergency Department if any problems should occur.  Please show the CHEMOTHERAPY ALERT CARD or IMMUNOTHERAPY ALERT  CARD at check-in to the Emergency Department and triage nurse.  Should you have questions after your visit or need to cancel or reschedule your appointment, please contact [facility] - A DEPT OF [facility]  Dept: [phone]  and follow the prompts.  Office hours are 8:00 a.m. to 4:30 p.m. Monday - Friday. Please note that voicemails left after 4:00 p.m. may not be returned until the following business day.  We are closed weekends and major holidays. You have access to a nurse at all times for urgent questions. Please call the main number to the clinic Dept: [phone] and follow the prompts.   For any non-urgent questions, you may also contact your provider using MyChart. We now offer e-Visits for anyone 65 and older to request care online for non-urgent symptoms. For details visit [email or web address].   Also download the MyChart app! Go to the app store, search "MyChart", open the app, select [facility], and log in with your MyChart username and password.

## 2024-01-30 ENCOUNTER — Telehealth: Payer: Self-pay | Admitting: Medical Oncology

## 2024-01-30 ENCOUNTER — Other Ambulatory Visit: Payer: Self-pay

## 2024-01-30 NOTE — Telephone Encounter (Signed)
 Dtr called asking if she missed a call from cancer center. I told I don't see any outgoing call today.

## 2024-02-19 ENCOUNTER — Telehealth: Payer: Self-pay | Admitting: Medical Oncology

## 2024-02-19 ENCOUNTER — Ambulatory Visit (HOSPITAL_COMMUNITY)

## 2024-02-19 NOTE — Telephone Encounter (Signed)
 Camilo Cella said pt refused to go get her CT scan today . Camilo Cella will call back after she talks to pt about r/s.

## 2024-02-19 NOTE — Telephone Encounter (Signed)
 Pat told me that she does not want to do a CT scan "right now". She will discuss with Dr. Marguerita Shih next wed.

## 2024-02-26 ENCOUNTER — Inpatient Hospital Stay

## 2024-02-26 ENCOUNTER — Inpatient Hospital Stay: Attending: Internal Medicine

## 2024-02-26 ENCOUNTER — Inpatient Hospital Stay (HOSPITAL_BASED_OUTPATIENT_CLINIC_OR_DEPARTMENT_OTHER): Attending: Internal Medicine | Admitting: Internal Medicine

## 2024-02-26 VITALS — BP 173/63 | HR 68 | Temp 98.1°F | Resp 17 | Ht 62.0 in

## 2024-02-26 DIAGNOSIS — Z7982 Long term (current) use of aspirin: Secondary | ICD-10-CM | POA: Diagnosis not present

## 2024-02-26 DIAGNOSIS — Z9221 Personal history of antineoplastic chemotherapy: Secondary | ICD-10-CM | POA: Insufficient documentation

## 2024-02-26 DIAGNOSIS — Z5112 Encounter for antineoplastic immunotherapy: Secondary | ICD-10-CM | POA: Diagnosis not present

## 2024-02-26 DIAGNOSIS — Z9981 Dependence on supplemental oxygen: Secondary | ICD-10-CM | POA: Diagnosis not present

## 2024-02-26 DIAGNOSIS — Z79899 Other long term (current) drug therapy: Secondary | ICD-10-CM | POA: Diagnosis not present

## 2024-02-26 DIAGNOSIS — Z8673 Personal history of transient ischemic attack (TIA), and cerebral infarction without residual deficits: Secondary | ICD-10-CM | POA: Insufficient documentation

## 2024-02-26 DIAGNOSIS — C778 Secondary and unspecified malignant neoplasm of lymph nodes of multiple regions: Secondary | ICD-10-CM | POA: Insufficient documentation

## 2024-02-26 DIAGNOSIS — C7951 Secondary malignant neoplasm of bone: Secondary | ICD-10-CM | POA: Insufficient documentation

## 2024-02-26 DIAGNOSIS — K5900 Constipation, unspecified: Secondary | ICD-10-CM | POA: Diagnosis not present

## 2024-02-26 DIAGNOSIS — Z95828 Presence of other vascular implants and grafts: Secondary | ICD-10-CM

## 2024-02-26 DIAGNOSIS — S42301D Unspecified fracture of shaft of humerus, right arm, subsequent encounter for fracture with routine healing: Secondary | ICD-10-CM | POA: Diagnosis not present

## 2024-02-26 DIAGNOSIS — C3411 Malignant neoplasm of upper lobe, right bronchus or lung: Secondary | ICD-10-CM | POA: Diagnosis present

## 2024-02-26 DIAGNOSIS — C3490 Malignant neoplasm of unspecified part of unspecified bronchus or lung: Secondary | ICD-10-CM | POA: Diagnosis not present

## 2024-02-26 LAB — CBC WITH DIFFERENTIAL (CANCER CENTER ONLY)
Abs Immature Granulocytes: 0.04 10*3/uL (ref 0.00–0.07)
Basophils Absolute: 0 10*3/uL (ref 0.0–0.1)
Basophils Relative: 0 %
Eosinophils Absolute: 0.4 10*3/uL (ref 0.0–0.5)
Eosinophils Relative: 3 %
HCT: 29.1 % — ABNORMAL LOW (ref 36.0–46.0)
Hemoglobin: 10.2 g/dL — ABNORMAL LOW (ref 12.0–15.0)
Immature Granulocytes: 0 %
Lymphocytes Relative: 11 %
Lymphs Abs: 1.2 10*3/uL (ref 0.7–4.0)
MCH: 32.4 pg (ref 26.0–34.0)
MCHC: 35.1 g/dL (ref 30.0–36.0)
MCV: 92.4 fL (ref 80.0–100.0)
Monocytes Absolute: 0.8 10*3/uL (ref 0.1–1.0)
Monocytes Relative: 8 %
Neutro Abs: 8.5 10*3/uL — ABNORMAL HIGH (ref 1.7–7.7)
Neutrophils Relative %: 78 %
Platelet Count: 276 10*3/uL (ref 150–400)
RBC: 3.15 MIL/uL — ABNORMAL LOW (ref 3.87–5.11)
RDW: 12.6 % (ref 11.5–15.5)
WBC Count: 11 10*3/uL — ABNORMAL HIGH (ref 4.0–10.5)
nRBC: 0 % (ref 0.0–0.2)

## 2024-02-26 LAB — CMP (CANCER CENTER ONLY)
ALT: 21 U/L (ref 0–44)
AST: 22 U/L (ref 15–41)
Albumin: 3.9 g/dL (ref 3.5–5.0)
Alkaline Phosphatase: 81 U/L (ref 38–126)
Anion gap: 8 (ref 5–15)
BUN: 24 mg/dL — ABNORMAL HIGH (ref 8–23)
CO2: 25 mmol/L (ref 22–32)
Calcium: 9.4 mg/dL (ref 8.9–10.3)
Chloride: 98 mmol/L (ref 98–111)
Creatinine: 0.68 mg/dL (ref 0.44–1.00)
GFR, Estimated: >60 mL/min (ref >60)
Glucose, Bld: 91 mg/dL (ref 70–99)
Potassium: 4.3 mmol/L (ref 3.5–5.1)
Sodium: 131 mmol/L — ABNORMAL LOW (ref 135–145)
Total Bilirubin: 0.6 mg/dL (ref 0.0–1.2)
Total Protein: 7.2 g/dL (ref 6.5–8.1)

## 2024-02-26 MED ORDER — SODIUM CHLORIDE 0.9% FLUSH
10.0000 mL | INTRAVENOUS | Status: DC | PRN
  Administered 2024-02-26: 10 mL via INTRAVENOUS

## 2024-02-26 MED ORDER — SODIUM CHLORIDE 0.9 % IV SOLN
Freq: Once | INTRAVENOUS | Status: AC
Start: 2024-02-26 — End: 2024-02-26

## 2024-02-26 MED ORDER — SODIUM CHLORIDE 0.9 % IV SOLN
1500.0000 mg | Freq: Once | INTRAVENOUS | Status: AC
  Administered 2024-02-26: 1500 mg via INTRAVENOUS
  Filled 2024-02-26: qty 30

## 2024-02-26 NOTE — Progress Notes (Signed)
 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   PRIOR THERAPY: None   CURRENT THERAPY: Palliative systemic chemotherapy with carboplatin  for AUC of 5 on day 1, etoposide  100 mg/M2 on days 1, 2 and 3 as well as Imfinzi  1500 mg every 3 weeks with the chemotherapy and Neulasta  support. First dose on 03/16/2019. Status post 65 cycles.   Starting from cycle #5 the patient will be treated with maintenance treatment with Imfinzi  1500 mg IV every 4 weeks.  INTERVAL HISTORY: Brittany Marks 83 y.o. female returns to clinic today for follow-up visit.Discussed the use of AI scribe software for clinical note transcription with the patient, who gave verbal consent to proceed.  History of Present Illness   Brittany Marks "Deatra Face" is an 83 year old female with extensive stage small cell lung cancer who presents for evaluation before starting cycle number 66 of maintenance treatment with Imfinzi .  She was diagnosed with extensive stage small cell lung cancer in May 2020 and initially underwent palliative systemic chemotherapy with carboplatin , etoposide , and Imfinzi  for four cycles. Since then, she has been on maintenance treatment with Imfinzi  every four weeks and is currently preparing for her 66th cycle.  She resides in a skilled nursing facility, where she has been for about three months following a fall that resulted in a broken right arm. Her arm is healing well.  She was scheduled for a scan prior to this visit but canceled the appointment due to issues with the scheduling process, expressing frustration with the  process.  She feels fine overall with no new complaints. No nausea, vomiting, or diarrhea. She continues to use oxygen  at a rate of three liters per minute and uses it all the time.        MEDICAL HISTORY: Past Medical History:  Diagnosis Date   Acute CVA (cerebrovascular accident) (HCC) 12/07/2023   Cataract    Bilateral   Constipation    pain medication   Left rotator cuff tear    Right rotator cuff tear    SCL CA dx'd 11/2018    ALLERGIES:  is allergic to oxycodone .  MEDICATIONS:  Current Outpatient Medications  Medication Sig Dispense Refill   acetaminophen  (TYLENOL ) 325 MG tablet Take 2 tablets (650 mg total) by mouth every 4 (four) hours as needed for mild pain (pain score 1-3) (or temp > 37.5 C (99.5 F)).     albuterol  (VENTOLIN  HFA) 108 (90 Base) MCG/ACT inhaler Inhale 2 puffs into the lungs every 6 (six) hours as needed for wheezing or shortness of breath. 8 g 2   Ascorbic Acid (VITAMIN C PO) Take 1 tablet by mouth daily.     aspirin  EC 81 MG tablet Take 1 tablet (81 mg total) by mouth daily. Swallow whole.     atorvastatin  (LIPITOR) 40 MG tablet Take 1 tablet (40 mg total) by mouth daily.     b complex vitamins tablet Take 1 tablet by mouth daily.     gabapentin  (NEURONTIN ) 100 MG capsule Take 1 capsule (100 mg total) by mouth 2 (two) times daily. (Patient taking differently: Take 100 mg by mouth  See admin instructions. Take 100 mg by mouth in the morning and evening) 90 capsule 2   ibuprofen (ADVIL) 200 MG tablet Take 600 mg by mouth See admin instructions. Take 600 mg by mouth in the morning and evening     Multiple Vitamin (MULTIVITAMIN) tablet Take 1 tablet by mouth daily with breakfast.     nicotine  (NICODERM CQ  - DOSED IN MG/24 HOURS) 14 mg/24hr patch Place 14 mg onto the skin daily as needed (for the urge to smoke while hospitalized).     ondansetron  (ZOFRAN ) 4 MG tablet Take 1 tablet (4 mg total) by mouth every 8 (eight) hours as needed for nausea or vomiting. 10  tablet 0   oxymetazoline (AFRIN) 0.05 % nasal spray Place 2 sprays into both nostrils 2 (two) times daily as needed (for a bloody nose).     senna-docusate (SENOKOT-S) 8.6-50 MG tablet Take 1 tablet by mouth at bedtime as needed for mild constipation.     No current facility-administered medications for this visit.   Facility-Administered Medications Ordered in Other Visits  Medication Dose Route Frequency Provider Last Rate Last Admin   heparin  lock flush 100 unit/mL  500 Units Intracatheter Once PRN Magrinat, Gustav C, MD       sodium chloride  flush (NS) 0.9 % injection 10 mL  10 mL Intracatheter PRN Magrinat, Rozella Cornfield, MD       sodium chloride  flush (NS) 0.9 % injection 10 mL  10 mL Intravenous PRN Heilingoetter, Cassandra L, PA-C   10 mL at 02/26/24 1315    SURGICAL HISTORY:  Past Surgical History:  Procedure Laterality Date   CATARACT EXTRACTION W/ INTRAOCULAR LENS  IMPLANT, BILATERAL  2012   DECOMPRESSIVE LUMBAR LAMINECTOMY LEVEL 2 N/A 07/29/2013   Procedure: LUMBAR LAMINECTOMY, DECOMPRESSION LUMBAR THREE TO FOUR, FOUR TO FIVE microdiscectomy l3,4 right;  Surgeon: Florencia Hunter, MD;  Location: [facility];  Service: Orthopedics;  Laterality: N/A;   I & D SUPERIOR RIGHT SHOULDER AND CLOSURE WOUND  01-10-2011   S/P ROTATOR CUFF REPAIR   IR IMAGING GUIDED PORT INSERTION  04/09/2019   LUMBAR LAMINECTOMY  1970'S   LUMBAR LAMINECTOMY/DECOMPRESSION MICRODISCECTOMY N/A 06/27/2016   Procedure: L1 - L2 DISCECTOMY;  Surgeon: Mort Ards, MD;  Location: [facility];  Service: Orthopedics;  Laterality: N/A;   LUMBAR SPINE SURGERY  1983   REVERSE SHOULDER ARTHROPLASTY Left 03/10/2020   Procedure: REVERSE SHOULDER ARTHROPLASTY;  Surgeon: Ellard Gunning, MD;  Location: [facility];  Service: Orthopedics;  Laterality: Left;    RIGHT SHOULDER ARTHROSCOPY/ OPEN DISTAL CLAVICLE RESECTION/ SAD/ OPEN ROTATOR CUFF REPAIR  11-28-2010   SHOULDER FUSION SURGERY  03/11/2020   Shoulder Surgery , hardware placed    SHOULDER OPEN ROTATOR CUFF REPAIR  12/19/2011   Procedure: ROTATOR CUFF REPAIR SHOULDER OPEN;  Surgeon: Verlinda Gloss, MD;  Location: [facility];  Service: Orthopedics;  Laterality: Right;  RIGHT RECURRENT OPEN REPAIR OF THE ROTATOR CUFF WITH TISSUE MEND GRAFTANTERIOR CHROMIOECTOMY   VAGINAL HYSTERECTOMY  1979    REVIEW OF SYSTEMS:  A comprehensive review of systems was negative except for: Constitutional: positive for fatigue Respiratory: positive for dyspnea on exertion Musculoskeletal: positive for arthralgias and muscle weakness   PHYSICAL EXAMINATION: General appearance: alert, cooperative, fatigued, and no distress Head: Normocephalic, without obvious abnormality, atraumatic Neck: no adenopathy, no JVD, supple, symmetrical, trachea midline, and thyroid  not enlarged, symmetric, no tenderness/mass/nodules Lymph nodes: Cervical, supraclavicular, and axillary nodes normal. Resp: clear to auscultation bilaterally Back: symmetric, no  curvature. ROM normal. No CVA tenderness. Cardio: regular rate and rhythm, S1, S2 normal, no murmur, click, rub or gallop GI: soft, non-tender; bowel sounds normal; no masses,  no organomegaly Extremities: extremities normal, atraumatic, no cyanosis or edema   ECOG PERFORMANCE STATUS: 1 - Symptomatic but completely ambulatory  Blood pressure (!) 173/63, pulse 68, temperature 98.1 F (36.7 C), temperature source Temporal, resp. rate 17, height 5\' 2"  (1.575 m), SpO2 100%.  LABORATORY DATA: Lab Results  Component Value Date   WBC 11.3 (H) 01/29/2024   HGB 11.1 (L) 01/29/2024   HCT 31.8 (L) 01/29/2024   MCV 93.0 01/29/2024   PLT 269 01/29/2024      Chemistry      Component Value Date/Time   NA 132 (L) 01/29/2024 1115   K 4.2 01/29/2024 1115   CL 99 01/29/2024 1115   CO2 28 01/29/2024 1115   BUN 16 01/29/2024 1115   CREATININE 0.60 01/29/2024 1115      Component Value Date/Time   CALCIUM  9.5 01/29/2024 1115   ALKPHOS 74  01/29/2024 1115   AST 25 01/29/2024 1115   ALT 21 01/29/2024 1115   BILITOT 0.6 01/29/2024 1115       RADIOGRAPHIC STUDIES: No results found.    ASSESSMENT AND PLAN: This is a very pleasant 83 years old white female with extensive stage small cell lung cancer and she is currently undergoing treatment with carboplatin , etoposide  and Imfinzi  status post 5 cycles.  Starting from cycle #6 the patient is on maintenance treatment with single agent Imfinzi  every 4 weeks status post 65 cycles of total treatment. Assessment and Plan    Extensive stage small cell lung cancer Diagnosed in May 2020, currently on maintenance treatment with Imfinzi  every four weeks, having completed 65 cycles. No new complaints such as nausea, vomiting, or diarrhea. Responding well to treatment, surviving nearly five years, which is uncommon for this condition. Survival beyond five years is rare, highlighting the effectiveness of her current treatment regimen. - Administer cycle number 66 of Imfinzi . - Ensure completion of scan before next visit to assess treatment response.  Oxygen  dependence On continuous oxygen  therapy at 3 liters per minute.  Right arm fracture Fracture following a fall approximately three months ago. Healing is progressing well.   The patient was advised to call immediately if she has any other concerning symptoms in the interval. The patient voices understanding of current disease status and treatment options and is in agreement with the current care plan.  All questions were answered. The patient knows to call the clinic with any problems, questions or concerns. We can certainly see the patient much sooner if necessary. The total time spent in the appointment was 20 minutes.  Disclaimer: This note was dictated with voice recognition software. Similar sounding words can inadvertently be transcribed and may not be corrected upon review.

## 2024-02-26 NOTE — Patient Instructions (Signed)
 [facility] - A DEPT OF [facility]  Discharge Instructions: Thank you for choosing [facility] to provide your oncology and hematology care.   If you have a lab appointment with the Cancer Center, please go directly to the Cancer Center and check in at the registration area.   Wear comfortable clothing and clothing appropriate for easy access to any Portacath or PICC line.   We strive to give you quality time with your provider. You may need to reschedule your appointment if you arrive late (15 or more minutes).  Arriving late affects you and other patients whose appointments are after yours.  Also, if you miss three or more appointments without notifying the office, you may be dismissed from the clinic at the provider's discretion.      For prescription refill requests, have your pharmacy contact our office and allow 72 hours for refills to be completed.    Today you received the following chemotherapy and/or immunotherapy agents: imfinzi   To help prevent nausea and vomiting after your treatment, we encourage you to take your nausea medication as directed.  BELOW ARE SYMPTOMS THAT SHOULD BE REPORTED IMMEDIATELY: *FEVER GREATER THAN 100.4 F (38 C) OR HIGHER *CHILLS OR SWEATING *NAUSEA AND VOMITING THAT IS NOT CONTROLLED WITH YOUR NAUSEA MEDICATION *UNUSUAL SHORTNESS OF BREATH *UNUSUAL BRUISING OR BLEEDING *URINARY PROBLEMS (pain or burning when urinating, or frequent urination) *BOWEL PROBLEMS (unusual diarrhea, constipation, pain near the anus) TENDERNESS IN MOUTH AND THROAT WITH OR WITHOUT PRESENCE OF ULCERS (sore throat, sores in mouth, or a toothache) UNUSUAL RASH, SWELLING OR PAIN  UNUSUAL VAGINAL DISCHARGE OR ITCHING   Items with * indicate a potential emergency and should be followed up as soon as possible or go to the Emergency Department if any problems should occur.  Please show the CHEMOTHERAPY ALERT CARD or IMMUNOTHERAPY ALERT  CARD at check-in to the Emergency Department and triage nurse.  Should you have questions after your visit or need to cancel or reschedule your appointment, please contact [facility] - A DEPT OF [facility]  Dept: [phone]  and follow the prompts.  Office hours are 8:00 a.m. to 4:30 p.m. Monday - Friday. Please note that voicemails left after 4:00 p.m. may not be returned until the following business day.  We are closed weekends and major holidays. You have access to a nurse at all times for urgent questions. Please call the main number to the clinic Dept: [phone] and follow the prompts.   For any non-urgent questions, you may also contact your provider using MyChart. We now offer e-Visits for anyone 65 and older to request care online for non-urgent symptoms. For details visit [email or web address].   Also download the MyChart app! Go to the app store, search "MyChart", open the app, select [facility], and log in with your MyChart username and password.

## 2024-03-01 ENCOUNTER — Other Ambulatory Visit: Payer: Self-pay

## 2024-03-07 ENCOUNTER — Other Ambulatory Visit: Payer: Self-pay

## 2024-03-13 ENCOUNTER — Ambulatory Visit (HOSPITAL_COMMUNITY)
Admission: RE | Admit: 2024-03-13 | Discharge: 2024-03-13 | Disposition: A | Discharge status: Home / Self Care | Source: Ambulatory Visit | Attending: Internal Medicine | Admitting: Internal Medicine

## 2024-03-13 ENCOUNTER — Other Ambulatory Visit (HOSPITAL_COMMUNITY)

## 2024-03-13 DIAGNOSIS — C3490 Malignant neoplasm of unspecified part of unspecified bronchus or lung: Secondary | ICD-10-CM | POA: Diagnosis present

## 2024-03-16 ENCOUNTER — Telehealth: Payer: Self-pay

## 2024-03-16 NOTE — Telephone Encounter (Signed)
 Spoke with [name] this morning regarding the patient's upcoming appointment on 03/23/2024. Brittany Marks stated that the patient has been at [facility] and is expected to return home today with home health services in place. [name] was calling to confirm the scheduled appointments and to provide a rough estimate of the time for home health services to transport the patient to and from the appointments.

## 2024-03-18 ENCOUNTER — Emergency Department (HOSPITAL_COMMUNITY)

## 2024-03-18 ENCOUNTER — Telehealth: Payer: Self-pay | Admitting: Medical Oncology

## 2024-03-18 ENCOUNTER — Other Ambulatory Visit: Payer: Self-pay

## 2024-03-18 ENCOUNTER — Encounter (HOSPITAL_COMMUNITY): Payer: Self-pay

## 2024-03-18 ENCOUNTER — Inpatient Hospital Stay (HOSPITAL_COMMUNITY)
Admission: EM | Admit: 2024-03-18 | Discharge: 2024-03-26 | DRG: 871 | Disposition: A | Discharge status: Home Health | Attending: Family Medicine | Admitting: Family Medicine

## 2024-03-18 DIAGNOSIS — J9621 Acute and chronic respiratory failure with hypoxia: Secondary | ICD-10-CM | POA: Diagnosis present

## 2024-03-18 DIAGNOSIS — C3411 Malignant neoplasm of upper lobe, right bronchus or lung: Secondary | ICD-10-CM | POA: Diagnosis present

## 2024-03-18 DIAGNOSIS — J189 Pneumonia, unspecified organism: Secondary | ICD-10-CM | POA: Diagnosis present

## 2024-03-18 DIAGNOSIS — R531 Weakness: Secondary | ICD-10-CM | POA: Diagnosis not present

## 2024-03-18 DIAGNOSIS — Z79620 Long term (current) use of immunosuppressive biologic: Secondary | ICD-10-CM | POA: Diagnosis not present

## 2024-03-18 DIAGNOSIS — R7989 Other specified abnormal findings of blood chemistry: Secondary | ICD-10-CM

## 2024-03-18 DIAGNOSIS — Z66 Do not resuscitate: Secondary | ICD-10-CM | POA: Diagnosis present

## 2024-03-18 DIAGNOSIS — I21A1 Myocardial infarction type 2: Secondary | ICD-10-CM | POA: Diagnosis present

## 2024-03-18 DIAGNOSIS — Z79899 Other long term (current) drug therapy: Secondary | ICD-10-CM

## 2024-03-18 DIAGNOSIS — C7951 Secondary malignant neoplasm of bone: Secondary | ICD-10-CM | POA: Diagnosis present

## 2024-03-18 DIAGNOSIS — Z602 Problems related to living alone: Secondary | ICD-10-CM | POA: Diagnosis present

## 2024-03-18 DIAGNOSIS — M84521D Pathological fracture in neoplastic disease, right humerus, subsequent encounter for fracture with routine healing: Secondary | ICD-10-CM | POA: Diagnosis present

## 2024-03-18 DIAGNOSIS — Z7409 Other reduced mobility: Secondary | ICD-10-CM | POA: Diagnosis present

## 2024-03-18 DIAGNOSIS — Z96612 Presence of left artificial shoulder joint: Secondary | ICD-10-CM | POA: Diagnosis present

## 2024-03-18 DIAGNOSIS — R400 Somnolence: Secondary | ICD-10-CM | POA: Diagnosis not present

## 2024-03-18 DIAGNOSIS — Z23 Encounter for immunization: Secondary | ICD-10-CM | POA: Diagnosis present

## 2024-03-18 DIAGNOSIS — G9340 Encephalopathy, unspecified: Secondary | ICD-10-CM | POA: Diagnosis not present

## 2024-03-18 DIAGNOSIS — E871 Hypo-osmolality and hyponatremia: Secondary | ICD-10-CM | POA: Diagnosis present

## 2024-03-18 DIAGNOSIS — A419 Sepsis, unspecified organism: Principal | ICD-10-CM | POA: Diagnosis present

## 2024-03-18 DIAGNOSIS — J9611 Chronic respiratory failure with hypoxia: Secondary | ICD-10-CM | POA: Diagnosis present

## 2024-03-18 DIAGNOSIS — Z9981 Dependence on supplemental oxygen: Secondary | ICD-10-CM

## 2024-03-18 DIAGNOSIS — I5022 Chronic systolic (congestive) heart failure: Secondary | ICD-10-CM | POA: Diagnosis present

## 2024-03-18 DIAGNOSIS — C7989 Secondary malignant neoplasm of other specified sites: Secondary | ICD-10-CM | POA: Diagnosis present

## 2024-03-18 DIAGNOSIS — Z7982 Long term (current) use of aspirin: Secondary | ICD-10-CM

## 2024-03-18 DIAGNOSIS — Z90710 Acquired absence of both cervix and uterus: Secondary | ICD-10-CM

## 2024-03-18 DIAGNOSIS — E785 Hyperlipidemia, unspecified: Secondary | ICD-10-CM | POA: Diagnosis present

## 2024-03-18 DIAGNOSIS — I2489 Other forms of acute ischemic heart disease: Secondary | ICD-10-CM | POA: Insufficient documentation

## 2024-03-18 DIAGNOSIS — Z885 Allergy status to narcotic agent status: Secondary | ICD-10-CM

## 2024-03-18 DIAGNOSIS — Z981 Arthrodesis status: Secondary | ICD-10-CM

## 2024-03-18 DIAGNOSIS — G9341 Metabolic encephalopathy: Secondary | ICD-10-CM | POA: Diagnosis present

## 2024-03-18 DIAGNOSIS — J440 Chronic obstructive pulmonary disease with acute lower respiratory infection: Secondary | ICD-10-CM | POA: Diagnosis present

## 2024-03-18 DIAGNOSIS — R4182 Altered mental status, unspecified: Secondary | ICD-10-CM | POA: Diagnosis present

## 2024-03-18 DIAGNOSIS — Z720 Tobacco use: Secondary | ICD-10-CM | POA: Diagnosis present

## 2024-03-18 DIAGNOSIS — Z8249 Family history of ischemic heart disease and other diseases of the circulatory system: Secondary | ICD-10-CM

## 2024-03-18 DIAGNOSIS — Z8673 Personal history of transient ischemic attack (TIA), and cerebral infarction without residual deficits: Secondary | ICD-10-CM

## 2024-03-18 DIAGNOSIS — I214 Non-ST elevation (NSTEMI) myocardial infarction: Secondary | ICD-10-CM

## 2024-03-18 DIAGNOSIS — T451X5A Adverse effect of antineoplastic and immunosuppressive drugs, initial encounter: Secondary | ICD-10-CM | POA: Diagnosis present

## 2024-03-18 DIAGNOSIS — Z515 Encounter for palliative care: Secondary | ICD-10-CM

## 2024-03-18 DIAGNOSIS — Z961 Presence of intraocular lens: Secondary | ICD-10-CM | POA: Diagnosis present

## 2024-03-18 DIAGNOSIS — Z9841 Cataract extraction status, right eye: Secondary | ICD-10-CM

## 2024-03-18 DIAGNOSIS — Z9842 Cataract extraction status, left eye: Secondary | ICD-10-CM

## 2024-03-18 DIAGNOSIS — N390 Urinary tract infection, site not specified: Secondary | ICD-10-CM | POA: Diagnosis present

## 2024-03-18 DIAGNOSIS — F17210 Nicotine dependence, cigarettes, uncomplicated: Secondary | ICD-10-CM | POA: Diagnosis present

## 2024-03-18 DIAGNOSIS — E876 Hypokalemia: Secondary | ICD-10-CM | POA: Diagnosis present

## 2024-03-18 DIAGNOSIS — G620 Drug-induced polyneuropathy: Secondary | ICD-10-CM | POA: Diagnosis present

## 2024-03-18 DIAGNOSIS — D72829 Elevated white blood cell count, unspecified: Secondary | ICD-10-CM | POA: Diagnosis present

## 2024-03-18 DIAGNOSIS — D6869 Other thrombophilia: Secondary | ICD-10-CM | POA: Diagnosis present

## 2024-03-18 LAB — CBC WITH DIFFERENTIAL/PLATELET
Abs Immature Granulocytes: 0.14 10*3/uL — ABNORMAL HIGH (ref 0.00–0.07)
Basophils Absolute: 0.1 10*3/uL (ref 0.0–0.1)
Basophils Relative: 0 %
Eosinophils Absolute: 0.1 10*3/uL (ref 0.0–0.5)
Eosinophils Relative: 1 %
HCT: 32.3 % — ABNORMAL LOW (ref 36.0–46.0)
Hemoglobin: 10.7 g/dL — ABNORMAL LOW (ref 12.0–15.0)
Immature Granulocytes: 1 %
Lymphocytes Relative: 4 %
Lymphs Abs: 0.7 10*3/uL (ref 0.7–4.0)
MCH: 32.1 pg (ref 26.0–34.0)
MCHC: 33.1 g/dL (ref 30.0–36.0)
MCV: 97 fL (ref 80.0–100.0)
Monocytes Absolute: 1 10*3/uL (ref 0.1–1.0)
Monocytes Relative: 6 %
Neutro Abs: 16.4 10*3/uL — ABNORMAL HIGH (ref 1.7–7.7)
Neutrophils Relative %: 88 %
Platelets: 312 10*3/uL (ref 150–400)
RBC: 3.33 MIL/uL — ABNORMAL LOW (ref 3.87–5.11)
RDW: 13.1 % (ref 11.5–15.5)
WBC: 18.5 10*3/uL — ABNORMAL HIGH (ref 4.0–10.5)
nRBC: 0 % (ref 0.0–0.2)

## 2024-03-18 LAB — COMPREHENSIVE METABOLIC PANEL WITH GFR
ALT: 19 U/L (ref 0–44)
AST: 22 U/L (ref 15–41)
Albumin: 3.3 g/dL — ABNORMAL LOW (ref 3.5–5.0)
Alkaline Phosphatase: 97 U/L (ref 38–126)
Anion gap: 11 (ref 5–15)
BUN: 28 mg/dL — ABNORMAL HIGH (ref 8–23)
CO2: 21 mmol/L — ABNORMAL LOW (ref 22–32)
Calcium: 9.6 mg/dL (ref 8.9–10.3)
Chloride: 101 mmol/L (ref 98–111)
Creatinine, Ser: 0.96 mg/dL (ref 0.44–1.00)
GFR, Estimated: 59 mL/min — ABNORMAL LOW (ref >60)
Glucose, Bld: 104 mg/dL — ABNORMAL HIGH (ref 70–99)
Potassium: 3.1 mmol/L — ABNORMAL LOW (ref 3.5–5.1)
Sodium: 133 mmol/L — ABNORMAL LOW (ref 135–145)
Total Bilirubin: 1.2 mg/dL (ref 0.0–1.2)
Total Protein: 7.5 g/dL (ref 6.5–8.1)

## 2024-03-18 LAB — TROPONIN I (HIGH SENSITIVITY)
Troponin I (High Sensitivity): 489 ng/L (ref <18)
Troponin I (High Sensitivity): 546 ng/L (ref <18)

## 2024-03-18 LAB — I-STAT CG4 LACTIC ACID, ED: Lactic Acid, Venous: 1 mmol/L (ref 0.5–1.9)

## 2024-03-18 LAB — BLOOD GAS, VENOUS
Acid-base deficit: 4.7 mmol/L — ABNORMAL HIGH (ref 0.0–2.0)
Bicarbonate: 19.5 mmol/L — ABNORMAL LOW (ref 20.0–28.0)
O2 Saturation: 87.3 %
Patient temperature: 37
pCO2, Ven: 33 mmHg — ABNORMAL LOW (ref 44–60)
pH, Ven: 7.38 (ref 7.25–7.43)
pO2, Ven: 52 mmHg — ABNORMAL HIGH (ref 32–45)

## 2024-03-18 LAB — LIPASE, BLOOD: Lipase: 28 U/L (ref 11–51)

## 2024-03-18 MED ORDER — POTASSIUM CHLORIDE 10 MEQ/100ML IV SOLN
10.0000 meq | Freq: Once | INTRAVENOUS | Status: AC
  Administered 2024-03-18: 10 meq via INTRAVENOUS
  Filled 2024-03-18: qty 100

## 2024-03-18 MED ORDER — HEPARIN (PORCINE) 25000 UT/250ML-% IV SOLN
900.0000 [IU]/h | INTRAVENOUS | Status: DC
  Administered 2024-03-18: 700 [IU]/h via INTRAVENOUS
  Filled 2024-03-18 (×2): qty 250

## 2024-03-18 MED ORDER — SODIUM CHLORIDE 0.9 % IV BOLUS
500.0000 mL | Freq: Once | INTRAVENOUS | Status: AC
  Administered 2024-03-18: 500 mL via INTRAVENOUS

## 2024-03-18 MED ORDER — IOHEXOL 350 MG/ML SOLN
75.0000 mL | Freq: Once | INTRAVENOUS | Status: AC | PRN
  Administered 2024-03-18: 75 mL via INTRAVENOUS

## 2024-03-18 MED ORDER — CEFTRIAXONE SODIUM 1 G IJ SOLR
1.0000 g | Freq: Once | INTRAMUSCULAR | Status: AC
  Administered 2024-03-18: 1 g via INTRAVENOUS
  Filled 2024-03-18: qty 10

## 2024-03-18 MED ORDER — HEPARIN BOLUS VIA INFUSION
4000.0000 [IU] | Freq: Once | INTRAVENOUS | Status: AC
  Administered 2024-03-18: 4000 [IU] via INTRAVENOUS
  Filled 2024-03-18: qty 4000

## 2024-03-18 MED ORDER — SODIUM CHLORIDE 0.9 % IV SOLN
500.0000 mg | Freq: Once | INTRAVENOUS | Status: AC
  Administered 2024-03-19: 500 mg via INTRAVENOUS
  Filled 2024-03-18: qty 5

## 2024-03-18 NOTE — H&P (Addendum)
 History and Physical    Brittany Marks YPP:[id number] DOB: 20-Mar-1941 DOA: 03/18/2024  PCP: Pcp, No  Patient coming from: home  I have personally briefly reviewed patient's old medical records in [facility]  Chief Complaint: change in ms increase weakness x 1 week  HPI: Brittany Marks is a 83 y.o. 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 Patient presents to ED BIBEMS from home with  increase weakness/lethargy x 1 week and today noted to be more confused from baseline. There was also mention of episode of hypoxemia  with sat 82% on chronic home O2. Patient currently is somnolent and not able to give history. History is take primary from chart. ED Course:  In ED vitals afeb, bp 120/72, HR 108, rr 32  sat 87% on 4L increased to 6L to 98% weaned to 5L. Patient on evaluation found to have UTI, pneumonia ,as well as NSTEMI presumed type II, as well as progression of metastatic disease. CTH NAD however.  EKG Sinus tachycardia   Wbc: 18.5, hgb 10.7 at baseline, plt 312, +left shift Vbg:7.38/pco2 33 Na 133, K 3.1, bicarb 21, cr 0.96  CE 489,546, 504,502 CTH IMPRESSION: 1. No acute intracranial abnormality, specifically, no acute hemorrhage, territorial infarction, or intracranial mass. 2. Similar cerebral volume loss with sequelae of advanced chronic ischemic microvascular disease.  CTPE IMPRESSION: 1. Negative for acute pulmonary embolism. 2. Similar pulmonary fibrosis with superimposed interstitial and airspace opacities particularly in the left greater than right lower lobes. Favor superimposed pneumonia. 3. Slightly  increased size of the subpleural mass along the anterolateral left upper lobe, left infrahilar mass, and mediastinal lymphadenopathy since 03/13/2024. Given short interval increase this may be related to pneumonia. Rapidly progressive malignancy/metastatic disease is not excluded. 4. Aortic Atherosclerosis (ICD10-I70.0). UA: wbc 11-20,many bacteria   Tx heparin  drip ,potassium ctx/azithromycin Review of Systems: As per HPI otherwise 10 point review of systems negative.   Past Medical History:  Diagnosis Date   Acute CVA (cerebrovascular accident) (HCC) 12/07/2023   Cataract    Bilateral   Constipation    pain medication   Left rotator cuff tear    Right rotator cuff tear    SCL CA dx'd 11/2018    Past Surgical History:  Procedure Laterality Date   CATARACT EXTRACTION W/ INTRAOCULAR LENS  IMPLANT, BILATERAL  2012   DECOMPRESSIVE LUMBAR LAMINECTOMY LEVEL 2 N/A 07/29/2013   Procedure: LUMBAR LAMINECTOMY, DECOMPRESSION LUMBAR THREE TO FOUR, FOUR TO FIVE microdiscectomy l3,4 right;  Surgeon: Florencia Hunter, MD;  Location: [facility];  Service: Orthopedics;  Laterality: N/A;   I & D SUPERIOR RIGHT SHOULDER AND CLOSURE WOUND  01-10-2011   S/P ROTATOR CUFF REPAIR   IR IMAGING GUIDED PORT INSERTION  04/09/2019   LUMBAR LAMINECTOMY  1970'S   LUMBAR LAMINECTOMY/DECOMPRESSION MICRODISCECTOMY N/A 06/27/2016   Procedure: L1 - L2 DISCECTOMY;  Surgeon: Mort Ards, MD;  Location: [facility];  Service: Orthopedics;  Laterality: N/A;   LUMBAR SPINE SURGERY  1983   REVERSE SHOULDER ARTHROPLASTY Left 03/10/2020   Procedure: REVERSE SHOULDER ARTHROPLASTY;  Surgeon: Ellard Gunning, MD;  Location: [facility];  Service: Orthopedics;  Laterality: Left;    RIGHT SHOULDER ARTHROSCOPY/ OPEN DISTAL CLAVICLE RESECTION/  SAD/ OPEN ROTATOR CUFF REPAIR  11-28-2010   SHOULDER FUSION SURGERY  03/11/2020   Shoulder Surgery , hardware placed   SHOULDER OPEN ROTATOR CUFF REPAIR  12/19/2011   Procedure: ROTATOR CUFF REPAIR  SHOULDER OPEN;  Surgeon: Verlinda Gloss, MD;  Location: [facility];  Service: Orthopedics;  Laterality: Right;  RIGHT RECURRENT OPEN REPAIR OF THE ROTATOR CUFF WITH TISSUE MEND GRAFTANTERIOR CHROMIOECTOMY   VAGINAL HYSTERECTOMY  1979     reports that she has been smoking cigarettes. She has a 25 pack-year smoking history. She has never used smokeless tobacco. She reports current alcohol  use. She reports that she does not use drugs.  Allergies  Allergen Reactions   Oxycodone  Other (See Comments)    Unsteady gait, fall, altered mental status    Family History  Problem Relation Age of Onset   Congestive Heart Failure Mother    Congestive Heart Failure Father     Prior to Admission medications   Medication Sig Start Date End Date Taking? Authorizing Provider  acetaminophen  (TYLENOL ) 325 MG tablet Take 2 tablets (650 mg total) by mouth every 4 (four) hours as needed for mild pain (pain score 1-3) (or temp > 37.5 C (99.5 F)). 12/11/23  Yes [place], Rema Care, DO  albuterol  (VENTOLIN  HFA) 108 (90 Base) MCG/ACT inhaler Inhale 2 puffs into the lungs every 6 (six) hours as needed for wheezing or shortness of breath. 08/07/22  Yes Heilingoetter, Cassandra L, PA-C  Ascorbic Acid (VITAMIN C PO) Take 1 tablet by mouth daily.   Yes [provider]  aspirin  EC 81 MG tablet Take 1 tablet (81 mg total) by mouth daily. Swallow whole. 12/12/23  Yes [place], Eric J, DO  atorvastatin  (LIPITOR) 40 MG tablet Take 1 tablet (40 mg total) by mouth daily. 12/12/23  Yes [place], Eric J, DO  b complex vitamins tablet Take 1 tablet by mouth daily.   Yes [provider]  durvalumab  1,500 mg in sodium chloride  0.9 % 100 mL Inject 1,500 mg into the vein every 28 (twenty-eight) days.   Yes [provider]  gabapentin  (NEURONTIN ) 100 MG capsule Take 1 capsule (100 mg total) by mouth 2 (two) times daily. Patient taking differently: Take 100 mg by mouth See admin instructions. Take 100 mg  by mouth in the morning and evening 12/03/23  Yes Marlene Simas, MD  ibuprofen (ADVIL) 600 MG tablet Take 600 mg by mouth 2 (two) times daily as needed. 03/17/24  Yes [provider]  Multiple Vitamin (MULTIVITAMIN) tablet Take 1 tablet by mouth daily with breakfast.   Yes [provider]  nicotine  (NICODERM CQ  - DOSED IN MG/24 HOURS) 14 mg/24hr patch Place 14 mg onto the skin daily as needed (for the urge to smoke while hospitalized).   Yes [provider]  senna-docusate (SENOKOT-S) 8.6-50 MG tablet Take 1 tablet by mouth at bedtime as needed for mild constipation. 12/11/23  Yes [place], Eric J, DO  ondansetron  (ZOFRAN ) 4 MG tablet Take 1 tablet (4 mg total) by mouth every 8 (eight) hours as needed for nausea or vomiting. 03/11/20   Shuford, Sherrlyn Dolores, PA-C  oxymetazoline (AFRIN) 0.05 % nasal spray Place 2 sprays into both nostrils 2 (two) times daily as needed (for a bloody nose).    [provider]    Physical Exam: Vitals:   03/18/24 1900 03/18/24 2000 03/18/24 2100 03/18/24 2235  BP: (!) 141/63 (!) 141/65 (!) 137/55 130/88  Pulse: 100 (!) 106 99 (!) 110  Resp: Aaron Aas)  43 (!) 29 (!) 31 (!) 24  Temp:  99.5 F (37.5 C)    TempSrc:  Oral    SpO2: 97% 91% 97% (!) 87%  Weight:      Height:        Constitutional: NAD, calm, lethargic appears ill Vitals:   03/18/24 1900 03/18/24 2000 03/18/24 2100 03/18/24 2235  BP: (!) 141/63 (!) 141/65 (!) 137/55 130/88  Pulse: 100 (!) 106 99 (!) 110  Resp: (!) 43 (!) 29 (!) 31 (!) 24  Temp:  99.5 F (37.5 C)    TempSrc:  Oral    SpO2: 97% 91% 97% (!) 87%  Weight:      Height:       Eyes:  lids and conjunctivae normal ENMT: Mucous membranes are moist. Posterior pharynx clear of any exudate or lesions.Normal dentition.  Neck: normal, supple, no masses, no thyromegaly Respiratory: clear to auscultation bilaterally, no wheezing, no crackles. Normal respiratory effort. No accessory muscle use.  Cardiovascular: Regular rate  and rhythm, no murmurs / rubs / gallops. . .  Abdomen: no tenderness, no masses palpated. No hepatosplenomegaly. Bowel sounds positive.  Musculoskeletal: no clubbing / cyanosis. No joint deformity upper and lower extremities. Good ROM, no contractures. Normal muscle tone.  Skin: no rashes, lesions, ulcers. No induration Neurologic: CN 2-12 grossly intact. Sensation intact Psychiatric: patient very somnolent difficult to assess  Labs on Admission: I have personally reviewed following labs and imaging studies  CBC: Recent Labs  Lab 03/18/24 1708  WBC 18.5*  NEUTROABS 16.4*  HGB 10.7*  HCT 32.3*  MCV 97.0  PLT 312   Basic Metabolic Panel: Recent Labs  Lab 03/18/24 1708  NA 133*  K 3.1*  CL 101  CO2 21*  GLUCOSE 104*  BUN 28*  CREATININE 0.96  CALCIUM  9.6   GFR: Estimated Creatinine Clearance: 38.6 mL/min (by C-G formula based on SCr of 0.96 mg/dL). Liver Function Tests: Recent Labs  Lab 03/18/24 1708  AST 22  ALT 19  ALKPHOS 97  BILITOT 1.2  PROT 7.5  ALBUMIN 3.3*   Recent Labs  Lab 03/18/24 1708  LIPASE 28   No results for input(s): AMMONIA in the last 168 hours. Coagulation Profile: No results for input(s): INR, PROTIME in the last 168 hours. Cardiac Enzymes: No results for input(s): CKTOTAL, CKMB, CKMBINDEX, TROPONINI in the last 168 hours. BNP (last 3 results) No results for input(s): PROBNP in the last 8760 hours. HbA1C: No results for input(s): HGBA1C in the last 72 hours. CBG: No results for input(s): GLUCAP in the last 168 hours. Lipid Profile: No results for input(s): CHOL, HDL, LDLCALC, TRIG, CHOLHDL, LDLDIRECT in the last 72 hours. Thyroid  Function Tests: No results for input(s): TSH, T4TOTAL, FREET4, T3FREE, THYROIDAB in the last 72 hours. Anemia Panel: No results for input(s): VITAMINB12, FOLATE, FERRITIN, TIBC, IRON, RETICCTPCT in the last 72 hours. Urine analysis:    Component Value  Date/Time   COLORURINE YELLOW 12/07/2023 1719   APPEARANCEUR CLEAR 12/07/2023 1719   LABSPEC 1.023 12/07/2023 1719   PHURINE 5.0 12/07/2023 1719   GLUCOSEU NEGATIVE 12/07/2023 1719   HGBUR NEGATIVE 12/07/2023 1719   BILIRUBINUR NEGATIVE 12/07/2023 1719   KETONESUR NEGATIVE 12/07/2023 1719   PROTEINUR NEGATIVE 12/07/2023 1719   UROBILINOGEN 0.2 07/23/2013 1332   NITRITE NEGATIVE 12/07/2023 1719   LEUKOCYTESUR TRACE (A) 12/07/2023 1719    Radiological Exams on Admission: CT Angio Chest PE W/Cm &/Or Wo Cm Result Date: 03/18/2024 CLINICAL DATA:  Increased weakness progressing through  the week. Slight change in mentation. History of COPD and lung cancer. EXAM: CT ANGIOGRAPHY CHEST WITH CONTRAST TECHNIQUE: Multidetector CT imaging of the chest was performed using the standard protocol during bolus administration of intravenous contrast. Multiplanar CT image reconstructions and MIPs were obtained to evaluate the vascular anatomy. RADIATION DOSE REDUCTION: This exam was performed according to the departmental dose-optimization program which includes automated exposure control, adjustment of the mA and/or kV according to patient size and/or use of iterative reconstruction technique. CONTRAST:  75mL OMNIPAQUE  IOHEXOL  350 MG/ML SOLN COMPARISON:  Same day chest radiograph and CT 03/13/2024 FINDINGS: Cardiovascular: Cardiomegaly. No pericardial effusion. Negative for acute pulmonary embolism. Coronary artery and aortic atherosclerotic calcification. Normal caliber thoracic aorta. Mediastinum/Nodes: Small hiatal hernia. Trachea is unremarkable. Increased soft tissue thickening about the left hilum now measuring 4.5 x 3.2 cm, previously 3.3 x 2.9 cm. Increased size of a pretracheal node on series 10/image 86 now measuring 1.1 cm, previously 0.7 cm. New right infrahilar lymph node measuring 1.9 cm (series 10/image 156). Lungs/Pleura: Similar pulmonary fibrosis including peripheral interstitial and reticular  opacities, traction bronchiectasis, and honeycombing at the lung bases. There is superimposed interstitial and airspace opacities particularly in the left greater than right lower lobes. Trace left pleural effusion. No pneumothorax. The subpleural mass along the anterolateral left upper lobe measures 3.2 x 3.6 cm. Using similar measuring technique this measured 3.1 x 2.8 cm on 03/13/2024. The adjacent left upper lobe nodule on 4/33 is unchanged and again measures 1.2 cm. Upper Abdomen: No acute abnormality. Musculoskeletal: Left reverse TSA. Advanced arthritis right shoulder. No acute fracture. No destructive osseous lesion. Review of the MIP images confirms the above findings. IMPRESSION: 1. Negative for acute pulmonary embolism. 2. Similar pulmonary fibrosis with superimposed interstitial and airspace opacities particularly in the left greater than right lower lobes. Favor superimposed pneumonia. 3. Slightly increased size of the subpleural mass along the anterolateral left upper lobe, left infrahilar mass, and mediastinal lymphadenopathy since 03/13/2024. Given short interval increase this may be related to pneumonia. Rapidly progressive malignancy/metastatic disease is not excluded. 4. Aortic Atherosclerosis (ICD10-I70.0). Electronically Signed   By: Rozell Cornet M.D.   On: 03/18/2024 23:00   CT Head Wo Contrast Result Date: 03/18/2024 CLINICAL DATA:  Mental status change, unknown cause EXAM: CT HEAD WITHOUT CONTRAST TECHNIQUE: Contiguous axial images were obtained from the base of the skull through the vertex without intravenous contrast. RADIATION DOSE REDUCTION: This exam was performed according to the departmental dose-optimization program which includes automated exposure control, adjustment of the mA and/or kV according to patient size and/or use of iterative reconstruction technique. COMPARISON:  December 07, 2023 FINDINGS: Brain: The ventricles appear age appropriate. No mass effect or midline shift.  Gray-white differentiation is preserved.Confluent periventricular and subcortical white matter hypoattenuation, most consistent with changes of severe chronic ischemic microvascular disease.No evidence of acute territorial infarction, extra-axial fluid collection, hemorrhage, or mass lesion. Remote lacunar infarct within the left basal ganglia. The basilar cisterns are patent without downward herniation. The cerebellar hemispheres and vermis are well formed without mass lesion or focal attenuation abnormality. Vascular: No hyperdense vessel. Calcified atherosclerotic plaque within the cavernous/supraclinoid ICA and intradural vertebral arteries. Skull: The skull base is especially degraded by patient motion. Negative for fracture or focal lesion. Sinuses/Orbits: Degraded by patient motion. The paranasal sinuses and mastoids are clear.No retrobulbar hematoma. Other: None. IMPRESSION: 1. No acute intracranial abnormality, specifically, no acute hemorrhage, territorial infarction, or intracranial mass. 2. Similar cerebral volume loss with sequelae of advanced  chronic ischemic microvascular disease. Electronically Signed   By: Rance Burrows M.D.   On: 03/18/2024 18:48   DG Chest Port 1 View Result Date: 03/18/2024 CLINICAL DATA:  Shortness of breath, weakness. EXAM: PORTABLE CHEST 1 VIEW COMPARISON:  December 26, 2018. FINDINGS: Stable cardiomediastinal silhouette. Status post left shoulder arthroplasty. Right internal jugular Port-A-Cath is noted with distal tip in expected position of cavoatrial junction. Mild diffuse reticular densities are noted throughout both lungs concerning for edema or atypical inflammation. Small left pleural effusion is noted. IMPRESSION: Mild diffuse reticular densities are noted throughout both lungs concerning for edema or atypical inflammation. Small left pleural effusion is noted. Electronically Signed   By: Rosalene Colon M.D.   On: 03/18/2024 16:43    EKG: Independently reviewed.    Assessment/Plan  CAP presumed bacterial with Acute on Chronic hypoxic respiratory failure  -patient requiring increase O2 from baseline -admit to progressive care -ctx/ azithromycin, de-escalate as able  -pulmonary toilet  -urine ag, sputum, f/u on culture data  -wean O2 back to baseline as able ( 3-4L at baseline)  NSTEMI, presumed Type II -case discussed with cardiologist on call by EDP  - no need for transfer to [facility] , not a candidate for cath  - continue asa/statin and heparin  drip  - will need formal cardiology consult in am   UTI -continue on CTX  -f/u on urine culture   Acute Metabolic Encephalopathy  -treat underlying cause   Progression of metastatic lung cancer  - on palliative chemo /followed by Dr Marguerita Shih - palliative care in consult in am   Hx of CVA  -12/2023 presumed embolic secondary to hypercoagulable state from advanced malignancy -continue on ASA, patient not interested in full dose anticoagulation   HLD -continue atorvastatin  for now  Chemo induced neuropathy -holding gabapentin  due to Acute encephalopathy  Hx of CHFref -recent echo ef 40-45 -no acute decompensation noted  -not on GDMT or diuretic therapy     DVT prophylaxis: heparin  drip  Code Status: DNR/DNI per chart review Family Communication: none at bedside Disposition Plan: Consults called: will need cardiology /palliative care consult  in am  Admission status: progressive care    Sabas Cradle MD [facility]   If 7PM-7AM, please contact night-coverage [email or web address] Password [facility]  03/18/2024, 11:44 PM

## 2024-03-18 NOTE — Progress Notes (Signed)
 PHARMACY - ANTICOAGULATION CONSULT NOTE  Pharmacy Consult for IV heparin  Indication: chest pain/ACS  Allergies  Allergen Reactions   Oxycodone  Other (See Comments)    Confusion/ near fall    Patient Measurements: Height: 5' 2 (157.5 cm) Weight: 60.2 kg (132 lb 11.5 oz) IBW/kg (Calculated) : 50.1 HEPARIN  DW (KG): 60.2  Vital Signs: Temp: 98 F (36.7 C) (06/11 1537) Temp Source: Oral (06/11 1537) BP: 141/63 (06/11 1900) Pulse Rate: 100 (06/11 1900)  Labs: Recent Labs    03/18/24 1708  HGB 10.7*  HCT 32.3*  PLT 312  CREATININE 0.96  TROPONINIHS 489*    Estimated Creatinine Clearance: 38.6 mL/min (by C-G formula based on SCr of 0.96 mg/dL).   Medical History: Past Medical History:  Diagnosis Date   Acute CVA (cerebrovascular accident) (HCC) 12/07/2023   Cataract    Bilateral   Constipation    pain medication   Left rotator cuff tear    Right rotator cuff tear    SCL CA dx'd 11/2018    Medications:  Scheduled:  Infusions:   potassium chloride       Assessment: Patient presented with weakness found to have possible ACS to start IV heparin . Elevated troponin's. Baseline labs drawn. Not on any anticoagulation meds prior to admission.   Goal of Therapy:  Heparin  level 0.3-0.7 units/ml Monitor platelets by anticoagulation protocol: Yes   Plan:  IV heparin  4000 unit bolus then IV heparin  infusion rate of 700 units/hr Check heparin  level in 8 hours Daily CBC   Bernett Brill 03/18/2024,7:25 PM

## 2024-03-18 NOTE — ED Notes (Signed)
 Patient transported to CT

## 2024-03-18 NOTE — Telephone Encounter (Signed)
 err

## 2024-03-18 NOTE — Telephone Encounter (Signed)
 Brittany Marks has returned home following a 96-day stay at [facility], where she was recovering from a fractured arm. During her inpatient stay, she was also diagnosed with an acute CVA.This has left her R arm weak which has made it difficult to get around independently.  To support her transition and ongoing needs at home, Brittany Marks has arranged services through [facility]. Home health aides are scheduled to assist Brittany Marks throughout most of the day and evening with activities of daily living (ADLs), toileting, and ensuring food security. Additionally, a neighbor is staying overnight to provide supervision and assistance as needed.  Brittany Marks's daughter is actively involved in planning for Brittany Marks's long-term care needs in light of her declining health. She will participate by phone during Brittany Marks's upcoming visit next week.

## 2024-03-18 NOTE — ED Triage Notes (Signed)
 Pt presents to the ED via EMS from home with complaints of increased weakness progressing throughout the week. Pts caregiver also mentioned that she noticed a slight change in the patients mentation.   Hx of COPD and Lung cancer Aox4  EMS Vitals O2 82% 4L patient was placed on non rebreather and stated that it made her uncomfortable and wanted to take it off.  HR 100 BP 113/60 CBG 133

## 2024-03-18 NOTE — ED Notes (Signed)
 RN placed pt on 5L of HFNC, pt came up to 96%

## 2024-03-18 NOTE — ED Provider Notes (Addendum)
 [place] EMERGENCY DEPARTMENT AT [facility] Provider Note   CSN: [id number] Arrival date & time: 03/18/24  1517     History  Chief Complaint  Patient presents with   Weakness    Brittany Marks is a 83 y.o. female.  Patient brought in from home via EMS.  Patient is normally on 4 L of oxygen  at all times her claims she was 82% on that.  Currently here on 5 L she is satting 98 percent.  Blood sugar was 133.  Patient has in-house home caregivers now a new person with her today noted that her activity seem to be way down and did not seem to be thinking very clearly are very alert.  And also seem to have significant weakness.  Apparently yesterday was up and about with her walker.  Patient's past medical history is significant for COPD and lung cancer.  Currently receiving treatments through the cancer center for lung CA by Dr. Liam Redhead.  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  Patient everyday smoker.  According to the cancer center notes.  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  Current therapy is palliative systemic chemotherapy with carboplatin  patient seen in the cancer center on May 21.,  Patient also receiving etoposide , Imfinzi  and Neulasta  support.       Home Medications Prior to Admission medications   Medication Sig Start Date End Date Taking? Authorizing Provider  acetaminophen  (TYLENOL ) 325 MG tablet Take 2 tablets (650 mg total) by mouth every 4 (four) hours as needed for mild pain (pain score 1-3) (or temp > 37.5 C (99.5 F)). 12/11/23  Yes [place], Eric J, DO  albuterol  (VENTOLIN  HFA) 108 (90 Base) MCG/ACT inhaler Inhale 2 puffs into  the lungs every 6 (six) hours as needed for wheezing or shortness of breath. 08/07/22  Yes Heilingoetter, Cassandra L, PA-C  Ascorbic Acid (VITAMIN C PO) Take 1 tablet by mouth daily.   Yes [provider]  aspirin  EC 81 MG tablet Take 1 tablet (81 mg total) by mouth daily. Swallow whole. 12/12/23  Yes [place], Eric J, DO  atorvastatin  (LIPITOR) 40 MG tablet Take 1 tablet (40 mg total) by mouth daily. 12/12/23  Yes [place], Eric J, DO  b complex vitamins tablet Take 1 tablet by mouth daily.   Yes [provider]  durvalumab  1,500 mg in sodium chloride  0.9 % 100 mL Inject 1,500 mg into the vein every 28 (twenty-eight) days.   Yes [provider]  gabapentin  (NEURONTIN ) 100 MG capsule Take 1 capsule (100 mg total) by mouth 2 (two) times daily. Patient taking differently: Take 100 mg by mouth See admin instructions. Take 100 mg by mouth in the morning and evening 12/03/23  Yes Marlene Simas, MD  ibuprofen (ADVIL) 600 MG tablet Take 600 mg by mouth 2 (two) times daily as needed. 03/17/24  Yes [provider]  Multiple Vitamin (MULTIVITAMIN) tablet Take 1 tablet by mouth daily with breakfast.   Yes [provider]  nicotine  (NICODERM CQ  - DOSED IN MG/24 HOURS) 14 mg/24hr patch Place 14 mg onto the skin daily as needed (for the urge to smoke while hospitalized).   Yes  [provider]  senna-docusate (SENOKOT-S) 8.6-50 MG tablet Take 1 tablet by mouth at bedtime as needed for mild constipation. 12/11/23  Yes [place], Eric J, DO  ondansetron  (ZOFRAN ) 4 MG tablet Take 1 tablet (4 mg total) by mouth every 8 (eight) hours as needed for nausea or vomiting. 03/11/20   Shuford, Sherrlyn Dolores, PA-C  oxymetazoline (AFRIN) 0.05 % nasal spray Place 2 sprays into both nostrils 2 (two) times daily as needed (for a bloody nose).    [provider]      Allergies    Oxycodone     Review of Systems   Review of Systems  Unable to perform ROS: Mental status change     Physical Exam Updated Vital Signs BP 130/88   Pulse (!) 110   Temp 99.5 F (37.5 C) (Oral)   Resp (!) 24   Ht 1.575 m (5' 2)   Wt 60.2 kg   SpO2 (!) 87%   BMI 24.27 kg/m  Physical Exam Vitals and nursing note reviewed.  Constitutional:      General: She is not in acute distress.    Appearance: She is well-developed. She is ill-appearing.  HENT:     Head: Normocephalic and atraumatic.     Mouth/Throat:     Mouth: Mucous membranes are moist.  Eyes:     Extraocular Movements: Extraocular movements intact.     Conjunctiva/sclera: Conjunctivae normal.     Pupils: Pupils are equal, round, and reactive to light.  Cardiovascular:     Rate and Rhythm: Normal rate and regular rhythm.     Heart sounds: No murmur heard. Pulmonary:     Effort: Pulmonary effort is normal. No respiratory distress.     Breath sounds: Normal breath sounds.  Abdominal:     General: There is no distension.     Palpations: Abdomen is soft.     Tenderness: There is no abdominal tenderness.  Musculoskeletal:        General: No swelling.     Cervical back: Normal range of motion and neck supple. No rigidity.     Right lower leg: No edema.     Left lower leg: No edema.  Skin:    General: Skin is warm and dry.     Capillary Refill: Capillary refill takes less than 2 seconds.  Neurological:     General: No focal deficit present.     Mental Status: She is alert and oriented to person, place, and time.  Psychiatric:        Mood and Affect: Mood normal.     ED Results / Procedures / Treatments   Labs (all labs ordered are listed, but only abnormal results are displayed) Labs Reviewed  CBC WITH DIFFERENTIAL/PLATELET - Abnormal; Notable for the following components:      Result Value   WBC 18.5 (*)    RBC 3.33 (*)    Hemoglobin 10.7 (*)    HCT 32.3 (*)    Neutro Abs 16.4 (*)    Abs Immature Granulocytes 0.14 (*)    All other components within normal limits  COMPREHENSIVE METABOLIC PANEL WITH  GFR - Abnormal; Notable for the following components:   Sodium 133 (*)    Potassium 3.1 (*)    CO2 21 (*)    Glucose, Bld 104 (*)    BUN 28 (*)    Albumin 3.3 (*)    GFR, Estimated 59 (*)    All other components within normal limits  BLOOD GAS, VENOUS -  Abnormal; Notable for the following components:   pCO2, Ven 33 (*)    pO2, Ven 52 (*)    Bicarbonate 19.5 (*)    Acid-base deficit 4.7 (*)    All other components within normal limits  TROPONIN I (HIGH SENSITIVITY) - Abnormal; Notable for the following components:   Troponin I (High Sensitivity) 489 (*)    All other components within normal limits  TROPONIN I (HIGH SENSITIVITY) - Abnormal; Notable for the following components:   Troponin I (High Sensitivity) 546 (*)    All other components within normal limits  CULTURE, BLOOD (ROUTINE X 2)  CULTURE, BLOOD (ROUTINE X 2)  LIPASE, BLOOD  URINALYSIS, ROUTINE W REFLEX MICROSCOPIC  HEPARIN  LEVEL (UNFRACTIONATED)  CBC  I-STAT CG4 LACTIC ACID, ED  TROPONIN I (HIGH SENSITIVITY)    EKG EKG Interpretation Date/Time:  Wednesday March 18 2024 16:10:37 EDT Ventricular Rate:  103 PR Interval:  156 QRS Duration:  122 QT Interval:  334 QTC Calculation: 438 R Axis:   15  Text Interpretation: Sinus tachycardia Nonspecific intraventricular conduction delay Confirmed by Evalette Montrose [phone]) on 03/18/2024 4:17:56 PM  Radiology CT Angio Chest PE W/Cm &/Or Wo Cm Result Date: 03/18/2024 CLINICAL DATA:  Increased weakness progressing through the week. Slight change in mentation. History of COPD and lung cancer. EXAM: CT ANGIOGRAPHY CHEST WITH CONTRAST TECHNIQUE: Multidetector CT imaging of the chest was performed using the standard protocol during bolus administration of intravenous contrast. Multiplanar CT image reconstructions and MIPs were obtained to evaluate the vascular anatomy. RADIATION DOSE REDUCTION: This exam was performed according to the departmental dose-optimization program which  includes automated exposure control, adjustment of the mA and/or kV according to patient size and/or use of iterative reconstruction technique. CONTRAST:  75mL OMNIPAQUE  IOHEXOL  350 MG/ML SOLN COMPARISON:  Same day chest radiograph and CT 03/13/2024 FINDINGS: Cardiovascular: Cardiomegaly. No pericardial effusion. Negative for acute pulmonary embolism. Coronary artery and aortic atherosclerotic calcification. Normal caliber thoracic aorta. Mediastinum/Nodes: Small hiatal hernia. Trachea is unremarkable. Increased soft tissue thickening about the left hilum now measuring 4.5 x 3.2 cm, previously 3.3 x 2.9 cm. Increased size of a pretracheal node on series 10/image 86 now measuring 1.1 cm, previously 0.7 cm. New right infrahilar lymph node measuring 1.9 cm (series 10/image 156). Lungs/Pleura: Similar pulmonary fibrosis including peripheral interstitial and reticular opacities, traction bronchiectasis, and honeycombing at the lung bases. There is superimposed interstitial and airspace opacities particularly in the left greater than right lower lobes. Trace left pleural effusion. No pneumothorax. The subpleural mass along the anterolateral left upper lobe measures 3.2 x 3.6 cm. Using similar measuring technique this measured 3.1 x 2.8 cm on 03/13/2024. The adjacent left upper lobe nodule on 4/33 is unchanged and again measures 1.2 cm. Upper Abdomen: No acute abnormality. Musculoskeletal: Left reverse TSA. Advanced arthritis right shoulder. No acute fracture. No destructive osseous lesion. Review of the MIP images confirms the above findings. IMPRESSION: 1. Negative for acute pulmonary embolism. 2. Similar pulmonary fibrosis with superimposed interstitial and airspace opacities particularly in the left greater than right lower lobes. Favor superimposed pneumonia. 3. Slightly increased size of the subpleural mass along the anterolateral left upper lobe, left infrahilar mass, and mediastinal lymphadenopathy since  03/13/2024. Given short interval increase this may be related to pneumonia. Rapidly progressive malignancy/metastatic disease is not excluded. 4. Aortic Atherosclerosis (ICD10-I70.0). Electronically Signed   By: Rozell Cornet M.D.   On: 03/18/2024 23:00   CT Head Wo Contrast Result Date: 03/18/2024 CLINICAL DATA:  Mental  status change, unknown cause EXAM: CT HEAD WITHOUT CONTRAST TECHNIQUE: Contiguous axial images were obtained from the base of the skull through the vertex without intravenous contrast. RADIATION DOSE REDUCTION: This exam was performed according to the departmental dose-optimization program which includes automated exposure control, adjustment of the mA and/or kV according to patient size and/or use of iterative reconstruction technique. COMPARISON:  December 07, 2023 FINDINGS: Brain: The ventricles appear age appropriate. No mass effect or midline shift. Gray-white differentiation is preserved.Confluent periventricular and subcortical white matter hypoattenuation, most consistent with changes of severe chronic ischemic microvascular disease.No evidence of acute territorial infarction, extra-axial fluid collection, hemorrhage, or mass lesion. Remote lacunar infarct within the left basal ganglia. The basilar cisterns are patent without downward herniation. The cerebellar hemispheres and vermis are well formed without mass lesion or focal attenuation abnormality. Vascular: No hyperdense vessel. Calcified atherosclerotic plaque within the cavernous/supraclinoid ICA and intradural vertebral arteries. Skull: The skull base is especially degraded by patient motion. Negative for fracture or focal lesion. Sinuses/Orbits: Degraded by patient motion. The paranasal sinuses and mastoids are clear.No retrobulbar hematoma. Other: None. IMPRESSION: 1. No acute intracranial abnormality, specifically, no acute hemorrhage, territorial infarction, or intracranial mass. 2. Similar cerebral volume loss with sequelae of  advanced chronic ischemic microvascular disease. Electronically Signed   By: Rance Burrows M.D.   On: 03/18/2024 18:48   DG Chest Port 1 View Result Date: 03/18/2024 CLINICAL DATA:  Shortness of breath, weakness. EXAM: PORTABLE CHEST 1 VIEW COMPARISON:  December 26, 2018. FINDINGS: Stable cardiomediastinal silhouette. Status post left shoulder arthroplasty. Right internal jugular Port-A-Cath is noted with distal tip in expected position of cavoatrial junction. Mild diffuse reticular densities are noted throughout both lungs concerning for edema or atypical inflammation. Small left pleural effusion is noted. IMPRESSION: Mild diffuse reticular densities are noted throughout both lungs concerning for edema or atypical inflammation. Small left pleural effusion is noted. Electronically Signed   By: Rosalene Colon M.D.   On: 03/18/2024 16:43    Procedures Procedures    Medications Ordered in ED Medications  heparin  ADULT infusion 100 units/mL (25000 units/250mL) (700 Units/hr Intravenous New Bag/Given 03/18/24 2013)  sodium chloride  0.9 % bolus 500 mL (0 mLs Intravenous Stopped 03/18/24 1852)  potassium chloride  10 mEq in 100 mL IVPB (0 mEq Intravenous Stopped 03/18/24 2136)  heparin  bolus via infusion 4,000 Units (4,000 Units Intravenous Bolus from Bag 03/18/24 2014)  iohexol  (OMNIPAQUE ) 350 MG/ML injection 75 mL (75 mLs Intravenous Contrast Given 03/18/24 2218)    ED Course/ Medical Decision Making/ A&P                                 Medical Decision Making Amount and/or Complexity of Data Reviewed Labs: ordered. Radiology: ordered.  Risk Prescription drug management. Decision regarding hospitalization.   I will check venous blood gas.  Will get blood cultures and lactic acid even though vital signs are consistent with sepsis.  Will also check troponin.  And check CBC complete metabolic panel lipase urinalysis portable chest x-ray CT head.  To try to figure out what they generalized weakness  and the altered mental status.  CBC white count 18.5.  Fairly elevated but patient does receive Lunesta with her treatments.  Could be secondary to that.  Hemoglobin 10.7 platelets 312.  Differential all elevated absolute immature granulocytes.  Complete metabolic panel potassium down a little bit at 3.1 CO2 21 glucose 104 sodium down  a little bit at 133.  Albumin 3.3 GFR 59 LFTs normal anion gap normal.  Lipase normal.  Venous blood gas was reassuring with a pH 7.38 and pCO2 was 33.  So no retention.  Initial lactic acid was reassuring at 1.  CT head was negative portable chest x-ray raise some concerns for mild diffuse reticular densities throughout both lungs.  She does have a history of the lung CA.  Patient's troponin came back markedly elevated at 489.  Based on this we will start heparin .  Will get CT angio chest to rule out PE as well.  Clearly patient will require admission.  We still have the altered mental status so probably would be a medical admission.  The patient was given 500 cc of normal saline.  Will start a saline drip.  CRITICAL CARE Performed by: Hydia Copelin Total critical care time: 45 minutes Critical care time was exclusive of separately billable procedures and treating other patients. Critical care was necessary to treat or prevent imminent or life-threatening deterioration. Critical care was time spent personally by me on the following activities: development of treatment plan with patient and/or surrogate as well as nursing, discussions with consultants, evaluation of patient's response to treatment, examination of patient, obtaining history from patient or surrogate, ordering and performing treatments and interventions, ordering and review of laboratory studies, ordering and review of radiographic studies, pulse oximetry and re-evaluation of patient's condition.   CT angio chest without evidence of any pulmonary embolus.  I do think that there is evidence of  superimposed pneumonia left greater than right lower lobes.  Slightly increased size of the subpleural mass along the anterior lateral left upper lobe (5 hilar mass and mediastinal lymphadenopathy since June 6.  Given the short interval increase this may be related to pneumonia.  Rapidly progressing malignancy not excluded.  Patient is normally on 4 L of oxygen .  Patient currently on 4 L of oxygen  now.  Left patient's mental status still was not ideal.  Will start her on Rocephin and Zithromax.  Contact hospitalist for admission.  Already discussed with the on-call cardiologist.  Did not want patient transferred to [facility].  Just recommended doing serial troponins.  Patient's troponin is elevated.  They stated she is not a candidate for Cath Lab.  I have already started heparin .  Patient's initial troponin was 480 9 repeat troponin was 546.  This is not a significant increase but is going up.  As stated patient was started on heparin   CRITICAL CARE Performed by: Aniket Paye Total critical care time: 45 minutes Critical care time was exclusive of separately billable procedures and treating other patients. Critical care was necessary to treat or prevent imminent or life-threatening deterioration. Critical care was time spent personally by me on the following activities: development of treatment plan with patient and/or surrogate as well as nursing, discussions with consultants, evaluation of patient's response to treatment, examination of patient, obtaining history from patient or surrogate, ordering and performing treatments and interventions, ordering and review of laboratory studies, ordering and review of radiographic studies, pulse oximetry and re-evaluation of patient's condition.  Noted on CT head since rest of workup no evidence of pulmonary embolus.  Discussed with hospitalist they will admit.  Also chart review shows that patient is a DO NOT INTUBATE no CPR.  But does want some limited  interventions.  Hospitalist is Dr. Andy Bannister.   Final Clinical Impression(s) / ED Diagnoses Final diagnoses:  Generalized weakness  Altered mental status, unspecified altered  mental status type  Elevated troponin  Non-STEMI (non-ST elevated myocardial infarction) (HCC)  Hypokalemia  Multifocal pneumonia    Rx / DC Orders ED Discharge Orders     None         Nicklas Barns, MD 03/18/24 1615    Nicklas Barns, MD 03/18/24 Coit Dasen, MD 03/18/24 2310    Nicklas Barns, MD 03/18/24 2316    Nicklas Barns, MD 03/18/24 2344

## 2024-03-19 ENCOUNTER — Telehealth: Payer: Self-pay | Admitting: Medical Oncology

## 2024-03-19 DIAGNOSIS — J189 Pneumonia, unspecified organism: Secondary | ICD-10-CM | POA: Diagnosis present

## 2024-03-19 DIAGNOSIS — I5022 Chronic systolic (congestive) heart failure: Secondary | ICD-10-CM | POA: Insufficient documentation

## 2024-03-19 DIAGNOSIS — E785 Hyperlipidemia, unspecified: Secondary | ICD-10-CM | POA: Insufficient documentation

## 2024-03-19 DIAGNOSIS — G9341 Metabolic encephalopathy: Secondary | ICD-10-CM | POA: Insufficient documentation

## 2024-03-19 DIAGNOSIS — J9621 Acute and chronic respiratory failure with hypoxia: Secondary | ICD-10-CM | POA: Insufficient documentation

## 2024-03-19 DIAGNOSIS — I2489 Other forms of acute ischemic heart disease: Secondary | ICD-10-CM | POA: Insufficient documentation

## 2024-03-19 DIAGNOSIS — N390 Urinary tract infection, site not specified: Secondary | ICD-10-CM | POA: Insufficient documentation

## 2024-03-19 LAB — COMPREHENSIVE METABOLIC PANEL WITH GFR
ALT: 16 U/L (ref 0–44)
AST: 19 U/L (ref 15–41)
Albumin: 2.7 g/dL — ABNORMAL LOW (ref 3.5–5.0)
Alkaline Phosphatase: 88 U/L (ref 38–126)
Anion gap: 11 (ref 5–15)
BUN: 24 mg/dL — ABNORMAL HIGH (ref 8–23)
CO2: 19 mmol/L — ABNORMAL LOW (ref 22–32)
Calcium: 8.7 mg/dL — ABNORMAL LOW (ref 8.9–10.3)
Chloride: 103 mmol/L (ref 98–111)
Creatinine, Ser: 0.81 mg/dL (ref 0.44–1.00)
GFR, Estimated: >60 mL/min (ref >60)
Glucose, Bld: 99 mg/dL (ref 70–99)
Potassium: 4 mmol/L (ref 3.5–5.1)
Sodium: 133 mmol/L — ABNORMAL LOW (ref 135–145)
Total Bilirubin: 1.1 mg/dL (ref 0.0–1.2)
Total Protein: 7 g/dL (ref 6.5–8.1)

## 2024-03-19 LAB — PROCALCITONIN: Procalcitonin: 0.36 ng/mL

## 2024-03-19 LAB — URINALYSIS, ROUTINE W REFLEX MICROSCOPIC
Bilirubin Urine: NEGATIVE
Glucose, UA: NEGATIVE mg/dL
Hgb urine dipstick: NEGATIVE
Ketones, ur: 5 mg/dL — AB
Nitrite: NEGATIVE
Protein, ur: 30 mg/dL — AB
Specific Gravity, Urine: 1.038 — ABNORMAL HIGH (ref 1.005–1.030)
pH: 6 (ref 5.0–8.0)

## 2024-03-19 LAB — RESPIRATORY PANEL BY PCR

## 2024-03-19 LAB — TROPONIN I (HIGH SENSITIVITY)
Troponin I (High Sensitivity): 502 ng/L (ref <18)
Troponin I (High Sensitivity): 504 ng/L (ref <18)

## 2024-03-19 LAB — BLOOD GAS, VENOUS
Acid-base deficit: 2.8 mmol/L — ABNORMAL HIGH (ref 0.0–2.0)
Bicarbonate: 21.9 mmol/L (ref 20.0–28.0)
O2 Saturation: 69.7 %
Patient temperature: 37
pCO2, Ven: 37 mmHg — ABNORMAL LOW (ref 44–60)
pH, Ven: 7.38 (ref 7.25–7.43)
pO2, Ven: 37 mmHg (ref 32–45)

## 2024-03-19 LAB — CBC
HCT: 29.4 % — ABNORMAL LOW (ref 36.0–46.0)
Hemoglobin: 9.8 g/dL — ABNORMAL LOW (ref 12.0–15.0)
MCH: 32.6 pg (ref 26.0–34.0)
MCHC: 33.3 g/dL (ref 30.0–36.0)
MCV: 97.7 fL (ref 80.0–100.0)
Platelets: 292 10*3/uL (ref 150–400)
RBC: 3.01 MIL/uL — ABNORMAL LOW (ref 3.87–5.11)
RDW: 13.1 % (ref 11.5–15.5)
WBC: 17.7 10*3/uL — ABNORMAL HIGH (ref 4.0–10.5)
nRBC: 0 % (ref 0.0–0.2)

## 2024-03-19 LAB — STREP PNEUMONIAE URINARY ANTIGEN: Strep Pneumo Urinary Antigen: NEGATIVE

## 2024-03-19 LAB — HEPARIN LEVEL (UNFRACTIONATED): Heparin Unfractionated: <0.1 [IU]/mL — ABNORMAL LOW (ref 0.30–0.70)

## 2024-03-19 LAB — MAGNESIUM: Magnesium: 1.8 mg/dL (ref 1.7–2.4)

## 2024-03-19 LAB — TSH: TSH: 1.678 u[IU]/mL (ref 0.350–4.500)

## 2024-03-19 LAB — PHOSPHORUS: Phosphorus: 2.7 mg/dL (ref 2.5–4.6)

## 2024-03-19 MED ORDER — IPRATROPIUM-ALBUTEROL 0.5-2.5 (3) MG/3ML IN SOLN: 3.0000 mL | RESPIRATORY_TRACT | Status: DC | PRN

## 2024-03-19 MED ORDER — ACETAMINOPHEN 325 MG PO TABS
650.0000 mg | ORAL_TABLET | Freq: Four times a day (QID) | ORAL | Status: DC | PRN
  Administered 2024-03-26: 650 mg via ORAL
  Filled 2024-03-19 (×2): qty 2

## 2024-03-19 MED ORDER — ASPIRIN 81 MG PO TBEC
81.0000 mg | DELAYED_RELEASE_TABLET | Freq: Every day | ORAL | Status: DC
  Administered 2024-03-19 – 2024-03-26 (×8): 81 mg via ORAL
  Filled 2024-03-19 (×8): qty 1

## 2024-03-19 MED ORDER — HEPARIN SODIUM (PORCINE) 5000 UNIT/ML IJ SOLN
5000.0000 [IU] | Freq: Three times a day (TID) | INTRAMUSCULAR | Status: DC
Start: 2024-03-19 — End: 2024-03-19

## 2024-03-19 MED ORDER — DEXAMETHASONE SODIUM PHOSPHATE 10 MG/ML IJ SOLN
8.0000 mg | Freq: Once | INTRAMUSCULAR | Status: AC
  Administered 2024-03-19: 8 mg via INTRAVENOUS
  Filled 2024-03-19: qty 1

## 2024-03-19 MED ORDER — HEPARIN BOLUS VIA INFUSION
2000.0000 [IU] | Freq: Once | INTRAVENOUS | Status: AC
  Administered 2024-03-19: 2000 [IU] via INTRAVENOUS
  Filled 2024-03-19: qty 2000

## 2024-03-19 MED ORDER — ACETAMINOPHEN 650 MG RE SUPP
650.0000 mg | Freq: Four times a day (QID) | RECTAL | Status: DC | PRN
Start: 2024-03-19 — End: 2024-03-26
  Administered 2024-03-25: 650 mg via RECTAL
  Filled 2024-03-19 (×2): qty 1

## 2024-03-19 MED ORDER — ATORVASTATIN CALCIUM 40 MG PO TABS
40.0000 mg | ORAL_TABLET | Freq: Every day | ORAL | Status: DC
  Administered 2024-03-19 – 2024-03-26 (×8): 40 mg via ORAL
  Filled 2024-03-19 (×8): qty 1

## 2024-03-19 MED ORDER — SODIUM CHLORIDE 0.9 % IV SOLN
INTRAVENOUS | Status: DC
Start: 2024-03-19 — End: 2024-03-19

## 2024-03-19 MED ORDER — SODIUM CHLORIDE 0.9 % IV SOLN
2.0000 g | Freq: Two times a day (BID) | INTRAVENOUS | Status: AC
  Administered 2024-03-19 – 2024-03-23 (×10): 2 g via INTRAVENOUS
  Filled 2024-03-19 (×10): qty 12.5

## 2024-03-19 MED ORDER — MORPHINE SULFATE (PF) 2 MG/ML IV SOLN: 1.0000 mg | INTRAVENOUS | Status: DC | PRN

## 2024-03-19 MED ORDER — SODIUM CHLORIDE 0.9 % IV SOLN: 100.0000 mg | Freq: Two times a day (BID) | INTRAVENOUS | Status: DC

## 2024-03-19 MED ORDER — ENOXAPARIN SODIUM 40 MG/0.4ML IJ SOSY
40.0000 mg | PREFILLED_SYRINGE | INTRAMUSCULAR | Status: DC
  Administered 2024-03-19 – 2024-03-25 (×7): 40 mg via SUBCUTANEOUS
  Filled 2024-03-19 (×7): qty 0.4

## 2024-03-19 MED ORDER — NICOTINE 14 MG/24HR TD PT24: 14.0000 mg | MEDICATED_PATCH | Freq: Every day | TRANSDERMAL | Status: DC | PRN

## 2024-03-19 MED ORDER — GUAIFENESIN ER 600 MG PO TB12: 600.0000 mg | ORAL_TABLET | Freq: Two times a day (BID) | ORAL | Status: DC | PRN

## 2024-03-19 NOTE — Progress Notes (Addendum)
 PROGRESS NOTE    Brittany Marks  ZOX:[id number] DOB: 1940/10/25 DOA: 03/18/2024 PCP: Vicente Graham, No     Brief Narrative:  Brittany Marks is a 83 y.o. 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   She presented to ED due to increase in weakness and lethargy for 1 week, was noted to be more confused from baseline.  Also had hypoxemia with saturation 82% on chronic home O2.  In the emergency department, found to have UTI, pneumonia as well as demand ischemia.  New events last 24 hours / Subjective: Patient seen and evaluated in the emergency department.  She was alert and oriented to self, place, year.  She became very angry with me at times during conversation, stated that she did not want to be here, wanted to go home.    Discussed with daughter over the phone. Says patient was at [facility] and reported a fever Monday, released home from [place] on Tuesday. Home health started Wednesday.   Assessment & Plan:    Principal Problem:   CAP (community acquired pneumonia) Active Problems:   Leukocytosis   Small cell lung cancer, right upper lobe (HCC)   Chemotherapy-induced neuropathy (HCC)   Tobacco abuse   Change in mental status   Acute on chronic hypoxic respiratory failure (HCC)   UTI (urinary tract infection)   Demand ischemia (HCC)   Acute metabolic encephalopathy   Hyperlipidemia   Community-acquired pneumonia - CTA chest revealed superimposed interstitial and airspace opacities left greater than right - Respiratory viral panel negative - Strep pneumo antigen negative - Procalcitonin 0.36 - Cefepime   Acute on chronic  hypoxic respiratory failure - Requires 4 L nasal cannula O2 at baseline.  Noted desaturation to 87% in the emergency department.  Currently on 5 L.  UTI, POA - Urine culture is pending - Cefepime  as above  Demand ischemia - Insetting of respiratory failure, pneumonia.  Case was discussed with cardiology by ED physician, not a candidate for cath at this time.  Troponin has been elevated but flat trend 489, 546, 504, 502  Acute metabolic encephalopathy - At baseline, lives at home alone with caregivers.  Encephalopathy in setting of infection and hypoxia.  Seems to be resolved at this point  Metastatic lung cancer - On palliative chemo - Followed by Dr. Liam Redhead - Palliative care consulted  Hyperlipidemia - Lipitor  Chemo induced neuropathy - Gabapentin  on hold due to encephalopathy on presentation  Chronic systolic heart failure - Stable  Recent fracture of right proximal humerus - Fracture Feb 2025 - Followed by Dr. Alfredo Ano      DVT prophylaxis: enoxaparin  (LOVENOX ) injection 40 mg Start: 03/19/24 2200 Code Status: DNR Family Communication: Updated daughter over the phone  Disposition Plan: Home Status is: Inpatient Remains inpatient appropriate because: IV antibiotics    Antimicrobials:  Anti-infectives (From admission, onward)    Start     Dose/Rate Route Frequency Ordered Stop   03/19/24 0800  ceFEPIme  (MAXIPIME ) 2 g in sodium chloride  0.9 % 100 mL IVPB        2 g 200 mL/hr over 30 Minutes Intravenous Every 12 hours 03/19/24 0617 03/24/24 0759   03/19/24 0630  doxycycline (VIBRAMYCIN) 100  mg in sodium chloride  0.9 % 250 mL IVPB  Status:  Discontinued        100 mg 125 mL/hr over 120 Minutes Intravenous Every 12 hours 03/19/24 0617 03/19/24 0801   03/18/24 2330  cefTRIAXone  (ROCEPHIN ) 1 g in sodium chloride  0.9 % 100 mL IVPB        1 g 200 mL/hr over 30 Minutes Intravenous  Once 03/18/24 2315 03/19/24 0014   03/18/24 2330  azithromycin  (ZITHROMAX ) 500 mg in  sodium chloride  0.9 % 250 mL IVPB        500 mg 250 mL/hr over 60 Minutes Intravenous  Once 03/18/24 2315 03/19/24 0148        Objective: Vitals:   03/19/24 0800 03/19/24 0815 03/19/24 1001 03/19/24 1100  BP: 121/65   (!) 127/51  Pulse: 90 88  90  Resp: (!) 34 (!) 28  (!) 28  Temp:   98.5 F (36.9 C)   TempSrc:   Oral   SpO2: 99% 97%  95%  Weight:      Height:       No intake or output data in the 24 hours ending 03/19/24 1402 Filed Weights   03/18/24 1548  Weight: 60.2 kg    Examination:  General exam: Appears calm  Respiratory system: Clear to auscultation anteriorly.  Respiratory effort is normal without distress.  On nasal cannula O2 Cardiovascular system: S1 & S2 heard, RRR. No murmurs. No pedal edema. Gastrointestinal system: Abdomen is nondistended, soft and nontender. Normal bowel sounds heard. Central nervous system: Alert and oriented. No focal neurological deficits.  Extremities: Symmetric in appearance  Skin: No rashes, lesions or ulcers on exposed skin   Data Reviewed: I have personally reviewed following labs and imaging studies  CBC: Recent Labs  Lab 03/18/24 1708 03/19/24 0402  WBC 18.5* 17.7*  NEUTROABS 16.4*  --   HGB 10.7* 9.8*  HCT 32.3* 29.4*  MCV 97.0 97.7  PLT 312 292   Basic Metabolic Panel: Recent Labs  Lab 03/18/24 1708 03/19/24 0402  NA 133* 133*  K 3.1* 4.0  CL 101 103  CO2 21* 19*  GLUCOSE 104* 99  BUN 28* 24*  CREATININE 0.96 0.81  CALCIUM  9.6 8.7*  MG  --  1.8  PHOS  --  2.7   GFR: Estimated Creatinine Clearance: 45.7 mL/min (by C-G formula based on SCr of 0.81 mg/dL). Liver Function Tests: Recent Labs  Lab 03/18/24 1708 03/19/24 0402  AST 22 19  ALT 19 16  ALKPHOS 97 88  BILITOT 1.2 1.1  PROT 7.5 7.0  ALBUMIN 3.3* 2.7*   Recent Labs  Lab 03/18/24 1708  LIPASE 28   No results for input(s): AMMONIA in the last 168 hours. Coagulation Profile: No results for input(s): INR, PROTIME in the last 168  hours. Cardiac Enzymes: No results for input(s): CKTOTAL, CKMB, CKMBINDEX, TROPONINI in the last 168 hours. BNP (last 3 results) No results for input(s): PROBNP in the last 8760 hours. HbA1C: No results for input(s): HGBA1C in the last 72 hours. CBG: No results for input(s): GLUCAP in the last 168 hours. Lipid Profile: No results for input(s): CHOL, HDL, LDLCALC, TRIG, CHOLHDL, LDLDIRECT in the last 72 hours. Thyroid  Function Tests: Recent Labs    03/19/24 0402  TSH 1.678   Anemia Panel: No results for input(s): VITAMINB12, FOLATE, FERRITIN, TIBC, IRON, RETICCTPCT in the last 72 hours. Sepsis Labs: Recent Labs  Lab 03/18/24 1707 03/19/24 0402  PROCALCITON  --  0.36  LATICACIDVEN  1.0  --     Recent Results (from the past 240 hours)  Culture, blood (Routine X 2) w Reflex to ID Panel     Status: None (Preliminary result)   Collection Time: 03/18/24  5:08 PM   Specimen: BLOOD  Result Value Ref Range Status   Specimen Description   Final    BLOOD SITE NOT SPECIFIED Performed at [facility], [address], [place], [place] [address]    Special Requests   Final    BOTTLES DRAWN AEROBIC AND ANAEROBIC Blood Culture adequate volume Performed at [facility], [address] [place], [place] [address]    Culture   Final    NO GROWTH < 12 HOURS Performed at [facility], [address], [place], [place] [address]    Report Status PENDING  Incomplete  Respiratory (~20 pathogens) panel by PCR     Status: None   Collection Time: 03/19/24  6:12 AM   Specimen: Urine, Clean Catch; Respiratory  Result Value Ref Range Status   Adenovirus NOT DETECTED NOT DETECTED Final   Coronavirus 229E NOT DETECTED NOT DETECTED Final    Comment: (NOTE) The Coronavirus on the Respiratory Panel, DOES NOT test for the novel  Coronavirus (2019 nCoV)    Coronavirus HKU1 NOT DETECTED NOT DETECTED Final   Coronavirus  NL63 NOT DETECTED NOT DETECTED Final   Coronavirus OC43 NOT DETECTED NOT DETECTED Final   Metapneumovirus NOT DETECTED NOT DETECTED Final   Rhinovirus / Enterovirus NOT DETECTED NOT DETECTED Final   Influenza A NOT DETECTED NOT DETECTED Final   Influenza B NOT DETECTED NOT DETECTED Final   Parainfluenza Virus 1 NOT DETECTED NOT DETECTED Final   Parainfluenza Virus 2 NOT DETECTED NOT DETECTED Final   Parainfluenza Virus 3 NOT DETECTED NOT DETECTED Final   Parainfluenza Virus 4 NOT DETECTED NOT DETECTED Final   Respiratory Syncytial Virus NOT DETECTED NOT DETECTED Final   Bordetella pertussis NOT DETECTED NOT DETECTED Final   Bordetella Parapertussis NOT DETECTED NOT DETECTED Final   Chlamydophila pneumoniae NOT DETECTED NOT DETECTED Final   Mycoplasma pneumoniae NOT DETECTED NOT DETECTED Final    Comment: Performed at [facility], [address] [place], [place] [address]      Radiology Studies: CT Angio Chest PE W/Cm &/Or Wo Cm Result Date: 03/18/2024 CLINICAL DATA:  Increased weakness progressing through the week. Slight change in mentation. History of COPD and lung cancer. EXAM: CT ANGIOGRAPHY CHEST WITH CONTRAST TECHNIQUE: Multidetector CT imaging of the chest was performed using the standard protocol during bolus administration of intravenous contrast. Multiplanar CT image reconstructions and MIPs were obtained to evaluate the vascular anatomy. RADIATION DOSE REDUCTION: This exam was performed according to the departmental dose-optimization program which includes automated exposure control, adjustment of the mA and/or kV according to patient size and/or use of iterative reconstruction technique. CONTRAST:  75mL OMNIPAQUE  IOHEXOL  350 MG/ML SOLN COMPARISON:  Same day chest radiograph and CT 03/13/2024 FINDINGS: Cardiovascular: Cardiomegaly. No pericardial effusion. Negative for acute pulmonary embolism. Coronary artery and aortic atherosclerotic calcification. Normal caliber thoracic  aorta. Mediastinum/Nodes: Small hiatal hernia. Trachea is unremarkable. Increased soft tissue thickening about the left hilum now measuring 4.5 x 3.2 cm, previously 3.3 x 2.9 cm. Increased size of a pretracheal node on series 10/image 86 now measuring 1.1 cm, previously 0.7 cm. New right infrahilar lymph node measuring 1.9 cm (series 10/image 156). Lungs/Pleura: Similar pulmonary fibrosis including peripheral interstitial and reticular opacities, traction  bronchiectasis, and honeycombing at the lung bases. There is superimposed interstitial and airspace opacities particularly in the left greater than right lower lobes. Trace left pleural effusion. No pneumothorax. The subpleural mass along the anterolateral left upper lobe measures 3.2 x 3.6 cm. Using similar measuring technique this measured 3.1 x 2.8 cm on 03/13/2024. The adjacent left upper lobe nodule on 4/33 is unchanged and again measures 1.2 cm. Upper Abdomen: No acute abnormality. Musculoskeletal: Left reverse TSA. Advanced arthritis right shoulder. No acute fracture. No destructive osseous lesion. Review of the MIP images confirms the above findings. IMPRESSION: 1. Negative for acute pulmonary embolism. 2. Similar pulmonary fibrosis with superimposed interstitial and airspace opacities particularly in the left greater than right lower lobes. Favor superimposed pneumonia. 3. Slightly increased size of the subpleural mass along the anterolateral left upper lobe, left infrahilar mass, and mediastinal lymphadenopathy since 03/13/2024. Given short interval increase this may be related to pneumonia. Rapidly progressive malignancy/metastatic disease is not excluded. 4. Aortic Atherosclerosis (ICD10-I70.0). Electronically Signed   By: Rozell Cornet M.D.   On: 03/18/2024 23:00   CT Head Wo Contrast Result Date: 03/18/2024 CLINICAL DATA:  Mental status change, unknown cause EXAM: CT HEAD WITHOUT CONTRAST TECHNIQUE: Contiguous axial images were obtained from the  base of the skull through the vertex without intravenous contrast. RADIATION DOSE REDUCTION: This exam was performed according to the departmental dose-optimization program which includes automated exposure control, adjustment of the mA and/or kV according to patient size and/or use of iterative reconstruction technique. COMPARISON:  December 07, 2023 FINDINGS: Brain: The ventricles appear age appropriate. No mass effect or midline shift. Gray-white differentiation is preserved.Confluent periventricular and subcortical white matter hypoattenuation, most consistent with changes of severe chronic ischemic microvascular disease.No evidence of acute territorial infarction, extra-axial fluid collection, hemorrhage, or mass lesion. Remote lacunar infarct within the left basal ganglia. The basilar cisterns are patent without downward herniation. The cerebellar hemispheres and vermis are well formed without mass lesion or focal attenuation abnormality. Vascular: No hyperdense vessel. Calcified atherosclerotic plaque within the cavernous/supraclinoid ICA and intradural vertebral arteries. Skull: The skull base is especially degraded by patient motion. Negative for fracture or focal lesion. Sinuses/Orbits: Degraded by patient motion. The paranasal sinuses and mastoids are clear.No retrobulbar hematoma. Other: None. IMPRESSION: 1. No acute intracranial abnormality, specifically, no acute hemorrhage, territorial infarction, or intracranial mass. 2. Similar cerebral volume loss with sequelae of advanced chronic ischemic microvascular disease. Electronically Signed   By: Rance Burrows M.D.   On: 03/18/2024 18:48   DG Chest Port 1 View Result Date: 03/18/2024 CLINICAL DATA:  Shortness of breath, weakness. EXAM: PORTABLE CHEST 1 VIEW COMPARISON:  December 26, 2018. FINDINGS: Stable cardiomediastinal silhouette. Status post left shoulder arthroplasty. Right internal jugular Port-A-Cath is noted with distal tip in expected position of  cavoatrial junction. Mild diffuse reticular densities are noted throughout both lungs concerning for edema or atypical inflammation. Small left pleural effusion is noted. IMPRESSION: Mild diffuse reticular densities are noted throughout both lungs concerning for edema or atypical inflammation. Small left pleural effusion is noted. Electronically Signed   By: Rosalene Colon M.D.   On: 03/18/2024 16:43      Scheduled Meds:  aspirin  EC  81 mg Oral Daily   atorvastatin   40 mg Oral Daily   enoxaparin  (LOVENOX ) injection  40 mg Subcutaneous Q24H   Continuous Infusions:  ceFEPime  (MAXIPIME ) IV Stopped (03/19/24 0856)     LOS: 1 day   Time spent: 35 minutes   Bridget[facility] Campus  Wendall Halls, DO [facility] 03/19/2024, 2:02 PM   Available via Epic secure chat 7am-7pm After these hours, please refer to coverage provider listed on [email or web address]

## 2024-03-19 NOTE — Progress Notes (Signed)
 PHARMACY - ANTICOAGULATION CONSULT NOTE  Pharmacy Consult for IV heparin  Indication: chest pain/ACS  Allergies  Allergen Reactions   Oxycodone  Other (See Comments)    Unsteady gait, fall, altered mental status    Patient Measurements: Height: 5' 2 (157.5 cm) Weight: 60.2 kg (132 lb 11.5 oz) IBW/kg (Calculated) : 50.1 HEPARIN  DW (KG): 60.2  Vital Signs: Temp: 99.1 F (37.3 C) (06/12 0156) Temp Source: Oral (06/12 0156) BP: 125/65 (06/12 0330) Pulse Rate: 92 (06/12 0330)  Labs: Recent Labs    03/18/24 1708 03/18/24 1851 03/18/24 2332 03/19/24 0141 03/19/24 0402  HGB 10.7*  --   --   --  9.8*  HCT 32.3*  --   --   --  29.4*  PLT 312  --   --   --  292  HEPARINUNFRC  --   --   --   --  <0.10*  CREATININE 0.96  --   --   --   --   TROPONINIHS 489* 546* 504* 502*  --     Estimated Creatinine Clearance: 38.6 mL/min (by C-G formula based on SCr of 0.96 mg/dL).   Medical History: Past Medical History:  Diagnosis Date   Acute CVA (cerebrovascular accident) (HCC) 12/07/2023   Cataract    Bilateral   Constipation    pain medication   Left rotator cuff tear    Right rotator cuff tear    SCL CA dx'd 11/2018    Medications:  Scheduled:  Infusions:   heparin  700 Units/hr (03/19/24 0431)    Assessment: Patient presented with weakness found to have possible ACS to start IV heparin . Elevated troponin's. Baseline labs drawn. Not on any anticoagulation meds prior to admission.   03/19/2024 HL < 0.1 subtherapeutic on 700 units/hr Hgb 9.8, plts 292, trop 502 Per RN no interruptions or bleeding   Goal of Therapy:  Heparin  level 0.3-0.7 units/ml Monitor platelets by anticoagulation protocol: Yes   Plan:  IV heparin  2000 unit bolus then Increase heparin  drip to 900 units/hr Check heparin  level in 8 hours Daily CBC   Beau Bound RPh 03/19/2024, 4:46 AM

## 2024-03-19 NOTE — ED Notes (Signed)
 Pt needs assistance with eating d/t arm mobility limitations

## 2024-03-19 NOTE — Plan of Care (Signed)
  Problem: Health Behavior/Discharge Planning: Goal: Ability to manage health-related needs will improve Outcome: Progressing   Problem: Clinical Measurements: Goal: Ability to maintain clinical measurements within normal limits will improve Outcome: Progressing   Problem: Clinical Measurements: Goal: Diagnostic test results will improve Outcome: Progressing   

## 2024-03-19 NOTE — ED Notes (Signed)
 Gave pt's daughter brief update

## 2024-03-19 NOTE — Telephone Encounter (Signed)
-  Money is in the ED via EMS transport yesterday .  Caregiver reported that Brittany Marks had  increased weakness progressing throughout the week and also mentioned that she noticed a slight change in the patients mentation.   Has UTI, pneumonia ,as well as NSTEMI presumed type II, as well as progression of metastatic disease   F/u med on appt Monday.Mohamed notified.

## 2024-03-20 DIAGNOSIS — J189 Pneumonia, unspecified organism: Secondary | ICD-10-CM | POA: Diagnosis not present

## 2024-03-20 LAB — CBC WITH DIFFERENTIAL/PLATELET
Abs Immature Granulocytes: 0.12 10*3/uL — ABNORMAL HIGH (ref 0.00–0.07)
Basophils Absolute: 0.1 10*3/uL (ref 0.0–0.1)
Basophils Relative: 0 %
Eosinophils Absolute: 0.5 10*3/uL (ref 0.0–0.5)
Eosinophils Relative: 3 %
HCT: 27.7 % — ABNORMAL LOW (ref 36.0–46.0)
Hemoglobin: 8.8 g/dL — ABNORMAL LOW (ref 12.0–15.0)
Immature Granulocytes: 1 %
Lymphocytes Relative: 8 %
Lymphs Abs: 1.1 10*3/uL (ref 0.7–4.0)
MCH: 31.3 pg (ref 26.0–34.0)
MCHC: 31.8 g/dL (ref 30.0–36.0)
MCV: 98.6 fL (ref 80.0–100.0)
Monocytes Absolute: 1.5 10*3/uL — ABNORMAL HIGH (ref 0.1–1.0)
Monocytes Relative: 10 %
Neutro Abs: 11.3 10*3/uL — ABNORMAL HIGH (ref 1.7–7.7)
Neutrophils Relative %: 78 %
Platelets: 299 10*3/uL (ref 150–400)
RBC: 2.81 MIL/uL — ABNORMAL LOW (ref 3.87–5.11)
RDW: 13.2 % (ref 11.5–15.5)
WBC: 14.5 10*3/uL — ABNORMAL HIGH (ref 4.0–10.5)
nRBC: 0 % (ref 0.0–0.2)

## 2024-03-20 LAB — COMPREHENSIVE METABOLIC PANEL WITH GFR
ALT: 17 U/L (ref 0–44)
AST: 19 U/L (ref 15–41)
Albumin: 2.8 g/dL — ABNORMAL LOW (ref 3.5–5.0)
Alkaline Phosphatase: 95 U/L (ref 38–126)
Anion gap: 12 (ref 5–15)
BUN: 20 mg/dL (ref 8–23)
CO2: 19 mmol/L — ABNORMAL LOW (ref 22–32)
Calcium: 8.4 mg/dL — ABNORMAL LOW (ref 8.9–10.3)
Chloride: 102 mmol/L (ref 98–111)
Creatinine, Ser: 0.84 mg/dL (ref 0.44–1.00)
GFR, Estimated: >60 mL/min (ref >60)
Glucose, Bld: 103 mg/dL — ABNORMAL HIGH (ref 70–99)
Potassium: 3.4 mmol/L — ABNORMAL LOW (ref 3.5–5.1)
Sodium: 133 mmol/L — ABNORMAL LOW (ref 135–145)
Total Bilirubin: 1 mg/dL (ref 0.0–1.2)
Total Protein: 6.6 g/dL (ref 6.5–8.1)

## 2024-03-20 LAB — HEMOGLOBIN A1C
Hgb A1c MFr Bld: 5.4 % (ref 4.8–5.6)
Mean Plasma Glucose: 108 mg/dL

## 2024-03-20 MED ORDER — FUROSEMIDE 10 MG/ML IJ SOLN
40.0000 mg | Freq: Once | INTRAMUSCULAR | Status: AC
  Administered 2024-03-20: 40 mg via INTRAVENOUS
  Filled 2024-03-20: qty 4

## 2024-03-20 MED ORDER — POTASSIUM CHLORIDE CRYS ER 20 MEQ PO TBCR
40.0000 meq | EXTENDED_RELEASE_TABLET | Freq: Once | ORAL | Status: AC
  Administered 2024-03-20: 40 meq via ORAL
  Filled 2024-03-20: qty 2

## 2024-03-20 MED ORDER — PNEUMOCOCCAL 20-VAL CONJ VACC 0.5 ML IM SUSY
0.5000 mL | PREFILLED_SYRINGE | INTRAMUSCULAR | Status: AC
  Administered 2024-03-21: 0.5 mL via INTRAMUSCULAR
  Filled 2024-03-20: qty 0.5

## 2024-03-20 NOTE — Progress Notes (Signed)
 PT Cancellation Note  Patient Details Name: Brittany Marks MRN: [id number] DOB: 01-02-1941   Cancelled Treatment:    Reason Eval/Treat Not Completed: Patient declined, no reason specified Pt adamantly refused despite encouragement and motivation provided.   Myna Asal Payson 03/20/2024, 11:29 AM Blanch Bunde, DPT Physical Therapist Acute Rehabilitation Services Office: [phone]

## 2024-03-20 NOTE — Consult Note (Signed)
 Palliative Care  Consultation Note  83 yo admitted with acute respiratory failure and altered mental status in setting of UTI and CAP, recently discharged from [facility], under active treatment for metastatic small cell lung cancer with Dr. Liam Redhead.   Patient was extremely dyspneic on my exam today, audible wheezing and moderate distress.  She was not able to participate in a detailed conversation or provide history. Noted daughter is involved with her care and had been updated this AM by primary service.  Will focus on symptom management and treatment of infection first and when able address her goals of care and expectations.  Morphine  1-2mg  q2 for Tachypnea and Respirations >25/Distress Decadron  8mg  X1 dose Scheduled Bronchodilators Abx per primary Service Oncology treatment plan TBD Will need to address functional status decline and frailty.  Flora Humphreys, DO Palliative Medicine  Time: 55 min

## 2024-03-20 NOTE — Plan of Care (Signed)
  Problem: Education: Goal: Knowledge of General Education information will improve Description: Including pain rating scale, medication(s)/side effects and non-pharmacologic comfort measures Outcome: Progressing   Problem: Health Behavior/Discharge Planning: Goal: Ability to manage health-related needs will improve Outcome: Progressing   Problem: Clinical Measurements: Goal: Ability to maintain clinical measurements within normal limits will improve Outcome: Progressing Goal: Will remain free from infection Outcome: Progressing Goal: Diagnostic test results will improve Outcome: Progressing Goal: Respiratory complications will improve Outcome: Progressing Goal: Cardiovascular complication will be avoided Outcome: Progressing   Problem: Activity: Goal: Risk for activity intolerance will decrease Outcome: Progressing   Problem: Nutrition: Goal: Adequate nutrition will be maintained Outcome: Progressing   Problem: Coping: Goal: Level of anxiety will decrease Outcome: Progressing   Problem: Elimination: Goal: Will not experience complications related to bowel motility Outcome: Progressing Goal: Will not experience complications related to urinary retention Outcome: Progressing   Problem: Pain Managment: Goal: General experience of comfort will improve and/or be controlled Outcome: Progressing   Problem: Safety: Goal: Ability to remain free from injury will improve Outcome: Progressing   Problem: Skin Integrity: Goal: Risk for impaired skin integrity will decrease Outcome: Progressing   Problem: Activity: Goal: Ability to tolerate increased activity will improve Outcome: Progressing   Problem: Clinical Measurements: Goal: Ability to maintain a body temperature in the normal range will improve Outcome: Progressing   Problem: Respiratory: Goal: Ability to maintain adequate ventilation will improve Outcome: Progressing Goal: Ability to maintain a clear airway  will improve Outcome: Progressing   Problem: Education: Goal: Knowledge of the prescribed therapeutic regimen will improve Outcome: Progressing   Problem: Bowel/Gastric: Goal: Will not experience complications related to bowel motility Outcome: Progressing   Problem: Coping: Goal: Ability to identify and develop effective coping behavior will improve Outcome: Progressing   Problem: Nutritional: Goal: Maintenance of adequate nutrition will improve Outcome: Progressing

## 2024-03-20 NOTE — Progress Notes (Signed)
 PROGRESS NOTE    EARNEST THALMAN  WUJ:[id number] DOB: September 14, 1941 DOA: 03/18/2024 PCP: Vicente Graham, No     Brief Narrative:  Brittany Marks is a 83 y.o. 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   She presented to ED due to increase in weakness and lethargy for 1 week, was noted to be more confused from baseline.  Also had hypoxemia with saturation 82% on chronic home O2.  In the emergency department, found to have UTI, pneumonia as well as demand ischemia.  New events last 24 hours / Subjective: Patient without any new complaints.  States that she did not eat breakfast, did not want any.  Appears pretty weak, declined to work with PT.    Assessment & Plan: Principal Problem:   CAP (community acquired pneumonia) Active Problems:   Leukocytosis   Small cell lung cancer, right upper lobe (HCC)   Chemotherapy-induced neuropathy (HCC)   Tobacco abuse   Change in mental status   Acute on chronic hypoxic respiratory failure (HCC)   UTI (urinary tract infection)   Demand ischemia (HCC)   Acute metabolic encephalopathy   Hyperlipidemia   Chronic systolic CHF (congestive heart failure) (HCC)   Community-acquired pneumonia - CTA chest revealed superimposed interstitial and airspace opacities left greater than right - Respiratory viral panel negative - Strep pneumo antigen negative - Procalcitonin 0.36 - Cefepime   Acute on chronic hypoxic respiratory failure - Requires 4 L nasal cannula O2 at baseline.  Noted desaturation to 87% in the emergency department.  Currently on 5 L.   UTI, POA - Urine culture is pending - Cefepime  as  above  Demand ischemia - Insetting of respiratory failure, pneumonia.  Case was discussed with cardiology by ED physician, not a candidate for cath at this time.  Troponin has been elevated but flat trend 489, 546, 504, 502  Acute metabolic encephalopathy - At baseline, lives at home alone with caregivers.  Encephalopathy in setting of infection and hypoxia.  Seems to be resolved at this point  Metastatic lung cancer - On palliative chemo - Followed by Dr. Liam Redhead - Palliative care consulted  Hyperlipidemia - Lipitor  Chemo induced neuropathy - Gabapentin  on hold due to encephalopathy on presentation  Chronic systolic heart failure - Lasix ordered today   Recent fracture of right proximal humerus - Fracture Feb 2025 - Followed by Dr. Alfredo Ano    Hypokalemia - Replace    DVT prophylaxis: enoxaparin  (LOVENOX ) injection 40 mg Start: 03/19/24 2200 Code Status: DNR Family Communication: Updated daughter over the phone 6/13  Disposition Plan: Home Status is: Inpatient Remains inpatient appropriate because: IV antibiotics    Antimicrobials:  Anti-infectives (From admission, onward)    Start     Dose/Rate Route Frequency Ordered Stop   03/19/24 0800  ceFEPIme  (MAXIPIME ) 2 g in sodium chloride  0.9 % 100 mL IVPB        2 g 200 mL/hr over 30 Minutes Intravenous Every 12 hours 03/19/24 0617 03/24/24 0759   03/19/24 0630  doxycycline (VIBRAMYCIN) 100 mg in sodium chloride  0.9 % 250 mL IVPB  Status:  Discontinued        100 mg 125 mL/hr over 120 Minutes Intravenous Every 12  hours 03/19/24 0617 03/19/24 0801   03/18/24 2330  cefTRIAXone  (ROCEPHIN ) 1 g in sodium chloride  0.9 % 100 mL IVPB        1 g 200 mL/hr over 30 Minutes Intravenous  Once 03/18/24 2315 03/19/24 0014   03/18/24 2330  azithromycin  (ZITHROMAX ) 500 mg in sodium chloride  0.9 % 250 mL IVPB        500 mg 250 mL/hr over 60 Minutes Intravenous  Once 03/18/24 2315 03/19/24 0148        Objective: Vitals:    03/20/24 0043 03/20/24 0452 03/20/24 1226 03/20/24 1229  BP: 135/79 (!) 145/71 (!) 154/79 137/76  Pulse: 98 91 91 92  Resp: 18 16 17    Temp: 98 F (36.7 C) 98.3 F (36.8 C) 98.5 F (36.9 C)   TempSrc:  Oral Oral   SpO2: 94% 97% 96% 95%  Weight:      Height:        Intake/Output Summary (Last 24 hours) at 03/20/2024 1249 Last data filed at 03/20/2024 4098 Gross per 24 hour  Intake 200 ml  Output 0 ml  Net 200 ml   Filed Weights   03/18/24 1548  Weight: 60.2 kg    Examination:  General exam: Appears calm, weak  Respiratory system: Clear to auscultation anteriorly. On nasal cannula O2, mildly tachypneic today  Cardiovascular system: S1 & S2 heard, RRR. No murmurs. No pedal edema. Gastrointestinal system: Abdomen is nondistended, soft and nontender. Normal bowel sounds heard. Central nervous system: Alert and oriented. No focal neurological deficits.  Extremities: Symmetric in appearance   Data Reviewed: I have personally reviewed following labs and imaging studies  CBC: Recent Labs  Lab 03/18/24 1708 03/19/24 0402 03/20/24 0401  WBC 18.5* 17.7* 14.5*  NEUTROABS 16.4*  --  11.3*  HGB 10.7* 9.8* 8.8*  HCT 32.3* 29.4* 27.7*  MCV 97.0 97.7 98.6  PLT 312 292 299   Basic Metabolic Panel: Recent Labs  Lab 03/18/24 1708 03/19/24 0402 03/20/24 0401  NA 133* 133* 133*  K 3.1* 4.0 3.4*  CL 101 103 102  CO2 21* 19* 19*  GLUCOSE 104* 99 103*  BUN 28* 24* 20  CREATININE 0.96 0.81 0.84  CALCIUM  9.6 8.7* 8.4*  MG  --  1.8  --   PHOS  --  2.7  --    GFR: Estimated Creatinine Clearance: 44.1 mL/min (by C-G formula based on SCr of 0.84 mg/dL). Liver Function Tests: Recent Labs  Lab 03/18/24 1708 03/19/24 0402 03/20/24 0401  AST 22 19 19   ALT 19 16 17   ALKPHOS 97 88 95  BILITOT 1.2 1.1 1.0  PROT 7.5 7.0 6.6  ALBUMIN 3.3* 2.7* 2.8*   Recent Labs  Lab 03/18/24 1708  LIPASE 28   No results for input(s): AMMONIA in the last 168 hours. Coagulation  Profile: No results for input(s): INR, PROTIME in the last 168 hours. Cardiac Enzymes: No results for input(s): CKTOTAL, CKMB, CKMBINDEX, TROPONINI in the last 168 hours. BNP (last 3 results) No results for input(s): PROBNP in the last 8760 hours. HbA1C: Recent Labs    03/19/24 0402  HGBA1C 5.4   CBG: No results for input(s): GLUCAP in the last 168 hours. Lipid Profile: No results for input(s): CHOL, HDL, LDLCALC, TRIG, CHOLHDL, LDLDIRECT in the last 72 hours. Thyroid  Function Tests: Recent Labs    03/19/24 0402  TSH 1.678   Anemia Panel: No results for input(s): VITAMINB12, FOLATE, FERRITIN, TIBC, IRON, RETICCTPCT in the last 72 hours. Sepsis Labs:  Recent Labs  Lab 03/18/24 1707 03/19/24 0402  PROCALCITON  --  0.36  LATICACIDVEN 1.0  --     Recent Results (from the past 240 hours)  Culture, blood (Routine X 2) w Reflex to ID Panel     Status: None (Preliminary result)   Collection Time: 03/18/24  5:08 PM   Specimen: BLOOD  Result Value Ref Range Status   Specimen Description   Final    BLOOD SITE NOT SPECIFIED Performed at [facility], [address] [place], [place] [address]    Special Requests   Final    BOTTLES DRAWN AEROBIC AND ANAEROBIC Blood Culture adequate volume Performed at [facility], [address] [place], [place] [address]    Culture   Final    NO GROWTH 2 DAYS Performed at [facility], [address], [place], [place] [address]    Report Status PENDING  Incomplete  Urine Culture (for pregnant, neutropenic or urologic patients or patients with an indwelling urinary catheter)     Status: None (Preliminary result)   Collection Time: 03/19/24  2:24 AM   Specimen: Urine, Clean Catch  Result Value Ref Range Status   Specimen Description   Final    URINE, CLEAN CATCH Performed at [facility], [address] [place], [place] [address]     Special Requests   Final    NONE Performed at [facility], [address], [place], [place] [address]    Culture   Final    CULTURE REINCUBATED FOR BETTER GROWTH Performed at [facility], [address], [place], [place] [address]    Report Status PENDING  Incomplete  Respiratory (~20 pathogens) panel by PCR     Status: None   Collection Time: 03/19/24  6:12 AM   Specimen: Urine, Clean Catch; Respiratory  Result Value Ref Range Status   Adenovirus NOT DETECTED NOT DETECTED Final   Coronavirus 229E NOT DETECTED NOT DETECTED Final    Comment: (NOTE) The Coronavirus on the Respiratory Panel, DOES NOT test for the novel  Coronavirus (2019 nCoV)    Coronavirus HKU1 NOT DETECTED NOT DETECTED Final   Coronavirus NL63 NOT DETECTED NOT DETECTED Final   Coronavirus OC43 NOT DETECTED NOT DETECTED Final   Metapneumovirus NOT DETECTED NOT DETECTED Final   Rhinovirus / Enterovirus NOT DETECTED NOT DETECTED Final   Influenza A NOT DETECTED NOT DETECTED Final   Influenza B NOT DETECTED NOT DETECTED Final   Parainfluenza Virus 1 NOT DETECTED NOT DETECTED Final   Parainfluenza Virus 2 NOT DETECTED NOT DETECTED Final   Parainfluenza Virus 3 NOT DETECTED NOT DETECTED Final   Parainfluenza Virus 4 NOT DETECTED NOT DETECTED Final   Respiratory Syncytial Virus NOT DETECTED NOT DETECTED Final   Bordetella pertussis NOT DETECTED NOT DETECTED Final   Bordetella Parapertussis NOT DETECTED NOT DETECTED Final   Chlamydophila pneumoniae NOT DETECTED NOT DETECTED Final   Mycoplasma pneumoniae NOT DETECTED NOT DETECTED Final    Comment: Performed at [facility], [address] [place], [place] [address]      Radiology Studies: CT Angio Chest PE W/Cm &/Or Wo Cm Result Date: 03/18/2024 CLINICAL DATA:  Increased weakness progressing through the week. Slight change in mentation. History of COPD and lung cancer. EXAM: CT ANGIOGRAPHY CHEST WITH CONTRAST TECHNIQUE: Multidetector  CT imaging of the chest was performed using the standard protocol during bolus administration of intravenous contrast. Multiplanar CT image reconstructions  and MIPs were obtained to evaluate the vascular anatomy. RADIATION DOSE REDUCTION: This exam was performed according to the departmental dose-optimization program which includes automated exposure control, adjustment of the mA and/or kV according to patient size and/or use of iterative reconstruction technique. CONTRAST:  75mL OMNIPAQUE  IOHEXOL  350 MG/ML SOLN COMPARISON:  Same day chest radiograph and CT 03/13/2024 FINDINGS: Cardiovascular: Cardiomegaly. No pericardial effusion. Negative for acute pulmonary embolism. Coronary artery and aortic atherosclerotic calcification. Normal caliber thoracic aorta. Mediastinum/Nodes: Small hiatal hernia. Trachea is unremarkable. Increased soft tissue thickening about the left hilum now measuring 4.5 x 3.2 cm, previously 3.3 x 2.9 cm. Increased size of a pretracheal node on series 10/image 86 now measuring 1.1 cm, previously 0.7 cm. New right infrahilar lymph node measuring 1.9 cm (series 10/image 156). Lungs/Pleura: Similar pulmonary fibrosis including peripheral interstitial and reticular opacities, traction bronchiectasis, and honeycombing at the lung bases. There is superimposed interstitial and airspace opacities particularly in the left greater than right lower lobes. Trace left pleural effusion. No pneumothorax. The subpleural mass along the anterolateral left upper lobe measures 3.2 x 3.6 cm. Using similar measuring technique this measured 3.1 x 2.8 cm on 03/13/2024. The adjacent left upper lobe nodule on 4/33 is unchanged and again measures 1.2 cm. Upper Abdomen: No acute abnormality. Musculoskeletal: Left reverse TSA. Advanced arthritis right shoulder. No acute fracture. No destructive osseous lesion. Review of the MIP images confirms the above findings. IMPRESSION: 1. Negative for acute pulmonary embolism. 2.  Similar pulmonary fibrosis with superimposed interstitial and airspace opacities particularly in the left greater than right lower lobes. Favor superimposed pneumonia. 3. Slightly increased size of the subpleural mass along the anterolateral left upper lobe, left infrahilar mass, and mediastinal lymphadenopathy since 03/13/2024. Given short interval increase this may be related to pneumonia. Rapidly progressive malignancy/metastatic disease is not excluded. 4. Aortic Atherosclerosis (ICD10-I70.0). Electronically Signed   By: Rozell Cornet M.D.   On: 03/18/2024 23:00   CT Head Wo Contrast Result Date: 03/18/2024 CLINICAL DATA:  Mental status change, unknown cause EXAM: CT HEAD WITHOUT CONTRAST TECHNIQUE: Contiguous axial images were obtained from the base of the skull through the vertex without intravenous contrast. RADIATION DOSE REDUCTION: This exam was performed according to the departmental dose-optimization program which includes automated exposure control, adjustment of the mA and/or kV according to patient size and/or use of iterative reconstruction technique. COMPARISON:  December 07, 2023 FINDINGS: Brain: The ventricles appear age appropriate. No mass effect or midline shift. Gray-white differentiation is preserved.Confluent periventricular and subcortical white matter hypoattenuation, most consistent with changes of severe chronic ischemic microvascular disease.No evidence of acute territorial infarction, extra-axial fluid collection, hemorrhage, or mass lesion. Remote lacunar infarct within the left basal ganglia. The basilar cisterns are patent without downward herniation. The cerebellar hemispheres and vermis are well formed without mass lesion or focal attenuation abnormality. Vascular: No hyperdense vessel. Calcified atherosclerotic plaque within the cavernous/supraclinoid ICA and intradural vertebral arteries. Skull: The skull base is especially degraded by patient motion. Negative for fracture or  focal lesion. Sinuses/Orbits: Degraded by patient motion. The paranasal sinuses and mastoids are clear.No retrobulbar hematoma. Other: None. IMPRESSION: 1. No acute intracranial abnormality, specifically, no acute hemorrhage, territorial infarction, or intracranial mass. 2. Similar cerebral volume loss with sequelae of advanced chronic ischemic microvascular disease. Electronically Signed   By: Rance Burrows M.D.   On: 03/18/2024 18:48   DG Chest Port 1 View Result Date: 03/18/2024 CLINICAL DATA:  Shortness of breath, weakness. EXAM: PORTABLE CHEST 1 VIEW COMPARISON:  December 26, 2018. FINDINGS: Stable cardiomediastinal silhouette. Status post left shoulder arthroplasty. Right internal jugular Port-A-Cath is noted with distal tip in expected position of cavoatrial junction. Mild diffuse reticular densities are noted throughout both lungs concerning for edema or atypical inflammation. Small left pleural effusion is noted. IMPRESSION: Mild diffuse reticular densities are noted throughout both lungs concerning for edema or atypical inflammation. Small left pleural effusion is noted. Electronically Signed   By: Rosalene Colon M.D.   On: 03/18/2024 16:43      Scheduled Meds:  aspirin  EC  81 mg Oral Daily   atorvastatin   40 mg Oral Daily   enoxaparin  (LOVENOX ) injection  40 mg Subcutaneous Q24H   furosemide  40 mg Intravenous Once   [START ON 03/21/2024] pneumococcal 20-valent conjugate vaccine  0.5 mL Intramuscular Tomorrow-1000   Continuous Infusions:  ceFEPime  (MAXIPIME ) IV 2 g (03/20/24 0929)     LOS: 2 days   Time spent: 35 minutes   Daren Eck, DO [facility] 03/20/2024, 12:49 PM   Available via Epic secure chat 7am-7pm After these hours, please refer to coverage provider listed on [email or web address]

## 2024-03-20 NOTE — Progress Notes (Signed)
 Occupational Therapy Evaluation Patient Details Name: Brittany Marks MRN: [id number] DOB: November 11, 1940 Today's Date: 03/20/2024   History of Present Illness   Brittany Marks is a 83 yr old female admitted to the hospital with lethargy, confusion, and weakness. She was found to have PNA, UTI, acute metabolic encephalopathy, and demand ischemia. PMH: chronic respiratory failure on 3-4L O2, bilateral rotator cuff tears, metastatic lung cancer to abdomen and bone with subsequent pathologic fracture, CVA in March 2025, COPD, CHF, chemo induced neuropathy     Clinical Impressions The pt is currently presenting with the below listed deficits (see OT problem list). She reported being modified independent to independent with ADLs at her baseline level of functioning. During the session today, she was noted to be guarded and occasionally resistant to attempting functional tasks. As such, the ability of this OT to fully determine her current functional abilities was difficult. The pt did require mod assist to roll in bed and max assist for simulated lower body dressing. She presented with grimacing and a withdrawal response when rolling was performed, with the pt pointing to her R forearm and R chest to indicate discomfort. OT will continue to follow the pt for further services during her hospital stay. Discharge recommendations to made accordingly, pending the pt's further participation in the therapy process. Short-term SNF rehab is recommended at current, as the pt does live alone and she appears to be presenting below her baseline level of functioning for self-care management.      If plan is discharge home, recommend the following:   A lot of help with bathing/dressing/bathroom;Direct supervision/assist for medications management;Assistance with cooking/housework;Assist for transportation;Help with stairs or ramp for entrance     Functional Status Assessment   Patient has had a recent decline in their  functional status and demonstrates the ability to make significant improvements in function in a reasonable and predictable amount of time.     Equipment Recommendations   Other (comment) (to be determined pending functional progress)     Recommendations for Other Services         Precautions/Restrictions   Precautions Precautions: Fall Restrictions Weight Bearing Restrictions Per Provider Order: No Other Position/Activity Restrictions: O2 dependent at baseline; uses 3-4L     Mobility Bed Mobility Overal bed mobility: Needs Assistance Bed Mobility: Rolling Rolling: Mod assist         General bed mobility comments: Pt required mod assist to roll left and right in bed. She declined to attempt supine to sit, stating I just don't want to.            ADL either performed or assessed with clinical judgement   ADL Overall ADL's : Needs assistance/impaired Eating/Feeding: Set up;Bed level   Grooming: Minimal assistance;Bed level           Upper Body Dressing : Minimal assistance;Bed level   Lower Body Dressing: Maximal assistance;Bed level                 General ADL Comments: ADL assist levels are primarily based on clinical judgement, as the pt presented with decreased effort during formal assessment attempts      Pertinent Vitals/Pain Pain Assessment Pain Assessment: Faces Pain Score: 4  Pain Location: She denied having pain at rest, however was noted to present with grimacing and guarding when rolling was attempted in bed. She pointed to her R wrist and R chest when asked where she was hurting. Pain Intervention(s): Limited activity within patient's tolerance, Monitored during session,  Repositioned     Extremity/Trunk Assessment Upper Extremity Assessment Upper Extremity Assessment: Right hand dominant;Left hand dominant;RUE deficits/detail;LUE deficits/detail RUE Deficits / Details: Chronic shoulder AROM limitations, with AROM for shoulder  flexion being <1/3 normal AROM. Elbow and hand AROM WFL. Grip strength 3+/5 to 4-/5 LUE Deficits / Details: Chronic shoulder AROM limitations, with AROM for shoulder flexion being <1/3 normal AROM. Elbow and hand AROM WFL. Grip strength 3+/5 to 4-/5   Lower Extremity Assessment Lower Extremity Assessment: Generalized weakness       Communication Communication Communication: No apparent difficulties   Cognition Arousal: Alert Behavior During Therapy:  (Guarded, Blunted) Cognition: Difficult to assess             OT - Cognition Comments: Oriented to person, place, month, and year.                   Following commands impaired:  (Followed 1 step commands with slightly increased time)                Home Living Family/patient expects to be discharged to:: Private residence Living Arrangements: Alone   Type of Home: House Home Access: Stairs to enter [employer] of Steps: 5-6 Entrance Stairs-Rails: Left;Right Home Layout: One level     Bathroom Shower/Tub: Runner, broadcasting/film/video: Rollator (4 wheels);Cane - single point;Shower seat   Additional Comments: oxygen       Prior Functioning/Environment Prior Level of Function : Independent/Modified Independent             Mobility Comments: She reported using a cane at all times when ambulating inside her home and using a rollator when outside her home. ADLs Comments: She reported being modified independent to independent with ADLs, she does not drive, and her friend, Tanya Fantasia provided transportation.    OT Problem List: Decreased strength;Decreased range of motion;Decreased activity tolerance;Decreased knowledge of use of DME or AE;Cardiopulmonary status limiting activity;Pain   OT Treatment/Interventions: Self-care/ADL training;Therapeutic exercise;Therapeutic activities;Energy conservation;DME and/or AE instruction;Patient/family education;Balance training      OT  Goals(Current goals can be found in the care plan section)   Acute Rehab OT Goals OT Goal Formulation: With patient Time For Goal Achievement: 04/03/24 Potential to Achieve Goals:  (Guarded) ADL Goals Pt Will Perform Upper Body Dressing: with set-up;sitting Pt Will Perform Lower Body Dressing: with supervision;with set-up;sitting/lateral leans;sit to/from stand Pt Will Transfer to Toilet: with supervision;ambulating Pt Will Perform Toileting - Clothing Manipulation and hygiene: with supervision;sit to/from stand   OT Frequency:  Min 2X/week       AM-PAC OT 6 Clicks Daily Activity     Outcome Measure Help from another person eating meals?: A Little Help from another person taking care of personal grooming?: A Little Help from another person toileting, which includes using toliet, bedpan, or urinal?: A Lot Help from another person bathing (including washing, rinsing, drying)?: A Lot Help from another person to put on and taking off regular upper body clothing?: A Little Help from another person to put on and taking off regular lower body clothing?: A Lot 6 Click Score: 15   End of Session Equipment Utilized During Treatment: Oxygen  Nurse Communication: Mobility status  Activity Tolerance: Patient limited by pain;Patient limited by fatigue Patient left: in bed;with call bell/phone within reach;with bed alarm set  OT Visit Diagnosis: Muscle weakness (generalized) (M62.81);Pain Pain - Right/Left: Right Pain - part of body: Arm  Time: 1147-1200 OT Time Calculation (min): 13 min Charges:  OT General Charges $OT Visit: 1 Visit OT Evaluation $OT Eval Moderate Complexity: 1 Mod     Taner Rzepka L Vitoria Conyer, OTR/L 03/20/2024, 1:15 PM

## 2024-03-21 DIAGNOSIS — J189 Pneumonia, unspecified organism: Secondary | ICD-10-CM | POA: Diagnosis not present

## 2024-03-21 DIAGNOSIS — Z515 Encounter for palliative care: Secondary | ICD-10-CM | POA: Diagnosis not present

## 2024-03-21 DIAGNOSIS — R531 Weakness: Secondary | ICD-10-CM

## 2024-03-21 LAB — LEGIONELLA PNEUMOPHILA SEROGP 1 UR AG: L. pneumophila Serogp 1 Ur Ag: NEGATIVE

## 2024-03-21 LAB — BASIC METABOLIC PANEL WITH GFR
Anion gap: 10 (ref 5–15)
BUN: 20 mg/dL (ref 8–23)
CO2: 23 mmol/L (ref 22–32)
Calcium: 8.8 mg/dL — ABNORMAL LOW (ref 8.9–10.3)
Chloride: 101 mmol/L (ref 98–111)
Creatinine, Ser: 0.84 mg/dL (ref 0.44–1.00)
GFR, Estimated: >60 mL/min (ref >60)
Glucose, Bld: 122 mg/dL — ABNORMAL HIGH (ref 70–99)
Potassium: 3.2 mmol/L — ABNORMAL LOW (ref 3.5–5.1)
Sodium: 134 mmol/L — ABNORMAL LOW (ref 135–145)

## 2024-03-21 LAB — MAGNESIUM: Magnesium: 1.8 mg/dL (ref 1.7–2.4)

## 2024-03-21 LAB — CBC
HCT: 30 % — ABNORMAL LOW (ref 36.0–46.0)
Hemoglobin: 10 g/dL — ABNORMAL LOW (ref 12.0–15.0)
MCH: 31.7 pg (ref 26.0–34.0)
MCHC: 33.3 g/dL (ref 30.0–36.0)
MCV: 95.2 fL (ref 80.0–100.0)
Platelets: 324 10*3/uL (ref 150–400)
RBC: 3.15 MIL/uL — ABNORMAL LOW (ref 3.87–5.11)
RDW: 12.9 % (ref 11.5–15.5)
WBC: 15.7 10*3/uL — ABNORMAL HIGH (ref 4.0–10.5)
nRBC: 0 % (ref 0.0–0.2)

## 2024-03-21 MED ORDER — GABAPENTIN 100 MG PO CAPS
100.0000 mg | ORAL_CAPSULE | Freq: Two times a day (BID) | ORAL | Status: DC
  Administered 2024-03-21 – 2024-03-26 (×11): 100 mg via ORAL
  Filled 2024-03-21 (×11): qty 1

## 2024-03-21 MED ORDER — POTASSIUM CHLORIDE CRYS ER 20 MEQ PO TBCR
40.0000 meq | EXTENDED_RELEASE_TABLET | Freq: Once | ORAL | Status: AC
  Administered 2024-03-21: 40 meq via ORAL
  Filled 2024-03-21: qty 2

## 2024-03-21 NOTE — Progress Notes (Signed)
 PROGRESS NOTE    Brittany Marks  ZOX:[id number] DOB: 1940/10/13 DOA: 03/18/2024 PCP: Vicente Graham, No     Brief Narrative:  Brittany Marks is a 83 y.o. 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   She presented to ED due to increase in weakness and lethargy for 1 week, was noted to be more confused from baseline.  Also had hypoxemia with saturation 82% on chronic home O2.  In the emergency department, found to have UTI, pneumonia as well as demand ischemia.  New events last 24 hours / Subjective: Asked her if she ate her breakfast, I did see untouched breakfast tray at bedside.  She yelled at me and said she was sick of people asking about her oral intake.  Said that she does not want to work with physical therapy.  Assessment & Plan: Principal Problem:   CAP (community acquired pneumonia) Active Problems:   Leukocytosis   Small cell lung cancer, right upper lobe (HCC)   Chemotherapy-induced neuropathy (HCC)   Tobacco abuse   Change in mental status   Acute on chronic hypoxic respiratory failure (HCC)   UTI (urinary tract infection)   Demand ischemia (HCC)   Acute metabolic encephalopathy   Hyperlipidemia   Chronic systolic CHF (congestive heart failure) (HCC)   Community-acquired pneumonia - CTA chest revealed superimposed interstitial and airspace opacities left greater than right - Respiratory viral panel negative - Strep pneumo antigen negative - Procalcitonin 0.36 - Cefepime   Acute on chronic hypoxic respiratory failure - Requires 4 L nasal cannula O2 at baseline.  Noted desaturation to 87% in the emergency  department.  Currently on 5 L.   UTI, POA - Urine culture is pending - Cefepime  as above  Demand ischemia - Insetting of respiratory failure, pneumonia.  Case was discussed with cardiology by ED physician, not a candidate for cath at this time.  Troponin has been elevated but flat trend 489, 546, 504, 502  Acute metabolic encephalopathy - At baseline, lives at home alone with caregivers.  Encephalopathy in setting of infection and hypoxia.  Seems to be resolved at this point  Metastatic lung cancer - On palliative chemo - Followed by Dr. Liam Redhead - Palliative care consulted  Hyperlipidemia - Lipitor  Chemo induced neuropathy - Gabapentin   Chronic systolic heart failure - Stable  Recent fracture of right proximal humerus - Fracture Feb 2025 - Followed by Dr. Alfredo Ano    Hypokalemia - Replace    DVT prophylaxis: enoxaparin  (LOVENOX ) injection 40 mg Start: 03/19/24 2200 Code Status: DNR Family Communication: Updated daughter over the phone 6/13  Disposition Plan: Home Status is: Inpatient Remains inpatient appropriate because: IV antibiotics    Antimicrobials:  Anti-infectives (From admission, onward)    Start     Dose/Rate Route Frequency Ordered Stop   03/19/24 0800  ceFEPIme  (MAXIPIME ) 2 g in sodium chloride  0.9 % 100 mL IVPB        2 g 200 mL/hr over 30 Minutes Intravenous Every 12 hours 03/19/24 0617 03/24/24 0759   03/19/24 0630  doxycycline (VIBRAMYCIN) 100 mg in sodium chloride  0.9 % 250 mL IVPB  Status:  Discontinued        100 mg 125 mL/hr  over 120 Minutes Intravenous Every 12 hours 03/19/24 0617 03/19/24 0801   03/18/24 2330  cefTRIAXone  (ROCEPHIN ) 1 g in sodium chloride  0.9 % 100 mL IVPB        1 g 200 mL/hr over 30 Minutes Intravenous  Once 03/18/24 2315 03/19/24 0014   03/18/24 2330  azithromycin  (ZITHROMAX ) 500 mg in sodium chloride  0.9 % 250 mL IVPB        500 mg 250 mL/hr over 60 Minutes Intravenous  Once 03/18/24 2315 03/19/24 0148         Objective: Vitals:   03/20/24 1226 03/20/24 1229 03/20/24 1946 03/21/24 0408  BP: (!) 154/79 137/76 126/67 (!) 113/52  Pulse: 91 92 100 91  Resp: 17  20 16   Temp: 98.5 F (36.9 C)  98.1 F (36.7 C) 98.6 F (37 C)  TempSrc: Oral  Oral Oral  SpO2: 96% 95% 91% 90%  Weight:      Height:        Intake/Output Summary (Last 24 hours) at 03/21/2024 1157 Last data filed at 03/21/2024 0529 Gross per 24 hour  Intake 100 ml  Output 1000 ml  Net -900 ml   Filed Weights   03/18/24 1548  Weight: 60.2 kg    Examination:  Patient declined physical examination today.  She appeared in no physical distress, did not have any conversational dyspnea.  Data Reviewed: I have personally reviewed following labs and imaging studies  CBC: Recent Labs  Lab 03/18/24 1708 03/19/24 0402 03/20/24 0401 03/21/24 0415  WBC 18.5* 17.7* 14.5* 15.7*  NEUTROABS 16.4*  --  11.3*  --   HGB 10.7* 9.8* 8.8* 10.0*  HCT 32.3* 29.4* 27.7* 30.0*  MCV 97.0 97.7 98.6 95.2  PLT 312 292 299 324   Basic Metabolic Panel: Recent Labs  Lab 03/18/24 1708 03/19/24 0402 03/20/24 0401 03/21/24 0415  NA 133* 133* 133* 134*  K 3.1* 4.0 3.4* 3.2*  CL 101 103 102 101  CO2 21* 19* 19* 23  GLUCOSE 104* 99 103* 122*  BUN 28* 24* 20 20  CREATININE 0.96 0.81 0.84 0.84  CALCIUM  9.6 8.7* 8.4* 8.8*  MG  --  1.8  --  1.8  PHOS  --  2.7  --   --    GFR: Estimated Creatinine Clearance: 44.1 mL/min (by C-G formula based on SCr of 0.84 mg/dL). Liver Function Tests: Recent Labs  Lab 03/18/24 1708 03/19/24 0402 03/20/24 0401  AST 22 19 19   ALT 19 16 17   ALKPHOS 97 88 95  BILITOT 1.2 1.1 1.0  PROT 7.5 7.0 6.6  ALBUMIN 3.3* 2.7* 2.8*   Recent Labs  Lab 03/18/24 1708  LIPASE 28   No results for input(s): AMMONIA in the last 168 hours. Coagulation Profile: No results for input(s): INR, PROTIME in the last 168 hours. Cardiac Enzymes: No results for input(s): CKTOTAL, CKMB, CKMBINDEX,  TROPONINI in the last 168 hours. BNP (last 3 results) No results for input(s): PROBNP in the last 8760 hours. HbA1C: Recent Labs    03/19/24 0402  HGBA1C 5.4   CBG: No results for input(s): GLUCAP in the last 168 hours. Lipid Profile: No results for input(s): CHOL, HDL, LDLCALC, TRIG, CHOLHDL, LDLDIRECT in the last 72 hours. Thyroid  Function Tests: Recent Labs    03/19/24 0402  TSH 1.678   Anemia Panel: No results for input(s): VITAMINB12, FOLATE, FERRITIN, TIBC, IRON, RETICCTPCT in the last 72 hours. Sepsis Labs: Recent Labs  Lab 03/18/24 1707 03/19/24 0402  PROCALCITON  --  0.36  LATICACIDVEN 1.0  --     Recent Results (from the past 240 hours)  Culture, blood (Routine X 2) w Reflex to ID Panel     Status: None (Preliminary result)   Collection Time: 03/18/24  5:08 PM   Specimen: BLOOD  Result Value Ref Range Status   Specimen Description   Final    BLOOD SITE NOT SPECIFIED Performed at [facility], [address] [place], [place] [address]    Special Requests   Final    BOTTLES DRAWN AEROBIC AND ANAEROBIC Blood Culture adequate volume Performed at [facility], [address] [place], [place] [address]    Culture   Final    NO GROWTH 3 DAYS Performed at [facility], [address] [place], [place] [address]    Report Status PENDING  Incomplete  Urine Culture (for pregnant, neutropenic or urologic patients or patients with an indwelling urinary catheter)     Status: Abnormal (Preliminary result)   Collection Time: 03/19/24  2:24 AM   Specimen: Urine, Clean Catch  Result Value Ref Range Status   Specimen Description   Final    URINE, CLEAN CATCH Performed at [facility], [address], [place], [place] [address]    Special Requests   Final    NONE Performed at [facility], [address], [place], [place] [address]    Culture (A)  Final     >=100,000 COLONIES/mL AEROCOCCUS URINAE Standardized susceptibility testing for this organism is not available. 30,000 COLONIES/mL ENTEROCOCCUS FAECALIS SUSCEPTIBILITIES TO FOLLOW Performed at [facility], [address], [place], [place] [address]    Report Status PENDING  Incomplete  Respiratory (~20 pathogens) panel by PCR     Status: None   Collection Time: 03/19/24  6:12 AM   Specimen: Urine, Clean Catch; Respiratory  Result Value Ref Range Status   Adenovirus NOT DETECTED NOT DETECTED Final   Coronavirus 229E NOT DETECTED NOT DETECTED Final    Comment: (NOTE) The Coronavirus on the Respiratory Panel, DOES NOT test for the novel  Coronavirus (2019 nCoV)    Coronavirus HKU1 NOT DETECTED NOT DETECTED Final   Coronavirus NL63 NOT DETECTED NOT DETECTED Final   Coronavirus OC43 NOT DETECTED NOT DETECTED Final   Metapneumovirus NOT DETECTED NOT DETECTED Final   Rhinovirus / Enterovirus NOT DETECTED NOT DETECTED Final   Influenza A NOT DETECTED NOT DETECTED Final   Influenza B NOT DETECTED NOT DETECTED Final   Parainfluenza Virus 1 NOT DETECTED NOT DETECTED Final   Parainfluenza Virus 2 NOT DETECTED NOT DETECTED Final   Parainfluenza Virus 3 NOT DETECTED NOT DETECTED Final   Parainfluenza Virus 4 NOT DETECTED NOT DETECTED Final   Respiratory Syncytial Virus NOT DETECTED NOT DETECTED Final   Bordetella pertussis NOT DETECTED NOT DETECTED Final   Bordetella Parapertussis NOT DETECTED NOT DETECTED Final   Chlamydophila pneumoniae NOT DETECTED NOT DETECTED Final   Mycoplasma pneumoniae NOT DETECTED NOT DETECTED Final    Comment: Performed at [facility], [address] [place], [place] [address]      Radiology Studies: No results found.     Scheduled Meds:  aspirin  EC  81 mg Oral Daily   atorvastatin   40 mg Oral Daily   enoxaparin  (LOVENOX ) injection  40 mg Subcutaneous Q24H   Continuous Infusions:  ceFEPime  (MAXIPIME ) IV 2 g (03/21/24 0924)     LOS: 3  days   Time  spent: 20 minutes   Daren Eck, DO [facility] 03/21/2024, 11:57 AM   Available via Epic secure chat 7am-7pm After these hours, please refer to coverage provider listed on [email or web address]

## 2024-03-21 NOTE — Plan of Care (Signed)
  Problem: Education: Goal: Knowledge of General Education information will improve Description: Including pain rating scale, medication(s)/side effects and non-pharmacologic comfort measures Outcome: Progressing   Problem: Health Behavior/Discharge Planning: Goal: Ability to manage health-related needs will improve Outcome: Progressing   Problem: Clinical Measurements: Goal: Ability to maintain clinical measurements within normal limits will improve Outcome: Progressing Goal: Will remain free from infection Outcome: Progressing Goal: Diagnostic test results will improve Outcome: Progressing Goal: Respiratory complications will improve Outcome: Progressing Goal: Cardiovascular complication will be avoided Outcome: Progressing   Problem: Nutrition: Goal: Adequate nutrition will be maintained Outcome: Progressing   Problem: Coping: Goal: Level of anxiety will decrease Outcome: Progressing   Problem: Elimination: Goal: Will not experience complications related to bowel motility Outcome: Progressing Goal: Will not experience complications related to urinary retention Outcome: Progressing   Problem: Pain Managment: Goal: General experience of comfort will improve and/or be controlled Outcome: Progressing   Problem: Safety: Goal: Ability to remain free from injury will improve Outcome: Progressing   Problem: Skin Integrity: Goal: Risk for impaired skin integrity will decrease Outcome: Progressing   Problem: Activity: Goal: Ability to tolerate increased activity will improve Outcome: Progressing   Problem: Clinical Measurements: Goal: Ability to maintain a body temperature in the normal range will improve Outcome: Progressing   Problem: Respiratory: Goal: Ability to maintain adequate ventilation will improve Outcome: Progressing Goal: Ability to maintain a clear airway will improve Outcome: Progressing   Problem: Education: Goal: Knowledge of the prescribed  therapeutic regimen will improve Outcome: Progressing   Problem: Activity: Goal: Ability to implement measures to reduce episodes of fatigue will improve Outcome: Progressing   Problem: Bowel/Gastric: Goal: Will not experience complications related to bowel motility Outcome: Progressing   Problem: Coping: Goal: Ability to identify and develop effective coping behavior will improve Outcome: Progressing   Problem: Nutritional: Goal: Maintenance of adequate nutrition will improve Outcome: Progressing   Problem: Activity: Goal: Risk for activity intolerance will decrease Outcome: Not Progressing

## 2024-03-21 NOTE — Progress Notes (Signed)
   Daily Progress Note   Patient Name: Brittany Marks       Date: 03/21/2024 DOB: Sep 06, 1941  Age: 83 y.o. MRN#: [id number] Attending Physician: Daren Eck, DO Primary Care Physician: Pcp, No Admit Date: 03/18/2024  Reason for Consultation/Follow-up: Establishing goals of care  Subjective: Chart Reviewed. Updates Received. Patient Assessed. Patient is awake and alert sitting up in bed. Denies concerns for nausea, vomiting, constipation, or diarrhea.  Patient reports she is feeling somewhat better today specifically with decreased shortness of breath.  States she is much appreciative of this.  Denies pain or discomfort.  Patient reports wishes to be allowed to rest.  She has become agitated with multiple interruptions as she tries to take a nap.  She is not interested in ongoing conversations at this time.  All questions answered and support provided.  Will continue to focus on symptom management and address goals of care and expectations in the future as provided and allowed by patient.   Length of Stay: 3 days  Vital Signs: BP (!) 164/95 (BP Location: Right Arm)   Pulse 84   Temp 98.4 F (36.9 C)   Resp (!) 24   Ht 5' 2 (1.575 m)   Wt 60.2 kg   SpO2 99%   BMI 24.27 kg/m  SpO2: SpO2: 99 % O2 Device: O2 Device: Nasal Cannula O2 Flow Rate: O2 Flow Rate (L/min): 5 L/min Last Weight  Most recent update: 03/18/2024  3:48 PM    Weight  60.2 kg (132 lb 11.5 oz)             Intake/Output Summary (Last 24 hours) at 03/21/2024 1403 Last data filed at 03/21/2024 0529 Gross per 24 hour  Intake 100 ml  Output 1000 ml  Net -900 ml    Physical Exam: Declines physical assessment.  No acute distress noted.  5 L nasal cannula in place.  Normal breathing pattern noted.    Palliative Care Assessment & Plan  HPI: 83 yo admitted with acute respiratory failure and altered mental status in setting of UTI and CAP, recently discharged from [facility], under active treatment for  metastatic small cell lung cancer with Dr. Liam Redhead.   Code Status: DNR  Recommendations/Plan:  Continue with current plan of care per medical team  PMT will continue to support and follow on as needed basis. Please secure chat covering palliative provider for urgent unmet palliative needs.   Symptom Management: Morphine  1-2mg  q2 for Tachypnea and Respirations >25/Distress Scheduled Bronchodilators Abx per primary Service Oncology treatment plan TBD Will need to address functional status decline and frailty. Thank you for allowing the Palliative Medicine Team to assist in the care of this patient.  Palliative Medicine Team providers are available by phone from 7am to 7pm daily and can be reached through the team cell phone. Should this patient require assistance outside of these hours, please call the patient's attending physician.  Any controlled substances utilized were prescribed in the context of palliative care. PDMP has been reviewed.  Visit consisted of counseling and education dealing with the complex and emotionally intense issues of symptom management and palliative care in the setting of serious and potentially life-threatening illness.  Dellia Ferguson, AGPCNP-BC  Palliative Medicine [facility]  [phone]  *Please note that this is a verbal dictation therefore any spelling or grammatical errors are due to the Dragon Medical One system interpretation.

## 2024-03-21 NOTE — Progress Notes (Signed)
 PT Cancellation Note  Patient Details Name: Brittany Marks MRN: [id number] DOB: 06-26-1941   Cancelled Treatment:    Reason Eval/Treat Not Completed: Patient declined, no reason specified    Tanda Falter, PT Acute Rehabilitation  Office: [phone]

## 2024-03-22 DIAGNOSIS — J189 Pneumonia, unspecified organism: Secondary | ICD-10-CM | POA: Diagnosis not present

## 2024-03-22 LAB — BASIC METABOLIC PANEL WITH GFR
Anion gap: 9 (ref 5–15)
BUN: 27 mg/dL — ABNORMAL HIGH (ref 8–23)
CO2: 21 mmol/L — ABNORMAL LOW (ref 22–32)
Calcium: 8.9 mg/dL (ref 8.9–10.3)
Chloride: 104 mmol/L (ref 98–111)
Creatinine, Ser: 0.77 mg/dL (ref 0.44–1.00)
GFR, Estimated: >60 mL/min (ref >60)
Glucose, Bld: 92 mg/dL (ref 70–99)
Potassium: 3.7 mmol/L (ref 3.5–5.1)
Sodium: 134 mmol/L — ABNORMAL LOW (ref 135–145)

## 2024-03-22 LAB — CBC
HCT: 29.6 % — ABNORMAL LOW (ref 36.0–46.0)
Hemoglobin: 9.6 g/dL — ABNORMAL LOW (ref 12.0–15.0)
MCH: 32.2 pg (ref 26.0–34.0)
MCHC: 32.4 g/dL (ref 30.0–36.0)
MCV: 99.3 fL (ref 80.0–100.0)
Platelets: 348 10*3/uL (ref 150–400)
RBC: 2.98 MIL/uL — ABNORMAL LOW (ref 3.87–5.11)
RDW: 13 % (ref 11.5–15.5)
WBC: 14.5 10*3/uL — ABNORMAL HIGH (ref 4.0–10.5)
nRBC: 0 % (ref 0.0–0.2)

## 2024-03-22 LAB — URINE CULTURE: Culture: >=100000 — AB

## 2024-03-22 LAB — PROCALCITONIN: Procalcitonin: 0.12 ng/mL

## 2024-03-22 MED ORDER — ORAL CARE MOUTH RINSE: 15.0000 mL | OROMUCOSAL | Status: DC | PRN

## 2024-03-22 NOTE — Progress Notes (Signed)
 PT Cancellation Note  Patient Details Name: Brittany Marks MRN: [id number] DOB: 1941/06/08   Cancelled Treatment:     Tried to assess patient again today. Tried with gentle approach explaining our services and we are here to help her if her goal is to return home, we are here to assist if she has any needs or to help her get stronger. However if she feels she does not want therapy that can be her decision, however she will need to participate with TOC for planning a safe DC.  She became frustrated as we were talking and loudly yelled out reflecting she did not want to discuss this anymore.   At this time PT sill sign off, and if PT  is needed again for skilled session and pt willing to participate , we would be happy to try again. Thank you     Jim Motts 03/22/2024, 5:03 PM Cheral Cordoba, PT, MPT Acute Rehabilitation Services Office: [phone] If a weekend: secure chat groups: [facility] PT, [facility] OT, [facility] SLP 03/22/2024

## 2024-03-22 NOTE — Plan of Care (Signed)
  Problem: Education: Goal: Knowledge of General Education information will improve Description: Including pain rating scale, medication(s)/side effects and non-pharmacologic comfort measures Outcome: Progressing   Problem: Health Behavior/Discharge Planning: Goal: Ability to manage health-related needs will improve Outcome: Progressing   Problem: Clinical Measurements: Goal: Ability to maintain clinical measurements within normal limits will improve Outcome: Progressing Goal: Will remain free from infection Outcome: Progressing Goal: Diagnostic test results will improve Outcome: Progressing Goal: Respiratory complications will improve Outcome: Progressing Goal: Cardiovascular complication will be avoided Outcome: Progressing   Problem: Activity: Goal: Risk for activity intolerance will decrease Outcome: Progressing   Problem: Nutrition: Goal: Adequate nutrition will be maintained Outcome: Progressing   Problem: Elimination: Goal: Will not experience complications related to bowel motility Outcome: Progressing Goal: Will not experience complications related to urinary retention Outcome: Progressing   Problem: Pain Managment: Goal: General experience of comfort will improve and/or be controlled Outcome: Progressing   Problem: Safety: Goal: Ability to remain free from injury will improve Outcome: Progressing   Problem: Skin Integrity: Goal: Risk for impaired skin integrity will decrease Outcome: Progressing   Problem: Activity: Goal: Ability to tolerate increased activity will improve Outcome: Progressing   Problem: Clinical Measurements: Goal: Ability to maintain a body temperature in the normal range will improve Outcome: Progressing   Problem: Respiratory: Goal: Ability to maintain adequate ventilation will improve Outcome: Progressing Goal: Ability to maintain a clear airway will improve Outcome: Progressing   Problem: Education: Goal: Knowledge of the  prescribed therapeutic regimen will improve Outcome: Progressing   Problem: Activity: Goal: Ability to implement measures to reduce episodes of fatigue will improve Outcome: Progressing   Problem: Bowel/Gastric: Goal: Will not experience complications related to bowel motility Outcome: Progressing   Problem: Coping: Goal: Ability to identify and develop effective coping behavior will improve Outcome: Progressing   Problem: Nutritional: Goal: Maintenance of adequate nutrition will improve Outcome: Progressing   Problem: Coping: Goal: Level of anxiety will decrease Outcome: Not Progressing

## 2024-03-22 NOTE — Progress Notes (Signed)
 PROGRESS NOTE    Brittany Marks  ZOX:[id number] DOB: May 24, 1941 DOA: 03/18/2024 PCP: Vicente Graham, No     Brief Narrative:  Brittany Marks is a 83 y.o. 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   She presented to ED due to increase in weakness and lethargy for 1 week, was noted to be more confused from baseline.  Also had hypoxemia with saturation 82% on chronic home O2.  In the emergency department, found to have UTI, pneumonia as well as demand ischemia.  New events last 24 hours / Subjective: No new issues reported overnight.   Assessment & Plan: Principal Problem:   CAP (community acquired pneumonia) Active Problems:   Leukocytosis   Small cell lung cancer, right upper lobe (HCC)   Chemotherapy-induced neuropathy (HCC)   Tobacco abuse   Change in mental status   Acute on chronic hypoxic respiratory failure (HCC)   UTI (urinary tract infection)   Demand ischemia (HCC)   Acute metabolic encephalopathy   Hyperlipidemia   Chronic systolic CHF (congestive heart failure) (HCC)   Community-acquired pneumonia - CTA chest revealed superimposed interstitial and airspace opacities left greater than right - Respiratory viral panel negative - Strep pneumo antigen negative - Procalcitonin 0.36 --> 0.12  - WBC 18.5 --> 17.7 --> 14.5 --> 15.7 --> 14.5 - Cefepime   Acute on chronic hypoxic respiratory failure - Requires 4 L nasal cannula O2 at baseline.  Noted desaturation to 87% in the emergency department.  Wean   UTI, POA - Urine culture >100,000 aerococcus  - Cefepime  as above  Demand ischemia - Insetting of respiratory  failure, pneumonia.  Case was discussed with cardiology by ED physician, not a candidate for cath at this time.  Troponin has been elevated but flat trend 489, 546, 504, 502  Acute metabolic encephalopathy - At baseline, lives at home alone with caregivers.  Encephalopathy in setting of infection and hypoxia.  Seems to be resolved at this point  Metastatic lung cancer - On palliative chemo - Followed by Dr. Liam Redhead - Palliative care consulted  Hyperlipidemia - Lipitor  Chemo induced neuropathy - Gabapentin   Chronic systolic heart failure - Stable  Recent fracture of right proximal humerus - Fracture Feb 2025 - Followed by Dr. Alfredo Ano     DVT prophylaxis: enoxaparin  (LOVENOX ) injection 40 mg Start: 03/19/24 2200 Code Status: DNR Family Communication: Updated daughter over the phone today  Disposition Plan: Home Status is: Inpatient Remains inpatient appropriate because: IV antibiotics    Antimicrobials:  Anti-infectives (From admission, onward)    Start     Dose/Rate Route Frequency Ordered Stop   03/19/24 0800  ceFEPIme  (MAXIPIME ) 2 g in sodium chloride  0.9 % 100 mL IVPB        2 g 200 mL/hr over 30 Minutes Intravenous Every 12 hours 03/19/24 0617 03/24/24 0759   03/19/24 0630  doxycycline (VIBRAMYCIN) 100 mg in sodium chloride  0.9 % 250 mL IVPB  Status:  Discontinued        100 mg 125 mL/hr over 120 Minutes Intravenous Every 12 hours 03/19/24 0617 03/19/24 0801   03/18/24 2330  cefTRIAXone  (ROCEPHIN ) 1 g in sodium chloride  0.9 % 100 mL IVPB  1 g 200 mL/hr over 30 Minutes Intravenous  Once 03/18/24 2315 03/19/24 0014   03/18/24 2330  azithromycin  (ZITHROMAX ) 500 mg in sodium chloride  0.9 % 250 mL IVPB        500 mg 250 mL/hr over 60 Minutes Intravenous  Once 03/18/24 2315 03/19/24 0148        Objective: Vitals:   03/21/24 1246 03/21/24 2035 03/22/24 0458 03/22/24 1143  BP: (!) 164/95 (!) 137/57 136/68 118/67  Pulse: 84 83 78 75  Resp: (!) 24 18 18 18    Temp: 98.4 F (36.9 C) (!) 97.4 F (36.3 C) 98.2 F (36.8 C) (!) 97.4 F (36.3 C)  TempSrc:  Oral Oral Oral  SpO2: 99% 95% 95% 100%  Weight:      Height:        Intake/Output Summary (Last 24 hours) at 03/22/2024 1426 Last data filed at 03/22/2024 0100 Gross per 24 hour  Intake --  Output 500 ml  Net -500 ml   Filed Weights   03/18/24 1548  Weight: 60.2 kg    Examination: General exam: Appears calm and comfortable, sleeping comfortably    Data Reviewed: I have personally reviewed following labs and imaging studies  CBC: Recent Labs  Lab 03/18/24 1708 03/19/24 0402 03/20/24 0401 03/21/24 0415 03/22/24 0401  WBC 18.5* 17.7* 14.5* 15.7* 14.5*  NEUTROABS 16.4*  --  11.3*  --   --   HGB 10.7* 9.8* 8.8* 10.0* 9.6*  HCT 32.3* 29.4* 27.7* 30.0* 29.6*  MCV 97.0 97.7 98.6 95.2 99.3  PLT 312 292 299 324 348   Basic Metabolic Panel: Recent Labs  Lab 03/18/24 1708 03/19/24 0402 03/20/24 0401 03/21/24 0415 03/22/24 0401  NA 133* 133* 133* 134* 134*  K 3.1* 4.0 3.4* 3.2* 3.7  CL 101 103 102 101 104  CO2 21* 19* 19* 23 21*  GLUCOSE 104* 99 103* 122* 92  BUN 28* 24* 20 20 27*  CREATININE 0.96 0.81 0.84 0.84 0.77  CALCIUM  9.6 8.7* 8.4* 8.8* 8.9  MG  --  1.8  --  1.8  --   PHOS  --  2.7  --   --   --    GFR: Estimated Creatinine Clearance: 46.3 mL/min (by C-G formula based on SCr of 0.77 mg/dL). Liver Function Tests: Recent Labs  Lab 03/18/24 1708 03/19/24 0402 03/20/24 0401  AST 22 19 19   ALT 19 16 17   ALKPHOS 97 88 95  BILITOT 1.2 1.1 1.0  PROT 7.5 7.0 6.6  ALBUMIN 3.3* 2.7* 2.8*   Recent Labs  Lab 03/18/24 1708  LIPASE 28   No results for input(s): AMMONIA in the last 168 hours. Coagulation Profile: No results for input(s): INR, PROTIME in the last 168 hours. Cardiac Enzymes: No results for input(s): CKTOTAL, CKMB, CKMBINDEX, TROPONINI in the last 168 hours. BNP (last 3 results) No results for input(s): PROBNP in the last 8760  hours. HbA1C: No results for input(s): HGBA1C in the last 72 hours.  CBG: No results for input(s): GLUCAP in the last 168 hours. Lipid Profile: No results for input(s): CHOL, HDL, LDLCALC, TRIG, CHOLHDL, LDLDIRECT in the last 72 hours. Thyroid  Function Tests: No results for input(s): TSH, T4TOTAL, FREET4, T3FREE, THYROIDAB in the last 72 hours.  Anemia Panel: No results for input(s): VITAMINB12, FOLATE, FERRITIN, TIBC, IRON, RETICCTPCT in the last 72 hours. Sepsis Labs: Recent Labs  Lab 03/18/24 1707 03/19/24 0402 03/22/24 0401  PROCALCITON  --  0.36 0.12  LATICACIDVEN 1.0  --   --  Recent Results (from the past 240 hours)  Culture, blood (Routine X 2) w Reflex to ID Panel     Status: None (Preliminary result)   Collection Time: 03/18/24  5:08 PM   Specimen: BLOOD  Result Value Ref Range Status   Specimen Description   Final    BLOOD SITE NOT SPECIFIED Performed at [facility], [address], [place], [place] [address]    Special Requests   Final    BOTTLES DRAWN AEROBIC AND ANAEROBIC Blood Culture adequate volume Performed at [facility], [address] [place], [place] [address]    Culture   Final    NO GROWTH 4 DAYS Performed at [facility], [address] [place], [place] [address]    Report Status PENDING  Incomplete  Urine Culture (for pregnant, neutropenic or urologic patients or patients with an indwelling urinary catheter)     Status: Abnormal   Collection Time: 03/19/24  2:24 AM   Specimen: Urine, Clean Catch  Result Value Ref Range Status   Specimen Description   Final    URINE, CLEAN CATCH Performed at [facility], [address], [place], [place] [address]    Special Requests   Final    NONE Performed at [facility], [address], [place], [place] [address]    Culture (A)  Final    >=100,000 COLONIES/mL AEROCOCCUS  URINAE Standardized susceptibility testing for this organism is not available. 30,000 COLONIES/mL ENTEROCOCCUS FAECALIS    Report Status 03/22/2024 FINAL  Final   Organism ID, Bacteria ENTEROCOCCUS FAECALIS (A)  Final      Susceptibility   Enterococcus faecalis - MIC*    AMPICILLIN <=2 SENSITIVE Sensitive     NITROFURANTOIN <=16 SENSITIVE Sensitive     VANCOMYCIN  2 SENSITIVE Sensitive     * 30,000 COLONIES/mL ENTEROCOCCUS FAECALIS  Respiratory (~20 pathogens) panel by PCR     Status: None   Collection Time: 03/19/24  6:12 AM   Specimen: Urine, Clean Catch; Respiratory  Result Value Ref Range Status   Adenovirus NOT DETECTED NOT DETECTED Final   Coronavirus 229E NOT DETECTED NOT DETECTED Final    Comment: (NOTE) The Coronavirus on the Respiratory Panel, DOES NOT test for the novel  Coronavirus (2019 nCoV)    Coronavirus HKU1 NOT DETECTED NOT DETECTED Final   Coronavirus NL63 NOT DETECTED NOT DETECTED Final   Coronavirus OC43 NOT DETECTED NOT DETECTED Final   Metapneumovirus NOT DETECTED NOT DETECTED Final   Rhinovirus / Enterovirus NOT DETECTED NOT DETECTED Final   Influenza A NOT DETECTED NOT DETECTED Final   Influenza B NOT DETECTED NOT DETECTED Final   Parainfluenza Virus 1 NOT DETECTED NOT DETECTED Final   Parainfluenza Virus 2 NOT DETECTED NOT DETECTED Final   Parainfluenza Virus 3 NOT DETECTED NOT DETECTED Final   Parainfluenza Virus 4 NOT DETECTED NOT DETECTED Final   Respiratory Syncytial Virus NOT DETECTED NOT DETECTED Final   Bordetella pertussis NOT DETECTED NOT DETECTED Final   Bordetella Parapertussis NOT DETECTED NOT DETECTED Final   Chlamydophila pneumoniae NOT DETECTED NOT DETECTED Final   Mycoplasma pneumoniae NOT DETECTED NOT DETECTED Final    Comment: Performed at [facility], [address], [place], [place] [address]      Radiology Studies: No results found.     Scheduled Meds:  aspirin  EC  81 mg Oral Daily   atorvastatin   40 mg Oral Daily    enoxaparin  (LOVENOX ) injection  40 mg Subcutaneous Q24H   gabapentin   100 mg Oral BID   Continuous Infusions:  ceFEPime  (MAXIPIME ) IV 2 g (03/22/24 0824)     LOS: 4 days   Time spent: 20 minutes   Daren Eck, DO [facility] 03/22/2024, 2:26 PM   Available via Epic secure chat 7am-7pm After these hours, please refer to coverage provider listed on [email or web address]

## 2024-03-23 ENCOUNTER — Inpatient Hospital Stay

## 2024-03-23 ENCOUNTER — Inpatient Hospital Stay: Attending: Internal Medicine

## 2024-03-23 ENCOUNTER — Inpatient Hospital Stay: Admitting: Internal Medicine

## 2024-03-23 DIAGNOSIS — J189 Pneumonia, unspecified organism: Secondary | ICD-10-CM | POA: Diagnosis not present

## 2024-03-23 LAB — BASIC METABOLIC PANEL WITH GFR
Anion gap: 9 (ref 5–15)
BUN: 26 mg/dL — ABNORMAL HIGH (ref 8–23)
CO2: 22 mmol/L (ref 22–32)
Calcium: 8.8 mg/dL — ABNORMAL LOW (ref 8.9–10.3)
Chloride: 102 mmol/L (ref 98–111)
Creatinine, Ser: 0.8 mg/dL (ref 0.44–1.00)
GFR, Estimated: >60 mL/min (ref >60)
Glucose, Bld: 100 mg/dL — ABNORMAL HIGH (ref 70–99)
Potassium: 3.6 mmol/L (ref 3.5–5.1)
Sodium: 133 mmol/L — ABNORMAL LOW (ref 135–145)

## 2024-03-23 LAB — CBC
HCT: 28.5 % — ABNORMAL LOW (ref 36.0–46.0)
Hemoglobin: 9.2 g/dL — ABNORMAL LOW (ref 12.0–15.0)
MCH: 31.3 pg (ref 26.0–34.0)
MCHC: 32.3 g/dL (ref 30.0–36.0)
MCV: 96.9 fL (ref 80.0–100.0)
Platelets: 379 10*3/uL (ref 150–400)
RBC: 2.94 MIL/uL — ABNORMAL LOW (ref 3.87–5.11)
RDW: 12.8 % (ref 11.5–15.5)
WBC: 15.1 10*3/uL — ABNORMAL HIGH (ref 4.0–10.5)
nRBC: 0 % (ref 0.0–0.2)

## 2024-03-23 LAB — CULTURE, BLOOD (ROUTINE X 2)
Culture: NO GROWTH
Special Requests: ADEQUATE

## 2024-03-23 MED ORDER — SODIUM CHLORIDE 0.9% FLUSH
10.0000 mL | Freq: Two times a day (BID) | INTRAVENOUS | Status: DC
  Administered 2024-03-23 – 2024-03-26 (×4): 10 mL

## 2024-03-23 MED ORDER — CHLORHEXIDINE GLUCONATE CLOTH 2 % EX PADS
6.0000 | MEDICATED_PAD | Freq: Every day | CUTANEOUS | Status: DC
  Administered 2024-03-24 – 2024-03-26 (×3): 6 via TOPICAL

## 2024-03-23 NOTE — Progress Notes (Signed)
 Mobility Specialist - Progress Note   03/23/24 1106  Mobility  Activity Stood at bedside  Level of Assistance Moderate assist, patient does 50-74%  Assistive Device Front wheel walker  Range of Motion/Exercises Active Assistive  Activity Response Tolerated fair  Mobility visit 1 Mobility  Mobility Specialist Start Time (ACUTE ONLY) 1030  Mobility Specialist Stop Time (ACUTE ONLY) 1050  Mobility Specialist Time Calculation (min) (ACUTE ONLY) 20 min   Pt was found in bed and agreeable to mobilize. C/o pain with movement and stated feeling SOB with mobility. Able to stand and grew fatigued. Returned to bed with all needs met. Call bell in reach and RN notified of session.  Lorna Rose,  Mobility Specialist Can be reached via Secure Chat

## 2024-03-23 NOTE — Plan of Care (Signed)

## 2024-03-23 NOTE — Evaluation (Signed)
 Physical Therapy Evaluation Patient Details Name: Brittany Marks MRN: [id number] DOB: 09-04-1941 Today's Date: 03/23/2024  History of Present Illness  Ms. Brittany Marks is a 83 yr old female admitted to the hospital with lethargy, confusion, and weakness. She was found to have PNA, UTI, acute metabolic encephalopathy, and demand ischemia. PMH: chronic respiratory failure on 3-4L O2, bilateral rotator cuff tears, metastatic lung cancer to abdomen and bone with subsequent pathologic fracture, CVA in March 2025, COPD, CHF, chemo induced neuropathy. Patient was DC from [facility] 6/9 after extended time.  Clinical Impression  Pt admitted with above diagnosis.  Pt currently with functional limitations due to the deficits listed below (see PT Problem List). Pt will benefit from acute skilled PT to increase their independence and safety with mobility to allow discharge.    The patient is limited in information  since she returned home from SNF just before return to ED. Patient required max assistance for mobility to sit up and pivot to recliner. Patient has limited use  for UE's due to shoulder injuries. Patient reports was ambulating with rollator at return from rehab. No family present and patient vague about how many hours of caregivers are available and if they can care for her at a max assist level. Patient will benefit from continued inpatient follow up therapy, <3 hours/day Patient on 4 L [place]. 100% SPO2         If plan is discharge home, recommend the following: Two people to help with walking and/or transfers;A lot of help with bathing/dressing/bathroom;Assistance with cooking/housework;Direct supervision/assist for medications management;Assist for transportation;Help with stairs or ramp for entrance   Can travel by private vehicle        Equipment Recommendations None recommended by PT  Recommendations for Other Services       Functional Status Assessment Patient has had a recent decline in their  functional status and demonstrates the ability to make significant improvements in function in a reasonable and predictable amount of time.     Precautions / Restrictions Precautions Precautions: Fall Precaution/Restrictions Comments: both shoulders limited ROM Restrictions Other Position/Activity Restrictions: O2 dependent at baseline; uses 3-4L      Mobility  Bed Mobility   Bed Mobility: Rolling, Supine to Sit Rolling: Max assist   Supine to sit: Max assist     General bed mobility comments: patient did not roll as instruccted, assisted to sitting using bed pad to sit upright and max assistance    Transfers Overall transfer level: Needs assistance   Transfers: Sit to/from Stand, Bed to chair/wheelchair/BSC Sit to Stand: Max assist   Step pivot transfers: Max assist       General transfer comment: bear hug technique as patient could not get hands up to RW(limited ROM). Assisted to power up and step to recliner with short shuffling steps, barely turned to Recliner before sitting down.    Ambulation/Gait                  Stairs            Wheelchair Mobility     Tilt Bed    Modified Rankin (Stroke Patients Only)       Balance Overall balance assessment: Needs assistance Sitting-balance support: No upper extremity supported, Feet supported Sitting balance-Leahy Scale: Poor Sitting balance - Comments: left lean   Standing balance support: During functional activity Standing balance-Leahy Scale: Poor  Pertinent Vitals/Pain Pain Assessment Faces Pain Scale: Hurts even more Pain Location: generalized Pain Descriptors / Indicators: Discomfort Pain Intervention(s): Limited activity within patient's tolerance    Home Living Family/patient expects to be discharged to:: Private residence Living Arrangements: Alone Available Help at Discharge: Personal care attendant;Available PRN/intermittently (pt  reports nearly 24/7) Type of Home: House Home Access: Stairs to enter Entrance Stairs-Rails: Lawyer of Steps: 5-6   Home Layout: One level Home Equipment: Rollator (4 wheels);Cane - single point;Shower seat;Wheelchair - manual      Prior Function Prior Level of Function : Needs assist             Mobility Comments: by report, ambulating with rollator  after DC from SNF ADLs Comments: has caregivers arranged since DC from SNF recently     Extremity/Trunk Assessment   Upper Extremity Assessment RUE Deficits / Details: Chronic shoulder AROM limitations, with AROM for shoulder flexion being <1/3 normal AROM. Elbow and hand AROM WFL. Grip strength 3+/5 to 4-/5 LUE Deficits / Details: Chronic shoulder AROM limitations, with AROM for shoulder flexion being <1/3 normal AROM. Elbow and hand AROM WFL. Grip strength 3+/5 to 4-/5    Lower Extremity Assessment Lower Extremity Assessment: Generalized weakness    Cervical / Trunk Assessment Cervical / Trunk Assessment: Kyphotic  Communication   Communication Communication: No apparent difficulties    Cognition Arousal: Alert Behavior During Therapy: Flat affect   PT - Cognitive impairments: Orientation, Awareness, Safety/Judgement, No family/caregiver present to determine baseline   Orientation impairments: Time                   PT - Cognition Comments: limited information provided by pt, vague about caregivers hours, much encouragement to participate, having a had purewick leak in bed was an impetus to get OOB, decreased acknowledgement of deficits and   need for 24/7 caregivers. Family not proesent to give info Following commands: Impaired Following commands impaired: Follows one step commands with increased time     Cueing Cueing Techniques: Verbal cues, Tactile cues     General Comments      Exercises     Assessment/Plan    PT Assessment Patient needs continued PT services  PT  Problem List Decreased strength;Decreased activity tolerance;Decreased mobility;Decreased safety awareness;Decreased range of motion;Decreased balance;Decreased knowledge of use of DME;Decreased knowledge of precautions       PT Treatment Interventions DME instruction;Therapeutic activities;Gait training;Functional mobility training;Therapeutic exercise;Patient/family education    PT Goals (Current goals can be found in the Care Plan section)  Acute Rehab PT Goals Patient Stated Goal: go home to [facility] the cat PT Goal Formulation: With patient Time For Goal Achievement: 04/06/24 Potential to Achieve Goals: Fair    Frequency Min 2X/week     Co-evaluation               AM-PAC PT 6 Clicks Mobility  Outcome Measure Help needed turning from your back to your side while in a flat bed without using bedrails?: Total Help needed moving from lying on your back to sitting on the side of a flat bed without using bedrails?: Total Help needed moving to and from a bed to a chair (including a wheelchair)?: Total Help needed standing up from a chair using your arms (e.g., wheelchair or bedside chair)?: Total Help needed to walk in hospital room?: Total Help needed climbing 3-5 steps with a railing? : Total 6 Click Score: 6    End of Session Equipment Utilized During Treatment:  Gait belt Activity Tolerance: Patient limited by fatigue Patient left: in chair;with call bell/phone within reach Nurse Communication: Mobility status PT Visit Diagnosis: Other abnormalities of gait and mobility (R26.89);Muscle weakness (generalized) (M62.81)    Time: 1914-7829 PT Time Calculation (min) (ACUTE ONLY): 23 min   Charges:   PT Evaluation $PT Eval Low Complexity: 1 Low PT Treatments $Therapeutic Activity: 8-22 mins PT General Charges $$ ACUTE PT VISIT: 1 Visit         Abelina Hoes PT Acute Rehabilitation Services Office [phone]   Dareen Ebbing 03/23/2024, 3:30 PM

## 2024-03-23 NOTE — Progress Notes (Signed)
 PROGRESS NOTE    Brittany Marks  UEA:[id number] DOB: [birth year] DOA: 03/18/2024 PCP: Vicente Graham, No     Brief Narrative:  Brittany Marks is a 83 y.o. 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   She presented to ED due to increase in weakness and lethargy for 1 week, was noted to be more confused from baseline.  Also had hypoxemia with saturation 82% on chronic home O2.  In the emergency department, found to have UTI, pneumonia as well as demand ischemia.  New events last 24 hours / Subjective: Feeling okay.  She had declined PT several times.  I relayed my concern to her that she cannot be discharged home until she can work on gaining some strength.  She stated she was willing to get up with physical therapy today.  Assessment & Plan: Principal Problem:   CAP (community acquired pneumonia) Active Problems:   Leukocytosis   Small cell lung cancer, right upper lobe (HCC)   Chemotherapy-induced neuropathy (HCC)   Tobacco abuse   Change in mental status   Acute on chronic hypoxic respiratory failure (HCC)   UTI (urinary tract infection)   Demand ischemia (HCC)   Acute metabolic encephalopathy   Hyperlipidemia   Chronic systolic CHF (congestive heart failure) (HCC)   Community-acquired pneumonia - CTA chest revealed superimposed interstitial and airspace opacities left greater than right - Respiratory viral panel negative - Strep pneumo antigen negative - Procalcitonin 0.36 --> 0.12  - WBC 18.5 --> 17.7 --> 14.5 --> 15.7 --> 14.5 --> 15.1  - Cefepime   Acute on chronic hypoxic respiratory failure - Requires 4 L nasal cannula  O2 at baseline.  Noted desaturation to 87% in the emergency department.  Wean   UTI, POA - Urine culture >100,000 aerococcus  - Cefepime  as above  Demand ischemia - Insetting of respiratory failure, pneumonia.  Case was discussed with cardiology by ED physician, not a candidate for cath at this time.  Troponin has been elevated but flat trend 489, 546, 504, 502  Acute metabolic encephalopathy - At baseline, lives at home alone with caregivers.  Encephalopathy in setting of infection and hypoxia.  Seems to be resolved at this point  Metastatic lung cancer - On palliative chemo - Followed by Dr. Liam Redhead - Palliative care consulted  Hyperlipidemia - Lipitor  Chemo induced neuropathy - Gabapentin   Chronic systolic heart failure - Stable  Recent fracture of right proximal humerus - Fracture Feb 2025 - Followed by Dr. Alfredo Ano     DVT prophylaxis: enoxaparin  (LOVENOX ) injection 40 mg Start: 03/19/24 2200 Code Status: DNR Family Communication: Updated daughter over the phone 6/15   Disposition Plan: Home Status is: Inpatient Remains inpatient appropriate because: IV antibiotics, need physical therapy    Antimicrobials:  Anti-infectives (From admission, onward)    Start     Dose/Rate Route Frequency Ordered Stop   03/19/24 0800  ceFEPIme  (MAXIPIME ) 2 g in sodium chloride  0.9 % 100 mL IVPB        2 g 200 mL/hr over 30 Minutes Intravenous Every 12 hours 03/19/24 0617 03/24/24 0759   03/19/24 0630  doxycycline (VIBRAMYCIN) 100 mg in sodium chloride  0.9 % 250 mL IVPB  Status:  Discontinued        100 mg 125 mL/hr over 120 Minutes Intravenous Every 12 hours 03/19/24 0617 03/19/24 0801   03/18/24 2330  cefTRIAXone  (ROCEPHIN ) 1 g in sodium chloride  0.9 % 100 mL IVPB        1 g 200 mL/hr over 30 Minutes Intravenous  Once 03/18/24 2315 03/19/24 0014   03/18/24 2330  azithromycin  (ZITHROMAX ) 500 mg in sodium chloride  0.9 % 250 mL IVPB        500 mg 250 mL/hr over 60 Minutes  Intravenous  Once 03/18/24 2315 03/19/24 0148        Objective: Vitals:   03/22/24 1143 03/22/24 1937 03/23/24 0536 03/23/24 1054  BP: 118/67 (!) 112/58 110/63 (!) 125/57  Pulse: 75 79 80 79  Resp: 18 15 13 19   Temp: (!) 97.4 F (36.3 C) 98.2 F (36.8 C) 98.3 F (36.8 C) 98 F (36.7 C)  TempSrc: Oral Oral Oral Oral  SpO2: 100% 98% 98% 99%  Weight:      Height:        Intake/Output Summary (Last 24 hours) at 03/23/2024 1257 Last data filed at 03/23/2024 0837 Gross per 24 hour  Intake 240 ml  Output 450 ml  Net -210 ml   Filed Weights   03/18/24 1548  Weight: 60.2 kg    Examination: General exam: Appears calm and comfortable  Respiratory system: Diminished breath sounds without distress. Respiratory effort normal. Cardiovascular system: S1 & S2 heard, RRR. No pedal edema. Gastrointestinal system: Abdomen is nondistended, soft and nontender. Normal bowel sounds heard. Central nervous system: Alert and oriented. Non focal exam. Speech clear  Extremities: Symmetric in appearance bilaterally  Skin: No rashes, lesions or ulcers on exposed skin    Data Reviewed: I have personally reviewed following labs and imaging studies  CBC: Recent Labs  Lab 03/18/24 1708 03/19/24 0402 03/20/24 0401 03/21/24 0415 03/22/24 0401 03/23/24 0400  WBC 18.5* 17.7* 14.5* 15.7* 14.5* 15.1*  NEUTROABS 16.4*  --  11.3*  --   --   --   HGB 10.7* 9.8* 8.8* 10.0* 9.6* 9.2*  HCT 32.3* 29.4* 27.7* 30.0* 29.6* 28.5*  MCV 97.0 97.7 98.6 95.2 99.3 96.9  PLT 312 292 299 324 348 379   Basic Metabolic Panel: Recent Labs  Lab 03/19/24 0402 03/20/24 0401 03/21/24 0415 03/22/24 0401 03/23/24 0400  NA 133* 133* 134* 134* 133*  K 4.0 3.4* 3.2* 3.7 3.6  CL 103 102 101 104 102  CO2 19* 19* 23 21* 22  GLUCOSE 99 103* 122* 92 100*  BUN 24* 20 20 27* 26*  CREATININE 0.81 0.84 0.84 0.77 0.80  CALCIUM  8.7* 8.4* 8.8* 8.9 8.8*  MG 1.8  --  1.8  --   --   PHOS 2.7  --   --   --   --     GFR: Estimated Creatinine Clearance: 46.3 mL/min (by C-G formula based on SCr of 0.8 mg/dL). Liver Function Tests: Recent Labs  Lab 03/18/24 1708 03/19/24 0402 03/20/24 0401  AST 22 19 19   ALT 19 16 17   ALKPHOS 97 88 95  BILITOT 1.2 1.1 1.0  PROT 7.5 7.0 6.6  ALBUMIN 3.3* 2.7* 2.8*   Recent Labs  Lab 03/18/24 1708  LIPASE 28   No results for input(s): AMMONIA in the last 168 hours. Coagulation Profile: No results for input(s): INR, PROTIME in the last 168 hours. Cardiac Enzymes: No results for input(s): CKTOTAL, CKMB, CKMBINDEX, TROPONINI in the last 168 hours.  BNP (last 3 results) No results for input(s): PROBNP in the last 8760 hours. HbA1C: No results for input(s): HGBA1C in the last 72 hours.  CBG: No results for input(s): GLUCAP in the last 168 hours. Lipid Profile: No results for input(s): CHOL, HDL, LDLCALC, TRIG, CHOLHDL, LDLDIRECT in the last 72 hours. Thyroid  Function Tests: No results for input(s): TSH, T4TOTAL, FREET4, T3FREE, THYROIDAB in the last 72 hours.  Anemia Panel: No results for input(s): VITAMINB12, FOLATE, FERRITIN, TIBC, IRON, RETICCTPCT in the last 72 hours. Sepsis Labs: Recent Labs  Lab 03/18/24 1707 03/19/24 0402 03/22/24 0401  PROCALCITON  --  0.36 0.12  LATICACIDVEN 1.0  --   --     Recent Results (from the past 240 hours)  Culture, blood (Routine X 2) w Reflex to ID Panel     Status: None   Collection Time: 03/18/24  5:08 PM   Specimen: BLOOD  Result Value Ref Range Status   Specimen Description   Final    BLOOD SITE NOT SPECIFIED Performed at [facility], [address] [place], [place] [address]    Special Requests   Final    BOTTLES DRAWN AEROBIC AND ANAEROBIC Blood Culture adequate volume Performed at [facility], [address], [place], [place] [address]    Culture   Final    NO GROWTH 5 DAYS Performed at [facility], [address] [place], [place] [address]    Report Status 03/23/2024 FINAL  Final  Urine Culture (for pregnant, neutropenic or urologic patients or patients with an indwelling urinary catheter)     Status: Abnormal   Collection Time: 03/19/24  2:24 AM   Specimen: Urine, Clean Catch  Result Value Ref Range Status   Specimen Description   Final    URINE, CLEAN CATCH Performed at [facility], [address] [place], [place] [address]    Special Requests   Final    NONE Performed at [facility], [address] [place], [place] [address]    Culture (A)  Final    >=100,000 COLONIES/mL AEROCOCCUS URINAE Standardized susceptibility testing for this organism is not available. 30,000 COLONIES/mL ENTEROCOCCUS FAECALIS    Report Status 03/22/2024 FINAL  Final   Organism ID, Bacteria ENTEROCOCCUS FAECALIS (A)  Final      Susceptibility   Enterococcus faecalis - MIC*    AMPICILLIN <=2 SENSITIVE Sensitive     NITROFURANTOIN <=16 SENSITIVE Sensitive     VANCOMYCIN  2 SENSITIVE Sensitive     * 30,000 COLONIES/mL ENTEROCOCCUS FAECALIS  Respiratory (~20 pathogens) panel by PCR     Status: None   Collection Time: 03/19/24  6:12 AM   Specimen: Urine, Clean Catch; Respiratory  Result Value Ref Range Status   Adenovirus NOT DETECTED NOT DETECTED Final   Coronavirus 229E NOT DETECTED NOT DETECTED Final    Comment: (NOTE) The Coronavirus on the Respiratory Panel, DOES NOT test for the novel  Coronavirus (2019 nCoV)    Coronavirus HKU1 NOT DETECTED NOT DETECTED Final   Coronavirus NL63 NOT DETECTED NOT DETECTED Final   Coronavirus OC43 NOT DETECTED NOT DETECTED Final   Metapneumovirus NOT DETECTED NOT DETECTED Final   Rhinovirus / Enterovirus NOT DETECTED NOT DETECTED Final   Influenza A NOT DETECTED NOT DETECTED Final   Influenza B NOT DETECTED NOT DETECTED Final   Parainfluenza Virus 1 NOT DETECTED NOT DETECTED Final   Parainfluenza Virus 2  NOT DETECTED NOT DETECTED Final  Parainfluenza Virus 3 NOT DETECTED NOT DETECTED Final   Parainfluenza Virus 4 NOT DETECTED NOT DETECTED Final   Respiratory Syncytial Virus NOT DETECTED NOT DETECTED Final   Bordetella pertussis NOT DETECTED NOT DETECTED Final   Bordetella Parapertussis NOT DETECTED NOT DETECTED Final   Chlamydophila pneumoniae NOT DETECTED NOT DETECTED Final   Mycoplasma pneumoniae NOT DETECTED NOT DETECTED Final    Comment: Performed at [facility], [address] [place], [place] [address]      Radiology Studies: No results found.     Scheduled Meds:  aspirin  EC  81 mg Oral Daily   atorvastatin   40 mg Oral Daily   enoxaparin  (LOVENOX ) injection  40 mg Subcutaneous Q24H   gabapentin   100 mg Oral BID   Continuous Infusions:  ceFEPime  (MAXIPIME ) IV 2 g (03/23/24 0859)     LOS: 5 days   Time spent: 25 minutes   Daren Eck, DO [facility] 03/23/2024, 12:57 PM   Available via Epic secure chat 7am-7pm After these hours, please refer to coverage provider listed on [email or web address]

## 2024-03-23 NOTE — TOC Initial Note (Signed)
 Transition of Care [facility]) - Initial/Assessment Note    Patient Details  Name: Brittany Marks MRN: [id number] Date of Birth: 25-Sep-1941  [facility]) CM/SW Contact:    Gertha Ku, LCSW Phone Number: 03/23/2024, 3:13 PM  Clinical Narrative:                  CSW spoke with the pt's daughter, Mariah Shines. She reports that the pt lives alone at home and was previously active with [facility]. She has arranged for private duty caregivers through [employer] to assist the pt at home. She also reports that the pt has a walker, cane, and is on home oxygen  through [employer]. The pt's daughter stated that she will need 24 hours' notice prior to the pt's discharge in order to arrange for caregivers to pick up and transport the pt home. She also mentioned she is waiting to hear from the hospital regarding the pt's oxygen  levels, as she was told the patient is currently on 5L, while her baseline is 3L. CSW informed the pt's daughter that the pt's RN will be notified to provide an update . CSW has confirmed that the pt is active with [facility] for home health PT, OT, RN, and aide services. Home health orders will be needed if the plan is for the pt to return home. TOC to follow.  Expected Discharge Plan: Home w Home Health Services Barriers to Discharge: Continued Medical Work up   Patient Goals and CMS Choice Patient states their goals for this hospitalization and ongoing recovery are:: retrun home CMS Medicare.gov Compare Post Acute Care list provided to:: Patient Represenative (must comment) Choice offered to / list presented to : Adult Children      Expected Discharge Plan and Services       Living arrangements for the past 2 months: Single Family Home                           HH Arranged: RN, PT, OT, Nurse's Aide HH Agency: [facility] Date [facility] Agency Contacted: 03/23/24 Time HH Agency Contacted: 1513 Representative spoke with at [facility] Agency:  Loetta Ringer  Prior Living Arrangements/Services Living arrangements for the past 2 months: Single Family Home Lives with:: Self Patient language and need for interpreter reviewed:: Yes Do you feel safe going back to the place where you live?: Yes      Need for Family Participation in Patient Care: Yes (Comment) Care giver support system in place?: Yes (comment) Current home services: Homehealth aide, Home OT, Home PT, Home RN Criminal Activity/Legal Involvement Pertinent to Current Situation/Hospitalization: No - Comment as needed  Activities of Daily Living   ADL Screening (condition at time of admission) Independently performs ADLs?: No Does the patient have a NEW difficulty with bathing/dressing/toileting/self-feeding that is expected to last >3 days?: No Does the patient have a NEW difficulty with getting in/out of bed, walking, or climbing stairs that is expected to last >3 days?: No Does the patient have a NEW difficulty with communication that is expected to last >3 days?: No Is the patient deaf or have difficulty hearing?: No Does the patient have difficulty seeing, even when wearing glasses/contacts?: No Does the patient have difficulty concentrating, remembering, or making decisions?: No  Permission Sought/Granted                  Emotional Assessment Appearance:: Appears stated age     Orientation: : Oriented  to Self, Oriented to Place      Admission diagnosis:  Hypokalemia [E87.6] Elevated troponin [R79.89] Non-STEMI (non-ST elevated myocardial infarction) (HCC) [I21.4] Change in mental status [R41.82] Generalized weakness [R53.1] Altered mental status, unspecified altered mental status type [R41.82] Multifocal pneumonia [J18.9] Patient Active Problem List   Diagnosis Date Noted   CAP (community acquired pneumonia) 03/19/2024   Acute on chronic hypoxic respiratory failure (HCC) 03/19/2024   UTI (urinary tract infection) 03/19/2024   Demand ischemia (HCC)  03/19/2024   Acute metabolic encephalopathy 03/19/2024   Hyperlipidemia 03/19/2024   Chronic systolic CHF (congestive heart failure) (HCC) 03/19/2024   Change in mental status 03/18/2024   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   Acute CVA (cerebrovascular accident) (HCC) 12/07/2023   Closed right humeral fracture 12/07/2023   Hypokalemia 12/07/2023   Tobacco abuse 12/07/2023   Leukocytosis 12/07/2023   Recurrent falls 12/07/2023   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   S/P reverse total shoulder arthroplasty, 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   Port-A-Cath in place 05/25/2019   Small cell lung cancer, 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   Goals of care, counseling/discussion 03/10/2019   Encounter for antineoplastic chemotherapy 03/10/2019   Encounter for antineoplastic immunotherapy 03/10/2019   Pathologic fracture of right humerus 02/25/2019   Lymphadenopathy, abdominal 01/02/2019   Lymphadenopathy, mediastinal 01/02/2019   Pathological fracture, left humerus, initial encounter for fracture 12/26/2018   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   PCP:  Pcp, No Pharmacy:   [id number] - [place], [place] - [address] AT [facility] [address] [place] [address] Phone: [phone] Fax: [phone]     Social Drivers of Health (SDOH) Social History: SDOH Screenings   Food Insecurity: Patient Declined (03/19/2024)  Housing: Unknown (03/19/2024)  Transportation Needs: Patient Declined (03/19/2024)  Utilities: Patient Declined (03/19/2024)  Social Connections: Unknown (03/19/2024)  Tobacco Use: High Risk (03/18/2024)   SDOH Interventions: Utilities Interventions: Patient Declined Social Connections Interventions: Patient Declined   Readmission Risk Interventions    12/08/2023   12:37 PM  Readmission Risk Prevention Plan  Post Dischage Appt Complete  Medication Screening Complete   Transportation Screening Complete

## 2024-03-24 DIAGNOSIS — J189 Pneumonia, unspecified organism: Secondary | ICD-10-CM | POA: Diagnosis not present

## 2024-03-24 LAB — CBC
HCT: 28.3 % — ABNORMAL LOW (ref 36.0–46.0)
Hemoglobin: 9.1 g/dL — ABNORMAL LOW (ref 12.0–15.0)
MCH: 31.7 pg (ref 26.0–34.0)
MCHC: 32.2 g/dL (ref 30.0–36.0)
MCV: 98.6 fL (ref 80.0–100.0)
Platelets: 386 10*3/uL (ref 150–400)
RBC: 2.87 MIL/uL — ABNORMAL LOW (ref 3.87–5.11)
RDW: 12.7 % (ref 11.5–15.5)
WBC: 18.5 10*3/uL — ABNORMAL HIGH (ref 4.0–10.5)
nRBC: 0 % (ref 0.0–0.2)

## 2024-03-24 LAB — PROCALCITONIN: Procalcitonin: <0.1 ng/mL

## 2024-03-24 MED ORDER — POLYVINYL ALCOHOL 1.4 % OP SOLN
1.0000 [drp] | OPHTHALMIC | Status: DC | PRN
  Administered 2024-03-24 – 2024-03-25 (×2): 1 [drp] via OPHTHALMIC
  Filled 2024-03-24: qty 15

## 2024-03-24 NOTE — Progress Notes (Addendum)
 PROGRESS NOTE    Brittany Marks  FAO:[id number] DOB: [birth year] DOA: 03/18/2024 PCP: Vicente Graham, No     Brief Narrative:  Brittany Marks is a 83 y.o. 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   She presented to ED due to increase in weakness and lethargy for 1 week, was noted to be more confused from baseline.  Also had hypoxemia with saturation 82% on chronic home O2.  In the emergency department, found to have UTI, pneumonia as well as demand ischemia.  New events last 24 hours / Subjective: No new issues.  Asking for eyedrops for her left eye dry.  She worked with physical therapy yesterday.  Their note states that patient required max assist for mobility to sit up and to pivot to recliner.  Called and discussed with daughter; she can have 24 hour caregiver set up starting tomorrow for short-term. Discussed with her palliative care/home hospice consideration.    Assessment & Plan: Principal Problem:   CAP (community acquired pneumonia) Active Problems:   Leukocytosis   Small cell lung cancer, right upper lobe (HCC)   Chemotherapy-induced neuropathy (HCC)   Tobacco abuse   Change in mental status   Acute on chronic hypoxic respiratory failure (HCC)   UTI (urinary tract infection)   Demand ischemia (HCC)   Acute metabolic encephalopathy   Hyperlipidemia   Chronic systolic CHF (congestive heart failure) (HCC)   Community-acquired pneumonia - CTA chest revealed superimposed interstitial and airspace opacities left greater than right - Respiratory viral panel negative - Strep pneumo antigen negative - Procalcitonin 0.36  --> 0.12 --> Repeat ordered today  - WBC 18.5 --> 17.7 --> 14.5 --> 15.7 --> 14.5 --> 15.1 --> 18.5. Afebrile. - Completed course of cefepime   Acute on chronic hypoxic respiratory failure - Requires 3 L nasal cannula O2 at baseline.  Noted desaturation to 87% in the emergency department.  Wean as able, currently on 4L O2   UTI, POA - Urine culture >100,000 aerococcus  - Completed course of cefepime   Demand ischemia - Insetting of respiratory failure, pneumonia.  Case was discussed with cardiology by ED physician, not a candidate for cath at this time.  Troponin has been elevated but flat trend 489, 546, 504, 502  Acute metabolic encephalopathy - At baseline, lives at home alone.  Encephalopathy in setting of infection and hypoxia. Resolved   Metastatic lung cancer - On palliative chemo  - Followed by Dr. Liam Redhead. Next appointment 04/21/24  - Palliative care consulted. Seen 6/14 and patient was not interested in conversation at this time. Would recommend outpatient palliative follow up.    Hyperlipidemia - Lipitor  Chemo induced neuropathy - Gabapentin   Chronic systolic heart failure - Stable  Recent fracture of right proximal humerus - Fracture Feb 2025 - Followed by Dr. Alfredo Ano     DVT prophylaxis: enoxaparin  (LOVENOX ) injection 40 mg Start: 03/19/24 2200 Code Status: DNR Family Communication: Updated daughter over the phone (she lives in [place])  Disposition Plan: Home Status is: Inpatient Remains inpatient appropriate because: She lives at home alone, daughter is setting up some caregivers.  Daughter [place] out of state. She is arranging 24 hour caregivers.    Antimicrobials:  Anti-infectives (From admission, onward)    Start     Dose/Rate Route Frequency Ordered Stop   03/19/24 0800  ceFEPIme  (MAXIPIME ) 2 g in sodium chloride  0.9 % 100 mL IVPB        2 g 200 mL/hr over 30 Minutes Intravenous Every 12 hours 03/19/24 0617 03/24/24 0718   03/19/24 0630  doxycycline  (VIBRAMYCIN) 100 mg in sodium chloride  0.9 % 250 mL IVPB  Status:  Discontinued        100 mg 125 mL/hr over 120 Minutes Intravenous Every 12 hours 03/19/24 0617 03/19/24 0801   03/18/24 2330  cefTRIAXone  (ROCEPHIN ) 1 g in sodium chloride  0.9 % 100 mL IVPB        1 g 200 mL/hr over 30 Minutes Intravenous  Once 03/18/24 2315 03/19/24 0014   03/18/24 2330  azithromycin  (ZITHROMAX ) 500 mg in sodium chloride  0.9 % 250 mL IVPB        500 mg 250 mL/hr over 60 Minutes Intravenous  Once 03/18/24 2315 03/19/24 0148        Objective: Vitals:   03/23/24 0536 03/23/24 1054 03/23/24 2021 03/24/24 0336  BP: 110/63 (!) 125/57 (!) 104/58 (!) 97/57  Pulse: 80 79 85 83  Resp: 13 19 17 18   Temp: 98.3 F (36.8 C) 98 F (36.7 C) 97.8 F (36.6 C) 98.7 F (37.1 C)  TempSrc: Oral Oral Oral Oral  SpO2: 98% 99% 96% 97%  Weight:      Height:        Intake/Output Summary (Last 24 hours) at 03/24/2024 1106 Last data filed at 03/24/2024 0900 Gross per 24 hour  Intake 290 ml  Output 650 ml  Net -360 ml   Filed Weights   03/18/24 1548  Weight: 60.2 kg    Examination: General exam: Appears calm and comfortable, left eye remains closed but able to open without issue  Respiratory system: Diminished breath sounds without distress. Respiratory effort normal. Cardiovascular system: S1 & S2 heard, RRR. No pedal edema. Gastrointestinal system: Abdomen is nondistended, soft and nontender. Normal bowel sounds heard. Central nervous system: Alert and oriented. Non focal exam. Speech clear  Extremities: Symmetric in appearance bilaterally    Data Reviewed: I have personally reviewed following labs and imaging studies  CBC: Recent Labs  Lab 03/18/24 1708 03/19/24 0402 03/20/24 0401 03/21/24 0415 03/22/24 0401 03/23/24 0400 03/24/24 0434  WBC 18.5*   < > 14.5* 15.7* 14.5* 15.1* 18.5*  NEUTROABS 16.4*  --  11.3*  --   --   --   --   HGB 10.7*   < > 8.8* 10.0* 9.6* 9.2* 9.1*  HCT 32.3*   < > 27.7*  30.0* 29.6* 28.5* 28.3*  MCV 97.0   < > 98.6 95.2 99.3 96.9 98.6  PLT 312   < > 299 324 348 379 386   < > = values in this interval not displayed.   Basic Metabolic Panel: Recent Labs  Lab 03/19/24 0402 03/20/24 0401 03/21/24 0415 03/22/24 0401 03/23/24 0400  NA 133* 133* 134* 134* 133*  K 4.0 3.4* 3.2* 3.7 3.6  CL 103 102 101 104 102  CO2 19* 19* 23 21* 22  GLUCOSE 99 103* 122* 92 100*  BUN 24* 20 20 27* 26*  CREATININE 0.81 0.84 0.84 0.77 0.80  CALCIUM  8.7* 8.4* 8.8* 8.9 8.8*  MG 1.8  --  1.8  --   --   PHOS 2.7  --   --   --   --  GFR: Estimated Creatinine Clearance: 46.3 mL/min (by C-G formula based on SCr of 0.8 mg/dL). Liver Function Tests: Recent Labs  Lab 03/18/24 1708 03/19/24 0402 03/20/24 0401  AST 22 19 19   ALT 19 16 17   ALKPHOS 97 88 95  BILITOT 1.2 1.1 1.0  PROT 7.5 7.0 6.6  ALBUMIN 3.3* 2.7* 2.8*   Recent Labs  Lab 03/18/24 1708  LIPASE 28   No results for input(s): AMMONIA in the last 168 hours. Coagulation Profile: No results for input(s): INR, PROTIME in the last 168 hours. Cardiac Enzymes: No results for input(s): CKTOTAL, CKMB, CKMBINDEX, TROPONINI in the last 168 hours. BNP (last 3 results) No results for input(s): PROBNP in the last 8760 hours. HbA1C: No results for input(s): HGBA1C in the last 72 hours.  CBG: No results for input(s): GLUCAP in the last 168 hours. Lipid Profile: No results for input(s): CHOL, HDL, LDLCALC, TRIG, CHOLHDL, LDLDIRECT in the last 72 hours. Thyroid  Function Tests: No results for input(s): TSH, T4TOTAL, FREET4, T3FREE, THYROIDAB in the last 72 hours.  Anemia Panel: No results for input(s): VITAMINB12, FOLATE, FERRITIN, TIBC, IRON, RETICCTPCT in the last 72 hours. Sepsis Labs: Recent Labs  Lab 03/18/24 1707 03/19/24 0402 03/22/24 0401  PROCALCITON  --  0.36 0.12  LATICACIDVEN 1.0  --   --     Recent Results (from the past 240 hours)   Culture, blood (Routine X 2) w Reflex to ID Panel     Status: None   Collection Time: 03/18/24  5:08 PM   Specimen: BLOOD  Result Value Ref Range Status   Specimen Description   Final    BLOOD SITE NOT SPECIFIED Performed at [facility], [address], [place], [place] [address]    Special Requests   Final    BOTTLES DRAWN AEROBIC AND ANAEROBIC Blood Culture adequate volume Performed at [facility], [address], [place], [place] [address]    Culture   Final    NO GROWTH 5 DAYS Performed at [facility], [address] [place], [place] [address]    Report Status 03/23/2024 FINAL  Final  Urine Culture (for pregnant, neutropenic or urologic patients or patients with an indwelling urinary catheter)     Status: Abnormal   Collection Time: 03/19/24  2:24 AM   Specimen: Urine, Clean Catch  Result Value Ref Range Status   Specimen Description   Final    URINE, CLEAN CATCH Performed at [facility], [address], [place], [place] [address]    Special Requests   Final    NONE Performed at [facility], [address], [place], [place] [address]    Culture (A)  Final    >=100,000 COLONIES/mL AEROCOCCUS URINAE Standardized susceptibility testing for this organism is not available. 30,000 COLONIES/mL ENTEROCOCCUS FAECALIS    Report Status 03/22/2024 FINAL  Final   Organism ID, Bacteria ENTEROCOCCUS FAECALIS (A)  Final      Susceptibility   Enterococcus faecalis - MIC*    AMPICILLIN <=2 SENSITIVE Sensitive     NITROFURANTOIN <=16 SENSITIVE Sensitive     VANCOMYCIN  2 SENSITIVE Sensitive     * 30,000 COLONIES/mL ENTEROCOCCUS FAECALIS  Respiratory (~20 pathogens) panel by PCR     Status: None   Collection Time: 03/19/24  6:12 AM   Specimen: Urine, Clean Catch; Respiratory  Result Value Ref Range Status   Adenovirus NOT DETECTED NOT DETECTED Final   Coronavirus 229E NOT DETECTED NOT  DETECTED  Final    Comment: (NOTE) The Coronavirus on the Respiratory Panel, DOES NOT test for the novel  Coronavirus (2019 nCoV)    Coronavirus HKU1 NOT DETECTED NOT DETECTED Final   Coronavirus NL63 NOT DETECTED NOT DETECTED Final   Coronavirus OC43 NOT DETECTED NOT DETECTED Final   Metapneumovirus NOT DETECTED NOT DETECTED Final   Rhinovirus / Enterovirus NOT DETECTED NOT DETECTED Final   Influenza A NOT DETECTED NOT DETECTED Final   Influenza B NOT DETECTED NOT DETECTED Final   Parainfluenza Virus 1 NOT DETECTED NOT DETECTED Final   Parainfluenza Virus 2 NOT DETECTED NOT DETECTED Final   Parainfluenza Virus 3 NOT DETECTED NOT DETECTED Final   Parainfluenza Virus 4 NOT DETECTED NOT DETECTED Final   Respiratory Syncytial Virus NOT DETECTED NOT DETECTED Final   Bordetella pertussis NOT DETECTED NOT DETECTED Final   Bordetella Parapertussis NOT DETECTED NOT DETECTED Final   Chlamydophila pneumoniae NOT DETECTED NOT DETECTED Final   Mycoplasma pneumoniae NOT DETECTED NOT DETECTED Final    Comment: Performed at [facility], [address] [place], [place] [address]      Radiology Studies: No results found.     Scheduled Meds:  aspirin  EC  81 mg Oral Daily   atorvastatin   40 mg Oral Daily   Chlorhexidine  Gluconate Cloth  6 each Topical Daily   enoxaparin  (LOVENOX ) injection  40 mg Subcutaneous Q24H   gabapentin   100 mg Oral BID   sodium chloride  flush  10-40 mL Intracatheter Q12H   Continuous Infusions:     LOS: 6 days   Time spent: 25 minutes   Daren Eck, DO [facility] 03/24/2024, 11:06 AM   Available via Epic secure chat 7am-7pm After these hours, please refer to coverage provider listed on [email or web address]

## 2024-03-24 NOTE — Progress Notes (Addendum)
 Occupational Therapy Treatment Patient Details Name: Brittany Marks MRN: [id number] DOB: 1941/06/28 Today's Date: 03/24/2024   History of present illness Ms. Stokes is a 83 yr old female admitted to the hospital with lethargy, confusion, and weakness. She was found to have PNA, UTI, acute metabolic encephalopathy, and demand ischemia. PMH: chronic respiratory failure on 3-4L O2, bilateral rotator cuff tears, metastatic lung cancer to abdomen and bone with subsequent pathologic fracture, CVA in March 2025, COPD, CHF, chemo induced neuropathy. Patient was DC from [facility] 6/9 after extended time.   OT comments  Patient is making limited progress towards goals. Patient asked for therapy to come back later. Patient was approached at agreed upon time. Patient was +2 to advance to EOB with patient putting forth 0% effort to get to EOB. Patient's best friend was present in room encouraging patient to engage in session. Patient was noted to reposition herself into a more recliner position even with repositioning x2 and education on upright posture to engage in self feeding tasks. Patient's R shoulder has h/o fracture from 3/1 and metastatic bone disease effecting BUE. Patient will benefit from continued inpatient follow up therapy, <3 hours/day.  Patient will need 24/7 caregiver support in next level of care with +2 assist. Patient's discharge plan remains appropriate at this time. OT will continue to follow acutely.        If plan is discharge home, recommend the following:  Direct supervision/assist for medications management;Assistance with cooking/housework;Assist for transportation;Help with stairs or ramp for entrance;Two people to help with walking and/or transfers;Two people to help with bathing/dressing/bathroom   Equipment Recommendations  BSC/3in1;Hospital bed;Wheelchair cushion (measurements OT);Wheelchair (measurements OT);Hoyer lift       Precautions / Restrictions Precautions Precautions:  Fall Precaution/Restrictions Comments: both shoulders limited ROM Restrictions Weight Bearing Restrictions Per Provider Order: No Other Position/Activity Restrictions: O2 dependent at baseline; uses 3-4L       Mobility Bed Mobility Overal bed mobility: Needs Assistance Bed Mobility: Rolling, Supine to Sit Rolling: Max assist         General bed mobility comments: patient did not roll as instruccted, assisted to sitting using bed pad to sit upright.        Balance Overall balance assessment: Needs assistance Sitting-balance support: No upper extremity supported, Feet supported Sitting balance-Leahy Scale: Poor Sitting balance - Comments: left lean   Standing balance support: During functional activity Standing balance-Leahy Scale: Poor           ADL either performed or assessed with clinical judgement   ADL Overall ADL's : Needs assistance/impaired                         Toilet Transfer: +2 for physical assistance;+2 for safety/equipment;Maximal assistance;Rolling walker (2 wheels) Toilet Transfer Details (indicate cue type and reason): with cues for weight shifting with increased time and encouragement from friend in room.                  Cognition Arousal: Alert Behavior During Therapy: Flat affect Cognition: Difficult to assess                               Following commands: Impaired Following commands impaired: Follows one step commands with increased time                    Pertinent Vitals/ Pain       Pain Assessment  Pain Assessment: Faces Faces Pain Scale: Hurts even more Pain Location: generalized Pain Descriptors / Indicators: Discomfort Pain Intervention(s): Limited activity within patient's tolerance         Frequency  Min 2X/week        Progress Toward Goals  OT Goals(current goals can now be found in the care plan section)  Progress towards OT goals: Not progressing toward goals - comment      Plan         AM-PAC OT 6 Clicks Daily Activity     Outcome Measure   Help from another person eating meals?: A Little Help from another person taking care of personal grooming?: A Lot Help from another person toileting, which includes using toliet, bedpan, or urinal?: Total Help from another person bathing (including washing, rinsing, drying)?: Total Help from another person to put on and taking off regular upper body clothing?: A Lot Help from another person to put on and taking off regular lower body clothing?: A Lot 6 Click Score: 11    End of Session Equipment Utilized During Treatment: Oxygen ;Gait belt;Rolling walker (2 wheels)  OT Visit Diagnosis: Muscle weakness (generalized) (M62.81);Pain Pain - Right/Left: Right Pain - part of body: Arm   Activity Tolerance Patient limited by pain;Patient limited by fatigue   Patient Left with call bell/phone within reach;in chair;with chair alarm set   Nurse Communication Mobility status        Time: 1610-9604 OT Time Calculation (min): 13 min  Charges: OT General Charges $OT Visit: 1 Visit OT Treatments $Therapeutic Activity: 8-22 mins  Wynette Heckler, MS Acute Rehabilitation Department Office# [phone]   Jame Maze 03/24/2024, 4:00 PM

## 2024-03-24 NOTE — TOC Progression Note (Signed)
 Transition of Care [facility]) - Progression Note    Patient Details  Name: Brittany Marks MRN: [id number] Date of Birth: [birth year]  Transition of Care [facility]) CM/SW Contact  Gertha Ku, LCSW Phone Number: 03/24/2024, 2:54 PM  Clinical Narrative:     CSW spoke with the pt's daughter regarding recommendations for SNF placement. The pt's daughter reported that the pt was recently discharged from rehab after completing 100 days. The pt no longer has Medicare SNF days available and would need 60 days of wellness outside of a facility to reset SNF coverage. Pt's daughter has decided to take the pt home with services from [facility], including HHPT, OT, RN, and an aide, as well as private duty care. She is requesting EMS transport home. CSW has verified the pt's address as [address], [place]. TOC to follow.   Expected Discharge Plan: Home w Home Health Services Barriers to Discharge: Continued Medical Work up  Expected Discharge Plan and Services       Living arrangements for the past 2 months: Single Family Home                           HH Arranged: RN, PT, OT, Nurse's Aide HH Agency: [facility] Date [facility] Agency Contacted: 03/23/24 Time HH Agency Contacted: 1513 Representative spoke with at [facility] Agency: Loetta Ringer   Social Determinants of Health (SDOH) Interventions SDOH Screenings   Food Insecurity: Patient Declined (03/19/2024)  Housing: Unknown (03/19/2024)  Transportation Needs: Patient Declined (03/19/2024)  Utilities: Patient Declined (03/19/2024)  Social Connections: Unknown (03/19/2024)  Tobacco Use: High Risk (03/18/2024)    Readmission Risk Interventions    12/08/2023   12:37 PM  Readmission Risk Prevention Plan  Post Dischage Appt Complete  Medication Screening Complete  Transportation Screening Complete

## 2024-03-24 NOTE — Plan of Care (Signed)
  Problem: Activity: Goal: Risk for activity intolerance will decrease Outcome: Not Progressing   Problem: Nutrition: Goal: Adequate nutrition will be maintained Outcome: Not Progressing   Problem: Activity: Goal: Ability to implement measures to reduce episodes of fatigue will improve Outcome: Not Progressing

## 2024-03-25 ENCOUNTER — Inpatient Hospital Stay (HOSPITAL_COMMUNITY)

## 2024-03-25 DIAGNOSIS — J189 Pneumonia, unspecified organism: Secondary | ICD-10-CM | POA: Diagnosis not present

## 2024-03-25 LAB — BASIC METABOLIC PANEL WITH GFR
Anion gap: 8 (ref 5–15)
BUN: 22 mg/dL (ref 8–23)
CO2: 22 mmol/L (ref 22–32)
Calcium: 8.7 mg/dL — ABNORMAL LOW (ref 8.9–10.3)
Chloride: 103 mmol/L (ref 98–111)
Creatinine, Ser: 0.74 mg/dL (ref 0.44–1.00)
GFR, Estimated: >60 mL/min (ref >60)
Glucose, Bld: 115 mg/dL — ABNORMAL HIGH (ref 70–99)
Potassium: 3.2 mmol/L — ABNORMAL LOW (ref 3.5–5.1)
Sodium: 133 mmol/L — ABNORMAL LOW (ref 135–145)

## 2024-03-25 LAB — CBC
HCT: 28.5 % — ABNORMAL LOW (ref 36.0–46.0)
Hemoglobin: 9.2 g/dL — ABNORMAL LOW (ref 12.0–15.0)
MCH: 32.1 pg (ref 26.0–34.0)
MCHC: 32.3 g/dL (ref 30.0–36.0)
MCV: 99.3 fL (ref 80.0–100.0)
Platelets: 408 10*3/uL — ABNORMAL HIGH (ref 150–400)
RBC: 2.87 MIL/uL — ABNORMAL LOW (ref 3.87–5.11)
RDW: 12.9 % (ref 11.5–15.5)
WBC: 26.3 10*3/uL — ABNORMAL HIGH (ref 4.0–10.5)
nRBC: 0 % (ref 0.0–0.2)

## 2024-03-25 MED ORDER — POTASSIUM CHLORIDE CRYS ER 20 MEQ PO TBCR
40.0000 meq | EXTENDED_RELEASE_TABLET | ORAL | Status: AC
  Administered 2024-03-25 (×2): 40 meq via ORAL
  Filled 2024-03-25 (×2): qty 2

## 2024-03-25 MED ORDER — ENSURE PLUS HIGH PROTEIN PO LIQD
237.0000 mL | Freq: Two times a day (BID) | ORAL | Status: DC
  Administered 2024-03-25 – 2024-03-26 (×2): 237 mL via ORAL

## 2024-03-25 NOTE — Progress Notes (Signed)
 Physical Therapy Treatment Patient Details Name: Brittany Marks MRN: [id number] DOB: September 24, 1941 Today's Date: 03/25/2024   History of Present Illness Brittany Marks is a 83 yr old female admitted to the hospital with lethargy, confusion, and weakness. She was found to have PNA, UTI, acute metabolic encephalopathy, and demand ischemia. PMH: chronic respiratory failure on 3-4L O2, bilateral rotator cuff tears, metastatic lung cancer to abdomen and bone with subsequent pathologic fracture, CVA in March 2025, COPD, CHF, chemo induced neuropathy. Patient was DC from [facility] 6/9 after extended time.    PT Comments  Pt AxO x 1 requiring repeat instructions and max encouragement.  Assisted OOB to recliner was difficult.  General transfer comment: assisted fro EOB to recliner 1/4 pivot steps + Max frontal asisst Bear Dance pt was bale to support her weight in upright stance but had difficulty weight shifting and turning. Positioned to comfort.   Pt will need ST Rehab at SNF to address mobility and functional decline prior to safely returning home.    If plan is discharge home, recommend the following: Two people to help with walking and/or transfers;A lot of help with bathing/dressing/bathroom;Assistance with cooking/housework;Direct supervision/assist for medications management;Assist for transportation;Help with stairs or ramp for entrance   Can travel by private vehicle        Equipment Recommendations       Recommendations for Other Services       Precautions / Restrictions Precautions Precautions: Fall Precaution/Restrictions Comments: both shoulders limited ROM Restrictions Weight Bearing Restrictions Per Provider Order: No Other Position/Activity Restrictions: O2 dependent at baseline; uses 3-4L     Mobility  Bed Mobility Overal bed mobility: Needs Assistance Bed Mobility: Rolling, Supine to Sit Rolling: Max assist   Supine to sit: Max assist     General bed mobility comments:  assisted to sitting using bed pad to sit upright + 2 assist.    Transfers Overall transfer level: Needs assistance Equipment used: None Transfers: Sit to/from Stand Sit to Stand: Max assist   Step pivot transfers: Max assist       General transfer comment: assisted fro EOB to recliner 1/4 pivot steps + Max frontal asisst Bear Dance pt was bale to support her weight in upright stance but had difficulty weight shifting and turning.    Ambulation/Gait               General Gait Details: transfers only   Stairs             Wheelchair Mobility     Tilt Bed    Modified Rankin (Stroke Patients Only)       Balance                                            Communication Communication Communication: No apparent difficulties  Cognition Arousal: Alert Behavior During Therapy: Restless, Anxious                           PT - Cognition Comments: AxO x 1 difficult to determine.  following repeat commands then at times resistant. Following commands: Impaired Following commands impaired: Follows one step commands with increased time    Cueing Cueing Techniques: Verbal cues, Tactile cues  Exercises      General Comments        Pertinent Vitals/Pain Pain Assessment Pain Assessment: Faces Faces  Pain Scale: Hurts a little bit Pain Location: generalized Pain Descriptors / Indicators: Discomfort, Guarding Pain Intervention(s): Monitored during session    Home Living                          Prior Function            PT Goals (current goals can now be found in the care plan section) Progress towards PT goals: Progressing toward goals    Frequency    Min 2X/week      PT Plan      Co-evaluation              AM-PAC PT 6 Clicks Mobility   Outcome Measure  Help needed turning from your back to your side while in a flat bed without using bedrails?: A Lot Help needed moving from lying on your back  to sitting on the side of a flat bed without using bedrails?: A Lot Help needed moving to and from a bed to a chair (including a wheelchair)?: A Lot Help needed standing up from a chair using your arms (e.g., wheelchair or bedside chair)?: Total Help needed to walk in hospital room?: Total Help needed climbing 3-5 steps with a railing? : Total 6 Click Score: 9    End of Session Equipment Utilized During Treatment: Gait belt Activity Tolerance: Patient limited by fatigue Patient left: in chair;with call bell/phone within reach   PT Visit Diagnosis: Other abnormalities of gait and mobility (R26.89);Muscle weakness (generalized) (M62.81)     Time: 1045-1100 PT Time Calculation (min) (ACUTE ONLY): 15 min  Charges:    $Therapeutic Activity: 8-22 mins PT General Charges $$ ACUTE PT VISIT: 1 Visit                     Bess Broody  PTA Acute  Rehabilitation Services Office M-F          [phone]

## 2024-03-25 NOTE — Progress Notes (Addendum)
 PROGRESS NOTE    Brittany Marks  ZOX:[id number] DOB: Jan 22, 1941 DOA: 03/18/2024 PCP: Vicente Graham, No   Brief Narrative:  Brittany Marks is a 83 y.o. 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    She presented to ED due to increase in weakness and lethargy for 1 week, was noted to be more confused from baseline.  Also had hypoxemia with saturation 82% on chronic home O2.  In the emergency department, found to have UTI, pneumonia as well as demand ischemia.  Assessment & Plan:   Principal Problem:   CAP (community acquired pneumonia) Active Problems:   Leukocytosis   Small cell lung cancer, right upper lobe (HCC)   Chemotherapy-induced neuropathy (HCC)   Tobacco abuse   Change in mental status   Acute on chronic hypoxic respiratory failure (HCC)   UTI (urinary tract infection)   Demand ischemia (HCC)   Acute metabolic encephalopathy   Hyperlipidemia   Chronic systolic CHF (congestive heart failure) (HCC)  Acute on chronic hypoxic respiratory failure secondary to community-acquired pneumonia - CTA chest revealed superimposed interstitial and airspace opacities left greater than right - Respiratory viral panel negative - Strep pneumo antigen negative - Procalcitonin 0.36 --> 0.12 -- - WBC 18.5 --> 17.7 --> 14.5 --> 15.7 --> 14.5 --> 15.1 --> 18.5> 26.2.  Was afebrile but developed fever of 100.4 overnight.  She has no respiratory complaints.  Rechecking chest x-ray, due to fever and elevated white cells, observing overnight.  Patient already completed course of cefepime . - Requires 3 L nasal cannula O2 at baseline and currently at  baseline.  Hypokalemia: Will replace.   UTI, POA - Urine culture >100,000 aerococcus  - Completed course of cefepime    Demand ischemia - Insetting of respiratory failure, pneumonia.  Case was discussed with cardiology by ED physician, not a candidate for cath at this time.  Troponin has been elevated but flat trend 489, 546, 504, 502   Acute metabolic encephalopathy - At baseline, lives at home alone.  Encephalopathy in setting of infection and hypoxia. Resolved    Metastatic lung cancer - On palliative chemo  - Followed by Dr. Liam Redhead. Next appointment 04/21/24  - Palliative care consulted. Seen 6/14 and patient was not interested in conversation at this time. Would recommend outpatient palliative follow up.     Hyperlipidemia - Lipitor   Chemo induced neuropathy - Gabapentin    Chronic systolic heart failure - Stable   Recent fracture of right proximal humerus - Fracture Feb 2025 - Followed by Dr. Alfredo Ano    DVT prophylaxis: enoxaparin  (LOVENOX ) injection 40 mg Start: 03/19/24 2200   Code Status: Limited: Do not attempt resuscitation (DNR) -DNR-LIMITED -Do Not Intubate/DNI   Family Communication:  None present at bedside.  Plan of care discussed with patient and daughter over the phone.  Status is: Inpatient Remains inpatient appropriate because: Developed low-grade fever and has elevated leukocytosis.  Needs more workup and observation overnight.   Estimated body mass index is 24.27 kg/m as calculated from the following:   Height as of this encounter: 5' 2 (1.575 m).   Weight as of this encounter: 60.2 kg.    Nutritional Assessment: Body mass index is 24.27 kg/m.Aaron Aas  Seen by dietician.  I agree with the assessment and plan as outlined below: Nutrition Status:        . Skin Assessment: I have examined the patient's skin and I agree with the wound assessment as performed by the wound care RN as outlined below:    Consultants:  None  Procedures:   None  Antimicrobials:  Anti-infectives (From admission, onward)    Start     Dose/Rate Route Frequency Ordered Stop   03/19/24 0800  ceFEPIme  (MAXIPIME ) 2 g in sodium chloride  0.9 % 100 mL IVPB        2 g 200 mL/hr over 30 Minutes Intravenous Every 12 hours 03/19/24 0617 03/24/24 0718   03/19/24 0630  doxycycline (VIBRAMYCIN) 100 mg in sodium chloride  0.9 % 250 mL IVPB  Status:  Discontinued        100 mg 125 mL/hr over 120 Minutes Intravenous Every 12 hours 03/19/24 0617 03/19/24 0801   03/18/24 2330  cefTRIAXone  (ROCEPHIN ) 1 g in sodium chloride  0.9 % 100 mL IVPB        1 g 200 mL/hr over 30 Minutes Intravenous  Once 03/18/24 2315 03/19/24 0014   03/18/24 2330  azithromycin  (ZITHROMAX ) 500 mg in sodium chloride  0.9 % 250 mL IVPB        500 mg 250 mL/hr over 60 Minutes Intravenous  Once 03/18/24 2315 03/19/24 0148         Subjective: Patient seen and examined.  She has no complaints at all.  Denies any shortness of breath.  Objective: Vitals:   03/24/24 1252 03/24/24 2033 03/25/24 0402 03/25/24 0800  BP: (!) 90/48 (!) 142/84 125/63   Pulse: 79 89 94   Resp:  19 18   Temp: 99.7 F (37.6 C) 97.8 F (36.6 C) (!) 100.4 F (38 C) 98.2 F (36.8 C)  TempSrc: Oral Oral Oral Oral  SpO2: 99% 94% 94%   Weight:      Height:        Intake/Output Summary (Last 24 hours) at 03/25/2024 0936 Last data filed at 03/25/2024 0900 Gross per 24 hour  Intake 717 ml  Output --  Net 717 ml   Filed Weights   03/18/24 1548  Weight: 60.2 kg    Examination:  General exam: Appears calm and comfortable  Respiratory system: Clear to auscultation. Respiratory effort normal. Cardiovascular system: S1 & S2 heard, RRR. No JVD, murmurs, rubs, gallops or clicks. No pedal edema. Gastrointestinal system: Abdomen is nondistended, soft and nontender. No organomegaly or masses felt. Normal bowel sounds heard. Central nervous system: Alert and oriented. No focal neurological deficits. Extremities:  Symmetric 5 x 5 power. Skin: No rashes, lesions or ulcers Psychiatry: Judgement and insight appear normal. Mood & affect appropriate.    Data Reviewed: I have personally reviewed following labs and imaging studies  CBC: Recent Labs  Lab 03/18/24 1708 03/19/24 0402 03/20/24 0401 03/21/24 0415 03/22/24 0401 03/23/24 0400 03/24/24 0434 03/25/24 0255  WBC 18.5*   < > 14.5* 15.7* 14.5* 15.1* 18.5* 26.3*  NEUTROABS 16.4*  --  11.3*  --   --   --   --   --   HGB 10.7*   < > 8.8* 10.0* 9.6* 9.2* 9.1* 9.2*  HCT 32.3*   < > 27.7* 30.0* 29.6* 28.5* 28.3* 28.5*  MCV 97.0   < > 98.6 95.2 99.3 96.9 98.6 99.3  PLT 312   < > 299 324 348 379 386 408*   < > = values  in this interval not displayed.   Basic Metabolic Panel: Recent Labs  Lab 03/19/24 0402 03/20/24 0401 03/21/24 0415 03/22/24 0401 03/23/24 0400 03/25/24 0255  NA 133* 133* 134* 134* 133* 133*  K 4.0 3.4* 3.2* 3.7 3.6 3.2*  CL 103 102 101 104 102 103  CO2 19* 19* 23 21* 22 22  GLUCOSE 99 103* 122* 92 100* 115*  BUN 24* 20 20 27* 26* 22  CREATININE 0.81 0.84 0.84 0.77 0.80 0.74  CALCIUM  8.7* 8.4* 8.8* 8.9 8.8* 8.7*  MG 1.8  --  1.8  --   --   --   PHOS 2.7  --   --   --   --   --    GFR: Estimated Creatinine Clearance: 46.3 mL/min (by C-G formula based on SCr of 0.74 mg/dL). Liver Function Tests: Recent Labs  Lab 03/18/24 1708 03/19/24 0402 03/20/24 0401  AST 22 19 19   ALT 19 16 17   ALKPHOS 97 88 95  BILITOT 1.2 1.1 1.0  PROT 7.5 7.0 6.6  ALBUMIN 3.3* 2.7* 2.8*   Recent Labs  Lab 03/18/24 1708  LIPASE 28   No results for input(s): AMMONIA in the last 168 hours. Coagulation Profile: No results for input(s): INR, PROTIME in the last 168 hours. Cardiac Enzymes: No results for input(s): CKTOTAL, CKMB, CKMBINDEX, TROPONINI in the last 168 hours. BNP (last 3 results) No results for input(s): PROBNP in the last 8760 hours. HbA1C: No results for input(s): HGBA1C in the last 72  hours. CBG: No results for input(s): GLUCAP in the last 168 hours. Lipid Profile: No results for input(s): CHOL, HDL, LDLCALC, TRIG, CHOLHDL, LDLDIRECT in the last 72 hours. Thyroid  Function Tests: No results for input(s): TSH, T4TOTAL, FREET4, T3FREE, THYROIDAB in the last 72 hours. Anemia Panel: No results for input(s): VITAMINB12, FOLATE, FERRITIN, TIBC, IRON, RETICCTPCT in the last 72 hours. Sepsis Labs: Recent Labs  Lab 03/18/24 1707 03/19/24 0402 03/22/24 0401 03/24/24 1117  PROCALCITON  --  0.36 0.12 <0.10  LATICACIDVEN 1.0  --   --   --     Recent Results (from the past 240 hours)  Culture, blood (Routine X 2) w Reflex to ID Panel     Status: None   Collection Time: 03/18/24  5:08 PM   Specimen: BLOOD  Result Value Ref Range Status   Specimen Description   Final    BLOOD SITE NOT SPECIFIED Performed at [facility], [address] [place], [place] [address]    Special Requests   Final    BOTTLES DRAWN AEROBIC AND ANAEROBIC Blood Culture adequate volume Performed at [facility], [address] [place], [place] [address]    Culture   Final    NO GROWTH 5 DAYS Performed at [facility], [address] [place], [place] [address]    Report Status 03/23/2024 FINAL  Final  Urine Culture (for pregnant, neutropenic or urologic patients or patients with an indwelling urinary catheter)     Status: Abnormal   Collection Time: 03/19/24  2:24 AM   Specimen: Urine, Clean Catch  Result Value Ref Range Status   Specimen Description   Final    URINE, CLEAN CATCH Performed at [facility] [address] [place], [place] [address]    Special Requests   Final    NONE Performed at [facility], [address], [place], [place] [address]    Culture (A)  Final    >=  100,000 COLONIES/mL AEROCOCCUS URINAE Standardized susceptibility testing for this organism is  not available. 30,000 COLONIES/mL ENTEROCOCCUS FAECALIS    Report Status 03/22/2024 FINAL  Final   Organism ID, Bacteria ENTEROCOCCUS FAECALIS (A)  Final      Susceptibility   Enterococcus faecalis - MIC*    AMPICILLIN <=2 SENSITIVE Sensitive     NITROFURANTOIN <=16 SENSITIVE Sensitive     VANCOMYCIN  2 SENSITIVE Sensitive     * 30,000 COLONIES/mL ENTEROCOCCUS FAECALIS  Respiratory (~20 pathogens) panel by PCR     Status: None   Collection Time: 03/19/24  6:12 AM   Specimen: Urine, Clean Catch; Respiratory  Result Value Ref Range Status   Adenovirus NOT DETECTED NOT DETECTED Final   Coronavirus 229E NOT DETECTED NOT DETECTED Final    Comment: (NOTE) The Coronavirus on the Respiratory Panel, DOES NOT test for the novel  Coronavirus (2019 nCoV)    Coronavirus HKU1 NOT DETECTED NOT DETECTED Final   Coronavirus NL63 NOT DETECTED NOT DETECTED Final   Coronavirus OC43 NOT DETECTED NOT DETECTED Final   Metapneumovirus NOT DETECTED NOT DETECTED Final   Rhinovirus / Enterovirus NOT DETECTED NOT DETECTED Final   Influenza A NOT DETECTED NOT DETECTED Final   Influenza B NOT DETECTED NOT DETECTED Final   Parainfluenza Virus 1 NOT DETECTED NOT DETECTED Final   Parainfluenza Virus 2 NOT DETECTED NOT DETECTED Final   Parainfluenza Virus 3 NOT DETECTED NOT DETECTED Final   Parainfluenza Virus 4 NOT DETECTED NOT DETECTED Final   Respiratory Syncytial Virus NOT DETECTED NOT DETECTED Final   Bordetella pertussis NOT DETECTED NOT DETECTED Final   Bordetella Parapertussis NOT DETECTED NOT DETECTED Final   Chlamydophila pneumoniae NOT DETECTED NOT DETECTED Final   Mycoplasma pneumoniae NOT DETECTED NOT DETECTED Final    Comment: Performed at [facility], [address] [place], [place] [address]     Radiology Studies: No results found.  Scheduled Meds:  aspirin  EC  81 mg Oral Daily   atorvastatin   40 mg Oral Daily   Chlorhexidine  Gluconate Cloth  6 each Topical Daily   enoxaparin   (LOVENOX ) injection  40 mg Subcutaneous Q24H   gabapentin   100 mg Oral BID   potassium chloride   40 mEq Oral Q4H   sodium chloride  flush  10-40 mL Intracatheter Q12H   Continuous Infusions:   LOS: 7 days   Modena Andes, MD [facility]  03/25/2024, 9:36 AM   *Please note that this is a verbal dictation therefore any spelling or grammatical errors are due to the Dragon Medical One system interpretation.  Please page via Amion and do not message via secure chat for urgent patient care matters. Secure chat can be used for non urgent patient care matters.  How to contact the [facility] Attending or Consulting provider 7A - 7P or covering provider during after hours 7P -7A, for this patient?  Check the care team in [facility] and look for a) attending/consulting [facility] provider listed and b) the [facility] team listed. Page or secure chat 7A-7P. Log into [email or web address] and use [facility]'s universal password to access. If you do not have the password, please contact the hospital operator. Locate the [facility] provider you are looking for under [facility] and page to a number that you can be directly reached. If you still have difficulty reaching the provider, please page the [facility] (Director on Call) for the Hospitalists listed on amion for assistance.

## 2024-03-25 NOTE — Plan of Care (Signed)

## 2024-03-26 DIAGNOSIS — J189 Pneumonia, unspecified organism: Secondary | ICD-10-CM | POA: Diagnosis not present

## 2024-03-26 LAB — CBC WITH DIFFERENTIAL/PLATELET
Abs Immature Granulocytes: 0.26 10*3/uL — ABNORMAL HIGH (ref 0.00–0.07)
Basophils Absolute: 0.1 10*3/uL (ref 0.0–0.1)
Basophils Relative: 1 %
Eosinophils Absolute: 0.7 10*3/uL — ABNORMAL HIGH (ref 0.0–0.5)
Eosinophils Relative: 3 %
HCT: 28.3 % — ABNORMAL LOW (ref 36.0–46.0)
Hemoglobin: 9 g/dL — ABNORMAL LOW (ref 12.0–15.0)
Immature Granulocytes: 1 %
Lymphocytes Relative: 9 %
Lymphs Abs: 2 10*3/uL (ref 0.7–4.0)
MCH: 31.7 pg (ref 26.0–34.0)
MCHC: 31.8 g/dL (ref 30.0–36.0)
MCV: 99.6 fL (ref 80.0–100.0)
Monocytes Absolute: 1.1 10*3/uL — ABNORMAL HIGH (ref 0.1–1.0)
Monocytes Relative: 5 %
Neutro Abs: 17 10*3/uL — ABNORMAL HIGH (ref 1.7–7.7)
Neutrophils Relative %: 81 %
Platelets: 420 10*3/uL — ABNORMAL HIGH (ref 150–400)
RBC: 2.84 MIL/uL — ABNORMAL LOW (ref 3.87–5.11)
RDW: 13.2 % (ref 11.5–15.5)
WBC: 21.1 10*3/uL — ABNORMAL HIGH (ref 4.0–10.5)
nRBC: 0 % (ref 0.0–0.2)

## 2024-03-26 LAB — BASIC METABOLIC PANEL WITH GFR
Anion gap: 9 (ref 5–15)
BUN: 23 mg/dL (ref 8–23)
CO2: 21 mmol/L — ABNORMAL LOW (ref 22–32)
Calcium: 8.8 mg/dL — ABNORMAL LOW (ref 8.9–10.3)
Chloride: 102 mmol/L (ref 98–111)
Creatinine, Ser: 0.79 mg/dL (ref 0.44–1.00)
GFR, Estimated: >60 mL/min (ref >60)
Glucose, Bld: 87 mg/dL (ref 70–99)
Potassium: 3.7 mmol/L (ref 3.5–5.1)
Sodium: 132 mmol/L — ABNORMAL LOW (ref 135–145)

## 2024-03-26 MED ORDER — HEPARIN SOD (PORK) LOCK FLUSH 100 UNIT/ML IV SOLN
500.0000 [IU] | INTRAVENOUS | Status: AC | PRN
  Administered 2024-03-26: 500 [IU]
  Filled 2024-03-26: qty 5

## 2024-03-26 NOTE — Plan of Care (Signed)
  Problem: Education: Goal: Knowledge of General Education information will improve Description: Including pain rating scale, medication(s)/side effects and non-pharmacologic comfort measures Outcome: Progressing   Problem: Health Behavior/Discharge Planning: Goal: Ability to manage health-related needs will improve Outcome: Progressing   Problem: Clinical Measurements: Goal: Ability to maintain clinical measurements within normal limits will improve Outcome: Progressing Goal: Will remain free from infection Outcome: Progressing Goal: Diagnostic test results will improve Outcome: Progressing Goal: Respiratory complications will improve Outcome: Progressing Goal: Cardiovascular complication will be avoided Outcome: Progressing   Problem: Activity: Goal: Risk for activity intolerance will decrease Outcome: Progressing   Problem: Nutrition: Goal: Adequate nutrition will be maintained Outcome: Progressing   Problem: Coping: Goal: Level of anxiety will decrease Outcome: Progressing   Problem: Elimination: Goal: Will not experience complications related to bowel motility Outcome: Progressing Goal: Will not experience complications related to urinary retention Outcome: Progressing   Problem: Pain Managment: Goal: General experience of comfort will improve and/or be controlled Outcome: Progressing   Problem: Safety: Goal: Ability to remain free from injury will improve Outcome: Progressing   Problem: Skin Integrity: Goal: Risk for impaired skin integrity will decrease Outcome: Progressing   Problem: Activity: Goal: Ability to tolerate increased activity will improve Outcome: Progressing   Problem: Clinical Measurements: Goal: Ability to maintain a body temperature in the normal range will improve Outcome: Progressing   Problem: Respiratory: Goal: Ability to maintain adequate ventilation will improve Outcome: Progressing Goal: Ability to maintain a clear airway  will improve Outcome: Progressing   Problem: Education: Goal: Knowledge of the prescribed therapeutic regimen will improve Outcome: Progressing   Problem: Activity: Goal: Ability to implement measures to reduce episodes of fatigue will improve Outcome: Progressing   Problem: Bowel/Gastric: Goal: Will not experience complications related to bowel motility Outcome: Progressing   Problem: Coping: Goal: Ability to identify and develop effective coping behavior will improve Outcome: Progressing   Problem: Nutritional: Goal: Maintenance of adequate nutrition will improve Outcome: Progressing

## 2024-03-26 NOTE — Discharge Summary (Signed)
 Physician Discharge Summary  Brittany Marks XBJ:[id number] DOB: 1941-01-14 DOA: 03/18/2024  PCP: Pcp, No  Admit date: 03/18/2024 Discharge date: 03/26/2024 30 Day Unplanned Readmission Risk Score    Flowsheet Row ED to Hosp-Admission (Current) from 03/18/2024 in [facility]  30 Day Unplanned Readmission Risk Score (%) 16.88 Filed at 03/26/2024 0801    This score is the patient's risk of an unplanned readmission within 30 days of being discharged (0 -100%). The score is based on dignosis, age, lab data, medications, orders, and past utilization.   Low:  0-14.9   Medium: 15-21.9   High: 22-29.9   Extreme: 30 and above          Admitted From: Home Disposition: Home  Recommendations for Outpatient Follow-up:  Follow up with PCP in 1-2 weeks Please obtain BMP/CBC in one week Please follow up with your PCP on the following pending results: Unresulted Labs (From admission, onward)    None         Home Health: Yes Equipment/Devices: None-patient already has home oxygen   Discharge Condition: Stable CODE STATUS: DNR Diet recommendation: Cardiac  Subjective: Seen and examined.  She has no complaints.  She is very happy to hear that she is going to be discharged home today.  Brief/Interim Summary: Brittany Marks is a 83 y.o. 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    She presented to ED due to increase in weakness and lethargy for 1 week, was noted to be more confused from baseline.  Also had hypoxemia with saturation 82% on chronic home O2.  In the emergency department, found to  have UTI, pneumonia as well as demand ischemia.   Acute on chronic hypoxic respiratory failure secondary to community-acquired pneumonia - CTA chest revealed superimposed interstitial and airspace opacities left greater than right - Respiratory viral panel negative - Strep pneumo antigen negative - Procalcitonin 0.36 --> 0.12 -- - WBC 18.5 --> 17.7 --> 14.5 --> 15.7 --> 14.5 --> 15.1 --> 18.5> 26.2> 21.1.  Developed a very low-grade fever 36 hours ago of 100.4, repeated chest x-ray which did not show any change in lung mass from previous chest x-ray, in fact showed some decreased consolidation at the left lower lobe compatible with resolving pneumonia or aspiration.  Has remained afebrile.  She has no respiratory complaints.  Rechecking chest x-ray, due to fever and elevated white cells, observing overnight.  Patient already completed course of cefepime . - Requires 3 L nasal cannula O2 at baseline and currently at baseline.   Hypokalemia: Resolved.   UTI, POA - Urine culture >100,000 aerococcus  - Completed course of cefepime    Demand ischemia - Insetting of respiratory failure, pneumonia.  Case was discussed with cardiology by ED physician, not a candidate for cath at this time.  Troponin has been elevated but flat trend 489, 546, 504, 502   Acute metabolic encephalopathy - At baseline, lives at home alone.  Encephalopathy in setting of infection and hypoxia. Resolved    Metastatic lung cancer - On palliative chemo  - Followed by Dr. Liam Redhead. Next appointment 04/21/24  - Palliative care consulted. Seen 6/14 and patient was not interested in conversation  at this time. Would recommend outpatient palliative follow up.     Hyperlipidemia - Lipitor   Chemo induced neuropathy - Gabapentin    Chronic systolic heart failure - Stable   Recent fracture of right proximal humerus - Fracture Feb 2025 - Followed by Dr. Alfredo Ano    Generalized weakness/disposition: Patient was seen by PT OT,  they recommended SNF.  Patient and family declined SNF, they decided to take her home.  Discharge plan was discussed with patient and/or family member and they verbalized understanding and agreed with it.  Discharge Diagnoses:  Principal Problem:   CAP (community acquired pneumonia) Active Problems:   Leukocytosis   Small cell lung cancer, right upper lobe (HCC)   Chemotherapy-induced neuropathy (HCC)   Tobacco abuse   Change in mental status   Acute on chronic hypoxic respiratory failure (HCC)   UTI (urinary tract infection)   Demand ischemia (HCC)   Acute metabolic encephalopathy   Hyperlipidemia   Chronic systolic CHF (congestive heart failure) (HCC)    Discharge Instructions   Allergies as of 03/26/2024       Reactions   Oxycodone  Other (See Comments)   Unsteady gait, fall, altered mental status        Medication List     TAKE these medications    acetaminophen  325 MG tablet Commonly known as: TYLENOL  Take 2 tablets (650 mg total) by mouth every 4 (four) hours as needed for mild pain (pain score 1-3) (or temp > 37.5 C (99.5 F)).   albuterol  108 (90 Base) MCG/ACT inhaler Commonly known as: VENTOLIN  HFA Inhale 2 puffs into the lungs every 6 (six) hours as needed for wheezing or shortness of breath.   aspirin  EC 81 MG tablet Take 1 tablet (81 mg total) by mouth daily. Swallow whole.   atorvastatin  40 MG tablet Commonly known as: LIPITOR Take 1 tablet (40 mg total) by mouth daily.   b complex vitamins tablet Take 1 tablet by mouth daily.   durvalumab  1,500 mg in sodium chloride  0.9 % 100 mL Inject 1,500 mg into the vein every 28 (twenty-eight) days.   gabapentin  100 MG capsule Commonly known as: NEURONTIN  Take 1 capsule (100 mg total) by mouth 2 (two) times daily. What changed:  when to take this additional instructions   ibuprofen 600 MG tablet Commonly known as: ADVIL Take 600 mg by mouth 2 (two) times daily as needed.   multivitamin tablet Take  1 tablet by mouth daily with breakfast.   nicotine  14 mg/24hr patch Commonly known as: NICODERM CQ  - dosed in mg/24 hours Place 14 mg onto the skin daily as needed (for the urge to smoke while hospitalized).   ondansetron  4 MG tablet Commonly known as: Zofran  Take 1 tablet (4 mg total) by mouth every 8 (eight) hours as needed for nausea or vomiting.   oxymetazoline 0.05 % nasal spray Commonly known as: AFRIN Place 2 sprays into both nostrils 2 (two) times daily as needed (for a bloody nose).   senna-docusate 8.6-50 MG tablet Commonly known as: Senokot-S Take 1 tablet by mouth at bedtime as needed for mild constipation.   VITAMIN C PO Take 1 tablet by mouth daily.        Follow-up Information     PCP Follow up in 1 week(s).                 Allergies  Allergen Reactions   Oxycodone  Other (See Comments)    Unsteady gait, fall, altered mental status  Consultations: Palliative care   Procedures/Studies: DG CHEST PORT 1 VIEW Result Date: 03/25/2024 CLINICAL DATA:  Inquired pneumonia.  Small cell lung cancer. EXAM: PORTABLE CHEST 1 VIEW COMPARISON:  03/18/2024 and CT chest 03/18/2024. FINDINGS: Patient is slightly rotated. Trachea is midline. Heart is enlarged, stable. Masslike consolidation left upper lobe, as before. Improving consolidation in the left lower lobe. Right IJ power port tip is at the SVC RA junction. IMPRESSION: 1. Left upper lobe masslike consolidation, compatible with the patient's known primary bronchogenic carcinoma and/or treatment effects. 2. Decreasing consolidation in the left lower lobe, compatible with resolving pneumonia or aspiration. Electronically Signed   By: Shearon Denis M.D.   On: 03/25/2024 11:43   CT Angio Chest PE W/Cm &/Or Wo Cm Result Date: 03/18/2024 CLINICAL DATA:  Increased weakness progressing through the week. Slight change in mentation. History of COPD and lung cancer. EXAM: CT ANGIOGRAPHY CHEST WITH CONTRAST TECHNIQUE:  Multidetector CT imaging of the chest was performed using the standard protocol during bolus administration of intravenous contrast. Multiplanar CT image reconstructions and MIPs were obtained to evaluate the vascular anatomy. RADIATION DOSE REDUCTION: This exam was performed according to the departmental dose-optimization program which includes automated exposure control, adjustment of the mA and/or kV according to patient size and/or use of iterative reconstruction technique. CONTRAST:  75mL OMNIPAQUE  IOHEXOL  350 MG/ML SOLN COMPARISON:  Same day chest radiograph and CT 03/13/2024 FINDINGS: Cardiovascular: Cardiomegaly. No pericardial effusion. Negative for acute pulmonary embolism. Coronary artery and aortic atherosclerotic calcification. Normal caliber thoracic aorta. Mediastinum/Nodes: Small hiatal hernia. Trachea is unremarkable. Increased soft tissue thickening about the left hilum now measuring 4.5 x 3.2 cm, previously 3.3 x 2.9 cm. Increased size of a pretracheal node on series 10/image 86 now measuring 1.1 cm, previously 0.7 cm. New right infrahilar lymph node measuring 1.9 cm (series 10/image 156). Lungs/Pleura: Similar pulmonary fibrosis including peripheral interstitial and reticular opacities, traction bronchiectasis, and honeycombing at the lung bases. There is superimposed interstitial and airspace opacities particularly in the left greater than right lower lobes. Trace left pleural effusion. No pneumothorax. The subpleural mass along the anterolateral left upper lobe measures 3.2 x 3.6 cm. Using similar measuring technique this measured 3.1 x 2.8 cm on 03/13/2024. The adjacent left upper lobe nodule on 4/33 is unchanged and again measures 1.2 cm. Upper Abdomen: No acute abnormality. Musculoskeletal: Left reverse TSA. Advanced arthritis right shoulder. No acute fracture. No destructive osseous lesion. Review of the MIP images confirms the above findings. IMPRESSION: 1. Negative for acute pulmonary  embolism. 2. Similar pulmonary fibrosis with superimposed interstitial and airspace opacities particularly in the left greater than right lower lobes. Favor superimposed pneumonia. 3. Slightly increased size of the subpleural mass along the anterolateral left upper lobe, left infrahilar mass, and mediastinal lymphadenopathy since 03/13/2024. Given short interval increase this may be related to pneumonia. Rapidly progressive malignancy/metastatic disease is not excluded. 4. Aortic Atherosclerosis (ICD10-I70.0). Electronically Signed   By: Rozell Cornet M.D.   On: 03/18/2024 23:00   CT Head Wo Contrast Result Date: 03/18/2024 CLINICAL DATA:  Mental status change, unknown cause EXAM: CT HEAD WITHOUT CONTRAST TECHNIQUE: Contiguous axial images were obtained from the base of the skull through the vertex without intravenous contrast. RADIATION DOSE REDUCTION: This exam was performed according to the departmental dose-optimization program which includes automated exposure control, adjustment of the mA and/or kV according to patient size and/or use of iterative reconstruction technique. COMPARISON:  December 07, 2023 FINDINGS: Brain: The ventricles appear age appropriate. No  mass effect or midline shift. Gray-white differentiation is preserved.Confluent periventricular and subcortical white matter hypoattenuation, most consistent with changes of severe chronic ischemic microvascular disease.No evidence of acute territorial infarction, extra-axial fluid collection, hemorrhage, or mass lesion. Remote lacunar infarct within the left basal ganglia. The basilar cisterns are patent without downward herniation. The cerebellar hemispheres and vermis are well formed without mass lesion or focal attenuation abnormality. Vascular: No hyperdense vessel. Calcified atherosclerotic plaque within the cavernous/supraclinoid ICA and intradural vertebral arteries. Skull: The skull base is especially degraded by patient motion. Negative for  fracture or focal lesion. Sinuses/Orbits: Degraded by patient motion. The paranasal sinuses and mastoids are clear.No retrobulbar hematoma. Other: None. IMPRESSION: 1. No acute intracranial abnormality, specifically, no acute hemorrhage, territorial infarction, or intracranial mass. 2. Similar cerebral volume loss with sequelae of advanced chronic ischemic microvascular disease. Electronically Signed   By: Rance Burrows M.D.   On: 03/18/2024 18:48   DG Chest Port 1 View Result Date: 03/18/2024 CLINICAL DATA:  Shortness of breath, weakness. EXAM: PORTABLE CHEST 1 VIEW COMPARISON:  December 26, 2018. FINDINGS: Stable cardiomediastinal silhouette. Status post left shoulder arthroplasty. Right internal jugular Port-A-Cath is noted with distal tip in expected position of cavoatrial junction. Mild diffuse reticular densities are noted throughout both lungs concerning for edema or atypical inflammation. Small left pleural effusion is noted. IMPRESSION: Mild diffuse reticular densities are noted throughout both lungs concerning for edema or atypical inflammation. Small left pleural effusion is noted. Electronically Signed   By: Rosalene Colon M.D.   On: 03/18/2024 16:43   CT Chest Wo Contrast Result Date: 03/13/2024 CLINICAL DATA:  Small cell lung cancer restaging. EXAM: CT CHEST, ABDOMEN AND PELVIS WITHOUT CONTRAST TECHNIQUE: Multidetector CT imaging of the chest, abdomen and pelvis was performed following the standard protocol without IV contrast. RADIATION DOSE REDUCTION: This exam was performed according to the departmental dose-optimization program which includes automated exposure control, adjustment of the mA and/or kV according to patient size and/or use of iterative reconstruction technique. COMPARISON:  CT dated 09/17/2023. FINDINGS: Evaluation of this exam is limited in the absence of intravenous contrast. CT CHEST FINDINGS Cardiovascular: There is no cardiomegaly or pericardial effusion. Three-vessel  coronary vascular calcification and calcification of the mitral annulus. Advanced atherosclerotic calcification of the thoracic aorta. No aneurysmal dilatation. The central pulmonary arteries are grossly unremarkable. Mediastinum/Nodes: No obvious hilar adenopathy. Evaluation however is limited in the absence of intravenous contrast. The esophagus is grossly unremarkable. Right-sided Port-A-Cath with tip at the cavoatrial junction. Lungs/Pleura: There is diffuse interstitial coarsening and bibasilar dominant subpleural reticulation in keeping with interstitial lung disease. Redemonstration of subpleural radiation fibrosis in the anterior left upper lobe. There has been interval increase in the size of the subpleural masslike density along the inferior aspect of the radiation fibrosis in the left upper lobe measuring approximately 3.3 x 2.5 cm (previously 2.3 x 2.0 cm) additional nodular density in the anterior left upper lobe measures 1.2 x 1.0 cm (46/4) which also has progressed since the prior CT. No pleural effusion or pneumothorax. The central airways are patent. Musculoskeletal: Osteopenia with degenerative changes of spine. Total left shoulder arthroplasty. No acute osseous pathology. CT ABDOMEN PELVIS FINDINGS No intra-abdominal free air or free fluid. Hepatobiliary: The liver is unremarkable. No biliary ductal dilatation. The gallbladder is unremarkable. Pancreas: Unremarkable. No pancreatic ductal dilatation or surrounding inflammatory changes. Spleen: Normal in size without focal abnormality. Adrenals/Urinary Tract: The adrenal glands are unremarkable. There is right extrarenal pelvis with moderate pelvicaliectasis, decreased  since the prior CT. The left kidney is unremarkable. The visualized ureters and urinary bladder appear unremarkable. Stomach/Bowel: Moderate stool throughout the colon. There is sigmoid diverticulosis. No bowel obstruction or active inflammation. The appendix is not visualized with  certainty. No inflammatory changes identified in the right lower quadrant. Vascular/Lymphatic: Advanced aortoiliac atherosclerotic disease. The IVC is unremarkable. No portal venous gas. There is no adenopathy. Reproductive: Hysterectomy.  No suspicious adnexal masses. Other: None Musculoskeletal: Osteopenia with degenerative changes of the spine. No acute osseous pathology. IMPRESSION: 1. Interval increase in the size of the subpleural masslike density along the inferior aspect of the radiation fibrosis in the left upper lobe concerning for progression of malignancy. Additional nodular density in the anterior left upper lobe also has progressed since the prior CT. PET-CT may provide better evaluation. 2. No evidence of metastatic disease in the abdomen or pelvis. 3. Sigmoid diverticulosis. No bowel obstruction. 4. Right extrarenal pelvis with moderate pelvicaliectasis, decreased since the prior CT. 5.  Aortic Atherosclerosis (ICD10-I70.0). Electronically Signed   By: Angus Bark M.D.   On: 03/13/2024 15:08   CT ABDOMEN PELVIS WO CONTRAST Result Date: 03/13/2024 CLINICAL DATA:  Small cell lung cancer restaging. EXAM: CT CHEST, ABDOMEN AND PELVIS WITHOUT CONTRAST TECHNIQUE: Multidetector CT imaging of the chest, abdomen and pelvis was performed following the standard protocol without IV contrast. RADIATION DOSE REDUCTION: This exam was performed according to the departmental dose-optimization program which includes automated exposure control, adjustment of the mA and/or kV according to patient size and/or use of iterative reconstruction technique. COMPARISON:  CT dated 09/17/2023. FINDINGS: Evaluation of this exam is limited in the absence of intravenous contrast. CT CHEST FINDINGS Cardiovascular: There is no cardiomegaly or pericardial effusion. Three-vessel coronary vascular calcification and calcification of the mitral annulus. Advanced atherosclerotic calcification of the thoracic aorta. No aneurysmal  dilatation. The central pulmonary arteries are grossly unremarkable. Mediastinum/Nodes: No obvious hilar adenopathy. Evaluation however is limited in the absence of intravenous contrast. The esophagus is grossly unremarkable. Right-sided Port-A-Cath with tip at the cavoatrial junction. Lungs/Pleura: There is diffuse interstitial coarsening and bibasilar dominant subpleural reticulation in keeping with interstitial lung disease. Redemonstration of subpleural radiation fibrosis in the anterior left upper lobe. There has been interval increase in the size of the subpleural masslike density along the inferior aspect of the radiation fibrosis in the left upper lobe measuring approximately 3.3 x 2.5 cm (previously 2.3 x 2.0 cm) additional nodular density in the anterior left upper lobe measures 1.2 x 1.0 cm (46/4) which also has progressed since the prior CT. No pleural effusion or pneumothorax. The central airways are patent. Musculoskeletal: Osteopenia with degenerative changes of spine. Total left shoulder arthroplasty. No acute osseous pathology. CT ABDOMEN PELVIS FINDINGS No intra-abdominal free air or free fluid. Hepatobiliary: The liver is unremarkable. No biliary ductal dilatation. The gallbladder is unremarkable. Pancreas: Unremarkable. No pancreatic ductal dilatation or surrounding inflammatory changes. Spleen: Normal in size without focal abnormality. Adrenals/Urinary Tract: The adrenal glands are unremarkable. There is right extrarenal pelvis with moderate pelvicaliectasis, decreased since the prior CT. The left kidney is unremarkable. The visualized ureters and urinary bladder appear unremarkable. Stomach/Bowel: Moderate stool throughout the colon. There is sigmoid diverticulosis. No bowel obstruction or active inflammation. The appendix is not visualized with certainty. No inflammatory changes identified in the right lower quadrant. Vascular/Lymphatic: Advanced aortoiliac atherosclerotic disease. The IVC is  unremarkable. No portal venous gas. There is no adenopathy. Reproductive: Hysterectomy.  No suspicious adnexal masses. Other: None Musculoskeletal: Osteopenia with  degenerative changes of the spine. No acute osseous pathology. IMPRESSION: 1. Interval increase in the size of the subpleural masslike density along the inferior aspect of the radiation fibrosis in the left upper lobe concerning for progression of malignancy. Additional nodular density in the anterior left upper lobe also has progressed since the prior CT. PET-CT may provide better evaluation. 2. No evidence of metastatic disease in the abdomen or pelvis. 3. Sigmoid diverticulosis. No bowel obstruction. 4. Right extrarenal pelvis with moderate pelvicaliectasis, decreased since the prior CT. 5.  Aortic Atherosclerosis (ICD10-I70.0). Electronically Signed   By: Angus Bark M.D.   On: 03/13/2024 15:08     Discharge Exam: Vitals:   03/26/24 0104 03/26/24 0550  BP: (!) 121/50 119/62  Pulse: 73 75  Resp: 16 16  Temp: 98.6 F (37 C) (!) 97.4 F (36.3 C)  SpO2: 100% 99%   Vitals:   03/25/24 2101 03/25/24 2102 03/26/24 0104 03/26/24 0550  BP: (!) 96/41 (!) 96/49 (!) 121/50 119/62  Pulse: 77 77 73 75  Resp:  18 16 16   Temp:  98.6 F (37 C) 98.6 F (37 C) (!) 97.4 F (36.3 C)  TempSrc:      SpO2:  95% 100% 99%  Weight:      Height:        General: Pt is alert, awake, not in acute distress Cardiovascular: RRR, S1/S2 +, no rubs, no gallops Respiratory: CTA bilaterally, no wheezing, no rhonchi Abdominal: Soft, NT, ND, bowel sounds + Extremities: no edema, no cyanosis    The results of significant diagnostics from this hospitalization (including imaging, microbiology, ancillary and laboratory) are listed below for reference.     Microbiology: Recent Results (from the past 240 hours)  Culture, blood (Routine X 2) w Reflex to ID Panel     Status: None   Collection Time: 03/18/24  5:08 PM   Specimen: BLOOD  Result Value  Ref Range Status   Specimen Description   Final    BLOOD SITE NOT SPECIFIED Performed at [facility], [address] [place], [place] [address]    Special Requests   Final    BOTTLES DRAWN AEROBIC AND ANAEROBIC Blood Culture adequate volume Performed at [facility], [address] [place], [place] [address]    Culture   Final    NO GROWTH 5 DAYS Performed at [facility], [address] [place], [place] [address]    Report Status 03/23/2024 FINAL  Final  Urine Culture (for pregnant, neutropenic or urologic patients or patients with an indwelling urinary catheter)     Status: Abnormal   Collection Time: 03/19/24  2:24 AM   Specimen: Urine, Clean Catch  Result Value Ref Range Status   Specimen Description   Final    URINE, CLEAN CATCH Performed at [facility], [address] [place], [place] [address]    Special Requests   Final    NONE Performed at [facility], [address] [place], [place] [address]    Culture (A)  Final    >=100,000 COLONIES/mL AEROCOCCUS URINAE Standardized susceptibility testing for this organism is not available. 30,000 COLONIES/mL ENTEROCOCCUS FAECALIS    Report Status 03/22/2024 FINAL  Final   Organism ID, Bacteria ENTEROCOCCUS FAECALIS (A)  Final      Susceptibility   Enterococcus faecalis - MIC*    AMPICILLIN <=2 SENSITIVE Sensitive     NITROFURANTOIN <=16 SENSITIVE Sensitive     VANCOMYCIN  2  SENSITIVE Sensitive     * 30,000 COLONIES/mL ENTEROCOCCUS FAECALIS  Respiratory (~20 pathogens) panel by PCR     Status: None   Collection Time: 03/19/24  6:12 AM   Specimen: Urine, Clean Catch; Respiratory  Result Value Ref Range Status   Adenovirus NOT DETECTED NOT DETECTED Final   Coronavirus 229E NOT DETECTED NOT DETECTED Final    Comment: (NOTE) The Coronavirus on the Respiratory Panel, DOES NOT test for the novel  Coronavirus (2019 nCoV)    Coronavirus HKU1  NOT DETECTED NOT DETECTED Final   Coronavirus NL63 NOT DETECTED NOT DETECTED Final   Coronavirus OC43 NOT DETECTED NOT DETECTED Final   Metapneumovirus NOT DETECTED NOT DETECTED Final   Rhinovirus / Enterovirus NOT DETECTED NOT DETECTED Final   Influenza A NOT DETECTED NOT DETECTED Final   Influenza B NOT DETECTED NOT DETECTED Final   Parainfluenza Virus 1 NOT DETECTED NOT DETECTED Final   Parainfluenza Virus 2 NOT DETECTED NOT DETECTED Final   Parainfluenza Virus 3 NOT DETECTED NOT DETECTED Final   Parainfluenza Virus 4 NOT DETECTED NOT DETECTED Final   Respiratory Syncytial Virus NOT DETECTED NOT DETECTED Final   Bordetella pertussis NOT DETECTED NOT DETECTED Final   Bordetella Parapertussis NOT DETECTED NOT DETECTED Final   Chlamydophila pneumoniae NOT DETECTED NOT DETECTED Final   Mycoplasma pneumoniae NOT DETECTED NOT DETECTED Final    Comment: Performed at [facility], [address], [place], [place] [address]     Labs: BNP (last 3 results) No results for input(s): BNP in the last 8760 hours. Basic Metabolic Panel: Recent Labs  Lab 03/21/24 0415 03/22/24 0401 03/23/24 0400 03/25/24 0255 03/26/24 0328  NA 134* 134* 133* 133* 132*  K 3.2* 3.7 3.6 3.2* 3.7  CL 101 104 102 103 102  CO2 23 21* 22 22 21*  GLUCOSE 122* 92 100* 115* 87  BUN 20 27* 26* 22 23  CREATININE 0.84 0.77 0.80 0.74 0.79  CALCIUM  8.8* 8.9 8.8* 8.7* 8.8*  MG 1.8  --   --   --   --    Liver Function Tests: Recent Labs  Lab 03/20/24 0401  AST 19  ALT 17  ALKPHOS 95  BILITOT 1.0  PROT 6.6  ALBUMIN 2.8*   No results for input(s): LIPASE, AMYLASE in the last 168 hours. No results for input(s): AMMONIA in the last 168 hours. CBC: Recent Labs  Lab 03/20/24 0401 03/21/24 0415 03/22/24 0401 03/23/24 0400 03/24/24 0434 03/25/24 0255 03/26/24 0328  WBC 14.5*   < > 14.5* 15.1* 18.5* 26.3* 21.1*  NEUTROABS 11.3*  --   --   --   --   --  17.0*  HGB 8.8*   < > 9.6* 9.2* 9.1* 9.2*  9.0*  HCT 27.7*   < > 29.6* 28.5* 28.3* 28.5* 28.3*  MCV 98.6   < > 99.3 96.9 98.6 99.3 99.6  PLT 299   < > 348 379 386 408* 420*   < > = values in this interval not displayed.   Cardiac Enzymes: No results for input(s): CKTOTAL, CKMB, CKMBINDEX, TROPONINI in the last 168 hours. BNP: Invalid input(s): POCBNP CBG: No results for input(s): GLUCAP in the last 168 hours. D-Dimer No results for input(s): DDIMER in the last 72 hours. Hgb A1c No results for input(s): HGBA1C in the last 72 hours. Lipid Profile No results for input(s): CHOL, HDL, LDLCALC, TRIG, CHOLHDL, LDLDIRECT in the last 72 hours. Thyroid  function studies No results for input(s): TSH,  T4TOTAL, T3FREE, THYROIDAB in the last 72 hours.  Invalid input(s): FREET3 Anemia work up No results for input(s): VITAMINB12, FOLATE, FERRITIN, TIBC, IRON, RETICCTPCT in the last 72 hours. Urinalysis    Component Value Date/Time   COLORURINE YELLOW 03/19/2024 0224   APPEARANCEUR HAZY (A) 03/19/2024 0224   LABSPEC 1.038 (H) 03/19/2024 0224   PHURINE 6.0 03/19/2024 0224   GLUCOSEU NEGATIVE 03/19/2024 0224   HGBUR NEGATIVE 03/19/2024 0224   BILIRUBINUR NEGATIVE 03/19/2024 0224   KETONESUR 5 (A) 03/19/2024 0224   PROTEINUR 30 (A) 03/19/2024 0224   UROBILINOGEN 0.2 07/23/2013 1332   NITRITE NEGATIVE 03/19/2024 0224   LEUKOCYTESUR TRACE (A) 03/19/2024 0224   Sepsis Labs Recent Labs  Lab 03/23/24 0400 03/24/24 0434 03/25/24 0255 03/26/24 0328  WBC 15.1* 18.5* 26.3* 21.1*   Microbiology Recent Results (from the past 240 hours)  Culture, blood (Routine X 2) w Reflex to ID Panel     Status: None   Collection Time: 03/18/24  5:08 PM   Specimen: BLOOD  Result Value Ref Range Status   Specimen Description   Final    BLOOD SITE NOT SPECIFIED Performed at [facility], [address], [place], [place] [address]    Special Requests   Final    BOTTLES DRAWN  AEROBIC AND ANAEROBIC Blood Culture adequate volume Performed at [facility], [address] [place], [place] [address]    Culture   Final    NO GROWTH 5 DAYS Performed at [facility], [address], [place], [place] [address]    Report Status 03/23/2024 FINAL  Final  Urine Culture (for pregnant, neutropenic or urologic patients or patients with an indwelling urinary catheter)     Status: Abnormal   Collection Time: 03/19/24  2:24 AM   Specimen: Urine, Clean Catch  Result Value Ref Range Status   Specimen Description   Final    URINE, CLEAN CATCH Performed at [facility], [address], [place], [place] [address]    Special Requests   Final    NONE Performed at [facility], [address] [place], [place] [address]    Culture (A)  Final    >=100,000 COLONIES/mL AEROCOCCUS URINAE Standardized susceptibility testing for this organism is not available. 30,000 COLONIES/mL ENTEROCOCCUS FAECALIS    Report Status 03/22/2024 FINAL  Final   Organism ID, Bacteria ENTEROCOCCUS FAECALIS (A)  Final      Susceptibility   Enterococcus faecalis - MIC*    AMPICILLIN <=2 SENSITIVE Sensitive     NITROFURANTOIN <=16 SENSITIVE Sensitive     VANCOMYCIN  2 SENSITIVE Sensitive     * 30,000 COLONIES/mL ENTEROCOCCUS FAECALIS  Respiratory (~20 pathogens) panel by PCR     Status: None   Collection Time: 03/19/24  6:12 AM   Specimen: Urine, Clean Catch; Respiratory  Result Value Ref Range Status   Adenovirus NOT DETECTED NOT DETECTED Final   Coronavirus 229E NOT DETECTED NOT DETECTED Final    Comment: (NOTE) The Coronavirus on the Respiratory Panel, DOES NOT test for the novel  Coronavirus (2019 nCoV)    Coronavirus HKU1 NOT DETECTED NOT DETECTED Final   Coronavirus NL63 NOT DETECTED NOT DETECTED Final   Coronavirus OC43 NOT DETECTED NOT DETECTED Final   Metapneumovirus NOT DETECTED NOT DETECTED Final   Rhinovirus / Enterovirus  NOT DETECTED NOT DETECTED Final   Influenza A NOT DETECTED NOT DETECTED Final   Influenza B NOT DETECTED NOT DETECTED Final  Parainfluenza Virus 1 NOT DETECTED NOT DETECTED Final   Parainfluenza Virus 2 NOT DETECTED NOT DETECTED Final   Parainfluenza Virus 3 NOT DETECTED NOT DETECTED Final   Parainfluenza Virus 4 NOT DETECTED NOT DETECTED Final   Respiratory Syncytial Virus NOT DETECTED NOT DETECTED Final   Bordetella pertussis NOT DETECTED NOT DETECTED Final   Bordetella Parapertussis NOT DETECTED NOT DETECTED Final   Chlamydophila pneumoniae NOT DETECTED NOT DETECTED Final   Mycoplasma pneumoniae NOT DETECTED NOT DETECTED Final    Comment: Performed at [facility], [address], [place], [place] [address]    FURTHER DISCHARGE INSTRUCTIONS:   Get Medicines reviewed and adjusted: Please take all your medications with you for your next visit with your Primary MD   Laboratory/radiological data: Please request your Primary MD to go over all hospital tests and procedure/radiological results at the follow up, please ask your Primary MD to get all Hospital records sent to his/her office.   In some cases, they will be blood work, cultures and biopsy results pending at the time of your discharge. Please request that your primary care M.D. goes through all the records of your hospital data and follows up on these results.   Also Note the following: If you experience worsening of your admission symptoms, develop shortness of breath, life threatening emergency, suicidal or homicidal thoughts you must seek medical attention immediately by calling 911 or calling your MD immediately  if symptoms less severe.   You must read complete instructions/literature along with all the possible adverse reactions/side effects for all the Medicines you take and that have been prescribed to you. Take any new Medicines after you have completely understood and accpet all the possible adverse reactions/side  effects.    patient was instructed, not to drive, operate heavy machinery, perform activities at heights, swimming or participation in water  activities or provide baby-sitting services while on Pain, Sleep and Anxiety Medications; until their outpatient Physician has advised to do so again. Also recommended to not to take more than prescribed Pain, Sleep and Anxiety Medications.  It is not advisable to combine anxiety, sleep and pain medications without talking with your primary care provider.     Wear Seat belts while driving.   Please note: You were cared for by a hospitalist during your hospital stay. Once you are discharged, your primary care physician will handle any further medical issues. Please note that NO REFILLS for any discharge medications will be authorized once you are discharged, as it is imperative that you return to your primary care physician (or establish a relationship with a primary care physician if you do not have one) for your post hospital discharge needs so that they can reassess your need for medications and monitor your lab values  Time coordinating discharge: Over 30 minutes  SIGNED:   Modena Andes, MD  [facility] 03/26/2024, 10:37 AM *Please note that this is a verbal dictation therefore any spelling or grammatical errors are due to the Dragon Medical One system interpretation. If 7PM-7AM, please contact night-coverage [email or web address]

## 2024-03-26 NOTE — Care Management Important Message (Signed)
 Important Message  Patient Details IM Letter mailed to [facility] Daughter  Name: Brittany Marks MRN: [id number] Date of Birth: 31-Aug-1941   Important Message Given:  Yes - Medicare IM     [name] 03/26/2024, 11:45 AM

## 2024-03-26 NOTE — TOC Transition Note (Signed)
 Transition of Care [facility]) - Discharge Note   Patient Details  Name: Brittany Marks MRN: [id number] Date of Birth: 12-30-1940  Transition of Care [facility]) CM/SW Contact:  Marty Sleet, LCSW Phone Number: 03/26/2024, 11:58 AM   Clinical Narrative:    Pt to return home w/ private caregivers through [facility]. Pt will receive HHPT/OT/RN/Aide services through [facility]. HH orders have been placed. Spoke with pt's daughter and confirmed discharge plans. DC packet with signed DNR placed at RN station. PTAR called at 11:52am.   Final next level of care: Home w Home Health Services Barriers to Discharge: Barriers Resolved   Patient Goals and CMS Choice Patient states their goals for this hospitalization and ongoing recovery are:: retrun home CMS Medicare.gov Compare Post Acute Care list provided to:: Patient Represenative (must comment) Choice offered to / list presented to : Adult Children      Discharge Placement                Patient to be transferred to facility by: PTAR Name of family member notified: Daughter, Antonette Kitchens Patient and family notified of of transfer: 03/26/24  Discharge Plan and Services Additional resources added to the After Visit Summary for                  DME Arranged: N/A DME Agency: NA       HH Arranged: RN, PT, OT, Nurse's Aide HH Agency: [facility] Date [facility] Agency Contacted: 03/23/24 Time HH Agency Contacted: [id number] Representative spoke with at [facility] Agency: Loetta Ringer  Social Drivers of Health (SDOH) Interventions SDOH Screenings   Food Insecurity: Patient Declined (03/19/2024)  Housing: Unknown (03/19/2024)  Transportation Needs: Patient Declined (03/19/2024)  Utilities: Patient Declined (03/19/2024)  Social Connections: Unknown (03/19/2024)  Tobacco Use: High Risk (03/18/2024)     Readmission Risk Interventions    03/26/2024   11:55 AM 12/08/2023   12:37 PM  Readmission Risk Prevention Plan  Post Dischage Appt  Complete   Medication Screening  Complete  Transportation Screening Complete Complete  PCP or Specialist Appt within 5-7 Days Complete   Home Care Screening Complete   Medication Review (RN CM) Complete

## 2024-04-01 ENCOUNTER — Telehealth: Payer: Self-pay

## 2024-04-01 NOTE — Telephone Encounter (Signed)
 Spoke with patient's daughter, Brittany Marks, this afternoon regarding concerns about the patient's care. She shared that the patient was recently hospitalized with pneumonia and has since been discharged. Patient is currently receiving 24 hour in-home care from caregivers, PT, OT, a nurse, and aides.   Brittany Marks expressed concerns about whether assisted living, palliative care, or hospice may be appropriate for her mother at this stage. Advised her to continue with the current care plan through the caregivers and [facility], and to re-evaluate the patient's needs during the upcoming appointment with Dr. Sherrod on 04/21/24. Daughter voiced understanding.

## 2024-04-14 ENCOUNTER — Telehealth: Payer: Self-pay | Admitting: Medical Oncology

## 2024-04-14 NOTE — Telephone Encounter (Signed)
 Pt now resides at [facility] in [place].

## 2024-04-21 ENCOUNTER — Inpatient Hospital Stay

## 2024-04-21 ENCOUNTER — Inpatient Hospital Stay (HOSPITAL_BASED_OUTPATIENT_CLINIC_OR_DEPARTMENT_OTHER): Admitting: Internal Medicine

## 2024-04-21 ENCOUNTER — Inpatient Hospital Stay: Attending: Internal Medicine

## 2024-04-21 VITALS — BP 98/71 | HR 78 | Temp 98.2°F | Resp 16 | Ht 62.0 in

## 2024-04-21 DIAGNOSIS — C778 Secondary and unspecified malignant neoplasm of lymph nodes of multiple regions: Secondary | ICD-10-CM | POA: Diagnosis not present

## 2024-04-21 DIAGNOSIS — Z8673 Personal history of transient ischemic attack (TIA), and cerebral infarction without residual deficits: Secondary | ICD-10-CM | POA: Insufficient documentation

## 2024-04-21 DIAGNOSIS — C3411 Malignant neoplasm of upper lobe, right bronchus or lung: Secondary | ICD-10-CM

## 2024-04-21 DIAGNOSIS — Z95828 Presence of other vascular implants and grafts: Secondary | ICD-10-CM

## 2024-04-21 DIAGNOSIS — K5900 Constipation, unspecified: Secondary | ICD-10-CM | POA: Diagnosis not present

## 2024-04-21 DIAGNOSIS — Z5112 Encounter for antineoplastic immunotherapy: Secondary | ICD-10-CM | POA: Diagnosis not present

## 2024-04-21 DIAGNOSIS — Z9221 Personal history of antineoplastic chemotherapy: Secondary | ICD-10-CM | POA: Diagnosis not present

## 2024-04-21 DIAGNOSIS — Z7982 Long term (current) use of aspirin: Secondary | ICD-10-CM | POA: Diagnosis not present

## 2024-04-21 DIAGNOSIS — S42309D Unspecified fracture of shaft of humerus, unspecified arm, subsequent encounter for fracture with routine healing: Secondary | ICD-10-CM | POA: Diagnosis not present

## 2024-04-21 DIAGNOSIS — I517 Cardiomegaly: Secondary | ICD-10-CM | POA: Diagnosis not present

## 2024-04-21 DIAGNOSIS — Z8701 Personal history of pneumonia (recurrent): Secondary | ICD-10-CM | POA: Diagnosis not present

## 2024-04-21 DIAGNOSIS — C7951 Secondary malignant neoplasm of bone: Secondary | ICD-10-CM | POA: Diagnosis not present

## 2024-04-21 DIAGNOSIS — Z9981 Dependence on supplemental oxygen: Secondary | ICD-10-CM | POA: Diagnosis not present

## 2024-04-21 DIAGNOSIS — Z79899 Other long term (current) drug therapy: Secondary | ICD-10-CM | POA: Diagnosis not present

## 2024-04-21 LAB — CMP (CANCER CENTER ONLY)
ALT: 8 U/L (ref 0–44)
AST: 16 U/L (ref 15–41)
Albumin: 3.1 g/dL — ABNORMAL LOW (ref 3.5–5.0)
Alkaline Phosphatase: 77 U/L (ref 38–126)
Anion gap: 5 (ref 5–15)
BUN: 13 mg/dL (ref 8–23)
CO2: 29 mmol/L (ref 22–32)
Calcium: 9 mg/dL (ref 8.9–10.3)
Chloride: 106 mmol/L (ref 98–111)
Creatinine: 0.84 mg/dL (ref 0.44–1.00)
GFR, Estimated: >60 mL/min (ref >60)
Glucose, Bld: 110 mg/dL — ABNORMAL HIGH (ref 70–99)
Potassium: 3.2 mmol/L — ABNORMAL LOW (ref 3.5–5.1)
Sodium: 140 mmol/L (ref 135–145)
Total Bilirubin: 0.5 mg/dL (ref 0.0–1.2)
Total Protein: 6.7 g/dL (ref 6.5–8.1)

## 2024-04-21 LAB — CBC WITH DIFFERENTIAL (CANCER CENTER ONLY)
Abs Immature Granulocytes: 0.04 K/uL (ref 0.00–0.07)
Basophils Absolute: 0.1 K/uL (ref 0.0–0.1)
Basophils Relative: 1 %
Eosinophils Absolute: 0.7 K/uL — ABNORMAL HIGH (ref 0.0–0.5)
Eosinophils Relative: 6 %
HCT: 31.2 % — ABNORMAL LOW (ref 36.0–46.0)
Hemoglobin: 10.4 g/dL — ABNORMAL LOW (ref 12.0–15.0)
Immature Granulocytes: 0 %
Lymphocytes Relative: 11 %
Lymphs Abs: 1.1 K/uL (ref 0.7–4.0)
MCH: 31.4 pg (ref 26.0–34.0)
MCHC: 33.3 g/dL (ref 30.0–36.0)
MCV: 94.3 fL (ref 80.0–100.0)
Monocytes Absolute: 0.8 K/uL (ref 0.1–1.0)
Monocytes Relative: 8 %
Neutro Abs: 7.9 K/uL — ABNORMAL HIGH (ref 1.7–7.7)
Neutrophils Relative %: 74 %
Platelet Count: 289 K/uL (ref 150–400)
RBC: 3.31 MIL/uL — ABNORMAL LOW (ref 3.87–5.11)
RDW: 13.1 % (ref 11.5–15.5)
WBC Count: 10.6 K/uL — ABNORMAL HIGH (ref 4.0–10.5)
nRBC: 0 % (ref 0.0–0.2)

## 2024-04-21 MED ORDER — SODIUM CHLORIDE 0.9 % IV SOLN
1500.0000 mg | Freq: Once | INTRAVENOUS | Status: AC
  Administered 2024-04-21: 1500 mg via INTRAVENOUS
  Filled 2024-04-21: qty 30

## 2024-04-21 MED ORDER — SODIUM CHLORIDE 0.9% FLUSH
10.0000 mL | INTRAVENOUS | Status: DC | PRN
  Administered 2024-04-21: 10 mL via INTRAVENOUS

## 2024-04-21 MED ORDER — SODIUM CHLORIDE 0.9 % IV SOLN: Freq: Once | INTRAVENOUS | Status: AC

## 2024-04-21 NOTE — Progress Notes (Signed)
 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   PRIOR THERAPY: None   CURRENT THERAPY: Palliative systemic chemotherapy with carboplatin  for AUC of 5 on day 1, etoposide  100 mg/M2 on days 1, 2 and 3 as well as Imfinzi  1500 mg every 3 weeks with the chemotherapy and Neulasta  support. First dose on 03/16/2019. Status post 66 cycles.   Starting from cycle #5 the patient will be treated with maintenance treatment with Imfinzi  1500 mg IV every 4 weeks.  INTERVAL HISTORY: Brittany Marks 83 y.o. female returns to clinic today for follow-up visit.Discussed the use of AI scribe software for clinical note transcription with the patient, who gave verbal consent to proceed.  History of Present Illness Brittany Marks is an 83 year old female with extensive stage small cell lung cancer who presents for follow-up after hospitalization for pneumonia. She is accompanied by her daughter, Brittany Marks.  She has a history of extensive stage small cell lung cancer diagnosed in May 2020. Initially, she received palliative systemic chemotherapy with carboplatin , camautoposide, and Imfinzi  every three weeks for four cycles. Since cycle five, she has been on maintenance treatment with single-agent Imfinzi  every four weeks, completing 66 cycles.  Recently, she was hospitalized due to pneumonia following a fall at home that resulted in a fractured arm and a short-term rehabilitation stay in [place]. During the hospital visit, her oxygen  levels were low, necessitating home oxygen  therapy. She is currently on 2 liters of oxygen , although she  required up to 4 liters during her pneumonia episode. Imaging during her hospital stay showed some enlargement of nodules and lymph nodes.  She missed one cycle of her cancer treatment in June due to her recent health issues. Despite these challenges, she feels fine today and is frustrated with the nasal cannula for oxygen . No new symptoms were reported other than breathing issues.    MEDICAL HISTORY: Past Medical History:  Diagnosis Date   Acute CVA (cerebrovascular accident) (HCC) 12/07/2023   Cataract    Bilateral   Constipation    pain medication   Left rotator cuff tear    Right rotator cuff tear    SCL CA dx'd 11/2018    ALLERGIES:  is allergic to oxycodone .  MEDICATIONS:  Current Outpatient Medications  Medication Sig Dispense Refill   acetaminophen  (TYLENOL ) 325 MG tablet Take 2 tablets (650 mg total) by mouth every 4 (four) hours as needed for mild pain (pain score 1-3) (or temp > 37.5 C (99.5 F)).     albuterol  (VENTOLIN  HFA) 108 (90 Base) MCG/ACT inhaler Inhale 2 puffs into the lungs every 6 (six) hours as needed for wheezing or shortness of breath. 8 g 2   Ascorbic Acid (VITAMIN C PO) Take 1 tablet by mouth daily.     aspirin  EC 81 MG tablet Take 1 tablet (81 mg total) by mouth daily. Swallow whole.     atorvastatin  (LIPITOR) 40 MG tablet Take 1 tablet (40 mg total) by mouth daily.     b complex vitamins tablet Take 1 tablet by mouth daily.     durvalumab  1,500 mg in sodium chloride   0.9 % 100 mL Inject 1,500 mg into the vein every 28 (twenty-eight) days.     gabapentin  (NEURONTIN ) 100 MG capsule Take 1 capsule (100 mg total) by mouth 2 (two) times daily. (Patient taking differently: Take 100 mg by mouth See admin instructions. Take 100 mg by mouth in the morning and evening) 90 capsule 2   ibuprofen (ADVIL) 600 MG tablet Take 600 mg by mouth 2 (two) times daily as needed.     Multiple Vitamin (MULTIVITAMIN) tablet Take 1 tablet by mouth daily with breakfast.     nicotine   (NICODERM CQ  - DOSED IN MG/24 HOURS) 14 mg/24hr patch Place 14 mg onto the skin daily as needed (for the urge to smoke while hospitalized).     ondansetron  (ZOFRAN ) 4 MG tablet Take 1 tablet (4 mg total) by mouth every 8 (eight) hours as needed for nausea or vomiting. 10 tablet 0   oxymetazoline (AFRIN) 0.05 % nasal spray Place 2 sprays into both nostrils 2 (two) times daily as needed (for a bloody nose).     senna-docusate (SENOKOT-S) 8.6-50 MG tablet Take 1 tablet by mouth at bedtime as needed for mild constipation.     No current facility-administered medications for this visit.   Facility-Administered Medications Ordered in Other Visits  Medication Dose Route Frequency Provider Last Rate Last Admin   heparin  lock flush 100 unit/mL  500 Units Intracatheter Once PRN Magrinat, Sandria BROCKS, MD       sodium chloride  flush (NS) 0.9 % injection 10 mL  10 mL Intracatheter PRN Magrinat, Sandria BROCKS, MD        SURGICAL HISTORY:  Past Surgical History:  Procedure Laterality Date   CATARACT EXTRACTION W/ INTRAOCULAR LENS  IMPLANT, BILATERAL  2012   DECOMPRESSIVE LUMBAR LAMINECTOMY LEVEL 2 N/A 07/29/2013   Procedure: LUMBAR LAMINECTOMY, DECOMPRESSION LUMBAR THREE TO FOUR, FOUR TO FIVE microdiscectomy l3,4 right;  Surgeon: Tanda DELENA Heading, MD;  Location: [facility];  Service: Orthopedics;  Laterality: N/A;   I & D SUPERIOR RIGHT SHOULDER AND CLOSURE WOUND  01-10-2011   S/P ROTATOR CUFF REPAIR   IR IMAGING GUIDED PORT INSERTION  04/09/2019   LUMBAR LAMINECTOMY  1970'S   LUMBAR LAMINECTOMY/DECOMPRESSION MICRODISCECTOMY N/A 06/27/2016   Procedure: L1 - L2 DISCECTOMY;  Surgeon: Donaciano Sprang, MD;  Location: [facility];  Service: Orthopedics;  Laterality: N/A;   LUMBAR SPINE SURGERY  1983   REVERSE SHOULDER ARTHROPLASTY Left 03/10/2020   Procedure: REVERSE SHOULDER ARTHROPLASTY;  Surgeon: Melita Drivers, MD;  Location: [facility];  Service: Orthopedics;  Laterality: Left;    RIGHT SHOULDER ARTHROSCOPY/ OPEN DISTAL  CLAVICLE RESECTION/ SAD/ OPEN ROTATOR CUFF REPAIR  11-28-2010   SHOULDER FUSION SURGERY  03/11/2020   Shoulder Surgery , hardware placed   SHOULDER OPEN ROTATOR CUFF REPAIR  12/19/2011   Procedure: ROTATOR CUFF REPAIR SHOULDER OPEN;  Surgeon: Lynwood SHAUNNA Bern, MD;  Location: [facility];  Service: Orthopedics;  Laterality: Right;  RIGHT RECURRENT OPEN REPAIR OF THE ROTATOR CUFF WITH TISSUE MEND GRAFTANTERIOR CHROMIOECTOMY   VAGINAL HYSTERECTOMY  1979    REVIEW OF SYSTEMS:  Constitutional: positive for fatigue Eyes: negative Ears, nose, mouth, throat, and face: negative Respiratory: positive for dyspnea on exertion Cardiovascular: negative Gastrointestinal: negative Genitourinary:negative Integument/breast: negative Hematologic/lymphatic: negative Musculoskeletal:positive for arthralgias and muscle weakness Neurological: negative Behavioral/Psych: negative Endocrine: negative Allergic/Immunologic: negative   PHYSICAL EXAMINATION: General appearance: alert, cooperative, fatigued, and no distress Head: Normocephalic, without obvious abnormality, atraumatic Neck: no adenopathy, no JVD, supple, symmetrical,  trachea midline, and thyroid  not enlarged, symmetric, no tenderness/mass/nodules Lymph nodes: Cervical, supraclavicular, and axillary nodes normal. Resp: clear to auscultation bilaterally Back: symmetric, no curvature. ROM normal. No CVA tenderness. Cardio: regular rate and rhythm, S1, S2 normal, no murmur, click, rub or gallop GI: soft, non-tender; bowel sounds normal; no masses,  no organomegaly Extremities: extremities normal, atraumatic, no cyanosis or edema Neurologic: Alert and oriented X 3, normal strength and tone. Normal symmetric reflexes. Normal coordination and gait   ECOG PERFORMANCE STATUS: 1 - Symptomatic but completely ambulatory  Blood pressure 98/71, pulse 78, temperature 98.2 F (36.8 C), temperature source Temporal, resp. rate 16, height 5' 2  (1.575 m), SpO2 98%.  LABORATORY DATA: Lab Results  Component Value Date   WBC 10.6 (H) 04/21/2024   HGB 10.4 (L) 04/21/2024   HCT 31.2 (L) 04/21/2024   MCV 94.3 04/21/2024   PLT 289 04/21/2024      Chemistry      Component Value Date/Time   NA 132 (L) 03/26/2024 0328   K 3.7 03/26/2024 0328   CL 102 03/26/2024 0328   CO2 21 (L) 03/26/2024 0328   BUN 23 03/26/2024 0328   CREATININE 0.79 03/26/2024 0328   CREATININE 0.68 02/26/2024 1306      Component Value Date/Time   CALCIUM  8.8 (L) 03/26/2024 0328   ALKPHOS 95 03/20/2024 0401   AST 19 03/20/2024 0401   AST 22 02/26/2024 1306   ALT 17 03/20/2024 0401   ALT 21 02/26/2024 1306   BILITOT 1.0 03/20/2024 0401   BILITOT 0.6 02/26/2024 1306       RADIOGRAPHIC STUDIES: DG CHEST PORT 1 VIEW Result Date: 03/25/2024 CLINICAL DATA:  Inquired pneumonia.  Small cell lung cancer. EXAM: PORTABLE CHEST 1 VIEW COMPARISON:  03/18/2024 and CT chest 03/18/2024. FINDINGS: Patient is slightly rotated. Trachea is midline. Heart is enlarged, stable. Masslike consolidation left upper lobe, as before. Improving consolidation in the left lower lobe. Right IJ power port tip is at the SVC RA junction. IMPRESSION: 1. Left upper lobe masslike consolidation, compatible with the patient's known primary bronchogenic carcinoma and/or treatment effects. 2. Decreasing consolidation in the left lower lobe, compatible with resolving pneumonia or aspiration. Electronically Signed   By: Newell Eke M.D.   On: 03/25/2024 11:43      ASSESSMENT AND PLAN: This is a very pleasant 83 years old white female with extensive stage small cell lung cancer and she is currently undergoing treatment with carboplatin , etoposide  and Imfinzi  status post 5 cycles.  Starting from cycle #6 the patient is on maintenance treatment with single agent Imfinzi  every 4 weeks status post 66 cycles of total treatment. The patient has been tolerating this treatment fairly well. She had  repeat CT angiogram of the chest during her recent hospitalization and that showed mild increase in the size of the subpleural mass along the anterior lateral left upper lobe, left infrahilar mass and mediastinal lymphadenopathy suspicious to be related to the pneumonia rather than rapidly progressive malignancy. Assessment and Plan Assessment & Plan Extensive stage small cell lung cancer Diagnosed in May 2020, currently on maintenance treatment with Imfinzi  every four weeks after initial chemotherapy. Recent imaging showed nodule and lymph node enlargement likely due to recent pneumonia rather than cancer progression. Remarkable response to treatment, surviving over five years, which is rare for this condition. - Administer Imfinzi  today - Resume treatment cycle - Schedule follow-up appointment in four weeks  Pneumonia Recent hospitalization for pneumonia following a fall and arm fracture.  Required increased oxygen  support during pneumonia episode. Imaging suggested nodule and lymph node enlargement likely due to pneumonia rather than cancer progression.  Oxygen  dependence Currently on home oxygen  therapy at 2 liters, previously increased to 4 liters during pneumonia episode. She expresses discomfort with nasal cannula.  Fracture of arm Sustained arm fracture following a fall at home, leading to hospitalization and subsequent short-term rehabilitation. The patient was advised to call immediately if she has any concerning symptoms in the interval.  The patient voices understanding of current disease status and treatment options and is in agreement with the current care plan.  All questions were answered. The patient knows to call the clinic with any problems, questions or concerns. We can certainly see the patient much sooner if necessary. The total time spent in the appointment was 30 minutes.  Disclaimer: This note was dictated with voice recognition software. Similar sounding words can  inadvertently be transcribed and may not be corrected upon review.

## 2024-04-23 ENCOUNTER — Other Ambulatory Visit: Payer: Self-pay

## 2024-04-23 ENCOUNTER — Telehealth: Payer: Self-pay | Admitting: Medical Oncology

## 2024-04-23 NOTE — Telephone Encounter (Signed)
 Requests PT/OT order for evaluation and treatment at [facility]. She had inpt PT/OT .

## 2024-04-23 NOTE — Telephone Encounter (Signed)
 err

## 2024-04-27 ENCOUNTER — Other Ambulatory Visit: Payer: Self-pay

## 2024-05-04 ENCOUNTER — Telehealth: Payer: Self-pay

## 2024-05-04 NOTE — Telephone Encounter (Signed)
 T/C received from [facility] with [facility] asking for referral for pt for Physical Therapy. 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 She is currently at [facility]  Please advise

## 2024-05-05 NOTE — Telephone Encounter (Signed)
 Verbal order given to Jon Rogue, Physical Therapist with [facility].  She said she did not need anything faxed, she can just take a verbal order.

## 2024-05-11 DIAGNOSIS — G629 Polyneuropathy, unspecified: Secondary | ICD-10-CM | POA: Insufficient documentation

## 2024-05-14 DIAGNOSIS — F17210 Nicotine dependence, cigarettes, uncomplicated: Secondary | ICD-10-CM | POA: Insufficient documentation

## 2024-05-19 ENCOUNTER — Inpatient Hospital Stay (HOSPITAL_BASED_OUTPATIENT_CLINIC_OR_DEPARTMENT_OTHER): Admitting: Internal Medicine

## 2024-05-19 ENCOUNTER — Inpatient Hospital Stay

## 2024-05-19 ENCOUNTER — Inpatient Hospital Stay: Attending: Internal Medicine

## 2024-05-19 VITALS — BP 131/81 | HR 91 | Temp 98.4°F | Resp 16 | Ht 62.0 in | Wt 113.0 lb

## 2024-05-19 DIAGNOSIS — Z9221 Personal history of antineoplastic chemotherapy: Secondary | ICD-10-CM | POA: Diagnosis not present

## 2024-05-19 DIAGNOSIS — C3411 Malignant neoplasm of upper lobe, right bronchus or lung: Secondary | ICD-10-CM

## 2024-05-19 DIAGNOSIS — K5900 Constipation, unspecified: Secondary | ICD-10-CM | POA: Insufficient documentation

## 2024-05-19 DIAGNOSIS — C778 Secondary and unspecified malignant neoplasm of lymph nodes of multiple regions: Secondary | ICD-10-CM | POA: Diagnosis not present

## 2024-05-19 DIAGNOSIS — R109 Unspecified abdominal pain: Secondary | ICD-10-CM | POA: Insufficient documentation

## 2024-05-19 DIAGNOSIS — R634 Abnormal weight loss: Secondary | ICD-10-CM | POA: Diagnosis not present

## 2024-05-19 DIAGNOSIS — Z8673 Personal history of transient ischemic attack (TIA), and cerebral infarction without residual deficits: Secondary | ICD-10-CM | POA: Insufficient documentation

## 2024-05-19 DIAGNOSIS — Z5112 Encounter for antineoplastic immunotherapy: Secondary | ICD-10-CM | POA: Insufficient documentation

## 2024-05-19 DIAGNOSIS — Z79899 Other long term (current) drug therapy: Secondary | ICD-10-CM | POA: Insufficient documentation

## 2024-05-19 DIAGNOSIS — Z7982 Long term (current) use of aspirin: Secondary | ICD-10-CM | POA: Diagnosis not present

## 2024-05-19 DIAGNOSIS — Z95828 Presence of other vascular implants and grafts: Secondary | ICD-10-CM

## 2024-05-19 DIAGNOSIS — C7951 Secondary malignant neoplasm of bone: Secondary | ICD-10-CM | POA: Diagnosis not present

## 2024-05-19 LAB — CMP (CANCER CENTER ONLY)
ALT: 13 U/L (ref 0–44)
AST: 22 U/L (ref 15–41)
Albumin: 3.2 g/dL — ABNORMAL LOW (ref 3.5–5.0)
Alkaline Phosphatase: 86 U/L (ref 38–126)
Anion gap: 8 (ref 5–15)
BUN: 27 mg/dL — ABNORMAL HIGH (ref 8–23)
CO2: 25 mmol/L (ref 22–32)
Calcium: 8.7 mg/dL — ABNORMAL LOW (ref 8.9–10.3)
Chloride: 103 mmol/L (ref 98–111)
Creatinine: 1.03 mg/dL — ABNORMAL HIGH (ref 0.44–1.00)
GFR, Estimated: 54 mL/min — ABNORMAL LOW (ref >60)
Glucose, Bld: 121 mg/dL — ABNORMAL HIGH (ref 70–99)
Potassium: 3.7 mmol/L (ref 3.5–5.1)
Sodium: 136 mmol/L (ref 135–145)
Total Bilirubin: 0.6 mg/dL (ref 0.0–1.2)
Total Protein: 7 g/dL (ref 6.5–8.1)

## 2024-05-19 LAB — CBC WITH DIFFERENTIAL (CANCER CENTER ONLY)
Abs Immature Granulocytes: 0.03 K/uL (ref 0.00–0.07)
Basophils Absolute: 0.1 K/uL (ref 0.0–0.1)
Basophils Relative: 1 %
Eosinophils Absolute: 0.2 K/uL (ref 0.0–0.5)
Eosinophils Relative: 2 %
HCT: 31.9 % — ABNORMAL LOW (ref 36.0–46.0)
Hemoglobin: 10.8 g/dL — ABNORMAL LOW (ref 12.0–15.0)
Immature Granulocytes: 0 %
Lymphocytes Relative: 9 %
Lymphs Abs: 1.1 K/uL (ref 0.7–4.0)
MCH: 30.8 pg (ref 26.0–34.0)
MCHC: 33.9 g/dL (ref 30.0–36.0)
MCV: 90.9 fL (ref 80.0–100.0)
Monocytes Absolute: 1.1 K/uL — ABNORMAL HIGH (ref 0.1–1.0)
Monocytes Relative: 10 %
Neutro Abs: 9.1 K/uL — ABNORMAL HIGH (ref 1.7–7.7)
Neutrophils Relative %: 78 %
Platelet Count: 345 K/uL (ref 150–400)
RBC: 3.51 MIL/uL — ABNORMAL LOW (ref 3.87–5.11)
RDW: 13.4 % (ref 11.5–15.5)
WBC Count: 11.7 K/uL — ABNORMAL HIGH (ref 4.0–10.5)
nRBC: 0 % (ref 0.0–0.2)

## 2024-05-19 MED ORDER — SODIUM CHLORIDE 0.9 % IV SOLN
Freq: Once | INTRAVENOUS | Status: AC
Start: 2024-05-19 — End: 2024-05-19

## 2024-05-19 MED ORDER — SODIUM CHLORIDE 0.9 % IV SOLN
1500.0000 mg | Freq: Once | INTRAVENOUS | Status: AC
  Administered 2024-05-19 (×2): 1500 mg via INTRAVENOUS
  Filled 2024-05-19: qty 30

## 2024-05-19 MED ORDER — SODIUM CHLORIDE 0.9% FLUSH
10.0000 mL | INTRAVENOUS | Status: DC | PRN
  Administered 2024-05-19 (×2): 10 mL via INTRAVENOUS

## 2024-05-19 MED ORDER — SODIUM CHLORIDE 0.9% FLUSH
10.0000 mL | INTRAVENOUS | Status: DC | PRN
Start: 2024-05-19 — End: 2024-05-19
  Administered 2024-05-19 (×2): 10 mL

## 2024-05-19 NOTE — Progress Notes (Signed)
 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   PRIOR THERAPY: None   CURRENT THERAPY: Palliative systemic chemotherapy with carboplatin  for AUC of 5 on day 1, etoposide  100 mg/M2 on days 1, 2 and 3 as well as Imfinzi  1500 mg every 3 weeks with the chemotherapy and Neulasta  support. First dose on 03/16/2019. Status post 67 cycles.   Starting from cycle #5 the patient will be treated with maintenance treatment with Imfinzi  1500 mg IV every 4 weeks.  INTERVAL HISTORY: SERENITEE FUERTES 83 y.o. female returns to clinic today for follow-up visit.Discussed the use of AI scribe software for clinical note transcription with the patient, who gave verbal consent to proceed.  History of Present Illness Ginette Bradway is an 83 year old female with extensive stage small cell lung cancer who presents for follow-up and treatment.  She has been diagnosed with extensive stage small cell lung cancer since May 2020 and has been undergoing palliative systemic chemo-immunotherapy with carboplatin , etoposide , and Imfinzi  every three weeks. She reports that she has been receiving treatment for her lung cancer since May 2020.  She has been experiencing stomach pain for about a week, located in the gastric area. No acid reflux, gas, constipation, diarrhea, nausea, or vomiting. She reports recent weight loss but does not specify the amount. She is not on home oxygen  and denies any breathing issues beyond her baseline, as well as no chest pain or hemoptysis.  She resides in a facility and is dissatisfied with the food provided there,  preferring to cook herself. Her daughter lives in [place]  and is not able to bring her food regularly.   MEDICAL HISTORY: Past Medical History:  Diagnosis Date   Acute CVA (cerebrovascular accident) (HCC) 12/07/2023   Cataract    Bilateral   Constipation    pain medication   Left rotator cuff tear    Right rotator cuff tear    SCL CA dx'd 11/2018    ALLERGIES:  is allergic to oxycodone .  MEDICATIONS:  Current Outpatient Medications  Medication Sig Dispense Refill   acetaminophen  (TYLENOL ) 325 MG tablet Take 2 tablets (650 mg total) by mouth every 4 (four) hours as needed for mild pain (pain score 1-3) (or temp > 37.5 C (99.5 F)).     albuterol  (VENTOLIN  HFA) 108 (90 Base) MCG/ACT inhaler Inhale 2 puffs into the lungs every 6 (six) hours as needed for wheezing or shortness of breath. 8 g 2   Ascorbic Acid (VITAMIN C PO) Take 1 tablet by mouth daily.     aspirin  EC 81 MG tablet Take 1 tablet (81 mg total) by mouth daily. Swallow whole.     atorvastatin  (LIPITOR) 40 MG tablet Take 1 tablet (40 mg total) by mouth daily.     b complex vitamins tablet Take 1 tablet by mouth daily.     durvalumab  1,500 mg in sodium chloride  0.9 % 100 mL Inject 1,500 mg into the vein every 28 (twenty-eight) days.     gabapentin  (NEURONTIN ) 100 MG capsule Take 1 capsule (100 mg total) by mouth 2 (two) times daily. (Patient taking differently: Take  100 mg by mouth See admin instructions. Take 100 mg by mouth in the morning and evening) 90 capsule 2   ibuprofen (ADVIL) 600 MG tablet Take 600 mg by mouth 2 (two) times daily as needed.     Multiple Vitamin (MULTIVITAMIN) tablet Take 1 tablet by mouth daily with breakfast.     nicotine  (NICODERM CQ  - DOSED IN MG/24 HOURS) 14 mg/24hr patch Place 14 mg onto the skin daily as needed (for the urge to smoke while hospitalized).     ondansetron  (ZOFRAN ) 4 MG tablet Take 1 tablet (4 mg total) by mouth every 8 (eight) hours as needed for nausea or vomiting. 10 tablet 0    oxymetazoline (AFRIN) 0.05 % nasal spray Place 2 sprays into both nostrils 2 (two) times daily as needed (for a bloody nose).     senna-docusate (SENOKOT-S) 8.6-50 MG tablet Take 1 tablet by mouth at bedtime as needed for mild constipation.     No current facility-administered medications for this visit.   Facility-Administered Medications Ordered in Other Visits  Medication Dose Route Frequency Provider Last Rate Last Admin   heparin  lock flush 100 unit/mL  500 Units Intracatheter Once PRN Magrinat, Sandria BROCKS, MD       sodium chloride  flush (NS) 0.9 % injection 10 mL  10 mL Intracatheter PRN Magrinat, Sandria BROCKS, MD       sodium chloride  flush (NS) 0.9 % injection 10 mL  10 mL Intravenous PRN Heilingoetter, Cassandra L, PA-C   10 mL at 05/19/24 1141    SURGICAL HISTORY:  Past Surgical History:  Procedure Laterality Date   CATARACT EXTRACTION W/ INTRAOCULAR LENS  IMPLANT, BILATERAL  2012   DECOMPRESSIVE LUMBAR LAMINECTOMY LEVEL 2 N/A 07/29/2013   Procedure: LUMBAR LAMINECTOMY, DECOMPRESSION LUMBAR THREE TO FOUR, FOUR TO FIVE microdiscectomy l3,4 right;  Surgeon: Tanda DELENA Heading, MD;  Location: [facility];  Service: Orthopedics;  Laterality: N/A;   I & D SUPERIOR RIGHT SHOULDER AND CLOSURE WOUND  01-10-2011   S/P ROTATOR CUFF REPAIR   IR IMAGING GUIDED PORT INSERTION  04/09/2019   LUMBAR LAMINECTOMY  1970'S   LUMBAR LAMINECTOMY/DECOMPRESSION MICRODISCECTOMY N/A 06/27/2016   Procedure: L1 - L2 DISCECTOMY;  Surgeon: Donaciano Sprang, MD;  Location: [facility];  Service: Orthopedics;  Laterality: N/A;   LUMBAR SPINE SURGERY  1983   REVERSE SHOULDER ARTHROPLASTY Left 03/10/2020   Procedure: REVERSE SHOULDER ARTHROPLASTY;  Surgeon: Melita Drivers, MD;  Location: [facility];  Service: Orthopedics;  Laterality: Left;    RIGHT SHOULDER ARTHROSCOPY/ OPEN DISTAL CLAVICLE RESECTION/ SAD/ OPEN ROTATOR CUFF REPAIR  11-28-2010   SHOULDER FUSION SURGERY  03/11/2020   Shoulder Surgery , hardware placed   SHOULDER OPEN  ROTATOR CUFF REPAIR  12/19/2011   Procedure: ROTATOR CUFF REPAIR SHOULDER OPEN;  Surgeon: Lynwood SHAUNNA Bern, MD;  Location:  SURGERY CENTER;  Service: Orthopedics;  Laterality: Right;  RIGHT RECURRENT OPEN REPAIR OF THE ROTATOR CUFF WITH TISSUE MEND GRAFTANTERIOR CHROMIOECTOMY   VAGINAL HYSTERECTOMY  1979    REVIEW OF SYSTEMS:  A comprehensive review of systems was negative except for: Constitutional: positive for fatigue Respiratory: positive for dyspnea on exertion Gastrointestinal: positive for abdominal pain   PHYSICAL EXAMINATION: General appearance: alert, cooperative, fatigued, and no distress Head: Normocephalic, without obvious abnormality, atraumatic Neck: no adenopathy, no JVD, supple, symmetrical, trachea midline, and thyroid  not enlarged, symmetric, no tenderness/mass/nodules Lymph nodes: Cervical, supraclavicular, and axillary nodes normal. Resp: clear to auscultation bilaterally Back: symmetric, no curvature. ROM normal. No CVA  tenderness. Cardio: regular rate and rhythm, S1, S2 normal, no murmur, click, rub or gallop GI: soft, non-tender; bowel sounds normal; no masses,  no organomegaly Extremities: extremities normal, atraumatic, no cyanosis or edema   ECOG PERFORMANCE STATUS: 1 - Symptomatic but completely ambulatory  Blood pressure 131/81, pulse 91, temperature 98.4 F (36.9 C), temperature source Temporal, resp. rate 16, height 5' 2 (1.575 m), weight 113 lb (51.3 kg), SpO2 99%.  LABORATORY DATA: Lab Results  Component Value Date   WBC 11.7 (H) 05/19/2024   HGB 10.8 (L) 05/19/2024   HCT 31.9 (L) 05/19/2024   MCV 90.9 05/19/2024   PLT 345 05/19/2024      Chemistry      Component Value Date/Time   NA 140 04/21/2024 1313   K 3.2 (L) 04/21/2024 1313   CL 106 04/21/2024 1313   CO2 29 04/21/2024 1313   BUN 13 04/21/2024 1313   CREATININE 0.84 04/21/2024 1313      Component Value Date/Time   CALCIUM  9.0 04/21/2024 1313   ALKPHOS 77 04/21/2024 1313    AST 16 04/21/2024 1313   ALT 8 04/21/2024 1313   BILITOT 0.5 04/21/2024 1313       RADIOGRAPHIC STUDIES: No results found.     ASSESSMENT AND PLAN: This is a very pleasant 83 years old white female with extensive stage small cell lung cancer and she is currently undergoing treatment with carboplatin , etoposide  and Imfinzi  status post 5 cycles.  Starting from cycle #6 the patient is on maintenance treatment with single agent Imfinzi  every 4 weeks status post 67 cycles of total treatment. The patient has been tolerating this treatment fairly well. She had repeat CT angiogram of the chest during her recent hospitalization and that showed mild increase in the size of the subpleural mass along the anterior lateral left upper lobe, left infrahilar mass and mediastinal lymphadenopathy suspicious to be related to the pneumonia rather than rapidly progressive malignancy. Assessment and Plan Assessment & Plan Extensive stage small cell lung cancer on active systemic chemoimmunotherapy Extensive stage small cell lung cancer diagnosed in May 2020. Currently on cycle 68 of palliative systemic chemoimmunotherapy with carboplatin , etoposide , and Imfinzi . Treatment has been ongoing for over five years and is effective in managing the cancer. - Proceed with current chemoimmunotherapy regimen - Schedule next scan after the following treatment cycle - Follow up in four weeks for the next treatment cycle  Abdominal pain Abdominal pain localized to the gastric area, present for about a week. No associated symptoms of acid reflux, gas, constipation, diarrhea, nausea, or vomiting. - Advise dietary modifications to avoid spicy and gas-producing foods - Encourage intake of bland foods and fluids  Unintentional weight loss Reports unintentional weight loss, but exact amount not specified. Previously weighed 133 lbs. - Encourage increased intake of soft foods - Monitor weight She was advised to call  immediately if she has any other concerning symptoms in the interval.   The patient voices understanding of current disease status and treatment options and is in agreement with the current care plan.  All questions were answered. The patient knows to call the clinic with any problems, questions or concerns. We can certainly see the patient much sooner if necessary. The total time spent in the appointment was 20 minutes.  Disclaimer: This note was dictated with voice recognition software. Similar sounding words can inadvertently be transcribed and may not be corrected upon review.

## 2024-05-19 NOTE — Patient Instructions (Signed)
 [facility] - A DEPT OF [facility]  Discharge Instructions: Thank you for choosing [facility] to provide your oncology and hematology care.   If you have a lab appointment with the Cancer Center, please go directly to the Cancer Center and check in at the registration area.   Wear comfortable clothing and clothing appropriate for easy access to any Portacath or PICC line.   We strive to give you quality time with your provider. You may need to reschedule your appointment if you arrive late (15 or more minutes).  Arriving late affects you and other patients whose appointments are after yours.  Also, if you miss three or more appointments without notifying the office, you may be dismissed from the clinic at the provider's discretion.      For prescription refill requests, have your pharmacy contact our office and allow 72 hours for refills to be completed.    Today you received the following chemotherapy and/or immunotherapy agents: durvalumab       To help prevent nausea and vomiting after your treatment, we encourage you to take your nausea medication as directed.  BELOW ARE SYMPTOMS THAT SHOULD BE REPORTED IMMEDIATELY: *FEVER GREATER THAN 100.4 F (38 C) OR HIGHER *CHILLS OR SWEATING *NAUSEA AND VOMITING THAT IS NOT CONTROLLED WITH YOUR NAUSEA MEDICATION *UNUSUAL SHORTNESS OF BREATH *UNUSUAL BRUISING OR BLEEDING *URINARY PROBLEMS (pain or burning when urinating, or frequent urination) *BOWEL PROBLEMS (unusual diarrhea, constipation, pain near the anus) TENDERNESS IN MOUTH AND THROAT WITH OR WITHOUT PRESENCE OF ULCERS (sore throat, sores in mouth, or a toothache) UNUSUAL RASH, SWELLING OR PAIN  UNUSUAL VAGINAL DISCHARGE OR ITCHING   Items with * indicate a potential emergency and should be followed up as soon as possible or go to the Emergency Department if any problems should occur.  Please show the CHEMOTHERAPY ALERT CARD or IMMUNOTHERAPY  ALERT CARD at check-in to the Emergency Department and triage nurse.  Should you have questions after your visit or need to cancel or reschedule your appointment, please contact [facility] - A DEPT OF [facility]  Dept: [phone]  and follow the prompts.  Office hours are 8:00 a.m. to 4:30 p.m. Monday - Friday. Please note that voicemails left after 4:00 p.m. may not be returned until the following business day.  We are closed weekends and major holidays. You have access to a nurse at all times for urgent questions. Please call the main number to the clinic Dept: [phone] and follow the prompts.   For any non-urgent questions, you may also contact your provider using MyChart. We now offer e-Visits for anyone 26 and older to request care online for non-urgent symptoms. For details visit [email or web address].   Also download the MyChart app! Go to the app store, search MyChart, open the app, select [place], and log in with your MyChart username and password.

## 2024-05-20 ENCOUNTER — Telehealth: Payer: Self-pay

## 2024-05-20 NOTE — Telephone Encounter (Signed)
 Spoke with Rosaline from [facility] in regards to verbal orders for OT.  Informed her that I would relay the information to Dr. Sherrod for review and call her back with recommendations.

## 2024-05-21 ENCOUNTER — Other Ambulatory Visit: Payer: Self-pay

## 2024-05-21 ENCOUNTER — Telehealth: Payer: Self-pay | Admitting: Medical Oncology

## 2024-05-21 NOTE — Telephone Encounter (Signed)
 F/u on OT request from yesterday. Per Dr. Sherrod , I told Rosaline that if needed , it is ok for OT .

## 2024-05-24 ENCOUNTER — Other Ambulatory Visit: Payer: Self-pay

## 2024-05-27 ENCOUNTER — Emergency Department (HOSPITAL_BASED_OUTPATIENT_CLINIC_OR_DEPARTMENT_OTHER)

## 2024-05-27 ENCOUNTER — Inpatient Hospital Stay (HOSPITAL_COMMUNITY)
Admission: EM | Admit: 2024-05-27 | Discharge: 2024-05-30 | DRG: 603 | Disposition: A | Discharge status: Home Health | Source: Skilled Nursing Facility | Attending: Internal Medicine | Admitting: Internal Medicine

## 2024-05-27 ENCOUNTER — Other Ambulatory Visit: Payer: Self-pay

## 2024-05-27 ENCOUNTER — Emergency Department (HOSPITAL_COMMUNITY)

## 2024-05-27 ENCOUNTER — Encounter (HOSPITAL_COMMUNITY): Payer: Self-pay | Admitting: Emergency Medicine

## 2024-05-27 DIAGNOSIS — T451X5A Adverse effect of antineoplastic and immunosuppressive drugs, initial encounter: Secondary | ICD-10-CM | POA: Diagnosis present

## 2024-05-27 DIAGNOSIS — Z8673 Personal history of transient ischemic attack (TIA), and cerebral infarction without residual deficits: Secondary | ICD-10-CM

## 2024-05-27 DIAGNOSIS — L03114 Cellulitis of left upper limb: Principal | ICD-10-CM | POA: Diagnosis present

## 2024-05-27 DIAGNOSIS — Z885 Allergy status to narcotic agent status: Secondary | ICD-10-CM

## 2024-05-27 DIAGNOSIS — Z9842 Cataract extraction status, left eye: Secondary | ICD-10-CM

## 2024-05-27 DIAGNOSIS — J449 Chronic obstructive pulmonary disease, unspecified: Secondary | ICD-10-CM | POA: Diagnosis present

## 2024-05-27 DIAGNOSIS — Z90710 Acquired absence of both cervix and uterus: Secondary | ICD-10-CM

## 2024-05-27 DIAGNOSIS — Z7982 Long term (current) use of aspirin: Secondary | ICD-10-CM

## 2024-05-27 DIAGNOSIS — J9611 Chronic respiratory failure with hypoxia: Secondary | ICD-10-CM | POA: Diagnosis present

## 2024-05-27 DIAGNOSIS — E785 Hyperlipidemia, unspecified: Secondary | ICD-10-CM | POA: Diagnosis present

## 2024-05-27 DIAGNOSIS — Z96612 Presence of left artificial shoulder joint: Secondary | ICD-10-CM | POA: Diagnosis present

## 2024-05-27 DIAGNOSIS — L89312 Pressure ulcer of right buttock, stage 2: Secondary | ICD-10-CM | POA: Diagnosis present

## 2024-05-27 DIAGNOSIS — L0390 Cellulitis, unspecified: Secondary | ICD-10-CM | POA: Diagnosis not present

## 2024-05-27 DIAGNOSIS — Z79899 Other long term (current) drug therapy: Secondary | ICD-10-CM

## 2024-05-27 DIAGNOSIS — C7989 Secondary malignant neoplasm of other specified sites: Secondary | ICD-10-CM | POA: Diagnosis present

## 2024-05-27 DIAGNOSIS — Z8249 Family history of ischemic heart disease and other diseases of the circulatory system: Secondary | ICD-10-CM

## 2024-05-27 DIAGNOSIS — E876 Hypokalemia: Secondary | ICD-10-CM | POA: Diagnosis not present

## 2024-05-27 DIAGNOSIS — R609 Edema, unspecified: Secondary | ICD-10-CM | POA: Diagnosis not present

## 2024-05-27 DIAGNOSIS — Z961 Presence of intraocular lens: Secondary | ICD-10-CM | POA: Diagnosis present

## 2024-05-27 DIAGNOSIS — L8990 Pressure ulcer of unspecified site, unspecified stage: Secondary | ICD-10-CM | POA: Insufficient documentation

## 2024-05-27 DIAGNOSIS — C7951 Secondary malignant neoplasm of bone: Secondary | ICD-10-CM | POA: Diagnosis present

## 2024-05-27 DIAGNOSIS — B369 Superficial mycosis, unspecified: Secondary | ICD-10-CM

## 2024-05-27 DIAGNOSIS — Z85118 Personal history of other malignant neoplasm of bronchus and lung: Secondary | ICD-10-CM

## 2024-05-27 DIAGNOSIS — Z9981 Dependence on supplemental oxygen: Secondary | ICD-10-CM

## 2024-05-27 DIAGNOSIS — Z66 Do not resuscitate: Secondary | ICD-10-CM | POA: Diagnosis present

## 2024-05-27 DIAGNOSIS — Z9841 Cataract extraction status, right eye: Secondary | ICD-10-CM

## 2024-05-27 DIAGNOSIS — Z981 Arthrodesis status: Secondary | ICD-10-CM

## 2024-05-27 DIAGNOSIS — I5042 Chronic combined systolic (congestive) and diastolic (congestive) heart failure: Secondary | ICD-10-CM | POA: Diagnosis present

## 2024-05-27 DIAGNOSIS — G620 Drug-induced polyneuropathy: Secondary | ICD-10-CM | POA: Diagnosis present

## 2024-05-27 DIAGNOSIS — F17210 Nicotine dependence, cigarettes, uncomplicated: Secondary | ICD-10-CM | POA: Diagnosis present

## 2024-05-27 LAB — CBC WITH DIFFERENTIAL/PLATELET
Abs Immature Granulocytes: 0.07 K/uL (ref 0.00–0.07)
Basophils Absolute: 0.1 K/uL (ref 0.0–0.1)
Basophils Relative: 0 %
Eosinophils Absolute: 0.1 K/uL (ref 0.0–0.5)
Eosinophils Relative: 1 %
HCT: 32.3 % — ABNORMAL LOW (ref 36.0–46.0)
Hemoglobin: 10.4 g/dL — ABNORMAL LOW (ref 12.0–15.0)
Immature Granulocytes: 1 %
Lymphocytes Relative: 8 %
Lymphs Abs: 1.2 K/uL (ref 0.7–4.0)
MCH: 30.4 pg (ref 26.0–34.0)
MCHC: 32.2 g/dL (ref 30.0–36.0)
MCV: 94.4 fL (ref 80.0–100.0)
Monocytes Absolute: 0.7 K/uL (ref 0.1–1.0)
Monocytes Relative: 5 %
Neutro Abs: 13 K/uL — ABNORMAL HIGH (ref 1.7–7.7)
Neutrophils Relative %: 85 %
Platelets: 404 K/uL — ABNORMAL HIGH (ref 150–400)
RBC: 3.42 MIL/uL — ABNORMAL LOW (ref 3.87–5.11)
RDW: 13.4 % (ref 11.5–15.5)
WBC: 15.2 K/uL — ABNORMAL HIGH (ref 4.0–10.5)
nRBC: 0 % (ref 0.0–0.2)

## 2024-05-27 LAB — COMPREHENSIVE METABOLIC PANEL WITH GFR
ALT: 13 U/L (ref 0–44)
AST: 18 U/L (ref 15–41)
Albumin: 2.6 g/dL — ABNORMAL LOW (ref 3.5–5.0)
Alkaline Phosphatase: 74 U/L (ref 38–126)
Anion gap: 9 (ref 5–15)
BUN: 23 mg/dL (ref 8–23)
CO2: 21 mmol/L — ABNORMAL LOW (ref 22–32)
Calcium: 8.7 mg/dL — ABNORMAL LOW (ref 8.9–10.3)
Chloride: 106 mmol/L (ref 98–111)
Creatinine, Ser: 0.96 mg/dL (ref 0.44–1.00)
GFR, Estimated: 59 mL/min — ABNORMAL LOW (ref >60)
Glucose, Bld: 117 mg/dL — ABNORMAL HIGH (ref 70–99)
Potassium: 3.7 mmol/L (ref 3.5–5.1)
Sodium: 136 mmol/L (ref 135–145)
Total Bilirubin: 0.7 mg/dL (ref 0.0–1.2)
Total Protein: 6.8 g/dL (ref 6.5–8.1)

## 2024-05-27 LAB — I-STAT CG4 LACTIC ACID, ED: Lactic Acid, Venous: 0.4 mmol/L — ABNORMAL LOW (ref 0.5–1.9)

## 2024-05-27 LAB — PROTIME-INR
INR: 1.2 (ref 0.8–1.2)
Prothrombin Time: 15.5 s — ABNORMAL HIGH (ref 11.4–15.2)

## 2024-05-27 MED ORDER — CEFAZOLIN SODIUM-DEXTROSE 2-4 GM/100ML-% IV SOLN
2.0000 g | Freq: Three times a day (TID) | INTRAVENOUS | Status: DC
  Administered 2024-05-28 – 2024-05-30 (×8): 2 g via INTRAVENOUS
  Filled 2024-05-27 (×8): qty 100

## 2024-05-27 MED ORDER — IOHEXOL 300 MG/ML  SOLN
75.0000 mL | Freq: Once | INTRAMUSCULAR | Status: AC | PRN
  Administered 2024-05-27: 75 mL via INTRAVENOUS

## 2024-05-27 MED ORDER — CEPHALEXIN 500 MG PO CAPS: 500.0000 mg | ORAL_CAPSULE | Freq: Once | ORAL | Status: DC

## 2024-05-27 MED ORDER — NYSTATIN 100000 UNIT/GM EX POWD
Freq: Once | CUTANEOUS | Status: DC
  Filled 2024-05-27: qty 15

## 2024-05-27 MED ORDER — ZINC OXIDE 40 % EX OINT
TOPICAL_OINTMENT | Freq: Once | CUTANEOUS | Status: AC
  Filled 2024-05-27: qty 57

## 2024-05-27 MED ORDER — ENOXAPARIN SODIUM 40 MG/0.4ML IJ SOSY
40.0000 mg | PREFILLED_SYRINGE | INTRAMUSCULAR | Status: DC
  Administered 2024-05-28 – 2024-05-30 (×3): 40 mg via SUBCUTANEOUS
  Filled 2024-05-27 (×3): qty 0.4

## 2024-05-27 MED ORDER — ACETAMINOPHEN 650 MG RE SUPP
650.0000 mg | Freq: Four times a day (QID) | RECTAL | Status: DC | PRN
Start: 2024-05-27 — End: 2024-05-30

## 2024-05-27 MED ORDER — ATORVASTATIN CALCIUM 40 MG PO TABS
40.0000 mg | ORAL_TABLET | Freq: Every day | ORAL | Status: DC
  Administered 2024-05-28 – 2024-05-30 (×3): 40 mg via ORAL
  Filled 2024-05-27 (×3): qty 1

## 2024-05-27 MED ORDER — NYSTATIN 100000 UNIT/GM EX POWD
Freq: Three times a day (TID) | CUTANEOUS | Status: DC
  Administered 2024-05-28 – 2024-05-30 (×3): 1 via TOPICAL
  Filled 2024-05-27: qty 15

## 2024-05-27 MED ORDER — SODIUM CHLORIDE 0.9 % IV SOLN: Freq: Once | INTRAVENOUS | Status: AC

## 2024-05-27 MED ORDER — ASPIRIN 81 MG PO TBEC
81.0000 mg | DELAYED_RELEASE_TABLET | Freq: Every day | ORAL | Status: DC
  Administered 2024-05-28 – 2024-05-30 (×3): 81 mg via ORAL
  Filled 2024-05-27 (×3): qty 1

## 2024-05-27 MED ORDER — SODIUM CHLORIDE 0.9 % IV SOLN
2.0000 g | Freq: Once | INTRAVENOUS | Status: AC
  Administered 2024-05-27: 2 g via INTRAVENOUS
  Filled 2024-05-27: qty 20

## 2024-05-27 MED ORDER — GABAPENTIN 100 MG PO CAPS
100.0000 mg | ORAL_CAPSULE | Freq: Two times a day (BID) | ORAL | Status: DC
  Administered 2024-05-27 – 2024-05-30 (×6): 100 mg via ORAL
  Filled 2024-05-27 (×7): qty 1

## 2024-05-27 MED ORDER — SODIUM CHLORIDE 0.9 % IV BOLUS
500.0000 mL | Freq: Once | INTRAVENOUS | Status: AC
  Administered 2024-05-27: 500 mL via INTRAVENOUS

## 2024-05-27 MED ORDER — SODIUM CHLORIDE 0.9 % IV BOLUS: 1000.0000 mL | Freq: Once | INTRAVENOUS | Status: DC

## 2024-05-27 MED ORDER — ACETAMINOPHEN 325 MG PO TABS
650.0000 mg | ORAL_TABLET | Freq: Four times a day (QID) | ORAL | Status: DC | PRN
Start: 2024-05-27 — End: 2024-05-30

## 2024-05-27 MED ORDER — TRAZODONE HCL 50 MG PO TABS: 25.0000 mg | ORAL_TABLET | Freq: Every evening | ORAL | Status: DC | PRN

## 2024-05-27 MED ORDER — FLUCONAZOLE 150 MG PO TABS: 150.0000 mg | ORAL_TABLET | Freq: Once | ORAL | Status: DC

## 2024-05-27 NOTE — ED Notes (Signed)
 Pt to CT

## 2024-05-27 NOTE — ED Provider Notes (Signed)
 [place] EMERGENCY DEPARTMENT AT [facility] Provider Note   CSN: [id number] Arrival date & time: 05/27/24  1419     Patient presents with: No chief complaint on file.   Brittany Marks is a 83 y.o. female.  {Add pertinent medical, surgical, social history, OB history to HPI:32947} HPI     83 y.o. 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    Patient currently resides at [facility].  Patient sent to the ER because of left arm rash.  Patient states that she has history of shoulder surgeries on left side and rotator cuff injury.  She does not move her left upper extremity very much because of pain/discomfort.  She started developing rash on the left upper extremity yesterday.  She has mild pain in that area as well.  Patient denies any fevers, chills.  Prior to Admission medications   Medication Sig Start Date End Date Taking? Authorizing Provider  acetaminophen  (TYLENOL ) 325 MG tablet Take 2 tablets (650 mg total) by mouth every 4 (four) hours as needed for mild pain (pain score 1-3) (or temp > 37.5 C (99.5 F)). 12/11/23   [place], Camellia PARAS, DO  albuterol  (VENTOLIN  HFA) 108 (90 Base) MCG/ACT inhaler Inhale 2 puffs into the lungs every 6 (six) hours as needed for wheezing or shortness of breath. 08/07/22   Heilingoetter, Cassandra L, PA-C  Ascorbic Acid (VITAMIN C PO) Take 1 tablet by mouth daily.    [provider]  aspirin  EC 81 MG tablet Take 1 tablet (81 mg total) by mouth daily. Swallow whole. 12/12/23   [place], Eric J, DO  atorvastatin  (LIPITOR) 40 MG tablet Take 1 tablet (40 mg total) by mouth daily. 12/12/23   [place], Camellia PARAS, DO  b complex  vitamins tablet Take 1 tablet by mouth daily.    [provider]  durvalumab  1,500 mg in sodium chloride  0.9 % 100 mL Inject 1,500 mg into the vein every 28 (twenty-eight) days.    [provider]  gabapentin  (NEURONTIN ) 100 MG capsule Take 1 capsule (100 mg total) by mouth 2 (two) times daily. Patient taking differently: Take 100 mg by mouth See admin instructions. Take 100 mg by mouth in the morning and evening 12/03/23   Sherrod Sherrod, MD  ibuprofen (ADVIL) 600 MG tablet Take 600 mg by mouth 2 (two) times daily as needed. 03/17/24   [provider]  Multiple Vitamin (MULTIVITAMIN) tablet Take 1 tablet by mouth daily with breakfast.    [provider]  nicotine  (NICODERM CQ  - DOSED IN MG/24 HOURS) 14 mg/24hr patch Place 14 mg onto the skin daily as needed (for the urge to smoke while hospitalized).    [provider]  ondansetron  (ZOFRAN ) 4 MG tablet Take 1 tablet (4 mg total) by mouth every 8 (eight) hours as needed for nausea or vomiting. 03/11/20   Shuford, Randine, PA-C  oxymetazoline (AFRIN) 0.05 % nasal spray Place 2 sprays into both nostrils 2 (two) times daily as needed (for a bloody nose).    [provider]  senna-docusate (SENOKOT-S) 8.6-50 MG tablet Take 1 tablet by mouth at bedtime as needed for mild constipation. 12/11/23   [place], Eric J, DO    Allergies:  Oxycodone     Review of Systems  All other systems reviewed and are negative.   Updated Vital Signs BP (!) 125/53 (BP Location: Right Arm)   Pulse 78   Temp 98.2 F (36.8 C) (Oral)   Resp 16   SpO2 99%   Physical Exam Vitals and nursing note reviewed.  Constitutional:      Appearance: She is well-developed.  HENT:     Head: Atraumatic.  Cardiovascular:     Rate and Rhythm: Normal rate.  Pulmonary:     Effort: Pulmonary effort is normal.  Musculoskeletal:     Cervical back: Normal range of motion and neck supple.  Skin:    General: Skin is warm and dry.      Findings: Erythema and rash present.     Comments: Left axilla has profound erythema, mucus-like drainage  Neurological:     Mental Status: She is alert and oriented to person, place, and time.        (all labs ordered are listed, but only abnormal results are displayed) Labs Reviewed - No data to display  EKG: None  Radiology: No results found.  {Document cardiac monitor, telemetry assessment procedure when appropriate:32947} Procedures   Medications Ordered in the ED  liver oil-zinc  oxide (DESITIN) 40 % ointment (has no administration in time range)  fluconazole  (DIFLUCAN ) tablet 150 mg (has no administration in time range)  cephALEXin  (KEFLEX ) capsule 500 mg (has no administration in time range)      {Click here for ABCD2, HEART and other calculators REFRESH Note before signing:1}                              Medical Decision Making Amount and/or Complexity of Data Reviewed Labs: ordered. Radiology: ordered.  Risk OTC drugs. Prescription drug management.   83 y.o. 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  On exam, concerns for infection in the axillary region. Clinically, it appears that patient just has increased moisture in that area leading to dermatitis and possibly superimposed cellulitis.  However, upon review of patient's chart, it appears that she also has left upper lung lobe pathology.  Given this, there could be malignant extension leading to the exam finding that we see.  I reviewed patient's records including notes from oncology. I have also reviewed patient's CT chest from 03-2024.  That CT PE was  reassuring from PE perspective.  There was also no evidence of SVC syndrome.  Plan is to get basic labs.  If the screening labs are reassuring, then plan will be to treat patient with barrier cream and Keflex .  If the screening labs are concerning, then we should consider CT imaging.  Final diagnoses:  None    ED Discharge Orders     None

## 2024-05-27 NOTE — ED Provider Notes (Signed)
 3:52 PM Patient signed out to me by previous ED physician. Pt is a 83 yo female presenting for rash on left upper extremity. Skin exam concerning for cutaneous yeast infection in the axillary region with erythema, warmth, and swelling proceeding down the left upper extremity concerning for cellulitis.  Due to patient's history of malignancy will order CT to rule out any erythema and swelling from masses or occlusive process.  Will order ultrasound to rule out thrombophlebitis.,  Strong suspicion for cellulitis from untreated yeast infection.   Physical Exam  BP (!) 125/53 (BP Location: Right Arm)   Pulse 78   Temp 98.2 F (36.8 C) (Oral)   Resp 16   SpO2 99%   Physical Exam  Procedures  Procedures  ED Course / MDM    Medical Decision Making Amount and/or Complexity of Data Reviewed Labs: ordered. Radiology: ordered.  Risk OTC drugs. Prescription drug management. Decision regarding hospitalization.   Ultrasound negative for DVT/SVT.  CT chest with contrast demonstrates left upper lobe cavitary lesion that has decreased in size.  Left hilar lymphadenopathy has decreased in size.  Bilateral lower lobe airspace disease has significantly decreased and resolved from previous CT images.  No new lung infiltrates.  Patient's blood pressure is slowly been downtrending in the emergency department today.  Originally read with a blood pressure 125/53.  Currently 101/52.  Patient does have a history of congestive heart failure with an EF of 40 to 45%.  Will give 500 cc fluid bolus and reevaluate.  She is afebrile.  No tachycardia.  Leukocytosis is present at 15.2.  Rocephin  given.  Nystatin  powder placed in axillary region.  Her lactic acid was normal.  Blood cultures were drawn for possible sepsis.  Otherwise stable.  Although patient does not technically qualify for sepsis at this time due to lack of SIRS criteria she does have an elevated white blood cell count at 15.2 and a downtrending blood  pressure.  She is pleasantly demented, poor historian, and concerns for lost to follow-up.  I do recommend she is admitted for IV antibiotics and further monitoring.  Patient accepted for by Dr. Cole.    Elnor Bernarda SQUIBB, DO 05/27/24 2111

## 2024-05-27 NOTE — ED Provider Triage Note (Signed)
 Emergency Medicine Provider Triage Evaluation Note  Brittany Marks , a 83 y.o. female  was evaluated in triage.  Pt complains of rash to left axillary region, present for last 2 days, mildly pruritic and has had a foul, yeasty smell.  Sent over by [facility].  Denies fever, headache, vision changes, chest pain, shortness of breath, abdominal pain, nausea, vomiting, diarrhea, dysuria, lower leg swelling.  Numbness, weakness, tingling, body aches, chills.  Review of Systems  Positive: N/a Negative: N/a  Physical Exam  BP (!) 125/53 (BP Location: Right Arm)   Pulse 78   Temp 98.2 F (36.8 C) (Oral)   Resp 16   SpO2 99%  Gen:   Awake, no distress   Resp:  Normal effort  MSK:   Moves extremities without difficulty  Other:    Medical Decision Making  Medically screening exam initiated at 2:40 PM.  Appropriate orders placed.  CASARA PERRIER was informed that the remainder of the evaluation will be completed by another provider, this initial triage assessment does not replace that evaluation, and the importance of remaining in the ED until their evaluation is complete.     Beola Terrall RAMAN, [place] 05/27/24 [phone]

## 2024-05-27 NOTE — ED Triage Notes (Signed)
 Pt arrived via EMS, c/o rash on left arm. Staff states that it had an odor and extended area.

## 2024-05-27 NOTE — H&P (Signed)
 History and Physical  BEV DRENNEN FMW:[id number] DOB: 1941/01/11 DOA: 05/27/2024  PCP: Freddrick, No   Chief Complaint: left arm infection   HPI: Brittany Marks is a 83 y.o. 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  Patient apparently lived on her own until relatively recently, currently lives at a facility and staff noted that she had an odor and area of erythema in the left armpit going down towards her elbow.  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  Later, when I tell her that she has an infection on her arm and needs to be admitted to the hospital, she tells me she did not know anything about that and does not know why she is here in the hospital.  In any case, she denies any chest pain, shortness of breath, nausea, vomiting or any other concerns.  Workup in the emergency department including CT scan and left upper extremity Doppler study has no acute findings, but on examination there is certainly concern for an uncomplicated cellulitis.  She was given a dose of IV Rocephin  and hospitalist admission was requested.  Review of Systems: Please see HPI for pertinent positives and negatives. A complete 10 system review of systems are otherwise negative.  Past Medical History:  Diagnosis Date   Acute CVA (cerebrovascular accident) (HCC) 12/07/2023   Cataract    Bilateral   Constipation    pain medication   Left rotator cuff tear    Right rotator cuff tear    SCL CA dx'd 11/2018   Past Surgical History:  Procedure Laterality Date   CATARACT  EXTRACTION W/ INTRAOCULAR LENS  IMPLANT, BILATERAL  2012   DECOMPRESSIVE LUMBAR LAMINECTOMY LEVEL 2 N/A 07/29/2013   Procedure: LUMBAR LAMINECTOMY, DECOMPRESSION LUMBAR THREE TO FOUR, FOUR TO FIVE microdiscectomy l3,4 right;  Surgeon: Tanda DELENA Heading, MD;  Location: [facility];  Service: Orthopedics;  Laterality: N/A;   I & D SUPERIOR RIGHT SHOULDER AND CLOSURE WOUND  01-10-2011   S/P ROTATOR CUFF REPAIR   IR IMAGING GUIDED PORT INSERTION  04/09/2019   LUMBAR LAMINECTOMY  1970'S   LUMBAR LAMINECTOMY/DECOMPRESSION MICRODISCECTOMY N/A 06/27/2016   Procedure: L1 - L2 DISCECTOMY;  Surgeon: Donaciano Sprang, MD;  Location: [facility];  Service: Orthopedics;  Laterality: N/A;   LUMBAR SPINE SURGERY  1983   REVERSE SHOULDER ARTHROPLASTY Left 03/10/2020   Procedure: REVERSE SHOULDER ARTHROPLASTY;  Surgeon: Melita Drivers, MD;  Location: [facility];  Service: Orthopedics;  Laterality: Left;    RIGHT SHOULDER ARTHROSCOPY/ OPEN DISTAL CLAVICLE RESECTION/ SAD/ OPEN ROTATOR CUFF REPAIR  11-28-2010   SHOULDER FUSION SURGERY  03/11/2020   Shoulder Surgery , hardware placed   SHOULDER OPEN ROTATOR CUFF REPAIR  12/19/2011   Procedure: ROTATOR CUFF REPAIR SHOULDER OPEN;  Surgeon: Lynwood SHAUNNA Bern, MD;  Location: [facility];  Service: Orthopedics;  Laterality: Right;  RIGHT RECURRENT OPEN REPAIR OF THE ROTATOR CUFF WITH TISSUE MEND GRAFTANTERIOR CHROMIOECTOMY   VAGINAL HYSTERECTOMY  1979   Social History:  reports that she has been smoking cigarettes. She has a  25 pack-year smoking history. She has never used smokeless tobacco. She reports current alcohol  use. She reports that she does not use drugs.  Allergies  Allergen Reactions   Oxycodone  Other (See Comments)    Unsteady gait, fall, altered mental status    Family History  Problem Relation Age of Onset   Congestive Heart Failure Mother    Congestive Heart Failure Father      Prior to Admission medications   Medication Sig Start Date End Date  Taking? Authorizing Provider  acetaminophen  (TYLENOL ) 325 MG tablet Take 2 tablets (650 mg total) by mouth every 4 (four) hours as needed for mild pain (pain score 1-3) (or temp > 37.5 C (99.5 F)). 12/11/23   [place], Camellia PARAS, DO  albuterol  (VENTOLIN  HFA) 108 (90 Base) MCG/ACT inhaler Inhale 2 puffs into the lungs every 6 (six) hours as needed for wheezing or shortness of breath. 08/07/22   Heilingoetter, Cassandra L, PA-C  Ascorbic Acid (VITAMIN C PO) Take 1 tablet by mouth daily.    [provider]  aspirin  EC 81 MG tablet Take 1 tablet (81 mg total) by mouth daily. Swallow whole. 12/12/23   [place], Camellia PARAS, DO  atorvastatin  (LIPITOR) 40 MG tablet Take 1 tablet (40 mg total) by mouth daily. 12/12/23   [place], Camellia PARAS, DO  b complex vitamins tablet Take 1 tablet by mouth daily.    [provider]  durvalumab  1,500 mg in sodium chloride  0.9 % 100 mL Inject 1,500 mg into the vein every 28 (twenty-eight) days.    [provider]  gabapentin  (NEURONTIN ) 100 MG capsule Take 1 capsule (100 mg total) by mouth 2 (two) times daily. Patient taking differently: Take 100 mg by mouth See admin instructions. Take 100 mg by mouth in the morning and evening 12/03/23   Sherrod Sherrod, MD  ibuprofen (ADVIL) 600 MG tablet Take 600 mg by mouth 2 (two) times daily as needed. 03/17/24   [provider]  Multiple Vitamin (MULTIVITAMIN) tablet Take 1 tablet by mouth daily with breakfast.    [provider]  nicotine  (NICODERM CQ  - DOSED IN MG/24 HOURS) 14 mg/24hr patch Place 14 mg onto the skin daily as needed (for the urge to smoke while hospitalized).    [provider]  ondansetron  (ZOFRAN ) 4 MG tablet Take 1 tablet (4 mg total) by mouth every 8 (eight) hours as needed for nausea or vomiting. 03/11/20   Shuford, Randine, PA-C  oxymetazoline (AFRIN) 0.05 % nasal spray Place 2 sprays into both nostrils 2 (two) times daily as needed (for a bloody nose).    [provider]   senna-docusate (SENOKOT-S) 8.6-50 MG tablet Take 1 tablet by mouth at bedtime as needed for mild constipation. 12/11/23   [place], Eric J, DO    Physical Exam: BP (!) 101/52   Pulse 74   Temp 98.1 F (36.7 C) (Oral)   Resp 15   SpO2 99%  General:  Alert, oriented to self only, calm, in no acute distress, resting comfortably on 3 L nasal cannula oxygen .  Patient appears her stated age, is thin and frail. Cardiovascular: RRR, no murmurs or rubs, no peripheral edema  Respiratory: clear to auscultation bilaterally, no wheezes, no crackles  Abdomen: soft, nontender, nondistended, normal bowel tones heard  Skin: She has a confluent area of warmth, erythema without tenderness or fluctuance in the left armpit, radiating down towards the left elbow.  I see no significant extremity swelling in this area. Musculoskeletal: no joint  effusions, normal range of motion  Psychiatric: appropriate affect, normal speech  Neurologic: extraocular muscles intact, clear speech, moving all extremities with intact sensorium         Labs on Admission:  Basic Metabolic Panel: Recent Labs  Lab 05/27/24 1548  NA 136  K 3.7  CL 106  CO2 21*  GLUCOSE 117*  BUN 23  CREATININE 0.96  CALCIUM  8.7*   Liver Function Tests: Recent Labs  Lab 05/27/24 1548  AST 18  ALT 13  ALKPHOS 74  BILITOT 0.7  PROT 6.8  ALBUMIN 2.6*   No results for input(s): LIPASE, AMYLASE in the last 168 hours. No results for input(s): AMMONIA in the last 168 hours. CBC: Recent Labs  Lab 05/27/24 1548  WBC 15.2*  NEUTROABS 13.0*  HGB 10.4*  HCT 32.3*  MCV 94.4  PLT 404*   Cardiac Enzymes: No results for input(s): CKTOTAL, CKMB, CKMBINDEX, TROPONINI in the last 168 hours. BNP (last 3 results) No results for input(s): BNP in the last 8760 hours.  ProBNP (last 3 results) No results for input(s): PROBNP in the last 8760 hours.  CBG: No results for input(s): GLUCAP in the last 168  hours.  Radiological Exams on Admission: CT Chest W Contrast Result Date: 05/27/2024 CLINICAL DATA:  Left axilla swelling and pain. History of lung cancer. EXAM: CT CHEST WITH CONTRAST TECHNIQUE: Multidetector CT imaging of the chest was performed during intravenous contrast administration. RADIATION DOSE REDUCTION: This exam was performed according to the departmental dose-optimization program which includes automated exposure control, adjustment of the mA and/or kV according to patient size and/or use of iterative reconstruction technique. CONTRAST:  75mL OMNIPAQUE  IOHEXOL  300 MG/ML  SOLN COMPARISON:  CT chest 03/18/2024 FINDINGS: Cardiovascular: No significant vascular findings. Normal heart size. No pericardial effusion. Right chest port catheter tip ends in the right atrium. Mediastinum/Nodes: There are enlarged left hilar lymph nodes measuring up to 14 mm. Left hilar lymphadenopathy has slightly decreased in the interval. No new enlarged lymph nodes are identified. Visualized esophagus and thyroid  gland are within normal limits.There is no axillary lymphadenopathy Lungs/Pleura: Peripheral interstitial opacities are again seen scattered throughout both lungs. There are fibrotic changes in both lung bases. Bilateral lower lobe airspace disease has significantly decreased/resolved. Focal cavitary airspace opacity in the left upper lobe has decreased in size, but cavitation has increased. This now measures 2.7 By 4.6 Cm (previously 3.7 by 3.0 Cm). No new focal lung infiltrate identified. No pleural effusion or pneumothorax. Trachea and central airways are patent. Upper Abdomen: Prominent right extrarenal pelvis is partially imaged and grossly unchanged. Musculoskeletal: Degenerative changes affect the spine. Left shoulder arthroplasty is present. No acute fractures are seen. IMPRESSION: 1. Left upper lobe cavitary lesion has decreased in size, but cavitation has increased. 2. Left hilar lymphadenopathy has  slightly decreased in the interval. 3. Bilateral lower lobe airspace disease has significantly decreased/resolved. 4. 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 5. No new focal lung infiltrate. 6. No acute fractures are seen. Electronically Signed   By: Greig Pique M.D.   On: 05/27/2024 18:37   UE VENOUS DUPLEX (7am - 7pm) Result Date: 05/27/2024 UPPER VENOUS STUDY  Patient Name:  Brittany Marks  Date of Exam:   05/27/2024 Medical Rec #: [id number]          Accession #:    [id number] Date of Birth: 22-Jul-1941          Patient Gender: F Patient Age:  83 years Exam Location:  [facility] Procedure:      VAS US  UPPER EXTREMITY VENOUS DUPLEX Referring Phys: ANKIT NANAVATI --------------------------------------------------------------------------------  Indications: Edema, and redness, cancer hx Limitations: Painful axilla area with purulent drainage. Performing Technologist: Elmarie Lindau, RVT  Examination Guidelines: A complete evaluation includes B-mode imaging, spectral Doppler, color Doppler, and power Doppler as needed of all accessible portions of each vessel. Bilateral testing is considered an integral part of a complete examination. Limited examinations for reoccurring indications may be performed as noted.  Left Findings: +----------+------------+---------+-----------+----------+--------------+ LEFT      CompressiblePhasicitySpontaneousProperties   Summary     +----------+------------+---------+-----------+----------+--------------+ IJV           Full                                                 +----------+------------+---------+-----------+----------+--------------+ Subclavian               Yes                                       +----------+------------+---------+-----------+----------+--------------+ Axillary                                            Not visualized  +----------+------------+---------+-----------+----------+--------------+ Brachial      Full                                                 +----------+------------+---------+-----------+----------+--------------+ Radial        Full                                                 +----------+------------+---------+-----------+----------+--------------+ Ulnar         Full                                                 +----------+------------+---------+-----------+----------+--------------+ Cephalic      Full                                                 +----------+------------+---------+-----------+----------+--------------+ Basilic       Full                                                 +----------+------------+---------+-----------+----------+--------------+  Summary:  Left: Limited exam, however there is no evidence of left upper extremity DVT/SVT in areas visualized.  *See table(s) above for measurements and observations.    Preliminary    DG Chest Port 1 View Result Date:  05/27/2024 CLINICAL DATA:  Questionable sepsis EXAM: PORTABLE CHEST 1 VIEW COMPARISON:  Chest x-ray 03/25/2024.  CT of the chest 03/18/2024. FINDINGS: Right chest port catheter tip ends in the distal SVC. The heart is enlarged. There are atherosclerotic calcifications of the aorta. Prominence of the upper right paratracheal region corresponds to ectatic vasculature. There are new patchy multifocal airspace opacities throughout both lungs, left greater than right. There is no pleural effusion or pneumothorax. The osseous structures are stable no acute fractures are seen. IMPRESSION: 1. New patchy multifocal airspace opacities throughout both lungs, left greater than right, concerning for multifocal pneumonia. 2. Cardiomegaly. Electronically Signed   By: Greig Pique M.D.   On: 05/27/2024 15:36   Assessment/Plan SHAILYNN FONG is a 83 y.o. female with medical history significant for prior CVA,  metastatic small cell lung cancer on palliative chemotherapy, hyperlipidemia, COPD, heart failure with reduced EF, chemotherapy-induced neuropathy and chronic respiratory failure on 3 to 4 L nasal cannula oxygen  at baseline being admitted to the hospital with concern for left arm cellulitis.  Cellulitis-without evidence of sepsis, or other complication such as abscess -Observation admission -IV Ancef  -Nystatin  powder 3 times daily  COPD-no evidence of exacerbation, continue supplemental oxygen   Heart failure with reduced EF-patient appears euvolemic or perhaps a little dry, no evidence of acute heart failure exacerbation  Chemotherapy-induced neuropathy-gabapentin   Metastatic small cell lung cancer-on palliative chemo, Dr. Sherrod added to inpatient treatment team, no oncologic need for inpatient consult  Hyperlipidemia-Lipitor  DVT prophylaxis: Lovenox      Code Status: Limited: Do not attempt resuscitation (DNR) -DNR-LIMITED -Do Not Intubate/DNI   Consults called: None  Admission status: Observation  Time spent: 49 minutes  Alcie Runions CHRISTELLA Gail MD [facility] Pager [phone]  If 7PM-7AM, please contact night-coverage [email or web address] Password [email or web address]  05/27/2024, 9:25 PM

## 2024-05-27 NOTE — ED Notes (Signed)
 Pt has been changed into dry brief and repositioned in the bed

## 2024-05-28 ENCOUNTER — Encounter: Payer: Self-pay | Admitting: Internal Medicine

## 2024-05-28 ENCOUNTER — Encounter: Payer: Self-pay | Admitting: Oncology

## 2024-05-28 ENCOUNTER — Other Ambulatory Visit: Payer: Self-pay

## 2024-05-28 DIAGNOSIS — J9611 Chronic respiratory failure with hypoxia: Secondary | ICD-10-CM | POA: Diagnosis present

## 2024-05-28 DIAGNOSIS — G620 Drug-induced polyneuropathy: Secondary | ICD-10-CM | POA: Diagnosis present

## 2024-05-28 DIAGNOSIS — Z66 Do not resuscitate: Secondary | ICD-10-CM | POA: Diagnosis present

## 2024-05-28 DIAGNOSIS — T451X5A Adverse effect of antineoplastic and immunosuppressive drugs, initial encounter: Secondary | ICD-10-CM | POA: Diagnosis present

## 2024-05-28 DIAGNOSIS — I5042 Chronic combined systolic (congestive) and diastolic (congestive) heart failure: Secondary | ICD-10-CM | POA: Diagnosis present

## 2024-05-28 DIAGNOSIS — J449 Chronic obstructive pulmonary disease, unspecified: Secondary | ICD-10-CM | POA: Diagnosis present

## 2024-05-28 DIAGNOSIS — Z9981 Dependence on supplemental oxygen: Secondary | ICD-10-CM | POA: Diagnosis not present

## 2024-05-28 DIAGNOSIS — Z7982 Long term (current) use of aspirin: Secondary | ICD-10-CM | POA: Diagnosis not present

## 2024-05-28 DIAGNOSIS — Z96612 Presence of left artificial shoulder joint: Secondary | ICD-10-CM | POA: Diagnosis present

## 2024-05-28 DIAGNOSIS — C7951 Secondary malignant neoplasm of bone: Secondary | ICD-10-CM | POA: Diagnosis present

## 2024-05-28 DIAGNOSIS — F17210 Nicotine dependence, cigarettes, uncomplicated: Secondary | ICD-10-CM | POA: Diagnosis present

## 2024-05-28 DIAGNOSIS — L8990 Pressure ulcer of unspecified site, unspecified stage: Secondary | ICD-10-CM | POA: Insufficient documentation

## 2024-05-28 DIAGNOSIS — L03114 Cellulitis of left upper limb: Principal | ICD-10-CM | POA: Diagnosis present

## 2024-05-28 DIAGNOSIS — C7989 Secondary malignant neoplasm of other specified sites: Secondary | ICD-10-CM | POA: Diagnosis present

## 2024-05-28 DIAGNOSIS — E785 Hyperlipidemia, unspecified: Secondary | ICD-10-CM | POA: Diagnosis present

## 2024-05-28 DIAGNOSIS — Z90710 Acquired absence of both cervix and uterus: Secondary | ICD-10-CM | POA: Diagnosis not present

## 2024-05-28 DIAGNOSIS — Z79899 Other long term (current) drug therapy: Secondary | ICD-10-CM | POA: Diagnosis not present

## 2024-05-28 DIAGNOSIS — L89312 Pressure ulcer of right buttock, stage 2: Secondary | ICD-10-CM | POA: Diagnosis present

## 2024-05-28 DIAGNOSIS — Z85118 Personal history of other malignant neoplasm of bronchus and lung: Secondary | ICD-10-CM | POA: Diagnosis not present

## 2024-05-28 DIAGNOSIS — Z9842 Cataract extraction status, left eye: Secondary | ICD-10-CM | POA: Diagnosis not present

## 2024-05-28 DIAGNOSIS — E876 Hypokalemia: Secondary | ICD-10-CM | POA: Diagnosis not present

## 2024-05-28 DIAGNOSIS — Z961 Presence of intraocular lens: Secondary | ICD-10-CM | POA: Diagnosis present

## 2024-05-28 DIAGNOSIS — L0390 Cellulitis, unspecified: Secondary | ICD-10-CM | POA: Diagnosis present

## 2024-05-28 DIAGNOSIS — B369 Superficial mycosis, unspecified: Secondary | ICD-10-CM | POA: Diagnosis not present

## 2024-05-28 DIAGNOSIS — Z9841 Cataract extraction status, right eye: Secondary | ICD-10-CM | POA: Diagnosis not present

## 2024-05-28 DIAGNOSIS — Z8249 Family history of ischemic heart disease and other diseases of the circulatory system: Secondary | ICD-10-CM | POA: Diagnosis not present

## 2024-05-28 DIAGNOSIS — Z8673 Personal history of transient ischemic attack (TIA), and cerebral infarction without residual deficits: Secondary | ICD-10-CM | POA: Diagnosis not present

## 2024-05-28 LAB — BLOOD CULTURE ID PANEL (REFLEXED) - BCID2

## 2024-05-28 LAB — CBC
HCT: 29.9 % — ABNORMAL LOW (ref 36.0–46.0)
Hemoglobin: 9.6 g/dL — ABNORMAL LOW (ref 12.0–15.0)
MCH: 30.6 pg (ref 26.0–34.0)
MCHC: 32.1 g/dL (ref 30.0–36.0)
MCV: 95.2 fL (ref 80.0–100.0)
Platelets: 381 K/uL (ref 150–400)
RBC: 3.14 MIL/uL — ABNORMAL LOW (ref 3.87–5.11)
RDW: 13.4 % (ref 11.5–15.5)
WBC: 11.5 K/uL — ABNORMAL HIGH (ref 4.0–10.5)
nRBC: 0 % (ref 0.0–0.2)

## 2024-05-28 LAB — BASIC METABOLIC PANEL WITH GFR
Anion gap: 9 (ref 5–15)
BUN: 19 mg/dL (ref 8–23)
CO2: 22 mmol/L (ref 22–32)
Calcium: 8.4 mg/dL — ABNORMAL LOW (ref 8.9–10.3)
Chloride: 107 mmol/L (ref 98–111)
Creatinine, Ser: 0.87 mg/dL (ref 0.44–1.00)
GFR, Estimated: >60 mL/min (ref >60)
Glucose, Bld: 84 mg/dL (ref 70–99)
Potassium: 3.3 mmol/L — ABNORMAL LOW (ref 3.5–5.1)
Sodium: 138 mmol/L (ref 135–145)

## 2024-05-28 MED ORDER — CHLORHEXIDINE GLUCONATE CLOTH 2 % EX PADS
6.0000 | MEDICATED_PAD | Freq: Every day | CUTANEOUS | Status: DC
  Administered 2024-05-28 – 2024-05-30 (×3): 6 via TOPICAL

## 2024-05-28 MED ORDER — ENSURE PLUS HIGH PROTEIN PO LIQD
237.0000 mL | Freq: Two times a day (BID) | ORAL | Status: DC
  Administered 2024-05-28 – 2024-05-30 (×3): 237 mL via ORAL

## 2024-05-28 MED ORDER — POTASSIUM CHLORIDE CRYS ER 20 MEQ PO TBCR
40.0000 meq | EXTENDED_RELEASE_TABLET | ORAL | Status: AC
  Administered 2024-05-28 (×2): 40 meq via ORAL
  Filled 2024-05-28 (×2): qty 2

## 2024-05-28 MED ORDER — ALBUTEROL SULFATE (2.5 MG/3ML) 0.083% IN NEBU: 3.0000 mL | INHALATION_SOLUTION | Freq: Four times a day (QID) | RESPIRATORY_TRACT | Status: DC | PRN

## 2024-05-28 MED ORDER — SODIUM CHLORIDE 0.9% FLUSH
10.0000 mL | INTRAVENOUS | Status: DC | PRN
  Administered 2024-05-28: 10 mL

## 2024-05-28 MED ORDER — SODIUM CHLORIDE 0.9% FLUSH
10.0000 mL | Freq: Two times a day (BID) | INTRAVENOUS | Status: DC
  Administered 2024-05-28 – 2024-05-30 (×5): 10 mL

## 2024-05-28 MED ORDER — SENNOSIDES-DOCUSATE SODIUM 8.6-50 MG PO TABS: 1.0000 | ORAL_TABLET | Freq: Every evening | ORAL | Status: DC | PRN

## 2024-05-28 NOTE — Progress Notes (Signed)
 PHARMACY - PHYSICIAN COMMUNICATION CRITICAL VALUE ALERT - BLOOD CULTURE IDENTIFICATION (BCID)  Brittany Marks is an 83 y.o. female who presented to [facility] on 05/27/2024 with a chief complaint of LUE cellulitis  Assessment:  1/3 BCx bottles growing Staph spp. (Possibly contaminant, although staph not inconsistent with SSTI source)  Name of physician (or Provider) Contacted: Pahwani  Current antibiotics: Ancef   Changes to prescribed antibiotics recommended: Recommend continuing Ancef  alone as long as clinically improving Recommendations accepted by provider  Results for orders placed or performed during the hospital encounter of 05/27/24  Blood Culture ID Panel (Reflexed) (Collected: 05/27/2024  4:19 PM)  Result Value Ref Range   Enterococcus faecalis NOT DETECTED NOT DETECTED   Enterococcus Faecium NOT DETECTED NOT DETECTED   Listeria monocytogenes NOT DETECTED NOT DETECTED   Staphylococcus species DETECTED (A) NOT DETECTED   Staphylococcus aureus (BCID) NOT DETECTED NOT DETECTED   Staphylococcus epidermidis NOT DETECTED NOT DETECTED   Staphylococcus lugdunensis NOT DETECTED NOT DETECTED   Streptococcus species NOT DETECTED NOT DETECTED   Streptococcus agalactiae NOT DETECTED NOT DETECTED   Streptococcus pneumoniae NOT DETECTED NOT DETECTED   Streptococcus pyogenes NOT DETECTED NOT DETECTED   A.calcoaceticus-baumannii NOT DETECTED NOT DETECTED   Bacteroides fragilis NOT DETECTED NOT DETECTED   Enterobacterales NOT DETECTED NOT DETECTED   Enterobacter cloacae complex NOT DETECTED NOT DETECTED   Escherichia coli NOT DETECTED NOT DETECTED   Klebsiella aerogenes NOT DETECTED NOT DETECTED   Klebsiella oxytoca NOT DETECTED NOT DETECTED   Klebsiella pneumoniae NOT DETECTED NOT DETECTED   Proteus species NOT DETECTED NOT DETECTED   Salmonella species NOT DETECTED NOT DETECTED   Serratia marcescens NOT DETECTED NOT DETECTED   Haemophilus influenzae NOT DETECTED NOT DETECTED    Neisseria meningitidis NOT DETECTED NOT DETECTED   Pseudomonas aeruginosa NOT DETECTED NOT DETECTED   Stenotrophomonas maltophilia NOT DETECTED NOT DETECTED   Candida albicans NOT DETECTED NOT DETECTED   Candida auris NOT DETECTED NOT DETECTED   Candida glabrata NOT DETECTED NOT DETECTED   Candida krusei NOT DETECTED NOT DETECTED   Candida parapsilosis NOT DETECTED NOT DETECTED   Candida tropicalis NOT DETECTED NOT DETECTED   Cryptococcus neoformans/gattii NOT DETECTED NOT DETECTED    Brittany Marks A 05/28/2024  5:48 PM

## 2024-05-28 NOTE — Plan of Care (Signed)
  Problem: Clinical Measurements: Goal: Respiratory complications will improve Outcome: Progressing   Problem: Pain Managment: Goal: General experience of comfort will improve and/or be controlled Outcome: Progressing   Problem: Clinical Measurements: Goal: Ability to avoid or minimize complications of infection will improve Outcome: Progressing   Problem: Skin Integrity: Goal: Skin integrity will improve Outcome: Progressing

## 2024-05-28 NOTE — Evaluation (Signed)
 Physical Therapy Evaluation Patient Details Name: Brittany Marks MRN: [id number] DOB: [birth year] Today's Date: 05/28/2024  History of Present Illness  Patient is an 83 y/o female admitted 05/27/24 with concern for L arm cellulitis with rash and odor from L armpit to elbow.  PMH positive for CVA, chronic respiratory failure on 3-4 L O2, NSCLC with mets to bone and abdomen on palliative chemo, hyperlipidemia, neuropathy, tobacco use disorder.  Clinical Impression  Patient presents with mobility limited due to pain, decreased ROM and strength bilat UE's and decreased activity tolerance, decreased balance.  Previously was walking with rollator about 3 months ago.  Reports doing transfers to wheelchair at [facility].  Per daughter pt was getting some therapy while there though limited.  Patient today able to sit up to EOB with max A and needing mod A for sitting balance and support for back pain.  Patient will benefit from skilled PT in the acute setting and from HHPT to resume at [facility] upon d/c.         If plan is discharge home, recommend the following: A lot of help with bathing/dressing/bathroom;Two people to help with walking and/or transfers   Can travel by private vehicle        Equipment Recommendations None recommended by PT  Recommendations for Other Services       Functional Status Assessment Patient has had a recent decline in their functional status and/or demonstrates limited ability to make significant improvements in function in a reasonable and predictable amount of time     Precautions / Restrictions Precautions Precautions: Fall Recall of Precautions/Restrictions: Intact      Mobility  Bed Mobility Overal bed mobility: Needs Assistance Bed Mobility: Sit to Sidelying, Supine to Sit     Supine to sit: HOB elevated, Max assist   Sit to sidelying: Total assist General bed mobility comments: assist for legs off EOB and to lift trunk, pt helping move legs to EOB;  assist to side for all aspects, RN in to help boost up to [facility]    Transfers                   General transfer comment: pt declined further mobility after up to EOB    Ambulation/Gait                  Stairs            Wheelchair Mobility     Tilt Bed    Modified Rankin (Stroke Patients Only)       Balance Overall balance assessment: Needs assistance Sitting-balance support: Feet unsupported Sitting balance-Leahy Scale: Poor Sitting balance - Comments: mod A for back support for balance and for pain relief                                     Pertinent Vitals/Pain Pain Assessment Pain Assessment: Faces Faces Pain Scale: Hurts worst Pain Location: back pain with supine to sit Pain Descriptors / Indicators: Moaning, Discomfort, Grimacing, Guarding Pain Intervention(s): Monitored during session, Repositioned, Limited activity within patient's tolerance    Home Living Family/patient expects to be discharged to:: Assisted living [facility])                   Additional Comments: was at ALF and reports limited time up OOB and minimal therapy there    Prior Function Prior Level of Function : Needs  assist             Mobility Comments: assist up to wheelchair per pt (poor historian) ADLs Comments: reports even unable to feed herself well due to shoulder limitations (finger feeds)     Extremity/Trunk Assessment   Upper Extremity Assessment Upper Extremity Assessment: RUE deficits/detail;LUE deficits/detail RUE Deficits / Details: AAROM shoulder flexion/abduction only to about 20 degrees reports history of rotator cuff issues; elbow flexion/ext ROM WFL, strength 3+/5, weak grip RUE Sensation: decreased light touch;history of peripheral neuropathy LUE Deficits / Details: noted rash under arm and painful with attempts to move shoulder so limited, ROM elbow WFL, strength 3+/5, grip stronger than R LUE Sensation: decreased  light touch;history of peripheral neuropathy    Lower Extremity Assessment Lower Extremity Assessment: RLE deficits/detail;LLE deficits/detail RLE Deficits / Details: AROM WFL, strength hip flexion 3-/5, knee extension 4-/5 LLE Deficits / Details: AROM WFL, strength hip flexion 3-/5, knee extension 4-/5    Cervical / Trunk Assessment Cervical / Trunk Assessment: Kyphotic;Lordotic  Communication   Communication Communication: No apparent difficulties    Cognition Arousal: Alert Behavior During Therapy: WFL for tasks assessed/performed   PT - Cognitive impairments: History of cognitive impairments, No family/caregiver present to determine baseline                         Following commands: Impaired Following commands impaired: Follows one step commands inconsistently, Follows one step commands with increased time     Cueing Cueing Techniques: Verbal cues, Tactile cues, Gestural cues     General Comments General comments (skin integrity, edema, etc.): on 4L O2 SpO2 98%, up at EOB, able to reach cup on table close to her and pick up with both hands while sitting to sip water     Exercises     Assessment/Plan    PT Assessment Patient needs continued PT services  PT Problem List Decreased strength;Decreased range of motion;Decreased activity tolerance;Decreased balance;Decreased mobility       PT Treatment Interventions Functional mobility training;Therapeutic activities;Therapeutic exercise;Balance training;Patient/family education    PT Goals (Current goals can be found in the Care Plan section)  Acute Rehab PT Goals Patient Stated Goal: return to ALF PT Goal Formulation: With patient Time For Goal Achievement: 06/11/24 Potential to Achieve Goals: Fair    Frequency Min 1X/week     Co-evaluation               AM-PAC PT 6 Clicks Mobility  Outcome Measure Help needed turning from your back to your side while in a flat bed without using bedrails?:  Total Help needed moving from lying on your back to sitting on the side of a flat bed without using bedrails?: Total Help needed moving to and from a bed to a chair (including a wheelchair)?: Total Help needed standing up from a chair using your arms (e.g., wheelchair or bedside chair)?: Total Help needed to walk in hospital room?: Total Help needed climbing 3-5 steps with a railing? : Total 6 Click Score: 6    End of Session   Activity Tolerance: Patient limited by pain Patient left: in bed;with call bell/phone within reach;with bed alarm set   PT Visit Diagnosis: Muscle weakness (generalized) (M62.81);Other abnormalities of gait and mobility (R26.89)    Time: 8770-8754 PT Time Calculation (min) (ACUTE ONLY): 16 min   Charges:   PT Evaluation $PT Eval Moderate Complexity: 1 Mod   PT General Charges $$ ACUTE PT VISIT: 1 Visit  Micheline Portal, PT Acute Rehabilitation Services Office:[phone] 05/28/2024   Montie Portal 05/28/2024, 1:54 PM

## 2024-05-28 NOTE — Progress Notes (Signed)
   05/28/24 1128  TOC Brief Assessment  Insurance and Status Reviewed  Patient has primary care physician Yes  Home environment has been reviewed [facility]  Prior level of function: unknown awaiting PT eval patient has dementia not good source for info  Prior/Current Home Services No current home services  Social Drivers of Health Review SDOH reviewed needs interventions (Will follow for necessary interventions)  Readmission risk has been reviewed Yes  Transition of care needs transition of care needs identified, TOC will continue to follow   Patient currently in observation. IP care management will follow due to patient being from [facility] and will need to determine appropriate level of care after PT evaluation.

## 2024-05-28 NOTE — Progress Notes (Signed)
 PROGRESS NOTE    Brittany Marks  FMW:[id number] DOB: May 14, 1941 DOA: 05/27/2024 PCP: Freddrick, No   Brief Narrative:  HPI: Brittany Marks is a 83 y.o. 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  Patient apparently lived on her own until relatively recently, currently lives at a facility and staff noted that she had an odor and area of erythema in the left armpit going down towards her elbow.  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  Later, when I tell her that she has an infection on her arm and needs to be admitted to the hospital, she tells me she did not know anything about that and does not know why she is here in the hospital.  In any case, she denies any chest pain, shortness of breath, nausea, vomiting or any other concerns.  Workup in the emergency department including CT scan and left upper extremity Doppler study has no acute findings, but on examination there is certainly concern for an uncomplicated cellulitis.  She was given a dose of IV Rocephin  and hospitalist admission was requested.   Assessment & Plan:   Principal Problem:   Cellulitis Active Problems:   Cellulitis of left upper extremity   Pressure injury of skin  Cellulitis left upper extremity-without evidence of sepsis, or other complication such as abscess.  Cellulitis is mainly involving your left armpit.  Continue Ancef .  Nystatin  powder.   COPD-no evidence of exacerbation, continue bronchodilators.   Chronic combined systolic and diastolic congestive heart  failure-last echo done in March 2025 shows 40 to 45% ejection fraction with grade 2 diastolic dysfunction.  Appears euvolemic clinically.   Chemotherapy-induced neuropathy-gabapentin    Metastatic small cell lung cancer-on palliative chemo, Dr. Sherrod added to inpatient treatment team, no oncologic need for inpatient consult  Hypokalemia: Will replenish.   Hyperlipidemia-Lipitor  DVT prophylaxis: enoxaparin  (LOVENOX ) injection 40 mg Start: 05/28/24 1000   Code Status: Limited: Do not attempt resuscitation (DNR) -DNR-LIMITED -Do Not Intubate/DNI   Family Communication:  None present at bedside.  Plan of care discussed with patient in length and he/she verbalized understanding and agreed with it.  Status is: Inpatient Remains inpatient appropriate because: Needs IV antibiotics.   Estimated body mass index is 20.7 kg/m as calculated from the following:   Height as of this encounter: 5' 3 (1.6 m).   Weight as of this encounter: 53 kg.    Nutritional Assessment: Body mass index is 20.7 kg/m.SABRA Seen by dietician.  I agree with the assessment and plan as outlined below: Nutrition Status:        . Skin Assessment: I have examined the patient's skin and I agree with the wound assessment as performed by the wound care RN as outlined below:    Consultants:  None  Procedures:  None  Antimicrobials:  Anti-infectives (From admission, onward)    Start     Dose/Rate Route Frequency Ordered Stop   05/28/24 0400  ceFAZolin  (ANCEF ) IVPB 2g/100 mL premix        2 g 200 mL/hr over 30 Minutes  Intravenous Every 8 hours 05/27/24 2121 06/04/24 0559   05/27/24 2045  cefTRIAXone  (ROCEPHIN ) 2 g in sodium chloride  0.9 % 100 mL IVPB        2 g 200 mL/hr over 30 Minutes Intravenous  Once 05/27/24 2035 05/27/24 2129   05/27/24 1515  fluconazole  (DIFLUCAN ) tablet 150 mg  Status:  Discontinued        150 mg Oral  Once 05/27/24 1507 05/27/24 1534   05/27/24 1515  cephALEXin  (KEFLEX ) capsule 500  mg  Status:  Discontinued        500 mg Oral  Once 05/27/24 1507 05/27/24 1534         Subjective: Patient seen and examined, she has no complaints at all.  She is fully alert and oriented.  Objective: Vitals:   05/27/24 2215 05/28/24 0118 05/28/24 0210 05/28/24 0612  BP: (!) 154/63  (!) 141/62 (!) 131/52  Pulse: 83  77 73  Resp: 14  16 16   Temp: (!) 97.5 F (36.4 C)  (!) 97.5 F (36.4 C) 97.6 F (36.4 C)  TempSrc: Oral  Oral Oral  SpO2: 100%  100% 100%  Weight:  53 kg    Height:  5' 3 (1.6 m)      Intake/Output Summary (Last 24 hours) at 05/28/2024 1321 Last data filed at 05/28/2024 1103 Gross per 24 hour  Intake 360 ml  Output 700 ml  Net -340 ml   Filed Weights   05/28/24 0118  Weight: 53 kg    Examination:  General exam: Appears calm and comfortable  Respiratory system: Clear to auscultation. Respiratory effort normal. Cardiovascular system: S1 & S2 heard, RRR. No JVD, murmurs, rubs, gallops or clicks. No pedal edema. Gastrointestinal system: Abdomen is nondistended, soft and nontender. No organomegaly or masses felt. Normal bowel sounds heard. Central nervous system: Alert and oriented. No focal neurological deficits. Extremities: Symmetric 5 x 5 power. Skin: Erythema involving the left armpit. Psychiatry: Judgement and insight appear normal. Mood & affect appropriate.    Data Reviewed: I have personally reviewed following labs and imaging studies  CBC: Recent Labs  Lab 05/27/24 1548 05/28/24 0625  WBC 15.2* 11.5*  NEUTROABS 13.0*  --   HGB 10.4* 9.6*  HCT 32.3* 29.9*  MCV 94.4 95.2  PLT 404* 381   Basic Metabolic Panel: Recent Labs  Lab 05/27/24 1548 05/28/24 0625  NA 136 138  K 3.7 3.3*  CL 106 107  CO2 21* 22  GLUCOSE 117* 84  BUN 23 19  CREATININE 0.96 0.87  CALCIUM  8.7* 8.4*   GFR: Estimated Creatinine Clearance: 40.5 mL/min (by C-G formula based on SCr of 0.87 mg/dL). Liver Function Tests: Recent Labs  Lab 05/27/24 1548   AST 18  ALT 13  ALKPHOS 74  BILITOT 0.7  PROT 6.8  ALBUMIN 2.6*   No results for input(s): LIPASE, AMYLASE in the last 168 hours. No results for input(s): AMMONIA in the last 168 hours. Coagulation Profile: Recent Labs  Lab 05/27/24 1548  INR 1.2   Cardiac Enzymes: No results for input(s): CKTOTAL, CKMB, CKMBINDEX, TROPONINI in the last 168 hours. BNP (last 3 results) No results for input(s): PROBNP in the last 8760 hours. HbA1C: No results for input(s): HGBA1C in the last 72 hours. CBG: No results for input(s): GLUCAP in the last 168 hours. Lipid Profile: No results for input(s): CHOL, HDL, LDLCALC, TRIG, CHOLHDL, LDLDIRECT in the last 72 hours. Thyroid  Function Tests: No results for input(s): TSH, T4TOTAL, FREET4, T3FREE, THYROIDAB in  the last 72 hours. Anemia Panel: No results for input(s): VITAMINB12, FOLATE, FERRITIN, TIBC, IRON, RETICCTPCT in the last 72 hours. Sepsis Labs: Recent Labs  Lab 05/27/24 1633  LATICACIDVEN 0.4*    Recent Results (from the past 240 hours)  Blood Culture (routine x 2)     Status: None (Preliminary result)   Collection Time: 05/27/24  4:19 PM   Specimen: BLOOD  Result Value Ref Range Status   Specimen Description   Final    BLOOD PORTA CATH Performed at [facility], [address] [place], [place] [address]    Special Requests   Final    BOTTLES DRAWN AEROBIC AND ANAEROBIC Blood Culture adequate volume Performed at [facility], [address] [place], [place] [address]    Culture   Final    NO GROWTH < 12 HOURS Performed at [facility], [address], [place], [place] [address]    Report Status PENDING  Incomplete     Radiology Studies: UE VENOUS DUPLEX (7am - 7pm) Result Date: 05/27/2024 UPPER VENOUS STUDY  Patient Name:  ALEKHYA GRAVLIN  Date of Exam:   05/27/2024 Medical Rec #: [id number]          Accession #:     [id number] Date of Birth: 1941/05/27          Patient Gender: F Patient Age:   78 years Exam Location:  [facility] Procedure:      VAS US  UPPER EXTREMITY VENOUS DUPLEX Referring Phys: BURGESS NANAVATI --------------------------------------------------------------------------------  Indications: Edema, and redness, cancer hx Limitations: Painful axilla area with purulent drainage. Performing Technologist: Elmarie Lindau, RVT  Examination Guidelines: A complete evaluation includes B-mode imaging, spectral Doppler, color Doppler, and power Doppler as needed of all accessible portions of each vessel. Bilateral testing is considered an integral part of a complete examination. Limited examinations for reoccurring indications may be performed as noted.  Left Findings: +----------+------------+---------+-----------+----------+--------------+ LEFT      CompressiblePhasicitySpontaneousProperties   Summary     +----------+------------+---------+-----------+----------+--------------+ IJV           Full                                                 +----------+------------+---------+-----------+----------+--------------+ Subclavian               Yes                                       +----------+------------+---------+-----------+----------+--------------+ Axillary                                            Not visualized +----------+------------+---------+-----------+----------+--------------+ Brachial      Full                                                 +----------+------------+---------+-----------+----------+--------------+ Radial        Full                                                 +----------+------------+---------+-----------+----------+--------------+  Ulnar         Full                                                 +----------+------------+---------+-----------+----------+--------------+ Cephalic      Full                                                  +----------+------------+---------+-----------+----------+--------------+ Basilic       Full                                                 +----------+------------+---------+-----------+----------+--------------+  Summary:  Left: Limited exam, however there is no evidence of left upper extremity DVT/SVT in areas visualized.  *See table(s) above for measurements and observations.  Diagnosing physician: Gaile New MD Electronically signed by Gaile New MD on 05/27/2024 at 10:46:08 PM.    Final    CT Chest W Contrast Result Date: 05/27/2024 CLINICAL DATA:  Left axilla swelling and pain. History of lung cancer. EXAM: CT CHEST WITH CONTRAST TECHNIQUE: Multidetector CT imaging of the chest was performed during intravenous contrast administration. RADIATION DOSE REDUCTION: This exam was performed according to the departmental dose-optimization program which includes automated exposure control, adjustment of the mA and/or kV according to patient size and/or use of iterative reconstruction technique. CONTRAST:  75mL OMNIPAQUE  IOHEXOL  300 MG/ML  SOLN COMPARISON:  CT chest 03/18/2024 FINDINGS: Cardiovascular: No significant vascular findings. Normal heart size. No pericardial effusion. Right chest port catheter tip ends in the right atrium. Mediastinum/Nodes: There are enlarged left hilar lymph nodes measuring up to 14 mm. Left hilar lymphadenopathy has slightly decreased in the interval. No new enlarged lymph nodes are identified. Visualized esophagus and thyroid  gland are within normal limits.There is no axillary lymphadenopathy Lungs/Pleura: Peripheral interstitial opacities are again seen scattered throughout both lungs. There are fibrotic changes in both lung bases. Bilateral lower lobe airspace disease has significantly decreased/resolved. Focal cavitary airspace opacity in the left upper lobe has decreased in size, but cavitation has increased. This now measures 2.7 By 4.6 Cm (previously 3.7  by 3.0 Cm). No new focal lung infiltrate identified. No pleural effusion or pneumothorax. Trachea and central airways are patent. Upper Abdomen: Prominent right extrarenal pelvis is partially imaged and grossly unchanged. Musculoskeletal: Degenerative changes affect the spine. Left shoulder arthroplasty is present. No acute fractures are seen. IMPRESSION: 1. Left upper lobe cavitary lesion has decreased in size, but cavitation has increased. 2. Left hilar lymphadenopathy has slightly decreased in the interval. 3. Bilateral lower lobe airspace disease has significantly decreased/resolved. 4. 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 5. No new focal lung infiltrate. 6. No acute fractures are seen. Electronically Signed   By: Greig Pique M.D.   On: 05/27/2024 18:37   DG Chest Port 1 View Result Date: 05/27/2024 CLINICAL DATA:  Questionable sepsis EXAM: PORTABLE CHEST 1 VIEW COMPARISON:  Chest x-ray 03/25/2024.  CT of the chest 03/18/2024. FINDINGS: Right chest port catheter tip ends in the distal SVC. The heart is enlarged. There are atherosclerotic calcifications of the aorta.  Prominence of the upper right paratracheal region corresponds to ectatic vasculature. There are new patchy multifocal airspace opacities throughout both lungs, left greater than right. There is no pleural effusion or pneumothorax. The osseous structures are stable no acute fractures are seen. IMPRESSION: 1. New patchy multifocal airspace opacities throughout both lungs, left greater than right, concerning for multifocal pneumonia. 2. Cardiomegaly. Electronically Signed   By: Greig Pique M.D.   On: 05/27/2024 15:36    Scheduled Meds:  aspirin  EC  81 mg Oral Daily   atorvastatin   40 mg Oral Daily   Chlorhexidine  Gluconate Cloth  6 each Topical Daily   enoxaparin  (LOVENOX ) injection  40 mg Subcutaneous Q24H   feeding supplement  237 mL Oral BID BM   gabapentin   100 mg Oral BID   nystatin    Topical  TID   potassium chloride   40 mEq Oral Q4H   sodium chloride  flush  10-40 mL Intracatheter Q12H   Continuous Infusions:   ceFAZolin  (ANCEF ) IV 2 g (05/28/24 0331)     LOS: 0 days   Fredia Skeeter, MD [facility]  05/28/2024, 1:21 PM   *Please note that this is a verbal dictation therefore any spelling or grammatical errors are due to the Dragon Medical One system interpretation.  Please page via Amion and do not message via secure chat for urgent patient care matters. Secure chat can be used for non urgent patient care matters.  How to contact the [facility] Attending or Consulting provider 7A - 7P or covering provider during after hours 7P -7A, for this patient?  Check the care team in [facility] and look for a) attending/consulting [facility] provider listed and b) the [facility] team listed. Page or secure chat 7A-7P. Log into [email or web address] and use [place]'s universal password to access. If you do not have the password, please contact the hospital operator. Locate the [facility] provider you are looking for under [facility] and page to a number that you can be directly reached. If you still have difficulty reaching the provider, please page the [facility] (Director on Call) for the Hospitalists listed on amion for assistance.

## 2024-05-28 NOTE — Plan of Care (Signed)
   Problem: Education: Goal: Knowledge of General Education information will improve Description Including pain rating scale, medication(s)/side effects and non-pharmacologic comfort measures Outcome: Progressing   Problem: Health Behavior/Discharge Planning: Goal: Ability to manage health-related needs will improve Outcome: Progressing

## 2024-05-29 DIAGNOSIS — L03114 Cellulitis of left upper limb: Secondary | ICD-10-CM | POA: Diagnosis not present

## 2024-05-29 LAB — CBC
HCT: 32.1 % — ABNORMAL LOW (ref 36.0–46.0)
Hemoglobin: 9.7 g/dL — ABNORMAL LOW (ref 12.0–15.0)
MCH: 29 pg (ref 26.0–34.0)
MCHC: 30.2 g/dL (ref 30.0–36.0)
MCV: 95.8 fL (ref 80.0–100.0)
Platelets: 401 K/uL — ABNORMAL HIGH (ref 150–400)
RBC: 3.35 MIL/uL — ABNORMAL LOW (ref 3.87–5.11)
RDW: 13.5 % (ref 11.5–15.5)
WBC: 12 K/uL — ABNORMAL HIGH (ref 4.0–10.5)
nRBC: 0 % (ref 0.0–0.2)

## 2024-05-29 LAB — BASIC METABOLIC PANEL WITH GFR
Anion gap: 8 (ref 5–15)
BUN: 16 mg/dL (ref 8–23)
CO2: 21 mmol/L — ABNORMAL LOW (ref 22–32)
Calcium: 8.9 mg/dL (ref 8.9–10.3)
Chloride: 109 mmol/L (ref 98–111)
Creatinine, Ser: 0.81 mg/dL (ref 0.44–1.00)
GFR, Estimated: >60 mL/min (ref >60)
Glucose, Bld: 88 mg/dL (ref 70–99)
Potassium: 4 mmol/L (ref 3.5–5.1)
Sodium: 138 mmol/L (ref 135–145)

## 2024-05-29 MED ORDER — CEPHALEXIN 500 MG PO CAPS: 500.0000 mg | ORAL_CAPSULE | Freq: Three times a day (TID) | ORAL | 0 refills | Status: AC

## 2024-05-29 MED ORDER — NYSTATIN 100000 UNIT/GM EX POWD: Freq: Three times a day (TID) | CUTANEOUS | 0 refills | Status: DC

## 2024-05-29 NOTE — Plan of Care (Signed)
  Problem: Education: Goal: Knowledge of General Education information will improve Description: Including pain rating scale, medication(s)/side effects and non-pharmacologic comfort measures Outcome: Progressing   Problem: Clinical Measurements: Goal: Ability to maintain clinical measurements within normal limits will improve Outcome: Progressing   Problem: Nutrition: Goal: Adequate nutrition will be maintained Outcome: Progressing   Problem: Elimination: Goal: Will not experience complications related to bowel motility Outcome: Progressing Goal: Will not experience complications related to urinary retention Outcome: Progressing   Problem: Pain Managment: Goal: General experience of comfort will improve and/or be controlled Outcome: Progressing   Problem: Safety: Goal: Ability to remain free from injury will improve Outcome: Progressing

## 2024-05-29 NOTE — Discharge Summary (Signed)
 Physician Discharge Summary  Brittany Marks FMW:[id number] DOB: 1941/04/03 DOA: 05/27/2024  PCP: Pcp, No  Admit date: 05/27/2024 Discharge date: 05/29/2024 30 Day Unplanned Readmission Risk Score    Flowsheet Row ED to Hosp-Admission (Current) from 05/27/2024 in [facility]  30 Day Unplanned Readmission Risk Score (%) 15.37 Filed at 05/29/2024 0801    This score is the patient's risk of an unplanned readmission within 30 days of being discharged (0 -100%). The score is based on dignosis, age, lab data, medications, orders, and past utilization.   Low:  0-14.9   Medium: 15-21.9   High: 22-29.9   Extreme: 30 and above          Admitted From: Home Disposition: Home  Recommendations for Outpatient Follow-up:  Follow up with PCP in 1-2 weeks Please obtain BMP/CBC in one week Please follow up with your PCP on the following pending results: Unresulted Labs (From admission, onward)    None         Home Health: Yes Equipment/Devices: None  Discharge Condition:  stable CODE STATUS: Full code Diet recommendation:  Diet Order             Diet regular Room service appropriate? Yes; Fluid consistency: Thin  Diet effective now                 HPI: Brittany Marks is a 83 y.o. 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  Patient apparently lived on her own until relatively recently, currently lives at a facility and staff noted that she had an odor and area of erythema in the left armpit going down towards her elbow.  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  Later, when I tell her that she has an infection on her arm and needs to be admitted to the hospital, she tells me she did not know anything about that and does not know why she is here in the hospital.  In any case, she denies any chest pain, shortness of breath, nausea, vomiting or any other concerns.  Workup in the emergency department including CT scan and left upper extremity Doppler study has no acute findings, but on examination there is certainly concern for an uncomplicated cellulitis.  She was given a dose of IV Rocephin  and hospitalist admission was requested.   Subjective: Patient seen and examined, she has no complaint at all.  Her cellulitis in the left armpit also looks much improved.  Discussed plan of discharge with her.  She says she does not want to go back to the assisted living facility because she believes that she is not being taking care of.  She tells me that she has been there for about 1-1/36-month and her daughter arrange that for her.  She states that she wants to go home instead.  Lengthy discussion with the patient that unfortunately I cannot discharge her home and that she will have to go back to the assisted living facility.  I encouraged her to have candid discussion with her daughter about her  opinion and feelings about the ALF and that she can arrange transfer to a different assisted living facility from there.  Brief/Interim Summary: Details of the hospitalization as below.  Cellulitis left upper extremity/armpit-without evidence of sepsis, or other complication such as abscess.  Cellulitis is mainly involving your left armpit.  There appeared to be some fungal superinfection as well.  She was treated with Ancef  as well as nystatin  powder.  This has improved significantly to the point that her antibiotics can be switched to oral and she can be discharged home.  Sending her home on Keflex  for 7 days along with nystatin  powder.    COPD-no evidence of exacerbation, continue bronchodilators.  She has chronic hypoxic respiratory failure, she is oxygen  dependent chronically.   Chronic combined systolic and diastolic congestive heart failure-last echo done in March 2025 shows 40 to 45% ejection fraction with grade 2 diastolic dysfunction.  Appears euvolemic clinically.  Resume all PTA medications.   Chemotherapy-induced neuropathy-gabapentin    Metastatic small cell lung cancer-on palliative chemo, Dr. Sherrod added to inpatient treatment team, no oncologic need for inpatient consult   Hypokalemia: Resolved   Hyperlipidemia-Lipitor  Discharge plan was discussed with patient and/or family member and they verbalized understanding and agreed with it.  Discharge Diagnoses:  Principal Problem:   Cellulitis Active Problems:   Cellulitis of left upper extremity   Pressure injury of skin    Discharge Instructions   Allergies as of 05/29/2024       Reactions   Tape Other (See Comments)   SKIN IS DELICATE!!   Oxycodone  Other (See Comments)   Unsteady gait, fall, altered mental status        Medication List     TAKE these medications    acetaminophen  325 MG tablet Commonly known as: TYLENOL  Take 2 tablets (650 mg total) by mouth every 4 (four) hours as needed for mild pain (pain score 1-3) (or temp > 37.5 C (99.5 F)).   albuterol  108 (90 Base) MCG/ACT inhaler Commonly known as: VENTOLIN  HFA Inhale 2 puffs into the lungs every 6 (six) hours as needed for wheezing or shortness of breath.   aspirin  EC 81 MG tablet Take 1 tablet (81 mg total) by mouth daily. Swallow whole.   atorvastatin  40 MG tablet Commonly known as: LIPITOR Take 1 tablet (40 mg total) by mouth daily.   b complex vitamins tablet Take 1 tablet by mouth daily.   cephALEXin  500 MG capsule Commonly known as: KEFLEX  Take 1 capsule (500 mg total) by mouth 3 (three) times daily for 7 days.   durvalumab  1,500 mg in sodium chloride  0.9 %  100 mL Inject 1,500 mg into the vein every 28 (twenty-eight) days.   gabapentin  100 MG capsule Commonly known as: NEURONTIN  Take 1 capsule (100 mg total) by mouth 2 (two) times daily. What changed:  when to take this additional instructions   ibuprofen 600 MG tablet Commonly known as: ADVIL Take 600 mg by mouth 2 (two) times daily as needed.   multivitamin tablet Take 1 tablet by mouth daily with breakfast.   nicotine  14 mg/24hr patch Commonly known as: NICODERM CQ  - dosed in mg/24 hours Place 14 mg onto the skin daily as needed (for the urge to smoke while hospitalized).   nystatin  powder Commonly known as: MYCOSTATIN /NYSTOP  Apply topically 3 (three) times daily.   ondansetron  4 MG tablet Commonly known as: Zofran  Take 1 tablet (4 mg total) by mouth every 8 (eight) hours as needed for nausea or  vomiting.   oxymetazoline 0.05 % nasal spray Commonly known as: AFRIN Place 2 sprays into both nostrils 2 (two) times daily as needed (for a bloody nose).   senna-docusate 8.6-50 MG tablet Commonly known as: Senokot-S Take 1 tablet by mouth at bedtime as needed for mild constipation.   VITAMIN C PO Take 1 tablet by mouth daily.        Follow-up Information     PCP Follow up in 1 week(s).                 Allergies  Allergen Reactions   Tape Other (See Comments)    SKIN IS DELICATE!!   Oxycodone  Other (See Comments)    Unsteady gait, fall, altered mental status    Consultations: None   Procedures/Studies: UE VENOUS DUPLEX (7am - 7pm) Result Date: 05/27/2024 UPPER VENOUS STUDY  Patient Name:  Brittany Marks  Date of Exam:   05/27/2024 Medical Rec #: [id number]          Accession #:    [id number] Date of Birth: 11-29-1940          Patient Gender: F Patient Age:   7 years Exam Location:  [facility] Procedure:      VAS US  UPPER EXTREMITY VENOUS DUPLEX Referring Phys: BURGESS NANAVATI  --------------------------------------------------------------------------------  Indications: Edema, and redness, cancer hx Limitations: Painful axilla area with purulent drainage. Performing Technologist: Elmarie Lindau, RVT  Examination Guidelines: A complete evaluation includes B-mode imaging, spectral Doppler, color Doppler, and power Doppler as needed of all accessible portions of each vessel. Bilateral testing is considered an integral part of a complete examination. Limited examinations for reoccurring indications may be performed as noted.  Left Findings: +----------+------------+---------+-----------+----------+--------------+ LEFT      CompressiblePhasicitySpontaneousProperties   Summary     +----------+------------+---------+-----------+----------+--------------+ IJV           Full                                                 +----------+------------+---------+-----------+----------+--------------+ Subclavian               Yes                                       +----------+------------+---------+-----------+----------+--------------+ Axillary                                            Not visualized +----------+------------+---------+-----------+----------+--------------+ Brachial      Full                                                 +----------+------------+---------+-----------+----------+--------------+ Radial        Full                                                 +----------+------------+---------+-----------+----------+--------------+ Ulnar         Full                                                 +----------+------------+---------+-----------+----------+--------------+  Cephalic      Full                                                 +----------+------------+---------+-----------+----------+--------------+ Basilic       Full                                                  +----------+------------+---------+-----------+----------+--------------+  Summary:  Left: Limited exam, however there is no evidence of left upper extremity DVT/SVT in areas visualized.  *See table(s) above for measurements and observations.  Diagnosing physician: Gaile New MD Electronically signed by Gaile New MD on 05/27/2024 at 10:46:08 PM.    Final    CT Chest W Contrast Result Date: 05/27/2024 CLINICAL DATA:  Left axilla swelling and pain. History of lung cancer. EXAM: CT CHEST WITH CONTRAST TECHNIQUE: Multidetector CT imaging of the chest was performed during intravenous contrast administration. RADIATION DOSE REDUCTION: This exam was performed according to the departmental dose-optimization program which includes automated exposure control, adjustment of the mA and/or kV according to patient size and/or use of iterative reconstruction technique. CONTRAST:  75mL OMNIPAQUE  IOHEXOL  300 MG/ML  SOLN COMPARISON:  CT chest 03/18/2024 FINDINGS: Cardiovascular: No significant vascular findings. Normal heart size. No pericardial effusion. Right chest port catheter tip ends in the right atrium. Mediastinum/Nodes: There are enlarged left hilar lymph nodes measuring up to 14 mm. Left hilar lymphadenopathy has slightly decreased in the interval. No new enlarged lymph nodes are identified. Visualized esophagus and thyroid  gland are within normal limits.There is no axillary lymphadenopathy Lungs/Pleura: Peripheral interstitial opacities are again seen scattered throughout both lungs. There are fibrotic changes in both lung bases. Bilateral lower lobe airspace disease has significantly decreased/resolved. Focal cavitary airspace opacity in the left upper lobe has decreased in size, but cavitation has increased. This now measures 2.7 By 4.6 Cm (previously 3.7 by 3.0 Cm). No new focal lung infiltrate identified. No pleural effusion or pneumothorax. Trachea and central airways are patent. Upper Abdomen: Prominent right  extrarenal pelvis is partially imaged and grossly unchanged. Musculoskeletal: Degenerative changes affect the spine. Left shoulder arthroplasty is present. No acute fractures are seen. IMPRESSION: 1. Left upper lobe cavitary lesion has decreased in size, but cavitation has increased. 2. Left hilar lymphadenopathy has slightly decreased in the interval. 3. Bilateral lower lobe airspace disease has significantly decreased/resolved. 4. 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 5. No new focal lung infiltrate. 6. No acute fractures are seen. Electronically Signed   By: Greig Pique M.D.   On: 05/27/2024 18:37   DG Chest Port 1 View Result Date: 05/27/2024 CLINICAL DATA:  Questionable sepsis EXAM: PORTABLE CHEST 1 VIEW COMPARISON:  Chest x-ray 03/25/2024.  CT of the chest 03/18/2024. FINDINGS: Right chest port catheter tip ends in the distal SVC. The heart is enlarged. There are atherosclerotic calcifications of the aorta. Prominence of the upper right paratracheal region corresponds to ectatic vasculature. There are new patchy multifocal airspace opacities throughout both lungs, left greater than right. There is no pleural effusion or pneumothorax. The osseous structures are stable no acute fractures are seen. IMPRESSION: 1. New patchy multifocal airspace opacities throughout both lungs, left greater than right, concerning for multifocal  pneumonia. 2. Cardiomegaly. Electronically Signed   By: Greig Pique M.D.   On: 05/27/2024 15:36     Discharge Exam: Vitals:   05/28/24 2159 05/29/24 0705  BP: 117/77 126/77  Pulse: 83 84  Resp: 18 18  Temp: 98.7 F (37.1 C) (!) 97.5 F (36.4 C)  SpO2: 95% 96%   Vitals:   05/28/24 0612 05/28/24 1421 05/28/24 2159 05/29/24 0705  BP: (!) 131/52 132/66 117/77 126/77  Pulse: 73 88 83 84  Resp: 16 16 18 18   Temp: 97.6 F (36.4 C) 97.8 F (36.6 C) 98.7 F (37.1 C) (!) 97.5 F (36.4 C)  TempSrc: Oral Oral Oral Oral  SpO2: 100% 93%  95% 96%  Weight:      Height:        General: Pt is alert, awake, not in acute distress Cardiovascular: RRR, S1/S2 +, no rubs, no gallops Respiratory: CTA bilaterally, no wheezing, no rhonchi Abdominal: Soft, NT, ND, bowel sounds + Extremities: no edema, no cyanosis, very mild erythema at the left armpit    The results of significant diagnostics from this hospitalization (including imaging, microbiology, ancillary and laboratory) are listed below for reference.     Microbiology: Recent Results (from the past 240 hours)  Blood Culture (routine x 2)     Status: None (Preliminary result)   Collection Time: 05/27/24  4:19 PM   Specimen: BLOOD  Result Value Ref Range Status   Specimen Description   Final    BLOOD PORTA CATH Performed at [facility], [address], [place], [place] [address]    Special Requests   Final    BOTTLES DRAWN AEROBIC AND ANAEROBIC Blood Culture adequate volume Performed at [facility], [address] [place], [place] [address]    Culture  Setup Time   Final    GRAM POSITIVE COCCI IN CLUSTERS AEROBIC BOTTLE ONLY CRITICAL RESULT CALLED TO, READ BACK BY AND VERIFIED WITH: PHARMD BARD MOHR [id number] AT 1730, ADC Performed at [facility], [address], [place], [place] [address]    Culture GRAM POSITIVE COCCI  Final   Report Status PENDING  Incomplete  Blood Culture ID Panel (Reflexed)     Status: Abnormal   Collection Time: 05/27/24  4:19 PM  Result Value Ref Range Status   Enterococcus faecalis NOT DETECTED NOT DETECTED Final   Enterococcus Faecium NOT DETECTED NOT DETECTED Final   Listeria monocytogenes NOT DETECTED NOT DETECTED Final   Staphylococcus species DETECTED (A) NOT DETECTED Final    Comment: CRITICAL RESULT CALLED TO, READ BACK BY AND VERIFIED WITH: PHARMD DREW W. [id number] AT 1730, ADC    Staphylococcus aureus (BCID) NOT DETECTED NOT DETECTED Final   Staphylococcus epidermidis NOT DETECTED NOT  DETECTED Final   Staphylococcus lugdunensis NOT DETECTED NOT DETECTED Final   Streptococcus species NOT DETECTED NOT DETECTED Final   Streptococcus agalactiae NOT DETECTED NOT DETECTED Final   Streptococcus pneumoniae NOT DETECTED NOT DETECTED Final   Streptococcus pyogenes NOT DETECTED NOT DETECTED Final   A.calcoaceticus-baumannii NOT DETECTED NOT DETECTED Final   Bacteroides fragilis NOT DETECTED NOT DETECTED Final   Enterobacterales NOT DETECTED NOT DETECTED Final   Enterobacter cloacae complex NOT DETECTED NOT DETECTED Final   Escherichia coli NOT DETECTED NOT DETECTED Final   Klebsiella aerogenes NOT DETECTED NOT DETECTED Final   Klebsiella oxytoca NOT DETECTED NOT DETECTED Final   Klebsiella pneumoniae NOT DETECTED NOT DETECTED Final   Proteus species NOT DETECTED NOT DETECTED Final  Salmonella species NOT DETECTED NOT DETECTED Final   Serratia marcescens NOT DETECTED NOT DETECTED Final   Haemophilus influenzae NOT DETECTED NOT DETECTED Final   Neisseria meningitidis NOT DETECTED NOT DETECTED Final   Pseudomonas aeruginosa NOT DETECTED NOT DETECTED Final   Stenotrophomonas maltophilia NOT DETECTED NOT DETECTED Final   Candida albicans NOT DETECTED NOT DETECTED Final   Candida auris NOT DETECTED NOT DETECTED Final   Candida glabrata NOT DETECTED NOT DETECTED Final   Candida krusei NOT DETECTED NOT DETECTED Final   Candida parapsilosis NOT DETECTED NOT DETECTED Final   Candida tropicalis NOT DETECTED NOT DETECTED Final   Cryptococcus neoformans/gattii NOT DETECTED NOT DETECTED Final    Comment: Performed at [facility], [address] [place], [place] [address]  Blood Culture (routine x 2)     Status: None (Preliminary result)   Collection Time: 05/28/24 12:25 AM   Specimen: BLOOD  Result Value Ref Range Status   Specimen Description   Final    BLOOD BLOOD RIGHT HAND Performed at [facility], [address], [place], [place] [address]    Special  Requests   Final    Blood Culture results may not be optimal due to an inadequate volume of blood received in culture bottles BOTTLES DRAWN AEROBIC ONLY Performed at [facility], [address], [place], [place] [address]    Culture   Final    NO GROWTH 1 DAY Performed at [facility], [address], [place], [place] [address]    Report Status PENDING  Incomplete     Labs: BNP (last 3 results) No results for input(s): BNP in the last 8760 hours. Basic Metabolic Panel: Recent Labs  Lab 05/27/24 1548 05/28/24 0625 05/29/24 0527  NA 136 138 138  K 3.7 3.3* 4.0  CL 106 107 109  CO2 21* 22 21*  GLUCOSE 117* 84 88  BUN 23 19 16   CREATININE 0.96 0.87 0.81  CALCIUM  8.7* 8.4* 8.9   Liver Function Tests: Recent Labs  Lab 05/27/24 1548  AST 18  ALT 13  ALKPHOS 74  BILITOT 0.7  PROT 6.8  ALBUMIN 2.6*   No results for input(s): LIPASE, AMYLASE in the last 168 hours. No results for input(s): AMMONIA in the last 168 hours. CBC: Recent Labs  Lab 05/27/24 1548 05/28/24 0625 05/29/24 0527  WBC 15.2* 11.5* 12.0*  NEUTROABS 13.0*  --   --   HGB 10.4* 9.6* 9.7*  HCT 32.3* 29.9* 32.1*  MCV 94.4 95.2 95.8  PLT 404* 381 401*   Cardiac Enzymes: No results for input(s): CKTOTAL, CKMB, CKMBINDEX, TROPONINI in the last 168 hours. BNP: Invalid input(s): POCBNP CBG: No results for input(s): GLUCAP in the last 168 hours. D-Dimer No results for input(s): DDIMER in the last 72 hours. Hgb A1c No results for input(s): HGBA1C in the last 72 hours. Lipid Profile No results for input(s): CHOL, HDL, LDLCALC, TRIG, CHOLHDL, LDLDIRECT in the last 72 hours. Thyroid  function studies No results for input(s): TSH, T4TOTAL, T3FREE, THYROIDAB in the last 72 hours.  Invalid input(s): FREET3 Anemia work up No results for input(s): VITAMINB12, FOLATE, FERRITIN, TIBC, IRON, RETICCTPCT in the last 72  hours. Urinalysis    Component Value Date/Time   COLORURINE YELLOW 03/19/2024 0224   APPEARANCEUR HAZY (A) 03/19/2024 0224   LABSPEC 1.038 (H) 03/19/2024 0224   PHURINE 6.0 03/19/2024 0224   GLUCOSEU NEGATIVE 03/19/2024 0224   HGBUR NEGATIVE 03/19/2024 0224   BILIRUBINUR  NEGATIVE 03/19/2024 0224   KETONESUR 5 (A) 03/19/2024 0224   PROTEINUR 30 (A) 03/19/2024 0224   UROBILINOGEN 0.2 07/23/2013 1332   NITRITE NEGATIVE 03/19/2024 0224   LEUKOCYTESUR TRACE (A) 03/19/2024 0224   Sepsis Labs Recent Labs  Lab 05/27/24 1548 05/28/24 0625 05/29/24 0527  WBC 15.2* 11.5* 12.0*   Microbiology Recent Results (from the past 240 hours)  Blood Culture (routine x 2)     Status: None (Preliminary result)   Collection Time: 05/27/24  4:19 PM   Specimen: BLOOD  Result Value Ref Range Status   Specimen Description   Final    BLOOD PORTA CATH Performed at [facility], [address], [place], [place] [address]    Special Requests   Final    BOTTLES DRAWN AEROBIC AND ANAEROBIC Blood Culture adequate volume Performed at [facility], [address] [place], [place] [address]    Culture  Setup Time   Final    GRAM POSITIVE COCCI IN CLUSTERS AEROBIC BOTTLE ONLY CRITICAL RESULT CALLED TO, READ BACK BY AND VERIFIED WITH: PHARMD BARD MOHR [id number] AT 1730, ADC Performed at [facility], [address], [place], [place] [address]    Culture GRAM POSITIVE COCCI  Final   Report Status PENDING  Incomplete  Blood Culture ID Panel (Reflexed)     Status: Abnormal   Collection Time: 05/27/24  4:19 PM  Result Value Ref Range Status   Enterococcus faecalis NOT DETECTED NOT DETECTED Final   Enterococcus Faecium NOT DETECTED NOT DETECTED Final   Listeria monocytogenes NOT DETECTED NOT DETECTED Final   Staphylococcus species DETECTED (A) NOT DETECTED Final    Comment: CRITICAL RESULT CALLED TO, READ BACK BY AND VERIFIED WITH: PHARMD DREW W. [id number] AT 1730, ADC     Staphylococcus aureus (BCID) NOT DETECTED NOT DETECTED Final   Staphylococcus epidermidis NOT DETECTED NOT DETECTED Final   Staphylococcus lugdunensis NOT DETECTED NOT DETECTED Final   Streptococcus species NOT DETECTED NOT DETECTED Final   Streptococcus agalactiae NOT DETECTED NOT DETECTED Final   Streptococcus pneumoniae NOT DETECTED NOT DETECTED Final   Streptococcus pyogenes NOT DETECTED NOT DETECTED Final   A.calcoaceticus-baumannii NOT DETECTED NOT DETECTED Final   Bacteroides fragilis NOT DETECTED NOT DETECTED Final   Enterobacterales NOT DETECTED NOT DETECTED Final   Enterobacter cloacae complex NOT DETECTED NOT DETECTED Final   Escherichia coli NOT DETECTED NOT DETECTED Final   Klebsiella aerogenes NOT DETECTED NOT DETECTED Final   Klebsiella oxytoca NOT DETECTED NOT DETECTED Final   Klebsiella pneumoniae NOT DETECTED NOT DETECTED Final   Proteus species NOT DETECTED NOT DETECTED Final   Salmonella species NOT DETECTED NOT DETECTED Final   Serratia marcescens NOT DETECTED NOT DETECTED Final   Haemophilus influenzae NOT DETECTED NOT DETECTED Final   Neisseria meningitidis NOT DETECTED NOT DETECTED Final   Pseudomonas aeruginosa NOT DETECTED NOT DETECTED Final   Stenotrophomonas maltophilia NOT DETECTED NOT DETECTED Final   Candida albicans NOT DETECTED NOT DETECTED Final   Candida auris NOT DETECTED NOT DETECTED Final   Candida glabrata NOT DETECTED NOT DETECTED Final   Candida krusei NOT DETECTED NOT DETECTED Final   Candida parapsilosis NOT DETECTED NOT DETECTED Final   Candida tropicalis NOT DETECTED NOT DETECTED Final   Cryptococcus neoformans/gattii NOT DETECTED NOT DETECTED Final    Comment: Performed at [facility], [address], [place], [place] [address]  Blood Culture (routine x 2)     Status: None (Preliminary result)  Collection Time: 05/28/24 12:25 AM   Specimen: BLOOD  Result Value Ref Range Status   Specimen Description   Final    BLOOD BLOOD  RIGHT HAND Performed at [facility], [address], [place], [place] [address]    Special Requests   Final    Blood Culture results may not be optimal due to an inadequate volume of blood received in culture bottles BOTTLES DRAWN AEROBIC ONLY Performed at [facility], [address] [place], [place] [address]    Culture   Final    NO GROWTH 1 DAY Performed at [facility], [address], [place], [place] [address]    Report Status PENDING  Incomplete    FURTHER DISCHARGE INSTRUCTIONS:   Get Medicines reviewed and adjusted: Please take all your medications with you for your next visit with your Primary MD   Laboratory/radiological data: Please request your Primary MD to go over all hospital tests and procedure/radiological results at the follow up, please ask your Primary MD to get all Hospital records sent to his/her office.   In some cases, they will be blood work, cultures and biopsy results pending at the time of your discharge. Please request that your primary care M.D. goes through all the records of your hospital data and follows up on these results.   Also Note the following: If you experience worsening of your admission symptoms, develop shortness of breath, life threatening emergency, suicidal or homicidal thoughts you must seek medical attention immediately by calling 911 or calling your MD immediately  if symptoms less severe.   You must read complete instructions/literature along with all the possible adverse reactions/side effects for all the Medicines you take and that have been prescribed to you. Take any new Medicines after you have completely understood and accpet all the possible adverse reactions/side effects.    patient was instructed, not to drive, operate heavy machinery, perform activities at heights, swimming or participation in water  activities or provide baby-sitting services while on Pain, Sleep and Anxiety  Medications; until their outpatient Physician has advised to do so again. Also recommended to not to take more than prescribed Pain, Sleep and Anxiety Medications.  It is not advisable to combine anxiety, sleep and pain medications without talking with your primary care provider.     Wear Seat belts while driving.   Please note: You were cared for by a hospitalist during your hospital stay. Once you are discharged, your primary care physician will handle any further medical issues. Please note that NO REFILLS for any discharge medications will be authorized once you are discharged, as it is imperative that you return to your primary care physician (or establish a relationship with a primary care physician if you do not have one) for your post hospital discharge needs so that they can reassess your need for medications and monitor your lab values  Time coordinating discharge: Over 30 minutes  SIGNED:   Fredia Skeeter, MD  [facility] 05/29/2024, 9:02 AM *Please note that this is a verbal dictation therefore any spelling or grammatical errors are due to the Dragon Medical One system interpretation. If 7PM-7AM, please contact night-coverage [email or web address]

## 2024-05-29 NOTE — TOC Progression Note (Addendum)
 Transition of Care [facility]) - Progression Note    Patient Details  Name: Brittany Marks MRN: [id number] Date of Birth: Oct 06, 1941  Transition of Care [facility]) CM/SW Contact  Toy LITTIE Agar, RN Phone Number:[phone]  05/29/2024, 3:53 PM  Clinical Narrative:    Patient is from ALF [facility]. Cm attempted to call with no answer and no option to leave a message. FL2 has been completed. Daughter has been updated. Message has been sent to RN and MD. Discharge will not happen today. CM will need to make contact with facility fax [facility] and discharge summary for review and final approval for patient to return. CM will follow up.   1600 Cm spoke with daughter Woodroe to confirm correct ALF. Woodroe states that the facility is sometimes difficult to reach. Cm will continue to reach out to facility.      Barriers to Discharge: Continued Medical Work up               Expected Discharge Plan and Services         Expected Discharge Date: 05/29/24                                     Social Drivers of Health (SDOH) Interventions SDOH Screenings   Food Insecurity: Patient Declined (05/28/2024)  Housing: Unknown (05/28/2024)  Transportation Needs: Patient Declined (05/28/2024)  Utilities: Patient Declined (05/28/2024)  Depression (PHQ2-9): Low Risk  (05/19/2024)  Social Connections: Unknown (05/28/2024)  Tobacco Use: High Risk (05/27/2024)    Readmission Risk Interventions    03/26/2024   11:55 AM 12/08/2023   12:37 PM  Readmission Risk Prevention Plan  Post Dischage Appt  Complete  Medication Screening  Complete  Transportation Screening Complete Complete  PCP or Specialist Appt within 5-7 Days Complete   Home Care Screening Complete   Medication Review (RN CM) Complete

## 2024-05-29 NOTE — NC FL2 (Addendum)
 [place]  MEDICAID FL2 LEVEL OF CARE FORM     IDENTIFICATION  Patient Name: Brittany Marks Birthdate: 18-Mar-1941 Sex: female Admission Date (Current Location): 05/27/2024  [facility] and [place] Number:  Producer, television/film/video and Address:  [facility],  [address], [place] [address]      Provider Number: [id number]  Attending Physician Name and Address:  Vernon Ranks, MD  Relative Name and Phone Number:  [name]  [phone]    Current Level of Care: Hospital Recommended Level of Care: Assisted Living Facility Prior Approval Number:    Date Approved/Denied:   PASRR Number:    Discharge Plan: Other (Comment) (ALF [facility])    Current Diagnoses: Patient Active Problem List   Diagnosis Date Noted   Cellulitis of left upper extremity 05/28/2024   Pressure injury of skin 05/28/2024   Cellulitis 05/27/2024   CAP (community acquired pneumonia) 03/19/2024   Acute on chronic hypoxic respiratory failure (HCC) 03/19/2024   UTI (urinary tract infection) 03/19/2024   Demand ischemia (HCC) 03/19/2024   Acute metabolic encephalopathy 03/19/2024   Hyperlipidemia 03/19/2024   Chronic systolic CHF (congestive heart failure) (HCC) 03/19/2024   Change in mental status 03/18/2024   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   Acute CVA (cerebrovascular accident) (HCC) 12/07/2023   Closed right humeral fracture 12/07/2023   Hypokalemia 12/07/2023   Tobacco abuse 12/07/2023   Leukocytosis 12/07/2023   Recurrent falls 12/07/2023   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   S/P reverse total shoulder arthroplasty, 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   Port-A-Cath in place 05/25/2019   Small cell lung cancer, 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   Goals of care, counseling/discussion 03/10/2019   Encounter for antineoplastic chemotherapy 03/10/2019   Encounter for antineoplastic immunotherapy 03/10/2019   Pathologic fracture of right humerus 02/25/2019   Lymphadenopathy,  abdominal 01/02/2019   Lymphadenopathy, mediastinal 01/02/2019   Pathological fracture, left humerus, initial encounter for fracture 12/26/2018   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    Orientation RESPIRATION BLADDER Height & Weight     Self, Place  O2 Incontinent Weight: 53 kg Height:  5' 3 (160 cm)  BEHAVIORAL SYMPTOMS/MOOD NEUROLOGICAL BOWEL NUTRITION STATUS     (n/a) Continent Diet (Regular diet)  AMBULATORY STATUS COMMUNICATION OF NEEDS Skin   Total Care (wheelchair bound) Verbally PU Stage and Appropriate Care (stage II sacrum foam dressing redness noted to left arm)                       Personal Care Assistance Level of Assistance  Bathing, Feeding, Dressing Bathing Assistance: Limited assistance Feeding assistance: Limited assistance Dressing Assistance: Limited assistance     Functional Limitations Info  Sight, Hearing, Speech Sight Info: Impaired Hearing Info: Adequate Speech Info: Adequate    SPECIAL CARE FACTORS FREQUENCY  PT (By licensed PT)     PT Frequency: HH PT ay ALF per therapy recommendation              Contractures Contractures Info: Not present    Additional Factors Info  Code Status, Allergies, Psychotropic, Insulin  Sliding Scale, Isolation Precautions, Suctioning Needs Code Status Info: DNR Allergies Info: Tape, Oxycodone  Psychotropic Info: see discharge summary Insulin  Sliding Scale Info: see discharge summary Isolation Precautions Info: n/a Suctioning Needs: n/a   Current Medications (05/29/2024):  This is the current hospital active medication list Current Facility-Administered Medications  Medication Dose Route Frequency Provider Last Rate Last Admin   acetaminophen  (TYLENOL ) tablet 650 mg  650 mg Oral Q6H PRN Zella, Mir M, MD       Or   acetaminophen  (TYLENOL ) suppository 650 mg  650 mg Rectal Q6H PRN Zella, Mir M, MD       albuterol  (PROVENTIL ) (2.5 MG/3ML) 0.083%  nebulizer solution 3 mL  3 mL Inhalation Q6H PRN Pahwani, Ravi, MD       aspirin  EC tablet 81 mg  81 mg Oral Daily Ikramullah, Mir M, MD   81 mg at 05/29/24 1014   atorvastatin  (LIPITOR) tablet 40 mg  40 mg Oral Daily Ikramullah, Mir M, MD   40 mg at 05/29/24 1014   ceFAZolin  (ANCEF ) IVPB 2g/100 mL premix  2 g Intravenous Q8H Zella, Mir M, MD 200 mL/hr at 05/29/24 1318 2 g at 05/29/24 1318   Chlorhexidine  Gluconate Cloth 2 % PADS 6 each  6 each Topical Daily [place] Ranks, MD   6 each at 05/29/24 1017   enoxaparin  (LOVENOX ) injection 40 mg  40 mg Subcutaneous Q24H Zella, Mir M, MD   40 mg at 05/29/24 1014   feeding supplement (ENSURE PLUS HIGH PROTEIN) liquid 237 mL  237 mL Oral BID BM Zella, Mir M, MD   237 mL at 05/28/24 9062   gabapentin  (NEURONTIN ) capsule 100 mg  100 mg Oral BID Zella, Mir M, MD   100 mg at 05/29/24 1014   nystatin  (MYCOSTATIN /NYSTOP ) topical powder   Topical TID Zella Katha HERO, MD   Given at 05/29/24 1015   senna-docusate (Senokot-S) tablet 1 tablet  1 tablet Oral QHS PRN Pahwani, Ravi, MD       sodium chloride  flush (NS) 0.9 % injection 10-40 mL  10-40 mL Intracatheter Q12H Pahwani, Ranks, MD   10 mL at 05/29/24 1016   sodium chloride  flush (NS) 0.9 % injection 10-40 mL  10-40 mL Intracatheter PRN [place] Ranks, MD   10 mL at 05/28/24 9060   traZODone  (DESYREL ) tablet 25 mg  25 mg Oral QHS PRN Zella Katha HERO, MD       Facility-Administered Medications Ordered in Other Encounters  Medication Dose Route Frequency Provider Last Rate Last Admin   heparin  lock flush 100 unit/mL  500 Units Intracatheter Once PRN Magrinat, Sandria BROCKS, MD       sodium chloride  flush (NS) 0.9 % injection 10 mL  10 mL Intracatheter PRN Magrinat, Sandria BROCKS, MD         Discharge Medications: acetaminophen  325 MG tablet Commonly known as: TYLENOL  Take 2 tablets (650 mg total) by mouth every 4 (four) hours as needed for mild pain (pain score 1-3) (or temp > 37.5 C (99.5 F)).     albuterol  108 (90 Base) MCG/ACT inhaler Commonly known as: VENTOLIN  HFA Inhale 2 puffs into the lungs every 6 (six) hours as needed for wheezing or shortness of breath.    aspirin  EC 81 MG tablet Take 1 tablet (81 mg total) by mouth daily. Swallow whole.    atorvastatin  40 MG tablet Commonly known as: LIPITOR Take 1 tablet (40 mg total) by mouth daily.    b complex vitamins tablet Take 1 tablet by mouth daily.    cephALEXin  500 MG capsule Commonly known as: KEFLEX  Take 1 capsule (500 mg total) by mouth 3 (three) times daily for 7 days.    durvalumab  1,500 mg in sodium chloride  0.9 % 100 mL Inject 1,500 mg into the vein every 28 (twenty-eight) days.    gabapentin  100 MG capsule Commonly known as: NEURONTIN  Take 1 capsule (100  mg total) by mouth 2 (two) times daily. What changed:  when to take this additional instructions    ibuprofen 600 MG tablet Commonly known as: ADVIL Take 600 mg by mouth 2 (two) times daily as needed.    multivitamin tablet Take 1 tablet by mouth daily with breakfast.    nicotine  14 mg/24hr patch Commonly known as: NICODERM CQ  - dosed in mg/24 hours Place 14 mg onto the skin daily as needed (for the urge to smoke while hospitalized).    nystatin  powder Commonly known as: MYCOSTATIN /NYSTOP  Apply topically 3 (three) times daily.    ondansetron  4 MG tablet Commonly known as: Zofran  Take 1 tablet (4 mg total) by mouth every 8 (eight) hours as needed for nausea or vomiting.    oxymetazoline 0.05 % nasal spray Commonly known as: AFRIN Place 2 sprays into both nostrils 2 (two) times daily as needed (for a bloody nose).    senna-docusate 8.6-50 MG tablet Commonly known as: Senokot-S Take 1 tablet by mouth at bedtime as needed for mild constipation.    VITAMIN C PO Take 1 tablet by mouth daily.    Relevant Imaging Results:  Relevant Lab Results:   Additional Information SS# [id number]  Toy LITTIE Agar, RN

## 2024-05-30 DIAGNOSIS — B369 Superficial mycosis, unspecified: Secondary | ICD-10-CM

## 2024-05-30 DIAGNOSIS — L03114 Cellulitis of left upper limb: Secondary | ICD-10-CM | POA: Diagnosis not present

## 2024-05-30 LAB — CULTURE, BLOOD (ROUTINE X 2): Special Requests: ADEQUATE

## 2024-05-30 MED ORDER — HEPARIN SOD (PORK) LOCK FLUSH 100 UNIT/ML IV SOLN
INTRAVENOUS | Status: AC
  Filled 2024-05-30: qty 5

## 2024-05-30 MED ORDER — HEPARIN SOD (PORK) LOCK FLUSH 100 UNIT/ML IV SOLN
500.0000 [IU] | Freq: Once | INTRAVENOUS | Status: AC
  Administered 2024-05-30: 500 [IU] via INTRAVENOUS

## 2024-05-30 NOTE — Progress Notes (Signed)
 Report called to [place],Nursing.

## 2024-05-30 NOTE — TOC Progression Note (Signed)
 Transition of Care [facility]) - Progression Note    Patient Details  Name: Brittany Marks MRN: [id number] Date of Birth: 07-02-1941  [facility] CM/SW Contact  Heather DELENA Saltness, LCSW Phone Number: 05/30/2024, 11:12 AM  Clinical Narrative:     11:14 AM - CSW spoke with admissions staff at [facility], who reports pt is able to return today.   11:06 AM - CSW spoke with pt's daughter, [name] [phone], via phone call to discuss discharge plan. Daughter reports she is pt's POA, paperwork listed in chart. Pt's daughter reports she would like pt to return to [facility].      Barriers to Discharge: Continued Medical Work up   Expected Discharge Plan and Services  [facility]       Expected Discharge Date: 05/29/24                  Social Drivers of Health (SDOH) Interventions SDOH Screenings   Food Insecurity: Patient Declined (05/28/2024)  Housing: Unknown (05/28/2024)  Transportation Needs: Patient Declined (05/28/2024)  Utilities: Patient Declined (05/28/2024)  Depression (PHQ2-9): Low Risk  (05/19/2024)  Social Connections: Unknown (05/28/2024)  Tobacco Use: High Risk (05/27/2024)    Readmission Risk Interventions    03/26/2024   11:55 AM 12/08/2023   12:37 PM  Readmission Risk Prevention Plan  Post Dischage Appt  Complete  Medication Screening  Complete  Transportation Screening Complete Complete  PCP or Specialist Appt within 5-7 Days Complete   Home Care Screening Complete   Medication Review (RN CM) Complete     Signed: Heather Saltness, MSW, LCSW Clinical Social Worker Inpatient Care Management 05/30/2024 11:17 AM

## 2024-05-30 NOTE — Discharge Summary (Signed)
 Physician Discharge Summary  Brittany Marks:[id number] DOB: [birth year] DOA: 05/27/2024  PCP: Pcp, No  Admit date: 05/27/2024 Discharge date: 05/30/2024 30 Day Unplanned Readmission Risk Score    Flowsheet Row ED to Hosp-Admission (Current) from 05/27/2024 in [facility]  30 Day Unplanned Readmission Risk Score (%) 15.37 Filed at 05/29/2024 0801    This score is the patient's risk of an unplanned readmission within 30 days of being discharged (0 -100%). The score is based on dignosis, age, lab data, medications, orders, and past utilization.   Low:  0-14.9   Medium: 15-21.9   High: 22-29.9   Extreme: 30 and above          Admitted From: Home Disposition: Home  Recommendations for Outpatient Follow-up:  Follow up with PCP in 1-2 weeks Please obtain BMP/CBC in one week Please follow up with your PCP on the following pending results: Unresulted Labs (From admission, onward)    None         Home Health: Yes Equipment/Devices: None  Discharge Condition:  stable CODE STATUS: Full code Diet recommendation:  Diet Order             Diet regular Room service appropriate? Yes; Fluid consistency: Thin  Diet effective now                 HPI: Brittany Marks is a 83 y.o. 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  Patient apparently lived on her own until relatively recently, currently lives at a facility and staff noted that she had an odor and area of erythema in the left armpit going down towards her elbow.  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  Later, when I tell her that she has an infection on her arm and needs to be admitted to the hospital, she tells me she did not know anything about that and does not know why she is here in the hospital.  In any case, she denies any chest pain, shortness of breath, nausea, vomiting or any other concerns.  Workup in the emergency department including CT scan and left upper extremity Doppler study has no acute findings, but on examination there is certainly concern for an uncomplicated cellulitis.  She was given a dose of IV Rocephin  and hospitalist admission was requested.   Subjective: Patient seen and examined, she has no complaint at all.  Her cellulitis in the left armpit also looks much improved.  Dr Vernon discussed plan of discharge with pt.  She says she does not want to go back to the facility.discussed with TOC. Pt is LTC at facility recommendation to look for new facility while at [facility]  Brief/Interim Summary: Details of the hospitalization as below.  Cellulitis left upper extremity/armpit-without evidence of sepsis, or other complication such as abscess.  Cellulitis is mainly involving your left armpit.  There appeared to be some fungal superinfection as well.  She was treated with Ancef  as well as nystatin  powder.  This has improved significantly to the point that her antibiotics  can be switched to oral and she can be discharged home.  Sending her home on Keflex  for 7 days along with nystatin  powder.   COPD-no evidence of exacerbation, continue bronchodilators.  She has chronic hypoxic respiratory failure, she is oxygen  dependent chronically.   Chronic combined systolic and diastolic congestive heart failure-last echo done in March 2025 shows 40 to 45% ejection fraction with grade 2 diastolic dysfunction.  Appears euvolemic clinically.  Resume all PTA medications.   Chemotherapy-induced neuropathy-gabapentin    Metastatic small cell lung  cancer-on palliative chemo, Dr. Sherrod added to inpatient treatment team, no oncologic need for inpatient consult   Hypokalemia: Resolved   Hyperlipidemia-Lipitor  Discharge Diagnoses:  Principal Problem:   Cellulitis Active Problems:   Cellulitis of left upper extremity   Pressure injury of skin    Discharge Instructions   Allergies as of 05/30/2024       Reactions   Tape Other (See Comments)   SKIN IS DELICATE!!   Oxycodone  Other (See Comments)   Unsteady gait, fall, altered mental status        Medication List     TAKE these medications    acetaminophen  325 MG tablet Commonly known as: TYLENOL  Take 2 tablets (650 mg total) by mouth every 4 (four) hours as needed for mild pain (pain score 1-3) (or temp > 37.5 C (99.5 F)).   albuterol  108 (90 Base) MCG/ACT inhaler Commonly known as: VENTOLIN  HFA Inhale 2 puffs into the lungs every 6 (six) hours as needed for wheezing or shortness of breath.   ascorbic acid 500 MG tablet Commonly known as: VITAMIN C Take 500 mg by mouth daily.   aspirin  EC 81 MG tablet Take 1 tablet (81 mg total) by mouth daily. Swallow whole.   atorvastatin  40 MG tablet Commonly known as: LIPITOR Take 1 tablet (40 mg total) by mouth daily. What changed: when to take this   b complex vitamins tablet Take 1 tablet by mouth daily with breakfast.   cephALEXin  500 MG capsule Commonly known as: KEFLEX  Take 1 capsule (500 mg total) by mouth 3 (three) times daily for 7 days.   durvalumab  1,500 mg in sodium chloride  0.9 % 100 mL Inject 1,500 mg into the vein every 28 (twenty-eight) days.   gabapentin  100 MG capsule Commonly known as: NEURONTIN  Take 1 capsule (100 mg total) by mouth 2 (two) times daily. What changed:  when to take this additional instructions   ibuprofen 600 MG tablet Commonly known as: ADVIL Take 600 mg by mouth every 12 (twelve) hours as needed (for pain or headaches).   multivitamin tablet Take 1 tablet by mouth  daily with breakfast.   nicotine  14 mg/24hr patch Commonly known as: NICODERM CQ  - dosed in mg/24 hours Place 14 mg onto the skin daily.   nystatin  powder Commonly known as: MYCOSTATIN /NYSTOP  Apply topically 3 (three) times daily.   ondansetron  4 MG tablet Commonly known as: Zofran  Take 1 tablet (4 mg total) by mouth every 8 (eight) hours as needed for nausea or vomiting.   oxymetazoline 0.05 % nasal spray Commonly known as: AFRIN Place 2 sprays into both nostrils 2 (two) times daily.   senna-docusate 8.6-50 MG tablet Commonly known as: Senokot-S Take 1 tablet by mouth at bedtime as needed for mild constipation. What changed: when to take this         Follow-up Information     PCP Follow up in 1 week(s).          Care,  [facility] Follow up.   Specialty: Home Health Services Why: This provider will complete initial assessment within 24-48 hours after discharge to begin home health PT/OT services. Contact information: [address] [place] [address] [phone]                Allergies  Allergen Reactions   Tape Other (See Comments)    SKIN IS DELICATE!!   Oxycodone  Other (See Comments)    Unsteady gait, fall, altered mental status    Consultations: None   Procedures/Studies: UE VENOUS DUPLEX (7am - 7pm) Result Date: 05/27/2024 UPPER VENOUS STUDY  Patient Name:  Brittany Marks  Date of Exam:   05/27/2024 Medical Rec #: [id number]          Accession #:    [id number] Date of Birth: 01/30/1941          Patient Gender: F Patient Age:   83 years Exam Location:  [facility] Procedure:      VAS US  UPPER EXTREMITY VENOUS DUPLEX Referring Phys: BURGESS NANAVATI --------------------------------------------------------------------------------  Indications: Edema, and redness, cancer hx Limitations: Painful axilla area with purulent drainage. Performing Technologist: Elmarie Lindau, RVT  Examination Guidelines: A complete evaluation  includes B-mode imaging, spectral Doppler, color Doppler, and power Doppler as needed of all accessible portions of each vessel. Bilateral testing is considered an integral part of a complete examination. Limited examinations for reoccurring indications may be performed as noted.  Left Findings: +----------+------------+---------+-----------+----------+--------------+ LEFT      CompressiblePhasicitySpontaneousProperties   Summary     +----------+------------+---------+-----------+----------+--------------+ IJV           Full                                                 +----------+------------+---------+-----------+----------+--------------+ Subclavian               Yes                                       +----------+------------+---------+-----------+----------+--------------+ Axillary                                            Not visualized +----------+------------+---------+-----------+----------+--------------+ Brachial      Full                                                 +----------+------------+---------+-----------+----------+--------------+ Radial        Full                                                 +----------+------------+---------+-----------+----------+--------------+ Ulnar         Full                                                 +----------+------------+---------+-----------+----------+--------------+  Cephalic      Full                                                 +----------+------------+---------+-----------+----------+--------------+ Basilic       Full                                                 +----------+------------+---------+-----------+----------+--------------+  Summary:  Left: Limited exam, however there is no evidence of left upper extremity DVT/SVT in areas visualized.  *See table(s) above for measurements and observations.  Diagnosing physician: Gaile New MD Electronically signed by Gaile New MD on 05/27/2024 at 10:46:08 PM.    Final    CT Chest W Contrast Result Date: 05/27/2024 CLINICAL DATA:  Left axilla swelling and pain. History of lung cancer. EXAM: CT CHEST WITH CONTRAST TECHNIQUE: Multidetector CT imaging of the chest was performed during intravenous contrast administration. RADIATION DOSE REDUCTION: This exam was performed according to the departmental dose-optimization program which includes automated exposure control, adjustment of the mA and/or kV according to patient size and/or use of iterative reconstruction technique. CONTRAST:  75mL OMNIPAQUE  IOHEXOL  300 MG/ML  SOLN COMPARISON:  CT chest 03/18/2024 FINDINGS: Cardiovascular: No significant vascular findings. Normal heart size. No pericardial effusion. Right chest port catheter tip ends in the right atrium. Mediastinum/Nodes: There are enlarged left hilar lymph nodes measuring up to 14 mm. Left hilar lymphadenopathy has slightly decreased in the interval. No new enlarged lymph nodes are identified. Visualized esophagus and thyroid  gland are within normal limits.There is no axillary lymphadenopathy Lungs/Pleura: Peripheral interstitial opacities are again seen scattered throughout both lungs. There are fibrotic changes in both lung bases. Bilateral lower lobe airspace disease has significantly decreased/resolved. Focal cavitary airspace opacity in the left upper lobe has decreased in size, but cavitation has increased. This now measures 2.7 By 4.6 Cm (previously 3.7 by 3.0 Cm). No new focal lung infiltrate identified. No pleural effusion or pneumothorax. Trachea and central airways are patent. Upper Abdomen: Prominent right extrarenal pelvis is partially imaged and grossly unchanged. Musculoskeletal: Degenerative changes affect the spine. Left shoulder arthroplasty is present. No acute fractures are seen. IMPRESSION: 1. Left upper lobe cavitary lesion has decreased in size, but cavitation has increased. 2. Left hilar  lymphadenopathy has slightly decreased in the interval. 3. Bilateral lower lobe airspace disease has significantly decreased/resolved. 4. 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 5. No new focal lung infiltrate. 6. No acute fractures are seen. Electronically Signed   By: Greig Pique M.D.   On: 05/27/2024 18:37   DG Chest Port 1 View Result Date: 05/27/2024 CLINICAL DATA:  Questionable sepsis EXAM: PORTABLE CHEST 1 VIEW COMPARISON:  Chest x-ray 03/25/2024.  CT of the chest 03/18/2024. FINDINGS: Right chest port catheter tip ends in the distal SVC. The heart is enlarged. There are atherosclerotic calcifications of the aorta. Prominence of the upper right paratracheal region corresponds to ectatic vasculature. There are new patchy multifocal airspace opacities throughout both lungs, left greater than right. There is no pleural effusion or pneumothorax. The osseous structures are stable no acute fractures are seen. IMPRESSION: 1. New patchy multifocal airspace opacities throughout both lungs, left greater than right, concerning for multifocal  pneumonia. 2. Cardiomegaly. Electronically Signed   By: Greig Pique M.D.   On: 05/27/2024 15:36     Discharge Exam: Vitals:   05/30/24 0229 05/30/24 0429  BP: (!) 109/55 (!) 122/43  Pulse: 82 80  Resp: 15 18  Temp: 98 F (36.7 C) 98 F (36.7 C)  SpO2: 97% 96%   Vitals:   05/29/24 1343 05/29/24 2000 05/30/24 0229 05/30/24 0429  BP: (!) 127/96 (!) 103/54 (!) 109/55 (!) 122/43  Pulse: 93 94 82 80  Resp: 15 18 15 18   Temp: 98.3 F (36.8 C) 99.2 F (37.3 C) 98 F (36.7 C) 98 F (36.7 C)  TempSrc: Oral Oral  Oral  SpO2: 93% 95% 97% 96%  Weight:      Height:        General exam: Awake, laying in bed, in nad Respiratory system: Normal respiratory effort, no wheezing Cardiovascular system: regular rate, s1, s2 Gastrointestinal system: Soft, nondistended, positive BS Central nervous system: CN2-12 grossly intact,  strength intact Extremities: Perfused, no clubbing Skin: Normal skin turgor, no notable skin lesions seen Psychiatry: Mood normal // no visual hallucinations   Decision making capacity evaluated. No barriers to communication is present. There are also no reversible causes of incapacity apparent. The patient understands the medical situation and is aware of all available options of the situation. The patient is aware of benefits and risks to treatment as well as lack of treatment. At this time, pt is alert and oriented x 3 and is able to make medical decisions  The results of significant diagnostics from this hospitalization (including imaging, microbiology, ancillary and laboratory) are listed below for reference.     Microbiology: Recent Results (from the past 240 hours)  Blood Culture (routine x 2)     Status: Abnormal   Collection Time: 05/27/24  4:19 PM   Specimen: BLOOD  Result Value Ref Range Status   Specimen Description   Final    BLOOD PORTA CATH Performed at [facility], [address] [place], [place] [address]    Special Requests   Final    BOTTLES DRAWN AEROBIC AND ANAEROBIC Blood Culture adequate volume Performed at [facility], [address], [place], [place] [address]    Culture  Setup Time   Final    GRAM POSITIVE COCCI IN CLUSTERS AEROBIC BOTTLE ONLY CRITICAL RESULT CALLED TO, READ BACK BY AND VERIFIED WITH: PHARMD DREW W. [id number] AT 1730, ADC    Culture (A)  Final    STAPHYLOCOCCUS HOMINIS THE SIGNIFICANCE OF ISOLATING THIS ORGANISM FROM A SINGLE SET OF BLOOD CULTURES WHEN MULTIPLE SETS ARE DRAWN IS UNCERTAIN. PLEASE NOTIFY THE MICROBIOLOGY DEPARTMENT WITHIN ONE WEEK IF SPECIATION AND SENSITIVITIES ARE REQUIRED. Performed at [facility], [address] [place], [place] [address]    Report Status 05/30/2024 FINAL  Final  Blood Culture ID Panel (Reflexed)     Status: Abnormal   Collection Time: 05/27/24  4:19 PM   Result Value Ref Range Status   Enterococcus faecalis NOT DETECTED NOT DETECTED Final   Enterococcus Faecium NOT DETECTED NOT DETECTED Final   Listeria monocytogenes NOT DETECTED NOT DETECTED Final   Staphylococcus species DETECTED (A) NOT DETECTED Final    Comment: CRITICAL RESULT CALLED TO, READ BACK BY AND VERIFIED WITH: PHARMD DREW W. [id number] AT 1730, ADC    Staphylococcus aureus (BCID) NOT DETECTED NOT DETECTED Final   Staphylococcus epidermidis NOT DETECTED NOT DETECTED Final   Staphylococcus lugdunensis NOT  DETECTED NOT DETECTED Final   Streptococcus species NOT DETECTED NOT DETECTED Final   Streptococcus agalactiae NOT DETECTED NOT DETECTED Final   Streptococcus pneumoniae NOT DETECTED NOT DETECTED Final   Streptococcus pyogenes NOT DETECTED NOT DETECTED Final   A.calcoaceticus-baumannii NOT DETECTED NOT DETECTED Final   Bacteroides fragilis NOT DETECTED NOT DETECTED Final   Enterobacterales NOT DETECTED NOT DETECTED Final   Enterobacter cloacae complex NOT DETECTED NOT DETECTED Final   Escherichia coli NOT DETECTED NOT DETECTED Final   Klebsiella aerogenes NOT DETECTED NOT DETECTED Final   Klebsiella oxytoca NOT DETECTED NOT DETECTED Final   Klebsiella pneumoniae NOT DETECTED NOT DETECTED Final   Proteus species NOT DETECTED NOT DETECTED Final   Salmonella species NOT DETECTED NOT DETECTED Final   Serratia marcescens NOT DETECTED NOT DETECTED Final   Haemophilus influenzae NOT DETECTED NOT DETECTED Final   Neisseria meningitidis NOT DETECTED NOT DETECTED Final   Pseudomonas aeruginosa NOT DETECTED NOT DETECTED Final   Stenotrophomonas maltophilia NOT DETECTED NOT DETECTED Final   Candida albicans NOT DETECTED NOT DETECTED Final   Candida auris NOT DETECTED NOT DETECTED Final   Candida glabrata NOT DETECTED NOT DETECTED Final   Candida krusei NOT DETECTED NOT DETECTED Final   Candida parapsilosis NOT DETECTED NOT DETECTED Final   Candida tropicalis NOT DETECTED NOT  DETECTED Final   Cryptococcus neoformans/gattii NOT DETECTED NOT DETECTED Final    Comment: Performed at [facility], [address] [place], [place] [address]  Blood Culture (routine x 2)     Status: None (Preliminary result)   Collection Time: 05/28/24 12:25 AM   Specimen: BLOOD  Result Value Ref Range Status   Specimen Description   Final    BLOOD BLOOD RIGHT HAND Performed at [facility], [address], [place], [place] [address]    Special Requests   Final    Blood Culture results may not be optimal due to an inadequate volume of blood received in culture bottles BOTTLES DRAWN AEROBIC ONLY Performed at [facility], [address] [place], [place] [address]    Culture   Final    NO GROWTH 2 DAYS Performed at [facility], [address], [place], [place] [address]    Report Status PENDING  Incomplete     Labs: BNP (last 3 results) No results for input(s): BNP in the last 8760 hours. Basic Metabolic Panel: Recent Labs  Lab 05/27/24 1548 05/28/24 0625 05/29/24 0527  NA 136 138 138  K 3.7 3.3* 4.0  CL 106 107 109  CO2 21* 22 21*  GLUCOSE 117* 84 88  BUN 23 19 16   CREATININE 0.96 0.87 0.81  CALCIUM  8.7* 8.4* 8.9   Liver Function Tests: Recent Labs  Lab 05/27/24 1548  AST 18  ALT 13  ALKPHOS 74  BILITOT 0.7  PROT 6.8  ALBUMIN 2.6*   No results for input(s): LIPASE, AMYLASE in the last 168 hours. No results for input(s): AMMONIA in the last 168 hours. CBC: Recent Labs  Lab 05/27/24 1548 05/28/24 0625 05/29/24 0527  WBC 15.2* 11.5* 12.0*  NEUTROABS 13.0*  --   --   HGB 10.4* 9.6* 9.7*  HCT 32.3* 29.9* 32.1*  MCV 94.4 95.2 95.8  PLT 404* 381 401*   Cardiac Enzymes: No results for input(s): CKTOTAL, CKMB, CKMBINDEX, TROPONINI in the last 168 hours. BNP: Invalid input(s): POCBNP CBG: No results for input(s): GLUCAP in the last 168 hours. D-Dimer No results for input(s):  DDIMER in the last 72 hours. Hgb A1c No results for input(s): HGBA1C in the last 72 hours. Lipid Profile No results for input(s): CHOL, HDL, LDLCALC, TRIG, CHOLHDL, LDLDIRECT in the last 72 hours. Thyroid  function studies No results for input(s): TSH, T4TOTAL, T3FREE, THYROIDAB in the last 72 hours.  Invalid input(s): FREET3 Anemia work up No results for input(s): VITAMINB12, FOLATE, FERRITIN, TIBC, IRON, RETICCTPCT in the last 72 hours. Urinalysis    Component Value Date/Time   COLORURINE YELLOW 03/19/2024 0224   APPEARANCEUR HAZY (A) 03/19/2024 0224   LABSPEC 1.038 (H) 03/19/2024 0224   PHURINE 6.0 03/19/2024 0224   GLUCOSEU NEGATIVE 03/19/2024 0224   HGBUR NEGATIVE 03/19/2024 0224   BILIRUBINUR NEGATIVE 03/19/2024 0224   KETONESUR 5 (A) 03/19/2024 0224   PROTEINUR 30 (A) 03/19/2024 0224   UROBILINOGEN 0.2 07/23/2013 1332   NITRITE NEGATIVE 03/19/2024 0224   LEUKOCYTESUR TRACE (A) 03/19/2024 0224   Sepsis Labs Recent Labs  Lab 05/27/24 1548 05/28/24 0625 05/29/24 0527  WBC 15.2* 11.5* 12.0*   Microbiology Recent Results (from the past 240 hours)  Blood Culture (routine x 2)     Status: Abnormal   Collection Time: 05/27/24  4:19 PM   Specimen: BLOOD  Result Value Ref Range Status   Specimen Description   Final    BLOOD PORTA CATH Performed at [facility], [address] [place], [place] [address]    Special Requests   Final    BOTTLES DRAWN AEROBIC AND ANAEROBIC Blood Culture adequate volume Performed at [facility], [address], [place], [place] [address]    Culture  Setup Time   Final    GRAM POSITIVE COCCI IN CLUSTERS AEROBIC BOTTLE ONLY CRITICAL RESULT CALLED TO, READ BACK BY AND VERIFIED WITH: PHARMD DREW W. [id number] AT 1730, ADC    Culture (A)  Final    STAPHYLOCOCCUS HOMINIS THE SIGNIFICANCE OF ISOLATING THIS ORGANISM FROM A SINGLE SET OF BLOOD CULTURES WHEN MULTIPLE SETS  ARE DRAWN IS UNCERTAIN. PLEASE NOTIFY THE MICROBIOLOGY DEPARTMENT WITHIN ONE WEEK IF SPECIATION AND SENSITIVITIES ARE REQUIRED. Performed at [facility], [address] [place], [place] [address]    Report Status 05/30/2024 FINAL  Final  Blood Culture ID Panel (Reflexed)     Status: Abnormal   Collection Time: 05/27/24  4:19 PM  Result Value Ref Range Status   Enterococcus faecalis NOT DETECTED NOT DETECTED Final   Enterococcus Faecium NOT DETECTED NOT DETECTED Final   Listeria monocytogenes NOT DETECTED NOT DETECTED Final   Staphylococcus species DETECTED (A) NOT DETECTED Final    Comment: CRITICAL RESULT CALLED TO, READ BACK BY AND VERIFIED WITH: PHARMD DREW W. [id number] AT 1730, ADC    Staphylococcus aureus (BCID) NOT DETECTED NOT DETECTED Final   Staphylococcus epidermidis NOT DETECTED NOT DETECTED Final   Staphylococcus lugdunensis NOT DETECTED NOT DETECTED Final   Streptococcus species NOT DETECTED NOT DETECTED Final   Streptococcus agalactiae NOT DETECTED NOT DETECTED Final   Streptococcus pneumoniae NOT DETECTED NOT DETECTED Final   Streptococcus pyogenes NOT DETECTED NOT DETECTED Final   A.calcoaceticus-baumannii NOT DETECTED NOT DETECTED Final   Bacteroides fragilis NOT DETECTED NOT DETECTED Final   Enterobacterales NOT DETECTED NOT DETECTED Final   Enterobacter cloacae complex NOT DETECTED NOT DETECTED Final   Escherichia coli NOT DETECTED NOT DETECTED Final   Klebsiella aerogenes NOT DETECTED NOT DETECTED Final   Klebsiella oxytoca NOT DETECTED NOT DETECTED Final   Klebsiella pneumoniae NOT DETECTED NOT DETECTED  Final   Proteus species NOT DETECTED NOT DETECTED Final   Salmonella species NOT DETECTED NOT DETECTED Final   Serratia marcescens NOT DETECTED NOT DETECTED Final   Haemophilus influenzae NOT DETECTED NOT DETECTED Final   Neisseria meningitidis NOT DETECTED NOT DETECTED Final   Pseudomonas aeruginosa NOT DETECTED NOT DETECTED Final   Stenotrophomonas  maltophilia NOT DETECTED NOT DETECTED Final   Candida albicans NOT DETECTED NOT DETECTED Final   Candida auris NOT DETECTED NOT DETECTED Final   Candida glabrata NOT DETECTED NOT DETECTED Final   Candida krusei NOT DETECTED NOT DETECTED Final   Candida parapsilosis NOT DETECTED NOT DETECTED Final   Candida tropicalis NOT DETECTED NOT DETECTED Final   Cryptococcus neoformans/gattii NOT DETECTED NOT DETECTED Final    Comment: Performed at [facility], [address] [place], [place] [address]  Blood Culture (routine x 2)     Status: None (Preliminary result)   Collection Time: 05/28/24 12:25 AM   Specimen: BLOOD  Result Value Ref Range Status   Specimen Description   Final    BLOOD BLOOD RIGHT HAND Performed at [facility], [address], [place], [place] [address]    Special Requests   Final    Blood Culture results may not be optimal due to an inadequate volume of blood received in culture bottles BOTTLES DRAWN AEROBIC ONLY Performed at [facility], [address], [place], [place] [address]    Culture   Final    NO GROWTH 2 DAYS Performed at [facility], [address] [place], [place] [address]    Report Status PENDING  Incomplete    FURTHER DISCHARGE INSTRUCTIONS:   Get Medicines reviewed and adjusted: Please take all your medications with you for your next visit with your Primary MD   Laboratory/radiological data: Please request your Primary MD to go over all hospital tests and procedure/radiological results at the follow up, please ask your Primary MD to get all Hospital records sent to his/her office.   In some cases, they will be blood work, cultures and biopsy results pending at the time of your discharge. Please request that your primary care M.D. goes through all the records of your hospital data and follows up on these results.   Also Note the following: If you experience worsening of your admission symptoms,  develop shortness of breath, life threatening emergency, suicidal or homicidal thoughts you must seek medical attention immediately by calling 911 or calling your MD immediately  if symptoms less severe.   You must read complete instructions/literature along with all the possible adverse reactions/side effects for all the Medicines you take and that have been prescribed to you. Take any new Medicines after you have completely understood and accpet all the possible adverse reactions/side effects.    patient was instructed, not to drive, operate heavy machinery, perform activities at heights, swimming or participation in water  activities or provide baby-sitting services while on Pain, Sleep and Anxiety Medications; until their outpatient Physician has advised to do so again. Also recommended to not to take more than prescribed Pain, Sleep and Anxiety Medications.  It is not advisable to combine anxiety, sleep and pain medications without talking with your primary care provider.     Wear Seat belts while driving.   Please note: You were cared for by a hospitalist during your hospital stay. Once you are discharged, your primary care physician will handle any further medical issues. Please note that NO REFILLS  for any discharge medications will be authorized once you are discharged, as it is imperative that you return to your primary care physician (or establish a relationship with a primary care physician if you do not have one) for your post hospital discharge needs so that they can reassess your need for medications and monitor your lab values  Time coordinating discharge: Over 30 minutes  SIGNED:   Garnette Pelt, MD  [facility] 05/30/2024, 12:59 PM *Please note that this is a verbal dictation therefore any spelling or grammatical errors are due to the Dragon Medical One system interpretation. If 7PM-7AM, please contact night-coverage [email or web address]

## 2024-05-30 NOTE — TOC Transition Note (Signed)
 Transition of Care [facility]) - Discharge Note   Patient Details  Name: Brittany Marks DOBIES MRN: [id number] Date of Birth: Sep 18, 1941  Transition of [facility]) CM/SW Contact:  Heather DELENA Saltness, LCSW Phone Number: 05/30/2024, 11:22 AM   Clinical Narrative:    Pt to discharge back to [facility] today. Pt to receive [facility] PT/OT services through [place]. D/C packet placed in pt's chart at RN station. RN to call report to [phone]. PTAR called at 3:30 PM. Pt's daughter/POA, [name] [phone], made aware and in agreement with discharge plan. No further TOC needs at this time.   Final next level of care: Assisted Living Barriers to Discharge: Barriers Resolved   Patient Goals and CMS Choice Patient states their goals for this hospitalization and ongoing recovery are:: To return to [facility]    Discharge Placement  Fredick Lesch              Patient to be transferred to facility by: PTAR Name of family member notified: [name] Patient and family notified of of transfer: 05/30/24  Discharge Plan and Services Additional resources added to the After Visit Summary for  [facility] services                DME Arranged: N/A DME Agency: NA       HH Arranged: PT, OT HH Agency: [facility] Date [facility] Agency Contacted: 05/30/24 Time HH Agency Contacted: 1122 Representative spoke with at [facility] Agency: Cindie  Social Drivers of Health (SDOH) Interventions SDOH Screenings   Food Insecurity: Patient Declined (05/28/2024)  Housing: Unknown (05/28/2024)  Transportation Needs: Patient Declined (05/28/2024)  Utilities: Patient Declined (05/28/2024)  Depression (PHQ2-9): Low Risk  (05/19/2024)  Social Connections: Unknown (05/28/2024)  Tobacco Use: High Risk (05/27/2024)     Readmission Risk Interventions    03/26/2024   11:55 AM 12/08/2023   12:37 PM  Readmission Risk Prevention Plan  Post Dischage Appt  Complete  Medication Screening  Complete  Transportation  Screening Complete Complete  PCP or Specialist Appt within 5-7 Days Complete   Home Care Screening Complete   Medication Review (RN CM) Complete     Signed: Heather Saltness, MSW, LCSW Clinical Social Worker Inpatient Care Management 05/30/2024 3:36 PM

## 2024-06-02 LAB — CULTURE, BLOOD (ROUTINE X 2): Culture: NO GROWTH

## 2024-06-03 ENCOUNTER — Telehealth: Payer: Self-pay | Admitting: Medical Oncology

## 2024-06-03 NOTE — Telephone Encounter (Signed)
 Brittany Marks , OT , requests verbal orders for OT to provide evaluation and treatment. LVM ok for OT to see pt.

## 2024-06-16 ENCOUNTER — Encounter: Payer: Self-pay | Admitting: Internal Medicine

## 2024-06-16 ENCOUNTER — Inpatient Hospital Stay: Attending: Internal Medicine

## 2024-06-16 ENCOUNTER — Inpatient Hospital Stay (HOSPITAL_BASED_OUTPATIENT_CLINIC_OR_DEPARTMENT_OTHER): Admitting: Internal Medicine

## 2024-06-16 ENCOUNTER — Inpatient Hospital Stay: Admitting: Dietician

## 2024-06-16 ENCOUNTER — Inpatient Hospital Stay

## 2024-06-16 VITALS — BP 128/98 | HR 96 | Temp 97.0°F | Resp 17

## 2024-06-16 DIAGNOSIS — Z9981 Dependence on supplemental oxygen: Secondary | ICD-10-CM | POA: Insufficient documentation

## 2024-06-16 DIAGNOSIS — Z79899 Other long term (current) drug therapy: Secondary | ICD-10-CM | POA: Diagnosis not present

## 2024-06-16 DIAGNOSIS — Z8673 Personal history of transient ischemic attack (TIA), and cerebral infarction without residual deficits: Secondary | ICD-10-CM | POA: Diagnosis not present

## 2024-06-16 DIAGNOSIS — Z5112 Encounter for antineoplastic immunotherapy: Secondary | ICD-10-CM | POA: Insufficient documentation

## 2024-06-16 DIAGNOSIS — C778 Secondary and unspecified malignant neoplasm of lymph nodes of multiple regions: Secondary | ICD-10-CM | POA: Diagnosis not present

## 2024-06-16 DIAGNOSIS — I517 Cardiomegaly: Secondary | ICD-10-CM | POA: Insufficient documentation

## 2024-06-16 DIAGNOSIS — Z7982 Long term (current) use of aspirin: Secondary | ICD-10-CM | POA: Insufficient documentation

## 2024-06-16 DIAGNOSIS — C7951 Secondary malignant neoplasm of bone: Secondary | ICD-10-CM | POA: Insufficient documentation

## 2024-06-16 DIAGNOSIS — L03114 Cellulitis of left upper limb: Secondary | ICD-10-CM | POA: Insufficient documentation

## 2024-06-16 DIAGNOSIS — Z9221 Personal history of antineoplastic chemotherapy: Secondary | ICD-10-CM | POA: Insufficient documentation

## 2024-06-16 DIAGNOSIS — C3411 Malignant neoplasm of upper lobe, right bronchus or lung: Secondary | ICD-10-CM

## 2024-06-16 DIAGNOSIS — K5900 Constipation, unspecified: Secondary | ICD-10-CM | POA: Diagnosis not present

## 2024-06-16 DIAGNOSIS — I700 Atherosclerosis of aorta: Secondary | ICD-10-CM | POA: Diagnosis not present

## 2024-06-16 LAB — CMP (CANCER CENTER ONLY)
ALT: 61 U/L — ABNORMAL HIGH (ref 0–44)
AST: 99 U/L — ABNORMAL HIGH (ref 15–41)
Albumin: 3.4 g/dL — ABNORMAL LOW (ref 3.5–5.0)
Alkaline Phosphatase: 183 U/L — ABNORMAL HIGH (ref 38–126)
Anion gap: 7 (ref 5–15)
BUN: 14 mg/dL (ref 8–23)
CO2: 27 mmol/L (ref 22–32)
Calcium: 9.4 mg/dL (ref 8.9–10.3)
Chloride: 104 mmol/L (ref 98–111)
Creatinine: 0.65 mg/dL (ref 0.44–1.00)
GFR, Estimated: >60 mL/min (ref >60)
Glucose, Bld: 114 mg/dL — ABNORMAL HIGH (ref 70–99)
Potassium: 3.5 mmol/L (ref 3.5–5.1)
Sodium: 138 mmol/L (ref 135–145)
Total Bilirubin: 0.6 mg/dL (ref 0.0–1.2)
Total Protein: 7 g/dL (ref 6.5–8.1)

## 2024-06-16 LAB — CBC WITH DIFFERENTIAL (CANCER CENTER ONLY)
Abs Immature Granulocytes: 0.06 K/uL (ref 0.00–0.07)
Basophils Absolute: 0.1 K/uL (ref 0.0–0.1)
Basophils Relative: 1 %
Eosinophils Absolute: 0.5 K/uL (ref 0.0–0.5)
Eosinophils Relative: 3 %
HCT: 33 % — ABNORMAL LOW (ref 36.0–46.0)
Hemoglobin: 10.8 g/dL — ABNORMAL LOW (ref 12.0–15.0)
Immature Granulocytes: 0 %
Lymphocytes Relative: 9 %
Lymphs Abs: 1.6 K/uL (ref 0.7–4.0)
MCH: 30.1 pg (ref 26.0–34.0)
MCHC: 32.7 g/dL (ref 30.0–36.0)
MCV: 91.9 fL (ref 80.0–100.0)
Monocytes Absolute: 1.6 K/uL — ABNORMAL HIGH (ref 0.1–1.0)
Monocytes Relative: 9 %
Neutro Abs: 13.2 K/uL — ABNORMAL HIGH (ref 1.7–7.7)
Neutrophils Relative %: 78 %
Platelet Count: 387 K/uL (ref 150–400)
RBC: 3.59 MIL/uL — ABNORMAL LOW (ref 3.87–5.11)
RDW: 14.6 % (ref 11.5–15.5)
WBC Count: 17 K/uL — ABNORMAL HIGH (ref 4.0–10.5)
nRBC: 0 % (ref 0.0–0.2)

## 2024-06-16 MED ORDER — SODIUM CHLORIDE 0.9 % IV SOLN: Freq: Once | INTRAVENOUS | Status: AC

## 2024-06-16 MED ORDER — SODIUM CHLORIDE 0.9 % IV SOLN
1500.0000 mg | Freq: Once | INTRAVENOUS | Status: AC
  Administered 2024-06-16: 1500 mg via INTRAVENOUS
  Filled 2024-06-16: qty 30

## 2024-06-16 NOTE — Progress Notes (Signed)
 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   PRIOR THERAPY: None   CURRENT THERAPY: Palliative systemic chemotherapy with carboplatin  for AUC of 5 on day 1, etoposide  100 mg/M2 on days 1, 2 and 3 as well as Imfinzi  1500 mg every 3 weeks with the chemotherapy and Neulasta  support. First dose on 03/16/2019. Status post 68 cycles.   Starting from cycle #5 the patient will be treated with maintenance treatment with Imfinzi  1500 mg IV every 4 weeks.  INTERVAL HISTORY: Brittany Marks 83 y.o. female returns to clinic today for follow-up visit.Discussed the use of AI scribe software for clinical note transcription with the patient, who gave verbal consent to proceed.  History of Present Illness Brittany Marks is an 83 year old female with extensive stage small cell lung cancer who presents for evaluation before starting cycle 69 of maintenance Imfinzi .  She was diagnosed with extensive stage small cell lung cancer in May 2020 and began palliative systemic chemo-immunotherapy with carboplatin , Etoposide , and Imfinzi . She completed four cycles every three weeks and has been on maintenance Imfinzi  every four weeks since cycle five. She is now post a total of 68 cycles.  Recently, she was hospitalized for cellulitis in her left arm, which was treated with antibiotics.  She resides in a retirement home, although she is unsure of the name, and her daughter visits occasionally. She is on home oxygen  therapy and reports no current complaints. No chest pain, nausea, vomiting, diarrhea, headaches, or any other issues  besides breathing difficulties.   MEDICAL HISTORY: Past Medical History:  Diagnosis Date   Acute CVA (cerebrovascular accident) (HCC) 12/07/2023   Cataract    Bilateral   Constipation    pain medication   Left rotator cuff tear    Right rotator cuff tear    SCL CA dx'd 11/2018    ALLERGIES:  is allergic to tape and oxycodone .  MEDICATIONS:  Current Outpatient Medications  Medication Sig Dispense Refill   acetaminophen  (TYLENOL ) 325 MG tablet Take 2 tablets (650 mg total) by mouth every 4 (four) hours as needed for mild pain (pain score 1-3) (or temp > 37.5 C (99.5 F)).     albuterol  (VENTOLIN  HFA) 108 (90 Base) MCG/ACT inhaler Inhale 2 puffs into the lungs every 6 (six) hours as needed for wheezing or shortness of breath. 8 g 2   ascorbic acid (VITAMIN C) 500 MG tablet Take 500 mg by mouth daily.     aspirin  EC 81 MG tablet Take 1 tablet (81 mg total) by mouth daily. Swallow whole.     atorvastatin  (LIPITOR) 40 MG tablet Take 1 tablet (40 mg total) by mouth daily. (Patient taking differently: Take 40 mg by mouth at bedtime.)     b complex vitamins tablet Take 1 tablet by mouth daily with breakfast.     durvalumab  1,500 mg in sodium chloride  0.9 % 100 mL Inject 1,500 mg into the vein every 28 (twenty-eight) days.     gabapentin  (NEURONTIN ) 100 MG capsule Take 1 capsule (100 mg total) by mouth 2 (two) times daily. 90 capsule 2   ibuprofen (  ADVIL) 600 MG tablet Take 600 mg by mouth every 12 (twelve) hours as needed (for pain or headaches).     Multiple Vitamin (MULTIVITAMIN) tablet Take 1 tablet by mouth daily with breakfast.     nicotine  (NICODERM CQ  - DOSED IN MG/24 HOURS) 14 mg/24hr patch Place 14 mg onto the skin daily.     nystatin  (MYCOSTATIN /NYSTOP ) powder Apply topically 3 (three) times daily. 15 g 0   ondansetron  (ZOFRAN ) 4 MG tablet Take 1 tablet (4 mg total) by mouth every 8 (eight) hours as needed for nausea or vomiting. 10 tablet 0   oxymetazoline (AFRIN) 0.05 % nasal spray  Place 2 sprays into both nostrils 2 (two) times daily.     senna-docusate (SENOKOT-S) 8.6-50 MG tablet Take 1 tablet by mouth at bedtime as needed for mild constipation. (Patient taking differently: Take 1 tablet by mouth daily as needed for mild constipation.)     No current facility-administered medications for this visit.   Facility-Administered Medications Ordered in Other Visits  Medication Dose Route Frequency Provider Last Rate Last Admin   heparin  lock flush 100 unit/mL  500 Units Intracatheter Once PRN Magrinat, Sandria BROCKS, MD       sodium chloride  flush (NS) 0.9 % injection 10 mL  10 mL Intracatheter PRN Magrinat, Sandria BROCKS, MD        SURGICAL HISTORY:  Past Surgical History:  Procedure Laterality Date   CATARACT EXTRACTION W/ INTRAOCULAR LENS  IMPLANT, BILATERAL  2012   DECOMPRESSIVE LUMBAR LAMINECTOMY LEVEL 2 N/A 07/29/2013   Procedure: LUMBAR LAMINECTOMY, DECOMPRESSION LUMBAR THREE TO FOUR, FOUR TO FIVE microdiscectomy l3,4 right;  Surgeon: Tanda DELENA Heading, MD;  Location: [facility];  Service: Orthopedics;  Laterality: N/A;   I & D SUPERIOR RIGHT SHOULDER AND CLOSURE WOUND  01-10-2011   S/P ROTATOR CUFF REPAIR   IR IMAGING GUIDED PORT INSERTION  04/09/2019   LUMBAR LAMINECTOMY  1970'S   LUMBAR LAMINECTOMY/DECOMPRESSION MICRODISCECTOMY N/A 06/27/2016   Procedure: L1 - L2 DISCECTOMY;  Surgeon: Donaciano Sprang, MD;  Location: [facility];  Service: Orthopedics;  Laterality: N/A;   LUMBAR SPINE SURGERY  1983   REVERSE SHOULDER ARTHROPLASTY Left 03/10/2020   Procedure: REVERSE SHOULDER ARTHROPLASTY;  Surgeon: Melita Drivers, MD;  Location: [facility];  Service: Orthopedics;  Laterality: Left;    RIGHT SHOULDER ARTHROSCOPY/ OPEN DISTAL CLAVICLE RESECTION/ SAD/ OPEN ROTATOR CUFF REPAIR  11-28-2010   SHOULDER FUSION SURGERY  03/11/2020   Shoulder Surgery , hardware placed   SHOULDER OPEN ROTATOR CUFF REPAIR  12/19/2011   Procedure: ROTATOR CUFF REPAIR SHOULDER OPEN;  Surgeon: Lynwood SHAUNNA Bern, MD;   Location: [facility];  Service: Orthopedics;  Laterality: Right;  RIGHT RECURRENT OPEN REPAIR OF THE ROTATOR CUFF WITH TISSUE MEND GRAFTANTERIOR CHROMIOECTOMY   VAGINAL HYSTERECTOMY  1979    REVIEW OF SYSTEMS:  Constitutional: positive for fatigue Eyes: negative Ears, nose, mouth, throat, and face: negative Respiratory: positive for dyspnea on exertion Cardiovascular: negative Gastrointestinal: negative Genitourinary:negative Integument/breast: negative Hematologic/lymphatic: negative Musculoskeletal:positive for arthralgias and muscle weakness Neurological: negative Behavioral/Psych: negative Endocrine: negative Allergic/Immunologic: negative   PHYSICAL EXAMINATION: General appearance: alert, cooperative, fatigued, and no distress Head: Normocephalic, without obvious abnormality, atraumatic Neck: no adenopathy, no JVD, supple, symmetrical, trachea midline, and thyroid  not enlarged, symmetric, no tenderness/mass/nodules Lymph nodes: Cervical, supraclavicular, and axillary nodes normal. Resp: clear to auscultation bilaterally Back: symmetric, no curvature. ROM normal. No CVA tenderness. Cardio: regular rate and rhythm, S1, S2 normal, no murmur, click, rub or gallop  GI: soft, non-tender; bowel sounds normal; no masses,  no organomegaly Extremities: extremities normal, atraumatic, no cyanosis or edema Neurologic: Alert and oriented X 3, normal strength and tone. Normal symmetric reflexes. Normal coordination and gait   ECOG PERFORMANCE STATUS: 1 - Symptomatic but completely ambulatory  Blood pressure (!) 128/98, pulse 96, temperature (!) 97 F (36.1 C), resp. rate 17, SpO2 97%.  LABORATORY DATA: Lab Results  Component Value Date   WBC 12.0 (H) 05/29/2024   HGB 9.7 (L) 05/29/2024   HCT 32.1 (L) 05/29/2024   MCV 95.8 05/29/2024   PLT 401 (H) 05/29/2024      Chemistry      Component Value Date/Time   NA 138 05/29/2024 0527   K 4.0 05/29/2024 0527   CL 109  05/29/2024 0527   CO2 21 (L) 05/29/2024 0527   BUN 16 05/29/2024 0527   CREATININE 0.81 05/29/2024 0527   CREATININE 1.03 (H) 05/19/2024 1135      Component Value Date/Time   CALCIUM  8.9 05/29/2024 0527   ALKPHOS 74 05/27/2024 1548   AST 18 05/27/2024 1548   AST 22 05/19/2024 1135   ALT 13 05/27/2024 1548   ALT 13 05/19/2024 1135   BILITOT 0.7 05/27/2024 1548   BILITOT 0.6 05/19/2024 1135       RADIOGRAPHIC STUDIES: UE VENOUS DUPLEX (7am - 7pm) Result Date: 05/27/2024 UPPER VENOUS STUDY  Patient Name:  LAVENDER STANKE  Date of Exam:   05/27/2024 Medical Rec #: [id number]          Accession #:    [id number] Date of Birth: 12-Jan-1941          Patient Gender: F Patient Age:   41 years Exam Location:  [facility] Procedure:      VAS US  UPPER EXTREMITY VENOUS DUPLEX Referring Phys: BURGESS NANAVATI --------------------------------------------------------------------------------  Indications: Edema, and redness, cancer hx Limitations: Painful axilla area with purulent drainage. Performing Technologist: Elmarie Lindau, RVT  Examination Guidelines: A complete evaluation includes B-mode imaging, spectral Doppler, color Doppler, and power Doppler as needed of all accessible portions of each vessel. Bilateral testing is considered an integral part of a complete examination. Limited examinations for reoccurring indications may be performed as noted.  Left Findings: +----------+------------+---------+-----------+----------+--------------+ LEFT      CompressiblePhasicitySpontaneousProperties   Summary     +----------+------------+---------+-----------+----------+--------------+ IJV           Full                                                 +----------+------------+---------+-----------+----------+--------------+ Subclavian               Yes                                       +----------+------------+---------+-----------+----------+--------------+ Axillary                                             Not visualized +----------+------------+---------+-----------+----------+--------------+ Brachial      Full                                                 +----------+------------+---------+-----------+----------+--------------+  Radial        Full                                                 +----------+------------+---------+-----------+----------+--------------+ Ulnar         Full                                                 +----------+------------+---------+-----------+----------+--------------+ Cephalic      Full                                                 +----------+------------+---------+-----------+----------+--------------+ Basilic       Full                                                 +----------+------------+---------+-----------+----------+--------------+  Summary:  Left: Limited exam, however there is no evidence of left upper extremity DVT/SVT in areas visualized.  *See table(s) above for measurements and observations.  Diagnosing physician: Gaile New MD Electronically signed by Gaile New MD on 05/27/2024 at 10:46:08 PM.    Final    CT Chest W Contrast Result Date: 05/27/2024 CLINICAL DATA:  Left axilla swelling and pain. History of lung cancer. EXAM: CT CHEST WITH CONTRAST TECHNIQUE: Multidetector CT imaging of the chest was performed during intravenous contrast administration. RADIATION DOSE REDUCTION: This exam was performed according to the departmental dose-optimization program which includes automated exposure control, adjustment of the mA and/or kV according to patient size and/or use of iterative reconstruction technique. CONTRAST:  75mL OMNIPAQUE  IOHEXOL  300 MG/ML  SOLN COMPARISON:  CT chest 03/18/2024 FINDINGS: Cardiovascular: No significant vascular findings. Normal heart size. No pericardial effusion. Right chest port catheter tip ends in the right atrium. Mediastinum/Nodes: There are enlarged left  hilar lymph nodes measuring up to 14 mm. Left hilar lymphadenopathy has slightly decreased in the interval. No new enlarged lymph nodes are identified. Visualized esophagus and thyroid  gland are within normal limits.There is no axillary lymphadenopathy Lungs/Pleura: Peripheral interstitial opacities are again seen scattered throughout both lungs. There are fibrotic changes in both lung bases. Bilateral lower lobe airspace disease has significantly decreased/resolved. Focal cavitary airspace opacity in the left upper lobe has decreased in size, but cavitation has increased. This now measures 2.7 By 4.6 Cm (previously 3.7 by 3.0 Cm). No new focal lung infiltrate identified. No pleural effusion or pneumothorax. Trachea and central airways are patent. Upper Abdomen: Prominent right extrarenal pelvis is partially imaged and grossly unchanged. Musculoskeletal: Degenerative changes affect the spine. Left shoulder arthroplasty is present. No acute fractures are seen. IMPRESSION: 1. Left upper lobe cavitary lesion has decreased in size, but cavitation has increased. 2. Left hilar lymphadenopathy has slightly decreased in the interval. 3. Bilateral lower lobe airspace disease has significantly decreased/resolved. 4. 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 5. No new focal lung infiltrate. 6. No acute fractures are seen. Electronically Signed   By: Greig Maple HERO.D.  On: 05/27/2024 18:37   DG Chest Port 1 View Result Date: 05/27/2024 CLINICAL DATA:  Questionable sepsis EXAM: PORTABLE CHEST 1 VIEW COMPARISON:  Chest x-ray 03/25/2024.  CT of the chest 03/18/2024. FINDINGS: Right chest port catheter tip ends in the distal SVC. The heart is enlarged. There are atherosclerotic calcifications of the aorta. Prominence of the upper right paratracheal region corresponds to ectatic vasculature. There are new patchy multifocal airspace opacities throughout both lungs, left greater than right. There  is no pleural effusion or pneumothorax. The osseous structures are stable no acute fractures are seen. IMPRESSION: 1. New patchy multifocal airspace opacities throughout both lungs, left greater than right, concerning for multifocal pneumonia. 2. Cardiomegaly. Electronically Signed   By: Greig Pique M.D.   On: 05/27/2024 15:36       ASSESSMENT AND PLAN: This is a very pleasant 83 years old white female with extensive stage small cell lung cancer and she is currently undergoing treatment with carboplatin , etoposide  and Imfinzi  status post 5 cycles.  Starting from cycle #6 the patient is on maintenance treatment with single agent Imfinzi  every 4 weeks status post 68 cycles of total treatment. She has been tolerating this treatment fairly well. She had repeat CT scan of the chest during her hospitalization that showed no concerning findings for disease progression. Assessment and Plan Assessment & Plan Extensive stage small cell lung cancer on active chemoimmunotherapy Extensive stage small cell lung cancer, diagnosed in May 2020, currently on maintenance Mfinzi every four weeks. She has completed 68 cycles and is here for evaluation before starting cycle 69. She wishes to continue with her current treatment regimen. - Proceed with cycle 69 of maintenance Mfinzi - Schedule follow-up in four weeks for next cycle evaluation  Cellulitis of left arm, recently treated Recent cellulitis of the left arm, treated with antibiotics during a recent hospital stay. No current complaints or signs of infection.  Chronic hypoxemia on home oxygen  Chronic hypoxemia managed with home oxygen  therapy. No new respiratory complaints. She is on home oxygen  continuously.  Goals of Care Discussion about hospice care was initiated, but she expressed a desire to continue with her current treatment regimen. She is not interested in hospice care at this time.  Follow-Up Follow-up plan discussed for ongoing treatment and  evaluation. - Schedule follow-up appointment in four weeks for next cycle evaluation She was advised to call immediately if she has any other concerning symptoms in the interval. The patient voices understanding of current disease status and treatment options and is in agreement with the current care plan.  All questions were answered. The patient knows to call the clinic with any problems, questions or concerns. We can certainly see the patient much sooner if necessary. The total time spent in the appointment was 30 minutes.  Disclaimer: This note was dictated with voice recognition software. Similar sounding words can inadvertently be transcribed and may not be corrected upon review.

## 2024-06-16 NOTE — Patient Instructions (Signed)

## 2024-06-16 NOTE — Progress Notes (Signed)
 Nutrition Assessment   Reason for Assessment: +MST   ASSESSMENT: 83 year old female with extensive stage small cell lung cancer. She is receiving maintenance imfinzi  q28d. Patient is under the care of Dr. Sherrod.   Past medical history includes acute CVA, chronic sCHF, chronic respiratory failure with O2 dependency, chemo-induced neuropathy, pathological fracture (R/L humerus), HLD, pressure injury  8/20-8/23 - admission with left upper arm cellulitis   Patient currently resides at [facility]  Met with patient in infusion. She is snacking on pretzels at visit. Patient reports poor po secondary to facility food. It is horrible. Patient says she is not offered choices. Suspect she is on [facility] diet given history. Patient is not offered Ensure regularly. Reports receiving maybe 2 times. Patient is allowed to have outside snacks, but states they would not last long. She reports wanting to drink more water , but pitcher is not refilled often. Patient is hoping to go home soon.   Nutrition Focused Physical Exam: deferred    Medications: Vit C, lipitor, Bcomplex, gabapentin , MVI, nicotine  patch, nystatin  powder, zofran , senna-s   Labs: reviewed    Anthropometrics: Pt unable to stand for wt today  Height: 5'3 Weight: 116 lb 13.5 oz (8/21) UBW: 140 lb (March) BMI: 20.7    NUTRITION DIAGNOSIS: Unintended wt loss related to chronic illness, recent admission as evidenced by 12% wt loss in 6 weeks. Per chart, pt 132 lb 11.5 oz on 6/11   INTERVENTION:  Pt currently resides at ALF - RD provided written recommendations for pt to provide to her care team.... Recommend liberalized diet to encourage increased po Recommend Ensure Plus/equivalent BID - provided samples of Ensure Complete + coupons Recommend Magic Cup (if available) on meal trays Contact information provided    MONITORING, EVALUATION, GOAL: Pt will tolerate increased calories and protein to minimize further wt loss   Next  Visit: Tuesday October 7 during infusion

## 2024-06-17 ENCOUNTER — Other Ambulatory Visit: Payer: Self-pay

## 2024-06-18 ENCOUNTER — Other Ambulatory Visit: Payer: Self-pay

## 2024-06-21 ENCOUNTER — Other Ambulatory Visit: Payer: Self-pay

## 2024-07-13 ENCOUNTER — Inpatient Hospital Stay

## 2024-07-13 ENCOUNTER — Inpatient Hospital Stay: Admitting: Internal Medicine

## 2024-07-13 ENCOUNTER — Telehealth: Payer: Self-pay

## 2024-07-13 NOTE — Telephone Encounter (Signed)
 I called the facility  the patient is staying at Because  she was late for her appointments. They stated that the  patients daughter did not know about today's appointments and would bring her next month to her appointments.  I made sure they knew the time and date of her next appointments and cancelled  them for today.

## 2024-07-14 ENCOUNTER — Inpatient Hospital Stay: Admitting: Dietician

## 2024-07-14 ENCOUNTER — Other Ambulatory Visit

## 2024-07-14 ENCOUNTER — Ambulatory Visit

## 2024-07-14 ENCOUNTER — Ambulatory Visit: Admitting: Physician Assistant

## 2024-07-14 ENCOUNTER — Encounter: Admitting: Dietician

## 2024-07-20 ENCOUNTER — Other Ambulatory Visit: Payer: Self-pay

## 2024-07-22 ENCOUNTER — Other Ambulatory Visit: Payer: Self-pay

## 2024-08-10 ENCOUNTER — Telehealth: Payer: Self-pay

## 2024-08-10 NOTE — Telephone Encounter (Signed)
 Faxed signed O2 order to [employer] with most recent notes with confirmation at [phone].

## 2024-08-11 ENCOUNTER — Ambulatory Visit

## 2024-08-11 ENCOUNTER — Ambulatory Visit: Admitting: Physician Assistant

## 2024-08-11 ENCOUNTER — Other Ambulatory Visit

## 2024-08-13 ENCOUNTER — Inpatient Hospital Stay

## 2024-08-13 ENCOUNTER — Emergency Department (HOSPITAL_COMMUNITY)

## 2024-08-13 ENCOUNTER — Telehealth: Payer: Self-pay | Admitting: *Deleted

## 2024-08-13 ENCOUNTER — Inpatient Hospital Stay: Attending: Internal Medicine

## 2024-08-13 ENCOUNTER — Inpatient Hospital Stay (HOSPITAL_COMMUNITY)
Admission: EM | Admit: 2024-08-13 | Discharge: 2024-09-07 | DRG: 871 | Disposition: E | Source: Ambulatory Visit | Attending: Internal Medicine | Admitting: Internal Medicine

## 2024-08-13 ENCOUNTER — Other Ambulatory Visit: Payer: Self-pay

## 2024-08-13 ENCOUNTER — Inpatient Hospital Stay: Admitting: Dietician

## 2024-08-13 ENCOUNTER — Telehealth: Payer: Self-pay

## 2024-08-13 ENCOUNTER — Inpatient Hospital Stay: Attending: Internal Medicine | Admitting: Internal Medicine

## 2024-08-13 VITALS — BP 103/56 | HR 84 | Temp 97.4°F | Resp 17 | Ht 63.0 in | Wt 116.0 lb

## 2024-08-13 DIAGNOSIS — Z8673 Personal history of transient ischemic attack (TIA), and cerebral infarction without residual deficits: Secondary | ICD-10-CM | POA: Insufficient documentation

## 2024-08-13 DIAGNOSIS — E785 Hyperlipidemia, unspecified: Secondary | ICD-10-CM | POA: Diagnosis present

## 2024-08-13 DIAGNOSIS — I959 Hypotension, unspecified: Secondary | ICD-10-CM

## 2024-08-13 DIAGNOSIS — R627 Adult failure to thrive: Secondary | ICD-10-CM | POA: Diagnosis present

## 2024-08-13 DIAGNOSIS — N179 Acute kidney failure, unspecified: Principal | ICD-10-CM | POA: Diagnosis present

## 2024-08-13 DIAGNOSIS — E8721 Acute metabolic acidosis: Secondary | ICD-10-CM | POA: Diagnosis not present

## 2024-08-13 DIAGNOSIS — C3411 Malignant neoplasm of upper lobe, right bronchus or lung: Secondary | ICD-10-CM | POA: Diagnosis present

## 2024-08-13 DIAGNOSIS — C3490 Malignant neoplasm of unspecified part of unspecified bronchus or lung: Secondary | ICD-10-CM

## 2024-08-13 DIAGNOSIS — C3492 Malignant neoplasm of unspecified part of left bronchus or lung: Secondary | ICD-10-CM | POA: Diagnosis not present

## 2024-08-13 DIAGNOSIS — F17210 Nicotine dependence, cigarettes, uncomplicated: Secondary | ICD-10-CM | POA: Diagnosis present

## 2024-08-13 DIAGNOSIS — I9589 Other hypotension: Secondary | ICD-10-CM | POA: Diagnosis present

## 2024-08-13 DIAGNOSIS — R6521 Severe sepsis with septic shock: Secondary | ICD-10-CM | POA: Diagnosis present

## 2024-08-13 DIAGNOSIS — R590 Localized enlarged lymph nodes: Secondary | ICD-10-CM | POA: Insufficient documentation

## 2024-08-13 DIAGNOSIS — Z9221 Personal history of antineoplastic chemotherapy: Secondary | ICD-10-CM | POA: Insufficient documentation

## 2024-08-13 DIAGNOSIS — J449 Chronic obstructive pulmonary disease, unspecified: Secondary | ICD-10-CM | POA: Diagnosis present

## 2024-08-13 DIAGNOSIS — A419 Sepsis, unspecified organism: Secondary | ICD-10-CM | POA: Diagnosis present

## 2024-08-13 DIAGNOSIS — I5023 Acute on chronic systolic (congestive) heart failure: Secondary | ICD-10-CM | POA: Diagnosis not present

## 2024-08-13 DIAGNOSIS — D649 Anemia, unspecified: Secondary | ICD-10-CM | POA: Diagnosis not present

## 2024-08-13 DIAGNOSIS — R7989 Other specified abnormal findings of blood chemistry: Secondary | ICD-10-CM

## 2024-08-13 DIAGNOSIS — R52 Pain, unspecified: Secondary | ICD-10-CM

## 2024-08-13 DIAGNOSIS — R451 Restlessness and agitation: Secondary | ICD-10-CM

## 2024-08-13 DIAGNOSIS — I639 Cerebral infarction, unspecified: Secondary | ICD-10-CM | POA: Diagnosis present

## 2024-08-13 DIAGNOSIS — Z96612 Presence of left artificial shoulder joint: Secondary | ICD-10-CM | POA: Diagnosis present

## 2024-08-13 DIAGNOSIS — Z515 Encounter for palliative care: Secondary | ICD-10-CM

## 2024-08-13 DIAGNOSIS — E876 Hypokalemia: Secondary | ICD-10-CM | POA: Diagnosis present

## 2024-08-13 DIAGNOSIS — E46 Unspecified protein-calorie malnutrition: Secondary | ICD-10-CM | POA: Diagnosis present

## 2024-08-13 DIAGNOSIS — I5043 Acute on chronic combined systolic (congestive) and diastolic (congestive) heart failure: Secondary | ICD-10-CM | POA: Diagnosis present

## 2024-08-13 DIAGNOSIS — Z711 Person with feared health complaint in whom no diagnosis is made: Secondary | ICD-10-CM | POA: Diagnosis not present

## 2024-08-13 DIAGNOSIS — E861 Hypovolemia: Secondary | ICD-10-CM | POA: Diagnosis present

## 2024-08-13 DIAGNOSIS — Z681 Body mass index (BMI) 19 or less, adult: Secondary | ICD-10-CM

## 2024-08-13 DIAGNOSIS — Z7982 Long term (current) use of aspirin: Secondary | ICD-10-CM | POA: Insufficient documentation

## 2024-08-13 DIAGNOSIS — Z9981 Dependence on supplemental oxygen: Secondary | ICD-10-CM

## 2024-08-13 DIAGNOSIS — K8000 Calculus of gallbladder with acute cholecystitis without obstruction: Secondary | ICD-10-CM | POA: Diagnosis present

## 2024-08-13 DIAGNOSIS — Z7189 Other specified counseling: Secondary | ICD-10-CM

## 2024-08-13 DIAGNOSIS — R7401 Elevation of levels of liver transaminase levels: Secondary | ICD-10-CM | POA: Diagnosis not present

## 2024-08-13 DIAGNOSIS — I5022 Chronic systolic (congestive) heart failure: Secondary | ICD-10-CM | POA: Diagnosis present

## 2024-08-13 DIAGNOSIS — E872 Acidosis, unspecified: Secondary | ICD-10-CM | POA: Diagnosis present

## 2024-08-13 DIAGNOSIS — C7951 Secondary malignant neoplasm of bone: Secondary | ICD-10-CM | POA: Diagnosis present

## 2024-08-13 DIAGNOSIS — R748 Abnormal levels of other serum enzymes: Secondary | ICD-10-CM | POA: Diagnosis not present

## 2024-08-13 DIAGNOSIS — Z91048 Other nonmedicinal substance allergy status: Secondary | ICD-10-CM

## 2024-08-13 DIAGNOSIS — Z79899 Other long term (current) drug therapy: Secondary | ICD-10-CM | POA: Insufficient documentation

## 2024-08-13 DIAGNOSIS — R918 Other nonspecific abnormal finding of lung field: Secondary | ICD-10-CM | POA: Diagnosis not present

## 2024-08-13 DIAGNOSIS — Z66 Do not resuscitate: Secondary | ICD-10-CM | POA: Diagnosis present

## 2024-08-13 DIAGNOSIS — N390 Urinary tract infection, site not specified: Secondary | ICD-10-CM | POA: Diagnosis not present

## 2024-08-13 DIAGNOSIS — J9611 Chronic respiratory failure with hypoxia: Secondary | ICD-10-CM | POA: Diagnosis present

## 2024-08-13 DIAGNOSIS — R54 Age-related physical debility: Secondary | ICD-10-CM | POA: Diagnosis present

## 2024-08-13 DIAGNOSIS — K5900 Constipation, unspecified: Secondary | ICD-10-CM | POA: Insufficient documentation

## 2024-08-13 DIAGNOSIS — Z885 Allergy status to narcotic agent status: Secondary | ICD-10-CM

## 2024-08-13 DIAGNOSIS — L8990 Pressure ulcer of unspecified site, unspecified stage: Secondary | ICD-10-CM | POA: Diagnosis present

## 2024-08-13 DIAGNOSIS — F419 Anxiety disorder, unspecified: Secondary | ICD-10-CM | POA: Diagnosis present

## 2024-08-13 DIAGNOSIS — D638 Anemia in other chronic diseases classified elsewhere: Secondary | ICD-10-CM | POA: Diagnosis present

## 2024-08-13 DIAGNOSIS — Z961 Presence of intraocular lens: Secondary | ICD-10-CM | POA: Diagnosis present

## 2024-08-13 DIAGNOSIS — L89152 Pressure ulcer of sacral region, stage 2: Secondary | ICD-10-CM | POA: Diagnosis present

## 2024-08-13 DIAGNOSIS — Z9841 Cataract extraction status, right eye: Secondary | ICD-10-CM

## 2024-08-13 DIAGNOSIS — I5033 Acute on chronic diastolic (congestive) heart failure: Secondary | ICD-10-CM | POA: Diagnosis not present

## 2024-08-13 DIAGNOSIS — Z9842 Cataract extraction status, left eye: Secondary | ICD-10-CM

## 2024-08-13 DIAGNOSIS — Z8249 Family history of ischemic heart disease and other diseases of the circulatory system: Secondary | ICD-10-CM

## 2024-08-13 DIAGNOSIS — K819 Cholecystitis, unspecified: Secondary | ICD-10-CM | POA: Diagnosis not present

## 2024-08-13 DIAGNOSIS — Z90710 Acquired absence of both cervix and uterus: Secondary | ICD-10-CM

## 2024-08-13 LAB — PRO BRAIN NATRIURETIC PEPTIDE: Pro Brain Natriuretic Peptide: 4063 pg/mL — ABNORMAL HIGH (ref <300.0)

## 2024-08-13 LAB — CBC WITH DIFFERENTIAL (CANCER CENTER ONLY)
Abs Immature Granulocytes: 0.04 K/uL (ref 0.00–0.07)
Basophils Absolute: 0.1 K/uL (ref 0.0–0.1)
Basophils Relative: 1 %
Eosinophils Absolute: 0.1 K/uL (ref 0.0–0.5)
Eosinophils Relative: 1 %
HCT: 26 % — ABNORMAL LOW (ref 36.0–46.0)
Hemoglobin: 9.2 g/dL — ABNORMAL LOW (ref 12.0–15.0)
Immature Granulocytes: 0 %
Lymphocytes Relative: 15 %
Lymphs Abs: 1.7 K/uL (ref 0.7–4.0)
MCH: 28.8 pg (ref 26.0–34.0)
MCHC: 35.4 g/dL (ref 30.0–36.0)
MCV: 81.5 fL (ref 80.0–100.0)
Monocytes Absolute: 0.9 K/uL (ref 0.1–1.0)
Monocytes Relative: 8 %
Neutro Abs: 8.5 K/uL — ABNORMAL HIGH (ref 1.7–7.7)
Neutrophils Relative %: 75 %
Platelet Count: 309 K/uL (ref 150–400)
RBC: 3.19 MIL/uL — ABNORMAL LOW (ref 3.87–5.11)
RDW: 16.2 % — ABNORMAL HIGH (ref 11.5–15.5)
WBC Count: 11.3 K/uL — ABNORMAL HIGH (ref 4.0–10.5)
nRBC: 0 % (ref 0.0–0.2)

## 2024-08-13 LAB — URINALYSIS, W/ REFLEX TO CULTURE (INFECTION SUSPECTED)
Bilirubin Urine: NEGATIVE
Glucose, UA: NEGATIVE mg/dL
Ketones, ur: NEGATIVE mg/dL
Nitrite: NEGATIVE
Protein, ur: 30 mg/dL — AB
Specific Gravity, Urine: 1.013 (ref 1.005–1.030)
WBC, UA: >50 WBC/hpf (ref 0–5)
pH: 5 (ref 5.0–8.0)

## 2024-08-13 LAB — CBC
HCT: 28.3 % — ABNORMAL LOW (ref 36.0–46.0)
Hemoglobin: 9.2 g/dL — ABNORMAL LOW (ref 12.0–15.0)
MCH: 28.6 pg (ref 26.0–34.0)
MCHC: 32.5 g/dL (ref 30.0–36.0)
MCV: 87.9 fL (ref 80.0–100.0)
Platelets: 289 K/uL (ref 150–400)
RBC: 3.22 MIL/uL — ABNORMAL LOW (ref 3.87–5.11)
RDW: 16.4 % — ABNORMAL HIGH (ref 11.5–15.5)
WBC: 13 K/uL — ABNORMAL HIGH (ref 4.0–10.5)
nRBC: 0 % (ref 0.0–0.2)

## 2024-08-13 LAB — CMP (CANCER CENTER ONLY)
ALT: 164 U/L — ABNORMAL HIGH (ref 0–44)
AST: 314 U/L (ref 15–41)
Albumin: 2.7 g/dL — ABNORMAL LOW (ref 3.5–5.0)
Alkaline Phosphatase: 494 U/L — ABNORMAL HIGH (ref 38–126)
Anion gap: 9 (ref 5–15)
BUN: 38 mg/dL — ABNORMAL HIGH (ref 8–23)
CO2: 19 mmol/L — ABNORMAL LOW (ref 22–32)
Calcium: 8.5 mg/dL — ABNORMAL LOW (ref 8.9–10.3)
Chloride: 105 mmol/L (ref 98–111)
Creatinine: 1.91 mg/dL — ABNORMAL HIGH (ref 0.44–1.00)
GFR, Estimated: 26 mL/min — ABNORMAL LOW (ref >60)
Glucose, Bld: 96 mg/dL (ref 70–99)
Potassium: 4.5 mmol/L (ref 3.5–5.1)
Sodium: 133 mmol/L — ABNORMAL LOW (ref 135–145)
Total Bilirubin: 1.8 mg/dL — ABNORMAL HIGH (ref 0.0–1.2)
Total Protein: 6.8 g/dL (ref 6.5–8.1)

## 2024-08-13 LAB — BASIC METABOLIC PANEL WITH GFR
Anion gap: 8 (ref 5–15)
BUN: 34 mg/dL — ABNORMAL HIGH (ref 8–23)
CO2: 18 mmol/L — ABNORMAL LOW (ref 22–32)
Calcium: 8 mg/dL — ABNORMAL LOW (ref 8.9–10.3)
Chloride: 107 mmol/L (ref 98–111)
Creatinine, Ser: 1.78 mg/dL — ABNORMAL HIGH (ref 0.44–1.00)
GFR, Estimated: 28 mL/min — ABNORMAL LOW (ref >60)
Glucose, Bld: 106 mg/dL — ABNORMAL HIGH (ref 70–99)
Potassium: 4.6 mmol/L (ref 3.5–5.1)
Sodium: 133 mmol/L — ABNORMAL LOW (ref 135–145)

## 2024-08-13 LAB — COMPREHENSIVE METABOLIC PANEL WITH GFR
ALT: 203 U/L — ABNORMAL HIGH (ref 0–44)
AST: 386 U/L — ABNORMAL HIGH (ref 15–41)
Albumin: 2.9 g/dL — ABNORMAL LOW (ref 3.5–5.0)
Alkaline Phosphatase: 595 U/L — ABNORMAL HIGH (ref 38–126)
Anion gap: 15 (ref 5–15)
BUN: 36 mg/dL — ABNORMAL HIGH (ref 8–23)
CO2: 15 mmol/L — ABNORMAL LOW (ref 22–32)
Calcium: 8.8 mg/dL — ABNORMAL LOW (ref 8.9–10.3)
Chloride: 103 mmol/L (ref 98–111)
Creatinine, Ser: 1.99 mg/dL — ABNORMAL HIGH (ref 0.44–1.00)
GFR, Estimated: 24 mL/min — ABNORMAL LOW (ref >60)
Glucose, Bld: 108 mg/dL — ABNORMAL HIGH (ref 70–99)
Potassium: 4.7 mmol/L (ref 3.5–5.1)
Sodium: 133 mmol/L — ABNORMAL LOW (ref 135–145)
Total Bilirubin: 1.6 mg/dL — ABNORMAL HIGH (ref 0.0–1.2)
Total Protein: 7.3 g/dL (ref 6.5–8.1)

## 2024-08-13 LAB — PROTIME-INR
INR: 1.2 (ref 0.8–1.2)
Prothrombin Time: 15.4 s — ABNORMAL HIGH (ref 11.4–15.2)

## 2024-08-13 LAB — BLOOD GAS, VENOUS
Acid-base deficit: 6.2 mmol/L — ABNORMAL HIGH (ref 0.0–2.0)
Bicarbonate: 19 mmol/L — ABNORMAL LOW (ref 20.0–28.0)
O2 Saturation: 75 %
Patient temperature: 36.4
pCO2, Ven: 35 mmHg — ABNORMAL LOW (ref 44–60)
pH, Ven: 7.34 (ref 7.25–7.43)
pO2, Ven: 40 mmHg (ref 32–45)

## 2024-08-13 LAB — I-STAT CG4 LACTIC ACID, ED: Lactic Acid, Venous: 1.7 mmol/L (ref 0.5–1.9)

## 2024-08-13 LAB — MRSA NEXT GEN BY PCR, NASAL: MRSA by PCR Next Gen: NOT DETECTED

## 2024-08-13 LAB — LACTIC ACID, PLASMA: Lactic Acid, Venous: 0.8 mmol/L (ref 0.5–1.9)

## 2024-08-13 MED ORDER — MIDODRINE HCL 5 MG PO TABS: 10.0000 mg | ORAL_TABLET | Freq: Three times a day (TID) | ORAL | Status: DC

## 2024-08-13 MED ORDER — VANCOMYCIN HCL 750 MG/150ML IV SOLN: 750.0000 mg | INTRAVENOUS | Status: DC

## 2024-08-13 MED ORDER — SODIUM CHLORIDE 0.9 % IV BOLUS
2000.0000 mL | Freq: Once | INTRAVENOUS | Status: AC
  Administered 2024-08-13: 2000 mL via INTRAVENOUS

## 2024-08-13 MED ORDER — CHLORHEXIDINE GLUCONATE CLOTH 2 % EX PADS
6.0000 | MEDICATED_PAD | Freq: Every day | CUTANEOUS | Status: DC
  Administered 2024-08-13 – 2024-08-15 (×2): 6 via TOPICAL

## 2024-08-13 MED ORDER — NOREPINEPHRINE 4 MG/250ML-% IV SOLN: 0.0000 ug/min | INTRAVENOUS | Status: DC

## 2024-08-13 MED ORDER — SODIUM CHLORIDE 0.9 % IV SOLN
2.0000 g | Freq: Once | INTRAVENOUS | Status: AC
  Administered 2024-08-13: 2 g via INTRAVENOUS
  Filled 2024-08-13: qty 12.5

## 2024-08-13 MED ORDER — METRONIDAZOLE 500 MG/100ML IV SOLN
500.0000 mg | Freq: Once | INTRAVENOUS | Status: AC
  Administered 2024-08-13: 500 mg via INTRAVENOUS
  Filled 2024-08-13: qty 100

## 2024-08-13 MED ORDER — HEPARIN SODIUM (PORCINE) 5000 UNIT/ML IJ SOLN
5000.0000 [IU] | Freq: Three times a day (TID) | INTRAMUSCULAR | Status: DC
  Administered 2024-08-13 – 2024-08-15 (×5): 5000 [IU] via SUBCUTANEOUS
  Filled 2024-08-13 (×5): qty 1

## 2024-08-13 MED ORDER — MIDODRINE HCL 5 MG PO TABS: 10.0000 mg | ORAL_TABLET | Freq: Once | ORAL | Status: DC

## 2024-08-13 MED ORDER — LACTATED RINGERS IV BOLUS (SEPSIS)
500.0000 mL | Freq: Once | INTRAVENOUS | Status: AC
Start: 2024-08-13 — End: 2024-08-13
  Administered 2024-08-13: 500 mL via INTRAVENOUS

## 2024-08-13 MED ORDER — LACTATED RINGERS IV BOLUS (SEPSIS)
250.0000 mL | Freq: Once | INTRAVENOUS | Status: AC
  Administered 2024-08-13: 250 mL via INTRAVENOUS

## 2024-08-13 MED ORDER — ALBUTEROL SULFATE (2.5 MG/3ML) 0.083% IN NEBU: 2.5000 mg | INHALATION_SOLUTION | Freq: Four times a day (QID) | RESPIRATORY_TRACT | Status: DC | PRN

## 2024-08-13 MED ORDER — VANCOMYCIN HCL IN DEXTROSE 1-5 GM/200ML-% IV SOLN
1000.0000 mg | Freq: Once | INTRAVENOUS | Status: AC
  Administered 2024-08-13: 1000 mg via INTRAVENOUS
  Filled 2024-08-13: qty 200

## 2024-08-13 MED ORDER — LACTATED RINGERS IV SOLN: INTRAVENOUS | Status: DC

## 2024-08-13 MED ORDER — MIDODRINE HCL 5 MG PO TABS
10.0000 mg | ORAL_TABLET | Freq: Once | ORAL | Status: AC
  Administered 2024-08-13: 10 mg via ORAL
  Filled 2024-08-13: qty 2

## 2024-08-13 MED ORDER — SODIUM CHLORIDE 0.9 % IV SOLN: 250.0000 mL | INTRAVENOUS | Status: AC

## 2024-08-13 MED ORDER — LACTATED RINGERS IV BOLUS (SEPSIS)
1000.0000 mL | Freq: Once | INTRAVENOUS | Status: AC
  Administered 2024-08-13: 1000 mL via INTRAVENOUS

## 2024-08-13 MED ORDER — SODIUM CHLORIDE 0.9 % IV SOLN: 2.0000 g | INTRAVENOUS | Status: DC

## 2024-08-13 MED ORDER — SODIUM BICARBONATE 650 MG PO TABS
650.0000 mg | ORAL_TABLET | Freq: Three times a day (TID) | ORAL | Status: DC
  Administered 2024-08-13 – 2024-08-14 (×2): 650 mg via ORAL
  Filled 2024-08-13 (×3): qty 1

## 2024-08-13 MED ORDER — METHYLPREDNISOLONE 4 MG PO TBPK: ORAL_TABLET | ORAL | 0 refills | Status: DC

## 2024-08-13 NOTE — ED Provider Notes (Signed)
 [place] EMERGENCY DEPARTMENT AT [facility] Provider Note   CSN: [id number] Arrival date & time: 08/13/24  1417     Patient presents with: Hypotension   Brittany Marks is a 83 y.o. female.   HPI   Patient has a history of rotator cuff surgery, stroke, small cell lung cancer.  Patient treated with palliative systemic chemotherapy.  Patient is on maintenance Imfinzi .  Patient states she had a routine follow-up appointment with Dr. Gatha today.  She was not having any acute complaints.  At baseline patient does not participate in physical therapy she does not ambulate.  She is at [facility] nursing facility.  Patient had laboratory test today that showed stable hemoglobin of 9.2.  Patient was sent to the ED for admission further evaluation Her metabolic panel showed increased BUN/creatinine of 38 1.91.  Her creatinine 1 month ago was 0.65. Prior to Admission medications   Medication Sig Start Date End Date Taking? Authorizing Provider  acetaminophen  (TYLENOL ) 325 MG tablet Take 2 tablets (650 mg total) by mouth every 4 (four) hours as needed for mild pain (pain score 1-3) (or temp > 37.5 C (99.5 F)). 12/11/23   [place], Camellia PARAS, DO  albuterol  (VENTOLIN  HFA) 108 (90 Base) MCG/ACT inhaler Inhale 2 puffs into the lungs every 6 (six) hours as needed for wheezing or shortness of breath. 08/07/22   Heilingoetter, Cassandra L, PA-C  ascorbic acid (VITAMIN C) 500 MG tablet Take 500 mg by mouth daily.    [provider]  aspirin  EC 81 MG tablet Take 1 tablet (81 mg total) by mouth daily. Swallow whole. 12/12/23   [place], Eric J, DO  atorvastatin  (LIPITOR) 40 MG tablet Take 1 tablet (40 mg total) by mouth daily. Patient taking differently: Take 40 mg by mouth at bedtime. 12/12/23   [place], Camellia PARAS, DO  b complex vitamins tablet Take 1 tablet by mouth daily with breakfast.    [provider]  durvalumab  1,500 mg in sodium chloride  0.9 % 100 mL Inject 1,500 mg into the vein  every 28 (twenty-eight) days.    [provider]  gabapentin  (NEURONTIN ) 100 MG capsule Take 1 capsule (100 mg total) by mouth 2 (two) times daily. 12/03/23   Sherrod Sherrod, MD  ibuprofen (ADVIL) 600 MG tablet Take 600 mg by mouth every 12 (twelve) hours as needed (for pain or headaches). 03/17/24   [provider]  methylPREDNISolone  (MEDROL  DOSEPAK) 4 MG TBPK tablet Use as instructed 08/13/24   Sherrod Sherrod, MD  Multiple Vitamin (MULTIVITAMIN) tablet Take 1 tablet by mouth daily with breakfast.    [provider]  nicotine  (NICODERM CQ  - DOSED IN MG/24 HOURS) 14 mg/24hr patch Place 14 mg onto the skin daily.    [provider]  nystatin  (MYCOSTATIN /NYSTOP ) powder Apply topically 3 (three) times daily. 05/29/24   Vernon Ranks, MD  ondansetron  (ZOFRAN ) 4 MG tablet Take 1 tablet (4 mg total) by mouth every 8 (eight) hours as needed for nausea or vomiting. 03/11/20   Shuford, Randine, PA-C  oxymetazoline (AFRIN) 0.05 % nasal spray Place 2 sprays into both nostrils 2 (two) times daily.    [provider]  senna-docusate (SENOKOT-S) 8.6-50 MG tablet Take 1 tablet by mouth at bedtime as needed for mild constipation. Patient taking differently: Take 1 tablet by mouth daily as needed for mild constipation. 12/11/23   [place], Camellia PARAS, DO    Allergies: Tape and Oxycodone     Review of Systems  Updated Vital  Signs BP (!) 153/91   Pulse 82   Temp (!) 97.5 F (36.4 C) (Oral)   Resp 17   SpO2 97%   Physical Exam Vitals and nursing note reviewed.  Constitutional:      Appearance: She is well-developed. She is not diaphoretic.     Comments: Elderly frail  HENT:     Head: Normocephalic and atraumatic.     Right Ear: External ear normal.     Left Ear: External ear normal.  Eyes:     General: No scleral icterus.       Right eye: No discharge.        Left eye: No discharge.     Conjunctiva/sclera: Conjunctivae normal.  Neck:     Trachea: No tracheal  deviation.  Cardiovascular:     Rate and Rhythm: Normal rate and regular rhythm.  Pulmonary:     Effort: Pulmonary effort is normal. No respiratory distress.     Breath sounds: Normal breath sounds. No stridor. No wheezing or rales.  Abdominal:     General: Bowel sounds are normal. There is no distension.     Palpations: Abdomen is soft.     Tenderness: There is no abdominal tenderness. There is no guarding or rebound.  Musculoskeletal:        General: No tenderness or deformity.     Cervical back: Neck supple.  Skin:    General: Skin is warm and dry.     Findings: No rash.  Neurological:     General: No focal deficit present.     Mental Status: She is alert.     Cranial Nerves: No cranial nerve deficit, dysarthria or facial asymmetry.     Sensory: No sensory deficit.     Motor: No abnormal muscle tone or seizure activity.     Coordination: Coordination normal.  Psychiatric:        Mood and Affect: Mood normal.     (all labs ordered are listed, but only abnormal results are displayed) Labs Reviewed  CBC - Abnormal; Notable for the following components:      Result Value   WBC 13.0 (*)    RBC 3.22 (*)    Hemoglobin 9.2 (*)    HCT 28.3 (*)    RDW 16.4 (*)    All other components within normal limits  COMPREHENSIVE METABOLIC PANEL WITH GFR - Abnormal; Notable for the following components:   Sodium 133 (*)    CO2 15 (*)    Glucose, Bld 108 (*)    BUN 36 (*)    Creatinine, Ser 1.99 (*)    Calcium  8.8 (*)    Albumin 2.9 (*)    AST 386 (*)    ALT 203 (*)    Alkaline Phosphatase 595 (*)    Total Bilirubin 1.6 (*)    GFR, Estimated 24 (*)    All other components within normal limits  PROTIME-INR - Abnormal; Notable for the following components:   Prothrombin Time 15.4 (*)    All other components within normal limits  CULTURE, BLOOD (ROUTINE X 2)  CULTURE, BLOOD (ROUTINE X 2)  URINALYSIS, W/ REFLEX TO CULTURE (INFECTION SUSPECTED)  I-STAT CG4 LACTIC ACID, ED     EKG: EKG Interpretation Date/Time:  Thursday August 13 2024 15:19:12 EST Ventricular Rate:  81 PR Interval:  63 QRS Duration:  114 QT Interval:  390 QTC Calculation: 453 R Axis:   51  Text Interpretation: Sinus rhythm Short PR interval Inferior infarct, age indeterminate  No significant change since last tracing Confirmed by Randol Simmonds [phone]) on 08/13/2024 3:47:42 PM  Radiology: [facility] Chest Port 1 View Result Date: 08/13/2024 CLINICAL DATA:  Questionable sepsis EXAM: PORTABLE CHEST 1 VIEW COMPARISON:  05/27/2024 FINDINGS: Single frontal view of the chest demonstrates right chest wall port tip overlying superior vena cava. The cardiac silhouette is unremarkable. Chronic areas of scarring and fibrosis are again seen throughout the lungs. Chronic left upper lobe cavitary lesion is unchanged since recent CT. No new airspace disease, effusion, or pneumothorax. No acute bony abnormalities. Chronic nonunion proximal right humeral fracture. Unremarkable left shoulder arthroplasty. IMPRESSION: 1. Persistent left upper lobe cavitary lesion, not appreciably changed since prior CT. 2. No new airspace disease. 3. Chronic background scarring and fibrosis. Electronically Signed   By: Ozell Daring M.D.   On: 08/13/2024 15:50     Procedures   Medications Ordered in the ED  lactated ringers  infusion ( Intravenous New Bag/Given 08/13/24 1537)  lactated ringers  bolus 1,000 mL (1,000 mLs Intravenous New Bag/Given 08/13/24 1536)    And  lactated ringers  bolus 500 mL (has no administration in time range)    And  lactated ringers  bolus 250 mL (has no administration in time range)  metroNIDAZOLE (FLAGYL) IVPB 500 mg (has no administration in time range)  vancomycin  (VANCOCIN ) IVPB 1000 mg/200 mL premix (has no administration in time range)  sodium chloride  0.9 % bolus 2,000 mL (2,000 mLs Intravenous New Bag/Given 08/13/24 1449)  ceFEPIme  (MAXIPIME ) 2 g in sodium chloride  0.9 % 100 mL IVPB (2 g Intravenous  New Bag/Given 08/13/24 1537)    Clinical Course as of 08/13/24 1624  Thu Aug 13, 2024  1529 Blood pressure initially documented in the 70s systolic.  Most recent blood pressure improved at 153/91.  IV fluids are infusing [JK]  1616 Discussed with Dr. Arthea regarding admission [JK]  1617 Labs reviewed.  Creatinine is elevated compared to previous values prior to today.  Leukocytosis noted but this is unchanged from previous.  Hemoglobin stable compared to previous no lactic acidosis noted [JK]  1617 X-ray does not show pneumonia no change in her known cavitary lung lesion [JK]    Clinical Course User Index [JK] Randol Simmonds, MD                                 Medical Decision Making Problems Addressed: Abnormal LFTs: acute illness or injury that poses a threat to life or bodily functions AKI (acute kidney injury): acute illness or injury that poses a threat to life or bodily functions Malignant neoplasm of left lung, unspecified part of lung [facility]): chronic illness or injury  Amount and/or Complexity of Data Reviewed Labs: ordered. Decision-making details documented in ED Course. Radiology: ordered and independent interpretation performed.  Risk Prescription drug management. Decision regarding hospitalization.   Patient presented to the ED for evaluation of an acute kidney injury.  Patient also noted to have increasing LFT abnormalities.  Patient denies any complaints of abdominal pain.  No fevers or chills.  Initially treated for possible evolving sepsis with her hypotension on arrival  No clear signs of infection.  No lactic acidosis.  Suspect symptoms more related to dehydration  Patient has been started on IV fluids and IV antibiotics.  Will ultrasound her liver considering her increasing LFTs in the setting of known lung cancer..  Blood pressure is improved with IV fluid hydration.  Will admit to the hospital  for further treatment     Final diagnoses:  AKI (acute kidney  injury)  Malignant neoplasm of left lung, unspecified part of lung (HCC)  Abnormal LFTs    ED Discharge Orders     None          Randol Simmonds, MD 08/13/24 1624

## 2024-08-13 NOTE — ED Notes (Signed)
 Pt incontinent of stool, cleaned pt and applied a new brief, warm blanket provided.

## 2024-08-13 NOTE — Progress Notes (Addendum)
 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 Intermittently hypotensive, being treated with midodrine and fluids.    Vital signs improved after initial resuscitation.  Saturating 99% on baseline 2 L of oxygen .  Results consistent with acute kidney injury, grossly positive urinalysis, elevated BNP.  Radiograph most consistent with underlying cancer.  Right upper quadrant ultrasound equivocal  eICU Interventions  Maintain broad-spectrum antibiotics with vancomycin , cefepime   Currently not on vasopressors, maintain midodrine. Hypotension resolved without intervention, consider small fluid boluses  Defer communication with family to the primary team-hospital service is primary  DNR/DNI noted  DVT prophylaxis with heparin  GI prophylaxis not indicated     Intervention Category Intermediate Interventions: Hypotension - evaluation and management  Sandy Blouch 08/13/2024, 9:48 PM

## 2024-08-13 NOTE — Significant Event (Incomplete)
       CROSS COVER NOTE  NAME: Brittany Marks MRN: [id number] DOB : [birth year] ATTENDING PHYSICIAN: Arthea Child, MD    Date of Service   08/13/2024   HPI/Events of Note   [facility] Cross Cover at [facility]: Message received from nurse via secure chat Hey, I had asked E-link about her BP being low. I never got orders and now they are saying she is a [facility] pt.   HPI Patietn 83 year old female with widely metastatic lunng cancer presented to oncology office for treatment and sent to hospital secondary to hypotension and abnormal lab work (transaminitis and elevated creatinine).  Blood pressure initially improved with fluids Today patient was given more fluids and a dose of midodrine for BP support. Total IV fluids given over 4 L in 24 hours. BMP ove 4000.  ECHO  in march of this year notable for HFrEF 40-45% , grade II diastolic dysfunction and LVH with some LV hypokinesis    Interventions   Assessment/Plan: See flowsheet for vitals trend Patient is frail without acute distress. Alert and oriented x 4. Review of problems with her blood pressure kidney and liver function as to her reason for admission to hospital provided. Patient stated she has not been eating and drinking well due to quality of food substances provided. Currently requesting taco and a coke  Vbg shows partially compensated chronic metabolic acidosis Creatinine and transaminases have improved since admission   Oral sodium bicarb 650 mg TID 6 doses for renal support Midodrine dose stat and start TID in am Albumin 25 g of 25% x 2 doses 250 ns bolus x1 Initiate levophed to goal map of 65 if no improvement  AM cortisol level ordered Daughter [name] updated via phone 11/7 at 0640         Erminio LITTIE Cone NP [facility] Cross Cover 7pm-7am - check amion for availability Pager [phone]

## 2024-08-13 NOTE — ED Notes (Signed)
 Pts daughter would like someone to call her with an update ASAP

## 2024-08-13 NOTE — Consult Note (Addendum)
 NAME:  Brittany Marks, MRN:  [id number], DOB:  Mar 04, 1941, LOS: 0 ADMISSION DATE:  08/13/2024, CONSULTATION DATE:  08/13/24 REFERRING MD:  Arthea , CHIEF COMPLAINT:  abnormal labs    History of Present Illness:  83 yo hx metastatic small cell lung ca, COPD w chronic hypoxia, CVA, HFrEF who presented to ED from onc clinic for evaluation of elevated LFTs.  In ED, fluctuating soft Bps. Started on abx Admitted trh and pccm consulted in this setting  Pt feels well. Overall kind of tired but is c/w baseline. No fevers chills cough SOB chest pain abd pain n/v/d dysuria. Says she was pretty surprised Dr Montez referred her to the ED because she feels fine    Pertinent  Medical History  Lung cancer CVA HFrEF COPD on O2   Significant Hospital Events: Including procedures, antibiotic start and stop dates in addition to other pertinent events   11/6 to ED from onc office   Interim History / Subjective:  BP improved   Objective    Blood pressure (!) 79/47, pulse 84, temperature (!) 97.5 F (36.4 C), temperature source Oral, resp. rate 14, SpO2 98%.        Intake/Output Summary (Last 24 hours) at 08/13/2024 1809 Last data filed at 08/13/2024 1743 Gross per 24 hour  Intake 4236.66 ml  Output --  Net 4236.66 ml   There were no vitals filed for this visit.  Examination: General: chronically ill elderly F NAD  HENT: NCAT pink mm Lungs: CTAb  Cardiovascular: rrr s1s2 Abdomen: soft ndnt  Extremities: no obvious acute joint deformity  Neuro: Awake alert oriented  GU: defer  Resolved problem list   Assessment and Plan   Hypotension, labile Bps  -LA was 1.7 which is somewhat assuring against a true shock state P -ok for TRH to admit, would rec SDU -does not need pressors at present; cycled BP x 2 in pts room with MAPs 70s  -can repeat LA  -w hx HFrEF and currently elevated BNP, sending a coox -echo -infectious workup and broad abx   AKI Elevated LFTs  -per primary    HFrEF, possible AoC Mild RV dysfunction -elevated BNP noted P -echo and coox   Chronic hypoxic resp failure -O2 for goal > 88  -PRN bronchodilators   Extensive stage small cell lung cancer  -follow w dr sherrod  DNR/I -in review of several prior presentations, DNR/I. Admitting team plannng for full code as they were unable to verify this P -I personally verified this with her daughter hcpoa 08/13/24 and have entered her code status as DNR/I -before she is discharged, I would recommend a MOST form to be completed by primary team   GOC FTT Malnutrition -in my talk w daughter she shares that pt has had progressive decline since her ortho injury in march. Not very participatory, poor appetite and intake, lethargic and somewhat apathetic. Daughter says that in the past a palliative provider had recommended hospice. I think it would be very reasonable to consult palliative care and will place this.    Labs   CBC: Recent Labs  Lab 08/13/24 1230 08/13/24 1442  WBC 11.3* 13.0*  NEUTROABS 8.5*  --   HGB 9.2* 9.2*  HCT 26.0* 28.3*  MCV 81.5 87.9  PLT 309 289    Basic Metabolic Panel: Recent Labs  Lab 08/13/24 1230 08/13/24 1442  NA 133* 133*  K 4.5 4.7  CL 105 103  CO2 19* 15*  GLUCOSE 96 108*  BUN  38* 36*  CREATININE 1.91* 1.99*  CALCIUM  8.5* 8.8*   GFR: Estimated Creatinine Clearance: 17.7 mL/min (A) (by C-G formula based on SCr of 1.99 mg/dL (H)). Recent Labs  Lab 08/13/24 1230 08/13/24 1442 08/13/24 1523  WBC 11.3* 13.0*  --   LATICACIDVEN  --   --  1.7    Liver Function Tests: Recent Labs  Lab 08/13/24 1230 08/13/24 1442  AST 314* 386*  ALT 164* 203*  ALKPHOS 494* 595*  BILITOT 1.8* 1.6*  PROT 6.8 7.3  ALBUMIN 2.7* 2.9*   No results for input(s): LIPASE, AMYLASE in the last 168 hours. No results for input(s): AMMONIA in the last 168 hours.  ABG    Component Value Date/Time   HCO3 21.9 03/19/2024 0650   TCO2 25 12/07/2023 1605    ACIDBASEDEF 2.8 (H) 03/19/2024 0650   O2SAT 69.7 03/19/2024 0650     Coagulation Profile: Recent Labs  Lab 08/13/24 1506  INR 1.2    Cardiac Enzymes: No results for input(s): CKTOTAL, CKMB, CKMBINDEX, TROPONINI in the last 168 hours.  HbA1C: Hgb A1c MFr Bld  Date/Time Value Ref Range Status  03/19/2024 04:02 AM 5.4 4.8 - 5.6 % Final    Comment:    (NOTE)         Prediabetes: 5.7 - 6.4         Diabetes: >6.4         Glycemic control for adults with diabetes: <7.0   12/07/2023 04:26 PM 5.9 (H) 4.8 - 5.6 % Final    Comment:    (NOTE) Pre diabetes:          5.7%-6.4%  Diabetes:              >6.4%  Glycemic control for   <7.0% adults with diabetes     CBG: No results for input(s): GLUCAP in the last 168 hours.  Review of Systems:   She denies acute complaints   Past Medical History:  She,  has a past medical history of Acute CVA (cerebrovascular accident) (HCC) (12/07/2023), Cataract, Constipation, Left rotator cuff tear, Right rotator cuff tear, and SCL CA (dx'd 11/2018).   Surgical History:   Past Surgical History:  Procedure Laterality Date   CATARACT EXTRACTION W/ INTRAOCULAR LENS  IMPLANT, BILATERAL  2012   DECOMPRESSIVE LUMBAR LAMINECTOMY LEVEL 2 N/A 07/29/2013   Procedure: LUMBAR LAMINECTOMY, DECOMPRESSION LUMBAR THREE TO FOUR, FOUR TO FIVE microdiscectomy l3,4 right;  Surgeon: Tanda DELENA Heading, MD;  Location: [facility];  Service: Orthopedics;  Laterality: N/A;   I & D SUPERIOR RIGHT SHOULDER AND CLOSURE WOUND  01-10-2011   S/P ROTATOR CUFF REPAIR   IR IMAGING GUIDED PORT INSERTION  04/09/2019   LUMBAR LAMINECTOMY  1970'S   LUMBAR LAMINECTOMY/DECOMPRESSION MICRODISCECTOMY N/A 06/27/2016   Procedure: L1 - L2 DISCECTOMY;  Surgeon: Donaciano Sprang, MD;  Location: [facility] OR;  Service: Orthopedics;  Laterality: N/A;   LUMBAR SPINE SURGERY  1983   REVERSE SHOULDER ARTHROPLASTY Left 03/10/2020   Procedure: REVERSE SHOULDER ARTHROPLASTY;  Surgeon: Melita Drivers, MD;   Location: [facility];  Service: Orthopedics;  Laterality: Left;    RIGHT SHOULDER ARTHROSCOPY/ OPEN DISTAL CLAVICLE RESECTION/ SAD/ OPEN ROTATOR CUFF REPAIR  11-28-2010   SHOULDER FUSION SURGERY  03/11/2020   Shoulder Surgery , hardware placed   SHOULDER OPEN ROTATOR CUFF REPAIR  12/19/2011   Procedure: ROTATOR CUFF REPAIR SHOULDER OPEN;  Surgeon: Lynwood SHAUNNA Bern, MD;  Location: [facility];  Service: Orthopedics;  Laterality: Right;  RIGHT RECURRENT OPEN REPAIR OF THE ROTATOR CUFF WITH TISSUE MEND GRAFTANTERIOR CHROMIOECTOMY   VAGINAL HYSTERECTOMY  1979     Social History:   reports that she has been smoking cigarettes. She has a 25 pack-year smoking history. She has never used smokeless tobacco. She reports current alcohol  use. She reports that she does not use drugs.   Family History:  Her family history includes Congestive Heart Failure in her father and mother.   Allergies Allergies  Allergen Reactions   Tape Other (See Comments)    SKIN IS DELICATE!!   Oxycodone  Other (See Comments)    Unsteady gait, fall, altered mental status     Home Medications  Prior to Admission medications   Medication Sig Start Date End Date Taking? Authorizing Provider  acetaminophen  (TYLENOL ) 325 MG tablet Take 2 tablets (650 mg total) by mouth every 4 (four) hours as needed for mild pain (pain score 1-3) (or temp > 37.5 C (99.5 F)). 12/11/23   [place], Camellia PARAS, DO  albuterol  (VENTOLIN  HFA) 108 (90 Base) MCG/ACT inhaler Inhale 2 puffs into the lungs every 6 (six) hours as needed for wheezing or shortness of breath. 08/07/22   Heilingoetter, Cassandra L, PA-C  ascorbic acid (VITAMIN C) 500 MG tablet Take 500 mg by mouth daily.    [provider]  aspirin  EC 81 MG tablet Take 1 tablet (81 mg total) by mouth daily. Swallow whole. 12/12/23   [place], Camellia PARAS, DO  atorvastatin  (LIPITOR) 40 MG tablet Take 1 tablet (40 mg total) by mouth daily. Patient taking differently: Take 40 mg by  mouth at bedtime. 12/12/23   [place], Camellia PARAS, DO  b complex vitamins tablet Take 1 tablet by mouth daily with breakfast.    [provider]  durvalumab  1,500 mg in sodium chloride  0.9 % 100 mL Inject 1,500 mg into the vein every 28 (twenty-eight) days.    [provider]  gabapentin  (NEURONTIN ) 100 MG capsule Take 1 capsule (100 mg total) by mouth 2 (two) times daily. 12/03/23   Sherrod Sherrod, MD  ibuprofen (ADVIL) 600 MG tablet Take 600 mg by mouth every 12 (twelve) hours as needed (for pain or headaches). 03/17/24   [provider]  methylPREDNISolone  (MEDROL  DOSEPAK) 4 MG TBPK tablet Use as instructed 08/13/24   Sherrod Sherrod, MD  Multiple Vitamin (MULTIVITAMIN) tablet Take 1 tablet by mouth daily with breakfast.    [provider]  nicotine  (NICODERM CQ  - DOSED IN MG/24 HOURS) 14 mg/24hr patch Place 14 mg onto the skin daily.    [provider]  nystatin  (MYCOSTATIN /NYSTOP ) powder Apply topically 3 (three) times daily. 05/29/24   Vernon Ranks, MD  ondansetron  (ZOFRAN ) 4 MG tablet Take 1 tablet (4 mg total) by mouth every 8 (eight) hours as needed for nausea or vomiting. 03/11/20   Shuford, Randine, PA-C  oxymetazoline (AFRIN) 0.05 % nasal spray Place 2 sprays into both nostrils 2 (two) times daily.    [provider]  senna-docusate (SENOKOT-S) 8.6-50 MG tablet Take 1 tablet by mouth at bedtime as needed for mild constipation. Patient taking differently: Take 1 tablet by mouth daily as needed for mild constipation. 12/11/23   [place], Eric J, DO     Critical care time: n/a    High mdm  Ronnald Gave MSN, AGACNP-BC [facility] Pulmonary/Critical Care Medicine Amion for pager  08/13/2024, 6:29 PM

## 2024-08-13 NOTE — Progress Notes (Signed)
 Pharmacy Antibiotic Note  NIKIA LEVELS is a 83 y.o. female admitted on 08/13/2024 from oncology appointment. Labs notable for elevated liver enzymes and increased SCr compared to recent labs. Patient hypotensive on transfer to the ED. Pharmacy has been consulted for cefepime  and vancomycin  dosing for sepsis.  Plan: -Vanc 1 g followed by 750 mg IV q48hr -Cefepime  2 g IV q24hr     Temp (24hrs), Avg:97.6 F (36.4 C), Min:97.4 F (36.3 C), Max:97.8 F (36.6 C)  Recent Labs  Lab 08/13/24 1230 08/13/24 1442 08/13/24 1523  WBC 11.3* 13.0*  --   CREATININE 1.91* 1.99*  --   LATICACIDVEN  --   --  1.7    Estimated Creatinine Clearance: 17.7 mL/min (A) (by C-G formula based on SCr of 1.99 mg/dL (H)).    Allergies  Allergen Reactions   Tape Other (See Comments)    SKIN IS DELICATE!!   Oxycodone  Other (See Comments)    Unsteady gait, fall, altered mental status    Antimicrobials this admission: Cefepime  11/6 >> Vancomycin  11/6 >> Metronidazole 11/6 x 1  Dose adjustments this admission: NA  Microbiology results: 11/6 BCx: pending 11/6 UCx: pending   Thank you for allowing pharmacy to be a part of this patient's care.  Stefano MARLA Bologna, PharmD, BCPS Clinical Pharmacist 08/13/2024 5:59 PM

## 2024-08-13 NOTE — ED Triage Notes (Addendum)
 Pt BIB RN from cancer center due to abnormal liver and kidney labs. Pt is a poor historian and is unsure why she is here.

## 2024-08-13 NOTE — Telephone Encounter (Signed)
 Called Brittany Marks administrator [place] facility and informed her that pt. was sent to emergency department per Dr. Sherrod due to elevated liver enzymes and serum creatine. Stated she will notify nursing staff.

## 2024-08-13 NOTE — H&P (Addendum)
 History and Physical    Patient: Brittany Marks FMW:[id number] DOB: 1941/08/26 DOA: 08/13/2024 DOS: the patient was seen and examined on 08/13/2024 PCP: Pcp, No  Patient coming from: ALF/ILF  Chief Complaint:  Chief Complaint  Patient presents with   Hypotension   HPI: Brittany Marks is a 83 y.o. 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 The patient had a routine scheduled appointment with the oncology clinic.  Routine blood work noted that her liver enzymes were elevated.  The blood work also noted that her creatinine was 1.912 months ago it was 0.65.  Because of this the patient was sent from clinic to the emergency department.  In the emergency department the patient was noted to have a low blood pressure of initially 74/55.  She was given IV fluids and treated as if this could be sepsis.  She received her full sepsis dose IV fluids and antibiotics.  Her blood pressure improved.  The hospitalist were called to admit the patient to the hospital.  Those sepsis was an initial concern further workup revealed normal lactic acid no elevation of her white blood cell count which is chronically elevated.  And no clinical sign of infection.  The patient is not short of breath. She is on her baseline O2 2.5 L nasal cannula.  Her chest x-ray was unremarkable except for the known left upper lobe cavitary lung lesion.  Patient will be admitted to the hospitalist service for further workup and management.   Review of Systems: unable to review all systems due to the inability of the patient to answer questions. Past Medical  History:  Diagnosis Date   Acute CVA (cerebrovascular accident) (HCC) 12/07/2023   Cataract    Bilateral   Constipation    pain medication   Left rotator cuff tear    Right rotator cuff tear    SCL CA dx'd 11/2018   Past Surgical History:  Procedure Laterality Date   CATARACT EXTRACTION W/ INTRAOCULAR LENS  IMPLANT, BILATERAL  2012   DECOMPRESSIVE LUMBAR LAMINECTOMY LEVEL 2 N/A 07/29/2013   Procedure: LUMBAR LAMINECTOMY, DECOMPRESSION LUMBAR THREE TO FOUR, FOUR TO FIVE microdiscectomy l3,4 right;  Surgeon: Tanda DELENA Heading, MD;  Location: [facility];  Service: Orthopedics;  Laterality: N/A;   I & D SUPERIOR RIGHT SHOULDER AND CLOSURE WOUND  01-10-2011   S/P ROTATOR CUFF REPAIR   IR IMAGING GUIDED PORT INSERTION  04/09/2019   LUMBAR LAMINECTOMY  1970'S   LUMBAR LAMINECTOMY/DECOMPRESSION MICRODISCECTOMY N/A 06/27/2016   Procedure: L1 - L2 DISCECTOMY;  Surgeon: Donaciano Sprang, MD;  Location: [facility];  Service: Orthopedics;  Laterality: N/A;   LUMBAR SPINE SURGERY  1983   REVERSE SHOULDER ARTHROPLASTY Left 03/10/2020   Procedure: REVERSE SHOULDER ARTHROPLASTY;  Surgeon: Melita Drivers, MD;  Location: [facility];  Service: Orthopedics;  Laterality: Left;    RIGHT SHOULDER ARTHROSCOPY/ OPEN DISTAL CLAVICLE RESECTION/ SAD/ OPEN ROTATOR CUFF REPAIR  11-28-2010   SHOULDER FUSION SURGERY  03/11/2020   Shoulder Surgery , hardware placed   SHOULDER OPEN ROTATOR CUFF REPAIR  12/19/2011   Procedure: ROTATOR CUFF REPAIR SHOULDER OPEN;  Surgeon: Lynwood SHAUNNA Bern, MD;  Location: [facility];  Service: Orthopedics;  Laterality: Right;  RIGHT RECURRENT OPEN REPAIR OF THE ROTATOR CUFF WITH TISSUE MEND GRAFTANTERIOR CHROMIOECTOMY   VAGINAL HYSTERECTOMY  1979   Social History:  reports that she has been smoking cigarettes. She has a 25 pack-year smoking history. She has never used smokeless tobacco. She reports current alcohol  use. She reports that she does not use drugs.  Allergies  Allergen  Reactions   Tape Other (See Comments)    SKIN IS DELICATE!!   Oxycodone  Other (See Comments)    Unsteady gait, fall, altered mental status    Family History  Problem Relation Age of Onset   Congestive Heart Failure Mother    Congestive Heart Failure Father     Prior to Admission medications   Medication Sig Start Date End Date Taking? Authorizing Provider  acetaminophen  (TYLENOL ) 325 MG tablet Take 2 tablets (650 mg total) by mouth every 4 (four) hours as needed for mild pain (pain score 1-3) (or temp > 37.5 C (99.5 F)). 12/11/23   [place], Camellia PARAS, DO  albuterol  (VENTOLIN  HFA) 108 (90 Base) MCG/ACT inhaler Inhale 2 puffs into the lungs every 6 (six) hours as needed for wheezing or shortness of breath. 08/07/22   Heilingoetter, Cassandra L, PA-C  ascorbic acid (VITAMIN C) 500 MG tablet Take 500 mg by mouth daily.    [provider]  aspirin  EC 81 MG tablet Take 1 tablet (81 mg total) by mouth daily. Swallow whole. 12/12/23   [place], Camellia PARAS, DO  atorvastatin  (LIPITOR) 40 MG tablet Take 1 tablet (40 mg total) by mouth daily. Patient taking differently: Take 40 mg by mouth at bedtime. 12/12/23   [place], Camellia PARAS, DO  b complex vitamins tablet Take 1 tablet by mouth daily with breakfast.    [provider]  durvalumab  1,500 mg in sodium chloride  0.9 % 100 mL Inject 1,500 mg into the vein every 28 (twenty-eight) days.    [provider]  gabapentin  (NEURONTIN ) 100 MG capsule Take 1 capsule (100 mg total) by mouth 2 (two) times daily. 12/03/23   Sherrod Sherrod, MD  ibuprofen (ADVIL) 600 MG tablet Take 600 mg by mouth every 12 (twelve) hours as needed (for pain or headaches). 03/17/24   [provider]  methylPREDNISolone  (MEDROL  DOSEPAK) 4 MG TBPK tablet Use as instructed 08/13/24   Sherrod Sherrod, MD  Multiple Vitamin (MULTIVITAMIN) tablet Take 1 tablet by mouth daily with breakfast.    [provider]  nicotine  (NICODERM CQ  - DOSED IN MG/24 HOURS) 14  mg/24hr patch Place 14 mg onto the skin daily.    [provider]  nystatin  (MYCOSTATIN /NYSTOP ) powder Apply topically 3 (three) times daily. 05/29/24   Vernon Ranks, MD  ondansetron  (ZOFRAN ) 4 MG tablet Take 1 tablet (4 mg total) by mouth every 8 (eight) hours as needed for nausea or vomiting. 03/11/20   Shuford, Randine, PA-C  oxymetazoline (AFRIN) 0.05 % nasal spray Place 2 sprays into both nostrils 2 (two) times daily.    [provider]  senna-docusate (SENOKOT-S) 8.6-50 MG tablet Take 1 tablet by mouth at bedtime as needed for mild constipation. Patient taking differently: Take 1 tablet by mouth daily as needed for mild constipation. 12/11/23   [place], Eric J, DO    Physical Exam: Vitals:   08/13/24 1423 08/13/24 1442 08/13/24 1518  BP: (!) 74/55 (!) 95/51 (!) 153/91  Pulse: 92 92 82  Resp: 16 16 17   Temp: 97.8 F (36.6 C)  (!) 97.5 F (36.4 C)  TempSrc: Oral  Oral  SpO2: 98% 99% 97%   Physical Exam:  General: No acute distress, frail, chronically ill, pale HEENT: Normocephalic, atraumatic, PERRL Cardiovascular: Normal rate and rhythm. Distal pulses intact. Pulmonary: Normal pulmonary effort, Rales bilaterally Gastrointestinal: Nondistended abdomen, soft, non-tender, normoactive bowel sounds Musculoskeletal:No lower ext edema Lymphadenopathy: No cervical LAD. Skin: Skin is warm and dry. Neuro: No focal deficits noted, AAOx3.  She was oriented to name, Darryle Law and 2025.  She missed the month only by a couple of days. PSYCH: Attentive and cooperative  Data Reviewed:  Results for orders placed or performed during the hospital encounter of 08/13/24 (from the past 24 hours)  CBC     Status: Abnormal   Collection Time: 08/13/24  2:42 PM  Result Value Ref Range   WBC 13.0 (H) 4.0 - 10.5 K/uL   RBC 3.22 (L) 3.87 - 5.11 MIL/uL   Hemoglobin 9.2 (L) 12.0 - 15.0 g/dL   HCT 71.6 (L) 63.9 - 53.9 %   MCV 87.9 80.0 - 100.0 fL   MCH 28.6 26.0 - 34.0 pg   MCHC 32.5  30.0 - 36.0 g/dL   RDW 83.5 (H) 88.4 - 84.4 %   Platelets 289 150 - 400 K/uL   nRBC 0.0 0.0 - 0.2 %  Comprehensive metabolic panel     Status: Abnormal   Collection Time: 08/13/24  2:42 PM  Result Value Ref Range   Sodium 133 (L) 135 - 145 mmol/L   Potassium 4.7 3.5 - 5.1 mmol/L   Chloride 103 98 - 111 mmol/L   CO2 15 (L) 22 - 32 mmol/L   Glucose, Bld 108 (H) 70 - 99 mg/dL   BUN 36 (H) 8 - 23 mg/dL   Creatinine, Ser 8.00 (H) 0.44 - 1.00 mg/dL   Calcium  8.8 (L) 8.9 - 10.3 mg/dL   Total Protein 7.3 6.5 - 8.1 g/dL   Albumin 2.9 (L) 3.5 - 5.0 g/dL   AST 613 (H) 15 - 41 U/L   ALT 203 (H) 0 - 44 U/L   Alkaline Phosphatase 595 (H) 38 - 126 U/L   Total Bilirubin 1.6 (H) 0.0 - 1.2 mg/dL   GFR, Estimated 24 (L) >60 mL/min   Anion gap 15 5 - 15  Protime-INR     Status: Abnormal   Collection Time: 08/13/24  3:06 PM  Result Value Ref Range   Prothrombin Time 15.4 (H) 11.4 - 15.2 seconds   INR 1.2 0.8 - 1.2  I-Stat Lactic Acid, ED     Status: None   Collection Time: 08/13/24  3:23 PM  Result Value Ref Range   Lactic Acid, Venous 1.7 0.5 - 1.9 mmol/L  Urinalysis, w/ Reflex to Culture (Infection Suspected) -Urine, Catheterized     Status: Abnormal   Collection Time: 08/13/24  4:10 PM  Result Value Ref Range   Specimen Source URINE, CATHETERIZED    Color, Urine AMBER (A) YELLOW   APPearance CLOUDY (A) CLEAR   Specific Gravity, Urine 1.013 1.005 - 1.030   pH 5.0 5.0 - 8.0   Glucose, UA NEGATIVE NEGATIVE mg/dL   Hgb urine dipstick SMALL (A) NEGATIVE   Bilirubin Urine NEGATIVE NEGATIVE   Ketones, ur NEGATIVE NEGATIVE mg/dL   Protein, ur 30 (A) NEGATIVE mg/dL   Nitrite NEGATIVE NEGATIVE   Leukocytes,Ua LARGE (A) NEGATIVE   RBC / HPF 11-20 0 - 5 RBC/hpf   WBC, UA >50 0 - 5 WBC/hpf   Bacteria, UA RARE (A) NONE SEEN  Squamous Epithelial / HPF 0-5 0 - 5 /HPF   WBC Clumps PRESENT    Hyaline Casts, UA PRESENT    Non Squamous Epithelial 11-20 (A) NONE SEEN     Assessment and  Plan: Hypotension AKI - Hypovolemia was felt to be the cause of the patient's AKI and low blood pressure.  Her lactic acid is normal.  She is afebrile.  Her white blood cell count is close to its baseline.  It is always slightly elevated.  She was given empiric antibiotics on arrival in the emergency department in case this was sepsis, but no source of infection has been found.  She did receive full sepsis dose fluids.  She received over 3 L of IV fluids by the time of my evaluation.  She had 1 blood pressure of 153/91 it appeared that the IV fluids had resolved her low blood pressures, but her next blood pressure 2 hours later was 86/53.   The patient denies shortness of breath.  Her O2 sats are good on her baseline O2. -The patient is developing crackles on her lung exam so we will decrease the rate of her IV fluids. As her blood pressure remains low she may have to be admitted to the stepdown bed or even ICU.  Consider a CT scan of her chest to evaluate for PE. She likely cannot have contrast because of her AKI at this time. She does not have elevated JVD or any sign of tamponade.  Her chest x-ray revealed normal cardiac size. - If her blood pressure does not improve, will need to consider pressors. - Management ongoing. - Will continue Vanco and Maxipime  for now.  3.  Elevated transaminases -oncology has recommended repeat scan of her abdomen to evaluate for possible progression of cancer versus side effect of immunotherapy. -Medrol  Dosepak for 1 week has been recommended - Will order   4. Pain in sacral area - Will examine and evaluated or pressure ulcer once bp is stable.   Advance Care Planning:   Code Status: Prior  The patient does not have capacity for discussion about advance directives and she does not have any documents loaded in to the chart but I do notice that she has been DNR at her last several hospitalizations.  Will need to contact the patient's healthcare power of attorney to  verify her CODE STATUS.  She will be full code in the meantime. The patient says her daughter is her surrogate management consultant.  Consults: None  Family Communication: None  Severity of Illness: The appropriate patient status for this patient is INPATIENT. Inpatient status is judged to be reasonable and necessary in order to provide the required intensity of service to ensure the patient's safety. The patient's presenting symptoms, physical exam findings, and initial radiographic and laboratory data in the context of their chronic comorbidities is felt to place them at high risk for further clinical deterioration. Furthermore, it is not anticipated that the patient will be medically stable for discharge from the hospital within 2 midnights of admission.   * I certify that at the point of admission it is my clinical judgment that the patient will require inpatient hospital care spanning beyond 2 midnights from the point of admission due to high intensity of service, high risk for further deterioration and high frequency of surveillance required.*  Author: ARTHEA CHILD, MD 08/13/2024 4:42 PM  For on call review [email or web address].

## 2024-08-13 NOTE — Telephone Encounter (Signed)
 CRITICAL VALUE STICKER  CRITICAL VALUE: AST [id number]  RECEIVER (on-site recipient of call): [facility]  DATE & TIME NOTIFIED: 08/13/24 @ 131  MESSENGER (representative from lab): Heather  MD NOTIFIED: Dr. Sherrod  TIME OF NOTIFICATION: 132  RESPONSE: patient has appt with Dr. Sherrod today

## 2024-08-13 NOTE — Progress Notes (Addendum)
 Hospitalist cross cover  I spoke to the patient's daughter [place].  She lives in [place] .  She reports that the mother has been in and out of the hospital quite a bit this year and she has been back up to [place] several times but she is in [place]  at this moment.  I updated her that we have not found a clear reason for the patient's low blood pressure but the fact that it has continued despite IV fluids is extremely concerning.  I explained that I was giving empiric IV antibiotics and how I had to slow down on the fluids because of the crackling in her lungs.  I also explained that the patient is critically ill at this time and we cannot tell how she is going to do.  The daughter reported that the patient has been going downhill all year.  She tells me that palliative care saw the patient as an outpatient 10 days ago and recommended hospice in part because the patient sleeps most of the day and does not get out of bed much.  - She requests that the doctor call her daily with updates. - Also she would like close family friend Consuelo Batty to have full clearance to visit the patient. - PCCM is now following - Midodrine ordered. - Levophed was ordered as needed also but that order must have been discontinued by PCCM.

## 2024-08-13 NOTE — ED Notes (Signed)
Only able to get 1 blue bottle for cultures

## 2024-08-13 NOTE — Progress Notes (Signed)
 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   PRIOR THERAPY: None   CURRENT THERAPY: Palliative systemic chemotherapy with carboplatin  for AUC of 5 on day 1, etoposide  100 mg/M2 on days 1, 2 and 3 as well as Imfinzi  1500 mg every 3 weeks with the chemotherapy and Neulasta  support. First dose on 03/16/2019. Status post 69 cycles.   Starting from cycle #5 the patient will be treated with maintenance treatment with Imfinzi  1500 mg IV every 4 weeks.  INTERVAL HISTORY: Brittany Marks 83 y.o. female returns to clinic today for follow-up visit. Discussed the use of AI scribe software for clinical note transcription with the patient, who gave verbal consent to proceed.  History of Present Illness Brittany Marks is an 83 year old female with extensive stage small cell lung cancer who presents for evaluation before starting cycle number seventy of maintenance treatment with durvalumab .  She was diagnosed with extensive stage small cell lung cancer in May 2020. Initially, she received palliative systemic chemo-immunotherapy with carboplatin , etoposide , and durvalumab  every three weeks for four cycles. Since then, she has been on maintenance treatment with durvalumab  every four weeks. She is currently post a total of sixty-nine cycles.  She resides at [facility], where she does not participate in physical therapy. Meals are brought to her room, and she does not socialize with others at the facility.  No pain, nausea, vomiting, diarrhea, or changes in breathing. She has been on oxygen  for a while, with  no recent changes in her oxygen  needs.    MEDICAL HISTORY: Past Medical History:  Diagnosis Date   Acute CVA (cerebrovascular accident) (HCC) 12/07/2023   Cataract    Bilateral   Constipation    pain medication   Left rotator cuff tear    Right rotator cuff tear    SCL CA dx'd 11/2018    ALLERGIES:  is allergic to tape and oxycodone .  MEDICATIONS:  Current Outpatient Medications  Medication Sig Dispense Refill   acetaminophen  (TYLENOL ) 325 MG tablet Take 2 tablets (650 mg total) by mouth every 4 (four) hours as needed for mild pain (pain score 1-3) (or temp > 37.5 C (99.5 F)).     albuterol  (VENTOLIN  HFA) 108 (90 Base) MCG/ACT inhaler Inhale 2 puffs into the lungs every 6 (six) hours as needed for wheezing or shortness of breath. 8 g 2   ascorbic acid (VITAMIN C) 500 MG tablet Take 500 mg by mouth daily.     aspirin  EC 81 MG tablet Take 1 tablet (81 mg total) by mouth daily. Swallow whole.     atorvastatin  (LIPITOR) 40 MG tablet Take 1 tablet (40 mg total) by mouth daily. (Patient taking differently: Take 40 mg by mouth at bedtime.)     b complex vitamins tablet Take 1 tablet by mouth daily with breakfast.     durvalumab  1,500 mg in sodium chloride  0.9 % 100 mL Inject 1,500 mg into the vein every 28 (twenty-eight) days.     gabapentin  (NEURONTIN ) 100 MG capsule Take 1 capsule (100 mg total) by mouth 2 (two) times daily. 90 capsule 2  ibuprofen (ADVIL) 600 MG tablet Take 600 mg by mouth every 12 (twelve) hours as needed (for pain or headaches).     Multiple Vitamin (MULTIVITAMIN) tablet Take 1 tablet by mouth daily with breakfast.     nicotine  (NICODERM CQ  - DOSED IN MG/24 HOURS) 14 mg/24hr patch Place 14 mg onto the skin daily.     nystatin  (MYCOSTATIN /NYSTOP ) powder Apply topically 3 (three) times daily. 15 g 0   ondansetron  (ZOFRAN ) 4 MG tablet Take 1 tablet (4 mg total) by mouth every 8 (eight) hours as needed for nausea or vomiting. 10 tablet 0   oxymetazoline (AFRIN) 0.05 %  nasal spray Place 2 sprays into both nostrils 2 (two) times daily.     senna-docusate (SENOKOT-S) 8.6-50 MG tablet Take 1 tablet by mouth at bedtime as needed for mild constipation. (Patient taking differently: Take 1 tablet by mouth daily as needed for mild constipation.)     No current facility-administered medications for this visit.   Facility-Administered Medications Ordered in Other Visits  Medication Dose Route Frequency Provider Last Rate Last Admin   heparin  lock flush 100 unit/mL  500 Units Intracatheter Once PRN Magrinat, Sandria BROCKS, MD       sodium chloride  flush (NS) 0.9 % injection 10 mL  10 mL Intracatheter PRN Magrinat, Sandria BROCKS, MD        SURGICAL HISTORY:  Past Surgical History:  Procedure Laterality Date   CATARACT EXTRACTION W/ INTRAOCULAR LENS  IMPLANT, BILATERAL  2012   DECOMPRESSIVE LUMBAR LAMINECTOMY LEVEL 2 N/A 07/29/2013   Procedure: LUMBAR LAMINECTOMY, DECOMPRESSION LUMBAR THREE TO FOUR, FOUR TO FIVE microdiscectomy l3,4 right;  Surgeon: Tanda DELENA Heading, MD;  Location: [facility];  Service: Orthopedics;  Laterality: N/A;   I & D SUPERIOR RIGHT SHOULDER AND CLOSURE WOUND  01-10-2011   S/P ROTATOR CUFF REPAIR   IR IMAGING GUIDED PORT INSERTION  04/09/2019   LUMBAR LAMINECTOMY  1970'S   LUMBAR LAMINECTOMY/DECOMPRESSION MICRODISCECTOMY N/A 06/27/2016   Procedure: L1 - L2 DISCECTOMY;  Surgeon: Donaciano Sprang, MD;  Location: [facility];  Service: Orthopedics;  Laterality: N/A;   LUMBAR SPINE SURGERY  1983   REVERSE SHOULDER ARTHROPLASTY Left 03/10/2020   Procedure: REVERSE SHOULDER ARTHROPLASTY;  Surgeon: Melita Drivers, MD;  Location: [facility];  Service: Orthopedics;  Laterality: Left;    RIGHT SHOULDER ARTHROSCOPY/ OPEN DISTAL CLAVICLE RESECTION/ SAD/ OPEN ROTATOR CUFF REPAIR  11-28-2010   SHOULDER FUSION SURGERY  03/11/2020   Shoulder Surgery , hardware placed   SHOULDER OPEN ROTATOR CUFF REPAIR  12/19/2011   Procedure: ROTATOR CUFF REPAIR SHOULDER OPEN;  Surgeon: Lynwood SHAUNNA Bern, MD;  Location:  SURGERY CENTER;  Service: Orthopedics;  Laterality: Right;  RIGHT RECURRENT OPEN REPAIR OF THE ROTATOR CUFF WITH TISSUE MEND GRAFTANTERIOR CHROMIOECTOMY   VAGINAL HYSTERECTOMY  1979    REVIEW OF SYSTEMS:  Constitutional: positive for fatigue Eyes: negative Ears, nose, mouth, throat, and face: negative Respiratory: positive for dyspnea on exertion Cardiovascular: negative Gastrointestinal: negative Genitourinary:negative Integument/breast: negative Hematologic/lymphatic: negative Musculoskeletal:positive for arthralgias and muscle weakness Neurological: negative Behavioral/Psych: negative Endocrine: negative Allergic/Immunologic: negative   PHYSICAL EXAMINATION: General appearance: alert, cooperative, fatigued, and no distress Head: Normocephalic, without obvious abnormality, atraumatic Neck: no adenopathy, no JVD, supple, symmetrical, trachea midline, and thyroid  not enlarged, symmetric, no tenderness/mass/nodules Lymph nodes: Cervical, supraclavicular, and axillary nodes normal. Resp: clear to auscultation bilaterally Back: symmetric, no curvature. ROM normal. No CVA tenderness. Cardio: regular rate and rhythm, S1, S2 normal, no murmur, click, rub or  gallop GI: soft, non-tender; bowel sounds normal; no masses,  no organomegaly Extremities: extremities normal, atraumatic, no cyanosis or edema Neurologic: Alert and oriented X 3, normal strength and tone. Normal symmetric reflexes. Normal coordination and gait   ECOG PERFORMANCE STATUS: 1 - Symptomatic but completely ambulatory  Blood pressure (!) 103/56, pulse 84, temperature (!) 97.4 F (36.3 C), temperature source Temporal, resp. rate 17, height 5' 3 (1.6 m), weight 116 lb (52.6 kg), SpO2 100%.  LABORATORY DATA: Lab Results  Component Value Date   WBC 11.3 (H) 08/13/2024   HGB 9.2 (L) 08/13/2024   HCT 26.0 (L) 08/13/2024   MCV 81.5 08/13/2024   PLT 309 08/13/2024      Chemistry       Component Value Date/Time   NA 138 06/16/2024 1019   K 3.5 06/16/2024 1019   CL 104 06/16/2024 1019   CO2 27 06/16/2024 1019   BUN 14 06/16/2024 1019   CREATININE 0.65 06/16/2024 1019      Component Value Date/Time   CALCIUM  9.4 06/16/2024 1019   ALKPHOS 183 (H) 06/16/2024 1019   AST 99 (H) 06/16/2024 1019   ALT 61 (H) 06/16/2024 1019   BILITOT 0.6 06/16/2024 1019       RADIOGRAPHIC STUDIES: No results found.      ASSESSMENT AND PLAN: This is a very pleasant 83 years old white female with extensive stage small cell lung cancer and she is currently undergoing treatment with carboplatin , etoposide  and Imfinzi  status post 5 cycles.  Starting from cycle #6 the patient is on maintenance treatment with single agent Imfinzi  every 4 weeks status post 69 cycles of total treatment. She has been tolerating this treatment fairly well except for increasing fatigue and weakness recently.  She is currently a resident of skilled nursing facility. Repeat blood work showed elevated liver enzymes suspicious to be related to immunotherapy mediated hepatitis versus disease progression in the liver. Assessment and Plan Assessment & Plan Extensive stage small cell lung cancer on maintenance immunotherapy Extensive stage small cell lung cancer diagnosed in May 2020. Currently on maintenance treatment with durvalumab  every four weeks. No new symptoms reported. Treatment delayed due to elevated liver enzymes. - Delayed durvalumab  treatment until liver enzymes normalize and scan results are reviewed.  Immune-related hepatitis with elevated liver enzymes Elevated liver enzymes likely secondary to immune therapy, indicating possible immune-related hepatitis. No symptoms of nausea, vomiting, or diarrhea reported. - Prescribed Medrol  Dosepak for one week. - Ordered liver scan to assess for liver pathology. - Will reassess liver enzymes after steroid treatment. She was advised to call immediately if she has  any concerning symptoms in the interval. Acute renal failure -Her serum creatinine 1.91. I will send the patient to the emergency department for further evaluation and probably admission for evaluation of the underlying cause of her renal and hepatic dysfunction.  The patient voices understanding of current disease status and treatment options and is in agreement with the current care plan.  All questions were answered. The patient knows to call the clinic with any problems, questions or concerns. We can certainly see the patient much sooner if necessary. The total time spent in the appointment was 30 minutes.  Disclaimer: This note was dictated with voice recognition software. Similar sounding words can inadvertently be transcribed and may not be corrected upon review.

## 2024-08-14 ENCOUNTER — Encounter (HOSPITAL_COMMUNITY): Payer: Self-pay | Admitting: Internal Medicine

## 2024-08-14 ENCOUNTER — Inpatient Hospital Stay (HOSPITAL_COMMUNITY)

## 2024-08-14 ENCOUNTER — Other Ambulatory Visit: Payer: Self-pay

## 2024-08-14 DIAGNOSIS — N179 Acute kidney failure, unspecified: Secondary | ICD-10-CM | POA: Diagnosis not present

## 2024-08-14 DIAGNOSIS — I5023 Acute on chronic systolic (congestive) heart failure: Secondary | ICD-10-CM | POA: Diagnosis not present

## 2024-08-14 DIAGNOSIS — R7401 Elevation of levels of liver transaminase levels: Secondary | ICD-10-CM

## 2024-08-14 DIAGNOSIS — I959 Hypotension, unspecified: Secondary | ICD-10-CM | POA: Diagnosis not present

## 2024-08-14 DIAGNOSIS — E8721 Acute metabolic acidosis: Secondary | ICD-10-CM

## 2024-08-14 LAB — CORTISOL: Cortisol, Plasma: 11.2 ug/dL

## 2024-08-14 LAB — COMPREHENSIVE METABOLIC PANEL WITH GFR
ALT: 152 U/L — ABNORMAL HIGH (ref 0–44)
AST: 276 U/L — ABNORMAL HIGH (ref 15–41)
Albumin: 2.7 g/dL — ABNORMAL LOW (ref 3.5–5.0)
Alkaline Phosphatase: 405 U/L — ABNORMAL HIGH (ref 38–126)
Anion gap: 7 (ref 5–15)
BUN: 33 mg/dL — ABNORMAL HIGH (ref 8–23)
CO2: 18 mmol/L — ABNORMAL LOW (ref 22–32)
Calcium: 8 mg/dL — ABNORMAL LOW (ref 8.9–10.3)
Chloride: 108 mmol/L (ref 98–111)
Creatinine, Ser: 1.59 mg/dL — ABNORMAL HIGH (ref 0.44–1.00)
GFR, Estimated: 32 mL/min — ABNORMAL LOW (ref >60)
Glucose, Bld: 100 mg/dL — ABNORMAL HIGH (ref 70–99)
Potassium: 3.8 mmol/L (ref 3.5–5.1)
Sodium: 134 mmol/L — ABNORMAL LOW (ref 135–145)
Total Bilirubin: 1.2 mg/dL (ref 0.0–1.2)
Total Protein: 5.8 g/dL — ABNORMAL LOW (ref 6.5–8.1)

## 2024-08-14 LAB — CBC
HCT: 22 % — ABNORMAL LOW (ref 36.0–46.0)
Hemoglobin: 7.3 g/dL — ABNORMAL LOW (ref 12.0–15.0)
MCH: 28.6 pg (ref 26.0–34.0)
MCHC: 33.2 g/dL (ref 30.0–36.0)
MCV: 86.3 fL (ref 80.0–100.0)
Platelets: 227 K/uL (ref 150–400)
RBC: 2.55 MIL/uL — ABNORMAL LOW (ref 3.87–5.11)
RDW: 16.7 % — ABNORMAL HIGH (ref 11.5–15.5)
WBC: 9.8 K/uL (ref 4.0–10.5)
nRBC: 0 % (ref 0.0–0.2)

## 2024-08-14 LAB — COOXEMETRY PANEL
Carboxyhemoglobin: 1.7 % — ABNORMAL HIGH (ref 0.5–1.5)
Methemoglobin: <0.7 % (ref 0.0–1.5)
O2 Saturation: 70.1 %
Total hemoglobin: 8.2 g/dL — ABNORMAL LOW (ref 12.0–16.0)

## 2024-08-14 LAB — ECHOCARDIOGRAM LIMITED
Area-P 1/2: 4.08 cm2
Height: 62 in
S' Lateral: 3.7 cm
Weight: 1703.71 [oz_av]

## 2024-08-14 LAB — GLUCOSE, CAPILLARY: Glucose-Capillary: 82 mg/dL (ref 70–99)

## 2024-08-14 LAB — LACTIC ACID, PLASMA
Lactic Acid, Venous: 0.5 mmol/L (ref 0.5–1.9)
Lactic Acid, Venous: 0.9 mmol/L (ref 0.5–1.9)

## 2024-08-14 LAB — TSH: TSH: 2.54 u[IU]/mL (ref 0.350–4.500)

## 2024-08-14 LAB — HEMOGLOBIN AND HEMATOCRIT, BLOOD
HCT: 23 % — ABNORMAL LOW (ref 36.0–46.0)
Hemoglobin: 7.6 g/dL — ABNORMAL LOW (ref 12.0–15.0)

## 2024-08-14 LAB — PRO BRAIN NATRIURETIC PEPTIDE: Pro Brain Natriuretic Peptide: 7491 pg/mL — ABNORMAL HIGH (ref <300.0)

## 2024-08-14 LAB — MAGNESIUM: Magnesium: 1.9 mg/dL (ref 1.7–2.4)

## 2024-08-14 MED ORDER — SODIUM CHLORIDE 0.9% FLUSH
10.0000 mL | Freq: Two times a day (BID) | INTRAVENOUS | Status: DC
  Administered 2024-08-14: 10 mL

## 2024-08-14 MED ORDER — METHYLPREDNISOLONE 4 MG PO TBPK: 4.0000 mg | ORAL_TABLET | ORAL | Status: DC

## 2024-08-14 MED ORDER — METHYLPREDNISOLONE 4 MG PO TBPK: 4.0000 mg | ORAL_TABLET | Freq: Four times a day (QID) | ORAL | Status: DC

## 2024-08-14 MED ORDER — INFLUENZA VAC SPLIT HIGH-DOSE 0.5 ML IM SUSY
0.5000 mL | PREFILLED_SYRINGE | INTRAMUSCULAR | Status: DC
  Filled 2024-08-14: qty 0.5

## 2024-08-14 MED ORDER — SODIUM CHLORIDE 0.9 % IV BOLUS
250.0000 mL | Freq: Once | INTRAVENOUS | Status: AC
  Administered 2024-08-14: 250 mL via INTRAVENOUS

## 2024-08-14 MED ORDER — METHYLPREDNISOLONE 4 MG PO TBPK: 8.0000 mg | ORAL_TABLET | Freq: Every evening | ORAL | Status: DC

## 2024-08-14 MED ORDER — ENSURE PLUS HIGH PROTEIN PO LIQD: 237.0000 mL | Freq: Two times a day (BID) | ORAL | Status: DC

## 2024-08-14 MED ORDER — METHYLPREDNISOLONE 4 MG PO TBPK
4.0000 mg | ORAL_TABLET | ORAL | Status: AC
  Administered 2024-08-14: 4 mg via ORAL

## 2024-08-14 MED ORDER — PIPERACILLIN-TAZOBACTAM 3.375 G IVPB
3.3750 g | Freq: Three times a day (TID) | INTRAVENOUS | Status: DC
  Administered 2024-08-14 – 2024-08-15 (×3): 3.375 g via INTRAVENOUS
  Filled 2024-08-14 (×4): qty 50

## 2024-08-14 MED ORDER — METHYLPREDNISOLONE 4 MG PO TBPK
4.0000 mg | ORAL_TABLET | Freq: Three times a day (TID) | ORAL | Status: DC
  Administered 2024-08-15: 4 mg via ORAL

## 2024-08-14 MED ORDER — ALBUMIN HUMAN 25 % IV SOLN
25.0000 g | Freq: Once | INTRAVENOUS | Status: AC
  Administered 2024-08-14: 25 g via INTRAVENOUS
  Filled 2024-08-14: qty 100

## 2024-08-14 MED ORDER — METHYLPREDNISOLONE 4 MG PO TBPK
8.0000 mg | ORAL_TABLET | Freq: Every morning | ORAL | Status: AC
  Administered 2024-08-14: 8 mg via ORAL
  Filled 2024-08-14: qty 21

## 2024-08-14 MED ORDER — POTASSIUM CHLORIDE CRYS ER 20 MEQ PO TBCR
40.0000 meq | EXTENDED_RELEASE_TABLET | Freq: Once | ORAL | Status: AC
  Administered 2024-08-14: 40 meq via ORAL
  Filled 2024-08-14: qty 2

## 2024-08-14 MED ORDER — MIDODRINE HCL 5 MG PO TABS
10.0000 mg | ORAL_TABLET | Freq: Three times a day (TID) | ORAL | Status: DC
  Administered 2024-08-14 – 2024-08-15 (×3): 10 mg via ORAL
  Filled 2024-08-14 (×4): qty 2

## 2024-08-14 MED ORDER — FUROSEMIDE 10 MG/ML IJ SOLN
20.0000 mg | Freq: Once | INTRAMUSCULAR | Status: AC
  Administered 2024-08-14: 20 mg via INTRAVENOUS
  Filled 2024-08-14: qty 2

## 2024-08-14 MED ORDER — MIDODRINE HCL 5 MG PO TABS
10.0000 mg | ORAL_TABLET | Freq: Once | ORAL | Status: AC
  Administered 2024-08-14: 10 mg via ORAL
  Filled 2024-08-14: qty 2

## 2024-08-14 MED ORDER — SODIUM CHLORIDE 0.9% FLUSH: 10.0000 mL | INTRAVENOUS | Status: DC | PRN

## 2024-08-14 MED ORDER — ORAL CARE MOUTH RINSE: 15.0000 mL | OROMUCOSAL | Status: DC | PRN

## 2024-08-14 MED ORDER — NOREPINEPHRINE 4 MG/250ML-% IV SOLN
0.0000 ug/min | INTRAVENOUS | Status: DC
  Administered 2024-08-14: 9 ug/min via INTRAVENOUS
  Administered 2024-08-14: 2 ug/min via INTRAVENOUS
  Administered 2024-08-14: 8 ug/min via INTRAVENOUS
  Administered 2024-08-15: 12 ug/min via INTRAVENOUS
  Administered 2024-08-15: 19 ug/min via INTRAVENOUS
  Filled 2024-08-14 (×5): qty 250

## 2024-08-14 NOTE — Progress Notes (Signed)
 NAME:  Brittany Marks, MRN:  [id number], DOB:  20-Aug-1941, LOS: 1 ADMISSION DATE:  08/13/2024, CONSULTATION DATE:  08/13/24 REFERRING MD:  Arthea , CHIEF COMPLAINT:  abnormal labs    History of Present Illness:  83 yo hx metastatic small cell lung ca, COPD w chronic hypoxia, CVA, HFrEF who presented to ED from onc clinic for evaluation of elevated LFTs.  In ED, fluctuating soft Bps. Started on abx Admitted trh and pccm consulted in this setting  Pt feels well. Overall kind of tired but is c/w baseline. No fevers chills cough SOB chest pain abd pain n/v/d dysuria. Says she was pretty surprised Dr Montez referred her to the ED because she feels fine    Pertinent  Medical History  Lung cancer CVA HFrEF COPD on O2   Significant Hospital Events: Including procedures, antibiotic start and stop dates in addition to other pertinent events   11/6 to ED from onc office  11/7 started on pressors, weaning.   Interim History / Subjective:  Was started on pressors overnight   Feels well, doesn't feel like she should be in the hospital Says her blood pressure just goes up and down and its fine   Objective    Blood pressure (!) 122/57, pulse 84, temperature (!) 97.3 F (36.3 C), temperature source Oral, resp. rate 19, height 5' 2 (1.575 m), weight 48.3 kg, SpO2 96%.        Intake/Output Summary (Last 24 hours) at 08/14/2024 1132 Last data filed at 08/14/2024 1036 Gross per 24 hour  Intake 4980.39 ml  Output --  Net 4980.39 ml   Filed Weights   08/13/24 2015  Weight: 48.3 kg    Examination: General: chronically ill elderly frail F NAD eating pancakes  HENT: NCAT pink mm  Lungs: CTAb even and unlabored  Cardiovascular: rr Abdomen: soft ndnt  Extremities: no cyanosis or clubbing  Neuro: AAO GU: defer  Resolved problem list   Assessment and Plan   Hypotension -LA remains normal which again argues against a true shock state  -BNP elevated, coox normal   P -wean  NE -cont midodrine -ECHO pending  -zosyn  -follow cx data   AoC HFrEF -elevated BNP noted P -echo pending -20 lasix    Elevated LFTs Possible acute cholecystitis -denies abd sx P -medrol  dosepak as rec from oncology  -zosyn   AKI NAGMA HypoK P -follow renal indices uop -giving modest dose lasix  11/7 + PO potassium  AoC anemia -repeat H/H   Chronic hypoxic resp failure  -2.5L at baseline P -O2 for goal > 88  -PRN albuterol   Extensive stage small cell lung cancer  -follow w dr sherrod  DNR/I GOC FTT Malnutrition -palliative consult placed 11/6  -sounds like progressive functional decline  -encourage PO intake    Labs   CBC: Recent Labs  Lab 08/13/24 1230 08/13/24 1442 08/14/24 0500  WBC 11.3* 13.0* 9.8  NEUTROABS 8.5*  --   --   HGB 9.2* 9.2* 7.3*  HCT 26.0* 28.3* 22.0*  MCV 81.5 87.9 86.3  PLT 309 289 227    Basic Metabolic Panel: Recent Labs  Lab 08/13/24 1230 08/13/24 1442 08/13/24 2100 08/14/24 0500  NA 133* 133* 133* 134*  K 4.5 4.7 4.6 3.8  CL 105 103 107 108  CO2 19* 15* 18* 18*  GLUCOSE 96 108* 106* 100*  BUN 38* 36* 34* 33*  CREATININE 1.91* 1.99* 1.78* 1.59*  CALCIUM  8.5* 8.8* 8.0* 8.0*  MG  --   --   --  1.9   GFR: Estimated Creatinine Clearance: 20.4 mL/min (A) (by C-G formula based on SCr of 1.59 mg/dL (H)). Recent Labs  Lab 08/13/24 1230 08/13/24 1442 08/13/24 1523 08/13/24 2100 08/14/24 0047 08/14/24 0500 08/14/24 0757  WBC 11.3* 13.0*  --   --   --  9.8  --   LATICACIDVEN  --   --  1.7 0.8 0.5  --  0.9    Liver Function Tests: Recent Labs  Lab 08/13/24 1230 08/13/24 1442 08/14/24 0500  AST 314* 386* 276*  ALT 164* 203* 152*  ALKPHOS 494* 595* 405*  BILITOT 1.8* 1.6* 1.2  PROT 6.8 7.3 5.8*  ALBUMIN 2.7* 2.9* 2.7*   No results for input(s): LIPASE, AMYLASE in the last 168 hours. No results for input(s): AMMONIA in the last 168 hours.  ABG    Component Value Date/Time   HCO3 19.0 (L)  08/13/2024 2239   TCO2 25 12/07/2023 1605   ACIDBASEDEF 6.2 (H) 08/13/2024 2239   O2SAT 70.1 08/14/2024 0801     Coagulation Profile: Recent Labs  Lab 08/13/24 1506  INR 1.2    Cardiac Enzymes: No results for input(s): CKTOTAL, CKMB, CKMBINDEX, TROPONINI in the last 168 hours.  HbA1C: Hgb A1c MFr Bld  Date/Time Value Ref Range Status  03/19/2024 04:02 AM 5.4 4.8 - 5.6 % Final    Comment:    (NOTE)         Prediabetes: 5.7 - 6.4         Diabetes: >6.4         Glycemic control for adults with diabetes: <7.0   12/07/2023 04:26 PM 5.9 (H) 4.8 - 5.6 % Final    Comment:    (NOTE) Pre diabetes:          5.7%-6.4%  Diabetes:              >6.4%  Glycemic control for   <7.0% adults with diabetes     CBG: Recent Labs  Lab 08/14/24 0050  GLUCAP 82    CRITICAL CARE Performed by: Ronnald FORBES Gave   Total critical care time: 36 minutes  Critical care time was exclusive of separately billable procedures and treating other patients. Critical care was necessary to treat or prevent imminent or life-threatening deterioration.  Critical care was time spent personally by me on the following activities: development of treatment plan with patient and/or surrogate as well as nursing, discussions with consultants, evaluation of patient's response to treatment, examination of patient, obtaining history from patient or surrogate, ordering and performing treatments and interventions, ordering and review of laboratory studies, ordering and review of radiographic studies, pulse oximetry and re-evaluation of patient's condition.  Ronnald Gave MSN, AGACNP-BC [facility] Amion for pager  08/14/2024, 11:32 AM

## 2024-08-14 NOTE — Progress Notes (Signed)
 Upon afternoon assessment, patient was not responding to external stimuli. RN had to sternal rub patient to get response. Patient was not answering questions or talking. Dr Theodoro notified by RN and assessed patient at bedside. Patient eventually opened eyes and said no in response to sternal rub, but did not converse further when prompted. Dr Theodoro ordered ABG. Patient's pupils are equal and VS remain stable at this time. Ronnald Gave, NP called patient's daughter to update her.

## 2024-08-14 NOTE — Progress Notes (Signed)
 To bedside to eval somnolence.  ABG had been ordered prior to my assessment   She awakens, and is yelling about the discomfort of being cleaned.  I spoke to her daughter on the phone who describes the frequency of her mom's sleepiness and that she spends most of her time at the nursing home sleeping. While on the phone with [place], ABG was attempted but patient yelling/thrashing precluded safe attempt-- I indicated ok to hold off & have dc the order     Ronnald Gave MSN, AGACNP-BC [facility]monary/Critical Care Medicine 08/14/2024, 5:27 PM

## 2024-08-14 NOTE — Plan of Care (Signed)
  Problem: Clinical Measurements: Goal: Ability to maintain clinical measurements within normal limits will improve Outcome: Progressing   Problem: Nutrition: Goal: Adequate nutrition will be maintained Outcome: Progressing   

## 2024-08-15 DIAGNOSIS — R7989 Other specified abnormal findings of blood chemistry: Secondary | ICD-10-CM

## 2024-08-15 DIAGNOSIS — C3492 Malignant neoplasm of unspecified part of left bronchus or lung: Secondary | ICD-10-CM

## 2024-08-15 DIAGNOSIS — Z7189 Other specified counseling: Secondary | ICD-10-CM

## 2024-08-15 DIAGNOSIS — Z79899 Other long term (current) drug therapy: Secondary | ICD-10-CM

## 2024-08-15 DIAGNOSIS — R52 Pain, unspecified: Secondary | ICD-10-CM

## 2024-08-15 DIAGNOSIS — R451 Restlessness and agitation: Secondary | ICD-10-CM

## 2024-08-15 DIAGNOSIS — Z515 Encounter for palliative care: Secondary | ICD-10-CM

## 2024-08-15 DIAGNOSIS — Z66 Do not resuscitate: Secondary | ICD-10-CM

## 2024-08-15 LAB — CBC
HCT: 23.7 % — ABNORMAL LOW (ref 36.0–46.0)
Hemoglobin: 8 g/dL — ABNORMAL LOW (ref 12.0–15.0)
MCH: 28.4 pg (ref 26.0–34.0)
MCHC: 33.8 g/dL (ref 30.0–36.0)
MCV: 84 fL (ref 80.0–100.0)
Platelets: 270 K/uL (ref 150–400)
RBC: 2.82 MIL/uL — ABNORMAL LOW (ref 3.87–5.11)
RDW: 16.5 % — ABNORMAL HIGH (ref 11.5–15.5)
WBC: 14.4 K/uL — ABNORMAL HIGH (ref 4.0–10.5)
nRBC: 0 % (ref 0.0–0.2)

## 2024-08-15 LAB — URINE CULTURE: Culture: >=100000 — AB

## 2024-08-15 LAB — COMPREHENSIVE METABOLIC PANEL WITH GFR
ALT: 147 U/L — ABNORMAL HIGH (ref 0–44)
AST: 231 U/L — ABNORMAL HIGH (ref 15–41)
Albumin: 3.1 g/dL — ABNORMAL LOW (ref 3.5–5.0)
Alkaline Phosphatase: 379 U/L — ABNORMAL HIGH (ref 38–126)
Anion gap: 10 (ref 5–15)
BUN: 39 mg/dL — ABNORMAL HIGH (ref 8–23)
CO2: 17 mmol/L — ABNORMAL LOW (ref 22–32)
Calcium: 8.3 mg/dL — ABNORMAL LOW (ref 8.9–10.3)
Chloride: 108 mmol/L (ref 98–111)
Creatinine, Ser: 1.67 mg/dL — ABNORMAL HIGH (ref 0.44–1.00)
GFR, Estimated: 30 mL/min — ABNORMAL LOW (ref >60)
Glucose, Bld: 163 mg/dL — ABNORMAL HIGH (ref 70–99)
Potassium: 4.2 mmol/L (ref 3.5–5.1)
Sodium: 135 mmol/L (ref 135–145)
Total Bilirubin: 1.6 mg/dL — ABNORMAL HIGH (ref 0.0–1.2)
Total Protein: 6.3 g/dL — ABNORMAL LOW (ref 6.5–8.1)

## 2024-08-15 LAB — MAGNESIUM: Magnesium: 1.7 mg/dL (ref 1.7–2.4)

## 2024-08-15 MED ORDER — POLYVINYL ALCOHOL 1.4 % OP SOLN: 1.0000 [drp] | Freq: Four times a day (QID) | OPHTHALMIC | Status: DC | PRN

## 2024-08-15 MED ORDER — HYDROMORPHONE HCL 1 MG/ML IJ SOLN
0.5000 mg | INTRAMUSCULAR | Status: DC | PRN
  Administered 2024-08-15: 0.5 mg via INTRAVENOUS
  Administered 2024-08-16: 1 mg via INTRAVENOUS
  Administered 2024-08-17: 0.5 mg via INTRAVENOUS
  Filled 2024-08-15 (×3): qty 1

## 2024-08-15 MED ORDER — HYDROCORTISONE SOD SUC (PF) 100 MG IJ SOLR
100.0000 mg | Freq: Two times a day (BID) | INTRAMUSCULAR | Status: DC
  Administered 2024-08-15: 100 mg via INTRAVENOUS
  Filled 2024-08-15: qty 2

## 2024-08-15 MED ORDER — VASOPRESSIN 20 UNITS/100 ML INFUSION FOR SHOCK
0.0000 [IU]/min | INTRAVENOUS | Status: DC
  Administered 2024-08-15: 0.03 [IU]/min via INTRAVENOUS
  Filled 2024-08-15: qty 100

## 2024-08-15 MED ORDER — ONDANSETRON HCL 4 MG/2ML IJ SOLN: 4.0000 mg | Freq: Three times a day (TID) | INTRAMUSCULAR | Status: DC | PRN

## 2024-08-15 MED ORDER — PIPERACILLIN-TAZOBACTAM 3.375 G IVPB 30 MIN: 3.3750 g | Freq: Once | INTRAVENOUS | Status: DC

## 2024-08-15 MED ORDER — LORAZEPAM 2 MG/ML IJ SOLN
1.0000 mg | INTRAMUSCULAR | Status: DC | PRN
Start: 2024-08-15 — End: 2024-08-19
  Administered 2024-08-16: 1 mg via INTRAVENOUS
  Filled 2024-08-15: qty 1

## 2024-08-15 MED ORDER — HALOPERIDOL LACTATE 5 MG/ML IJ SOLN: 1.0000 mg | INTRAMUSCULAR | Status: DC | PRN

## 2024-08-15 MED ORDER — GLYCOPYRROLATE 0.2 MG/ML IJ SOLN: 0.2000 mg | INTRAMUSCULAR | Status: DC | PRN

## 2024-08-15 MED ORDER — PIPERACILLIN-TAZOBACTAM 3.375 G IVPB
3.3750 g | Freq: Two times a day (BID) | INTRAVENOUS | Status: DC
  Administered 2024-08-15: 3.375 g via INTRAVENOUS
  Filled 2024-08-15: qty 50

## 2024-08-15 MED ORDER — BIOTENE DRY MOUTH MT LIQD: 15.0000 mL | OROMUCOSAL | Status: DC | PRN

## 2024-08-15 NOTE — TOC Progression Note (Signed)
 Transition of Care [facility]) - Progression Note    Patient Details  Name: Brittany Marks MRN: [id number] Date of Birth: 1941/06/06  [facility] CM/SW Contact  Lorraine LILLETTE Fenton, LCSW Phone Number: 08/15/2024, 11:17 AM  Clinical Narrative:    [place] from MD, pt daughter resides in [place] , pt full comfort care and daughter may not make it to [place] in time, daughter would like cremation resources.    CSW called daughter at contact number on file. Offered empathy as mother comfort care. Provided 3 crematories and answered additional questions within my scope. No further ICM needs.      Barriers to Discharge: Continued Medical Work up               Expected Discharge Plan and Services                                               Social Drivers of Health (SDOH) Interventions SDOH Screenings   Food Insecurity: Patient Declined (08/14/2024)  Housing: Unknown (08/14/2024)  Transportation Needs: Patient Declined (08/14/2024)  Utilities: Patient Declined (08/14/2024)  Depression (PHQ2-9): Low Risk  (05/19/2024)  Social Connections: Unknown (08/14/2024)  Tobacco Use: High Risk (08/14/2024)    Readmission Risk Interventions    03/26/2024   11:55 AM 12/08/2023   12:37 PM  Readmission Risk Prevention Plan  Post Dischage Appt  Complete  Medication Screening  Complete  Transportation Screening Complete Complete  PCP or Specialist Appt within 5-7 Days Complete   Home Care Screening Complete   Medication Review (RN CM) Complete

## 2024-08-15 NOTE — Progress Notes (Signed)
 eLink Physician-Brief Progress Note Patient Name: Brittany Marks DOB: [birth year] MRN: [id number]   Date of Service  08/15/2024  HPI/Events of Note  Notified that the patient pulled out peripheral IV while zosyn was infusing and barely half of the dose of was given.   eICU Interventions  Another dose of zosyn 3.375 g IV ordered as replacement dose.       Intervention Category Minor Interventions: Other:  Keanen Dohse 08/15/2024, 6:33 AM

## 2024-08-15 NOTE — Progress Notes (Signed)
 NAME:  Brittany Marks, MRN:  [id number], DOB:  1941-05-19, LOS: 2 ADMISSION DATE:  08/13/2024, CONSULTATION DATE:  08/13/24 REFERRING MD:  Arthea , CHIEF COMPLAINT:  abnormal labs    History of Present Illness:  83 yo hx metastatic small cell lung ca, COPD w chronic hypoxia, CVA, HFrEF who presented to ED from onc clinic for evaluation of elevated LFTs.  In ED, fluctuating soft Bps. Started on abx Admitted trh and pccm consulted in this setting  Pt feels well. Overall kind of tired but is c/w baseline. No fevers chills cough SOB chest pain abd pain n/v/d dysuria. Says she was pretty surprised Dr Montez referred her to the ED because she feels fine    Pertinent  Medical History  Lung cancer CVA HFrEF COPD on O2   Significant Hospital Events: Including procedures, antibiotic start and stop dates in addition to other pertinent events   11/6 to ED from onc office  11/7 started on pressors, weaning.   Interim History / Subjective:  Was started on pressors overnight   Feels well, doesn't feel like she should be in the hospital Says her blood pressure just goes up and down and its fine   Objective    Blood pressure (!) 122/40, pulse 80, temperature 98.4 F (36.9 C), temperature source Axillary, resp. rate 18, height 5' 2 (1.575 m), weight 48.3 kg, SpO2 100%.        Intake/Output Summary (Last 24 hours) at 08/15/2024 0802 Last data filed at 08/15/2024 9367 Gross per 24 hour  Intake 993.2 ml  Output 1400 ml  Net -406.8 ml   Filed Weights   08/13/24 2015  Weight: 48.3 kg    Examination: General: Ill-appearing female, frail Lungs: Clear to auscultation bilaterally Cardiovascular: Regular rate and rhythm, episodic tachycardia on telemetry Abdomen: Soft, ?  Tenderness when I push hard on the right upper quadrant however not consistent on exam Extremities: Warm, well-perfused, no edema Neuro: Alert and oriented   Resolved problem list   Assessment and Plan   Shock  likely septic less likely cardiogenic -BNP elevated, coox normal   -NE requirement over rate increased, added vaso - Hold home midodrine  -ECHO showing ejection fraction of 50 to 55% with no left ventricular wall motion abnormalities.  Elevated RV and PA pressure with slightly reduced systolic function - Currently on Zosyn, MRSA screen negative.  Blood cultures no growth to date.  Urine cultures reintubated for better growth.  Urinalysis with positive leukocyte esterase. -Abdominal ultrasound with small volume biliary sludge and punctate gallstones with circumferential wall thickening with wall edema.  Negative sonographic Murphy sign.  Equivocal findings for acute cholecystitis. Chest x-ray with no new airspace disease other than her prior left upper lobe cavitary lesion - N.p.o. today, will obtain HIDA scan.   Acute on chronic diastolic heart failure -elevated BNP noted -Holding off diuresis at this time  Elevated LFTs (enzymes downtrending) Possible acute cholecystitis -denies abd sx P -medrol  dosepak as rec from oncology.  Stopped Medrol  pack that was recommended by onc and added stress dose steroids -zosyn   AKI NAGMA HypoK P -follow renal indices uop -giving modest dose lasix  11/7 + PO potassium  AoC anemia -repeat H/H   Chronic hypoxic resp failure  -2.5L at baseline P -O2 for goal > 88  -PRN albuterol   Extensive stage small cell lung cancer  -follow w dr sherrod  DNR/I GOC FTT Malnutrition -palliative consult placed 11/6  -sounds like progressive functional decline  -encourage  PO intake   Palliative care evaluated patient and discussed clinical status with patient's daughter and they elected to pursue comfort measures only. Orders were changed to reflect those wishes.    Labs   CBC: Recent Labs  Lab 08/13/24 1230 08/13/24 1442 08/14/24 0500 08/14/24 1119 08/15/24 0507  WBC 11.3* 13.0* 9.8  --  14.4*  NEUTROABS 8.5*  --   --   --   --   HGB 9.2*  9.2* 7.3* 7.6* 8.0*  HCT 26.0* 28.3* 22.0* 23.0* 23.7*  MCV 81.5 87.9 86.3  --  84.0  PLT 309 289 227  --  270    Basic Metabolic Panel: Recent Labs  Lab 08/13/24 1230 08/13/24 1442 08/13/24 2100 08/14/24 0500 08/15/24 0507  NA 133* 133* 133* 134* 135  K 4.5 4.7 4.6 3.8 4.2  CL 105 103 107 108 108  CO2 19* 15* 18* 18* 17*  GLUCOSE 96 108* 106* 100* 163*  BUN 38* 36* 34* 33* 39*  CREATININE 1.91* 1.99* 1.78* 1.59* 1.67*  CALCIUM  8.5* 8.8* 8.0* 8.0* 8.3*  MG  --   --   --  1.9  --    GFR: Estimated Creatinine Clearance: 19.5 mL/min (A) (by C-G formula based on SCr of 1.67 mg/dL (H)). Recent Labs  Lab 08/13/24 1230 08/13/24 1442 08/13/24 1523 08/13/24 2100 08/14/24 0047 08/14/24 0500 08/14/24 0757 08/15/24 0507  WBC 11.3* 13.0*  --   --   --  9.8  --  14.4*  LATICACIDVEN  --   --  1.7 0.8 0.5  --  0.9  --     Liver Function Tests: Recent Labs  Lab 08/13/24 1230 08/13/24 1442 08/14/24 0500 08/15/24 0507  AST 314* 386* 276* 231*  ALT 164* 203* 152* 147*  ALKPHOS 494* 595* 405* 379*  BILITOT 1.8* 1.6* 1.2 1.6*  PROT 6.8 7.3 5.8* 6.3*  ALBUMIN 2.7* 2.9* 2.7* 3.1*   No results for input(s): LIPASE, AMYLASE in the last 168 hours. No results for input(s): AMMONIA in the last 168 hours.  ABG    Component Value Date/Time   HCO3 19.0 (L) 08/13/2024 2239   TCO2 25 12/07/2023 1605   ACIDBASEDEF 6.2 (H) 08/13/2024 2239   O2SAT 70.1 08/14/2024 0801     Coagulation Profile: Recent Labs  Lab 08/13/24 1506  INR 1.2    Cardiac Enzymes: No results for input(s): CKTOTAL, CKMB, CKMBINDEX, TROPONINI in the last 168 hours.  HbA1C: Hgb A1c MFr Bld  Date/Time Value Ref Range Status  03/19/2024 04:02 AM 5.4 4.8 - 5.6 % Final    Comment:    (NOTE)         Prediabetes: 5.7 - 6.4         Diabetes: >6.4         Glycemic control for adults with diabetes: <7.0   12/07/2023 04:26 PM 5.9 (H) 4.8 - 5.6 % Final    Comment:    (NOTE) Pre diabetes:           5.7%-6.4%  Diabetes:              >6.4%  Glycemic control for   <7.0% adults with diabetes     CBG: Recent Labs  Lab 08/14/24 0050  GLUCAP 82     The patient is critically ill due to shock requiring vasopressor titration.  Critical care was necessary to treat or prevent imminent or life-threatening deterioration. Critical care time was spent by me on the following activities: development of a  treatment plan with the patient and/or surrogate as well as nursing, discussions with consultants, evaluation of the patient's response to treatment, examination of the patient, obtaining a history from the patient or surrogate, ordering and performing treatments and interventions, ordering and review of laboratory studies, ordering and review of radiographic studies, review of telemetry data including pulse oximetry, re-evaluation of patient's condition and participation in multidisciplinary rounds.   I personally spent 36 minutes providing critical care not including any separately billable procedures.   Zola LOISE Herter, MD [facility] 08/15/2024 8:11 AM

## 2024-08-15 NOTE — Consult Note (Signed)
 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  During hospitalization patient received management for hypotension requiring transfer to ICU for pressor support, unknown source of shock, acute on chronic heart failure, elevated LFTs, malnutrition, and AKI.  Extensive review of EMR including recent documentation from PCCM provider, hospitalist, and nursing staff.  Discussed care with PCCM provider and nurse for medical updates.  Reviewed recent CMP noting creatinine elevated to 1.67 with estimated GFR of 30, and worsening elevation of total bilirubin at 1.6.  Reviewed outpatient oncology notes mentioning concern for immune hepatitis in setting of cancer directed therapies.  Also concern for cholecystitis though patient continuing to decline despite antibiotics in place.  Personally reviewed patient's recent abdominal right upper quadrant ultrasound. Patient noted to have become agitated overnight and ripped out her IV access.  Patient also having worsening hypotension and now requiring support on Levophed and vasopressin.  Presented to bedside to see patient.  Patient appeared ill and was lethargic so could not engage in meaningful  conversation.  ------------------------------------------------------------------------------------------------------------- Advance Care Planning Conversation  Pertinent diagnosis: Small cell lung cancer with metastatic disease on palliative therapy, history of CVA, chronic respiratory failure, COPD, shock with unknown source, failure to thrive, debility, lack of oral intake, AKI, elevated liver enzymes  The patient and family consented to a voluntary Advance Care Planning Conversation over the phone. Individuals present for the conversation: Patient unable to participate in complex medical decision making due to underlying medical status.  This provider discussed care with patient's next of kin listed in EMR, daughter Brittany Marks.  Summary of the conversation:   Able to call patient's daughter, Brittany Marks, and introduced myself as a member of the palliative medicine team and my role in patient's medical journey.  Able to learn about medical care patient has received open to this point.  Discussed medical updates such as patient's worsening medical status and worsening hypotension requiring further pressor support.  Spent time learning about patient prior to hospitalization.  Daughter noted that patient has been actively deteriorating over the past year.  Patient eating less and less and becoming weaker and weaker despite appropriate medical interventions.  Daughter acknowledging the deterioration she has seen in her mother.  With permission, able to discuss pathways for medical care moving forward.  Discussed can continue with aggressive medical interventions as patient is currently receiving.  Also discussed alternative pathway of transitioning to full comfort focused care in this setting since patient has continued to medically deteriorate despite aggressive medical interventions.  Spent time discussing comfort care and what this would and would not entail.  After extensive discussion and  answering questions, daughter agreeing with transitioning to full comfort focused care at this time.  Daughter lives in [place]  so she is unsure if she would be able to make it.  She is  agreeing with starting comfort focused care at this time knowing that patient's prognosis could be hours to days with her worsening hypotension.  Discussed providing medications for comfort at end-of-life.  Noted based on current evaluation, patient would likely die in the hospital.  Discussed if becomes appropriate, could consider inpatient hospice evaluation. Spent time answering questions as able.  Provided emotional support via active listening.  All questions answered at that time.  Noted palliative medicine team to continue to follow with patient's medical journey.  Outcome of the conversations and/or documents completed:  Transition to full comfort focused care at this time.  Currently in hospital death anticipated.  I spent 30 minutes providing separately identifiable ACP services with the patient and/or surrogate decision maker in a voluntary, in-person conversation discussing the patient's wishes and goals as detailed in the above note.  Tinnie Radar, DO Palliative Medicine Provider  -------------------------------------------------------------------------------------------------------------  Discussed care with PCCM provider and bedside RN to coordinate care transition to full comfort focused care at this time.  Primary Diagnoses  Present on Admission:  Small cell lung cancer, right upper lobe (HCC)  Acute CVA (cerebrovascular accident) (HCC)  Chronic systolic CHF (congestive heart failure) (HCC)  AKI (acute kidney injury)  Hypotension  Past Medical History:  Diagnosis Date   Acute CVA (cerebrovascular accident) (HCC) 12/07/2023   Cataract    Bilateral   Constipation    pain medication   Left rotator cuff tear    Right rotator cuff tear    SCL CA dx'd 11/2018   Social History   Socioeconomic  History   Marital status: Divorced    Spouse name: Not on file   Number of children: 1   Years of education: Not on file   Highest education level: Not on file  Occupational History   Not on file  Tobacco Use   Smoking status: Every Day    Current packs/day: 0.50    Average packs/day: 0.5 packs/day for 50.0 years (25.0 ttl pk-yrs)    Types: Cigarettes   Smokeless tobacco: Never   Tobacco comments:    trying to quit now, using the patch  Vaping Use   Vaping status: Never Used  Substance and Sexual Activity   Alcohol  use: Yes    Comment: RARE   Drug use: No   Sexual activity: Not Currently  Other Topics Concern   Not on file  Social History Narrative   Not on file   Social Drivers of Health   Financial Resource Strain: Not on file  Food Insecurity: Patient Declined (08/14/2024)   Hunger Vital Sign    Worried About Running Out of Food in the Last Year: Patient declined    Ran Out of Food in the Last Year: Patient declined  Transportation Needs: Patient Declined (08/14/2024)   PRAPARE - Administrator, Civil Service (Medical): Patient declined    Lack of Transportation (Non-Medical): Patient declined  Physical Activity: Not on file  Stress: Not on file  Social Connections: Unknown (08/14/2024)   Social Connection and Isolation Panel    Frequency of Communication with Friends and Family: Three times a week    Frequency of Social Gatherings with Friends and Family: Once a week    Attends Religious Services: Patient declined    Database Administrator or Organizations: Patient declined    Attends Banker Meetings: Patient declined    Marital Status: Patient declined   Family History  Problem Relation Age of Onset   Congestive  Heart Failure Mother    Congestive Heart Failure Father    Scheduled Meds:  Chlorhexidine  Gluconate Cloth  6 each Topical Daily   feeding supplement  237 mL Oral BID BM   heparin   5,000 Units Subcutaneous Q8H   Influenza  vac split trivalent PF  0.5 mL Intramuscular Tomorrow-1000   methylPREDNISolone   4 mg Oral PC supper   methylPREDNISolone   4 mg Oral 3 x daily with food   [START ON 08/16/2024] methylPREDNISolone   4 mg Oral 4X daily taper   methylPREDNISolone   8 mg Oral Nightly   methylPREDNISolone   8 mg Oral Nightly   midodrine  10 mg Oral TID WC   sodium bicarbonate  650 mg Oral TID   sodium chloride  flush  10-40 mL Intracatheter Q12H   Continuous Infusions:  norepinephrine (LEVOPHED) Adult infusion 20 mcg/min (08/15/24 9367)   piperacillin-tazobactam (ZOSYN)  IV     PRN Meds:.albuterol , mouth rinse, sodium chloride  flush Allergies  Allergen Reactions   Tape Other (See Comments)    SKIN IS DELICATE!!   Oxycodone  Other (See Comments)    Unsteady gait, fall, altered mental status   CBC:    Component Value Date/Time   WBC 14.4 (H) 08/15/2024 0507   HGB 8.0 (L) 08/15/2024 0507   HGB 9.2 (L) 08/13/2024 1230   HCT 23.7 (L) 08/15/2024 0507   PLT 270 08/15/2024 0507   PLT 309 08/13/2024 1230   MCV 84.0 08/15/2024 0507   NEUTROABS 8.5 (H) 08/13/2024 1230   LYMPHSABS 1.7 08/13/2024 1230   MONOABS 0.9 08/13/2024 1230   EOSABS 0.1 08/13/2024 1230   BASOSABS 0.1 08/13/2024 1230   Comprehensive Metabolic Panel:    Component Value Date/Time   NA 135 08/15/2024 0507   K 4.2 08/15/2024 0507   CL 108 08/15/2024 0507   CO2 17 (L) 08/15/2024 0507   BUN 39 (H) 08/15/2024 0507   CREATININE 1.67 (H) 08/15/2024 0507   CREATININE 1.91 (H) 08/13/2024 1230   GLUCOSE 163 (H) 08/15/2024 0507   CALCIUM  8.3 (L) 08/15/2024 0507   AST 231 (H) 08/15/2024 0507   AST 314 (HH) 08/13/2024 1230   ALT 147 (H) 08/15/2024 0507   ALT 164 (H) 08/13/2024 1230   ALKPHOS 379 (H) 08/15/2024 0507   BILITOT 1.6 (H) 08/15/2024 0507   BILITOT 1.8 (H) 08/13/2024 1230   PROT 6.3 (L) 08/15/2024 0507   ALBUMIN 3.1 (L) 08/15/2024 0507    Physical Exam: Vital Signs: BP (!) 108/55   Pulse 88   Temp 98.4 F (36.9 C) (Axillary)    Resp 20   Ht 5' 2 (1.575 m)   Wt 48.3 kg   SpO2 100%   BMI 19.48 kg/m  SpO2: SpO2: 100 % O2 Device: O2 Device: Nasal Cannula O2 Flow Rate: O2 Flow Rate (L/min): 2 L/min Intake/output summary:  Intake/Output Summary (Last 24 hours) at 08/15/2024 9341 Last data filed at 08/15/2024 9367 Gross per 24 hour  Intake 1068.65 ml  Output 1400 ml  Net -331.35 ml   LBM: Last BM Date : 08/14/24 Baseline Weight: Weight: 48.3 kg Most recent weight: Weight: 48.3 kg  General: Lethargic, ill-appearing, pale Cardiovascular: Tachycardia noted Respiratory: no increased work of breathing noted, not in respiratory distress Neuro: Lethargic          Palliative Performance Scale: 10%              Additional Data Reviewed: Recent Labs    08/14/24 0500 08/14/24 1119 08/15/24 0507  WBC 9.8  --  14.4*  HGB 7.3* 7.6* 8.0*  PLT 227  --  270  NA 134*  --  135  BUN 33*  --  39*  CREATININE 1.59*  --  1.67*    Imaging: ECHOCARDIOGRAM LIMITED    ECHOCARDIOGRAM LIMITED REPORT       Patient Name:   JASENIA WEILBACHER Date of Exam: 08/14/2024 Medical Rec #:  [id number]         Height:       62.0 in Accession #:    [id number]        Weight:       106.5 lb Date of Birth:  05/13/1941         BSA:          1.462 m Patient Age:    83 years          BP:           110/49 mmHg Patient Gender: F                 HR:           87 bpm. Exam Location:  Inpatient  Procedure: Limited Echo, Cardiac Doppler and Color Doppler (Both Spectral and            Color Flow Doppler were utilized during procedure).  Indications:    CHF   History:        Patient has prior history of Echocardiogram examinations, most                 recent 12/08/2023. CHF. Cancer.   Sonographer:    Philomena Daring Referring Phys: [id number] GRACE E BOWSER  IMPRESSIONS   1. Limited Echocardiogram.  2. Left ventricular ejection fraction, by estimation, is 50 to 55%. The left ventricle has low normal function. The left ventricle has no  regional wall motion abnormalities. Left ventricular diastolic parameters are consistent with Grade II diastolic  dysfunction (pseudonormalization). Elevated left atrial pressure. The E/e' is 18.5.  3. Right ventricular systolic function is mildly reduced. The right ventricular size is normal. There is moderately elevated pulmonary artery systolic pressure. The estimated right ventricular systolic pressure is 47.3 mmHg.  4. Left atrial size was grossly normal.  5. Right atrial size was grossly normal.  6. The mitral valve is degenerative. Mild mitral valve regurgitation. No evidence of mitral stenosis.  7. The aortic valve is tricuspid. Aortic valve regurgitation is not visualized. Aortic valve sclerosis is present, with no evidence of aortic valve stenosis.  8. The inferior vena cava is dilated in size with <50% respiratory variability, suggesting right atrial pressure of 15 mmHg.  Comparison(s): A prior study was performed on 12/08/2023. LVEF 40-45%, Grade II diastolic dysfunction, LAE severe, mild MR, estimated RAP 3 mmHG.  FINDINGS  Left Ventricle: Left ventricular ejection fraction, by estimation, is 50 to 55%. The left ventricle has low normal function. The left ventricle has no regional wall motion abnormalities. The left ventricular internal cavity size was normal in size.  There is no left ventricular hypertrophy. Left ventricular diastolic parameters are consistent with Grade II diastolic dysfunction (pseudonormalization). Elevated left atrial pressure. The E/e' is 18.5.  Right Ventricle: The right ventricular size is normal. Right ventricular systolic function is mildly reduced. There is moderately elevated pulmonary artery systolic pressure. The tricuspid regurgitant velocity is 2.84 m/s, and with an assumed right  atrial pressure of 15 mmHg, the estimated right ventricular systolic pressure is 47.3 mmHg.  Left Atrium: Left atrial size  was grossly normal.  Right Atrium: Right atrial  size was grossly normal.  Pericardium: There is no evidence of pericardial effusion.  Mitral Valve: The mitral valve is degenerative in appearance. Mild mitral valve regurgitation. No evidence of mitral valve stenosis.  Tricuspid Valve: The tricuspid valve is grossly normal. Tricuspid valve regurgitation is mild.  Aortic Valve: The aortic valve is tricuspid. Aortic valve regurgitation is not visualized. Aortic valve sclerosis is present, with no evidence of aortic valve stenosis.  Pulmonic Valve: The pulmonic valve was grossly normal. Pulmonic valve regurgitation is not visualized. No evidence of pulmonic stenosis.  Aorta: The aortic root and ascending aorta are structurally normal, with no evidence of dilitation.  Venous: The inferior vena cava is dilated in size with less than 50% respiratory variability, suggesting right atrial pressure of 15 mmHg.  Additional Comments: Spectral Doppler performed. Color Doppler performed.   LEFT VENTRICLE PLAX 2D LVIDd:         4.60 cm   Diastology LVIDs:         3.70 cm   LV e' medial:    5.98 cm/s LV PW:         1.00 cm   LV E/e' medial:  23.6 LV IVS:        1.00 cm   LV e' lateral:   10.60 cm/s LVOT diam:     2.00 cm   LV E/e' lateral: 13.3 LV SV:         51 LV SV Index:   35 LVOT Area:     3.14 cm    RIGHT VENTRICLE            IVC RV S prime:     8.27 cm/s  IVC diam: 2.30 cm TAPSE (M-mode): 1.6 cm  LEFT ATRIUM         Index LA diam:    4.10 cm 2.80 cm/m  AORTIC VALVE LVOT Vmax:   88.60 cm/s LVOT Vmean:  56.800 cm/s LVOT VTI:    0.163 m   AORTA Ao Root diam: 2.70 cm Ao Asc diam:  3.10 cm  MITRAL VALVE                TRICUSPID VALVE MV Area (PHT): 4.08 cm     TR Peak grad:   32.3 mmHg MV Decel Time: 186 msec     TR Vmax:        284.00 cm/s MV E velocity: 141.00 cm/s MV A velocity: 117.00 cm/s  SHUNTS MV E/A ratio:  1.21         Systemic VTI:  0.16 m                             Systemic Diam: 2.00 cm  Sunit  [place] Electronically signed by Madonna Large Signature Date/Time: 08/14/2024/8:57:49 PM      Final      I personally reviewed recent imaging.   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  During  hospitalization patient received management for hypotension requiring transfer to ICU for pressor support, unknown source of shock, acute on chronic heart failure, elevated LFTs, malnutrition, and AKI.  # Complex medical decision making/goals of care  # Complex medical decision making/goals of care  -Patient unable to participate in medical decision making secondary to medical  status.  -Spoke with patient's daughter/NOK, Brittany Marks, as detailed above in HPI.  Discussed patient's current medical situation and continued deterioration despite appropriate medical interventions.  Spent time discussing pathways for medical care moving forward.  After discussion, daughter agreeing with transition to full comfort focused care at this time.  Currently in hospital death is anticipated.  If patient remains stable for transfer, could consider inpatient hospice.  Palliative medicine team will need to follow along with patient's medical journey.  -At this time we will discontinue interventions that are no longer focused on comfort such as IV fluids, imaging, or lab work.  Will instead focus on symptom management of pain, dyspnea, and agitation in the setting of  end-of-life care.    Code Status: Do not attempt resuscitation (DNR) - Comfort care  # Symptom management Patient is receiving these palliative interventions for symptom management with an intent to improve quality of life.     -Pain/Dyspnea, acute in the setting of end-of-life care                               -Start IV Dilaudid  0.5-2 mg every 30 minute as needed.  Continue to adjust based on patient's symptom burden.  If patient needing frequent dosing, may need to consider continuous infusion.                  -Anxiety/agitation, in the setting of end-of-life care                               -Start IV Ativan 1 mg every 4 hours as needed. Continue to adjust based on patient's symptom burden.                                 -Start IV Haldol 1-2 mg every 4 hours as needed. Continue to adjust based on patient's symptom burden.                   -Secretions, in the setting of end-of-life care                               -Start IV glycopyrrolate  0.2 mg every 4 hours as needed.  # Psycho-social/Spiritual Support:  - Support System: Daughter-Brittany lives in [place]   # Discharge Planning:  Anticipated Hospital Death  Thank you for allowing the palliative care team to participate in the care Avelina ONEIDA Norrine Tinnie Clayton, DO Palliative Care Provider PMT # [phone]  If patient remains symptomatic despite maximum doses, please call PMT at [phone] between 0700 and 1900. Outside of these hours, please call attending, as PMT does not have night coverage.  Billing based on MDM: High  Problems Addressed: One or more chronic illnesses with severe exacerbation, progression, or side effects of treatment.  Amount and/or Complexity of Data: Category 1:Review of prior external note(s) from each unique source, Review of the result(s) of each unique test, and  Assessment requiring an independent historian(s), Category 2:Independent interpretation of a test performed by another  physician/other qualified health care professional (not separately reported), and Category 3:Discussion of management or test interpretation with external physician/other qualified health care professional/appropriate source (not separately reported)  Risks: Parenteral controlled substances

## 2024-08-16 DIAGNOSIS — D649 Anemia, unspecified: Secondary | ICD-10-CM

## 2024-08-16 DIAGNOSIS — Z515 Encounter for palliative care: Secondary | ICD-10-CM

## 2024-08-16 DIAGNOSIS — A419 Sepsis, unspecified organism: Principal | ICD-10-CM

## 2024-08-16 DIAGNOSIS — Z711 Person with feared health complaint in whom no diagnosis is made: Secondary | ICD-10-CM

## 2024-08-16 DIAGNOSIS — N179 Acute kidney failure, unspecified: Secondary | ICD-10-CM | POA: Diagnosis not present

## 2024-08-16 DIAGNOSIS — N390 Urinary tract infection, site not specified: Secondary | ICD-10-CM

## 2024-08-16 DIAGNOSIS — K819 Cholecystitis, unspecified: Secondary | ICD-10-CM

## 2024-08-16 DIAGNOSIS — R6521 Severe sepsis with septic shock: Secondary | ICD-10-CM

## 2024-08-16 DIAGNOSIS — I5033 Acute on chronic diastolic (congestive) heart failure: Secondary | ICD-10-CM

## 2024-08-16 DIAGNOSIS — J9611 Chronic respiratory failure with hypoxia: Secondary | ICD-10-CM

## 2024-08-16 DIAGNOSIS — Z7189 Other specified counseling: Secondary | ICD-10-CM

## 2024-08-16 DIAGNOSIS — C3492 Malignant neoplasm of unspecified part of left bronchus or lung: Secondary | ICD-10-CM

## 2024-08-16 NOTE — Progress Notes (Signed)
 Daily Progress Note   Patient Name: Brittany Marks       Date: 08/16/2024 DOB: 07-May-1941  Age: 83 y.o. MRN#: [id number] Attending Physician: Theodoro Lakes, MD Primary Care Physician: Pcp, No Admit Date: 08/13/2024 Length of Stay: 3 days  Reason for Consultation/Follow-up: Establishing goals of care  Subjective:   Reviewed EMR including recent documentation from [facility] provider.  At time of EMR reviewed past 24 hours patient has received as needed IV Dilaudid  0.5 mg x 1 dose. Discussed care with bedside RN for updates.  Presented to bedside to see patient.  No visitors present at bedside.  Patient laying in bed unresponsive when seen.  Does show signs of agitation and pain at times with grimacing and ended up eulogy to appear comfortable.  RN to provide as needed medication.  Patient being transferred to floor to continue comfort focused care.  Called patient's daughter, Brittany Marks, to provide medical updates.  Based on current assessment, we discussed continuing comfort focused care in the hospital.  Patient does not appear currently stable for transport to inpatient hospice.  Noted if this changes, could be evaluated for inpatient hospice.  Spent time answering questions as able and providing emotional support via active listening.  Noted palliative medicine team will continue to follow with patient's medical journey.  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 During hospitalization patient received management for hypotension requiring transfer to ICU for pressor support, unknown source of shock, acute on chronic heart failure, elevated LFTs, malnutrition, and AKI.  Palliative medicine team consulted to assist with complex medical decision making.  Recommendations/Plan: # Complex medical decision making/goals of care:    -Patient unable to participate in medical decision making secondary to medical  status.                -Spoke with patient's daughter/NOK, Brittany Marks, as detailed above in HPI.  Patient was transition to full comfort focused care on 08-20-2024. Currently in hospital death is anticipated.  If patient appears stable for transfer, could consider inpatient hospice.  Palliative medicine team will need to follow along with patient's medical journey.                - Have discontinued interventions that are no longer focused on comfort such as IV fluids, imaging, or lab work.  Focusing on symptom management of pain, dyspnea, and agitation in the setting of end-of-life care.                  Code Status: Do not attempt resuscitation (DNR) - Comfort care   # Symptom management Patient is receiving these palliative  interventions for symptom management with an intent to improve quality of life.                   -Pain/Dyspnea, acute in the setting of end-of-life care                               - Continue IV Dilaudid  0.5-2 mg every 30 minute as needed.  Continue to adjust based on patient's symptom burden.  If patient needing frequent dosing, may need to consider continuous infusion.                   -Anxiety/agitation, in the setting of end-of-life care                               - Continue IV Ativan 1 mg every 4 hours as needed. Continue to adjust based on patient's symptom burden.                                 - Continue IV Haldol 1-2 mg every 4 hours as needed. Continue to adjust based on patient's symptom burden.                   -Secretions, in the setting of end-of-life care                               - Continue IV glycopyrrolate  0.2 mg every 4 hours as needed.   # Psycho-social/Spiritual Support:  - Support System: Daughter-Brittany lives in [place]    # Discharge Planning:  Anticipated Hospital Death  Discussed with: Bedside RN, patient's daughter  Thank you for allowing the palliative care team to participate in the care Avelina ONEIDA Norrine Tinnie Clayton, DO Palliative Care Provider PMT # [phone]  If patient remains symptomatic despite maximum doses, please call PMT at [phone] between 0700 and 1900. Outside of these hours, please call attending, as PMT does not have night coverage.

## 2024-08-16 NOTE — Progress Notes (Signed)
 NAME:  Brittany Marks, MRN:  [id number], DOB:  03-04-1941, LOS: 3 ADMISSION DATE:  08/13/2024, CONSULTATION DATE:  08/13/24 REFERRING MD:  Arthea , CHIEF COMPLAINT:  abnormal labs    History of Present Illness:  83 yo hx metastatic small cell lung ca, COPD w chronic hypoxia, CVA, HFrEF who presented to ED from onc clinic for evaluation of elevated LFTs.  In ED, fluctuating soft Bps. Started on abx Admitted trh and pccm consulted in this setting  Pt feels well. Overall kind of tired but is c/w baseline. No fevers chills cough SOB chest pain abd pain n/v/d dysuria. Says she was pretty surprised Dr Montez referred her to the ED because she feels fine    Pertinent  Medical History  Lung cancer CVA HFrEF COPD on O2   Significant Hospital Events: Including procedures, antibiotic start and stop dates in addition to other pertinent events   11/6 to ED from onc office  11/7 started on pressors, weaning.  11/8: Made comfort measures only.  Interim History / Subjective:  Per nursing was agitated.  When I saw the patient patient was comfortable.  She was getting Ativan.  Objective    Blood pressure 91/60, pulse 93, temperature 98.1 F (36.7 C), temperature source Axillary, resp. rate 15, height 5' 2 (1.575 m), weight 48.3 kg, SpO2 100%.        Intake/Output Summary (Last 24 hours) at 08/16/2024 1015 Last data filed at 08/15/2024 1154 Gross per 24 hour  Intake 114.45 ml  Output --  Net 114.45 ml   Filed Weights   08/13/24 2015  Weight: 48.3 kg    Examination: General: Elderly female in no apparent distress. Lungs: clear to auscultation bilaterally.  Heart: regular rate rhythm, no murmur appreciated.  Abdomen: non tender, non distended. Normal BS.  Neuro: She was alert but not answering questions.   Resolved problem list   Assessment and Plan   Shock likely septic less likely cardiogenic UA with questionable UTI: ?Cholecystitis Was admitted to ICU for hypotension.   Was on norepinephrine and was started on midodrine.  Was being covered with antibiotics.  Last she was on Zosyn. Her imaging studies were questionable for cholecystitis though her real source was never found out. With her continued decline she was subsequently made comfort measures only. On benzos, antipsychotics and opiates for symptom management.   Acute on chronic diastolic heart failure -elevated BNP noted -Holding off diuresis at this time  Elevated LFTs (enzymes downtrending) -Unclear cause.  AKI NAGMA HypoK -On comfort measures.  AoC anemia Chronic hypoxic resp failure  Extensive stage small cell lung cancer  - On supplemental oxygen  and on comfort measures.  Will transfer the patient to the floor.   Labs   CBC: Recent Labs  Lab 08/13/24 1230 08/13/24 1442 08/14/24 0500 08/14/24 1119 08/15/24 0507  WBC 11.3* 13.0* 9.8  --  14.4*  NEUTROABS 8.5*  --   --   --   --   HGB 9.2* 9.2* 7.3* 7.6* 8.0*  HCT 26.0* 28.3* 22.0* 23.0* 23.7*  MCV 81.5 87.9 86.3  --  84.0  PLT 309 289 227  --  270    Basic Metabolic Panel: Recent Labs  Lab 08/13/24 1230 08/13/24 1442 08/13/24 2100 08/14/24 0500 08/15/24 0500 08/15/24 0507  NA 133* 133* 133* 134*  --  135  K 4.5 4.7 4.6 3.8  --  4.2  CL 105 103 107 108  --  108  CO2 19* 15* 18* 18*  --  17*  GLUCOSE 96 108* 106* 100*  --  163*  BUN 38* 36* 34* 33*  --  39*  CREATININE 1.91* 1.99* 1.78* 1.59*  --  1.67*  CALCIUM  8.5* 8.8* 8.0* 8.0*  --  8.3*  MG  --   --   --  1.9 1.7  --    GFR: Estimated Creatinine Clearance: 19.5 mL/min (A) (by C-G formula based on SCr of 1.67 mg/dL (H)). Recent Labs  Lab 08/13/24 1230 08/13/24 1442 08/13/24 1523 08/13/24 2100 08/14/24 0047 08/14/24 0500 08/14/24 0757 08/15/24 0507  WBC 11.3* 13.0*  --   --   --  9.8  --  14.4*  LATICACIDVEN  --   --  1.7 0.8 0.5  --  0.9  --     Liver Function Tests: Recent Labs  Lab 08/13/24 1230 08/13/24 1442 08/14/24 0500 08/15/24 0507   AST 314* 386* 276* 231*  ALT 164* 203* 152* 147*  ALKPHOS 494* 595* 405* 379*  BILITOT 1.8* 1.6* 1.2 1.6*  PROT 6.8 7.3 5.8* 6.3*  ALBUMIN 2.7* 2.9* 2.7* 3.1*   No results for input(s): LIPASE, AMYLASE in the last 168 hours. No results for input(s): AMMONIA in the last 168 hours.  ABG    Component Value Date/Time   HCO3 19.0 (L) 08/13/2024 2239   TCO2 25 12/07/2023 1605   ACIDBASEDEF 6.2 (H) 08/13/2024 2239   O2SAT 70.1 08/14/2024 0801     Coagulation Profile: Recent Labs  Lab 08/13/24 1506  INR 1.2    Cardiac Enzymes: No results for input(s): CKTOTAL, CKMB, CKMBINDEX, TROPONINI in the last 168 hours.  HbA1C: Hgb A1c MFr Bld  Date/Time Value Ref Range Status  03/19/2024 04:02 AM 5.4 4.8 - 5.6 % Final    Comment:    (NOTE)         Prediabetes: 5.7 - 6.4         Diabetes: >6.4         Glycemic control for adults with diabetes: <7.0   12/07/2023 04:26 PM 5.9 (H) 4.8 - 5.6 % Final    Comment:    (NOTE) Pre diabetes:          5.7%-6.4%  Diabetes:              >6.4%  Glycemic control for   <7.0% adults with diabetes     CBG: Recent Labs  Lab 08/14/24 0050  GLUCAP 82   I personally spent a total of 35 minutes in the care of the patient today including preparing to see the patient, getting/reviewing separately obtained history, performing a medically appropriate exam/evaluation, documenting clinical information in the EHR, and independently interpreting results.   Sammi Fredericks, MD.

## 2024-08-16 NOTE — Progress Notes (Signed)
 Patient is transferred to room 1335.  Patient is unresponsive. Pupils are fixed, shallow respirations with period of apnea. No signs/symptoms of discomfort, comfort care is continue as indicated.

## 2024-08-17 ENCOUNTER — Telehealth: Payer: Self-pay | Admitting: *Deleted

## 2024-08-17 DIAGNOSIS — I959 Hypotension, unspecified: Secondary | ICD-10-CM | POA: Diagnosis not present

## 2024-08-17 DIAGNOSIS — C3411 Malignant neoplasm of upper lobe, right bronchus or lung: Secondary | ICD-10-CM | POA: Diagnosis not present

## 2024-08-17 DIAGNOSIS — R7989 Other specified abnormal findings of blood chemistry: Secondary | ICD-10-CM

## 2024-08-17 DIAGNOSIS — N179 Acute kidney failure, unspecified: Secondary | ICD-10-CM | POA: Diagnosis not present

## 2024-08-17 NOTE — Progress Notes (Signed)
 [facility]                                                                               Brittany Marks, is a 83 y.o. female, DOB - [birth year], FMW:[id number] Admit date - 08/13/2024    Outpatient Primary MD for the patient is Pcp, No  LOS - 4  days    Brief summary   83 yo hx metastatic small cell lung ca, COPD w chronic hypoxia, CVA, HFrEF who presented to ED from onc clinic for evaluation of elevated LFTs. Shew as found to be hypotensive and transferred to ICU for vasopressors. In view of her multiple medical issues and progressive decline, palliative care consulted for goals of care. She was transitioned to comfort measures.    Assessment & Plan   Septic shock Likely cholecystitis versus questionable UTI  Her imaging for evaluation of acute cholecystitis was questionable.  She was started on IV antibiotics was on vasopressors briefly. Palliative care consulted and she was transition to comfort measures   AKI   Anemia of chronic disease   Chronic hypoxic respiratory failure in the setting of extensive small stage lung cancer.    Mild acute on chronic diastolic heart failure  Estimated body mass index is 19.48 kg/m as calculated from the following:   Height as of this encounter: 5' 2 (1.575 m).   Weight as of this encounter: 48.3 kg.  Code Status: DNR comfort care DVT Prophylaxis:     Level of Care: Level of care: Med-Surg Family Communication: none at bedside.   Disposition Plan:     Remains inpatient appropriate:  pending.   Procedures:   None.  Consultants:   Palliative care PCCM.  Antimicrobials:   Anti-infectives (From admission, onward)    Start     Dose/Rate Route Frequency Ordered Stop   08/15/24 1800  vancomycin  (VANCOREADY) IVPB 750 mg/150 mL  Status:  Discontinued        750 mg 150 mL/hr over 60 Minutes Intravenous Every 48 hours 08/13/24 1755 08/14/24 0844   08/15/24 0745  piperacillin-tazobactam (ZOSYN) IVPB 3.375 g   Status:  Discontinued        3.375 g 12.5 mL/hr over 240 Minutes Intravenous 2 times daily 08/15/24 0647 08/15/24 1058   08/15/24 0730  piperacillin-tazobactam (ZOSYN) IVPB 3.375 g  Status:  Discontinued        3.375 g 100 mL/hr over 30 Minutes Intravenous  Once 08/15/24 0632 08/15/24 0647   08/14/24 1600  ceFEPIme  (MAXIPIME ) 2 g in sodium chloride  0.9 % 100 mL IVPB  Status:  Discontinued        2 g 200 mL/hr over 30 Minutes Intravenous Every 24 hours 08/13/24 1755 08/14/24 1126   08/14/24 1230  piperacillin-tazobactam (ZOSYN) IVPB 3.375 g  Status:  Discontinued        3.375 g 12.5 mL/hr over 240 Minutes Intravenous Every 8 hours 08/14/24 1134 08/15/24 0647   08/13/24 1515  ceFEPIme  (MAXIPIME ) 2 g in sodium chloride  0.9 % 100 mL IVPB        2 g 200 mL/hr over 30 Minutes Intravenous  Once 08/13/24 1506 08/13/24 1607   08/13/24 1515  metroNIDAZOLE (  FLAGYL) IVPB 500 mg        500 mg 100 mL/hr over 60 Minutes Intravenous  Once 08/13/24 1506 08/13/24 1710   08/13/24 1515  vancomycin  (VANCOCIN ) IVPB 1000 mg/200 mL premix        1,000 mg 200 mL/hr over 60 Minutes Intravenous  Once 08/13/24 1506 08/13/24 1819        Medications  Scheduled Meds: Continuous Infusions: PRN Meds:.albuterol , antiseptic oral rinse, artificial tears, glycopyrrolate , haloperidol lactate, HYDROmorphone  (DILAUDID ) injection, LORazepam, ondansetron  (ZOFRAN ) IV, mouth rinse, sodium chloride  flush    Subjective:   Brittany Marks was seen and examined today.  Pt unresponsive.   Objective:   Vitals:   08/15/24 2000 08/15/24 2100 08/15/24 2200 08/17/24 0325  BP:    (!) 51/37  Pulse: 79 89 93 (!) 107  Resp: 13 12 15 20   Temp:    98.2 F (36.8 C)  TempSrc:    Oral  SpO2: 100% 100% 100% 97%  Weight:      Height:       No intake or output data in the 24 hours ending 08/17/24 1130 Filed Weights   08/13/24 2015  Weight: 48.3 kg     Exam General exam: Ill appearing lady on NRB.  Respiratory system:  diminished at bases, on NRB. Cardiovascular system: S1 & S2 heard, RRR.  Gastrointestinal system: Abdomen is soft  Central nervous system: unresponsive  Extremities: no edema.  Skin: No rashes, Psychiatry: unable to assess  Data Reviewed:  I have personally reviewed following labs and imaging studies   CBC Lab Results  Component Value Date   WBC 14.4 (H) 08/15/2024   RBC 2.82 (L) 08/15/2024   HGB 8.0 (L) 08/15/2024   HCT 23.7 (L) 08/15/2024   MCV 84.0 08/15/2024   MCH 28.4 08/15/2024   PLT 270 08/15/2024   MCHC 33.8 08/15/2024   RDW 16.5 (H) 08/15/2024   LYMPHSABS 1.7 08/13/2024   MONOABS 0.9 08/13/2024   EOSABS 0.1 08/13/2024   BASOSABS 0.1 08/13/2024     Last metabolic panel Lab Results  Component Value Date   NA 135 08/15/2024   K 4.2 08/15/2024   CL 108 08/15/2024   CO2 17 (L) 08/15/2024   BUN 39 (H) 08/15/2024   CREATININE 1.67 (H) 08/15/2024   GLUCOSE 163 (H) 08/15/2024   GFRNONAA 30 (L) 08/15/2024   GFRAA >60 07/11/2020   CALCIUM  8.3 (L) 08/15/2024   PHOS 2.7 03/19/2024   PROT 6.3 (L) 08/15/2024   ALBUMIN 3.1 (L) 08/15/2024   BILITOT 1.6 (H) 08/15/2024   ALKPHOS 379 (H) 08/15/2024   AST 231 (H) 08/15/2024   ALT 147 (H) 08/15/2024   ANIONGAP 10 08/15/2024    CBG (last 3)  No results for input(s): GLUCAP in the last 72 hours.    Coagulation Profile: Recent Labs  Lab 08/13/24 1506  INR 1.2     Radiology Studies: No results found.     Elgie Butter M.D. [facility] 08/17/2024, 11:30 AM  Available via Epic secure chat 7am-7pm After 7 pm, please refer to night coverage provider listed on amion.

## 2024-08-17 NOTE — Progress Notes (Signed)
 Daily Progress Note   Patient Name: Brittany Marks       Date: 08/17/2024 DOB: 1941/09/13  Age: 83 y.o. MRN#: [id number] Attending Physician: Cherlyn Labella, MD Primary Care Physician: Pcp, No Admit Date: 08/13/2024 Length of Stay: 4 days  Reason for Consultation/Follow-up: Establishing goals of care  Subjective:   Reviewed EMR including recent documentation from [facility] provider.  At time of EMR reviewed past 24 hours patient has received as needed IV Dilaudid  0.5 mg x 1 dose. Discussed care with bedside RN for updates.  Presented to bedside to see patient.  No visitors present at bedside.  Patient laying in bed unresponsive when seen.  Discussed with RN No non verbal signs of distress or discomfort.     palliative medicine team will continue to follow with patient's medical journey.  Objective:   Vital Signs:  BP (!) 51/37   Pulse (!) 107   Temp 98.2 F (36.8 C) (Oral)   Resp 20   Ht 5' 2 (1.575 m)   Wt 48.3 kg   SpO2 97%   BMI 19.48 kg/m   Physical Exam: General: Unresponsive, ill-appearing, pale Cardiovascular: RRR Respiratory: no increased work of breathing noted, not in respiratory distress, no apneic spells.  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 During hospitalization patient received management for hypotension requiring transfer to ICU for pressor support, unknown source of shock, acute on chronic heart  failure, elevated LFTs, malnutrition, and AKI.  Palliative medicine team consulted to assist with complex medical decision making.  Recommendations/Plan: # Complex medical decision making/goals of care:    -Patient unable to participate in medical decision making secondary to medical  status.                -Spoke with patient's daughter/NOK, Brittany Marks, as detailed above in HPI.  Patient was transition to full comfort focused care on 27-Aug-2024. Currently in hospital death is anticipated.  If patient appears stable for transfer, could consider inpatient hospice.  Palliative medicine team will need to follow along with patient's medical journey.                - Have discontinued interventions that are no longer focused on comfort such as IV fluids, imaging, or lab work.  Focusing on symptom management of pain, dyspnea, and agitation in the setting of end-of-life care.                  Code Status: Do not attempt resuscitation (DNR) - Comfort care   # Symptom management Patient is receiving these palliative interventions for symptom management with an intent to improve quality of life.                   -Pain/Dyspnea, acute in the setting of end-of-life care                               - Continue IV Dilaudid  0.5-2 mg every 30 minute as  needed.  Continue to adjust based on patient's symptom burden.  If patient needing frequent dosing, may need to consider continuous infusion. Will continue to monitor.                   -Anxiety/agitation, in the setting of end-of-life care                               - Continue IV Ativan 1 mg every 4 hours as needed. Continue to adjust based on patient's symptom burden.                                 - Continue IV Haldol 1-2 mg every 4 hours as needed. Continue to adjust based on patient's symptom burden.                   -Secretions, in the setting of end-of-life care                               - Continue IV glycopyrrolate  0.2 mg every 4 hours as  needed.   # Psycho-social/Spiritual Support:  - Support System: Daughter-Brittany lives in [place]    # Discharge Planning:  Anticipated Hospital Death  Discussed with: Bedside RN  Thank you for allowing the palliative care team to participate in the care Avelina ONEIDA Shears  Mod MDM Lonia Serve MD.  Palliative Care Provider PMT # [phone]  If patient remains symptomatic despite maximum doses, please call PMT at [phone] between 0700 and 1900. Outside of these hours, please call attending, as PMT does not have night coverage.

## 2024-08-17 NOTE — TOC Progression Note (Signed)
 Transition of Care [facility]) - Progression Note    Patient Details  Name: Brittany Marks MRN: [id number] Date of Birth: 1941/01/01  Transition of Care [facility]) CM/SW Contact  Alfonse JONELLE Rex, RN Phone Number: 08/17/2024, 11:44 AM  Clinical Narrative: Patient is now Comfort Care, hospital death expected.          Barriers to Discharge: Continued Medical Work up               Expected Discharge Plan and Services                                               Social Drivers of Health (SDOH) Interventions SDOH Screenings   Food Insecurity: Patient Declined (08/14/2024)  Housing: Unknown (08/14/2024)  Transportation Needs: Patient Declined (08/14/2024)  Utilities: Patient Declined (08/14/2024)  Depression (PHQ2-9): Low Risk  (05/19/2024)  Social Connections: Unknown (08/14/2024)  Tobacco Use: High Risk (08/14/2024)    Readmission Risk Interventions    03/26/2024   11:55 AM 12/08/2023   12:37 PM  Readmission Risk Prevention Plan  Post Dischage Appt  Complete  Medication Screening  Complete  Transportation Screening Complete Complete  PCP or Specialist Appt within 5-7 Days Complete   Home Care Screening Complete   Medication Review (RN CM) Complete

## 2024-08-17 NOTE — Plan of Care (Signed)

## 2024-08-17 NOTE — Telephone Encounter (Signed)
 Received call from daughter. States pt. admitted to [facility] and is not doing well. States the MD indicated she would not make it to transfer to inpatient Hospice. Plan is comfort Care.

## 2024-08-18 DIAGNOSIS — N179 Acute kidney failure, unspecified: Secondary | ICD-10-CM | POA: Diagnosis not present

## 2024-08-18 DIAGNOSIS — A419 Sepsis, unspecified organism: Secondary | ICD-10-CM | POA: Diagnosis present

## 2024-08-18 LAB — CULTURE, BLOOD (ROUTINE X 2): Culture: NO GROWTH

## 2024-08-18 NOTE — Plan of Care (Signed)
   Problem: Education: Goal: Knowledge of General Education information will improve Description: Including pain rating scale, medication(s)/side effects and non-pharmacologic comfort measures Outcome: Not Progressing   Problem: Health Behavior/Discharge Planning: Goal: Ability to manage health-related needs will improve Outcome: Not Progressing

## 2024-08-18 NOTE — Progress Notes (Signed)
 Daily Progress Note   Patient Name: Brittany Marks       Date: 08/18/2024 DOB: 12-Dec-1940  Age: 83 y.o. MRN#: [id number] Attending Physician: Barbarann Nest, MD Primary Care Physician: Pcp, No Admit Date: 08/13/2024 Length of Stay: 5 days  Reason for Consultation/Follow-up: Establishing goals of care  Subjective:   Reviewed EMR including recent documentation from [facility] provider.  At time of EMR reviewed past 24 hours patient has received as needed IV Dilaudid  0.5 mg x 1 dose. Discussed care with bedside RN for updates.  Presented to bedside to see patient.  No visitors present at bedside.  Patient laying in bed unresponsive when seen.  Discussed with RN No non verbal signs of distress or discomfort.     palliative medicine team will continue to follow with patient's medical journey.  Objective:   Vital Signs:  BP (!) 76/53 (BP Location: Right Arm)   Pulse (!) 114   Temp (!) 97.4 F (36.3 C) (Oral)   Resp 15   Ht 5' 2 (1.575 m)   Wt 48.3 kg   SpO2 99%   BMI 19.48 kg/m   Physical Exam: General: Unresponsive, ill-appearing, pale Cardiovascular: RRR Respiratory: no increased work of breathing noted, not in respiratory distress, no apneic spells.  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 During hospitalization patient received management for hypotension requiring transfer to ICU for pressor support, unknown source of  shock, acute on chronic heart failure, elevated LFTs, malnutrition, and AKI.  Palliative medicine team consulted to assist with complex medical decision making.  Recommendations/Plan: # Complex medical decision making/goals of care:    -Patient unable to participate in medical decision making secondary to medical  status.                -Spoke with patient's daughter/NOK, Brittany Marks, as detailed above in HPI.  Patient was transition to full comfort focused care on September 06, 2024. Currently in hospital death is anticipated.  If patient appears stable for transfer, could consider inpatient hospice.  Palliative medicine team will need to follow along with patient's medical journey.                - Have discontinued interventions that are no longer focused on comfort such as IV fluids, imaging, or lab work.  Focusing on symptom management of pain, dyspnea, and agitation in the setting of end-of-life care.                  Code Status: Do not attempt resuscitation (DNR) - Comfort care   # Symptom management Patient is receiving these palliative interventions for symptom management with an intent to improve quality of life.                   -Pain/Dyspnea, acute in the setting of end-of-life care                               - Continue IV Dilaudid  0.5-2  mg every 30 minute as needed.  Continue to adjust based on patient's symptom burden.  If patient needing frequent dosing, may need to consider continuous infusion. Will continue to monitor.                   -Anxiety/agitation, in the setting of end-of-life care                               - Continue IV Ativan 1 mg every 4 hours as needed. Continue to adjust based on patient's symptom burden.                                 - Continue IV Haldol 1-2 mg every 4 hours as needed. Continue to adjust based on patient's symptom burden.                   -Secretions, in the setting of end-of-life care                               - Continue IV glycopyrrolate   0.2 mg every 4 hours as needed.   # Psycho-social/Spiritual Support:  - Support System: Daughter-Brittany lives in [place]    # Discharge Planning:  Anticipated Hospital Death  Discussed with: daughter on phone.   Thank you for allowing the palliative care team to participate in the care Avelina ONEIDA Ok.  Mod MDM Lonia Serve MD.  Palliative Care Provider PMT # [phone]  If patient remains symptomatic despite maximum doses, please call PMT at [phone] between 0700 and 1900. Outside of these hours, please call attending, as PMT does not have night coverage.

## 2024-08-18 NOTE — Plan of Care (Signed)
  Problem: Clinical Measurements: Goal: Ability to maintain clinical measurements within normal limits will improve Outcome: Progressing   Problem: Activity: Goal: Risk for activity intolerance will decrease Outcome: Progressing   Problem: Coping: Goal: Level of anxiety will decrease Outcome: Progressing   Problem: Elimination: Goal: Will not experience complications related to bowel motility Outcome: Progressing   Problem: Pain Managment: Goal: General experience of comfort will improve and/or be controlled Outcome: Progressing   Problem: Safety: Goal: Ability to remain free from injury will improve Outcome: Progressing   Problem: Skin Integrity: Goal: Risk for impaired skin integrity will decrease Outcome: Progressing

## 2024-08-18 NOTE — Assessment & Plan Note (Signed)
 Metastatic disease at baseline Presented with septic shock Had progressive decline, transitioned to comfort care Anticipate in-hospital death Palliative care is consulting Comfort care order set utilized

## 2024-08-18 NOTE — Assessment & Plan Note (Signed)
 Wound 08/13/24 2015 Pressure Injury Sacrum Bilateral;Mid Stage 2 -  Partial thickness loss of dermis presenting as a shallow open injury with a red, pink wound bed without slough. (Active)  POA

## 2024-08-18 NOTE — Progress Notes (Signed)
 Progress Note   Patient: Brittany Marks FMW:[id number] DOB: [birth year] DOA: 08/13/2024     5 DOS: the patient was seen and examined on 08/18/2024   Brief hospital course: 83yo with h/o metastatic small cell lung CA, COP with chronic respiratory failure, CVA, and chronic HFrEF who presented from clinic on 11/6 with elevated LFTs.  She was noted to be hypotensive and transferred to ICU on pressors.  After palliative care consultation and goals of care discussions, she was transitioned to comfort measures and is expected to have an in-hospital death.    Assessment & Plan End of life care Small cell lung cancer, right upper lobe (HCC) DNR (do not resuscitate) Metastatic disease at baseline Presented with septic shock Had progressive decline, transitioned to comfort care Anticipate in-hospital death Palliative care is consulting Comfort care order set utilized Septic shock (HCC) Hypotension Present on admission Thought to be related to acute cholecystitis vs. UTI Was admitted to ICU for hypotension, was on norepinephrine and midodrine Started on IV antibiotics  With her continued decline she was subsequently transitioned to comfort measures only On benzos, antipsychotics and opiates for symptom management AKI (acute kidney injury) On presentation in the setting of septic shock Now on comfort care Chronic systolic CHF (congestive heart failure) (HCC) Elevated BNP noted on presentation Here with hypovolemia in the setting of septic shock Abnormal LFTs Present on admission Possibly related to shock liver in the setting of sepsis and hypotension Pressure injury Wound 08/13/24 2015 Pressure Injury Sacrum Bilateral;Mid Stage 2 -  Partial thickness loss of dermis presenting as a shallow open injury with a red, pink wound bed without slough. (Active)  POA      Consultants: PCCM Palliative care TOC team  Procedures: Echocardiogram 11/6  Antibiotics: Cefepime  x 1 Zosyn  11/7-8 Vancomycin  x 1  30 Day Unplanned Readmission Risk Score    Flowsheet Row ED to Hosp-Admission (Current) from 08/13/2024 in [facility]  30 Day Unplanned Readmission Risk Score (%) 25.27 Filed at 08/18/2024 0400    This score is the patient's risk of an unplanned readmission within 30 days of being discharged (0 -100%). The score is based on dignosis, age, lab data, medications, orders, and past utilization.   Low:  0-14.9   Medium: 15-21.9   High: 22-29.9   Extreme: 30 and above           Subjective: Obtunded, actively dying.   Objective: Vitals:   08/17/24 1300 08/18/24 0646  BP: (!) 87/63 (!) 76/53  Pulse: (!) 107 (!) 114  Resp: 16 15  Temp: 98.6 F (37 C) (!) 97.4 F (36.3 C)  SpO2: 99% 99%    Intake/Output Summary (Last 24 hours) at 08/18/2024 9341 Last data filed at 08/18/2024 0600 Gross per 24 hour  Intake 0 ml  Output --  Net 0 ml   Filed Weights   08/13/24 2015  Weight: 48.3 kg    Exam:  General:  Appears calm and comfortable, obtunded Eyes:  closed throughout ENT:  grossly normal lips & tongue; artificial dentition was unstable (I asked RN to remove) Cardiovascular:  RR with tachycardia. No LE edema.  Respiratory:   Coarse breath sounds.  Normal respiratory effort. Abdomen:  soft, NT, ND Skin:  no rash or induration seen on limited exam Musculoskeletal:  no bony abnormality Psychiatric:  obtunded Neurologic:  unable to effectively perform  Data Reviewed: I have reviewed the patient's lab results since admission.  Pertinent labs for today include:  None today     Family Communication: None present, palliative care speaking with family       Code Status: Do not attempt resuscitation (DNR) - Comfort care    Disposition: Status is: Inpatient Remains inpatient appropriate because: anticipate in-hospital demise     Time spent: 35 minutes  Unresulted Labs (From admission, onward)    None         Author: Delon Herald, MD 08/18/2024 6:58 AM  For on call review [email or web address].

## 2024-08-18 NOTE — Assessment & Plan Note (Signed)
 Present on admission Possibly related to shock liver in the setting of sepsis and hypotension

## 2024-08-18 NOTE — Assessment & Plan Note (Signed)
 Present on admission Thought to be related to acute cholecystitis vs. UTI Was admitted to ICU for hypotension, was on norepinephrine and midodrine Started on IV antibiotics  With her continued decline she was subsequently transitioned to comfort measures only On benzos, antipsychotics and opiates for symptom management

## 2024-08-18 NOTE — Hospital Course (Signed)
 83yo with h/o metastatic small cell lung CA, COP with chronic respiratory failure, CVA, and chronic HFrEF who presented from clinic on 11/6 with elevated LFTs.  She was noted to be hypotensive and transferred to ICU on pressors.  After palliative care consultation and goals of care discussions, she was transitioned to comfort measures and is expected to have an in-hospital death.

## 2024-08-18 NOTE — Assessment & Plan Note (Addendum)
 On presentation in the setting of septic shock Now on comfort care

## 2024-08-18 NOTE — Assessment & Plan Note (Addendum)
 Elevated BNP noted on presentation Here with hypovolemia in the setting of septic shock

## 2024-08-19 LAB — CULTURE, BLOOD (ROUTINE X 2)
Culture: NO GROWTH
Special Requests: ADEQUATE

## 2024-08-26 ENCOUNTER — Inpatient Hospital Stay: Admitting: Physician Assistant

## 2024-08-26 ENCOUNTER — Inpatient Hospital Stay

## 2024-09-07 NOTE — Assessment & Plan Note (Signed)
 Metastatic disease at baseline Presented with septic shock Had progressive decline, transitioned to comfort care Palliative care is consulting Comfort care order set utilized Underwent in-hospital death

## 2024-09-07 NOTE — Assessment & Plan Note (Signed)
 On presentation in the setting of septic shock Now on comfort care

## 2024-09-07 NOTE — Assessment & Plan Note (Signed)
 Elevated BNP noted on presentation Here with hypovolemia in the setting of septic shock

## 2024-09-07 NOTE — Assessment & Plan Note (Signed)
 Present on admission Thought to be related to acute cholecystitis vs. UTI Was admitted to ICU for hypotension, was on norepinephrine and midodrine Started on IV antibiotics  With her continued decline she was subsequently transitioned to comfort measures only On benzos, antipsychotics and opiates for symptom management

## 2024-09-07 NOTE — Assessment & Plan Note (Signed)
 Present on admission Possibly related to shock liver in the setting of sepsis and hypotension

## 2024-09-07 NOTE — Progress Notes (Signed)
 Checked on patient around 0200 and patient seemed comfortable, not struggling to breath, and sleeping. Contacted [facility] NP about Time of Death verified by 2 Rns at 0310. Daughter in the chart called and notified about time of death. Uncertain of the details at this moment, but will call back at a later time to discuss arrangements.

## 2024-09-07 NOTE — Assessment & Plan Note (Signed)
POA

## 2024-09-07 NOTE — Progress Notes (Signed)
    Patient Name: Brittany Marks           DOB: 09/21/1941  MRN: [id number]       OVERNIGHT EVENT    Notified by RN that patient has expired at 0310.  2 RN verified. Patient was comfort care.   No family at bedside.  Family is being notified by RN.    Casy Tavano, DNP, ACNPC- AG [facility] [place]

## 2024-09-07 NOTE — Death Summary Note (Signed)
 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, metastatic  Hospital Diagnoses: Principal Problem:   AKI (acute kidney injury) Active Problems:   Acute CVA (cerebrovascular accident) (HCC)   Small cell lung cancer, right upper lobe (HCC)   Chronic systolic CHF (congestive heart failure) (HCC)   Pressure injury   Hypotension   High risk medication use   Agitation   Pain   Abnormal LFTs   Malignant neoplasm of left lung (HCC)   DNR (do not resuscitate)   ACP (advance care planning)   Counseling and coordination of care   Medication management   Palliative care encounter   End of life care   Coordination of complex care   Septic shock [facility])   Hospital Course: 83yo with h/o metastatic small cell lung CA, COP with chronic respiratory failure, CVA, and chronic HFrEF who presented from clinic on 2024-09-10 with elevated LFTs.  She was noted to be hypotensive and transferred to ICU on pressors.  After palliative care consultation and goals of care discussions, she was transitioned to comfort measures and is expected to have an in-hospital death.  Assessment and Plan:  Assessment & Plan End of life care Small cell lung cancer, right upper lobe (HCC) DNR (do not resuscitate) Metastatic disease at baseline Presented with septic shock Had progressive decline, transitioned to comfort care Palliative care is consulting Comfort care order set utilized Underwent in-hospital death Septic shock (HCC) Hypotension Present on admission Thought to be related to acute cholecystitis vs. UTI Was admitted to ICU for hypotension, was on norepinephrine and midodrine Started on IV antibiotics  With her continued decline  she was subsequently transitioned to comfort measures only On benzos, antipsychotics and opiates for symptom management AKI (acute kidney injury) On presentation in the setting of septic shock Now on comfort care Chronic systolic CHF (congestive heart failure) (HCC) Elevated BNP noted on presentation Here with hypovolemia in the setting of septic shock Abnormal LFTs Present on admission Possibly related to shock liver in the setting of sepsis and hypotension Pressure injury   POA      Consultants: PCCM Palliative care TOC team   Procedures: Echocardiogram 09/10/2024   Antibiotics: Cefepime  x 1 Zosyn 11/7-8 Vancomycin  x 1  The results of significant diagnostics from this hospitalization (including imaging, microbiology, ancillary and laboratory) are listed below for reference.   Significant Diagnostic Studies: ECHOCARDIOGRAM LIMITED Result Date: 08/14/2024    ECHOCARDIOGRAM LIMITED REPORT   Patient Name:   Brittany Marks Date of Exam: 08/14/2024 Medical Rec #:  [id number]         Height:       62.0 in Accession #:    [id number]        Weight:       106.5 lb Date of Birth:  11-10-1940         BSA:          1.462 m Patient Age:    83 years          BP:           110/49 mmHg Patient Gender: F                 HR:  87 bpm. Exam Location:  Inpatient Procedure: Limited Echo, Cardiac Doppler and Color Doppler (Both Spectral and            Color Flow Doppler were utilized during procedure). Indications:    CHF  History:        Patient has prior history of Echocardiogram examinations, most                 recent 12/08/2023. CHF. Cancer.  Sonographer:    Philomena Daring Referring Phys: [id number] Brittany E Marks IMPRESSIONS  1. Limited Echocardiogram.  2. Left ventricular ejection fraction, by estimation, is 50 to 55%. The left ventricle has low normal function. The left ventricle has no regional wall motion abnormalities. Left ventricular diastolic parameters are consistent with Grade II diastolic  dysfunction (pseudonormalization). Elevated left atrial pressure. The E/e' is 18.5.  3. Right ventricular systolic function is mildly reduced. The right ventricular size is normal. There is moderately elevated pulmonary artery systolic pressure. The estimated right ventricular systolic pressure is 47.3 mmHg.  4. Left atrial size was grossly normal.  5. Right atrial size was grossly normal.  6. The mitral valve is degenerative. Mild mitral valve regurgitation. No evidence of mitral stenosis.  7. The aortic valve is tricuspid. Aortic valve regurgitation is not visualized. Aortic valve sclerosis is present, with no evidence of aortic valve stenosis.  8. The inferior vena cava is dilated in size with <50% respiratory variability, suggesting right atrial pressure of 15 mmHg. Comparison(s): A prior study was performed on 12/08/2023. LVEF 40-45%, Grade II diastolic dysfunction, LAE severe, mild MR, estimated RAP 3 mmHG. FINDINGS  Left Ventricle: Left ventricular ejection fraction, by estimation, is 50 to 55%. The left ventricle has low normal function. The left ventricle has no regional wall motion abnormalities. The left ventricular internal cavity size was normal in size. There is no left ventricular hypertrophy. Left ventricular diastolic parameters are consistent with Grade II diastolic dysfunction (pseudonormalization). Elevated left atrial pressure. The E/e' is 18.5. Right Ventricle: The right ventricular size is normal. Right ventricular systolic function is mildly reduced. There is moderately elevated pulmonary artery systolic pressure. The tricuspid regurgitant velocity is 2.84 m/s, and with an assumed right atrial pressure of 15 mmHg, the estimated right ventricular systolic pressure is 47.3 mmHg. Left Atrium: Left atrial size was grossly normal. Right Atrium: Right atrial size was grossly normal. Pericardium: There is no evidence of pericardial effusion. Mitral Valve: The mitral valve is degenerative in  appearance. Mild mitral valve regurgitation. No evidence of mitral valve stenosis. Tricuspid Valve: The tricuspid valve is grossly normal. Tricuspid valve regurgitation is mild. Aortic Valve: The aortic valve is tricuspid. Aortic valve regurgitation is not visualized. Aortic valve sclerosis is present, with no evidence of aortic valve stenosis. Pulmonic Valve: The pulmonic valve was grossly normal. Pulmonic valve regurgitation is not visualized. No evidence of pulmonic stenosis. Aorta: The aortic root and ascending aorta are structurally normal, with no evidence of dilitation. Venous: The inferior vena cava is dilated in size with less than 50% respiratory variability, suggesting right atrial pressure of 15 mmHg. Additional Comments: Spectral Doppler performed. Color Doppler performed.  LEFT VENTRICLE PLAX 2D LVIDd:         4.60 cm   Diastology LVIDs:         3.70 cm   LV e' medial:    5.98 cm/s LV PW:         1.00 cm   LV E/e' medial:  23.6 LV IVS:  1.00 cm   LV e' lateral:   10.60 cm/s LVOT diam:     2.00 cm   LV E/e' lateral: 13.3 LV SV:         51 LV SV Index:   35 LVOT Area:     3.14 cm  RIGHT VENTRICLE            IVC RV S prime:     8.27 cm/s  IVC diam: 2.30 cm TAPSE (M-mode): 1.6 cm LEFT ATRIUM         Index LA diam:    4.10 cm 2.80 cm/m  AORTIC VALVE LVOT Vmax:   88.60 cm/s LVOT Vmean:  56.800 cm/s LVOT VTI:    0.163 m  AORTA Ao Root diam: 2.70 cm Ao Asc diam:  3.10 cm MITRAL VALVE                TRICUSPID VALVE MV Area (PHT): 4.08 cm     TR Peak grad:   32.3 mmHg MV Decel Time: 186 msec     TR Vmax:        284.00 cm/s MV E velocity: 141.00 cm/s MV A velocity: 117.00 cm/s  SHUNTS MV E/A ratio:  1.21         Systemic VTI:  0.16 m                             Systemic Diam: 2.00 cm Sunit Tolia Electronically signed by Madonna Large Signature Date/Time: 08/14/2024/8:57:49 PM    Final    US  Abdomen Limited RUQ (LIVER/GB) Result Date: 08/13/2024 EXAM: Right Upper Quadrant Abdominal Ultrasound 08/13/2024  06:06:07 PM TECHNIQUE: Real-time ultrasonography of the right upper quadrant of the abdomen was performed. COMPARISON: 06/05/2013. CLINICAL HISTORY: 221910 Elevated LFTs 221910 Elevated LFTs. FINDINGS: LIVER: The liver demonstrates normal echogenicity. No intrahepatic biliary ductal dilatation. No evidence of mass. BILIARY SYSTEM: Circumferential wall thickening of the gallbladder with wall edema, measuring up to 9 mm. Small volume biliary sludge with punctate gallstones in the gallbladder neck. Negative sonographic Murphy's sign. Common bile duct is within normal limits measuring  3 mm . RIGHT KIDNEY: Similar fluid-filled dilation of the extrarenal pelvis, which is chronic. No hydronephrosis. OTHER: No right upper quadrant ascites. IMPRESSION: 1. Small volume biliary sludge with a couple of punctate gallstones in the gallbladder neck. Circumferential wall thickening with wall edema. In the absence of a sonographic Murphy's sign and pericholecystic fluid, these findings are equivocal for acute cholecystitis. If there is further clinical concern, a nonemergent hepatobiliary scan should be considered further characterization. Electronically signed by: Rogelia Myers MD 08/13/2024 06:44 PM EST RP Workstation: [id number]   DG Chest Port 1 View Result Date: 08/13/2024 CLINICAL DATA:  Questionable sepsis EXAM: PORTABLE CHEST 1 VIEW COMPARISON:  05/27/2024 FINDINGS: Single frontal view of the chest demonstrates right chest wall port tip overlying superior vena cava. The cardiac silhouette is unremarkable. Chronic areas of scarring and fibrosis are again seen throughout the lungs. Chronic left upper lobe cavitary lesion is unchanged since recent CT. No new airspace disease, effusion, or pneumothorax. No acute bony abnormalities. Chronic nonunion proximal right humeral fracture. Unremarkable left shoulder arthroplasty. IMPRESSION: 1. Persistent left upper lobe cavitary lesion, not appreciably changed since prior CT. 2. No  new airspace disease. 3. Chronic background scarring and fibrosis. Electronically Signed   By: Ozell Daring M.D.   On: 08/13/2024 15:50    Microbiology: Recent Results (from the past 240 hours)  Blood Culture (routine x 2)     Status: None   Collection Time: 08/13/24  3:17 PM   Specimen: BLOOD RIGHT HAND  Result Value Ref Range Status   Specimen Description   Final    BLOOD RIGHT HAND Performed at [facility], [address] [place], [place] [address]    Special Requests   Final    BOTTLES DRAWN AEROBIC ONLY Blood Culture results may not be optimal due to an inadequate volume of blood received in culture bottles Performed at [facility], [address] [place], [place] [address]    Culture   Final    NO GROWTH 5 DAYS Performed at [facility], [address], [place], [place] [address]    Report Status 08/18/2024 FINAL  Final  Urine Culture     Status: Abnormal   Collection Time: 08/13/24  4:10 PM   Specimen: Urine, Catheterized  Result Value Ref Range Status   Specimen Description   Final    URINE, CATHETERIZED Performed at [facility], [address], [place], [place] [address]    Special Requests   Final    NONE Reflexed from [id number] Performed at [facility], [address], [place], [place] [address]    Culture (A)  Final    >=100,000 COLONIES/mL AEROCOCCUS URINAE Standardized susceptibility testing for this organism is not available. Performed at [facility], [address] [place], [place] [address]    Report Status 08/15/2024 FINAL  Final  MRSA Next Gen by PCR, Nasal     Status: None   Collection Time: 08/13/24  8:36 PM   Specimen: Nasal Mucosa; Nasal Swab  Result Value Ref Range Status   MRSA by PCR Next Gen NOT DETECTED NOT DETECTED Final    Comment: (NOTE) The GeneXpert MRSA Assay (FDA approved for NASAL specimens only), is one component of a comprehensive MRSA colonization  surveillance program. It is not intended to diagnose MRSA infection nor to guide or monitor treatment for MRSA infections. Test performance is not FDA approved in patients less than 60 years old. Performed at [facility], [address], [place], [place] [address]   Blood Culture (routine x 2)     Status: None   Collection Time: 08/13/24  9:00 PM   Specimen: BLOOD  Result Value Ref Range Status   Specimen Description   Final    BLOOD RIGHT ANTECUBITAL Performed at [facility], [address], [place], [place] [address]    Special Requests   Final    Blood Culture adequate volume BOTTLES DRAWN AEROBIC AND ANAEROBIC Performed at [facility], [address] [place], [place] [address]    Culture   Final    NO GROWTH 5 DAYS Performed at [facility], [address], [place], [place] [address]    Report Status Aug 28, 2024 FINAL  Final    Signed: Delon Herald, MD 08/28/24

## 2024-09-07 DEATH — deceased

## 2024-09-08 ENCOUNTER — Inpatient Hospital Stay

## 2024-09-08 ENCOUNTER — Inpatient Hospital Stay: Admitting: Physician Assistant

## 2024-09-10 ENCOUNTER — Inpatient Hospital Stay

## 2024-09-10 ENCOUNTER — Inpatient Hospital Stay: Admitting: Dietician

## 2024-09-24 ENCOUNTER — Inpatient Hospital Stay: Admitting: Dietician

## 2024-09-24 ENCOUNTER — Inpatient Hospital Stay

## 2024-09-24 ENCOUNTER — Inpatient Hospital Stay: Admitting: Internal Medicine

## 2024-10-06 ENCOUNTER — Inpatient Hospital Stay

## 2024-10-06 ENCOUNTER — Inpatient Hospital Stay: Admitting: Internal Medicine

## 2024-10-07 ENCOUNTER — Inpatient Hospital Stay: Admitting: Internal Medicine

## 2024-10-07 ENCOUNTER — Inpatient Hospital Stay

## 2024-10-22 ENCOUNTER — Inpatient Hospital Stay: Admitting: Internal Medicine

## 2024-10-22 ENCOUNTER — Inpatient Hospital Stay

## 2024-11-05 ENCOUNTER — Inpatient Hospital Stay

## 2024-11-05 ENCOUNTER — Inpatient Hospital Stay: Admitting: Internal Medicine
